# Patient Record
Sex: Female | Born: 1950
Health system: Southern US, Community
[De-identification: ages and names within clinical notes are randomized; demographics above are authoritative.]

## PROBLEM LIST (undated history)

## (undated) DIAGNOSIS — F32A Depression, unspecified: Secondary | ICD-10-CM

## (undated) DIAGNOSIS — K5900 Constipation, unspecified: Secondary | ICD-10-CM

## (undated) DIAGNOSIS — R5382 Chronic fatigue, unspecified: Secondary | ICD-10-CM

## (undated) DIAGNOSIS — K56609 Unspecified intestinal obstruction, unspecified as to partial versus complete obstruction: Secondary | ICD-10-CM

## (undated) DIAGNOSIS — F329 Major depressive disorder, single episode, unspecified: Secondary | ICD-10-CM

## (undated) DIAGNOSIS — M8580 Other specified disorders of bone density and structure, unspecified site: Secondary | ICD-10-CM

## (undated) DIAGNOSIS — M797 Fibromyalgia: Secondary | ICD-10-CM

## (undated) DIAGNOSIS — K219 Gastro-esophageal reflux disease without esophagitis: Secondary | ICD-10-CM

## (undated) DIAGNOSIS — K279 Peptic ulcer, site unspecified, unspecified as acute or chronic, without hemorrhage or perforation: Secondary | ICD-10-CM

## (undated) DIAGNOSIS — E785 Hyperlipidemia, unspecified: Secondary | ICD-10-CM

## (undated) HISTORY — DX: Gastro-esophageal reflux disease without esophagitis: K21.9

## (undated) HISTORY — PX: ABDOMINAL SURGERY: SHX537

## (undated) HISTORY — PX: EYE SURGERY: SHX253

## (undated) HISTORY — DX: Peptic ulcer, site unspecified, unspecified as acute or chronic, without hemorrhage or perforation: K27.9

## (undated) HISTORY — DX: Chronic fatigue, unspecified: R53.82

## (undated) HISTORY — DX: Major depressive disorder, single episode, unspecified: F32.9

## (undated) HISTORY — DX: Constipation, unspecified: K59.00

## (undated) HISTORY — DX: Other specified disorders of bone density and structure, unspecified site: M85.80

## (undated) HISTORY — DX: Hyperlipidemia, unspecified: E78.5

## (undated) HISTORY — DX: Depression, unspecified: F32.A

---

## 1984-03-14 HISTORY — PX: TOTAL ABDOMINAL HYSTERECTOMY W/ BILATERAL SALPINGOOPHORECTOMY: SHX83

## 1997-03-14 HISTORY — PX: BREAST BIOPSY: SHX20

## 1997-07-23 ENCOUNTER — Ambulatory Visit (HOSPITAL_COMMUNITY): Admission: RE | Admit: 1997-07-23 | Discharge: 1997-07-23 | Payer: Self-pay | Admitting: *Deleted

## 1997-07-28 ENCOUNTER — Ambulatory Visit (HOSPITAL_COMMUNITY): Admission: RE | Admit: 1997-07-28 | Discharge: 1997-07-28 | Payer: Self-pay | Admitting: *Deleted

## 1997-08-05 ENCOUNTER — Ambulatory Visit (HOSPITAL_COMMUNITY): Admission: RE | Admit: 1997-08-05 | Discharge: 1997-08-05 | Payer: Self-pay | Admitting: *Deleted

## 1997-08-18 ENCOUNTER — Ambulatory Visit (HOSPITAL_COMMUNITY): Admission: RE | Admit: 1997-08-18 | Discharge: 1997-08-18 | Payer: Self-pay | Admitting: *Deleted

## 1997-08-18 ENCOUNTER — Other Ambulatory Visit: Admission: RE | Admit: 1997-08-18 | Discharge: 1997-08-18 | Payer: Self-pay | Admitting: *Deleted

## 1998-01-05 ENCOUNTER — Ambulatory Visit (HOSPITAL_COMMUNITY): Admission: RE | Admit: 1998-01-05 | Discharge: 1998-01-05 | Payer: Self-pay | Admitting: *Deleted

## 1998-02-17 ENCOUNTER — Ambulatory Visit (HOSPITAL_COMMUNITY): Admission: RE | Admit: 1998-02-17 | Discharge: 1998-02-17 | Payer: Self-pay | Admitting: *Deleted

## 1998-03-27 ENCOUNTER — Ambulatory Visit (HOSPITAL_COMMUNITY): Admission: RE | Admit: 1998-03-27 | Discharge: 1998-03-27 | Payer: Self-pay | Admitting: *Deleted

## 1998-08-31 ENCOUNTER — Ambulatory Visit (HOSPITAL_COMMUNITY): Admission: RE | Admit: 1998-08-31 | Discharge: 1998-08-31 | Payer: Self-pay | Admitting: *Deleted

## 1999-02-02 ENCOUNTER — Ambulatory Visit (HOSPITAL_COMMUNITY): Admission: RE | Admit: 1999-02-02 | Discharge: 1999-02-02 | Payer: Self-pay | Admitting: Internal Medicine

## 1999-02-02 ENCOUNTER — Encounter: Payer: Self-pay | Admitting: Internal Medicine

## 1999-05-13 ENCOUNTER — Ambulatory Visit (HOSPITAL_COMMUNITY): Admission: RE | Admit: 1999-05-13 | Discharge: 1999-05-13 | Payer: Self-pay | Admitting: *Deleted

## 1999-09-16 ENCOUNTER — Other Ambulatory Visit: Admission: RE | Admit: 1999-09-16 | Discharge: 1999-09-16 | Payer: Self-pay | Admitting: *Deleted

## 1999-09-29 ENCOUNTER — Ambulatory Visit (HOSPITAL_COMMUNITY): Admission: RE | Admit: 1999-09-29 | Discharge: 1999-09-29 | Payer: Self-pay | Admitting: *Deleted

## 2000-02-10 ENCOUNTER — Emergency Department (HOSPITAL_COMMUNITY): Admission: EM | Admit: 2000-02-10 | Discharge: 2000-02-11 | Payer: Self-pay | Admitting: Emergency Medicine

## 2000-02-11 ENCOUNTER — Encounter: Payer: Self-pay | Admitting: Emergency Medicine

## 2000-03-27 ENCOUNTER — Encounter: Payer: Self-pay | Admitting: Internal Medicine

## 2000-03-27 ENCOUNTER — Ambulatory Visit (HOSPITAL_COMMUNITY): Admission: RE | Admit: 2000-03-27 | Discharge: 2000-03-27 | Payer: Self-pay | Admitting: Internal Medicine

## 2000-06-20 ENCOUNTER — Encounter: Payer: Self-pay | Admitting: Emergency Medicine

## 2000-06-20 ENCOUNTER — Encounter: Payer: Self-pay | Admitting: *Deleted

## 2000-06-20 ENCOUNTER — Inpatient Hospital Stay (HOSPITAL_COMMUNITY): Admission: EM | Admit: 2000-06-20 | Discharge: 2000-06-21 | Payer: Self-pay | Admitting: Emergency Medicine

## 2000-06-30 ENCOUNTER — Encounter: Payer: Self-pay | Admitting: Internal Medicine

## 2000-06-30 ENCOUNTER — Encounter: Admission: RE | Admit: 2000-06-30 | Discharge: 2000-06-30 | Payer: Self-pay | Admitting: Internal Medicine

## 2000-12-22 ENCOUNTER — Emergency Department (HOSPITAL_COMMUNITY): Admission: EM | Admit: 2000-12-22 | Discharge: 2000-12-22 | Payer: Self-pay | Admitting: Emergency Medicine

## 2001-01-22 ENCOUNTER — Encounter: Payer: Self-pay | Admitting: Internal Medicine

## 2001-01-22 ENCOUNTER — Ambulatory Visit (HOSPITAL_COMMUNITY): Admission: RE | Admit: 2001-01-22 | Discharge: 2001-01-22 | Payer: Self-pay | Admitting: Internal Medicine

## 2001-03-14 HISTORY — PX: BREAST CYST ASPIRATION: SHX578

## 2001-03-22 ENCOUNTER — Encounter: Admission: RE | Admit: 2001-03-22 | Discharge: 2001-03-22 | Payer: Self-pay | Admitting: Internal Medicine

## 2001-03-22 ENCOUNTER — Encounter: Payer: Self-pay | Admitting: Internal Medicine

## 2001-06-07 ENCOUNTER — Ambulatory Visit (HOSPITAL_BASED_OUTPATIENT_CLINIC_OR_DEPARTMENT_OTHER): Admission: RE | Admit: 2001-06-07 | Discharge: 2001-06-07 | Payer: Self-pay | Admitting: Internal Medicine

## 2001-07-16 ENCOUNTER — Encounter: Admission: RE | Admit: 2001-07-16 | Discharge: 2001-07-16 | Payer: Self-pay | Admitting: Internal Medicine

## 2001-07-16 ENCOUNTER — Other Ambulatory Visit: Admission: RE | Admit: 2001-07-16 | Discharge: 2001-07-16 | Payer: Self-pay | Admitting: Radiology

## 2001-07-16 ENCOUNTER — Encounter (INDEPENDENT_AMBULATORY_CARE_PROVIDER_SITE_OTHER): Payer: Self-pay | Admitting: *Deleted

## 2001-07-16 ENCOUNTER — Encounter: Payer: Self-pay | Admitting: Internal Medicine

## 2001-12-06 ENCOUNTER — Encounter: Payer: Self-pay | Admitting: Internal Medicine

## 2001-12-06 ENCOUNTER — Ambulatory Visit (HOSPITAL_COMMUNITY): Admission: RE | Admit: 2001-12-06 | Discharge: 2001-12-06 | Payer: Self-pay | Admitting: Internal Medicine

## 2002-03-14 HISTORY — PX: CHOLECYSTECTOMY: SHX55

## 2002-05-03 ENCOUNTER — Ambulatory Visit (HOSPITAL_COMMUNITY): Admission: RE | Admit: 2002-05-03 | Discharge: 2002-05-03 | Payer: Self-pay | Admitting: Internal Medicine

## 2002-05-03 ENCOUNTER — Encounter: Payer: Self-pay | Admitting: Internal Medicine

## 2002-05-20 ENCOUNTER — Encounter: Payer: Self-pay | Admitting: General Surgery

## 2002-05-22 ENCOUNTER — Ambulatory Visit (HOSPITAL_COMMUNITY): Admission: RE | Admit: 2002-05-22 | Discharge: 2002-05-23 | Payer: Self-pay | Admitting: General Surgery

## 2002-05-22 ENCOUNTER — Encounter: Payer: Self-pay | Admitting: General Surgery

## 2002-05-22 ENCOUNTER — Encounter (INDEPENDENT_AMBULATORY_CARE_PROVIDER_SITE_OTHER): Payer: Self-pay | Admitting: *Deleted

## 2002-06-25 ENCOUNTER — Ambulatory Visit (HOSPITAL_COMMUNITY): Admission: RE | Admit: 2002-06-25 | Discharge: 2002-06-25 | Payer: Self-pay | Admitting: Internal Medicine

## 2002-06-25 ENCOUNTER — Encounter: Payer: Self-pay | Admitting: Internal Medicine

## 2002-08-05 ENCOUNTER — Encounter: Admission: RE | Admit: 2002-08-05 | Discharge: 2002-08-05 | Payer: Self-pay | Admitting: Internal Medicine

## 2002-08-05 ENCOUNTER — Encounter: Payer: Self-pay | Admitting: Internal Medicine

## 2002-12-05 ENCOUNTER — Encounter: Admission: RE | Admit: 2002-12-05 | Discharge: 2002-12-05 | Payer: Self-pay | Admitting: *Deleted

## 2002-12-05 ENCOUNTER — Encounter: Payer: Self-pay | Admitting: *Deleted

## 2002-12-12 ENCOUNTER — Ambulatory Visit (HOSPITAL_COMMUNITY): Admission: RE | Admit: 2002-12-12 | Discharge: 2002-12-12 | Payer: Self-pay | Admitting: Internal Medicine

## 2002-12-12 ENCOUNTER — Encounter: Payer: Self-pay | Admitting: Internal Medicine

## 2003-01-27 ENCOUNTER — Encounter: Payer: Self-pay | Admitting: Internal Medicine

## 2003-01-27 ENCOUNTER — Ambulatory Visit (HOSPITAL_COMMUNITY): Admission: RE | Admit: 2003-01-27 | Discharge: 2003-01-27 | Payer: Self-pay | Admitting: Gastroenterology

## 2003-01-27 LAB — HM COLONOSCOPY: HM Colonoscopy: NORMAL

## 2003-03-15 HISTORY — PX: APPENDECTOMY: SHX54

## 2003-05-30 ENCOUNTER — Emergency Department (HOSPITAL_COMMUNITY): Admission: EM | Admit: 2003-05-30 | Discharge: 2003-05-30 | Payer: Self-pay | Admitting: Emergency Medicine

## 2003-06-17 ENCOUNTER — Encounter: Admission: RE | Admit: 2003-06-17 | Discharge: 2003-06-17 | Payer: Self-pay | Admitting: Gastroenterology

## 2003-06-19 ENCOUNTER — Ambulatory Visit (HOSPITAL_COMMUNITY): Admission: RE | Admit: 2003-06-19 | Discharge: 2003-06-19 | Payer: Self-pay | Admitting: Internal Medicine

## 2003-08-07 ENCOUNTER — Encounter: Admission: RE | Admit: 2003-08-07 | Discharge: 2003-08-07 | Payer: Self-pay | Admitting: Internal Medicine

## 2003-12-25 ENCOUNTER — Encounter: Admission: RE | Admit: 2003-12-25 | Discharge: 2003-12-25 | Payer: Self-pay | Admitting: Gastroenterology

## 2004-01-06 ENCOUNTER — Ambulatory Visit (HOSPITAL_COMMUNITY): Admission: RE | Admit: 2004-01-06 | Discharge: 2004-01-06 | Payer: Self-pay | Admitting: Gastroenterology

## 2004-01-08 ENCOUNTER — Encounter: Admission: RE | Admit: 2004-01-08 | Discharge: 2004-01-08 | Payer: Self-pay | Admitting: Gastroenterology

## 2004-02-13 ENCOUNTER — Ambulatory Visit: Admission: RE | Admit: 2004-02-13 | Discharge: 2004-02-13 | Payer: Self-pay | Admitting: General Surgery

## 2004-03-17 ENCOUNTER — Inpatient Hospital Stay (HOSPITAL_COMMUNITY): Admission: RE | Admit: 2004-03-17 | Discharge: 2004-03-20 | Payer: Self-pay | Admitting: General Surgery

## 2004-03-17 ENCOUNTER — Encounter (INDEPENDENT_AMBULATORY_CARE_PROVIDER_SITE_OTHER): Payer: Self-pay | Admitting: Specialist

## 2004-04-14 ENCOUNTER — Encounter
Admission: RE | Admit: 2004-04-14 | Discharge: 2004-07-13 | Payer: Self-pay | Admitting: Physical Medicine & Rehabilitation

## 2004-04-16 ENCOUNTER — Ambulatory Visit: Payer: Self-pay | Admitting: Physical Medicine & Rehabilitation

## 2004-05-17 ENCOUNTER — Encounter
Admission: RE | Admit: 2004-05-17 | Discharge: 2004-06-01 | Payer: Self-pay | Admitting: Physical Medicine & Rehabilitation

## 2004-07-01 ENCOUNTER — Encounter: Admission: RE | Admit: 2004-07-01 | Discharge: 2004-07-01 | Payer: Self-pay | Admitting: General Surgery

## 2004-08-06 ENCOUNTER — Encounter
Admission: RE | Admit: 2004-08-06 | Discharge: 2004-11-04 | Payer: Self-pay | Admitting: Physical Medicine & Rehabilitation

## 2004-08-10 ENCOUNTER — Ambulatory Visit: Payer: Self-pay | Admitting: Physical Medicine & Rehabilitation

## 2004-08-17 ENCOUNTER — Encounter: Admission: RE | Admit: 2004-08-17 | Discharge: 2004-08-17 | Payer: Self-pay | Admitting: Family Medicine

## 2005-06-14 ENCOUNTER — Encounter: Admission: RE | Admit: 2005-06-14 | Discharge: 2005-06-14 | Payer: Self-pay | Admitting: Gastroenterology

## 2005-06-16 ENCOUNTER — Inpatient Hospital Stay (HOSPITAL_COMMUNITY): Admission: EM | Admit: 2005-06-16 | Discharge: 2005-06-18 | Payer: Self-pay | Admitting: Emergency Medicine

## 2005-08-22 ENCOUNTER — Ambulatory Visit: Payer: Self-pay | Admitting: Family Medicine

## 2005-09-07 ENCOUNTER — Encounter: Admission: RE | Admit: 2005-09-07 | Discharge: 2005-09-07 | Payer: Self-pay | Admitting: Family Medicine

## 2005-09-19 ENCOUNTER — Ambulatory Visit: Payer: Self-pay | Admitting: Family Medicine

## 2005-09-26 ENCOUNTER — Encounter: Admission: RE | Admit: 2005-09-26 | Discharge: 2005-09-26 | Payer: Self-pay | Admitting: Family Medicine

## 2005-09-30 ENCOUNTER — Ambulatory Visit (HOSPITAL_COMMUNITY): Payer: Self-pay | Admitting: Psychiatry

## 2005-10-14 ENCOUNTER — Ambulatory Visit: Payer: Self-pay | Admitting: Family Medicine

## 2005-10-28 ENCOUNTER — Ambulatory Visit: Payer: Self-pay | Admitting: Family Medicine

## 2005-11-01 ENCOUNTER — Encounter: Payer: Self-pay | Admitting: Emergency Medicine

## 2005-11-01 ENCOUNTER — Inpatient Hospital Stay (HOSPITAL_COMMUNITY): Admission: AD | Admit: 2005-11-01 | Discharge: 2005-11-03 | Payer: Self-pay | Admitting: Family Medicine

## 2005-11-01 ENCOUNTER — Ambulatory Visit: Payer: Self-pay | Admitting: Family Medicine

## 2005-11-02 ENCOUNTER — Encounter: Payer: Self-pay | Admitting: Vascular Surgery

## 2005-11-09 ENCOUNTER — Ambulatory Visit (HOSPITAL_COMMUNITY): Payer: Self-pay | Admitting: Psychiatry

## 2005-11-17 ENCOUNTER — Ambulatory Visit: Payer: Self-pay | Admitting: Family Medicine

## 2005-11-29 ENCOUNTER — Ambulatory Visit: Payer: Self-pay | Admitting: Family Medicine

## 2005-11-30 ENCOUNTER — Ambulatory Visit: Payer: Self-pay | Admitting: Family Medicine

## 2005-12-06 ENCOUNTER — Ambulatory Visit: Payer: Self-pay | Admitting: Family Medicine

## 2005-12-07 ENCOUNTER — Ambulatory Visit (HOSPITAL_COMMUNITY): Payer: Self-pay | Admitting: Psychiatry

## 2005-12-19 ENCOUNTER — Ambulatory Visit: Payer: Self-pay | Admitting: Family Medicine

## 2005-12-20 DIAGNOSIS — E785 Hyperlipidemia, unspecified: Secondary | ICD-10-CM | POA: Insufficient documentation

## 2005-12-20 DIAGNOSIS — F410 Panic disorder [episodic paroxysmal anxiety] without agoraphobia: Secondary | ICD-10-CM | POA: Insufficient documentation

## 2005-12-20 DIAGNOSIS — J309 Allergic rhinitis, unspecified: Secondary | ICD-10-CM | POA: Insufficient documentation

## 2005-12-20 DIAGNOSIS — G43909 Migraine, unspecified, not intractable, without status migrainosus: Secondary | ICD-10-CM

## 2005-12-20 DIAGNOSIS — G43009 Migraine without aura, not intractable, without status migrainosus: Secondary | ICD-10-CM | POA: Insufficient documentation

## 2005-12-20 DIAGNOSIS — N951 Menopausal and female climacteric states: Secondary | ICD-10-CM | POA: Insufficient documentation

## 2006-01-04 ENCOUNTER — Ambulatory Visit (HOSPITAL_COMMUNITY): Payer: Self-pay | Admitting: Psychiatry

## 2006-01-13 ENCOUNTER — Ambulatory Visit: Payer: Self-pay | Admitting: Internal Medicine

## 2006-02-13 ENCOUNTER — Ambulatory Visit: Payer: Self-pay | Admitting: Internal Medicine

## 2006-04-12 ENCOUNTER — Ambulatory Visit: Payer: Self-pay | Admitting: Internal Medicine

## 2006-05-11 ENCOUNTER — Ambulatory Visit: Payer: Self-pay | Admitting: Family Medicine

## 2006-05-18 ENCOUNTER — Ambulatory Visit: Payer: Self-pay | Admitting: Internal Medicine

## 2006-05-19 LAB — CONVERTED CEMR LAB
ALT: 14 units/L (ref 0–40)
AST: 18 units/L (ref 0–37)
Albumin: 3.8 g/dL (ref 3.5–5.2)
Alkaline Phosphatase: 93 units/L (ref 39–117)
BUN: 11 mg/dL (ref 6–23)
Basophils Absolute: 0.1 10*3/uL (ref 0.0–0.1)
Basophils Relative: 1.1 % — ABNORMAL HIGH (ref 0.0–1.0)
Bilirubin, Direct: 0.1 mg/dL (ref 0.0–0.3)
CO2: 31 meq/L (ref 19–32)
Calcium: 9.9 mg/dL (ref 8.4–10.5)
Chloride: 102 meq/L (ref 96–112)
Creatinine, Ser: 0.7 mg/dL (ref 0.4–1.2)
Eosinophils Absolute: 0.2 10*3/uL (ref 0.0–0.6)
Eosinophils Relative: 2.4 % (ref 0.0–5.0)
GFR calc Af Amer: 111 mL/min
GFR calc non Af Amer: 92 mL/min
Glucose, Bld: 67 mg/dL — ABNORMAL LOW (ref 70–99)
HCT: 38.2 % (ref 36.0–46.0)
Hemoglobin: 13.5 g/dL (ref 12.0–15.0)
Lymphocytes Relative: 25.8 % (ref 12.0–46.0)
MCHC: 35.4 g/dL (ref 30.0–36.0)
MCV: 91.8 fL (ref 78.0–100.0)
Monocytes Absolute: 0.1 10*3/uL — ABNORMAL LOW (ref 0.2–0.7)
Monocytes Relative: 1.6 % — ABNORMAL LOW (ref 3.0–11.0)
Neutro Abs: 5.9 10*3/uL (ref 1.4–7.7)
Neutrophils Relative %: 69.1 % (ref 43.0–77.0)
Phosphorus: 4.3 mg/dL (ref 2.3–4.6)
Platelets: 247 10*3/uL (ref 150–400)
Potassium: 3.9 meq/L (ref 3.5–5.1)
RBC: 4.16 M/uL (ref 3.87–5.11)
RDW: 11.5 % (ref 11.5–14.6)
Sodium: 140 meq/L (ref 135–145)
TSH: 1.51 microintl units/mL (ref 0.35–5.50)
Total Bilirubin: 0.5 mg/dL (ref 0.3–1.2)
Total Protein: 7 g/dL (ref 6.0–8.3)
WBC: 8.5 10*3/uL (ref 4.5–10.5)

## 2006-05-23 ENCOUNTER — Ambulatory Visit: Payer: Self-pay | Admitting: Professional

## 2006-06-01 ENCOUNTER — Ambulatory Visit: Payer: Self-pay | Admitting: Professional

## 2006-06-01 ENCOUNTER — Ambulatory Visit: Payer: Self-pay | Admitting: Internal Medicine

## 2006-06-15 ENCOUNTER — Ambulatory Visit: Payer: Self-pay | Admitting: Professional

## 2006-06-28 ENCOUNTER — Ambulatory Visit: Payer: Self-pay | Admitting: Internal Medicine

## 2006-08-08 ENCOUNTER — Telehealth (INDEPENDENT_AMBULATORY_CARE_PROVIDER_SITE_OTHER): Payer: Self-pay | Admitting: *Deleted

## 2006-08-15 DIAGNOSIS — K219 Gastro-esophageal reflux disease without esophagitis: Secondary | ICD-10-CM | POA: Insufficient documentation

## 2006-08-15 DIAGNOSIS — R5383 Other fatigue: Secondary | ICD-10-CM

## 2006-08-15 DIAGNOSIS — G479 Sleep disorder, unspecified: Secondary | ICD-10-CM | POA: Insufficient documentation

## 2006-08-15 DIAGNOSIS — R5381 Other malaise: Secondary | ICD-10-CM | POA: Insufficient documentation

## 2006-08-15 DIAGNOSIS — M797 Fibromyalgia: Secondary | ICD-10-CM | POA: Insufficient documentation

## 2006-08-15 DIAGNOSIS — F39 Unspecified mood [affective] disorder: Secondary | ICD-10-CM | POA: Insufficient documentation

## 2006-08-16 ENCOUNTER — Ambulatory Visit: Payer: Self-pay | Admitting: Internal Medicine

## 2006-09-05 ENCOUNTER — Ambulatory Visit: Payer: Self-pay | Admitting: Internal Medicine

## 2006-09-28 ENCOUNTER — Encounter: Admission: RE | Admit: 2006-09-28 | Discharge: 2006-09-28 | Payer: Self-pay | Admitting: Internal Medicine

## 2006-10-02 ENCOUNTER — Encounter (INDEPENDENT_AMBULATORY_CARE_PROVIDER_SITE_OTHER): Payer: Self-pay | Admitting: *Deleted

## 2006-10-02 ENCOUNTER — Encounter
Admission: RE | Admit: 2006-10-02 | Discharge: 2006-10-31 | Payer: Self-pay | Admitting: Physical Medicine & Rehabilitation

## 2006-10-03 ENCOUNTER — Ambulatory Visit: Payer: Self-pay | Admitting: Physical Medicine & Rehabilitation

## 2006-10-05 ENCOUNTER — Ambulatory Visit (HOSPITAL_COMMUNITY)
Admission: RE | Admit: 2006-10-05 | Discharge: 2006-10-05 | Payer: Self-pay | Admitting: Physical Medicine & Rehabilitation

## 2006-10-05 ENCOUNTER — Encounter (INDEPENDENT_AMBULATORY_CARE_PROVIDER_SITE_OTHER): Payer: Self-pay | Admitting: *Deleted

## 2006-10-13 ENCOUNTER — Ambulatory Visit: Payer: Self-pay | Admitting: Internal Medicine

## 2006-10-26 ENCOUNTER — Encounter: Payer: Self-pay | Admitting: Internal Medicine

## 2006-11-20 ENCOUNTER — Ambulatory Visit: Payer: Self-pay | Admitting: Internal Medicine

## 2006-11-24 ENCOUNTER — Ambulatory Visit: Payer: Self-pay | Admitting: Internal Medicine

## 2006-11-24 ENCOUNTER — Telehealth: Payer: Self-pay | Admitting: Internal Medicine

## 2006-11-27 ENCOUNTER — Ambulatory Visit: Payer: Self-pay | Admitting: Internal Medicine

## 2006-12-04 ENCOUNTER — Encounter: Payer: Self-pay | Admitting: Internal Medicine

## 2006-12-04 ENCOUNTER — Ambulatory Visit: Payer: Self-pay

## 2006-12-13 ENCOUNTER — Telehealth (INDEPENDENT_AMBULATORY_CARE_PROVIDER_SITE_OTHER): Payer: Self-pay | Admitting: *Deleted

## 2006-12-15 ENCOUNTER — Ambulatory Visit: Payer: Self-pay | Admitting: Internal Medicine

## 2007-01-01 ENCOUNTER — Telehealth (INDEPENDENT_AMBULATORY_CARE_PROVIDER_SITE_OTHER): Payer: Self-pay | Admitting: *Deleted

## 2007-01-12 ENCOUNTER — Telehealth: Payer: Self-pay | Admitting: Internal Medicine

## 2007-02-12 ENCOUNTER — Telehealth (INDEPENDENT_AMBULATORY_CARE_PROVIDER_SITE_OTHER): Payer: Self-pay | Admitting: *Deleted

## 2007-02-19 ENCOUNTER — Ambulatory Visit: Payer: Self-pay | Admitting: Internal Medicine

## 2007-02-19 LAB — CONVERTED CEMR LAB
Bilirubin Urine: NEGATIVE
Blood in Urine, dipstick: NEGATIVE
Glucose, Urine, Semiquant: NEGATIVE
Ketones, urine, test strip: NEGATIVE
Nitrite: NEGATIVE
Protein, U semiquant: NEGATIVE
Specific Gravity, Urine: <1.005
Urobilinogen, UA: 0.2
WBC Urine, dipstick: NEGATIVE
Whiff Test: NEGATIVE
pH: 7

## 2007-02-22 ENCOUNTER — Telehealth: Payer: Self-pay | Admitting: Internal Medicine

## 2007-03-12 ENCOUNTER — Telehealth: Payer: Self-pay | Admitting: Family Medicine

## 2007-04-12 ENCOUNTER — Telehealth (INDEPENDENT_AMBULATORY_CARE_PROVIDER_SITE_OTHER): Payer: Self-pay | Admitting: *Deleted

## 2007-04-23 ENCOUNTER — Telehealth (INDEPENDENT_AMBULATORY_CARE_PROVIDER_SITE_OTHER): Payer: Self-pay | Admitting: *Deleted

## 2007-05-16 ENCOUNTER — Telehealth (INDEPENDENT_AMBULATORY_CARE_PROVIDER_SITE_OTHER): Payer: Self-pay | Admitting: *Deleted

## 2007-05-28 ENCOUNTER — Ambulatory Visit: Payer: Self-pay | Admitting: Internal Medicine

## 2007-06-01 ENCOUNTER — Telehealth (INDEPENDENT_AMBULATORY_CARE_PROVIDER_SITE_OTHER): Payer: Self-pay | Admitting: *Deleted

## 2007-06-15 ENCOUNTER — Telehealth (INDEPENDENT_AMBULATORY_CARE_PROVIDER_SITE_OTHER): Payer: Self-pay | Admitting: *Deleted

## 2007-07-11 ENCOUNTER — Telehealth (INDEPENDENT_AMBULATORY_CARE_PROVIDER_SITE_OTHER): Payer: Self-pay | Admitting: *Deleted

## 2007-07-13 ENCOUNTER — Telehealth (INDEPENDENT_AMBULATORY_CARE_PROVIDER_SITE_OTHER): Payer: Self-pay | Admitting: *Deleted

## 2007-07-13 ENCOUNTER — Ambulatory Visit: Payer: Self-pay | Admitting: Internal Medicine

## 2007-08-02 ENCOUNTER — Encounter: Payer: Self-pay | Admitting: Internal Medicine

## 2007-08-13 ENCOUNTER — Telehealth (INDEPENDENT_AMBULATORY_CARE_PROVIDER_SITE_OTHER): Payer: Self-pay | Admitting: *Deleted

## 2007-08-24 ENCOUNTER — Telehealth: Payer: Self-pay | Admitting: Family Medicine

## 2007-08-30 ENCOUNTER — Ambulatory Visit: Payer: Self-pay | Admitting: Internal Medicine

## 2007-09-03 ENCOUNTER — Ambulatory Visit: Payer: Self-pay | Admitting: Family Medicine

## 2007-09-03 LAB — CONVERTED CEMR LAB
Albumin: 3.7 g/dL (ref 3.5–5.2)
BUN: 7 mg/dL (ref 6–23)
Basophils Absolute: 0 10*3/uL (ref 0.0–0.1)
Basophils Relative: 0.3 % (ref 0.0–1.0)
CO2: 29 meq/L (ref 19–32)
Calcium: 9.8 mg/dL (ref 8.4–10.5)
Chloride: 105 meq/L (ref 96–112)
Cholesterol: 209 mg/dL (ref 0–200)
Creatinine, Ser: 0.7 mg/dL (ref 0.4–1.2)
Direct LDL: 138.4 mg/dL
Eosinophils Absolute: 0 10*3/uL (ref 0.0–0.7)
Eosinophils Relative: 0.1 % (ref 0.0–5.0)
GFR calc Af Amer: 110 mL/min
GFR calc non Af Amer: 91 mL/min
Glucose, Bld: 121 mg/dL — ABNORMAL HIGH (ref 70–99)
HCT: 35.6 % — ABNORMAL LOW (ref 36.0–46.0)
HDL: 53.7 mg/dL (ref >39.0)
Hemoglobin: 12.5 g/dL (ref 12.0–15.0)
Lymphocytes Relative: 4.4 % — ABNORMAL LOW (ref 12.0–46.0)
MCHC: 35 g/dL (ref 30.0–36.0)
MCV: 98.5 fL (ref 78.0–100.0)
Monocytes Absolute: 0.4 10*3/uL (ref 0.1–1.0)
Monocytes Relative: 2.9 % — ABNORMAL LOW (ref 3.0–12.0)
Neutro Abs: 12 10*3/uL — ABNORMAL HIGH (ref 1.4–7.7)
Neutrophils Relative %: 92.3 % — ABNORMAL HIGH (ref 43.0–77.0)
Phosphorus: 2.7 mg/dL (ref 2.3–4.6)
Platelets: 300 10*3/uL (ref 150–400)
Potassium: 4.5 meq/L (ref 3.5–5.1)
RBC: 3.61 M/uL — ABNORMAL LOW (ref 3.87–5.11)
RDW: 11.8 % (ref 11.5–14.6)
Sodium: 139 meq/L (ref 135–145)
TSH: 0.78 microintl units/mL (ref 0.35–5.50)
Total CHOL/HDL Ratio: 3.9
Triglycerides: 55 mg/dL (ref 0–149)
VLDL: 11 mg/dL (ref 0–40)
WBC: 13 10*3/uL — ABNORMAL HIGH (ref 4.5–10.5)

## 2007-09-07 ENCOUNTER — Telehealth: Payer: Self-pay | Admitting: Family Medicine

## 2007-09-11 ENCOUNTER — Telehealth (INDEPENDENT_AMBULATORY_CARE_PROVIDER_SITE_OTHER): Payer: Self-pay | Admitting: *Deleted

## 2007-10-01 ENCOUNTER — Encounter: Admission: RE | Admit: 2007-10-01 | Discharge: 2007-10-01 | Payer: Self-pay | Admitting: Internal Medicine

## 2007-10-02 ENCOUNTER — Encounter (INDEPENDENT_AMBULATORY_CARE_PROVIDER_SITE_OTHER): Payer: Self-pay | Admitting: *Deleted

## 2007-10-02 ENCOUNTER — Telehealth (INDEPENDENT_AMBULATORY_CARE_PROVIDER_SITE_OTHER): Payer: Self-pay | Admitting: *Deleted

## 2007-10-08 ENCOUNTER — Ambulatory Visit: Payer: Self-pay | Admitting: Internal Medicine

## 2007-10-08 LAB — CONVERTED CEMR LAB: RBC / HPF: 0

## 2007-10-09 ENCOUNTER — Telehealth (INDEPENDENT_AMBULATORY_CARE_PROVIDER_SITE_OTHER): Payer: Self-pay | Admitting: *Deleted

## 2007-10-30 ENCOUNTER — Telehealth: Payer: Self-pay | Admitting: Internal Medicine

## 2007-11-07 ENCOUNTER — Telehealth: Payer: Self-pay | Admitting: Internal Medicine

## 2007-11-26 ENCOUNTER — Telehealth: Payer: Self-pay | Admitting: Internal Medicine

## 2007-11-26 ENCOUNTER — Telehealth: Payer: Self-pay | Admitting: Family Medicine

## 2007-12-05 ENCOUNTER — Telehealth: Payer: Self-pay | Admitting: Internal Medicine

## 2007-12-27 ENCOUNTER — Telehealth: Payer: Self-pay | Admitting: Internal Medicine

## 2008-01-01 ENCOUNTER — Telehealth: Payer: Self-pay | Admitting: Internal Medicine

## 2008-01-11 ENCOUNTER — Ambulatory Visit: Payer: Self-pay | Admitting: Internal Medicine

## 2008-01-29 ENCOUNTER — Telehealth: Payer: Self-pay | Admitting: Internal Medicine

## 2008-01-31 ENCOUNTER — Telehealth: Payer: Self-pay | Admitting: Internal Medicine

## 2008-02-27 ENCOUNTER — Telehealth: Payer: Self-pay | Admitting: Internal Medicine

## 2008-02-28 ENCOUNTER — Telehealth: Payer: Self-pay | Admitting: Internal Medicine

## 2008-03-10 ENCOUNTER — Telehealth: Payer: Self-pay | Admitting: Internal Medicine

## 2008-03-25 ENCOUNTER — Telehealth: Payer: Self-pay | Admitting: Internal Medicine

## 2008-04-07 ENCOUNTER — Ambulatory Visit: Payer: Self-pay | Admitting: Internal Medicine

## 2008-04-08 LAB — CONVERTED CEMR LAB
ALT: 19 units/L (ref 0–35)
AST: 24 units/L (ref 0–37)
Albumin: 4.4 g/dL (ref 3.5–5.2)
Alkaline Phosphatase: 84 units/L (ref 39–117)
BUN: 12 mg/dL (ref 6–23)
Basophils Absolute: 0 10*3/uL (ref 0.0–0.1)
Basophils Relative: 0.4 % (ref 0.0–3.0)
Bilirubin, Direct: 0.1 mg/dL (ref 0.0–0.3)
CO2: 32 meq/L (ref 19–32)
Calcium: 10.1 mg/dL (ref 8.4–10.5)
Chloride: 107 meq/L (ref 96–112)
Creatinine, Ser: 0.8 mg/dL (ref 0.4–1.2)
Eosinophils Absolute: 0.2 10*3/uL (ref 0.0–0.7)
Eosinophils Relative: 2.7 % (ref 0.0–5.0)
GFR calc Af Amer: 94 mL/min
GFR calc non Af Amer: 78 mL/min
Glucose, Bld: 88 mg/dL (ref 70–99)
HCT: 39.2 % (ref 36.0–46.0)
Hemoglobin: 13.5 g/dL (ref 12.0–15.0)
Lymphocytes Relative: 38.6 % (ref 12.0–46.0)
MCHC: 34.4 g/dL (ref 30.0–36.0)
MCV: 96.2 fL (ref 78.0–100.0)
Monocytes Absolute: 0.4 10*3/uL (ref 0.1–1.0)
Monocytes Relative: 6 % (ref 3.0–12.0)
Neutro Abs: 3.8 10*3/uL (ref 1.4–7.7)
Neutrophils Relative %: 52.3 % (ref 43.0–77.0)
Phosphorus: 5 mg/dL — ABNORMAL HIGH (ref 2.3–4.6)
Platelets: 220 10*3/uL (ref 150–400)
Potassium: 4.2 meq/L (ref 3.5–5.1)
RBC: 4.08 M/uL (ref 3.87–5.11)
RDW: 11.6 % (ref 11.5–14.6)
Sed Rate: 15 mm/hr (ref 0–22)
Sodium: 143 meq/L (ref 135–145)
TSH: 1.74 microintl units/mL (ref 0.35–5.50)
Total Bilirubin: 0.7 mg/dL (ref 0.3–1.2)
Total Protein: 7.6 g/dL (ref 6.0–8.3)
WBC: 7.1 10*3/uL (ref 4.5–10.5)

## 2008-04-10 ENCOUNTER — Encounter: Payer: Self-pay | Admitting: Internal Medicine

## 2008-04-25 ENCOUNTER — Telehealth: Payer: Self-pay | Admitting: Internal Medicine

## 2008-04-30 ENCOUNTER — Emergency Department (HOSPITAL_COMMUNITY): Admission: EM | Admit: 2008-04-30 | Discharge: 2008-04-30 | Payer: Self-pay | Admitting: Emergency Medicine

## 2008-05-26 ENCOUNTER — Telehealth: Payer: Self-pay | Admitting: Family Medicine

## 2008-06-19 ENCOUNTER — Ambulatory Visit: Payer: Self-pay | Admitting: Internal Medicine

## 2008-06-27 ENCOUNTER — Telehealth: Payer: Self-pay | Admitting: Internal Medicine

## 2008-07-08 ENCOUNTER — Telehealth: Payer: Self-pay | Admitting: Internal Medicine

## 2008-07-10 ENCOUNTER — Ambulatory Visit: Payer: Self-pay | Admitting: Family Medicine

## 2008-07-15 ENCOUNTER — Telehealth: Payer: Self-pay | Admitting: Internal Medicine

## 2008-07-16 ENCOUNTER — Telehealth: Payer: Self-pay | Admitting: Internal Medicine

## 2008-07-17 ENCOUNTER — Ambulatory Visit: Payer: Self-pay | Admitting: Family Medicine

## 2008-07-24 ENCOUNTER — Telehealth: Payer: Self-pay | Admitting: Internal Medicine

## 2008-07-25 ENCOUNTER — Telehealth (INDEPENDENT_AMBULATORY_CARE_PROVIDER_SITE_OTHER): Payer: Self-pay | Admitting: *Deleted

## 2008-07-25 ENCOUNTER — Ambulatory Visit: Payer: Self-pay | Admitting: Family Medicine

## 2008-08-04 ENCOUNTER — Ambulatory Visit: Payer: Self-pay | Admitting: Family Medicine

## 2008-08-05 ENCOUNTER — Telehealth: Payer: Self-pay | Admitting: Family Medicine

## 2008-08-26 ENCOUNTER — Ambulatory Visit: Payer: Self-pay | Admitting: Internal Medicine

## 2008-08-27 ENCOUNTER — Telehealth: Payer: Self-pay | Admitting: Internal Medicine

## 2008-09-21 ENCOUNTER — Observation Stay (HOSPITAL_COMMUNITY): Admission: EM | Admit: 2008-09-21 | Discharge: 2008-09-22 | Payer: Self-pay | Admitting: Emergency Medicine

## 2008-09-22 ENCOUNTER — Ambulatory Visit: Payer: Self-pay | Admitting: *Deleted

## 2008-09-22 ENCOUNTER — Encounter: Payer: Self-pay | Admitting: Internal Medicine

## 2008-09-24 ENCOUNTER — Telehealth: Payer: Self-pay | Admitting: Internal Medicine

## 2008-10-02 ENCOUNTER — Encounter: Payer: Self-pay | Admitting: Internal Medicine

## 2008-10-02 ENCOUNTER — Encounter: Admission: RE | Admit: 2008-10-02 | Discharge: 2008-10-02 | Payer: Self-pay | Admitting: Family Medicine

## 2008-10-03 ENCOUNTER — Encounter (INDEPENDENT_AMBULATORY_CARE_PROVIDER_SITE_OTHER): Payer: Self-pay | Admitting: *Deleted

## 2008-10-23 ENCOUNTER — Telehealth: Payer: Self-pay | Admitting: Internal Medicine

## 2008-11-10 ENCOUNTER — Ambulatory Visit: Payer: Self-pay | Admitting: Internal Medicine

## 2008-11-11 LAB — CONVERTED CEMR LAB
ALT: 13 units/L (ref 0–35)
AST: 24 units/L (ref 0–37)
Albumin: 4.4 g/dL (ref 3.5–5.2)
Alkaline Phosphatase: 95 units/L (ref 39–117)
BUN: 10 mg/dL (ref 6–23)
Basophils Absolute: 0 10*3/uL (ref 0.0–0.1)
Basophils Relative: 0.5 % (ref 0.0–3.0)
Bilirubin, Direct: 0.1 mg/dL (ref 0.0–0.3)
CO2: 31 meq/L (ref 19–32)
Calcium: 9.7 mg/dL (ref 8.4–10.5)
Chloride: 103 meq/L (ref 96–112)
Creatinine, Ser: 0.8 mg/dL (ref 0.4–1.2)
Eosinophils Absolute: 0.2 10*3/uL (ref 0.0–0.7)
Eosinophils Relative: 3 % (ref 0.0–5.0)
Glucose, Bld: 92 mg/dL (ref 70–99)
HCT: 38.9 % (ref 36.0–46.0)
Hemoglobin: 13.7 g/dL (ref 12.0–15.0)
Lymphocytes Relative: 32.5 % (ref 12.0–46.0)
Lymphs Abs: 2.6 10*3/uL (ref 0.7–4.0)
MCHC: 35.3 g/dL (ref 30.0–36.0)
MCV: 95.8 fL (ref 78.0–100.0)
Monocytes Absolute: 0.5 10*3/uL (ref 0.1–1.0)
Monocytes Relative: 6.5 % (ref 3.0–12.0)
Neutro Abs: 4.7 10*3/uL (ref 1.4–7.7)
Neutrophils Relative %: 57.5 % (ref 43.0–77.0)
Phosphorus: 4.5 mg/dL (ref 2.3–4.6)
Platelets: 236 10*3/uL (ref 150.0–400.0)
Potassium: 4.2 meq/L (ref 3.5–5.1)
RBC: 4.07 M/uL (ref 3.87–5.11)
RDW: 11.6 % (ref 11.5–14.6)
Sed Rate: 21 mm/hr (ref 0–22)
Sodium: 140 meq/L (ref 135–145)
TSH: 1.2 microintl units/mL (ref 0.35–5.50)
Total Bilirubin: 0.9 mg/dL (ref 0.3–1.2)
Total Protein: 7.9 g/dL (ref 6.0–8.3)
WBC: 8 10*3/uL (ref 4.5–10.5)

## 2008-11-19 ENCOUNTER — Telehealth: Payer: Self-pay | Admitting: Internal Medicine

## 2008-11-27 ENCOUNTER — Telehealth: Payer: Self-pay | Admitting: Internal Medicine

## 2008-11-28 ENCOUNTER — Telehealth: Payer: Self-pay | Admitting: Internal Medicine

## 2008-12-25 ENCOUNTER — Telehealth: Payer: Self-pay | Admitting: Internal Medicine

## 2009-01-22 ENCOUNTER — Telehealth: Payer: Self-pay | Admitting: Internal Medicine

## 2009-02-09 ENCOUNTER — Telehealth: Payer: Self-pay | Admitting: Internal Medicine

## 2009-02-25 ENCOUNTER — Telehealth: Payer: Self-pay | Admitting: Internal Medicine

## 2009-03-03 ENCOUNTER — Telehealth: Payer: Self-pay | Admitting: Internal Medicine

## 2009-03-10 ENCOUNTER — Ambulatory Visit: Payer: Self-pay | Admitting: Internal Medicine

## 2009-03-10 DIAGNOSIS — M899 Disorder of bone, unspecified: Secondary | ICD-10-CM | POA: Insufficient documentation

## 2009-03-10 DIAGNOSIS — M949 Disorder of cartilage, unspecified: Secondary | ICD-10-CM

## 2009-03-26 ENCOUNTER — Telehealth: Payer: Self-pay | Admitting: Internal Medicine

## 2009-04-10 ENCOUNTER — Ambulatory Visit: Payer: Self-pay | Admitting: Family Medicine

## 2009-04-13 ENCOUNTER — Ambulatory Visit: Payer: Self-pay | Admitting: Occupational Medicine

## 2009-04-13 ENCOUNTER — Telehealth: Payer: Self-pay | Admitting: Internal Medicine

## 2009-04-20 ENCOUNTER — Ambulatory Visit: Payer: Self-pay | Admitting: Occupational Medicine

## 2009-04-24 ENCOUNTER — Telehealth: Payer: Self-pay | Admitting: Internal Medicine

## 2009-04-27 ENCOUNTER — Telehealth: Payer: Self-pay | Admitting: Internal Medicine

## 2009-04-30 ENCOUNTER — Ambulatory Visit: Payer: Self-pay | Admitting: Family Medicine

## 2009-05-06 ENCOUNTER — Encounter: Admission: RE | Admit: 2009-05-06 | Discharge: 2009-05-06 | Payer: Self-pay | Admitting: Sports Medicine

## 2009-05-11 ENCOUNTER — Encounter: Admission: RE | Admit: 2009-05-11 | Discharge: 2009-06-04 | Payer: Self-pay | Admitting: Sports Medicine

## 2009-05-25 ENCOUNTER — Telehealth: Payer: Self-pay | Admitting: Internal Medicine

## 2009-06-25 ENCOUNTER — Telehealth: Payer: Self-pay | Admitting: Internal Medicine

## 2009-06-26 ENCOUNTER — Ambulatory Visit: Payer: Self-pay | Admitting: Internal Medicine

## 2009-06-26 LAB — CONVERTED CEMR LAB
Bilirubin Urine: NEGATIVE
Glucose, Urine, Semiquant: NEGATIVE
Ketones, urine, test strip: NEGATIVE
Nitrite: NEGATIVE
Protein, U semiquant: NEGATIVE
Specific Gravity, Urine: <1.005
Urobilinogen, UA: 0.2
pH: 6

## 2009-07-24 ENCOUNTER — Telehealth: Payer: Self-pay | Admitting: Internal Medicine

## 2009-08-25 ENCOUNTER — Telehealth: Payer: Self-pay | Admitting: Internal Medicine

## 2009-09-01 ENCOUNTER — Encounter: Payer: Self-pay | Admitting: Internal Medicine

## 2009-09-24 ENCOUNTER — Telehealth: Payer: Self-pay | Admitting: Internal Medicine

## 2009-10-07 ENCOUNTER — Encounter: Admission: RE | Admit: 2009-10-07 | Discharge: 2009-10-07 | Payer: Self-pay | Admitting: Internal Medicine

## 2009-10-07 LAB — HM MAMMOGRAPHY

## 2009-10-08 ENCOUNTER — Encounter: Payer: Self-pay | Admitting: Internal Medicine

## 2009-10-08 ENCOUNTER — Encounter (INDEPENDENT_AMBULATORY_CARE_PROVIDER_SITE_OTHER): Payer: Self-pay | Admitting: *Deleted

## 2009-10-21 ENCOUNTER — Encounter: Payer: Self-pay | Admitting: Internal Medicine

## 2009-10-22 ENCOUNTER — Ambulatory Visit: Payer: Self-pay | Admitting: Internal Medicine

## 2009-11-12 ENCOUNTER — Encounter: Payer: Self-pay | Admitting: Internal Medicine

## 2009-11-24 ENCOUNTER — Telehealth: Payer: Self-pay | Admitting: Internal Medicine

## 2009-12-04 ENCOUNTER — Telehealth: Payer: Self-pay | Admitting: Internal Medicine

## 2009-12-23 ENCOUNTER — Telehealth: Payer: Self-pay | Admitting: Internal Medicine

## 2010-01-04 ENCOUNTER — Telehealth: Payer: Self-pay | Admitting: Internal Medicine

## 2010-01-26 ENCOUNTER — Telehealth: Payer: Self-pay | Admitting: Internal Medicine

## 2010-02-15 ENCOUNTER — Telehealth: Payer: Self-pay | Admitting: Internal Medicine

## 2010-02-15 ENCOUNTER — Ambulatory Visit: Payer: Self-pay | Admitting: Family Medicine

## 2010-02-17 ENCOUNTER — Telehealth (INDEPENDENT_AMBULATORY_CARE_PROVIDER_SITE_OTHER): Payer: Self-pay

## 2010-02-18 ENCOUNTER — Ambulatory Visit: Payer: Self-pay | Admitting: Internal Medicine

## 2010-02-23 ENCOUNTER — Telehealth: Payer: Self-pay | Admitting: Internal Medicine

## 2010-03-24 ENCOUNTER — Telehealth: Payer: Self-pay | Admitting: Internal Medicine

## 2010-03-25 ENCOUNTER — Ambulatory Visit
Admission: RE | Admit: 2010-03-25 | Discharge: 2010-03-25 | Payer: Self-pay | Source: Home / Self Care | Attending: Internal Medicine | Admitting: Internal Medicine

## 2010-03-25 ENCOUNTER — Other Ambulatory Visit: Payer: Self-pay | Admitting: Internal Medicine

## 2010-03-25 DIAGNOSIS — R42 Dizziness and giddiness: Secondary | ICD-10-CM | POA: Insufficient documentation

## 2010-03-26 LAB — CBC WITH DIFFERENTIAL/PLATELET
Basophils Absolute: 0 10*3/uL (ref 0.0–0.1)
Basophils Relative: 0.2 % (ref 0.0–3.0)
Eosinophils Absolute: 0.1 10*3/uL (ref 0.0–0.7)
Eosinophils Relative: 1.8 % (ref 0.0–5.0)
HCT: 37.6 % (ref 36.0–46.0)
Hemoglobin: 12.9 g/dL (ref 12.0–15.0)
Lymphocytes Relative: 33.3 % (ref 12.0–46.0)
Lymphs Abs: 2.2 10*3/uL (ref 0.7–4.0)
MCHC: 34.3 g/dL (ref 30.0–36.0)
MCV: 96.3 fl (ref 78.0–100.0)
Monocytes Absolute: 0 10*3/uL — ABNORMAL LOW (ref 0.1–1.0)
Monocytes Relative: 0.4 % — ABNORMAL LOW (ref 3.0–12.0)
Neutro Abs: 4.2 10*3/uL (ref 1.4–7.7)
Neutrophils Relative %: 64.3 % (ref 43.0–77.0)
Platelets: 254 10*3/uL (ref 150.0–400.0)
RBC: 3.91 Mil/uL (ref 3.87–5.11)
RDW: 12.6 % (ref 11.5–14.6)
WBC: 6.6 10*3/uL (ref 4.5–10.5)

## 2010-03-26 LAB — RENAL FUNCTION PANEL
Albumin: 3.9 g/dL (ref 3.5–5.2)
BUN: 11 mg/dL (ref 6–23)
CO2: 31 mEq/L (ref 19–32)
Calcium: 9.4 mg/dL (ref 8.4–10.5)
Chloride: 104 mEq/L (ref 96–112)
Creatinine, Ser: 0.7 mg/dL (ref 0.4–1.2)
GFR: 84.64 mL/min (ref >60.00)
Glucose, Bld: 76 mg/dL (ref 70–99)
Phosphorus: 3.7 mg/dL (ref 2.3–4.6)
Potassium: 4.4 mEq/L (ref 3.5–5.1)
Sodium: 142 mEq/L (ref 135–145)

## 2010-03-26 LAB — HEPATIC FUNCTION PANEL
ALT: 11 U/L (ref 0–35)
AST: 20 U/L (ref 0–37)
Albumin: 3.9 g/dL (ref 3.5–5.2)
Alkaline Phosphatase: 115 U/L (ref 39–117)
Bilirubin, Direct: 0 mg/dL (ref 0.0–0.3)
Total Bilirubin: 0.3 mg/dL (ref 0.3–1.2)
Total Protein: 6.6 g/dL (ref 6.0–8.3)

## 2010-03-26 LAB — TSH: TSH: 1.19 u[IU]/mL (ref 0.35–5.50)

## 2010-04-05 ENCOUNTER — Telehealth: Payer: Self-pay | Admitting: Internal Medicine

## 2010-04-13 NOTE — Progress Notes (Signed)
Summary: Rx Hydrocodone/APAP  Phone Note Refill Request Call back at [phone] Message from:  [facility] on April 24, 2009 9:46 AM  Refills Requested: Medication #1:  HYDROCODONE-ACETAMINOPHEN 5-500 MG TABS one pill every 12 hours as needed for pain..   Last Refilled: 03/26/2009 Received faxed refill request, please advise.  Forms in your IN box.   Method Requested: Telephone to Pharmacy Initial call taken by: Linde Gillis CMA [facility] Dull),  April 24, 2009 9:47 AM  Follow-up for Phone Call        too soon for this Got extra 4 days ago Can reconsider next week Follow-up by: Cindee Salt MD,  April 24, 2009 10:16 AM  Additional Follow-up for Phone Call Additional follow up Details #1::        left message on machine for patient to return call.  DeShannon Smith CMA [facility] Dull)  April 24, 2009 12:19 PM   patient called back and states that she did not get the rx filled from Dr. Kathrine Haddock because it was only for 15 tabs she didn't feel it was worth getting filled. Pt states that Dr. Alto Denver changed her rx to take 2  tabs every 4-6 hours. I told pt that she would have to follow-up with Dr. Alphonsus Sias because our rx has not changed. Pt wanted to know what she should do for her rx, should she call back next week for refill? DeShannon Smith CMA [facility] D[facility])  April 24, 2009 1:27 PM   She can call back next week and I will reevaluate. Let her know if she ever accepts a narcotic prescription from anyone else, i will never prescribe for her again!! Additional Follow-up by: Cindee Salt MD,  April 24, 2009 1:59 PM    Additional Follow-up for Phone Call Additional follow up Details #2::    Spoke with patient and advised results.  Follow-up by: Mervin Hack CMA [facility] Dull),  April 24, 2009 5:32 PM

## 2010-04-13 NOTE — Progress Notes (Signed)
Summary: neurontin / darvocet refill  Phone Note Refill Request Message from:  [facility] on November 26, 2007 4:00 PM  Refills Requested: Medication #1:  NEURONTIN 600 MG TABS 4 by mouth at bedtime  Medication #2:  DARVOCET A500 100-500 MG  TABS take 1 by mouth qid as needed pain   Last Ref[facility]: [id number]  ph # [phone] fax # [phone]  Initial call taken by: Cooper Render,  November 26, 2007 4:01 PM  Follow-up for Phone Call        darvocet #60 x 0  neurontin okay x 1 year (no fax here) Follow-up by: Cindee Salt MD,  November 26, 2007 5:37 PM  Additional Follow-up for Phone Call Additional follow up Details #1::        meds phoned to pharmacy. Additional Follow-up by: Cooper Render,  November 26, 2007 5:53 PM

## 2010-04-13 NOTE — Progress Notes (Signed)
Summary: Rx-Fioricet  Phone Note Refill Request Message from:  [phone] on Jul 17, 2008 9:28 AM  Refills Requested: Medication #1:  FIORICET 50-325-40 MG  TABS 2 at headache onset   Last Refilled: 05/26/2008 [facility] Request    Method Requested: Telephone to Pharmacy Initial call taken by: Mervin Hack CMA,  Jul 17, 2008 9:28 AM  Follow-up for Phone Call        okay #30 x 0 Follow-up by: Cindee Salt MD,  Jul 17, 2008 1:21 PM  Additional Follow-up for Phone Call Additional follow up Details #1::        Rx faxed to pharmacy Additional Follow-up by: Mervin Hack CMA,  Jul 17, 2008 2:03 PM      Prescriptions: FIORICET 50-325-40 MG  TABS [facility]) 2 at headache onset  #30 Tablet x 0   Entered by:   Mervin Hack CMA   Authorized by:   Cindee Salt MD   Signed by:   Mervin Hack CMA on 07/17/2008   Method used:   Electronically to        [facility] [phone]* (retail)       [address] [place], [place]  [address]       Ph: [phone] or [phone]       Fax: [phone]   RxID:   [id number]

## 2010-04-13 NOTE — Progress Notes (Signed)
SummaryEbony Ward  Phone Note Refill Request Message from:  [phone] on May 25, 2009 11:48 AM  Refills Requested: Medication #1:  XANAX 1 MG TABS Take 1 tablet by mouth four times a day   Last Refilled: 04/24/2009  Medication #2:  NORCO 5-325 MG TABS take 1 by mouth three times a day as needed   Last Refilled: 04/27/2009 Form on your desk    Method Requested: Fax to Local Pharmacy Initial call taken by: DeShannon Smith CMA Duncan Dull),  May 25, 2009 11:48 AM  Follow-up for Phone Call        okay 1 month of each with no refills Follow-up by: Cindee Salt MD,  May 25, 2009 1:24 PM  Additional Follow-up for Phone Call Additional follow up Details #1::        Rx faxed to pharmacy/ Eugenie Norrie Additional Follow-up by: Mervin Hack CMA [facility] Dull),  May 25, 2009 2:41 PM    Prescriptions: [name] 1 MG TABS (ALPRAZOLAM) Take 1 tablet by mouth four times a day  #120 x 0   Entered by:   Mervin Hack CMA (AAMA)   Authorized by:   Cindee Salt MD   Signed by:   Mervin Hack CMA (AAMA) on 05/25/2009   Method used:   Handwritten   RxID:   [id number] NORCO 5-325 MG TABS (HYDROCODONE-ACETAMINOPHEN) take 1 by mouth three times a day as needed  #90 x 0   Entered by:   Mervin Hack CMA (AAMA)   Authorized by:   Cindee Salt MD   Signed by:   Mervin Hack CMA (AAMA) on 05/25/2009   Method used:   Handwritten   RxID:   [id number]

## 2010-04-13 NOTE — Progress Notes (Signed)
Summary: refill request on Norflex  Phone Note Call from Patient   Caller: Patient Call For: Cindee Salt MD Summary of Call: Pt states she went to the [facility] in [place] last week and the doctor gave her Norflex and said if it helped to call Dr. Karle Starch office to have him call in more. Pt states Norflex is helping and would like more called in cause he only gave her 12. [facility] [address] [facility] [place] Initial call taken by: Silas Sacramento CMA,  Jul 15, 2008 10:12 AM  Follow-up for Phone Call        I am not going to just call in something that another doctor gave her. She needs to stick with me or transfer. I know she lives a long way away but I will only Rx something if we decide together at a visit that it is a good idea. SHe already takes more medicine than I would prefer. Follow-up by: Cindee Salt MD,  Jul 15, 2008 2:04 PM  Additional Follow-up for Phone Call Additional follow up Details #1::        left message on machine for patient to return call.  DeShannon Katrinka Blazing CMA  Jul 15, 2008 2:28 PM   patient called back and scheduled appt. Additional Follow-up by: Mervin Hack CMA,  Jul 15, 2008 3:29 PM

## 2010-04-13 NOTE — Progress Notes (Signed)
Summary: needs refill on norflex and needs darvocet changed  Phone Note Call from Patient Call back at Home Phone [phone]   Caller: Patient Summary of Call: Pt is asking for a refill on norflex and she needs to have her darvocet changed to something else.  She can take vicodin but she will have to take a benedryl along with it because it does make her itch some.  [facility] [address] [place]  [phone]. Initial call taken by: Lowella Petties CMA,  February 09, 2009 9:33 AM  Follow-up for Phone Call        okay norflex #60 x 1 Please correct on med list (so no asterick)  change to hydrocodone 5/325 three times a day as needed  #90 x 0 have her take with benedryl if this is necessary Follow-up by: Cindee Salt MD,  February 09, 2009 1:38 PM  Additional Follow-up for Phone Call Additional follow up Details #1::        Rx Called In, left message on machine that rx was called in Additional Follow-up by: Mervin Hack CMA (AAMA),  February 09, 2009 3:07 PM    New/Updated Medications: ORPHENADRINE CITRATE CR 100 MG XR12H-TAB (ORPHENADRINE CITRATE) take 1 by mouth two times a day NORCO 5-325 MG TABS (HYDROCODONE-ACETAMINOPHEN) take 1 by mouth three times a day as needed Prescriptions: NORCO 5-325 MG TABS (HYDROCODONE-ACETAMINOPHEN) take 1 by mouth three times a day as needed  #90 x 0   Entered by:   Mervin Hack CMA (AAMA)   Authorized by:   Cindee Salt MD   Signed by:   Mervin Hack CMA (AAMA) on 02/09/2009   Method used:   Telephoned to ...       [facility]  [place] [phone]* (retail)       [address] [place], [place]  [address]       Ph: [phone] or [phone]       Fax: [phone]   RxID:   [id number] ORPHENADRINE CITRATE CR 100 MG XR12H-TAB (ORPHENADRINE CITRATE) take 1 by mouth two times a day  #60 x 1   Entered by:   Mervin Hack CMA (AAMA)   Authorized by:   Cindee Salt MD   Signed by:   Mervin Hack CMA (AAMA) on  02/09/2009   Method used:   Telephoned to ...       [facility]  [place] [phone]* (retail)       [address] [place], [place]  [address]       Ph: [phone] or [phone]       Fax: [phone]   RxID:   [id number]

## 2010-04-13 NOTE — Letter (Signed)
Summary: [facility]  [facility] [address]   Imported By: Lanelle Bal 11/23/2009 11:14:38  _____________________________________________________________________  External Attachment:    Type:   Image     Comment:   External Document  Appended Document: [facility] left arm pain better with injection follow up as needed

## 2010-04-13 NOTE — Progress Notes (Signed)
Summary: Rx-Gen Darvocet  Phone Note Refill Request Call back at [phone] Message from:  Fax from Pharmacy on September 11, 2007 3:06 PM  Refills Requested: Medication #1:  DARVOCET A500 100-500 MG  TABS take 1 by mouth qid as needed pain   Last Refilled: 08/24/2007 #60. [facility] [id number]. [place]. [phone]  Initial call taken by: Silas Sacramento,  September 11, 2007 3:07 PM  Follow-up for Phone Call        PT JUST GOT 60 PILLS ON THE 12TH...SIGNIF JUMP IN USE ...WAIT FOR DR LETVAK'S APPROVAL.  On hold for Dr. Alphonsus Sias 09/13/07 Donavan Foil Follow-up by: Shaune Leeks MD,  September 11, 2007 3:25 PM  Additional Follow-up for Phone Call Additional follow up Details #1::        okay to approve #60 x 0 on Monday the 6th Additional Follow-up by: Cindee Salt MD,  September 13, 2007 8:51 PM    Additional Follow-up for Phone Call Additional follow up Details #2::    called in #60 with no refills Follow-up by: Silas Sacramento,  September 17, 2007 11:53 AM

## 2010-04-13 NOTE — Procedures (Signed)
Summary: Colonoscopy Report/[facility]  Colonoscopy Report/[facility]   Imported By: Maryln Gottron 11/19/2008 15:02:09  _____________________________________________________________________  External Attachment:    Type:   Image     Comment:   External Document  Appended Document: Colonoscopy Report/[facility]     Clinical Lists Changes  Observations: Added new observation of COLONOSCOPY: Results: Normal. Dr Madilyn Fireman (01/27/2003 12:47)       Colonoscopy  Procedure date:  01/27/2003  Findings:      Results: Normal. Dr Madilyn Fireman

## 2010-04-13 NOTE — Progress Notes (Signed)
  Phone Note [facility] Request [facility]  Refills Requested: Medication #1:  XANAX 1 MG TABS Take 1 tablet by mouth four times a day  Medication #2:  DARVOCET A500 100-500 MG  TABS take 1 by mouth qid as needed pain  Method Requested: Fax to Local Pharmacy Initial call taken by: Wandra Mannan,  March 12, 2007 11:34 AM      Prescriptions: DARVOCET A500 100-500 MG  TABS (PROPOXYPHENE N-APAP) take 1 by mouth qid as needed pain  #100 x 0   Entered and Authorized by:   Kerby Nora MD   Signed by:   Kerby Nora MD on 03/12/2007   Method used:   Handwritten   RxID:   [id number] XANAX 1 MG TABS (ALPRAZOLAM) Take 1 tablet by mouth four times a day  #120 x 0   Entered and Authorized by:   Kerby Nora MD   Signed by:   Kerby Nora MD on 03/12/2007   Method used:   Handwritten   RxID:   [id number]

## 2010-04-13 NOTE — Assessment & Plan Note (Signed)
Summary: 3 MONTH FOLLOW UP/[facility]H   Vital Signs:  Patient Profile:   60 Years Old Female Weight:      111.38 pounds Temp:     97.2 degrees F oral Pulse rate:   76 / minute Pulse rhythm:   regular Resp:     16 per minute BP sitting:   100 / 80  (left arm) Cuff size:   regular  Vitals Entered By: Silas Sacramento (August 30, 2007 3:12 PM)                 Chief Complaint:  3 month f/u and cold sxs and low grade fever and wants chol ck.  History of Present Illness: has been "down" since 2 days ago Fever, "got real sick" Got dizzy yesterday AM in bathroom--actually had to lay on floor to get back up to bed mild sinus drainage Achy but hard to tell if just her fibromyalgia slight chill but no measured temperature slight cough---thinks it is from PND No sig SOB but may be a little faster than usual. May be related to pain  Feels better with mood Still grieving for sister--this is better  chronic fibromyalgia pain is the same sleeps fair-gets 5 hours No sig daytime somnolence    Current Allergies: ! IBUPROFEN ! ASA ! VALIUM ! DARVON ! * CYMBALTA ! * RISPERDAL * SULFA (SULFONAMIDES) GROUP * SKELAXIN (METAXALONE) BUSPAR (BUSPIRONE HCL) CIPRO (CIPROFLOXACIN) EFFEXOR (VENLAFAXINE HCL) PERCOCET (OXYCODONE-ACETAMINOPHEN) LYRICA (PREGABALIN)  Past Medical History:    Reviewed history from 02/19/2007 and no changes required:       Chronic fatigue syndrome/fibromyalgia       Vitamin  B12 deficiency,       Obstipation-- put on high fiber diet       Depression       Hyperlipidemia       GERD/peptic ulcer       Migraine                Past Surgical History:    Reviewed history from 02/19/2007 and no changes required:       2005 Appendectomy       2004  Cholecystectomy       EGD- gastric ulcer       1986  tah- bso for endometriosis       1990's TMJ       1990's Bunion       2006 Ingrown toenails       06/2005 Hypotension, blood transfusion--bleeding ulcer  Pneumonia 3/07       Accidental overdose  2007   Social History:    Reviewed history from 02/19/2007 and no changes required:       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  Has one grown daughter in [place].         Smokes 1/2 ppd x 30 yrs.         Does not exercise.         Not in a relationship currently.         Owns a gun.       Current Smoker       Alcohol use-no       Drug use-no    Review of Systems       Bowels okay--uses miralax as needed  No abd pain in general--just upset a little with illness no heartburn problems Occ migraines--2 fioricet will generally abort    Physical Exam  General:  alert.  NAD Head:     no sinus tenderness Ears:     R ear normal and L ear normal.   Nose:     mild congestion Mouth:     no erythema and no exudates.   Neck:     supple and no cervical lymphadenopathy.   Lungs:     normal respiratory effort and normal breath sounds.   Heart:     normal rate, regular rhythm, no murmur, and no gallop.   Abdomen:     soft and non-tender.   Msk:     no joint tenderness and no joint swelling.   Pulses:     normal in feet Extremities:     no edema or calf tenderness Psych:     normally interactive, good eye contact, not anxious appearing, and not depressed appearing.      Impression & Recommendations:  Problem # 1:  URI (ICD-465.9) Assessment: Comment Only mild upper symptoms discussed viral etiology--will just use supportive care and she will call next week if worsening  Problem # 2:  GRIEF REACTION (ICD-309.0) Assessment: Improved back to baseline affective status  Problem # 3:  FIBROMYALGIA (ICD-729.1) Assessment: Unchanged chronic but stable uses darvocet with good relief in general considering water aerobics  Her updated medication list for this problem includes:    Fioricet 50-325-40 Mg Tabs (Butalbital-apap-caffeine) .Marland Kitchen... 2 at headache onset    Darvocet A500 100-500 Mg  Tabs (Propoxyphene n-apap) .Marland Kitchen... Take 1 by mouth qid as needed pain   Problem # 4:  ANXIETY (ICD-300.00) Assessment: Unchanged needs ongoing Rx  Her updated medication list for this problem includes:    Xanax 1 Mg Tabs (Alprazolam) .Marland Kitchen... Take 1 tablet by mouth four times a day   Problem # 5:  HYPERLIPIDEMIA (ICD-272.4) Assessment: Unchanged no meds but will check levels Orders: TLB-Lipid Panel (80061-LIPID) Venipuncture (16109)   Complete Medication List: 1)  Glycolax Powd (Polyethylene glycol 3350) .... As needed 2)  Neurontin 600 Mg Tabs (Gabapentin) .... 4 by mouth at bedtime 3)  Xanax 1 Mg Tabs (Alprazolam) .... Take 1 tablet by mouth four times a day 4)  Calcium 600 1500 Mg Tabs (Calcium carbonate) .Marland Kitchen.. 1 by mouth daily 5)  Omeprazole 20 Mg Tbec (Omeprazole) .... 2 daily 6)  Fioricet 50-325-40 Mg Tabs (Butalbital-apap-caffeine) .... 2 at headache onset 7)  Darvocet A500 100-500 Mg Tabs (Propoxyphene n-apap) .... Take 1 by mouth qid as needed pain 8)  Fish Oil  .... Daily 9)  Amoxicillin-pot Clavulanate 875-125 Mg Tabs (Amoxicillin-pot clavulanate) .Marland Kitchen.. 1 two times a day  Other Orders: TLB-CBC Platelet - w/Differential (85025-CBCD) TLB-TSH (Thyroid Stimulating Hormone) (84443-TSH) TLB-Renal Function Panel (80069-RENAL)   Patient Instructions: 1)  Please schedule a follow-up appointment in 3 months.   ]

## 2010-04-13 NOTE — Procedures (Signed)
Summary: [facility] / [facility] / DR. Jonny Ruiz HAYES  [facility] / [facility] / DR. Jonny Ruiz HAYES   Imported By: Carin Primrose 04/18/2008 10:52:00  _____________________________________________________________________  External Attachment:    Type:   Image     Comment:   External Document  Appended Document: [facility] / [facility] / DR. Dorena Cookey Schatzki's ring dilated no other pathology noted

## 2010-04-13 NOTE — Progress Notes (Signed)
Summary: RX PROPOXPHEN-APAP 100-650  Phone Note Refill Request Message from:  Fax from Pharmacy on October 30, 2007 2:06 PM  Refills Requested: Medication #1:  DARVOCET A500 100-500 MG  TABS take 1 by mouth qid as needed pain   Last Refilled: 10/02/2007 [facility] [place] [phone]  Initial call taken by: Providence Crosby,  October 30, 2007 2:07 PM  Follow-up for Phone Call        okay #60 x 0 Follow-up by: Cindee Salt MD,  October 30, 2007 2:22 PM  Additional Follow-up for Phone Call Additional follow up Details #1::        Rx called to pharmacy. Additional Follow-up by: Sydell Axon,  October 30, 2007 3:15 PM      Prescriptions: DARVOCET A500 100-500 MG  TABS (PROPOXYPHENE N-APAP) take 1 by mouth qid as needed pain  #60 x 0   Entered by:   Sydell Axon   Authorized by:   Cindee Salt MD   Signed by:   Sydell Axon on 10/30/2007   Method used:   Telephoned to ...         RxID:   [id number]

## 2010-04-13 NOTE — Assessment & Plan Note (Signed)
Summary: F/U ON PNEUMONIA/BIR   Vital Signs:  Patient profile:   60 year old female Weight:      113.6 pounds Temp:     97.6 degrees F oral Pulse rate:   66 / minute BP sitting:   100 / 60  (right arm) Cuff size:   regular  Vitals Entered By: Romualdo Bolk, CMA (August 26, 2008 11:27 AM) CC: Follow-up visit on pneumonia   History of Present Illness: Still has some cough No fever feels the pneumonia "is gone" Just has rattle in chest  Seen at [facility] urgent care for shoulder pain Tried norflex and it helped Has been using it as needed --about every other day seems to be helping her chronic pain  Mood has been okay chronic depression with no real worsening Chronic anxiety and panic--xanax helps  stomach is quiet occ heartburn---prilosec mostly controls    Current Medications (verified): 1)  Neurontin 600 Mg Tabs (Gabapentin) .... 4 By Mouth At Bedtime 2)  Xanax 1 Mg Tabs (Alprazolam) .... Take 1 Tablet By Mouth Four Times A Day 3)  Calcium 600 1500 Mg Tabs (Calcium Carbonate) .Marland Kitchen.. 1 By Mouth Daily 4)  Omeprazole 20 Mg  Tbec (Omeprazole) .... 2 Daily 5)  Fioricet 50-325-40 Mg  Tabs (Butalbital-Apap-Caffeine) .... 2 At Headache Onset 6)  Darvocet-N 100 100-650 Mg Tabs (Propoxyphene N-Apap) .... Take 1-2 By Mouth Once Daily As Needed 7)  Norflex 100 Mg .Marland Kitchen.. 1 Tab By Mouth Bid  Allergies (verified): 1)  ! Ibuprofen 2)  ! Asa 3)  ! Valium 4)  ! Darvon 5)  ! * Cymbalta 6)  ! * Risperdal 7)  ! Codeine 8)  * Sulfa (Sulfonamides) Group 9)  * Skelaxin (Metaxalone) 10)  Buspar (Buspirone Hcl) 11)  Cipro (Ciprofloxacin) 12)  Effexor 13)  Lyrica (Pregabalin) 14)  Tramadol Hcl (Tramadol Hcl) 15)  Percocet (Oxycodone-Acetaminophen)  Past History:  Past medical, surgical, family and social histories (including risk factors) reviewed for relevance to current acute and chronic problems.  Past Medical History: Reviewed history from 02/19/2007 and no changes  required. Chronic fatigue syndrome/fibromyalgia Vitamin  B12 deficiency, Obstipation-- put on high fiber diet Depression Hyperlipidemia GERD/peptic ulcer Migraine  Past Surgical History: Reviewed history from 02/19/2007 and no changes required. 2005 Appendectomy 2004  Cholecystectomy EGD- gastric ulcer 1986  tah- bso for endometriosis 1990's TMJ 1990's Bunion 2006 Ingrown toenails 06/2005 Hypotension, blood transfusion--bleeding ulcer Pneumonia 3/07 Accidental overdose  2007  Family History: Reviewed history from 05/28/2007 and no changes required. 5 sisters- HTN, DM, breast cancer (post-menop) 1 died of MI (had DM) father died of heart dz in 68s  mother died of AMI, stroke in 61s CAD-- Mom and Dad HBP:  1 or 2 sisters DM:  1 sister Breast CA:  1 sister Lung Cancer and Prostate CA:  in Aunts and Uncles  Social History: Reviewed history from 07/25/2008 and no changes required. 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  Has one grown daughter in [place].  Smokes 1/2 ppd x 30 yrs.  Does not exercise.   Not in a relationship currently.   Owns a gun. Current Smoker Alcohol use-no Drug use-no  Review of Systems       still doesn't sleep great---maybe 4-5 hours. Once she awakens, she can't get back to sleep Appetite is okay again did lose 4# with this illness   Physical Exam  General:  alert and normal appearance.   Neck:  supple, no masses, no thyromegaly, no carotid bruits, and no cervical lymphadenopathy.   Lungs:  normal respiratory effort, normal breath sounds, no crackles, and no wheezes.   Heart:  normal rate, regular rhythm, no murmur, and no gallop.   Abdomen:  soft and normal bowel sounds.   Mild RMQ tenderness Msk:  no joint tenderness and no joint swelling.   Extremities:  no edema Psych:  normally interactive, good eye contact, not anxious appearing, and not depressed appearing.     Impression &  Recommendations:  Problem # 1:  PNEUMONIA, RIGHT MIDDLE LOBE (ICD-486) Assessment Improved mild residual cough seems to be basically cleared though  Problem # 2:  FIBROMYALGIA (ICD-729.1) Assessment: Improved has done some better on the norflex--will continue  The following medications were removed from the medication list:    Orphenadrine Citrate Cr 100 Mg Xr12h-tab (Orphenadrine citrate) .Marland Kitchen... 1 by mouth two times a day Her updated medication list for this problem includes:    Fioricet 50-325-40 Mg Tabs (Butalbital-apap-caffeine) .Marland Kitchen... 2 at headache onset    Darvocet-n 100 100-650 Mg Tabs (Propoxyphene n-apap) .Marland Kitchen... Take 1-2 by mouth once daily as needed  Problem # 3:  GERD (ICD-530.81) Assessment: Unchanged fair control on the omeprazole  Her updated medication list for this problem includes:    Omeprazole 20 Mg Tbec (Omeprazole) .Marland Kitchen... 2 daily  Problem # 4:  ANXIETY (ICD-300.00) Assessment: Unchanged chronic with some degree of ongoing depression stable at this point  Her updated medication list for this problem includes:    Xanax 1 Mg Tabs (Alprazolam) .Marland Kitchen... Take 1 tablet by mouth four times a day  Complete Medication List: 1)  Neurontin 600 Mg Tabs (Gabapentin) .... 4 by mouth at bedtime 2)  Xanax 1 Mg Tabs (Alprazolam) .... Take 1 tablet by mouth four times a day 3)  Calcium 600 1500 Mg Tabs (Calcium carbonate) .Marland Kitchen.. 1 by mouth daily 4)  Omeprazole 20 Mg Tbec (Omeprazole) .... 2 daily 5)  Fioricet 50-325-40 Mg Tabs (Butalbital-apap-caffeine) .... 2 at headache onset 6)  Darvocet-n 100 100-650 Mg Tabs (Propoxyphene n-apap) .... Take 1-2 by mouth once daily as needed 7)  Norflex 100 Mg  .Marland Kitchen.. 1 tab by mouth two times a day  Patient Instructions: 1)  Keep August 30th appt for physical

## 2010-04-13 NOTE — Progress Notes (Signed)
Summary: wants referral for accupuncture  Phone Note Call from Patient Call back at Home Phone [phone]   Caller: Patient Call For: Cindee Salt MD Summary of Call: Pt states that you have recommeded she have accupuncture for her fibromyalgia.  She is asking to be referred for this so that maybe her insurance will pay for it.   She said her insurance is [place], [place] number [id number].  The phone number is [phone]. Initial call taken by: Lowella Petties,  July 08, 2008 2:47 PM  Follow-up for Phone Call        please see if you can find someone Dr Larna Daughters does it I think, otherwise it is chiropractors and other alternative providers Follow-up by: Cindee Salt MD,  July 08, 2008 3:11 PM  Additional Follow-up for Phone Call Additional follow up Details #1::        Gave the patient numbrs to call who do accupunture therapy. She will call to look into this. Additional Follow-up by: Carlton Adam,  July 09, 2008 4:55 PM

## 2010-04-13 NOTE — Progress Notes (Signed)
Summary: xanax refill request  Phone Note Refill Request Message from:  Fax from Pharmacy  Refills Requested: Medication #1:  XANAX 1 MG TABS Take 1 tablet by mouth four times a day   Last Refilled: 10/09/2007 faxed refill request from [facility] [address] [place]  [phone]  Initial call taken by: Lowella Petties,  November 07, 2007 2:58 PM  Follow-up for Phone Call        okay #120 x 0 Follow-up by: Cindee Salt MD,  November 08, 2007 1:13 PM  Additional Follow-up for Phone Call Additional follow up Details #1::        PRESCRIBTION FAXED TO PHARMACY Additional Follow-up by: Providence Crosby,  November 08, 2007 1:51 PM      Prescriptions: XANAX 1 MG TABS (ALPRAZOLAM) Take 1 tablet by mouth four times a day  #120 x 0   Entered by:   Providence Crosby   Authorized by:   Cindee Salt MD   Signed by:   Providence Crosby on 11/08/2007   Method used:   Print then Give to Patient   RxID:   [id number]

## 2010-04-13 NOTE — Progress Notes (Signed)
  [facility]  Refills Requested: Medication #1:  XANAX 1 MG TABS Take 1 tablet by mouth four times a day  Method Requested: Fax to Local Pharmacy Initial call taken by: Wandra Mannan,  April 12, 2007 9:28 AM  Follow-up for Phone Call        okay #120 x 0 Follow-up by: Cindee Salt MD,  April 12, 2007 1:38 PM  Additional Follow-up for Phone Call Additional follow up Details #1::        rx faxed Additional Follow-up by: Wandra Mannan,  April 12, 2007 2:02 PM      Prescriptions: [name] 1 MG TABS (ALPRAZOLAM) Take 1 tablet by mouth four times a day  #120 x 0   Entered by:   Wandra Mannan   Authorized by:   Cindee Salt MD   Signed by:   Wandra Mannan on 04/12/2007   Method used:   Handwritten   RxID:   [id number]

## 2010-04-13 NOTE — Assessment & Plan Note (Signed)
Summary: FOLLOW UP   Vital Signs:  Patient Profile:   60 Years Old Female Weight:      112.13 pounds Temp:     98.3 degrees F oral Pulse rate:   72 / minute BP sitting:   102 / 70  (left arm)  Vitals Entered By: Wandra Mannan (August 16, 2006 10:55 AM)               Chief Complaint:  fu.  History of Present Illness: Not doing well...."the same old thing" Did note recent hand swelling--seems better today. She had BBQ--I discussed the salt content.  Is on 20mg  fluoxetine--does occ feel jittery--not clearly from med Sporadically gets feeling of disorientation--may last 5 minutes. Often when 1st awakening Does use neurontin at night Usually takes xanax after fluoxetine, but her anxiety is fairly constant so hard to judge  Current Allergies: ! IBUPROFEN ! ASA ! VALIUM ! DARVON ! * CYMBALTA ! * RISPERDAL * SULFA (SULFONAMIDES) GROUP * SKELAXIN (METAXALONE) BUSPAR (BUSPIRONE HCL) CIPRO (CIPROFLOXACIN) EFFEXOR (VENLAFAXINE HCL) PERCOCET (OXYCODONE-ACETAMINOPHEN)      Physical Exam  General:     NAD Psych:     dysphoric affect and slightly anxious.      Impression & Recommendations:  Problem # 1:  FIBROMYALGIA (ICD-729.1) Assessment: Unchanged  The following medications were removed from the medication list:    Fioricet Tabs (Butalbital-apap-caffeine tabs) .Marland Kitchen... 2 at h/a onset  Her updated medication list for this problem includes:    Darvocet-n 100 100-650 Mg Tabs (Propoxyphene n-apap) ..... One-two by mouth daily as needed She has a complex mood disorder and somatic complaints. She has not been happy with a therapist and psychoactive meds have had limited efficacy P: Physiatry referral to try to work on pain issues Orders: Pain Clinic Referral (Pain)   Medications Added to Medication List This Visit: 1)  Neurontin 600 Mg Tabs (Gabapentin) .... One by mouth at bedtime   Patient Instructions: 1)  Please schedule a follow-up appointment in 2  month. 2)  Referral Appointment Information 3)  Day/Date: 4)  Time: 5)  Place/MD: 6)  Address: 7)  Phone/Fax: 8)  Patient given appointment information. Information/Orders faxed/mailed.   Prior Medications: COLACE 100 MG CAPS (DOCUSATE SODIUM) as needed GLYCOLAX  POWD (POLYETHYLENE GLYCOL 3350) as needed DARVOCET-N 100 100-650 MG TABS (PROPOXYPHENE N-APAP) one-two by mouth daily as needed NEXIUM 40 MG CPDR (ESOMEPRAZOLE MAGNESIUM) Take 1 tablet by mouth once a day ESTRADIOL 2 MG TABS (ESTRADIOL)  MAXALT 10 MG TABS (RIZATRIPTAN BENZOATE) Take 1 tablet by mouth once a day XANAX 1 MG TABS (ALPRAZOLAM) Take 1 tablet by mouth four times a day TRIAMCINOLONE ACETONIDE 0.1 % CREA (TRIAMCINOLONE ACETONIDE) Apply 1 a small amount to affected area three times a day FLUOXETINE HCL 20 MG CAPS (FLUOXETINE HCL) Take 1 capsule by mouth once a day CALCIUM 600 1500 MG TABS (CALCIUM CARBONATE) 1 by mouth daily BENADRYL 25 MG CAPS (DIPHENHYDRAMINE HCL) as needed PROMETHAZINE HCL 25 MG TABS (PROMETHAZINE HCL) as needed Current Allergies: ! IBUPROFEN ! ASA ! VALIUM ! DARVON ! * CYMBALTA ! * RISPERDAL * SULFA (SULFONAMIDES) GROUP * SKELAXIN (METAXALONE) BUSPAR (BUSPIRONE HCL) CIPRO (CIPROFLOXACIN) EFFEXOR (VENLAFAXINE HCL) PERCOCET (OXYCODONE-ACETAMINOPHEN) Current Medications (including changes made in today's visit):  COLACE 100 MG CAPS (DOCUSATE SODIUM) as needed GLYCOLAX  POWD (POLYETHYLENE GLYCOL 3350) as needed DARVOCET-N 100 100-650 MG TABS (PROPOXYPHENE N-APAP) one-two by mouth daily as needed NEURONTIN 600 MG TABS (GABAPENTIN) one by mouth at bedtime NEXIUM  40 MG CPDR (ESOMEPRAZOLE MAGNESIUM) Take 1 tablet by mouth once a day ESTRADIOL 2 MG TABS (ESTRADIOL)  MAXALT 10 MG TABS (RIZATRIPTAN BENZOATE) Take 1 tablet by mouth once a day XANAX 1 MG TABS (ALPRAZOLAM) Take 1 tablet by mouth four times a day TRIAMCINOLONE ACETONIDE 0.1 % CREA (TRIAMCINOLONE ACETONIDE) Apply 1 a small amount to  affected area three times a day FLUOXETINE HCL 20 MG CAPS (FLUOXETINE HCL) Take 1 capsule by mouth once a day CALCIUM 600 1500 MG TABS (CALCIUM CARBONATE) 1 by mouth daily BENADRYL 25 MG CAPS (DIPHENHYDRAMINE HCL) as needed PROMETHAZINE HCL 25 MG TABS (PROMETHAZINE HCL) as needed

## 2010-04-13 NOTE — Progress Notes (Signed)
SummaryPrudy Ward  Phone Note [facility]ill Request Message [phone] on April 24, 2009 9:44 AM  Refills Requested: Medication #1:  XANAX 1 MG TABS Take 1 tablet by mouth four times a day   Last Refilled: 03/26/2009 Form on your desk    Method Requested: Fax to Local Pharmacy Initial call taken by: Mervin Hack CMA Duncan Dull),  April 24, 2009 9:45 AM  Follow-up for Phone Call        okay #120 x 0 Follow-up by: Cindee Salt MD,  April 24, 2009 10:15 AM  Additional Follow-up for Phone Call Additional follow up Details #1::        Rx faxed to pharmacy Additional Follow-up by: DeShannon Smith CMA Duncan Dull),  April 24, 2009 12:17 PM    Prescriptions: Lindsey Ward 1 MG TABS (ALPRAZOLAM) Take 1 tablet by mouth four times a day  #120 x 0   Entered by:   Mervin Hack CMA (AAMA)   Authorized by:   Cindee Salt MD   Signed by:   Mervin Hack CMA (AAMA) on 04/24/2009   Method used:   Handwritten   RxID:   [id number]

## 2010-04-13 NOTE — Progress Notes (Signed)
  Phone Note From Pharmacy Call back at fax [phone]   Caller: Kirkland Hun   [id number] Call For: letvak  Summary of Call: refill alprazolam 1mg   take 0ne 4 time daily Initial call taken by: Wandra Mannan,  December 13, 2006 9:54 AM  Follow-up for Phone Call        [facility] okay Follow-up by: Cindee Salt MD,  December 13, 2006 10:35 AM  Additional Follow-up for Phone Call Additional follow up Details #1::        [facility] to [facility] Additional Follow-up by: Wandra Mannan,  December 13, 2006 10:45 AM

## 2010-04-13 NOTE — Progress Notes (Signed)
Summary: refills requests for xanax and darvocet  Phone Note Refill Request Message from:  Fax from Pharmacy  Refills Requested: Medication #1:  XANAX 1 MG TABS Take 1 tablet by mouth four times a day   Last Refilled: 11/28/2008  Medication #2:  DARVOCET-N 100 100-650 MG TABS take 1-2 by mouth once daily as needed   Last Refilled: 11/27/2008 [facility] [address] [place] [facility] on your desk.  Initial call taken by: Lowella Petties CMA,  December 25, 2008 12:26 PM  Follow-up for Phone Call        okay ! month of each x 0 refills Follow-up by: Cindee Salt MD,  December 25, 2008 12:38 PM  Additional Follow-up for Phone Call Additional follow up Details #1::        Rx faxed to pharmacy Additional Follow-up by: DeShannon Smith CMA (AAMA),  December 25, 2008 1:42 PM    Prescriptions: DARVOCET-N 100 100-650 MG TABS (PROPOXYPHENE N-APAP) take 1-2 by mouth once daily as needed  #60 x 0   Entered by:   Mervin Hack CMA (AAMA)   Authorized by:   Cindee Salt MD   Signed by:   Mervin Hack CMA (AAMA) on 12/25/2008   Method used:   Handwritten   RxID:   [id number] XANAX 1 MG TABS (ALPRAZOLAM) Take 1 tablet by mouth four times a day  #120 x 0   Entered by:   Mervin Hack CMA (AAMA)   Authorized by:   Cindee Salt MD   Signed by:   Mervin Hack CMA (AAMA) on 12/25/2008   Method used:   Handwritten   RxID:   [id number]

## 2010-04-13 NOTE — Progress Notes (Signed)
Summary: refill request for xanax/ ORPHENADRINE CITRATE CR  Phone Note Refill Request Message from:  Fax from Pharmacy  Refills Requested: Medication #1:  XANAX 1 MG TABS Take 1 tablet by mouth four times a day   Last Refilled: 06/25/2009  Medication #2:  ORPHENADRINE CITRATE CR 100 MG XR12H-TAB take 1 by mouth two times a day   Last Refilled: 02/06/2009 [facility] [address] [place] is on your desk. added Orphenadrine came as e-scribe. Lindsey Ward CMA Lindsey Ward)  Jul 24, 2009 1:26 PM   Initial call taken by: Lindsey Ward CMA,  Jul 24, 2009 12:51 PM  Follow-up for Phone Call        xanax #120 x 0  okay orphenadrine  #60 x 1 Follow-up by: Lindsey Salt MD,  Jul 24, 2009 1:40 PM  Additional Follow-up for Phone Call Additional follow up Details #1::        Rx faxed to pharmacy Additional Follow-up by: Lindsey Ward CMA Lindsey Ward),  Jul 24, 2009 2:22 PM    Prescriptions: ORPHENADRINE CITRATE CR 100 MG XR12H-TAB (ORPHENADRINE CITRATE) take 1 by mouth two times a day  #60 x 1   Entered by:   Lindsey Ward CMA (AAMA)   Authorized by:   Lindsey Salt MD   Signed by:   Lindsey Ward CMA (AAMA) on 07/24/2009   Method used:   [facility] to        [phone][facility] (retail)       [address] [place], [place]  [address]       Ph: [phone] or [phone]       Fax: [phone]   RxID:   [id number] Lindsey Ward 1 MG TABS (ALPRAZOLAM) Take 1 tablet by mouth four times a day  #120 x 0   Entered by:   Lindsey Ward CMA (AAMA)   Authorized by:   Lindsey Salt MD   Signed by:   Lindsey Ward CMA (AAMA) on 07/24/2009   Method used:   Handwritten   RxID:   [id number]

## 2010-04-13 NOTE — Progress Notes (Signed)
Summary: refill requests for xanax, norco  Phone Note Refill Request Message from:  Fax from Pharmacy  Refills Requested: Medication #1:  XANAX 1 MG TABS Take 1 tablet by mouth four times a day   Last Refilled: 07/24/2009  Medication #2:  NORCO 5-325 MG TABS take 1 by mouth three times a day as needed   Last Refilled: 05/25/2009 [facility] [place] are on your desk.  Initial call taken by: Lowella Petties CMA,  August 25, 2009 12:32 PM  Follow-up for Phone Call        okay 1 month of each Follow-up by: Cindee Salt MD,  August 25, 2009 1:41 PM  Additional Follow-up for Phone Call Additional follow up Details #1::        Rx faxed to pharmacy Additional Follow-up by: DeShannon Smith CMA Duncan Dull),  August 25, 2009 4:00 PM    Prescriptions: NORCO 5-325 MG TABS (HYDROCODONE-ACETAMINOPHEN) take 1 by mouth three times a day as needed  #90 x 0   Entered by:   Mervin Hack CMA (AAMA)   Authorized by:   Cindee Salt MD   Signed by:   Mervin Hack CMA (AAMA) on 08/25/2009   Method used:   Handwritten   RxID:   [id number] XANAX 1 MG TABS (ALPRAZOLAM) Take 1 tablet by mouth four times a day  #120 x 0   Entered by:   Mervin Hack CMA (AAMA)   Authorized by:   Cindee Salt MD   Signed by:   Mervin Hack CMA (AAMA) on 08/25/2009   Method used:   Handwritten   RxID:   [id number]

## 2010-04-13 NOTE — Progress Notes (Signed)
Summary: cough  Phone Note Call from Patient Call back at [facility] Phone [phone]   Caller: Patient Call For: dr copland Summary of Call: Pt was seen yesterday for follow up for pneumonia.  Now this morning she started coughing up green mucous and her voice is coming and going.  No fever.  [facility] [place]. Initial call taken by: Lowella Petties,  Aug 05, 2008 9:42 AM  Follow-up for Phone Call        lungs sounded good on exam, x-ray clear   not unusual to have coughing for two weeks or more after getting over pneumonia. This would be typical. Not unusual. Continue with fluids, cough medicines as needed Follow-up by: Hannah Beat MD,  Aug 05, 2008 9:58 AM  Additional Follow-up for Phone Call Additional follow up Details #1::        Advised pt. Additional Follow-up by: Lowella Petties,  Aug 05, 2008 10:06 AM

## 2010-04-13 NOTE — Progress Notes (Signed)
Summary: wants norvasc for headaches  Phone Note Call from Patient Call back at Home Phone [phone]   Caller: Patient Call For: Cindee Salt MD Summary of Call: Pt states she is getting frequent migraines and is having to take more butalbital than is prescribed- she takes 2 at onset, then one after an hour, but still has headaches.  She is asking if she can go back on norvasc, what Dr. Meryl Crutch had her on in the past. She says this controlled her headaches.   [facility] [place] Initial call taken by: Lowella Petties CMA,  December 04, 2009 11:19 AM  Follow-up for Phone Call        Okay to try amlodipine 2.5 mg daily #30 x 5 have her set up appt to review if it doesn't help or she has side effects Her BP runs low so I don't want to use any higher dose Follow-up by: Cindee Salt MD,  December 04, 2009 1:43 PM  Additional Follow-up for Phone Call Additional follow up Details #1::        Spoke with patient and advised results. Rx sent to pharmacy. Additional Follow-up by: Mervin Hack CMA Duncan Dull),  December 04, 2009 2:34 PM    New/Updated Medications: NORVASC 2.5 MG TABS (AMLODIPINE BESYLATE) take 1 by mouth once daily Prescriptions: NORVASC 2.5 MG TABS (AMLODIPINE BESYLATE) take 1 by mouth once daily  #30 x 5   Entered by:   Mervin Hack CMA (AAMA)   Authorized by:   Cindee Salt MD   Signed by:   Mervin Hack CMA (AAMA) on 12/04/2009   Method used:   [facility] to        [facility] [phone]* (retail)       [address] [place], [place]  [address]       Ph: [phone] or [phone]       Fax: [phone]   RxID:   [id number]

## 2010-04-13 NOTE — Assessment & Plan Note (Signed)
Summary: TROUBLE SWALLOWING,HIP PAIN/CLE   Vital Signs:  Patient Profile:   60 Years Old Female Weight:      108.50 pounds Temp:     97.8 degrees F oral Pulse rate:   72 / minute BP sitting:   88 / 52  (left arm)  Vitals Entered By: Wandra Mannan (September 05, 2006 12:27 PM)               Chief Complaint:  trouble swallowing, l hip pain, and discuss med.  History of Present Illness: Gets sensation after eating--early satiety today, feels full in lower chest and can't seem to get it to relax Did have stricture in past and needed a stretching Madilyn Fireman in [place]) Has been building up over the past week Still takes nexium but wonders if it safe to take every day  5-6 stools daily--normal for 1st 2 then very watery Lost about 4# since last OV  No cough or SOB  L hip pain also for about 1 week No known injury  Current Allergies: ! IBUPROFEN ! ASA ! VALIUM ! DARVON ! * CYMBALTA ! * RISPERDAL * SULFA (SULFONAMIDES) GROUP * SKELAXIN (METAXALONE) BUSPAR (BUSPIRONE HCL) CIPRO (CIPROFLOXACIN) EFFEXOR (VENLAFAXINE HCL) PERCOCET (OXYCODONE-ACETAMINOPHEN)      Physical Exam  General:     NAD Lungs:     normal respiratory effort and normal breath sounds.   Heart:     normal rate, regular rhythm, no murmur, and no gallop.   Abdomen:     soft, non-tender, no masses, no guarding, and no rigidity.   Psych:     slightly anxious.      Impression & Recommendations:  Problem # 1:  DYSPHAGIA (ICD-787.2) Assessment: Deteriorated she has a history of a stricture in the past and was dilated some years ago.  She wants to establish with [facility] and may need to have an EGD and dilatation again. I've asked her to continue on her Nexium and will set her up an appointment.  Problem # 2:  HIP PAIN, LEFT (ICD-719.45) seems arthritic will check x-rays  Her updated medication list for this problem includes:    Darvocet-n 100 100-650 Mg Tabs (Propoxyphene n-apap) ..... One-two  by mouth daily as needed  Orders: Diagnostic X-Ray/Fluoroscopy (Diagnostic X-Ray/Flu)   Other Orders: Gastroenterology Referral (GI)   Patient Instructions: 1)  Keep August 1st appointment 2)  Schedule X-rays 3)  Referral Appointment Information 4)  Day/Date: 5)  Time: 6)  Place/MD: 7)  Address: 8)  Phone/Fax: 9)  Patient given appointment information. Information/Orders faxed/mailed.      Current Allergies: ! IBUPROFEN ! ASA ! VALIUM ! DARVON ! * CYMBALTA ! * RISPERDAL * SULFA (SULFONAMIDES) GROUP * SKELAXIN (METAXALONE) BUSPAR (BUSPIRONE HCL) CIPRO (CIPROFLOXACIN) EFFEXOR (VENLAFAXINE HCL) PERCOCET (OXYCODONE-ACETAMINOPHEN) Current Medications (including changes made in today's visit):  COLACE 100 MG CAPS (DOCUSATE SODIUM) as needed GLYCOLAX  POWD (POLYETHYLENE GLYCOL 3350) as needed DARVOCET-N 100 100-650 MG TABS (PROPOXYPHENE N-APAP) one-two by mouth daily as needed NEURONTIN 600 MG TABS (GABAPENTIN) one by mouth at bedtime NEXIUM 40 MG CPDR (ESOMEPRAZOLE MAGNESIUM) Take 1 tablet by mouth once a day ESTRADIOL 2 MG TABS (ESTRADIOL)  MAXALT 10 MG TABS (RIZATRIPTAN BENZOATE) Take 1 tablet by mouth once a day XANAX 1 MG TABS (ALPRAZOLAM) Take 1 tablet by mouth four times a day TRIAMCINOLONE ACETONIDE 0.1 % CREA (TRIAMCINOLONE ACETONIDE) Apply 1 a small amount to affected area three times a day FLUOXETINE HCL 20 MG CAPS (FLUOXETINE HCL) Take  1 capsule by mouth once a day CALCIUM 600 1500 MG TABS (CALCIUM CARBONATE) 1 by mouth daily BENADRYL 25 MG CAPS (DIPHENHYDRAMINE HCL) as needed PROMETHAZINE HCL 25 MG TABS (PROMETHAZINE HCL) as needed

## 2010-04-13 NOTE — Assessment & Plan Note (Signed)
Summary: FOLLOW UP   Vital Signs:  Patient Profile:   60 Years Old Female Weight:      107.38 pounds Temp:     96.8 degrees F oral Pulse rate:   72 / minute BP sitting:   98 / 60  (left arm)  Vitals Entered By: Wandra Mannan (December 15, 2006 10:44 AM)                 Chief Complaint:  fu.  History of Present Illness: Chest pain seems to be resolved "I'm just drained"  Trying 'Womens ultra Mega vitamins"--can only tolerate 1  pain is about the same--using darvocet once and occ twice a day  wonders about retrying lyrica at a lower level She was put initally on 75mg  at a time  Insurance wouldn't cover accupuncture so she is done with Dr Larna Daughters   Current Allergies (reviewed today): ! IBUPROFEN ! ASA ! VALIUM ! DARVON ! * CYMBALTA ! * RISPERDAL * SULFA (SULFONAMIDES) GROUP * SKELAXIN (METAXALONE) BUSPAR (BUSPIRONE HCL) CIPRO (CIPROFLOXACIN) EFFEXOR (VENLAFAXINE HCL) PERCOCET (OXYCODONE-ACETAMINOPHEN) LYRICA (PREGABALIN)  Past Medical History:    Reviewed history from 10/13/2006 and no changes required:       chronic fatigue syndrome/fibromyalgia        hosp for bleeding ulcer 4-07        hx of B12 deficiency,       Obstipation-- put on high fiber diet       pneumonia 3-07       hospitalized for accidental drug overdose 2007       Depression       Hyperlipidemia       GERD/peptic ulcer       Migraine       constipation         Past Surgical History:    Reviewed history from 08/15/2006 and no changes required:       2005 Appendectomy       2004  Cholecystectomy       EGD- gastric ulcer       1986  tah- bso for endometriosis       1990's TMJ       1990's Bunion       2006 Ingrown toenails       2007 Hypotension, blood transfusion   Social History:    Reviewed history from 08/15/2006 and no changes required:       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  Has one grown daughter in [place].  Smokes 1/2 ppd x 30  yrs.  Does not exercise.  Not in a relationship currently.  Owns a gun.       Current Smoker       Alcohol use-no       Drug use-no    Review of Systems  The patient denies syncope and dyspnea on exhertion.         Sleep is variable but not bad   Physical Exam  General:     alert and normal appearance.   Psych:     normally interactive, good eye contact, and dysphoric affect.      Impression & Recommendations:  Problem # 1:  FIBROMYALGIA (ICD-729.1) Assessment: Unchanged will try lyrica again at a decreased dose  Her updated medication list for this problem includes:    Fioricet 50-325-40 Mg Tabs (Butalbital-apap-caffeine) .Marland Kitchen... 2 at headache onset    Darvocet A500 100-500 Mg Tabs (Propoxyphene n-apap) .Marland Kitchen... Prn  Complete Medication List: 1)  Glycolax Powd (Polyethylene glycol 3350) .... As needed 2)  Neurontin 600 Mg Tabs (Gabapentin) .... 3 by mouth at bedtime 3)  Estradiol 2 Mg Tabs (Estradiol) 4)  Xanax 1 Mg Tabs (Alprazolam) .... Take 1 tablet by mouth four times a day 5)  Calcium 600 1500 Mg Tabs (Calcium carbonate) .Marland Kitchen.. 1 by mouth daily 6)  Omeprazole 20 Mg Tbec (Omeprazole) .... 2 daily 7)  Fioricet 50-325-40 Mg Tabs (Butalbital-apap-caffeine) .... 2 at headache onset 8)  Darvocet A500 100-500 Mg Tabs (Propoxyphene n-apap) .... Prn 9)  Hyoscyamine Sulfate 0.125 Mg Tabs (Hyoscyamine sulfate) .Marland Kitchen.. 1 every 4 hours prn 10)  Womens [employer] Active  .... Take 1 tablet by mouth once a day 11)  Lyrica 25 Mg Caps (Pregabalin) .Marland Kitchen.. 1 daily for 1 week, then 1 two times a day for 1 week, then 1 three times a day   Patient Instructions: 1)  Please schedule a follow-up appointment in 2 months.    Prescriptions: LYRICA 25 MG  CAPS (PREGABALIN) 1 daily for 1 week, then 1 two times a day for 1 week, then 1 three times a day  #90 x 2   Entered and Authorized by:   Cindee Salt MD   Signed by:   Cindee Salt MD on 12/15/2006   Method used:   Print then Give  to Patient   RxID:   [id number]  ] Current Allergies (reviewed today): ! IBUPROFEN ! ASA ! VALIUM ! DARVON ! * CYMBALTA ! * RISPERDAL * SULFA (SULFONAMIDES) GROUP * SKELAXIN (METAXALONE) BUSPAR (BUSPIRONE HCL) CIPRO (CIPROFLOXACIN) EFFEXOR (VENLAFAXINE HCL) PERCOCET (OXYCODONE-ACETAMINOPHEN) LYRICA (PREGABALIN) Current Medications (including changes made in today's visit):  GLYCOLAX  POWD (POLYETHYLENE GLYCOL 3350) as needed NEURONTIN 600 MG TABS (GABAPENTIN) 3 by mouth at bedtime ESTRADIOL 2 MG TABS (ESTRADIOL)  XANAX 1 MG TABS (ALPRAZOLAM) Take 1 tablet by mouth four times a day CALCIUM 600 1500 MG TABS (CALCIUM CARBONATE) 1 by mouth daily OMEPRAZOLE 20 MG  TBEC (OMEPRAZOLE) 2 daily FIORICET 50-325-40 MG  TABS (BUTALBITAL-APAP-CAFFEINE) 2 at headache onset DARVOCET A500 100-500 MG  TABS (PROPOXYPHENE N-APAP) prn HYOSCYAMINE SULFATE 0.125 MG  TABS (HYOSCYAMINE SULFATE) 1 every 4 hours prn * WOMENS ULTRE MEG ACTIVE Take 1 tablet by mouth once a day LYRICA 25 MG  CAPS (PREGABALIN) 1 daily for 1 week, then 1 two times a day for 1 week, then 1 three times a day

## 2010-04-13 NOTE — Progress Notes (Signed)
Summary: refill request for norco, xanax  Phone Note Refill Request Message from:  Fax from Pharmacy  Refills Requested: Medication #1:  XANAX 1 MG TABS Take 1 tablet by mouth four times a day   Last Refilled: 11/26/2009  Medication #2:  NORCO 5-325 MG TABS take 1 by mouth three times a day as needed [facility] [address] [place] is on your desk.  Initial call taken by: Lowella Petties CMA,  December 23, 2009 2:40 PM  Follow-up for Phone Call        Okay alprazolam #120 x 0 Norco #90 x 0 Follow-up by: Cindee Salt MD,  December 24, 2009 12:49 PM  Additional Follow-up for Phone Call Additional follow up Details #1::        Rx faxed to pharmacy Additional Follow-up by: DeShannon Smith CMA Duncan Dull),  December 24, 2009 1:23 PM    Prescriptions: NORCO 5-325 MG TABS (HYDROCODONE-ACETAMINOPHEN) take 1 by mouth three times a day as needed  #90 x 0   Entered by:   Mervin Hack CMA (AAMA)   Authorized by:   Cindee Salt MD   Signed by:   Mervin Hack CMA (AAMA) on 12/24/2009   Method used:   Handwritten   RxID:   [id number] XANAX 1 MG TABS (ALPRAZOLAM) Take 1 tablet by mouth four times a day  #120 x 0   Entered by:   Mervin Hack CMA (AAMA)   Authorized by:   Cindee Salt MD   Signed by:   Mervin Hack CMA (AAMA) on 12/24/2009   Method used:   Handwritten   RxID:   [id number]

## 2010-04-13 NOTE — Assessment & Plan Note (Signed)
Summary: sob, weakness/tsc   Vital Signs:  Patient Profile:   60 Years Old Female Weight:      106.50 pounds Temp:     97.1 degrees F oral Pulse rate:   72 / minute BP sitting:   112 / 70  (left arm)  Vitals Entered By: Wandra Mannan (November 24, 2006 2:26 PM)                 Chief Complaint:  SOB  weakness.  History of Present Illness: Still feels weak and has had some shortness of breath Feels something in chest and then throat gets tight Head hurts off and on Heart flutters Gets dizzy No water brash and does take omeprazole Notes the SOB if she gets excited--she feels more relaxed now and is calm and she doesn't feel it Really describes a tightness  Current Allergies: ! IBUPROFEN ! ASA ! VALIUM ! DARVON ! * CYMBALTA ! * RISPERDAL * SULFA (SULFONAMIDES) GROUP * SKELAXIN (METAXALONE) BUSPAR (BUSPIRONE HCL) CIPRO (CIPROFLOXACIN) EFFEXOR (VENLAFAXINE HCL) PERCOCET (OXYCODONE-ACETAMINOPHEN) LYRICA (PREGABALIN)  Past Medical History:    Reviewed history from 10/13/2006 and no changes required:       chronic fatigue syndrome/fibromyalgia        hosp for bleeding ulcer 4-07        hx of B12 deficiency,       Obstipation-- put on high fiber diet       pneumonia 3-07       hospitalized for accidental drug overdose 2007       Depression       Hyperlipidemia       GERD/peptic ulcer       Migraine       constipation              CON[facility]ULTANT[facility]       Dr Larna Daughters  Past Surgical History:    Reviewed history from 08/15/2006 and no changes required:       2005 Appendectomy       2004  Cholecystectomy       EGD- gastric ulcer       1986  tah- bso for endometriosis       1990's TMJ       1990's Bunion       2006 Ingrown toenails       2007 Hypotension, blood transfusion     Review of Systems      See HPI       occ cough No fever   Physical Exam  General:     NAD, calm Neck:     supple, no masses, no thyromegaly, no carotid bruits, and no  cervical lymphadenopathy.   Lungs:     normal respiratory effort and normal breath sounds.   Heart:     normal rate, regular rhythm, no murmur, and no gallop.   Abdomen:     soft.  mild generalized tenderness Extremities:     no edema Psych:     dysphoric affect and slightly anxious.   Additional Exam:     EKG--sinus brady, benign    Impression & Recommendations:  Problem # 1:  CHEST PAIN (ICD-786.50) Assessment: New I do feel that her pain is somewhat atypical for coronary disease but certainly this is a possibility.  She does have dizziness and is concerned about her blood pressure being low.  It does seem orthostatic so she may be right.  She is not on any antihypertensives for maybe having daytime  symptoms from high doses of her gabapentin or even the tramadol. Wife and coronary disease is unlikely I think we need to do a stress test to exclude that since she does have chest tightness and shortness of breath.  Plan: I've asked her to cut her gabapentin down to 3 at night and to double her omeprazole to twice a day.  Will schedule stress test and she will keep her scheduled follow-up. Orders: Cardiology Referral (Cardiology)   Complete Medication List: 1)  Glycolax Powd (Polyethylene glycol 3350) .... As needed 2)  Neurontin 600 Mg Tabs (Gabapentin) .... 3 by mouth at bedtime 3)  Estradiol 2 Mg Tabs (Estradiol) 4)  Xanax 1 Mg Tabs (Alprazolam) .... Take 1 tablet by mouth four times a day 5)  Fluoxetine Hcl 20 Mg Caps (Fluoxetine hcl) .... Take 1 capsule by mouth once a day 6)  Calcium 600 1500 Mg Tabs (Calcium carbonate) .Marland Kitchen.. 1 by mouth daily 7)  Omeprazole 20 Mg Tbec (Omeprazole) .... 2 daily 8)  Fioricet 50-325-40 Mg Tabs (Butalbital-apap-caffeine) .... 2 at headache onset 9)  Darvocet A500 100-500 Mg Tabs (Propoxyphene n-apap) .... Prn  Other Orders: CXR- 2view (CXR)   Patient Instructions: 1)  Please schedule a follow-up appointment in 2-3 weeks. 2)  Schedule  stress test 3)  Decrease gabapentin to 3 tabs at bedtime 4)  Please take omeprazole before breakfast AND before supper    ]

## 2010-04-13 NOTE — Progress Notes (Signed)
SummaryPrudy Ward  Phone Note [facility]ill Request [phone] on June 25, 2009 9:55 AM  Refills Requested: Medication #1:  XANAX 1 MG TABS Take 1 tablet by mouth four times a day   Last Refilled: 05/25/2009 Form on your desk    Method Requested: Fax to Local Pharmacy Initial call taken by: Mervin Hack CMA Duncan Dull),  June 25, 2009 9:55 AM  Follow-up for Phone Call        okay #120 x 0 Follow-up by: Cindee Salt MD,  June 25, 2009 1:14 PM  Additional Follow-up for Phone Call Additional follow up Details #1::        Rx faxed to pharmacy Additional Follow-up by: DeShannon Smith CMA Duncan Dull),  June 25, 2009 2:12 PM    Prescriptions: Lindsey Ward 1 MG TABS (ALPRAZOLAM) Take 1 tablet by mouth four times a day  #120 x 0   Entered by:   Mervin Hack CMA (AAMA)   Authorized by:   Cindee Salt MD   Signed by:   Mervin Hack CMA (AAMA) on 06/25/2009   Method used:   Handwritten   RxID:   [id number]

## 2010-04-13 NOTE — Progress Notes (Signed)
Summary: refill request for xanax and darvocet  Phone Note Refill Request Message from:  Fax from Pharmacy  Refills Requested: Medication #1:  DARVOCET-N 100 100-650 MG TABS take 1-2 by mouth once daily as needed   Last Refilled: 08/27/2008  Medication #2:  [name] 1 MG TABS Take 1 tablet by mouth four times a day   Last Refilled: 08/27/2008 [facility]ed [facility] [address] [place] are on your desk.  Initial call taken by: Lowella Petties CMA,  September 24, 2008 11:38 AM  Follow-up for Phone Call        Refill approved-nurse to complete 1 month of each with no refills Follow-up by: Cindee Salt MD,  September 24, 2008 1:56 PM  Additional Follow-up for Phone Call Additional follow up Details #1::        Rx faxed to pharmacy Additional Follow-up by: Mervin Hack CMA,  September 24, 2008 2:11 PM    Prescriptions: DARVOCET-N 100 100-650 MG TABS (PROPOXYPHENE N-APAP) take 1-2 by mouth once daily as needed  #60 x 0   Entered by:   Mervin Hack CMA   Authorized by:   Cindee Salt MD   Signed by:   Mervin Hack CMA on 09/24/2008   Method used:   Handwritten   RxID:   [id number] XANAX 1 MG TABS (ALPRAZOLAM) Take 1 tablet by mouth four times a day  #120 x 0   Entered by:   Mervin Hack CMA   Authorized by:   Cindee Salt MD   Signed by:   Mervin Hack CMA on 09/24/2008   Method used:   Handwritten   RxID:   [id number]

## 2010-04-13 NOTE — Assessment & Plan Note (Signed)
Summary: 4 MONTH FOLLOW UP/[facility]   Vital Signs:  Patient profile:   60 year old female Weight:      116 pounds Temp:     97.4 degrees F oral Pulse rate:   72 / minute Pulse rhythm:   regular BP sitting:   98 / 60  (left arm) Cuff size:   regular  Vitals Entered By: Mervin Hack CMA Duncan Dull) (March 10, 2009 12:19 PM) CC: 4 month follow-up, Headache   History of Present Illness: Discussed bone density has osteopenia on vitanin d and calcium does some weight bearing exercise  migraines have been more freq comes on in back of left eye Fioricet relieves 2-3 times per week  Off darvocet --off the market Using the hydrocodone variably--but keeping it at three times a day or less needs benedryl with it to keep her from itching  Some hot and cold flashes now had hot flashes in past off the estrogen for 2 years Has tried black cohosh without any effect  Occ dizzy spells  has to stop and hold onto something Has true vertigo No syncope tries to be careful to avoid rapid position and head changes  Stomach okay ongoing mild symptoms takes the omeprazole occ lump in chest---relates to anxiety swallows okay  depression is variable Up and down some   Allergies: 1)  ! Ibuprofen 2)  ! Asa 3)  ! Valium 4)  ! Darvon 5)  ! * Cymbalta 6)  ! * Risperdal 7)  ! Codeine 8)  * Sulfa (Sulfonamides) Group 9)  * Skelaxin (Metaxalone) 10)  Buspar (Buspirone Hcl) 11)  Cipro (Ciprofloxacin) 12)  Effexor 13)  Lyrica (Pregabalin) 14)  Tramadol Hcl (Tramadol Hcl) 15)  Percocet (Oxycodone-Acetaminophen)  Past History:  Past medical, surgical, family and social histories (including risk factors) reviewed for relevance to current acute and chronic problems.  Past Medical History: Chronic fatigue syndrome/fibromyalgia Vitamin  B12 deficiency, Obstipation-- put on high fiber diet Depression Hyperlipidemia GERD/peptic ulcer Migraine Osteopenia  Past Surgical  History: Reviewed history from 11/10/2008 and no changes required. 2005 Appendectomy 2004  Cholecystectomy EGD- Gastric ulcer 1986  TAH-BSO for endometriosis 1990's TMJ 1990's Bunion 2006 Ingrown toenails 06/2005 Hypotension, blood transfusion--bleeding ulcer Pneumonia 3/07 Accidental overdose  2007  Family History: Reviewed history from 05/28/2007 and no changes required. 5 sisters- HTN, DM, breast cancer (post-menop) 1 died of MI (had DM) father died of heart dz in 71s  mother died of AMI, stroke in 25s CAD-- Mom and Dad HBP:  1 or 2 sisters DM:  1 sister Breast CA:  1 sister Lung Cancer and Prostate CA:  in Aunts and Uncles  Social History: Reviewed history from 07/25/2008 and no changes required. 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  Has one grown daughter in [place].  Smokes 1/2 ppd x 30 yrs.  Does not exercise.   Not in a relationship currently.   Owns a gun. Current Smoker Alcohol use-no Drug use-no  Review of Systems       appetite is okay weight up 2# chronic sleep problems--- 4.5-5 hours per night Occ daytime tired feeling but only occ has to rest occ right hip pain  Physical Exam  General:  alert and normal appearance.   Head:  normocephalic and atraumatic.   Neck:  supple, no masses, no thyromegaly, no carotid bruits, and no cervical lymphadenopathy.   Lungs:  normal respiratory effort and normal breath sounds.   Heart:  normal rate,  regular rhythm, no murmur, and no gallop.   Abdomen:  soft and non-tender.   Msk:  good ROM of hips  no synovitis Extremities:  no edema Psych:  normally interactive, good eye contact, not anxious appearing, and not depressed appearing.     Impression & Recommendations:  Problem # 1:  FIBROMYALGIA (ICD-729.1) Assessment Unchanged ongoing pain discussed regular exercise but not overdoing it  Her updated medication list for this problem includes:    Fioricet 50-325-40 Mg Tabs  (Butalbital-apap-caffeine) .Marland Kitchen... 2 at headache onset    Orphenadrine Citrate Cr 100 Mg Xr12h-tab (Orphenadrine citrate) .Marland Kitchen... Take 1 by mouth two times a day    Norco 5-325 Mg Tabs (Hydrocodone-acetaminophen) .Marland Kitchen... Take 1 by mouth three times a day as needed  Problem # 2:  ANXIETY (ICD-300.00) Assessment: Unchanged ongoing mood problems but stable no changes needed  Her updated medication list for this problem includes:    Xanax 1 Mg Tabs (Alprazolam) .Marland Kitchen... Take 1 tablet by mouth four times a day  Problem # 3:  MIGRAINE, UNSPEC., W/O INTRACTABLE MIGRAINE (ICD-346.90) Assessment: Unchanged some increased freq not clear why regimen is okay for now  Her updated medication list for this problem includes:    Fioricet 50-325-40 Mg Tabs (Butalbital-apap-caffeine) .Marland Kitchen... 2 at headache onset    Norco 5-325 Mg Tabs (Hydrocodone-acetaminophen) .Marland Kitchen... Take 1 by mouth three times a day as needed  Problem # 4:  GERD (ICD-530.81) Assessment: Unchanged okay on the med  Her updated medication list for this problem includes:    Omeprazole 20 Mg Tbec (Omeprazole) .Marland Kitchen... 2 daily  Problem # 5:  OSTEOPENIA (ICD-733.90) Assessment: New discussed Rx repeat DEXA in 3-5 years  Her updated medication list for this problem includes:    Calcium 600 1500 Mg Tabs (Calcium carbonate) .Marland Kitchen... 1 by mouth daily  Complete Medication List: 1)  Neurontin 600 Mg Tabs (Gabapentin) .... 4 by mouth at bedtime 2)  Xanax 1 Mg Tabs (Alprazolam) .... Take 1 tablet by mouth four times a day 3)  Calcium 600 1500 Mg Tabs (Calcium carbonate) .Marland Kitchen.. 1 by mouth daily 4)  Omeprazole 20 Mg Tbec (Omeprazole) .... 2 daily 5)  Fioricet 50-325-40 Mg Tabs (Butalbital-apap-caffeine) .... 2 at headache onset 6)  Orphenadrine Citrate Cr 100 Mg Xr12h-tab (Orphenadrine citrate) .... Take 1 by mouth two times a day 7)  Norco 5-325 Mg Tabs (Hydrocodone-acetaminophen) .... Take 1 by mouth three times a day as needed  Patient Instructions: 1)   Please try loratadine 10mg  1-2 daily or cetirizine 10mg  daily to prevent itching (instead of benedryl) 2)  Please schedule a follow-up appointment in 4 months .   Current Allergies (reviewed today): ! IBUPROFEN ! ASA ! VALIUM ! DARVON ! * CYMBALTA ! * RISPERDAL ! CODEINE * SULFA (SULFONAMIDES) GROUP * SKELAXIN (METAXALONE) BUSPAR (BUSPIRONE HCL) CIPRO (CIPROFLOXACIN) EFFEXOR LYRICA (PREGABALIN) TRAMADOL HCL (TRAMADOL HCL) PERCOCET (OXYCODONE-ACETAMINOPHEN)

## 2010-04-13 NOTE — Assessment & Plan Note (Signed)
Summary: FOLLOW UP   Vital Signs:  Patient Profile:   60 Years Old Female Weight:      109.25 pounds Temp:     96.6 degrees F oral Pulse rate:   76 / minute BP sitting:   88 / 56  (left arm)  Vitals Entered By: Wandra Mannan (May 28, 2007 3:17 PM)                 Chief Complaint:  follow up.  History of Present Illness: Not doing so well Something new has added on to her chronic problems Just lost a sister---supposedly a massive heart attack Still adjusting --some questions about what was going on  Some trouble with her pain Felt light headed this AM, just trying to get ready Tries to stay busy Has trouble comprehending things--not sure she is focusing on things that well (like what gets said in the office here) This is not really a new thing Loses things, like her keys  Feels depressed on an everyday basis Still has regular anxiety and panic attacks considering trying a new psychiatrist  Is Baptsit, not active in church does feel she is spiritual Discussed the possibility of exploring her faith    Current Allergies (reviewed today): ! IBUPROFEN ! ASA ! VALIUM ! DARVON ! * CYMBALTA ! * RISPERDAL * SULFA (SULFONAMIDES) GROUP * SKELAXIN (METAXALONE) BUSPAR (BUSPIRONE HCL) CIPRO (CIPROFLOXACIN) EFFEXOR (VENLAFAXINE HCL) PERCOCET (OXYCODONE-ACETAMINOPHEN) LYRICA (PREGABALIN)  Past Medical History:    Reviewed history from 02/19/2007 and no changes required:       Chronic fatigue syndrome/fibromyalgia       Vitamin  B12 deficiency,       Obstipation-- put on high fiber diet       Depression       Hyperlipidemia       GERD/peptic ulcer       Migraine                Past Surgical History:    Reviewed history from 02/19/2007 and no changes required:       2005 Appendectomy       2004  Cholecystectomy       EGD- gastric ulcer       1986  tah- bso for endometriosis       1990's TMJ       1990's Bunion       2006 Ingrown toenails       06/2005  Hypotension, blood transfusion--bleeding ulcer       Pneumonia 3/07       Accidental overdose  2007   Family History:    5 sisters- HTN, DM, breast cancer (post-menop)    1 died of MI (had DM)    father died of heart dz in 62s     mother died of AMI, stroke in 1s    CAD-- Mom and Dad    HBP:  1 or 2 sisters    DM:  1 sister    Breast CA:  1 sister    Lung Cancer and Prostate CA:  in Aunts and Uncles  Social History:    Reviewed history from 02/19/2007 and no changes required:       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  Has one grown daughter in [place].         Smokes 1/2 ppd x 30 yrs.         Does not exercise.  Not in a relationship currently.         Owns a gun.       Current Smoker       Alcohol use-no       Drug use-no    Review of Systems  The patient denies chest pain, syncope, and dyspnea on exhertion.         sleep is variable appetite not great but has gained 1#   Physical Exam  General:     alert.  NAD Neck:     supple, no masses, no thyromegaly, no carotid bruits, and no cervical lymphadenopathy.   Lungs:     normal respiratory effort and normal breath sounds.   Heart:     normal rate, regular rhythm, no murmur, and no gallop.   Msk:     no joint tenderness and no joint swelling.   Extremities:     no edema or calf tenderness Psych:     normally interactive, good eye contact, dysphoric affect, flat affect, and subdued.      Impression & Recommendations:  Problem # 1:  GRIEF REACTION (ICD-309.0) Assessment: New causing worsening of concentration issues Discussed the possibility of exploring her faith  Problem # 2:  DEPRESSION (ICD-311) Assessment: Unchanged chronic considering new psychiatirst  Her updated medication list for this problem includes:    Xanax 1 Mg Tabs (Alprazolam) .Marland Kitchen... Take 1 tablet by mouth four times a day   Problem # 3:  GERD (ICD-530.81) Assessment: Unchanged okay  with meds  Her updated medication list for this problem includes:    Omeprazole 20 Mg Tbec (Omeprazole) .Marland Kitchen... 2 daily   Problem # 4:  SYMPTOM, DISTURBANCE, SLEEP NOS (ICD-780.50) Assessment: Unchanged ongoing problems--esp with the grieving  Problem # 5:  FIBROMYALGIA (ICD-729.1) Assessment: Unchanged chronic without sig change  Her updated medication list for this problem includes:    Fioricet 50-325-40 Mg Tabs (Butalbital-apap-caffeine) .Marland Kitchen... 2 at headache onset    Darvocet A500 100-500 Mg Tabs (Propoxyphene n-apap) .Marland Kitchen... Take 1 by mouth qid as needed pain   Complete Medication List: 1)  Glycolax Powd (Polyethylene glycol 3350) .... As needed 2)  Neurontin 600 Mg Tabs (Gabapentin) .... 4 by mouth at bedtime 3)  Estradiol 2 Mg Tabs (Estradiol) .... 1/2 tab daily 4)  Xanax 1 Mg Tabs (Alprazolam) .... Take 1 tablet by mouth four times a day 5)  Calcium 600 1500 Mg Tabs (Calcium carbonate) .Marland Kitchen.. 1 by mouth daily 6)  Omeprazole 20 Mg Tbec (Omeprazole) .... 2 daily 7)  Fioricet 50-325-40 Mg Tabs (Butalbital-apap-caffeine) .... 2 at headache onset 8)  Darvocet A500 100-500 Mg Tabs (Propoxyphene n-apap) .... Take 1 by mouth qid as needed pain 9)  Fish Oil  .... Daily   Patient Instructions: 1)  Please schedule a follow-up appointment in 3 months.    ] Current Allergies (reviewed today): ! IBUPROFEN ! ASA ! VALIUM ! DARVON ! * CYMBALTA ! * RISPERDAL * SULFA (SULFONAMIDES) GROUP * SKELAXIN (METAXALONE) BUSPAR (BUSPIRONE HCL) CIPRO (CIPROFLOXACIN) EFFEXOR (VENLAFAXINE HCL) PERCOCET (OXYCODONE-ACETAMINOPHEN) LYRICA (PREGABALIN) Current Medications (including changes made in today's visit):  GLYCOLAX  POWD (POLYETHYLENE GLYCOL 3350) as needed NEURONTIN 600 MG TABS (GABAPENTIN) 4 by mouth at bedtime ESTRADIOL 2 MG TABS (ESTRADIOL) 1/2 tab daily XANAX 1 MG TABS (ALPRAZOLAM) Take 1 tablet by mouth four times a day CALCIUM 600 1500 MG TABS (CALCIUM CARBONATE) 1 by mouth  daily OMEPRAZOLE 20 MG  TBEC (OMEPRAZOLE) 2 daily FIORICET 50-325-40 MG  TABS (BUTALBITAL-APAP-CAFFEINE)  2 at headache onset DARVOCET A500 100-500 MG  TABS (PROPOXYPHENE N-APAP) take 1 by mouth qid as needed pain * FISH OIL daily

## 2010-04-13 NOTE — Progress Notes (Signed)
SummaryPrudy Feeler REFILL   Phone Note Refill Request Message from:  Fax from Pharmacy on December 05, 2007 1:22 PM  Refills Requested: Medication #1:  XANAX 1 MG TABS Take 1 tablet by mouth four times a day   Last Ref[facility]: [facility][phone]  Initial call taken by: Doristine Devoid,  December 05, 2007 1:23 PM  Follow-up for Phone Call        okay #120 x 0 Follow-up by: Cindee Salt MD,  December 05, 2007 2:10 PM  Additional Follow-up for Phone Call Additional follow up Details #1::        called to pharmacy Additional Follow-up by: Lowella Petties,  December 05, 2007 2:37 PM

## 2010-04-13 NOTE — Progress Notes (Signed)
  [facility] Note [facility]  Refills Requested: Medication #1:  XANAX 1 MG TABS Take 1 tablet by mouth four times a day  Method Requested: Fax to Local Pharmacy Initial call taken by: Wandra Mannan,  January 12, 2007 4:43 PM  Follow-up for Phone Call        rx faxed to pharmacy Follow-up by: Wandra Mannan,  January 12, 2007 4:52 PM

## 2010-04-13 NOTE — Assessment & Plan Note (Signed)
Summary: 2:15 COUGH FEVER BODY ACHES/MK   Vital Signs:  Patient Profile:   60 Years Old Female Weight:      111.25 pounds Temp:     98.4 degrees F oral Pulse rate:   72 / minute BP sitting:   106 / 60  (left arm)  Vitals Entered By: Wandra Mannan (Jul 13, 2007 2:03 PM)                 Chief Complaint:  cough, fever, and body aches.  History of Present Illness: Started yesterday Head felt foggy and then started with headache Chills came on and some fever Now achy all over, from the neck down Cough with some green mucus started yesterday some chills Some SOB over the last 2 days--like a heaviness from congestion couldn't sleep last night due to chills and aches No sore throat No otalgia  Hasn't tried anything thus far    Current Allergies (reviewed today): ! IBUPROFEN ! ASA ! VALIUM ! DARVON ! * CYMBALTA ! * RISPERDAL * SULFA (SULFONAMIDES) GROUP * SKELAXIN (METAXALONE) BUSPAR (BUSPIRONE HCL) CIPRO (CIPROFLOXACIN) EFFEXOR (VENLAFAXINE HCL) PERCOCET (OXYCODONE-ACETAMINOPHEN) LYRICA (PREGABALIN)  Past Medical History:    Reviewed history from 02/19/2007 and no changes required:       Chronic fatigue syndrome/fibromyalgia       Vitamin  B12 deficiency,       Obstipation-- put on high fiber diet       Depression       Hyperlipidemia       GERD/peptic ulcer       Migraine                Past Surgical History:    Reviewed history from 02/19/2007 and no changes required:       2005 Appendectomy       2004  Cholecystectomy       EGD- gastric ulcer       1986  [place]- [facility] for endometriosis       1990's TMJ       1990's Bunion       2006 Ingrown toenails       06/2005 Hypotension, blood transfusion--bleeding ulcer       Pneumonia 3/07       Accidental overdose  2007   Social History:    Reviewed history from 02/19/2007 and no changes required:       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  Has one grown daughter in  [place].         Smokes 1/2 ppd x 30 yrs.         Does not exercise.         Not in a relationship currently.         Owns a gun.       Current Smoker       Alcohol use-no       Drug use-no    Review of Systems       No nausea or voimting  No diarrhea Appetite is off   Physical Exam  General:     alert.  NAD Head:     no sinus tenderness Ears:     R ear normal and L ear normal.   Nose:     mild inflammation only Mouth:     no erythema and no exudates.   Neck:     supple and no cervical lymphadenopathy.   Lungs:  normal respiratory effort, normal breath sounds, no dullness, and no crackles.      Impression & Recommendations:  Problem # 1:  URI (ICD-465.9) Assessment: New discussed viral etiology Not flu  P: supportive care with tylenol     if worsening of productive cough, start augmentin  Complete Medication List: 1)  Glycolax Powd (Polyethylene glycol 3350) .... As needed 2)  Neurontin 600 Mg Tabs (Gabapentin) .... 4 by mouth at bedtime 3)  Xanax 1 Mg Tabs (Alprazolam) .... Take 1 tablet by mouth four times a day 4)  Calcium 600 1500 Mg Tabs (Calcium carbonate) .Marland Kitchen.. 1 by mouth daily 5)  Omeprazole 20 Mg Tbec (Omeprazole) .... 2 daily 6)  Fioricet 50-325-40 Mg Tabs (Butalbital-apap-caffeine) .... 2 at headache onset 7)  Darvocet A500 100-500 Mg Tabs (Propoxyphene n-apap) .... Take 1 by mouth qid as needed pain 8)  Fish Oil  .... Daily 9)  Amoxicillin-pot Clavulanate 875-125 Mg Tabs (Amoxicillin-pot clavulanate) .Marland Kitchen.. 1 two times a day   Patient Instructions: 1)  Keep regular follow up appointment 2)  Start the antibiotic only if you get much worse, like with more greeen mucus all day long    Prescriptions: AMOXICILLIN-POT CLAVULANATE 875-125 MG  TABS (AMOXICILLIN-POT CLAVULANATE) 1 two times a day  #20 x 0   Entered and Authorized by:   Cindee Salt MD   Signed by:   Cindee Salt MD on 07/13/2007   Method used:   Print then Give to  Patient   RxID:   [id number]  ] Current Allergies (reviewed today): ! IBUPROFEN ! ASA ! VALIUM ! DARVON ! * CYMBALTA ! * RISPERDAL * SULFA (SULFONAMIDES) GROUP * SKELAXIN (METAXALONE) BUSPAR (BUSPIRONE HCL) CIPRO (CIPROFLOXACIN) EFFEXOR (VENLAFAXINE HCL) PERCOCET (OXYCODONE-ACETAMINOPHEN) LYRICA (PREGABALIN) Current Medications (including changes made in today's visit):  GLYCOLAX  POWD (POLYETHYLENE GLYCOL 3350) as needed NEURONTIN 600 MG TABS (GABAPENTIN) 4 by mouth at bedtime XANAX 1 MG TABS (ALPRAZOLAM) Take 1 tablet by mouth four times a day CALCIUM 600 1500 MG TABS (CALCIUM CARBONATE) 1 by mouth daily OMEPRAZOLE 20 MG  TBEC (OMEPRAZOLE) 2 daily FIORICET 50-325-40 MG  TABS (BUTALBITAL-APAP-CAFFEINE) 2 at headache onset DARVOCET A500 100-500 MG  TABS (PROPOXYPHENE N-APAP) take 1 by mouth qid as needed pain * FISH OIL daily AMOXICILLIN-POT CLAVULANATE 875-125 MG  TABS (AMOXICILLIN-POT CLAVULANATE) 1 two times a day

## 2010-04-13 NOTE — Progress Notes (Signed)
Summary: Ebony Hail  Phone Note [facility]  on March 26, 2009 3:50 PM  Refills Requested: Medication #1:  XANAX 1 MG TABS Take 1 tablet by mouth four times a day   Last Refilled: 02/25/2009  Medication #2:  NORCO 5-325 MG TABS take 1 by mouth three times a day as needed.   Last Refilled: 02/09/2009 Form on your desk    Method Requested: Fax to Local Pharmacy Initial call taken by: DeShannon Smith CMA Duncan Dull),  March 26, 2009 3:50 PM  Follow-up for Phone Call        okay 1 month of each with no refills Follow-up by: Cindee Salt MD,  March 26, 2009 5:37 PM  Additional Follow-up for Phone Call Additional follow up Details #1::        Rx faxed to pharmacy Additional Follow-up by: DeShannon Smith CMA Duncan Dull),  March 26, 2009 5:38 PM    Prescriptions: NORCO 5-325 MG TABS (HYDROCODONE-ACETAMINOPHEN) take 1 by mouth three times a day as needed  #90 x 0   Entered by:   Mervin Hack CMA (AAMA)   Authorized by:   Cindee Salt MD   Signed by:   Mervin Hack CMA (AAMA) on 03/26/2009   Method used:   Handwritten   RxID:   [id number] XANAX 1 MG TABS (ALPRAZOLAM) Take 1 tablet by mouth four times a day  #120 x 0   Entered by:   Mervin Hack CMA (AAMA)   Authorized by:   Cindee Salt MD   Signed by:   Mervin Hack CMA (AAMA) on 03/26/2009   Method used:   Handwritten   RxID:   [id number]

## 2010-04-13 NOTE — Progress Notes (Signed)
Summary: refill request for xanax, norco  Phone Note Refill Request Message from:  Fax from Pharmacy  Refills Requested: Medication #1:  XANAX 1 MG TABS Take 1 tablet by mouth four times a day   Last Refilled: 09/24/2009  Medication #2:  NORCO 5-325 MG TABS take 1 by mouth three times a day as needed   Last Refilled: 09/24/2009 [facility] [address] [address] [place] are on your desk.  Initial call taken by: Lowella Petties CMA,  November 24, 2009 11:39 AM  Follow-up for Phone Call        each for 1 month as directed on the fax form Follow-up by: Cindee Salt MD,  November 24, 2009 1:21 PM  Additional Follow-up for Phone Call Additional follow up Details #1::        Rx faxed to pharmacy Additional Follow-up by: DeShannon Smith CMA Duncan Dull),  November 24, 2009 1:41 PM    Prescriptions: [name] 1 MG TABS (ALPRAZOLAM) Take 1 tablet by mouth four times a day  #120 x 0   Entered by:   Mervin Hack CMA (AAMA)   Authorized by:   Cindee Salt MD   Signed by:   Mervin Hack CMA (AAMA) on 11/24/2009   Method used:   Handwritten   RxID:   [id number] NORCO 5-325 MG TABS (HYDROCODONE-ACETAMINOPHEN) take 1 by mouth three times a day as needed  #90 x 0   Entered by:   Mervin Hack CMA (AAMA)   Authorized by:   Cindee Salt MD   Signed by:   Mervin Hack CMA (AAMA) on 11/24/2009   Method used:   Handwritten   RxID:   [id number]

## 2010-04-13 NOTE — Progress Notes (Signed)
  Phone Note [facility]  Refills Requested: Medication #1:  XANAX 1 MG TABS Take 1 tablet by mouth four times a day  Method Requested: Fax to Local Pharmacy Initial call taken by: Wandra Mannan,  August 13, 2007 9:35 AM  Follow-up for Phone Call        okay #120 x 0 Follow-up by: Cindee Salt MD,  August 13, 2007 1:20 PM  Additional Follow-up for Phone Call Additional follow up Details #1::        rx faxed Additional Follow-up by: Wandra Mannan,  August 13, 2007 1:31 PM      Prescriptions: [name] 1 MG TABS (ALPRAZOLAM) Take 1 tablet by mouth four times a day  #120 x 0   Entered by:   Wandra Mannan   Authorized by:   Cindee Salt MD   Signed by:   Wandra Mannan on 08/13/2007   Method used:   Handwritten   RxID:   [id number]

## 2010-04-13 NOTE — Progress Notes (Signed)
Summary: no imp since seen  Phone Note Call from Patient Call back at [phone]   Caller: Patient Call For: Gabreal Worton Summary of Call: pt was seen monday she says she is not getting better she is gettng worse, c/o weakness and sob Initial call taken by: Liane Comber,  November 24, 2006 11:30 AM  Follow-up for Phone Call        can offer visit at end of day but if very SOB she should call 911 for emergency eval Follow-up by: Cindee Salt MD,  November 24, 2006 1:41 PM  Additional Follow-up for Phone Call Additional follow up Details #1::        I tried to return call to pt but was informed she was not there she had left to come here.  I spoke with dr Alphonsus Sias and was told he could work her in at the end of the day and to have her have a cxr while waiting.  pt is now here so I will have her checked in and inform terrie of x ray. I did an order ..................................................................Marland KitchenLiane Comber  November 24, 2006 2:02 PM   New Problems: SOB (ICD-786.05)   Additional Follow-up for Phone Call Additional follow up Details #2::    noted Follow-up by: Cindee Salt MD,  November 24, 2006 2:08 PM  New Problems: SOB (ICD-786.05)

## 2010-04-13 NOTE — Assessment & Plan Note (Signed)
Summary: S.O.B.,HURTS ALL OVER/CLE   Vital Signs:  Patient Profile:   60 Years Old Female Weight:      108.13 pounds Temp:     97.6 degrees F oral Pulse rate:   76 / minute BP sitting:   86 / 50  (left arm)  Vitals Entered By: Wandra Mannan (November 20, 2006 2:50 PM)                 Chief Complaint:  SOB  hurts all over.  History of Present Illness: "The old fibro kicking in and just won't ease up" Got worse then usual over the last week and some Trying to walk but feels it made her worse  Taking darvocet--not really helping too much Feels it daytime and night Only sleeping 4-5 hours  Never tried  accupuncture--insurance will not cover Not happy with chiropractic in the past    Current Allergies (reviewed today): ! IBUPROFEN ! ASA ! VALIUM ! DARVON ! * CYMBALTA ! * RISPERDAL * SULFA (SULFONAMIDES) GROUP * SKELAXIN (METAXALONE) BUSPAR (BUSPIRONE HCL) CIPRO (CIPROFLOXACIN) EFFEXOR (VENLAFAXINE HCL) PERCOCET (OXYCODONE-ACETAMINOPHEN) LYRICA (PREGABALIN)  Past Medical History:    Reviewed history from 10/13/2006 and no changes required:       chronic fatigue syndrome/fibromyalgia        hosp for bleeding ulcer 4-07        hx of B12 deficiency,       Obstipation-- put on high fiber diet       pneumonia 3-07       hospitalized for accidental drug overdose 2007       Depression       Hyperlipidemia       GERD/peptic ulcer       Migraine       constipation              CONSULTANTS       Dr Larna Daughters     Review of Systems      See HPI       Eating about the same Weight is about the same   Physical Exam  General:     NAD, looks miserable Msk:     trigger points are mostly tender normal ROM, no joint tenderness, and no joint swelling.   Psych:     dysphoric affect and slightly anxious.      Impression & Recommendations:  Problem # 1:  FIBROMYALGIA (ICD-729.1) worse now will increase gabapentin to 2400 at bedtime continue darvocet  for as needed up to 4x/day  Her updated medication list for this problem includes:    Tramadol Hcl 50 Mg Tabs (Tramadol hcl) .Marland Kitchen... Take 1 tablet by mouth three times a day prn    Fioricet 50-325-40 Mg Tabs (Butalbital-apap-caffeine) .Marland Kitchen... 2 at headache onset    Darvocet A500 100-500 Mg Tabs (Propoxyphene n-apap) .Marland Kitchen... Prn   Complete Medication List: 1)  Glycolax Powd (Polyethylene glycol 3350) .... As needed 2)  Neurontin 600 Mg Tabs (Gabapentin) .... Four by mouth at bedtime 3)  Estradiol 2 Mg Tabs (Estradiol) 4)  Xanax 1 Mg Tabs (Alprazolam) .... Take 1 tablet by mouth four times a day 5)  Fluoxetine Hcl 20 Mg Caps (Fluoxetine hcl) .... Take 1 capsule by mouth once a day 6)  Calcium 600 1500 Mg Tabs (Calcium carbonate) .Marland Kitchen.. 1 by mouth daily 7)  Omeprazole 20 Mg Tbec (Omeprazole) .... 2 daily 8)  Tramadol Hcl 50 Mg Tabs (Tramadol hcl) .... Take 1 tablet by mouth three  times a day prn 9)  Fioricet 50-325-40 Mg Tabs (Butalbital-apap-caffeine) .... 2 at headache onset 10)  Darvocet A500 100-500 Mg Tabs (Propoxyphene n-apap) .... Prn   Patient Instructions: 1)  Keep November appointment    Prescriptions: NEURONTIN 600 MG TABS (GABAPENTIN) four by mouth at bedtime  #120 x 12   Entered and Authorized by:   Cindee Salt MD   Signed by:   Cindee Salt MD on 11/20/2006   Method used:   Print then Give to Patient   RxID:   [id number]   Current Allergies (reviewed today): ! IBUPROFEN ! ASA ! VALIUM ! DARVON ! * CYMBALTA ! * RISPERDAL * SULFA (SULFONAMIDES) GROUP * SKELAXIN (METAXALONE) BUSPAR (BUSPIRONE HCL) CIPRO (CIPROFLOXACIN) EFFEXOR (VENLAFAXINE HCL) PERCOCET (OXYCODONE-ACETAMINOPHEN) LYRICA (PREGABALIN) Current Medications (including changes made in today's visit):  GLYCOLAX  POWD (POLYETHYLENE GLYCOL 3350) as needed NEURONTIN 600 MG TABS (GABAPENTIN) four by mouth at bedtime ESTRADIOL 2 MG TABS (ESTRADIOL)  XANAX 1 MG TABS (ALPRAZOLAM) Take 1 tablet  by mouth four times a day FLUOXETINE HCL 20 MG CAPS (FLUOXETINE HCL) Take 1 capsule by mouth once a day CALCIUM 600 1500 MG TABS (CALCIUM CARBONATE) 1 by mouth daily OMEPRAZOLE 20 MG  TBEC (OMEPRAZOLE) 2 daily TRAMADOL HCL 50 MG  TABS (TRAMADOL HCL) Take 1 tablet by mouth three times a day prn FIORICET 50-325-40 MG  TABS (BUTALBITAL-APAP-CAFFEINE) 2 at headache onset DARVOCET A500 100-500 MG  TABS (PROPOXYPHENE N-APAP) prn

## 2010-04-13 NOTE — Miscellaneous (Signed)
  Clinical Lists Changes  Medications: Changed medication from NEURONTIN 600 MG TABS (GABAPENTIN) one by mouth at bedtime to NEURONTIN 600 MG TABS (GABAPENTIN) three by mouth at bedtime     Prior Medications: COLACE 100 MG CAPS (DOCUSATE SODIUM) as needed GLYCOLAX  POWD (POLYETHYLENE GLYCOL 3350) as needed DARVOCET-N 100 100-650 MG TABS (PROPOXYPHENE N-APAP) one-two by mouth daily as needed NEXIUM 40 MG CPDR (ESOMEPRAZOLE MAGNESIUM) Take 1 tablet by mouth once a day ESTRADIOL 2 MG TABS (ESTRADIOL)  MAXALT 10 MG TABS (RIZATRIPTAN BENZOATE) Take 1 tablet by mouth once a day XANAX 1 MG TABS (ALPRAZOLAM) Take 1 tablet by mouth four times a day TRIAMCINOLONE ACETONIDE 0.1 % CREA (TRIAMCINOLONE ACETONIDE) Apply 1 a small amount to affected area three times a day FLUOXETINE HCL 20 MG CAPS (FLUOXETINE HCL) Take 1 capsule by mouth once a day CALCIUM 600 1500 MG TABS (CALCIUM CARBONATE) 1 by mouth daily BENADRYL 25 MG CAPS (DIPHENHYDRAMINE HCL) as needed PROMETHAZINE HCL 25 MG TABS (PROMETHAZINE HCL) as needed Current Allergies: ! IBUPROFEN ! ASA ! VALIUM ! DARVON ! * CYMBALTA ! * RISPERDAL * SULFA (SULFONAMIDES) GROUP * SKELAXIN (METAXALONE) BUSPAR (BUSPIRONE HCL) CIPRO (CIPROFLOXACIN) EFFEXOR (VENLAFAXINE HCL) PERCOCET (OXYCODONE-ACETAMINOPHEN)

## 2010-04-13 NOTE — Assessment & Plan Note (Signed)
Summary: FU   Vital Signs:  Patient Profile:   60 Years Old Female Weight:      113 pounds Temp:     98.4 degrees F oral Pulse rate:   68 / minute Pulse rhythm:   regular BP sitting:   100 / 68  (left arm) Cuff size:   regular  Vitals Entered By: Mervin Hack CMA (April 07, 2008 11:11 AM)                 Chief Complaint:  follow-up.  History of Present Illness: "not doing too well" Having pain in right thigh--needs to use heating pad Pain in left neck also--goes down to shoulder  Occ pain in left elbow that radiates up to shoulder  No obvious precipitating factors for any of these pains uses all her meds still  Stil has stress and anxiety attacks "I could blow out the top of my head" Feels her heart will beat out her chest and has choking sensation xanax may give relief but often lasts until she takes at least 2 of them  Not much relief of her pain from darvocet Still takes it since "it is all I have"  Has tried some exercise but really hasn't been able to do much      Current Allergies (reviewed today): ! IBUPROFEN ! ASA ! VALIUM ! DARVON ! * CYMBALTA ! * RISPERDAL * SULFA (SULFONAMIDES) GROUP * SKELAXIN (METAXALONE) BUSPAR (BUSPIRONE HCL) CIPRO (CIPROFLOXACIN) EFFEXOR PERCOCET (OXYCODONE-ACETAMINOPHEN) LYRICA (PREGABALIN) TRAMADOL HCL (TRAMADOL HCL)  Past Medical History:    Reviewed history from 02/19/2007 and no changes required:       Chronic fatigue syndrome/fibromyalgia       Vitamin  B12 deficiency,       Obstipation-- put on high fiber diet       Depression       Hyperlipidemia       GERD/peptic ulcer       Migraine                Past Surgical History:    Reviewed history from 02/19/2007 and no changes required:       2005 Appendectomy       2004  Cholecystectomy       EGD- gastric ulcer       1986  [place]- bso for endometriosis       1990's TMJ       1990's Bunion       2006 Ingrown toenails       06/2005 Hypotension,  blood transfusion--bleeding ulcer       Pneumonia 3/07       Accidental overdose  2007   Social History:    Reviewed history from 02/19/2007 and no changes required:       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  Has one grown daughter in [place].         Smokes 1/2 ppd x 30 yrs.         Does not exercise.         Not in a relationship currently.         Owns a gun.       Current Smoker       Alcohol use-no       Drug use-no    Review of Systems       The patient complains of chest pain.  The patient denies syncope, dyspnea on exertion, and abdominal pain.  sleep is poor in general--often as little as 1 hour in a night appetite is fine No weight changes some dizziness when getting up but no syncope Stomach fine as long as she sticks with her miralax (uses as needed only)   Physical Exam  General:     NADalert.   Neck:     supple, full ROM, and no masses.   Lungs:     normal respiratory effort and normal breath sounds.   Heart:     normal rate, regular rhythm, no murmur, and no gallop.   Abdomen:     soft and non-tender.   Msk:     no joint tenderness and no joint swelling.   Full ROM of right knee and hip Extremities:     no edema Neurologic:     alert & oriented X3 and strength normal in all extremities.   Psych:     normally interactive, good eye contact, and slightly anxious.      Impression & Recommendations:  Problem # 1:  FIBROMYALGIA (ICD-729.1) Assessment: Unchanged fairly severe and has been intolerant to multiple meds steady state though with daily symptoms Jiust not able to do any exercise  Her updated medication list for this problem includes:    Fioricet 50-325-40 Mg Tabs (Butalbital-apap-caffeine) .Marland Kitchen... 2 at headache onset    Darvocet-n 100 100-650 Mg Tabs (Propoxyphene n-apap) .Marland Kitchen... Take 1 tablet 4 times a day as needed for severe pain   Problem # 2:  DEPRESSION (ICD-311) Assessment:  Unchanged dysphoric mostly anxiety and panic  Her updated medication list for this problem includes:    Xanax 1 Mg Tabs (Alprazolam) .Marland Kitchen... Take 1 tablet by mouth four times a day   Problem # 3:  FATIGUE, CHRONIC (ICD-780.79) Assessment: Unchanged will recheck labs fasting today and sugar up a bit 6-7 months ago Orders: Venipuncture (29562) TLB-Renal Function Panel (80069-RENAL) TLB-CBC Platelet - w/Differential (85025-CBCD) TLB-Hepatic/Liver Function Pnl (80076-HEPATIC) TLB-TSH (Thyroid Stimulating Hormone) (84443-TSH) TLB-Sedimentation Rate (ESR) (85652-ESR)   Problem # 4:  ANXIETY (ICD-300.00) Assessment: Unchanged gets reasonable control of panic with the xanax  Her updated medication list for this problem includes:    Xanax 1 Mg Tabs (Alprazolam) .Marland Kitchen... Take 1 tablet by mouth four times a day   Complete Medication List: 1)  Glycolax Powd (Polyethylene glycol 3350) .... As needed 2)  Neurontin 600 Mg Tabs (Gabapentin) .... 4 by mouth at bedtime 3)  Xanax 1 Mg Tabs (Alprazolam) .... Take 1 tablet by mouth four times a day 4)  Calcium 600 1500 Mg Tabs (Calcium carbonate) .Marland Kitchen.. 1 by mouth daily 5)  Omeprazole 20 Mg Tbec (Omeprazole) .... 2 daily 6)  Fioricet 50-325-40 Mg Tabs (Butalbital-apap-caffeine) .... 2 at headache onset 7)  Darvocet-n 100 100-650 Mg Tabs (Propoxyphene n-apap) .... Take 1 tablet 4 times a day as needed for severe pain 8)  Eq Vegetable Laxative 8.6 Mg Tabs (Sennosides) .... 2 tablets at bedtime   Patient Instructions: 1)  Please schedule a follow-up appointment in 3 months.    Current Allergies (reviewed today): ! IBUPROFEN ! ASA ! VALIUM ! DARVON ! * CYMBALTA ! * RISPERDAL * SULFA (SULFONAMIDES) GROUP * SKELAXIN (METAXALONE) BUSPAR (BUSPIRONE HCL) CIPRO (CIPROFLOXACIN) EFFEXOR PERCOCET (OXYCODONE-ACETAMINOPHEN) LYRICA (PREGABALIN) TRAMADOL HCL (TRAMADOL HCL)

## 2010-04-13 NOTE — Progress Notes (Signed)
Summary: Rx Orphenadrine and Butalb-Acetamin  Phone Note Refill Request Call back at [phone] [facility] [phone] on January 04, 2010 12:05 PM  Refills Requested: Medication #1:  BUTALBITAL-APAP-CAFFEINE 50-325-40 MG CAPS Take 2 tablets at onset of migraine headache  Medication #2:  ORPHENADRINE CITRATE CR 100 MG XR12H-TAB take 1 by mouth two times a day Received e-scribe refill request from pharmacy   Method Requested: Telephone to Pharmacy Initial call taken by: Sydell Axon LPN,  January 04, 2010 12:07 PM  Follow-up for Phone Call        okay orphenadrine #60 x 0 butalbitol #30 x 0 Follow-up by: Cindee Salt MD,  January 04, 2010 1:42 PM  Additional Follow-up for Phone Call Additional follow up Details #1::        Rx called to pharmacy Additional Follow-up by: Sydell Axon LPN,  January 04, 2010 3:13 PM    Prescriptions: ORPHENADRINE CITRATE CR 100 MG XR12H-TAB (ORPHENADRINE CITRATE) take 1 by mouth two times a day  #60 Tablet x 0   Entered by:   Sydell Axon LPN   Authorized by:   Cindee Salt MD   Signed by:   Sydell Axon LPN on 62/95/2841   Method used:   Telephoned to ...       [facility]  [place] [phone][facility] (retail[facility]       [address] [place], [place]  [address]       Ph: [phone] or [phone]       Fax: [phone]   RxID:   [id number] BUTALBITAL-APAP-CAFFEINE 50-325-40 MG CAPS [facility]) Take 2 tablets at onset of migraine headache  #30 Tablet x 0   Entered by:   Sydell Axon LPN   Authorized by:   Cindee Salt MD   Signed by:   Sydell Axon LPN on 23/55/7322   Method used:   Telephoned to ...       [facility]  [place] [phone]* (retail)       [address] [place], [place]  [address]       Ph: [phone] or [phone]       Fax: [phone]   RxID:   [id number]

## 2010-04-13 NOTE — Progress Notes (Signed)
Summary: breast pain  Phone Note Call from Patient   Caller: Patient Summary of Call: Pt complains of pain in her right breast, worse with coughing or sneezing or laughing.  Offered appt, she is going to urgent care in [place]. Initial call taken by: Lowella Petties,  Jul 25, 2008 9:03 AM

## 2010-04-13 NOTE — Progress Notes (Signed)
Summary: Rx-Alprazolam  Phone Note Refill Request Message from:  Fax from Pharmacy on October 09, 2007 12:25 PM  Refills Requested: Medication #1:  XANAX 1 MG TABS Take 1 tablet by mouth four times a day   Last Refilled: 09/10/2007 [phone]  Initial call taken by: Silas [place],  October 09, 2007 12:26 PM  Follow-up for Phone Call        okay #120 x 0 Follow-up by: Cindee Salt MD,  October 09, 2007 2:04 PM  Additional Follow-up for Phone Call Additional follow up Details #1::        faxed to pharmacy Additional Follow-up by: Silas [place],  October 09, 2007 2:09 PM      Prescriptions: [name] 1 MG TABS (ALPRAZOLAM) Take 1 tablet by mouth four times a day  #120 x 0   Entered by:   Silas Sacramento   Authorized by:   Cindee Salt MD   Signed by:   Silas [place] on 10/09/2007   Method used:   Telephoned to ...       [facility]  [address]       [address] [place], [place]  [address]       Ph: [phone] or [phone]       Fax: [phone]   RxID:   [id number]  faxed to pharmacy

## 2010-04-13 NOTE — Assessment & Plan Note (Signed)
Summary: L arm pain rm 2   Vital Signs:  Patient Profile:   60 Years Old Female CC:      L arm pain no better Height:     63 inches Weight:      122 pounds O2 Sat:      100 % O2 treatment:    Room Air Temp:     96.7 degrees F oral Pulse rate:   79 / minute Pulse rhythm:   regular Resp:     16 per minute BP sitting:   106 / 67  (right arm) Cuff size:   regular  Vitals Entered By: Areta Haber CMA (April 13, 2009 2:30 PM)                  Current Allergies: ! IBUPROFEN ! ASA ! VALIUM ! DARVON ! * CYMBALTA ! * RISPERDAL ! CODEINE * SULFA (SULFONAMIDES) GROUP * SKELAXIN (METAXALONE) BUSPAR (BUSPIRONE HCL) CIPRO (CIPROFLOXACIN) EFFEXOR LYRICA (PREGABALIN) TRAMADOL HCL (TRAMADOL HCL) PERCOCET (OXYCODONE-ACETAMINOPHEN)History of Present Illness Chief Complaint: L arm pain no better History of Present Illness: Pt was seen here three days ago with complaints of left scapular and upper arm pain.  No history of trauma at all.   She was also complaining of leg pain, but has since resolved.   Presents with complaints of continued left scapular pain, left upper arm pain and pain in left forearm.   Pain is reproducible with muscle palpation. No complaints of neck pain.  No "electrical" quality.   No worsening with overhead reaching.   Improved with heat.   Hydrocodone does not help.  Says "arm just aches like a toothache" all the time.  She does have a history of fibromyalgia.   Arm has been bothering her for abourt a month now.      Current Problems: SHOULDER PAIN, LEFT (ICD-719.41) LEG PAIN, LEFT (ICD-729.5) OSTEOPENIA (ICD-733.90) PREVENTIVE HEALTH CARE (ICD-V70.0) DEPRESSION (ICD-311) GERD (ICD-530.81) CONSTIPATION (ICD-564.00) ANXIETY (ICD-300.00) SYMPTOM, DISTURBANCE, SLEEP NOS (ICD-780.50) FATIGUE, CHRONIC (ICD-780.79) FIBROMYALGIA (ICD-729.1) RHINITIS, ALLERGIC (ICD-477.9) PANIC ATTACKS (ICD-300.01) MIGRAINE, UNSPEC., W/O INTRACTABLE MIGRAINE  (ICD-346.90) MENOPAUSAL SYNDROME (ICD-627.2) HYPERLIPIDEMIA (ICD-272.4) ANEMIA, IRON DEFICIENCY, UNSPEC. (ICD-280.9)   Current Meds NEURONTIN 600 MG TABS (GABAPENTIN) 4 by mouth at bedtime XANAX 1 MG TABS (ALPRAZOLAM) Take 1 tablet by mouth four times a day CALCIUM 600 1500 MG TABS (CALCIUM CARBONATE) 1 by mouth daily OMEPRAZOLE 20 MG  TBEC (OMEPRAZOLE) 2 daily FIORICET 50-325-40 MG  TABS (BUTALBITAL-APAP-CAFFEINE) 2 at headache onset ORPHENADRINE CITRATE CR 100 MG XR12H-TAB (ORPHENADRINE CITRATE) take 1 by mouth two times a day NORCO 5-325 MG TABS (HYDROCODONE-ACETAMINOPHEN) take 1 by mouth three times a day as needed PREDNISONE 20 MG TABS (PREDNISONE) 60 mg by mouth once daily for 3 days then 40 mg by mouth once daily for 3 days then 20 mg by mouth once daily for 3 days. CYCLOBENZAPRINE HCL 10 MG  TABS (CYCLOBENZAPRINE HCL) 1 by mouth 2 times daily as needed for pain  REVIEW OF SYSTEMS Constitutional Symptoms      Denies fever, chills, night sweats, weight loss, weight gain, and fatigue.  Eyes       Denies change in vision, eye pain, eye discharge, glasses, contact lenses, and eye surgery. Ear/Nose/Throat/Mouth       Denies hearing loss/aids, change in hearing, ear pain, ear discharge, dizziness, frequent runny nose, frequent nose bleeds, sinus problems, sore throat, hoarseness, and tooth pain or bleeding.  Respiratory       Denies dry cough, productive  cough, wheezing, shortness of breath, asthma, bronchitis, and emphysema/COPD.  Cardiovascular       Denies murmurs, chest pain, and tires easily with exhertion.    Gastrointestinal       Denies stomach pain, nausea/vomiting, diarrhea, constipation, blood in bowel movements, and indigestion. Genitourniary       Denies painful urination, kidney stones, and loss of urinary control. Neurological       Denies paralysis, seizures, and fainting/blackouts. Musculoskeletal       Complains of decreased range of motion.      Denies muscle  pain, joint pain, joint stiffness, redness, swelling, muscle weakness, and gout.      Comments: L arm pain Skin       Denies bruising, unusual mles/lumps or sores, and hair/skin or nail changes.  Psych       Denies mood changes, temper/anger issues, anxiety/stress, speech problems, depression, and sleep problems. Other Comments: Pt states that her L arm pain has not gotten any better. Pt states that her PCP advise her to got to Urgent care to be seen.   Past History:  Past Medical History: Last updated: 04/10/2009 Chronic fatigue syndrome/fibromyalgia Obstipation-- put on high fiber diet Depression Hyperlipidemia GERD/peptic ulcer Migraine Osteopenia  Past Surgical History: Last updated: 04/10/2009 2005 Appendectomy 2004  Cholecystectomy EGD- Gastric ulcer 1986  TAH-BSO for endometriosis 1990's TMJ 1990's Bunion 2006 Ingrown toenails 06/2005 Hypotension, blood transfusion--bleeding ulcer Pneumonia 3/07  Family History: Last updated: 05/28/2007 5 sisters- HTN, DM, breast cancer (post-menop) 1 died of MI (had DM) father died of heart dz in 57s  mother died of AMI, stroke in 66s CAD-- Mom and Dad HBP:  1 or 2 sisters DM:  1 sister Breast CA:  1 sister Lung Cancer and Prostate CA:  in Aunts and Uncles  Social History: Last updated: 04/10/2009 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  Has one grown daughter in [place].  Smokes 1/2 ppd x 30 yrs.  Does not exercise.   Current Smoker Alcohol use-no Drug use-no  Risk Factors: Smoking Status: current (08/15/2006) Physical Exam Oral/Pharynx: tongue normal, posterior pharynx without erythema or exudate Chest/Lungs: no rales, wheezes, or rhonchi bilateral, breath sounds equal without effort Heart: regular rate and  rhythm, no murmur Left shoulder:  Full range of motion.   Negative impingement maneuvers.  Tender in the left scapular region and left deltoid bursae region.  Mild tenderness  in lateral epicondyle with forced wrist extension.   Normal sensory and grip.   Plan New Medications/Changes: CYCLOBENZAPRINE HCL 10 MG  TABS (CYCLOBENZAPRINE HCL) 1 by mouth 2 times daily as needed for pain  #20 x 0, 04/13/2009, Kathrine Haddock MD PREDNISONE 20 MG TABS (PREDNISONE) 60 mg by mouth once daily for 3 days then 40 mg by mouth once daily for 3 days then 20 mg by mouth once daily for 3 days.  #qs x 0, 04/13/2009, Kathrine Haddock MD  New Orders: Est. Patient Level III [phone] Planning Comments:   Warm moist heat to arm Home exercises prednisone taper as directed flexeril three times a day Continue hydrocodone 5-10 mg at night as needed for pain Follow up with Fibromyalgia md next week.   The patient and/or caregiver has been counseled thoroughly with regard to medications prescribed including dosage, schedule, interactions, rationale for use, and possible side effects and they verbalize understanding.  Diagnoses and expected course of recovery discussed and will return if not improved as expected or if the  condition worsens. Patient and/or caregiver verbalized understanding.  Prescriptions: CYCLOBENZAPRINE HCL 10 MG  TABS (CYCLOBENZAPRINE HCL) 1 by mouth 2 times daily as needed for pain  #20 x 0   Entered and Authorized by:   Kathrine Haddock MD   Signed by:   Kathrine Haddock MD on 04/13/2009   Method used:   Print then Give to Patient   RxID:   [id number] PREDNISONE 20 MG TABS (PREDNISONE) 60 mg by mouth once daily for 3 days then 40 mg by mouth once daily for 3 days then 20 mg by mouth once daily for 3 days.  #qs x 0   Entered and Authorized by:   Kathrine Haddock MD   Signed by:   Kathrine Haddock MD on 04/13/2009   Method used:   Print then Mail to Patient   RxID:   [id number]

## 2010-04-13 NOTE — Assessment & Plan Note (Signed)
Summary: CHEST (rm 3)   Vital Signs:  Patient Profile:   60 Years Old Female CC:      right sided chest pain Height:     63 inches Weight:      112 pounds O2 Sat:      96 % O2 treatment:    Room Air Temp:     97.6 degrees F oral Pulse rate:   65 / minute Resp:     16 per minute BP sitting:   89 / 56  (left arm) Cuff size:   regular  Pt. in pain?   yes    Location:   right chest    Intensity:   5    Type:       sharp  Vitals Entered By: Lajean Saver RN (February 15, 2010 1:55 PM)                   Updated Prior Medication List: NEURONTIN 600 MG TABS (GABAPENTIN) 4 by mouth at bedtime XANAX 1 MG TABS (ALPRAZOLAM) Take 1 tablet by mouth four times a day NORCO 5-325 MG TABS (HYDROCODONE-ACETAMINOPHEN) take 1 by mouth three times a day as needed BUTALBITAL-APAP-CAFFEINE 50-325-40 MG CAPS (BUTALBITAL-APAP-CAFFEINE) Take 2 tablets at onset of migraine headache CALCIUM 600 1500 MG TABS (CALCIUM CARBONATE) 1 by mouth daily ORPHENADRINE CITRATE CR 100 MG XR12H-TAB (ORPHENADRINE CITRATE) take 1 by mouth two times a day  Current Allergies (reviewed today): ! IBUPROFEN ! ASA ! VALIUM ! DARVON ! * CYMBALTA ! * RISPERDAL ! CODEINE * SULFA (SULFONAMIDES) GROUP * SKELAXIN (METAXALONE) BUSPAR CIPRO (CIPROFLOXACIN) EFFEXOR LYRICA (PREGABALIN) TRAMADOL HCL (TRAMADOL HCL) PERCOCET (OXYCODONE-ACETAMINOPHEN)History of Present Illness Chief Complaint: right sided chest pain History of Present Illness:  Subjective:  Patient complains of one week history of sharp pain in her right anterior/lateral chest, worse with movement and deep inspiration.  She is concerned that she may have a recurrent pneumonia, although she does not have a cough.  She believes that she may have had some fever/chills about a week ago, now resolved.   No shortness of breath, but has difficulty taking a deep breath.  No rash.  Remainder of review of systems negative.  REVIEW OF SYSTEMS Constitutional  Symptoms      Denies fever, chills, night sweats, weight loss, weight gain, and fatigue.  Eyes       Denies change in vision, eye pain, eye discharge, glasses, contact lenses, and eye surgery. Ear/Nose/Throat/Mouth       Denies hearing loss/aids, change in hearing, ear pain, ear discharge, dizziness, frequent runny nose, frequent nose bleeds, sinus problems, sore throat, hoarseness, and tooth pain or bleeding.  Respiratory       Complains of shortness of breath.      Denies dry cough, productive cough, wheezing, asthma, bronchitis, and emphysema/COPD.  Cardiovascular       Complains of chest pain.      Denies murmurs and tires easily with exhertion.      Comments: right side   Gastrointestinal       Denies stomach pain, nausea/vomiting, diarrhea, constipation, blood in bowel movements, and indigestion. Genitourniary       Denies painful urination, kidney stones, and loss of urinary control. Neurological       Denies paralysis, seizures, and fainting/blackouts. Musculoskeletal       Denies muscle pain, joint pain, joint stiffness, decreased range of motion, redness, swelling, muscle weakness, and gout.  Skin       Denies bruising,  unusual mles/lumps or sores, and hair/skin or nail changes.  Psych       Denies mood changes, temper/anger issues, anxiety/stress, speech problems, depression, and sleep problems. Other Comments: Patient c/o right sided CP that radiates from her right breast into her upper back. It started about 1 week ago, was intermittent but is now constant. She felt like she may have had a fever last night. She denies being sick recently   Past History:  Past Medical History: Reviewed history from 04/10/2009 and no changes required. Chronic fatigue syndrome/fibromyalgia Obstipation-- put on high fiber diet Depression Hyperlipidemia GERD/peptic ulcer Migraine Osteopenia  Past Surgical History: Reviewed history from 04/10/2009 and no changes required. 2005  Appendectomy 2004  Cholecystectomy EGD- Gastric ulcer 1986  TAH-BSO for endometriosis 1990's TMJ 1990's Bunion 2006 Ingrown toenails 06/2005 Hypotension, blood transfusion--bleeding ulcer Pneumonia 3/07  Family History: Reviewed history from 05/28/2007 and no changes required. 5 sisters- HTN, DM, breast cancer (post-menop) 1 died of MI (had DM) father died of heart dz in 11s  mother died of AMI, stroke in 22s CAD-- Mom and Dad HBP:  1 or 2 sisters DM:  1 sister Breast CA:  1 sister Lung Cancer and Prostate CA:  in Aunts and Uncles  Social History: Reviewed history from 04/10/2009 and no changes required. 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  Has one grown daughter in [place].  Smokes 1/2 ppd x 30 yrs.  Does not exercise.   Current Smoker Alcohol use-no Drug use-no   Objective:  Appearance:  Patient appears healthy, stated age, and in no acute distress  Eyes:  Pupils are equal, round, and reactive to light and accomdation.  Extraocular movement is intact.  Conjunctivae are not inflamed.  Pharynx:  Normal  Neck:  Supple.  No adenopathy is present.  No thyromegaly is present  Lungs:  Clear to auscultation.  Breath sounds are equal.  Chest:  Distinct tenderness over the sternum, maximal at the sternal notch.  Palpation of the sternum recreates her discomfort.  Tenderness extends laterally to the right somewhat. Heart:  Regular rate and rhythm without murmurs, rubs, or gallops.  Abdomen:  Nontender without masses or hepatosplenomegaly.  Bowel sounds are present.  No CVA or flank tenderness.  Extremities:  No edema.  Pedal pulses are full and equal.  No lower leg tenderness  Skin:  No rash Chest X-ray with rib detail negative. Assessment New Problems: COSTOCHONDRITIS, ACUTE (ICD-733.6) CHEST PAIN, RIGHT (ICD-786.50)   Plan New Medications/Changes: VOLTAREN 1 % GEL (DICLOFENAC SODIUM) Apply two gm to affected area two times a day to  three times a day  #100gm x 0, 02/15/2010, Donna Christen MD  New Orders: T-DG Ribs Unilateral w/Chest*R* [71101] New Patient Level III [99203] Planning Comments:   Rx vor Voltaren gel.  Apply heat pad to chest several times daily.  Given a Water quality scientist patient information and instruction sheet on topic costochondritis.  Call if rash develops (?shingles) Follow-up with PCP if not improving.   The patient and/or caregiver has been counseled thoroughly with regard to medications prescribed including dosage, schedule, interactions, rationale for use, and possible side effects and they verbalize understanding.  Diagnoses and expected course of recovery discussed and will return if not improved as expected or if the condition worsens. Patient and/or caregiver verbalized understanding.  Prescriptions: VOLTAREN 1 % GEL (DICLOFENAC SODIUM) Apply two gm to affected area two times a day to three times a day  #100gm x 0  Entered and Authorized by:   Donna Christen MD   Signed by:   Donna Christen MD on 02/15/2010   Method used:   Print then Give to Patient   RxID:   [id number]   Orders Added: 1)  T-DG Ribs Unilateral w/Chest*R* [71101] 2)  New Patient Level III [40102]

## 2010-04-13 NOTE — Progress Notes (Signed)
Summary: refill request for xanax  Phone Note Refill Request Message from:  Fax from Pharmacy  Refills Requested: Medication #1:  XANAX 1 MG TABS Take 1 tablet by mouth four times a day   Last Refilled: 12/05/2007 [facility] [address] [place]  Initial call taken by: Lowella Petties,  January 01, 2008 2:49 PM  Follow-up for Phone Call        okay #120 x 0 Follow-up by: Cindee Salt MD,  January 02, 2008 7:52 AM  Additional Follow-up for Phone Call Additional follow up Details #1::        Rx faxed to pharmacy. Additional Follow-up by: Sydell Axon,  January 02, 2008 10:21 AM      Prescriptions: Lindsey Ward 1 MG TABS (ALPRAZOLAM) Take 1 tablet by mouth four times a day  #120 x 0   Entered by:   Sydell Axon   Authorized by:   Cindee Salt MD   Signed by:   Sydell Axon on 01/02/2008   Method used:   Handwritten   RxID:   [id number]

## 2010-04-13 NOTE — Progress Notes (Signed)
Summary: Rx-Darvocet  Phone Note Refill Request Message from:  Fax from Pharmacy on October 02, 2007 10:56 AM  Refills Requested: Medication #1:  DARVOCET A500 100-500 MG  TABS take 1 by mouth qid as needed pain.   Last Refilled: 09/17/2007 [facility] [address] [address] [phone]  Initial call taken by: Silas [place],  October 02, 2007 10:57 AM  Follow-up for Phone Call        okay #60 x 0 Follow-up by: Cindee Salt MD,  October 02, 2007 1:36 PM  Additional Follow-up for Phone Call Additional follow up Details #1::        called in med Additional Follow-up by: Silas [place],  October 02, 2007 1:39 PM      Prescriptions: DARVOCET A500 100-500 MG  TABS (PROPOXYPHENE N-APAP) take 1 by mouth qid as needed pain  #60 x 0   Entered by:   Silas [place]   Authorized by:   Cindee Salt MD   Signed by:   Silas [place] on 10/02/2007   Method used:   Telephoned to ...       [facility]  [address] [id number]       [address] [place], [place]         Ph: [phone] or [phone]       Fax: [phone]   RxID:   [id number]

## 2010-04-13 NOTE — Progress Notes (Signed)
Summary: Pneumonia   Phone Note Call from Patient Call back at Home Phone [phone]   Caller: Patient Call For: Cindee Salt MD Summary of Call: Patient says that she feels like she has Pneumonia. She wanted to let you know that she is going to [facility].  Initial call taken by: Melody Comas,  February 15, 2010 10:46 AM  Follow-up for Phone Call        Please check on her tomorrow Cindee Salt MD  February 15, 2010 1:40 PM   pt was seen today, see OV and x-ray. left message on machine at home for patient to return my call.  DeShannon Smith CMA Duncan Dull)  February 15, 2010 3:59 PM   Okay check on her tomorrow if she doesn't call back Cindee Salt MD  February 15, 2010 4:01 PM   patient came in the office, pt states she feels a little better and should be able to hold off until her CPX Thursday. DeShannon Smith CMA Duncan Dull)  February 16, 2010 2:21 PM    okay Follow-up by: Cindee Salt MD,  February 17, 2010 7:22 AM

## 2010-04-13 NOTE — Progress Notes (Signed)
Summary:  FIORICET   Phone Note Refill Request Message from:  [phone] on March 03, 2009 10:46 AM  Refills Requested: Medication #1:  FIORICET 50-325-40 MG  TABS 2 at headache onset E-Scribe Request    Method Requested: Telephone to Pharmacy Initial call taken by: DeShannon Katrinka Blazing CMA Duncan Dull),  March 03, 2009 10:46 AM  Follow-up for Phone Call        okay #30 x 0 Follow-up by: Cindee Salt MD,  March 03, 2009 2:12 PM  Additional Follow-up for Phone Call Additional follow up Details #1::        Rx faxed to pharmacy Additional Follow-up by: DeShannon Katrinka Blazing CMA Duncan Dull),  March 03, 2009 3:08 PM    Prescriptions: Marikay Alar 50-325-40 MG  TABS [facility]) 2 at headache onset  #30 Tablet x 0   Entered by:   Mervin Hack CMA (AAMA)   Authorized by:   Cindee Salt MD   Signed by:   Mervin Hack CMA (AAMA) on 03/03/2009   Method used:   Electronically to        [facility] [phone][facility] (retail)       [address] [place], [place]  [address]       Ph: [phone] or [phone]       Fax: [phone]   RxID:   [id number]

## 2010-04-13 NOTE — Assessment & Plan Note (Signed)
Summary: ROA 4 MTHS CYD   Vital Signs:  Patient profile:   60 year old female Weight:      114 pounds Temp:     97.6 degrees F oral Pulse rate:   76 / minute Pulse rhythm:   regular BP sitting:   110 / 80  (left arm) Cuff size:   regular  Vitals Entered By: Mervin Hack CMA Duncan Dull) (June 26, 2009 12:18 PM) CC: 4 month follow-up/ congestion/UTI?   History of Present Illness: Not doing well today started with runny nose yesterday--esp if leans forward Clear drainage some dry mouth Feels "weak" No fever no sig cough Some SOB---not really new though No itching in nose or eyes No history  still with occ dizziness--when she gets up occ when walking--has to stop briefly then it passes  Has tried to cut down on pain pills None recently  Has noted some terminal dysuria irregular stream--doesn't end properly tried increased fluids lingering over a couple of weeks no hematuria   Allergies: 1)  ! Ibuprofen 2)  ! Asa 3)  ! Valium 4)  ! Darvon 5)  ! * Cymbalta 6)  ! * Risperdal 7)  ! Codeine 8)  * Sulfa (Sulfonamides) Group 9)  * Skelaxin (Metaxalone) 10)  Buspar 11)  Cipro (Ciprofloxacin) 12)  Effexor 13)  Lyrica (Pregabalin) 14)  Tramadol Hcl (Tramadol Hcl) 15)  Percocet (Oxycodone-Acetaminophen)  Past History:  Past medical, surgical, family and social histories (including risk factors) reviewed for relevance to current acute and chronic problems.  Past Medical History: Reviewed history from 04/10/2009 and no changes required. Chronic fatigue syndrome/fibromyalgia Obstipation-- put on high fiber diet Depression Hyperlipidemia GERD/peptic ulcer Migraine Osteopenia  Past Surgical History: Reviewed history from 04/10/2009 and no changes required. 2005 Appendectomy 2004  Cholecystectomy EGD- Gastric ulcer 1986  TAH-BSO for endometriosis 1990's TMJ 1990's Bunion 2006 Ingrown toenails 06/2005 Hypotension, blood transfusion--bleeding ulcer Pneumonia  3/07  Family History: Reviewed history from 05/28/2007 and no changes required. 5 sisters- HTN, DM, breast cancer (post-menop) 1 died of MI (had DM) father died of heart dz in 3s  mother died of AMI, stroke in 47s CAD-- Mom and Dad HBP:  1 or 2 sisters DM:  1 sister Breast CA:  1 sister Lung Cancer and Prostate CA:  in Aunts and Uncles  Social History: Reviewed history from 04/10/2009 and no changes required. 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  Has one grown daughter in [place].  Smokes 1/2 ppd x 30 yrs.  Does not exercise.   Current Smoker Alcohol use-no Drug use-no  Review of Systems       weight down just a touch from last regular visit had bad sleep last night--only 2 hours. Generally sleeps fairly well, 5-6 hours. Once she awakens though, very hard to go back to sleep  Physical Exam  General:  alert and normal appearance.   Head:  ?slight maxillary tenderness no frontal tenderness Nose:  mild pale congestion Mouth:  no erythema and no exudates.   Neck:  supple, no masses, no thyromegaly, no carotid bruits, and no cervical lymphadenopathy.   Lungs:  normal respiratory effort and normal breath sounds.   Heart:  normal rate, regular rhythm, no murmur, and no gallop.   Abdomen:  soft.  Mild suprapubic tenderness--not clear cut Msk:  vague bilateral CVA tenderness Extremities:  no edema Psych:  normally interactive, good eye contact, not depressed appearing, and slightly anxious.  Impression & Recommendations:  Problem # 1:  ACUTE CYSTITIS (ICD-595.0) Assessment New  mild but persistent symptoms will treat with 3 days of antibiotic  Her updated medication list for this problem includes:    Ciprofloxacin Hcl 250 Mg Tabs (Ciprofloxacin hcl) .Marland Kitchen... 1 tab by mouth two times a day x 3 days for bladder infection  Orders: UA Dipstick W/ Micro (manual) (57846) Prescription Created Electronically [phone])  Problem # 2:   ANXIETY (ICD-300.00) Assessment: Unchanged ongoing but controlled by the xanax  Her updated medication list for this problem includes:    Xanax 1 Mg Tabs (Alprazolam) .Marland Kitchen... Take 1 tablet by mouth four times a day  Problem # 3:  FIBROMYALGIA (ICD-729.1) Assessment: Improved doing better without the regular need for narcotic  The following medications were removed from the medication list:    Fioricet 50-325-40 Mg Tabs (Butalbital-apap-caffeine) .Marland Kitchen... 2 at headache onset Her updated medication list for this problem includes:    Orphenadrine Citrate Cr 100 Mg Xr12h-tab (Orphenadrine citrate) .Marland Kitchen... Take 1 by mouth two times a day    Norco 5-325 Mg Tabs (Hydrocodone-acetaminophen) .Marland Kitchen... Take 1 by mouth three times a day as needed    Hydrocodone-acetaminophen 5-500 Mg Tabs (Hydrocodone-acetaminophen) ..... One pill every 12 hours as needed for pain.    Butalbital-apap-caffeine 50-325-40 Mg Caps (Butalbital-apap-caffeine) .Marland Kitchen... Take 2 tablets at onset of migraine headache  Problem # 4:  RHINITIS, ALLERGIC (ICD-477.9) Assessment: Deteriorated discussed med for this OTC  Complete Medication List: 1)  Neurontin 600 Mg Tabs (Gabapentin) .... 4 by mouth at bedtime 2)  Xanax 1 Mg Tabs (Alprazolam) .... Take 1 tablet by mouth four times a day 3)  Calcium 600 1500 Mg Tabs (Calcium carbonate) .Marland Kitchen.. 1 by mouth daily 4)  Orphenadrine Citrate Cr 100 Mg Xr12h-tab (Orphenadrine citrate) .... Take 1 by mouth two times a day 5)  Norco 5-325 Mg Tabs (Hydrocodone-acetaminophen) .... Take 1 by mouth three times a day as needed 6)  Hydrocodone-acetaminophen 5-500 Mg Tabs (Hydrocodone-acetaminophen) .... One pill every 12 hours as needed for pain. 7)  Ciprofloxacin Hcl 250 Mg Tabs (Ciprofloxacin hcl) .Marland Kitchen.. 1 tab by mouth two times a day x 3 days for bladder infection 8)  Butalbital-apap-caffeine 50-325-40 Mg Caps (Butalbital-apap-caffeine) .... Take 2 tablets at onset of migraine headache  Patient  Instructions: 1)  Please try over the counter loratadine 10mg  1-2 daily for the runny nose 2)  Please schedule a follow-up appointment in 4 months .  Prescriptions: BUTALBITAL-APAP-CAFFEINE 50-325-40 MG CAPS (BUTALBITAL-APAP-CAFFEINE) Take 2 tablets at onset of migraine headache  #30 x 0   Entered and Authorized by:   Cindee Salt MD   Signed by:   Cindee Salt MD on 06/26/2009   Method used:   [facility]        [facility] [phone]* (retail)       [address] [place], [place]  [address]       Ph: [phone] or [phone]       Fax: [phone]   RxID:   [id number] CIPROFLOXACIN HCL 250 MG TABS (CIPROFLOXACIN HCL) 1 tab by mouth two times a day x 3 days for bladder infection  #6 x 1   Entered and Authorized by:   Cindee Salt MD   Signed by:   Cindee Salt MD on 06/26/2009   Method used:   [facility] to        [facility]  [address] [phone]* (retail)       [address] [place], [place]  [address]       Ph: [phone] or [phone]       Fax: [phone]   RxID:   [id number]   Current Allergies (reviewed today): ! IBUPROFEN ! ASA ! VALIUM ! DARVON ! * CYMBALTA ! * RISPERDAL ! CODEINE * SULFA (SULFONAMIDES) GROUP * SKELAXIN (METAXALONE) BUSPAR CIPRO (CIPROFLOXACIN) EFFEXOR LYRICA (PREGABALIN) TRAMADOL HCL (TRAMADOL HCL) PERCOCET (OXYCODONE-ACETAMINOPHEN)  Laboratory Results   Urine Tests  Date/Time Received: June 26, 2009 12:47 PM Date/Time Reported: June 26, 2009 12:47 PM  Routine Urinalysis   Color: yellow Appearance: Clear Glucose: negative   (Normal Range: Negative) Bilirubin: negative   (Normal Range: Negative) Ketone: negative   (Normal Range: Negative) Spec. Gravity: <1.005   (Normal Range: 1.003-1.035) Blood: small   (Normal Range: Negative) pH: 6.0   (Normal Range: 5.0-8.0) Protein: negative   (Normal Range: Negative) Urobilinogen: 0.2   (Normal Range: 0-1) Nitrite: negative   (Normal Range:  Negative) Leukocyte Esterace: large   (Normal Range: Negative)  Urine Microscopic WBC/HPF: 20-30 RBC/HPF: rare Bacteria/HPF: 2+ Epithelial/HPF: occ

## 2010-04-13 NOTE — Progress Notes (Signed)
Summary: rx xanax  Phone Note [facility]ill Request Message [facility] on February 28, 2008 4:31 PM  Refills Requested: Medication #1:  XANAX 1 MG TABS Take 1 tablet by mouth four times a day   Last Refilled: 01/31/2008 form in your inbox   Method Requested: Fax to Local Pharmacy Initial call taken by: Mervin Hack CMA,  February 28, 2008 4:32 PM  Follow-up for Phone Call        okay #120 x 0 Follow-up by: Cindee Salt MD,  February 28, 2008 5:21 PM  Additional Follow-up for Phone Call Additional follow up Details #1::        rx faxed to pharmacy Additional Follow-up by: Mervin Hack CMA,  February 28, 2008 5:28 PM      Prescriptions: [name] 1 MG TABS (ALPRAZOLAM) Take 1 tablet by mouth four times a day  #120 x 1   Entered by:   Mervin Hack CMA   Authorized by:   Cindee Salt MD   Signed by:   Mervin Hack CMA on 02/28/2008   Method used:   Handwritten   RxID:   [id number]

## 2010-04-13 NOTE — Miscellaneous (Signed)
Summary: Mammogram  Clinical Lists Changes  Observations: Added new observation of MAMMOGRAM: normal (10/02/2007 13:54)       Preventive Care Screening  Mammogram:    Date:  10/02/2007    Results:  normal

## 2010-04-13 NOTE — Progress Notes (Signed)
Summary: Darvocet N-100  Phone Note Refill Request Message from:  Fax from Pharmacy on November 27, 2008 3:58 PM  Refills Requested: Medication #1:  DARVOCET-N 100 100-650 MG TABS take 1-2 by mouth once daily as needed [facility] [place]  Phone:   [phone]  Form in your in box   Method Requested: Telephone to Pharmacy Initial call taken by: Delilah Shan CMA Duncan Dull),  November 27, 2008 3:58 PM  Follow-up for Phone Call        okay #60 x 0 Follow-up by: Cindee Salt MD,  November 27, 2008 4:10 PM  Additional Follow-up for Phone Call Additional follow up Details #1::        [facility] Additional Follow-up by: Lowella Petties CMA,  November 27, 2008 4:42 PM    Prescriptions: DARVOCET-N 100 100-650 MG TABS (PROPOXYPHENE N-APAP) take 1-2 by mouth once daily as needed  #60 x 0   Entered by:   Lowella Petties CMA   Authorized by:   Cindee Salt MD   Signed by:   Lowella Petties CMA on 11/27/2008   Method used:   Telephoned to ...       [facility]  [place] [phone]* (retail)       [address] [place], [place]  [address]       Ph: [phone] or [phone]       Fax: [phone]   RxID:   [id number]

## 2010-04-13 NOTE — Progress Notes (Signed)
Summary:  Lindsey Ward  Phone Note Refill Request Message from:  [facility] [phone] on September 24, 2009 2:59 PM  Refills Requested: Medication #1:  XANAX 1 MG TABS Take 1 tablet by mouth four times a day   Last Refilled: 08/25/2009  Medication #2:  NORCO 5-325 MG TABS take 1 by mouth three times a day as needed   Last Refilled: 08/25/2009 Dr.Amish Mintzer's patient   Method Requested: Telephone to Pharmacy Initial call taken by: Mervin Hack CMA Duncan Dull),  September 24, 2009 3:00 PM  Follow-up for Phone Call        Rx called to pharmacy Follow-up by: Mervin Hack CMA Duncan Dull),  September 24, 2009 4:05 PM    Prescriptions: NORCO 5-325 MG TABS (HYDROCODONE-ACETAMINOPHEN) take 1 by mouth three times a day as needed  #90 x 0   Entered and Authorized by:   Ruthe Mannan MD   Signed by:   Ruthe Mannan MD on 09/24/2009   Method used:   Telephoned to ...       [facility]  [place] [phone]* (retail)       [address] [place], [place]  [address]       Ph: [phone] or [phone]       Fax: [phone]   RxID:   [id number] [name] 1 MG TABS (ALPRAZOLAM) Take 1 tablet by mouth four times a day  #120 x 0   Entered and Authorized by:   Ruthe Mannan MD   Signed by:   Ruthe Mannan MD on 09/24/2009   Method used:   Telephoned to ...       [facility]  [place] [phone]* (retail)       [address] [place], [place]  [address]       Ph: [phone] or [phone]       Fax: [phone]   RxID:   [id number]

## 2010-04-13 NOTE — Progress Notes (Signed)
Summary: darvocet refill request  Phone Note Refill Request Message from:  Fax from Pharmacy  Refills Requested: Medication #1:  DARVOCET A500 100-500 MG  TABS take 1 by mouth qid as needed pain   Last Refilled: 11/26/2007 faxed request is for 100/650, # 60, chart has 100/500, please advise.  Initial call taken by: Lowella Petties,  December 27, 2007 11:56 AM  Follow-up for Phone Call        okay #60 x 0 change to the 650 --this is the common dose and what I thought she was on Follow-up by: Cindee Salt MD,  December 27, 2007 1:05 PM  Additional Follow-up for Phone Call Additional follow up Details #1::        Rx faxed to pharmacy. Additional Follow-up by: Sydell Axon,  December 27, 2007 1:43 PM    New/Updated Medications: DARVOCET-N 100 100-650 MG TABS (PROPOXYPHENE N-APAP) Take 1 tablet 4 times a day as needed for severe pain   Prescriptions: DARVOCET-N 100 100-650 MG TABS (PROPOXYPHENE N-APAP) Take 1 tablet 4 times a day as needed for severe pain  #60 x 0   Entered by:   Sydell Axon   Authorized by:   Cindee Salt MD   Signed by:   Sydell Axon on 12/27/2007   Method used:   Handwritten   RxID:   [id number]

## 2010-04-13 NOTE — Assessment & Plan Note (Signed)
Summary: 3 m f/u  dlo   Vital Signs:  Patient Profile:   60 Years Old Female Weight:      113.6 pounds Temp:     97.3 degrees F oral Pulse rate:   68 / minute Pulse rhythm:   regular BP sitting:   110 / 70  (left arm) Cuff size:   regular  Vitals Entered By: Providence Crosby (January 11, 2008 3:38 PM)                 Chief Complaint:  3 month followup.  History of Present Illness: "I've been down for 2 weeks" Gets cramps in legs--needs to use heating pad on back, shoulders, etc Darvocet really not helping Gets constipated if he takes too much of this  has tried walking but "I just couldn't makie it" Can't even stand at stove to cook a meal  Hasn't tried alternative medicines did use chiropractor years ago for persistent headaches--not sure that it ever helped    Prior Medications Reviewed Using: Patient Recall  Current Allergies (reviewed today): ! IBUPROFEN ! ASA ! VALIUM ! DARVON ! * CYMBALTA ! * RISPERDAL * SULFA (SULFONAMIDES) GROUP * SKELAXIN (METAXALONE) BUSPAR (BUSPIRONE HCL) CIPRO (CIPROFLOXACIN) EFFEXOR (VENLAFAXINE HCL) PERCOCET (OXYCODONE-ACETAMINOPHEN) LYRICA (PREGABALIN) TRAMADOL HCL (TRAMADOL HCL)  Past Medical History:    Reviewed history from 02/19/2007 and no changes required:       Chronic fatigue syndrome/fibromyalgia       Vitamin  B12 deficiency,       Obstipation-- put on high fiber diet       Depression       Hyperlipidemia       GERD/peptic ulcer       Migraine                Past Surgical History:    Reviewed history from 02/19/2007 and no changes required:       2005 Appendectomy       2004  Cholecystectomy       EGD- gastric ulcer       1986  tah- bso for endometriosis       1990's TMJ       1990's Bunion       2006 Ingrown toenails       06/2005 Hypotension, blood transfusion--bleeding ulcer       Pneumonia 3/07       Accidental overdose  2007   Social History:    Reviewed history from 02/19/2007 and no changes  required:       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  Has one grown daughter in [place].         Smokes 1/2 ppd x 30 yrs.         Does not exercise.         Not in a relationship currently.         Owns a gun.       Current Smoker       Alcohol use-no       Drug use-no    Review of Systems  The patient denies chest pain, syncope, dyspnea on exertion, and abdominal pain.         sleeping "terrible"    Physical Exam  General:     alert and normal appearance.   Neck:     supple, no masses, and no thyromegaly.   Lungs:     normal respiratory effort and normal breath  sounds.   Heart:     normal rate, regular rhythm, no murmur, and no gallop.   Abdomen:     soft and non-tender.   Msk:     sporadic tender points Extremities:     no edema Psych:     normally interactive, good eye contact, and dysphoric affect.      Impression & Recommendations:  Problem # 1:  FIBROMYALGIA (ICD-729.1) Assessment: Unchanged discussed trying to increase slowly her exercise (ie-walk a little more daily)  Her updated medication list for this problem includes:    Fioricet 50-325-40 Mg Tabs (Butalbital-apap-caffeine) .Marland Kitchen... 2 at headache onset    Darvocet-n 100 100-650 Mg Tabs (Propoxyphene n-apap) .Marland Kitchen... Take 1 tablet 4 times a day as needed for severe pain   Problem # 2:  DEPRESSION (ICD-311) Assessment: Unchanged no changes Her updated medication list for this problem includes:    Xanax 1 Mg Tabs (Alprazolam) .Marland Kitchen... Take 1 tablet by mouth four times a day   Complete Medication List: 1)  Glycolax Powd (Polyethylene glycol 3350) .... As needed 2)  Neurontin 600 Mg Tabs (Gabapentin) .... 4 by mouth at bedtime 3)  Xanax 1 Mg Tabs (Alprazolam) .... Take 1 tablet by mouth four times a day 4)  Calcium 600 1500 Mg Tabs (Calcium carbonate) .Marland Kitchen.. 1 by mouth daily 5)  Omeprazole 20 Mg Tbec (Omeprazole) .... 2 daily 6)  Fioricet 50-325-40 Mg Tabs  (Butalbital-apap-caffeine) .... 2 at headache onset 7)  Darvocet-n 100 100-650 Mg Tabs (Propoxyphene n-apap) .... Take 1 tablet 4 times a day as needed for severe pain 8)  Eq Vegetable Laxative 8.6 Mg Tabs (Sennosides) .... 2 tablets at bedtime   Patient Instructions: 1)  Please schedule a follow-up appointment in 3 months.   ]   Appended Document: 3 m f/u  dlo        Current Allergies: ! IBUPROFEN ! ASA ! VALIUM ! DARVON ! * CYMBALTA ! * RISPERDAL * SULFA (SULFONAMIDES) GROUP * SKELAXIN (METAXALONE) BUSPAR (BUSPIRONE HCL) CIPRO (CIPROFLOXACIN) EFFEXOR (VENLAFAXINE HCL) PERCOCET (OXYCODONE-ACETAMINOPHEN) LYRICA (PREGABALIN) TRAMADOL HCL (TRAMADOL HCL)        Complete Medication List: 1)  Glycolax Powd (Polyethylene glycol 3350) .... As needed 2)  Neurontin 600 Mg Tabs (Gabapentin) .... 4 by mouth at bedtime 3)  Xanax 1 Mg Tabs (Alprazolam) .... Take 1 tablet by mouth four times a day 4)  Calcium 600 1500 Mg Tabs (Calcium carbonate) .Marland Kitchen.. 1 by mouth daily 5)  Omeprazole 20 Mg Tbec (Omeprazole) .... 2 daily 6)  Fioricet 50-325-40 Mg Tabs (Butalbital-apap-caffeine) .... 2 at headache onset 7)  Darvocet-n 100 100-650 Mg Tabs (Propoxyphene n-apap) .... Take 1 tablet 4 times a day as needed for severe pain 8)  Eq Vegetable Laxative 8.6 Mg Tabs (Sennosides) .... 2 tablets at bedtime    ]  Influenza Vaccine    Vaccine Type: Fluvax 3+    Site: right deltoid    Mfr: GlaxoSmithKline    Dose: 0.5 ml    Route: IM    Given by: Providence Crosby    Exp. Date: 09/10/2008    Lot #: [id number]    VIS given: 10/05/06 version given January 11, 2008.  Flu Vaccine Consent Questions    Do you have a history of severe allergic reactions to this vaccine? no    Any prior history of allergic reactions to egg and/or gelatin? no    Do you have a sensitivity to the preservative Thimersol? no    Do you  have a past history of Guillan-Barre Syndrome? no    Do you currently have an acute  febrile illness? no    Have you ever had a severe reaction to latex? no    Vaccine information given and explained to patient? yes    Are you currently pregnant? no

## 2010-04-13 NOTE — Progress Notes (Signed)
Summary: rx propoxyphen  Phone Note [facility] Request [facility] on February 27, 2008 10:46 AM  Refills Requested: Medication #1:  DARVOCET-N 100 100-650 MG TABS Take 1 tablet 4 times a day as needed for severe pain   Last Refilled: 01/29/2008 form in your inbox   Method Requested: Fax to Local Pharmacy Initial call taken by: Mervin Hack CMA,  February 27, 2008 10:46 AM  Follow-up for Phone Call        okay #60 x 0 Follow-up by: Cindee Salt MD,  February 27, 2008 1:49 PM  Additional Follow-up for Phone Call Additional follow up Details #1::        rx faxed to pharmacy Additional Follow-up by: Mervin Hack CMA,  February 27, 2008 2:16 PM      Prescriptions: DARVOCET-N 100 100-650 MG TABS (PROPOXYPHENE N-APAP) Take 1 tablet 4 times a day as needed for severe pain  #60 x 0   Entered by:   Mervin Hack CMA   Authorized by:   Cindee Salt MD   Signed by:   Mervin Hack CMA on 02/27/2008   Method used:   Handwritten   RxID:   [id number]

## 2010-04-13 NOTE — Assessment & Plan Note (Signed)
Summary: Numbness in hand   Vital Signs:  Patient profile:   60 year old female Height:      63 inches Weight:      115 pounds BMI:     20.44 Temp:     98.3 degrees F oral Pulse rate:   78 / minute Pulse rhythm:   regular BP sitting:   108 / 60  (left arm) Cuff size:   regular  Vitals Entered By: Mervin Hack CMA (June 19, 2008 4:02 PM)  History of Present Illness: CC: numbness in hand  fingers on right hand "split"--had things in her hands 2nd and 3rd fingers pushed apart and she noted a knot there (right between the fingers) Occured 1 week ago Notes numbness in a "V" along both fingers but not on the outside portions  No weakness The knot has gotten smaller  Allergies: 1)  ! Ibuprofen 2)  ! Asa 3)  ! Valium 4)  ! Darvon 5)  ! * Cymbalta 6)  ! * Risperdal 7)  * Sulfa (Sulfonamides) Group 8)  * Skelaxin (Metaxalone) 9)  Buspar (Buspirone Hcl) 10)  Cipro (Ciprofloxacin) 11)  Effexor 12)  Percocet (Oxycodone-Acetaminophen) 13)  Lyrica (Pregabalin) 14)  Tramadol Hcl (Tramadol Hcl)  Past History:  Past medical, surgical, family and social histories (including risk factors) reviewed for relevance to current acute and chronic problems.  Past Medical History:    Reviewed history from 02/19/2007 and no changes required:    Chronic fatigue syndrome/fibromyalgia    Vitamin  B12 deficiency,    Obstipation-- put on high fiber diet    Depression    Hyperlipidemia    GERD/peptic ulcer    Migraine  Past Surgical History:    Reviewed history from 02/19/2007 and no changes required:    2005 Appendectomy    2004  Cholecystectomy    EGD- gastric ulcer    1986  tah- bso for endometriosis    1990's TMJ    1990's Bunion    2006 Ingrown toenails    06/2005 Hypotension, blood transfusion--bleeding ulcer    Pneumonia 3/07    Accidental overdose  2007  Family History:    Reviewed history from 05/28/2007 and no changes required:       5 sisters- HTN, DM, breast cancer  (post-menop)       1 died of MI (had DM)       father died of heart dz in 26s        mother died of AMI, stroke in 49s       CAD-- Mom and Dad       HBP:  1 or 2 sisters       DM:  1 sister       Breast CA:  1 sister       Lung Cancer and Prostate CA:  in Aunts and Uncles  Social History:    Reviewed history from 02/19/2007 and no changes required:       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  Has one grown daughter in [place].         Smokes 1/2 ppd x 30 yrs.         Does not exercise.         Not in a relationship currently.         Owns a gun.       Current Smoker       Alcohol use-no  Drug use-no  Review of Systems       fibromyalgia "is blown to its full course" Has had to stay in bed more  Physical Exam  General:  alert.  NAD Msk:  no swelling in hands joints are not inflamed Neurologic:  alert & oriented X3.   decreased sensation along sides of right 2nd and 3rd fingers that face each other. Other sides normal No weakness   Impression & Recommendations:  Problem # 1:  NUMBNESS (ICD-782.0) Assessment New apparently did some damage to digital nerves when fingers split reassured her this is a local thing and should heal and come back to normal over time  Complete Medication List: 1)  Neurontin 600 Mg Tabs (Gabapentin) .... 4 by mouth at bedtime 2)  Xanax 1 Mg Tabs (Alprazolam) .... Take 1 tablet by mouth four times a day 3)  Calcium 600 1500 Mg Tabs (Calcium carbonate) .Marland Kitchen.. 1 by mouth daily 4)  Omeprazole 20 Mg Tbec (Omeprazole) .... 2 daily 5)  Fioricet 50-325-40 Mg Tabs (Butalbital-apap-caffeine) .... 2 at headache onset  Patient Instructions: 1)  Please schedule a follow-up appointment in 2 months.  2)  Cancel April 27th visit      Current Allergies (reviewed today): ! IBUPROFEN ! ASA ! VALIUM ! DARVON ! * CYMBALTA ! * RISPERDAL * SULFA (SULFONAMIDES) GROUP * SKELAXIN (METAXALONE) BUSPAR (BUSPIRONE  HCL) CIPRO (CIPROFLOXACIN) EFFEXOR PERCOCET (OXYCODONE-ACETAMINOPHEN) LYRICA (PREGABALIN) TRAMADOL HCL (TRAMADOL HCL)

## 2010-04-13 NOTE — Assessment & Plan Note (Signed)
Summary: L SHOULDER PAIN/KH   Vital Signs:  Patient Profile:   60 Years Old Female CC:      left shoulder pain Height:     63 inches Weight:      119 pounds O2 Sat:      100 % O2 treatment:    Room Air Temp:     96.9 degrees F oral Pulse rate:   69 / minute Resp:     22 per minute BP sitting:   98 / 58  (right arm)  Pt. in pain?   yes    Location:   left shoulder    Intensity:   10    Type:       sharp  Vitals Entered By: Lita Mains                   Updated Prior Medication List: NEURONTIN 600 MG TABS (GABAPENTIN) 4 by mouth at bedtime XANAX 1 MG TABS (ALPRAZOLAM) Take 1 tablet by mouth four times a day CALCIUM 600 1500 MG TABS (CALCIUM CARBONATE) 1 by mouth daily OMEPRAZOLE 20 MG  TBEC (OMEPRAZOLE) 2 daily FIORICET 50-325-40 MG  TABS (BUTALBITAL-APAP-CAFFEINE) 2 at headache onset ORPHENADRINE CITRATE CR 100 MG XR12H-TAB (ORPHENADRINE CITRATE) take 1 by mouth two times a day NORCO 5-325 MG TABS (HYDROCODONE-ACETAMINOPHEN) take 1 by mouth three times a day as needed  Current Allergies (reviewed today): ! IBUPROFEN ! ASA ! VALIUM ! DARVON ! * CYMBALTA ! * RISPERDAL ! CODEINE * SULFA (SULFONAMIDES) GROUP * SKELAXIN (METAXALONE) BUSPAR (BUSPIRONE HCL) CIPRO (CIPROFLOXACIN) EFFEXOR LYRICA (PREGABALIN) TRAMADOL HCL (TRAMADOL HCL) PERCOCET (OXYCODONE-ACETAMINOPHEN)History of Present Illness History from: patient Chief Complaint: left shoulder pain History of Present Illness: Patient c/o of sharp sudden left shoulder pain that travels down her arm and sometime to her left leg. It has lasted for the past 2 weeks intermitently. It happened once and December of 2010. She has taken hydrocodone for the pain.  Subjective:  Patient has two complaints: 1)  In December she reached backwards with her left hand to pick up a package and felt discomfort in her left shoulder.  The discomfort improved but has now recurred.  She complains of difficulty raising her left arm  overhead for about 2 weeks.  No chest symptoms  2)  She complains of occasional brief sharp pain in her left lower leg medially where she has noticed occasional swelling in a vein.  She has had no trauma to the area.  REVIEW OF SYSTEMS Constitutional Symptoms      Denies fever, chills, night sweats, weight loss, weight gain, and fatigue.  Eyes       Denies change in vision, eye pain, eye discharge, glasses, contact lenses, and eye surgery. Ear/Nose/Throat/Mouth       Denies hearing loss/aids, change in hearing, ear pain, ear discharge, dizziness, frequent runny nose, frequent nose bleeds, sinus problems, sore throat, hoarseness, and tooth pain or bleeding.  Respiratory       Denies dry cough, productive cough, wheezing, shortness of breath, asthma, bronchitis, and emphysema/COPD.  Cardiovascular       Denies murmurs, chest pain, and tires easily with exhertion.    Gastrointestinal       Denies stomach pain, nausea/vomiting, diarrhea, constipation, blood in bowel movements, and indigestion. Genitourniary       Denies painful urination, kidney stones, and loss of urinary control. Neurological       Denies paralysis, seizures, and fainting/blackouts. Musculoskeletal  Complains of muscle pain.      Denies joint pain, joint stiffness, decreased range of motion, redness, swelling, muscle weakness, and gout.      Comments: left shoulder/arm Skin       Denies bruising, unusual mles/lumps or sores, and hair/skin or nail changes.  Psych       Denies mood changes, temper/anger issues, anxiety/stress, speech problems, depression, and sleep problems.  Past History:  Past Medical History: Chronic fatigue syndrome/fibromyalgia Obstipation-- put on high fiber diet Depression Hyperlipidemia GERD/peptic ulcer Migraine Osteopenia  Past Surgical History: 2005 Appendectomy 2004  Cholecystectomy EGD- Gastric ulcer 1986  TAH-BSO for endometriosis 1990's TMJ 1990's Bunion 2006 Ingrown  toenails 06/2005 Hypotension, blood transfusion--bleeding ulcer Pneumonia 3/07  Social History: 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  Has one grown daughter in [place].  Smokes 1/2 ppd x 30 yrs.  Does not exercise.   Current Smoker Alcohol use-no Drug use-no   Objective:  No acute distress  Neck:  Supple.  No adenopathy is present.   Lungs:  Clear to auscultation.  Breath sounds are equal.  Heart:  Regular rate and rhythm without murmurs, rubs, or gallops.  Left shoulder:  good range of motion although she has some hesitation while abducting her arm overhead.  There is tenderness over insertion of biceps tendon.  There is tenderness along medial edge of left scapula Extremities:  No edema.  Pedal pulses are full and equal.  Left lower leg has some vague mild localized tenderness over medial distal tibia. There is also a superficial varicosity at this location.    No erythema or warmth.  Negative Homan's test.                                                X-ray left tibia:  negative                                        Assessment New Problems: SHOULDER PAIN, LEFT (ICD-719.41) LEG PAIN, LEFT (ICD-729.5)  Suspect left shoulder pain a result of biceps tendonitis and rhomboid strain Suspect left lower leg pain a result of varicose veins  Plan New Orders: T-Tibia Left [73590TC] Est. Patient Level IV [[address]] Planning Comments:   Begin shoulder exercises.  ([facility] information and instruction patient handout given)  Recommend obtaing OTC support hose.  Follow-up with PCP if not improving 2 weeks.   The patient and/or caregiver has been counseled thoroughly with regard to medications prescribed including dosage, schedule, interactions, rationale for use, and possible side effects and they verbalize understanding.  Diagnoses and expected course of recovery discussed and will return if not improved as expected or if the condition worsens. Patient  and/or caregiver verbalized understanding.

## 2010-04-13 NOTE — Progress Notes (Signed)
  Phone Note Refill Request Message from:  K[place]land Hun [id number]  Refills Requested: Medication #1:  propoxyphen/apap 100/350  Method Requested: Fax to Local Pharmacy Initial call taken by: Wandra Mannan,  January 01, 2007 4:13 PM  Follow-up for Phone Call        #100 x 0 Follow-up by: Cindee Salt MD,  January 01, 2007 5:31 PM  Additional Follow-up for Phone Call Additional follow up Details #1::        rx faxedto pharm Additional Follow-up by: Wandra Mannan,  January 02, 2007 8:11 AM      Prescriptions: DARVOCET A500 100-500 MG  TABS (PROPOXYPHENE N-APAP) prn  #100 x 0   Entered by:   Wandra Mannan   Authorized by:   Cindee Salt MD   Signed by:   Wandra Mannan on 01/02/2007   Method used:   Handwritten   RxID:   [id number]

## 2010-04-13 NOTE — Progress Notes (Signed)
  [facility] Note [facility]  Refills Requested: Medication #1:  XANAX 1 MG TABS Take 1 tablet by mouth four times a day  Method Requested: Fax to Local Pharmacy Initial call taken by: Wandra Mannan,  May 16, 2007 12:32 PM  Follow-up for Phone Call        #120 x 0 should be as needed  Follow-up by: Cindee Salt MD,  May 16, 2007 1:18 PM  Additional Follow-up for Phone Call Additional follow up Details #1::        rx faxed Additional Follow-up by: Wandra Mannan,  May 16, 2007 1:46 PM      Prescriptions: [name] 1 MG TABS (ALPRAZOLAM) Take 1 tablet by mouth four times a day  #120 x 0   Entered by:   Wandra Mannan   Authorized by:   Cindee Salt MD   Signed by:   Wandra Mannan on 05/16/2007   Method used:   Handwritten   RxID:   [id number]

## 2010-04-13 NOTE — Progress Notes (Signed)
  Phone Note Refill Request Message from:  [phone]  Refills Requested: Medication #1:  FIORICET 50-325-40 MG  TABS 2 at headache onset Initial call taken by: Wandra Mannan,  July 11, 2007 12:14 PM  Follow-up for Phone Call        okay #30 x 0 Follow-up by: Cindee Salt MD,  July 11, 2007 1:44 PM  Additional Follow-up for Phone Call Additional follow up Details #1::        rx called in Additional Follow-up by: Wandra Mannan,  July 11, 2007 2:04 PM      Prescriptions: FIORICET 50-325-40 MG  TABS [facility]) 2 at headache onset  #30 x 0   Entered by:   Wandra Mannan   Authorized by:   Cindee Salt MD   Signed by:   Wandra Mannan on 07/11/2007   Method used:   Handwritten   RxID:   [id number]

## 2010-04-13 NOTE — Progress Notes (Signed)
Summary: rx gabapentin  Phone Note Refill Request Message from:  [employer] on November 26, 2007 4:29 PM  Refills Requested: Medication #1:  NEURONTIN 600 MG TABS 4 by mouth at bedtime   Last Ref[facility]: [facility] [address] [phone]  Initial call taken by: Providence Crosby,  November 26, 2007 4:31 PM  Follow-up for Phone Call        this is the same as neurontin can't be sent electronically, I don't think  okay x 1 year  Follow-up by: Cindee Salt MD,  November 26, 2007 5:38 PM  Additional Follow-up for Phone Call Additional follow up Details #1::        med phoned to pharmacy. Additional Follow-up by: Cooper Render,  November 26, 2007 6:02 PM

## 2010-04-13 NOTE — Assessment & Plan Note (Signed)
Summary: arm pain, per Dr. Karle Starch request/nt   Vital Signs:  Patient profile:   60 year old female Height:      63 inches Weight:      122.8 pounds BMI:     21.83 Temp:     97.7 degrees F oral Pulse rate:   87 / minute Pulse rhythm:   regular BP sitting:   110 / 70  (left arm) Cuff size:   regular  Vitals Entered By: Benny Lennert CMA Duncan Dull) (April 30, 2009 8:16 AM)  History of Present Illness: Chief complaint arm pain per dr. Alphonsus Sias  60 year old female seen at the request of Dr. Alphonsus Sias for arm pain:  About a month ago, felt a pull when feeling for some Kleenex in the seat  Going down from the shoulder to arm. chief complaint is some focal pain laterally, and some anterolaterally on the left shoulder. She denies any neck pain. She denies a shoulder blade pain. She denies any tingling in her hand.  Will keep some heat on it. Will not work. If lays on the bed. None of the medications have helped at all.   she's been on a 9 day prednisone taper. She additionally tried some Flexeril and has been on Vicodin.  Is not done any formal physical therapy, and she has some degree of home exercise program that was given to her by occupational health.  History is significant for fibromyalgia.  No history of any traumatic fracture or injury or operative intervention in the affected hand or shoulder.  Allergies: 1)  ! Ibuprofen 2)  ! Asa 3)  ! Valium 4)  ! Darvon 5)  ! * Cymbalta 6)  ! * Risperdal 7)  ! Codeine 8)  * Sulfa (Sulfonamides) Group 9)  * Skelaxin (Metaxalone) 10)  Buspar (Buspirone Hcl) 11)  Cipro (Ciprofloxacin) 12)  Effexor 13)  Lyrica (Pregabalin) 14)  Tramadol Hcl (Tramadol Hcl) 15)  Percocet (Oxycodone-Acetaminophen)  Past History:  Past medical, surgical, family and social histories (including risk factors) reviewed, and no changes noted (except as noted below).  Past Medical History: Reviewed history from 04/10/2009 and no changes required. Chronic  fatigue syndrome/fibromyalgia Obstipation-- put on high fiber diet Depression Hyperlipidemia GERD/peptic ulcer Migraine Osteopenia  Past Surgical History: Reviewed history from 04/10/2009 and no changes required. 2005 Appendectomy 2004  Cholecystectomy EGD- Gastric ulcer 1986  TAH-BSO for endometriosis 1990's TMJ 1990's Bunion 2006 Ingrown toenails 06/2005 Hypotension, blood transfusion--bleeding ulcer Pneumonia 3/07  Family History: Reviewed history from 05/28/2007 and no changes required. 5 sisters- HTN, DM, breast cancer (post-menop) 1 died of MI (had DM) father died of heart dz in 58s  mother died of AMI, stroke in 48s CAD-- Mom and Dad HBP:  1 or 2 sisters DM:  1 sister Breast CA:  1 sister Lung Cancer and Prostate CA:  in Aunts and Uncles  Social History: Reviewed history from 04/10/2009 and no changes required. 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  Has one grown daughter in [place].  Smokes 1/2 ppd x 30 yrs.  Does not exercise.   Current Smoker Alcohol use-no Drug use-no  Review of Systems       REVIEW OF SYSTEMS  GEN: No systemic complaints, no fevers, chills, sweats, or other acute illnesses MSK: Detailed in the HPI GI: tolerating PO intake without difficulty Neuro: No numbness, parasthesias, or tingling associated. Otherwise the pertinent positives of the ROS are noted above.    Physical  Exam  General:  GEN: Well-developed,well-nourished,in no acute distress; alert,appropriate and cooperative throughout examination HEENT: Normocephalic and atraumatic without obvious abnormalities. No apparent alopecia or balding. Ears, externally no deformities PULM: Breathing comfortably in no respiratory distress EXT: No clubbing, cyanosis, or edema PSYCH: Normally interactive. Cooperative during the interview. Pleasant. Friendly and conversant. Not anxious or depressed appearing. Normal, full affect.  Msk:  right shoulder:  Full range of motion, nontender throughout, all special testing impingement signs are negative.  Left shoulder: Full range of motion, no painful arc of motion. Nontender along the clavicle and along and at the acromioclavicular joint.  Mildly tender the supraspinous insertion and probably tender at the bicipital groove.  Negative sulcus sign. Negative anterior and posterior translation. No crepitus.  Minimally tender near sign. Minimally tender Leanord Asal test.  Negative speed's test and negative Yergason's test.   Impression & Recommendations:  Problem # 1:  SHOULDER PAIN, LEFT (ICD-719.41) this case is complicated by the patient's baseline fibromyalgia, which alters her pain sensations.  Think this is likely a multifactorial case with some mild impingement syndrome, bursitis, and bicipital tendinitis.  We'll send for formal physical therapy for her scapular retraining and rotator cuff strengthening. I would not anticipate immediate relief of symptoms, and discussed with her management, and she will followup in 6 weeks.  I would not anticipate narcotics being of any significant benefit other than for pain control.  cc: Dr. Alphonsus Sias  The following medications were removed from the medication list:    Cyclobenzaprine Hcl 10 Mg Tabs (Cyclobenzaprine hcl) .Marland Kitchen... 1 by mouth 2 times daily as needed for pain Her updated medication list for this problem includes:    Fioricet 50-325-40 Mg Tabs (Butalbital-apap-caffeine) .Marland Kitchen... 2 at headache onset    Orphenadrine Citrate Cr 100 Mg Xr12h-tab (Orphenadrine citrate) .Marland Kitchen... Take 1 by mouth two times a day    Norco 5-325 Mg Tabs (Hydrocodone-acetaminophen) .Marland Kitchen... Take 1 by mouth three times a day as needed    Hydrocodone-acetaminophen 5-500 Mg Tabs (Hydrocodone-acetaminophen) ..... One pill every 12 hours as needed for pain.  Orders: Physical Therapy Referral (PT)  Problem # 2:  SHOULDER IMPINGEMENT SYNDROME, LEFT (ICD-726.2)  Problem # 3:   BICEPS TENDINITIS, LEFT (ICD-726.12)  Complete Medication List: 1)  Neurontin 600 Mg Tabs (Gabapentin) .... 4 by mouth at bedtime 2)  Xanax 1 Mg Tabs (Alprazolam) .... Take 1 tablet by mouth four times a day 3)  Calcium 600 1500 Mg Tabs (Calcium carbonate) .Marland Kitchen.. 1 by mouth daily 4)  Omeprazole 20 Mg Tbec (Omeprazole) .... 2 daily 5)  Fioricet 50-325-40 Mg Tabs (Butalbital-apap-caffeine) .... 2 at headache onset 6)  Orphenadrine Citrate Cr 100 Mg Xr12h-tab (Orphenadrine citrate) .... Take 1 by mouth two times a day 7)  Norco 5-325 Mg Tabs (Hydrocodone-acetaminophen) .... Take 1 by mouth three times a day as needed 8)  Hydrocodone-acetaminophen 5-500 Mg Tabs (Hydrocodone-acetaminophen) .... One pill every 12 hours as needed for pain.  Patient Instructions: 1)  f/u 6 weeks 2)  Referral Appointment Information 3)  Day/Date: 4)  Time: 5)  Place/MD: 6)  Address: 7)  Phone/Fax: 8)  Patient given appointment information. Information/Orders faxed/mailed.   Current Allergies (reviewed today): ! IBUPROFEN ! ASA ! VALIUM ! DARVON ! * CYMBALTA ! * RISPERDAL ! CODEINE * SULFA (SULFONAMIDES) GROUP * SKELAXIN (METAXALONE) BUSPAR (BUSPIRONE HCL) CIPRO (CIPROFLOXACIN) EFFEXOR LYRICA (PREGABALIN) TRAMADOL HCL (TRAMADOL HCL) PERCOCET (OXYCODONE-ACETAMINOPHEN)

## 2010-04-13 NOTE — Letter (Signed)
Summary: Results Follow up Letter  [place] [facility]  [address] [place], [place] [address]   Phone: [phone]  Fax: [phone]    10/03/2008 MRN: [id number]     Lindsey Ward [address] [place], [place]  [address]    Dear [name],  The following are the results of your recent test(s):  Test         Result    Pap Smear:        Normal _____  Not Normal _____ Comments: ______________________________________________________ Cholesterol: LDL(Bad cholesterol):         Your goal is less than:         HDL (Good cholesterol):       Your goal is more than: Comments:  ______________________________________________________ Mammogram:        Normal _x____  Not Normal _____ Comments: Please repeat in 1 year  ___________________________________________________________________ Hemoccult:        Normal _____  Not normal _______ Comments:    _____________________________________________________________________ Other Tests:    We routinely do not discuss normal results over the telephone.  If you desire a copy of the results, or you have any questions about this information we can discuss them at your next office visit.   Sincerely,   Barnetta Chapel MD

## 2010-04-13 NOTE — Assessment & Plan Note (Signed)
Summary: ACUTE/SICK FROM HEAD TO TOES/UTI??/CMT   Vital Signs:  Patient Profile:   60 Years Old Female Weight:      111.13 pounds (50.51 kg) Temp:     97.3 degrees F (36.28 degrees C) oral Pulse rate:   80 / minute Pulse rhythm:   regular BP sitting:   80 / 60  (left arm) Cuff size:   regular  Vitals Entered By: Silas Sacramento (October 08, 2007 5:03 PM)                 Chief Complaint:  Pain from head to toe and ?UTI.  History of Present Illness: Feels sick from head to toe Throat sore, chest is tight. Pain along left preauricular area started about 5-6 days ago no fever chronic bladder problems--tried some azo slight cough--slight mucus when she is like clearing her throat NO SOB No nausea or vomiting some rash along right jaw--little bumps-------------recently had skin cancer treated along left chin  Had to go to urgent care last weekend--afraid she was getting impacted again used multiple miralax and mag citrate doses This did help her clear out  Acetaminophen has helped  Has noted some urinary frequency and terminal dysuria    Current Allergies: ! IBUPROFEN ! ASA ! VALIUM ! DARVON ! * CYMBALTA ! * RISPERDAL * SULFA (SULFONAMIDES) GROUP * SKELAXIN (METAXALONE) BUSPAR (BUSPIRONE HCL) CIPRO (CIPROFLOXACIN) EFFEXOR (VENLAFAXINE HCL) PERCOCET (OXYCODONE-ACETAMINOPHEN) LYRICA (PREGABALIN)  Past Medical History:    Reviewed history from 02/19/2007 and no changes required:       Chronic fatigue syndrome/fibromyalgia       Vitamin  B12 deficiency,       Obstipation-- put on high fiber diet       Depression       Hyperlipidemia       GERD/peptic ulcer       Migraine                Past Surgical History:    Reviewed history from 02/19/2007 and no changes required:       2005 Appendectomy       2004  Cholecystectomy       EGD- gastric ulcer       1986  tah- bso for endometriosis       1990's TMJ       1990's Bunion       2006 Ingrown toenails  06/2005 Hypotension, blood transfusion--bleeding ulcer       Pneumonia 3/07       Accidental overdose  2007   Social History:    Reviewed history from 02/19/2007 and no changes required:       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  Has one grown daughter in [place].         Smokes 1/2 ppd x 30 yrs.         Does not exercise.         Not in a relationship currently.         Owns a gun.       Current Smoker       Alcohol use-no       Drug use-no    Review of Systems      See HPI   Physical Exam  General:     alert.  NAD Ears:     R ear normal and L ear normal.   Nose:     mild congestion Mouth:  very mild pharyngeal injection Neck:     supple and no cervical lymphadenopathy.   Lungs:     normal respiratory effort and normal breath sounds.   Abdomen:     soft and non-tender.      Impression & Recommendations:  Problem # 1:  ACUTE CYSTITIS (ICD-595.0) Assessment: New could be causing her general ill feeling but not clear cut  will Rx for 7 days with ceftin will recheck if doesn't feel better Her updated medication list for this problem includes:    Ceftin 250 Mg Tabs (Cefuroxime axetil) .Marland Kitchen... 1 two times a day  Orders: UA Dipstick W/ Micro (manual) ([address])   Complete Medication List: 1)  Glycolax Powd (Polyethylene glycol 3350) .... As needed 2)  Neurontin 600 Mg Tabs (Gabapentin) .... 4 by mouth at bedtime 3)  Xanax 1 Mg Tabs (Alprazolam) .... Take 1 tablet by mouth four times a day 4)  Calcium 600 1500 Mg Tabs (Calcium carbonate) .Marland Kitchen.. 1 by mouth daily 5)  Omeprazole 20 Mg Tbec (Omeprazole) .... 2 daily 6)  Fioricet 50-325-40 Mg Tabs (Butalbital-apap-caffeine) .... 2 at headache onset 7)  Darvocet A500 100-500 Mg Tabs (Propoxyphene n-apap) .... Take 1 by mouth qid as needed pain 8)  Ceftin 250 Mg Tabs (Cefuroxime axetil) .Marland Kitchen.. 1 two times a day   Patient Instructions: 1)  Please schedule a follow-up appointment in  3 months.   Prescriptions: FIORICET 50-325-40 MG  TABS (BUTALBITAL-APAP-CAFFEINE) 2 at headache onset  #30 x 0   Entered and Authorized by:   Cindee Salt MD   Signed by:   Cindee Salt MD on 10/08/2007   Method used:   Print then Give to Patient   RxID:   [id number] CEFTIN 250 MG  TABS (CEFUROXIME AXETIL) 1 two times a day  #14 x 0   Entered and Authorized by:   Cindee Salt MD   Signed by:   Cindee Salt MD on 10/08/2007   Method used:   Print then Give to Patient   RxID:   [id number]  ] Laboratory Results   Urine Tests  Date/Time Received: 10/08/07 5:05pm Date/Time Reported: 10/08/07 5:05pm  Urine Microscopic WBC/HPF: 2-4 RBC/HPF: 0 Bacteria/HPF: rare Epithelial/HPF: rare    Comments: Patient took AZO

## 2010-04-13 NOTE — Progress Notes (Signed)
Summary: Rx Fioricet  Phone Note Refill Request Call back at [phone] [facility]:  [facility] # [id number] on December 27, 2007 12:23 PM  Refills Requested: Medication #1:  FIORICET 50-325-40 MG  TABS 2 at headache onset   Last Refilled: 10/08/2007 Received e-script from pharmacy.   Method Requested: Telephone to Pharmacy Initial call taken by: Sydell Axon,  December 27, 2007 12:23 PM  Follow-up for Phone Call        okay #30 x 0 Follow-up by: Cindee Salt MD,  December 27, 2007 1:20 PM  Additional Follow-up for Phone Call Additional follow up Details #1::        called to pharmacy Additional Follow-up by: Lowella Petties,  December 27, 2007 4:49 PM

## 2010-04-13 NOTE — Progress Notes (Signed)
Summary: darvocet refill request  Phone Note From Pharmacy   [facility] [address] [place]  [phone] Call For: dr Alphonsus Sias  Summary of Call: faxed refill request for darvocet 100/650  last filled 08/30/06 for # 100 Initial call taken by: Lowella Petties,  August 24, 2007 11:02 AM  Follow-up for Phone Call        px written on EMR for call in  Follow-up by: Judith Part MD,  August 24, 2007 11:13 AM  Additional Follow-up for Phone Call Additional follow up Details #1::        called to pharmacy Additional Follow-up by: Lowella Petties,  August 24, 2007 11:43 AM      Prescriptions: DARVOCET A500 100-500 MG  TABS (PROPOXYPHENE N-APAP) take 1 by mouth qid as needed pain  #60 x 0   Entered and Authorized by:   Judith Part MD   Signed by:   Lowella Petties on 08/24/2007   Method used:   Telephoned to ...         RxID:   [id number]

## 2010-04-13 NOTE — Progress Notes (Signed)
Summary: Fioricet  Phone Note [facility]ill Request [facility] March 10[facility] 2008 12:37 PM  Refills Requested: Medication #1:  FIORICET 50-325-40 MG  TABS 2 at headache onset   Last Refilled: 12/27/2007 [phone]   Method Requested: Electronic Initial call taken by: Mervin Hack CMA,  March 10, 2008 12:38 PM  Follow-up for Phone Call        okay #30x x 0 Follow-up by: Cindee Salt MD,  March 10, 2008 2:29 PM  Additional Follow-up for Phone Call Additional follow up Details #1::        rx e-scribed to pharmacy Additional Follow-up by: Mervin Hack CMA,  March 10, 2008 3:53 PM

## 2010-04-13 NOTE — Progress Notes (Signed)
  Phone Note [facility]  Refills Requested: Medication #1:  DARVOCET A500 100-500 MG  TABS take 1 by mouth qid as needed pain  Method Requested: Fax to Local Pharmacy Initial call taken by: Wandra Mannan,  April 23, 2007 8:57 AM  Follow-up for Phone Call        okay #100 x 0 Follow-up by: Cindee Salt MD,  April 23, 2007 1:40 PM  Additional Follow-up for Phone Call Additional follow up Details #1::        rx faxed Additional Follow-up by: Wandra Mannan,  April 23, 2007 1:52 PM

## 2010-04-13 NOTE — Progress Notes (Signed)
Summary: Rx Darvocet and Xanax  Phone Note Refill Request Call back at [phone] Message [id number] on March 25, 2008 4:13 PM  Refills Requested: Medication #1:  DARVOCET-N 100 100-650 MG TABS Take 1 tablet 4 times a day as needed for severe pain   Last Refilled: 02/27/2008  Medication #2:  XANAX 1 MG TABS Take 1 tablet by mouth four times a day   Last Refilled: 02/28/2008 Faxed forms are in your in box.   Method Requested: Fax to Local Pharmacy Initial call taken by: Sydell Axon,  March 25, 2008 4:15 PM  Follow-up for Phone Call        okay 1 month of each approved Follow-up by: Cindee Salt MD,  March 26, 2008 8:38 AM  Additional Follow-up for Phone Call Additional follow up Details #1::        rx faxed to pharmacy. Additional Follow-up by: Mervin Hack CMA,  March 26, 2008 9:15 AM      Prescriptions: DARVOCET-N 100 100-650 MG TABS (PROPOXYPHENE N-APAP) Take 1 tablet 4 times a day as needed for severe pain  #60 x 1   Entered by:   Mervin Hack CMA   Authorized by:   Cindee Salt MD   Signed by:   Mervin Hack CMA on 03/26/2008   Method used:   Telephoned to ...         RxID:   [id number] [name] 1 MG TABS (ALPRAZOLAM) Take 1 tablet by mouth four times a day  #120 x 1   Entered by:   Mervin Hack CMA   Authorized by:   Cindee Salt MD   Signed by:   Mervin Hack CMA on 03/26/2008   Method used:   Telephoned to ...         RxID:   [id number]

## 2010-04-13 NOTE — Assessment & Plan Note (Signed)
Summary: FOLLOW UP   Vital Signs:  Patient Profile:   59 Years Old Female Weight:      108 pounds Temp:     96.8 degrees F oral Pulse rate:   80 / minute BP sitting:   86 / 54  (left arm)  Vitals Entered By: Wandra Mannan (February 19, 2007 3:43 PM)                 Chief Complaint:  follow up.  History of Present Illness: couldn't take the lyrica--even at very low dose Went up to 25 mg two times a day  Was very fuzzy headed and "didn't know what was going on around me"  Doing "not good" Caught a virus 3 days ago Chills and fever that have finally broken Fever seemed apparent but didn't check  Having trouble with back and back of legs Bad itching on her bottom--she means her vagina Slight discharge No recent intercourse  Urine has been very strrong--enough to gag her Tried shaving pubic hair but that didn't help Urine here was negative  Current Allergies (reviewed today): ! IBUPROFEN ! ASA ! VALIUM ! DARVON ! * CYMBALTA ! * RISPERDAL * SULFA (SULFONAMIDES) GROUP * SKELAXIN (METAXALONE) BUSPAR (BUSPIRONE HCL) CIPRO (CIPROFLOXACIN) EFFEXOR (VENLAFAXINE HCL) PERCOCET (OXYCODONE-ACETAMINOPHEN) LYRICA (PREGABALIN)  Past Medical History:    Chronic fatigue syndrome/fibromyalgia    Vitamin  B12 deficiency,    Obstipation-- put on high fiber diet    Depression    Hyperlipidemia    GERD/peptic ulcer    Migraine          Past Surgical History:    2005 Appendectomy    2004  Cholecystectomy    EGD- gastric ulcer    1986  [place]- bso for endometriosis    1990's TMJ    1990's Bunion    2006 Ingrown toenails    06/2005 Hypotension, blood transfusion--bleeding ulcer    Pneumonia 3/07    Accidental overdose  2007   Social History:    Reviewed history from 08/15/2006 and no changes required:       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  Has one grown daughter in [place].         Smokes 1/2 ppd x 30 yrs.         Does not  exercise.         Not in a relationship currently.         Owns a gun.       Current Smoker       Alcohol use-no       Drug use-no    Review of Systems       Rectum was itching --tried some cortisone cream and that gave her some help Had diarrhea a week ago and then resolved after 1/2 day Burning in stomach from diaphragm to pubus Heating pad has helped the back and leg pain to some degre   Physical Exam  General:     alert.  NAD Abdomen:     soft.  No sig tenderness but sensitive in various places Genitalia:     erythema of labia majora mainly No induration or worrisome areas mucosa pink and moist.   scant secretions Psych:     normally interactive, good eye contact, and dysphoric affect.      Impression & Recommendations:  Problem # 1:  VAGINITIS (ICD-616.10) Assessment: New mostly external discussed topical care  Problem # 2:  FIBROMYALGIA (ICD-729.1) Assessment: Unchanged no satisfactory response No changes now  Her updated medication list for this problem includes:    Fioricet 50-325-40 Mg Tabs (Butalbital-apap-caffeine) .Marland Kitchen... 2 at headache onset    Darvocet A500 100-500 Mg Tabs (Propoxyphene n-apap) .Marland Kitchen... Take 1 by mouth qid as needed pain   Problem # 3:  DEPRESSION (ICD-311) Assessment: Unchanged not making progress  Her updated medication list for this problem includes:    Xanax 1 Mg Tabs (Alprazolam) .Marland Kitchen... Take 1 tablet by mouth four times a day   Problem # 4:  MIGRAINE, UNSPEC., W/O INTRACTABLE MIGRAINE (ICD-346.90) uses fioricet as needed  Her updated medication list for this problem includes:    Fioricet 50-325-40 Mg Tabs (Butalbital-apap-caffeine) .Marland Kitchen... 2 at headache onset    Darvocet A500 100-500 Mg Tabs (Propoxyphene n-apap) .Marland Kitchen... Take 1 by mouth qid as needed pain   Complete Medication List: 1)  Glycolax Powd (Polyethylene glycol 3350) .... As needed 2)  Neurontin 600 Mg Tabs (Gabapentin) .... 4 by mouth at bedtime 3)  Estradiol 2 Mg  Tabs (Estradiol) 4)  Xanax 1 Mg Tabs (Alprazolam) .... Take 1 tablet by mouth four times a day 5)  Calcium 600 1500 Mg Tabs (Calcium carbonate) .Marland Kitchen.. 1 by mouth daily 6)  Omeprazole 20 Mg Tbec (Omeprazole) .... 2 daily 7)  Fioricet 50-325-40 Mg Tabs (Butalbital-apap-caffeine) .... 2 at headache onset 8)  Darvocet A500 100-500 Mg Tabs (Propoxyphene n-apap) .... Take 1 by mouth qid as needed pain 9)  Lyrica 25 Mg Caps (Pregabalin) .Marland Kitchen.. 1 daily for 1 week, then 1 two times a day for 1 week, then 1 three times a day 10)  Fish Oil  .... Daily   Patient Instructions: 1)  Please schedule a follow-up appointment in 3 months. 2)  Try 1% hydrocortisone cream up to three times a day for the itching    Prescriptions: FIORICET 50-325-40 MG  TABS (BUTALBITAL-APAP-CAFFEINE) 2 at headache onset  #30 x 0   Entered and Authorized by:   Cindee Salt MD   Signed by:   Cindee Salt MD on 02/19/2007   Method used:   Print then Give to Patient   RxID:   [id number]  ] Current Allergies (reviewed today): ! IBUPROFEN ! ASA ! VALIUM ! DARVON ! * CYMBALTA ! * RISPERDAL * SULFA (SULFONAMIDES) GROUP * SKELAXIN (METAXALONE) BUSPAR (BUSPIRONE HCL) CIPRO (CIPROFLOXACIN) EFFEXOR (VENLAFAXINE HCL) PERCOCET (OXYCODONE-ACETAMINOPHEN) LYRICA (PREGABALIN) Current Medications (including changes made in today's visit):  GLYCOLAX  POWD (POLYETHYLENE GLYCOL 3350) as needed NEURONTIN 600 MG TABS (GABAPENTIN) 4 by mouth at bedtime ESTRADIOL 2 MG TABS (ESTRADIOL)  XANAX 1 MG TABS (ALPRAZOLAM) Take 1 tablet by mouth four times a day CALCIUM 600 1500 MG TABS (CALCIUM CARBONATE) 1 by mouth daily OMEPRAZOLE 20 MG  TBEC (OMEPRAZOLE) 2 daily FIORICET 50-325-40 MG  TABS (BUTALBITAL-APAP-CAFFEINE) 2 at headache onset DARVOCET A500 100-500 MG  TABS (PROPOXYPHENE N-APAP) take 1 by mouth qid as needed pain LYRICA 25 MG  CAPS (PREGABALIN) 1 daily for 1 week, then 1 two times a day for 1 week, then 1 three  times a day * FISH OIL daily   Laboratory Results   Urine Tests  Date/Time Recieved: 02/19/2007  Routine Urinalysis   Color: straw Appearance: Clear Glucose: negative   (Normal Range: Negative) Bilirubin: negative   (Normal Range: Negative) Ketone: negative   (Normal Range: Negative) Spec. Gravity: <1.005   (Normal Range: 1.003-1.035) Blood: negative   (Normal Range: Negative) pH: 7.0   (  Normal Range: 5.0-8.0) Protein: negative   (Normal Range: Negative) Urobilinogen: 0.2   (Normal Range: 0-1) Nitrite: negative   (Normal Range: Negative) Leukocyte Esterace: negative   (Normal Range: Negative)      Wet [place]/KOH Source: vagina WBC/hpf rare Bacteria/hpf rare Clue cells/hpf none  Negative whiff Yeast/hpf none KOH no yeast Trichomonas/hpf none   Appended Document: Orders Update    Clinical Lists Changes  Orders: Added new Service order of UA Dipstick w/o Micro (10272) - Signed Added new Service order of Wet Prep (53664QI) - Signed

## 2010-04-13 NOTE — Progress Notes (Signed)
Summary: Rx Hydrocodone/APAP  Phone Note Refill Request Call back at [phone] [facility] on April 27, 2009 9:25 AM  Refills Requested: Medication #1:  HYDROCODONE-ACETAMINOPHEN 5-500 MG TABS one pill every 12 hours as needed for pain..   Last Refilled: 03/26/2009 Received faxed refill request, please advise.  Form in your IN box   Method Requested: Telephone to Pharmacy Initial call taken by: Linde Gillis CMA Duncan Dull),  April 27, 2009 9:26 AM  Follow-up for Phone Call        please call pharmacy and confirm she didn't fill the Rx for #15 last week  If she didn't, okay #90 x 0 Follow-up by: Cindee Salt MD,  April 27, 2009 10:58 AM  Additional Follow-up for Phone Call Additional follow up Details #1::        Rx called to pharmacy Additional Follow-up by: Mervin Hack CMA Duncan Dull),  April 27, 2009 12:11 PM    Prescriptions: NORCO 5-325 MG TABS (HYDROCODONE-ACETAMINOPHEN) take 1 by mouth three times a day as needed  #90 x 0   Entered by:   Mervin Hack CMA (AAMA)   Authorized by:   Cindee Salt MD   Signed by:   Mervin Hack CMA (AAMA) on 04/27/2009   Method used:   Telephoned to ...       [facility]  [place] [phone][facility])       [address] [place], [place]  [address]       Ph: [phone] or [phone]       Fax: [phone]   RxID:   [id number]

## 2010-04-13 NOTE — Progress Notes (Signed)
Summary: refill requests for xanax and darvocet  Phone Note Refill Request Message from:  Fax from Pharmacy  Refills Requested: Medication #1:  DARVOCET-N 100 100-650 MG TABS Take 1 tablet 4 times a day as needed for severe pain   Last Refilled: 03/26/2008  Medication #2:  [name] 1 MG TABS Take 1 tablet by mouth four times a day   Last Refilled: 03/26/2008 [facility] [address] [place] are on your desk.  Initial call taken by: Lowella Petties,  April 25, 2008 2:37 PM  Follow-up for Phone Call        okay 1 month of each with no refills Follow-up by: Cindee Salt MD,  April 25, 2008 5:18 PM  Additional Follow-up for Phone Call Additional follow up Details #1::        Rx faxed to pharmacy Additional Follow-up by: Mervin Hack CMA,  April 25, 2008 5:19 PM      Prescriptions: DARVOCET-N 100 100-650 MG TABS (PROPOXYPHENE N-APAP) Take 1 tablet 4 times a day as needed for severe pain  #60 x 0   Entered by:   Mervin Hack CMA   Authorized by:   Cindee Salt MD   Signed by:   Mervin Hack CMA on 04/25/2008   Method used:   Handwritten   RxID:   [id number] XANAX 1 MG TABS (ALPRAZOLAM) Take 1 tablet by mouth four times a day  #120 x 0   Entered by:   Mervin Hack CMA   Authorized by:   Cindee Salt MD   Signed by:   Mervin Hack CMA on 04/25/2008   Method used:   Handwritten   RxID:   [id number]

## 2010-04-13 NOTE — Progress Notes (Signed)
  Phone Note [facility]  Refills Requested: Medication #1:  DARVOCET A500 100-500 MG  TABS take 1 by mouth qid as needed pain  Method Requested: Fax to Local Pharmacy Initial call taken by: Wandra Mannan,  July 11, 2007 12:44 PM  Follow-up for Phone Call        okay #100 x 0 Follow-up by: Cindee Salt MD,  July 11, 2007 1:43 PM  Additional Follow-up for Phone Call Additional follow up Details #1::        rx called in Additional Follow-up by: Wandra Mannan,  July 11, 2007 2:03 PM      Prescriptions: DARVOCET A500 100-500 MG  TABS (PROPOXYPHENE N-APAP) take 1 by mouth qid as needed pain  #100 x 0   Entered by:   Wandra Mannan   Authorized by:   Cindee Salt MD   Signed by:   Wandra Mannan on 07/11/2007   Method used:   Handwritten   RxID:   [id number]

## 2010-04-13 NOTE — Progress Notes (Signed)
Summary: needs phone call back from nurse  Phone Note Call from Patient Call back at [phone]   Caller: Patient Call For: Cindee Salt MD/ [facility] Summary of Call: Patient is at [facility], wants phone call from nurse. [phone]. It is regarding her arm.  Initial call taken by: Melody Comas,  April 27, 2009 1:14 PM  Follow-up for Phone Call        Spoke with patient she states that her left arm is not getting any better.  She is still in pain been going on for about 4-5 weeks, she states she did not fall or having any injury to her arm.  Been to the Urgent Care in [place] several times, she has not had any x-ray of that arm and is wondering if she should.  She is taking Hydrocodone and Flexeril but says that are not helping with the pain at all.  Please advise.  Linde Gillis CMA Duncan Dull)  April 27, 2009 3:21 PM   Please have her set up appt here for further evaluation Best if she can see Dr Patsy Lager since he specializes in this type of thing Follow-up by: Cindee Salt MD,  April 27, 2009 5:14 PM  Additional Follow-up for Phone Call Additional follow up Details #1::        Scheduled patient to see Dr. Patsy Lager on 04/30/2009 at 8:30am Additional Follow-up by: Linde Gillis CMA Duncan Dull),  April 28, 2009 9:07 AM

## 2010-04-13 NOTE — Miscellaneous (Signed)
Summary: MGM results   Clinical Lists Changes  Observations: Added new observation of MAMMO DUE: 04/2011 (10/08/2009 13:44) Added new observation of MAMMOGRAM: normal (10/08/2009 13:44)      Preventive Care Screening  Mammogram:    Date:  10/08/2009    Next Due:  04/2011    Results:  normal

## 2010-04-13 NOTE — Letter (Signed)
Summary: Results Follow up Letter  [facility]  [address] [place], [place] [address]   Phone: [phone]  Fax: [phone]    10/08/2009 MRN: [id number]  Lindsey Ward [address] [place], [place]  [address]  Dear [name],  The following are the results of your recent test(s):  Test         Result    Pap Smear:        Normal _____  Not Normal _____ Comments: ______________________________________________________ Cholesterol: LDL(Bad cholesterol):         Your goal is less than:         HDL (Good cholesterol):       Your goal is more than: Comments:  ______________________________________________________ Mammogram:        Normal  x_____  Not Normal _____ Comments: Repeat in 1-2 years ___________________________________________________________________ Hemoccult:        Normal _____  Not normal _______ Comments:    _____________________________________________________________________ Other Tests:    We routinely do not discuss normal results over the telephone.  If you desire a copy of the results, or you have any questions about this information we can discuss them at your next office visit.   Sincerely,     Tillman Abide, MD

## 2010-04-13 NOTE — Progress Notes (Signed)
Summary: Algernon Huxley FOR DR.LETVAK  Phone Note Refill Request Message from:  Fax from Pharmacy on September 07, 2007 5:06 PM  Refills Requested: Medication #1:  XANAX 1 MG TABS Take 1 tablet by mouth four times a day   Last Refilled: 08/13/2007 #120  OKAYED TO BE REFILLED AFTER 09/10/2007 PER DR SCHALLER// PRESCRIBTION FAXED TO PHARMACY  Initial call taken by: Providence Crosby,  September 07, 2007 5:06 PM      Prescriptions: XANAX 1 MG TABS (ALPRAZOLAM) Take 1 tablet by mouth four times a day  #120 x 0   Entered by:   Providence Crosby   Authorized by:   Shaune Leeks MD   Signed by:   Providence Crosby on 09/07/2007   Method used:   Print then Give to Patient   RxID:   [id number]

## 2010-04-13 NOTE — Letter (Signed)
Summary: [facility]  [facility]   Imported By: Lanelle Bal 09/18/2009 10:57:53  _____________________________________________________________________  External Attachment:    Type:   Image     Comment:   External Document  Appended Document: [facility] doing a little better with cervical disc disease no injection needed now 2 month follow up

## 2010-04-13 NOTE — Assessment & Plan Note (Signed)
Summary: ROA FOR 4 MONTH FOLLOW-UP/JRR   Vital Signs:  Patient profile:   60 year old female Weight:      116 pounds Temp:     98.3 degrees F oral Pulse rate:   80 / minute Pulse rhythm:   regular BP sitting:   120 / 62  (left arm) Cuff size:   regular  Vitals Entered By: Mervin Hack CMA Duncan Dull) (October 22, 2009 2:03 PM) CC: 4 month follow-up   History of Present Illness: DOing fair Good days and bad days--no major changes  Pain continues--can be very bad when it gets "in my butt" Tolerable in general Goes to pool to do water exercises Occ walks in neighborhood  Mood has been okay Occ flares but nothing that persists  Gets some nausea and bloating at night at times Bowels have okay Not lactose sensitive  Migraines occ 1-2 times per month happy with abortive meds  Allergies: 1)  ! Ibuprofen 2)  ! Asa 3)  ! Valium 4)  ! Darvon 5)  ! * Cymbalta 6)  ! * Risperdal 7)  ! Codeine 8)  * Sulfa (Sulfonamides) Group 9)  * Skelaxin (Metaxalone) 10)  Buspar 11)  Cipro (Ciprofloxacin) 12)  Effexor 13)  Lyrica (Pregabalin) 14)  Tramadol Hcl (Tramadol Hcl) 15)  Percocet (Oxycodone-Acetaminophen)  Past History:  Past medical, surgical, family and social histories (including risk factors) reviewed for relevance to current acute and chronic problems.  Past Medical History: Reviewed history from 04/10/2009 and no changes required. Chronic fatigue syndrome/fibromyalgia Obstipation-- put on high fiber diet Depression Hyperlipidemia GERD/peptic ulcer Migraine Osteopenia  Past Surgical History: Reviewed history from 04/10/2009 and no changes required. 2005 Appendectomy 2004  Cholecystectomy EGD- Gastric ulcer 1986  TAH-BSO for endometriosis 1990's TMJ 1990's Bunion 2006 Ingrown toenails 06/2005 Hypotension, blood transfusion--bleeding ulcer Pneumonia 3/07  Family History: Reviewed history from 05/28/2007 and no changes required. 5 sisters- HTN, DM, breast  cancer (post-menop) 1 died of MI (had DM) father died of heart dz in 36s  mother died of AMI, stroke in 105s CAD-- Mom and Dad HBP:  1 or 2 sisters DM:  1 sister Breast CA:  1 sister Lung Cancer and Prostate CA:  in Aunts and Uncles  Social History: Reviewed history from 04/10/2009 and no changes required. 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  Has one grown daughter in [place].  Smokes 1/2 ppd x 30 yrs.  Does not exercise.   Current Smoker Alcohol use-no Drug use-no  Review of Systems  The patient denies chest pain, syncope, and dyspnea on exertion.         chronic sleep problems--gets 4-5 hours a night weight is stable appetite is fair  Physical Exam  General:  alert and normal appearance.   Neck:  supple, no masses, no thyromegaly, no carotid bruits, and no cervical lymphadenopathy.   Lungs:  normal respiratory effort, no intercostal retractions, no accessory muscle use, and normal breath sounds.   Heart:  normal rate, regular rhythm, no murmur, and no gallop.   Abdomen:  soft and non-tender.   Msk:  no joint tenderness and no joint swelling.   Pulses:  1+ in feet Extremities:  No edema Psych:  normally interactive, good eye contact, not anxious appearing, and not depressed appearing.     Impression & Recommendations:  Problem # 1:  FIBROMYALGIA (ICD-729.1) Assessment Unchanged Ongoing pain but stable no changes  The following medications were removed from the medication  list:    Hydrocodone-acetaminophen 5-500 Mg Tabs (Hydrocodone-acetaminophen) ..... One pill every 12 hours as needed for pain. Her updated medication list for this problem includes:    Norco 5-325 Mg Tabs (Hydrocodone-acetaminophen) .Marland Kitchen... Take 1 by mouth three times a day as needed    Butalbital-apap-caffeine 50-325-40 Mg Caps (Butalbital-apap-caffeine) .Marland Kitchen... Take 2 tablets at onset of migraine headache    Orphenadrine Citrate Cr 100 Mg Xr12h-tab (Orphenadrine  citrate) .Marland Kitchen... Take 1 by mouth two times a day  Problem # 2:  ANXIETY (ICD-300.00) Assessment: Unchanged mood is stable no new meds needed  Her updated medication list for this problem includes:    Xanax 1 Mg Tabs (Alprazolam) .Marland Kitchen... Take 1 tablet by mouth four times a day  Problem # 3:  GERD (ICD-530.81) Assessment: Unchanged occ mild stomach trouble willjust observe for now  Problem # 4:  SYMPTOM, DISTURBANCE, SLEEP NOS (ICD-780.50) Assessment: Comment Only generally does fair No serious daytime somnolence  Complete Medication List: 1)  Neurontin 600 Mg Tabs (Gabapentin) .... 4 by mouth at bedtime 2)  Xanax 1 Mg Tabs (Alprazolam) .... Take 1 tablet by mouth four times a day 3)  Norco 5-325 Mg Tabs (Hydrocodone-acetaminophen) .... Take 1 by mouth three times a day as needed 4)  Butalbital-apap-caffeine 50-325-40 Mg Caps (Butalbital-apap-caffeine) .... Take 2 tablets at onset of migraine headache 5)  Calcium 600 1500 Mg Tabs (Calcium carbonate) .Marland Kitchen.. 1 by mouth daily 6)  Orphenadrine Citrate Cr 100 Mg Xr12h-tab (Orphenadrine citrate) .... Take 1 by mouth two times a day  Patient Instructions: 1)  Please schedule a follow-up appointment in 4 months for physical Prescriptions: NORCO 5-325 MG TABS (HYDROCODONE-ACETAMINOPHEN) take 1 by mouth three times a day as needed  #90 x 0   Entered and Authorized by:   Cindee Salt MD   Signed by:   Cindee Salt MD on 10/22/2009   Method used:   Print then Give to Patient   RxID:   [id number] [name] 1 MG TABS (ALPRAZOLAM) Take 1 tablet by mouth four times a day  #120 x 0   Entered and Authorized by:   Cindee Salt MD   Signed by:   Cindee Salt MD on 10/22/2009   Method used:   Print then Give to Patient   RxID:   [id number]   Current Allergies (reviewed today): ! IBUPROFEN ! ASA ! VALIUM ! DARVON ! * CYMBALTA ! * RISPERDAL ! CODEINE * SULFA (SULFONAMIDES) GROUP * SKELAXIN  (METAXALONE) BUSPAR CIPRO (CIPROFLOXACIN) EFFEXOR LYRICA (PREGABALIN) TRAMADOL HCL (TRAMADOL HCL) PERCOCET (OXYCODONE-ACETAMINOPHEN)

## 2010-04-13 NOTE — Assessment & Plan Note (Signed)
Summary: R SHOULDER PAIN/CLE   Vital Signs:  Patient profile:   60 year old female Height:      63 inches Weight:      114.50 pounds BMI:     20.36 Temp:     98.2 degrees F oral Pulse rate:   68 / minute Pulse rhythm:   regular BP sitting:   100 / 68  (left arm) Cuff size:   regular  Vitals Entered By: Lewanda Rife (Jul 17, 2008 3:33 PM)  History of Present Illness: Chief complaint: right shoulder pain  60 year old with right shoulder pain:  Has been in the bed more or less for the last two months, has some fibromyalgia. On the 29th, did have some pain in some of the R posterior trap area.   Went to the [facility] / UC  no history of trauma, fracture, dislocation, operative intervention on the affected shoulder. She does not have any problems with overhead activities, and primarily complains of posterior muscular pain on the right side and these region. She has had some neck pain intermittently and does have chronic fibromyalgia.  Norflex has really helped with her fibromyalgia - previously has been in bed for almost 2 months  No acute shoulder pathology  Fibro  Norflex  Allergies: 1)  ! Ibuprofen 2)  ! Asa 3)  ! Valium 4)  ! Darvon 5)  ! * Cymbalta 6)  ! * Risperdal 7)  * Sulfa (Sulfonamides) Group 8)  * Skelaxin (Metaxalone) 9)  Buspar (Buspirone Hcl) 10)  Cipro (Ciprofloxacin) 11)  Effexor 12)  Percocet (Oxycodone-Acetaminophen) 13)  Lyrica (Pregabalin) 14)  Tramadol Hcl (Tramadol Hcl)  Past History:  Past medical, surgical, family and social histories (including risk factors) reviewed, and no changes noted (except as noted below).  Past Medical History:    Reviewed history from 02/19/2007 and no changes required:    Chronic fatigue syndrome/fibromyalgia    Vitamin  B12 deficiency,    Obstipation-- put on high fiber diet    Depression    Hyperlipidemia    GERD/peptic ulcer    Migraine  Past Surgical History:    Reviewed history from 02/19/2007  and no changes required:    2005 Appendectomy    2004  Cholecystectomy    EGD- gastric ulcer    1986  [place] for endometriosis    1990's TMJ    1990's Bunion    2006 Ingrown toenails    06/2005 Hypotension, blood transfusion--bleeding ulcer    Pneumonia 3/07    Accidental overdose  2007  Family History:    Reviewed history from 05/28/2007 and no changes required:       5 sisters- HTN, DM, breast cancer (post-menop)       1 died of MI (had DM)       father died of heart dz in 7s        mother died of AMI, stroke in 80s       CAD-- Mom and Dad       HBP:  1 or 2 sisters       DM:  1 sister       Breast CA:  1 sister       Lung Cancer and Prostate CA:  in Aunts and Uncles  Social History:    Reviewed history from 02/19/2007 and no changes required:       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  Has  one grown daughter in [place].         Smokes 1/2 ppd x 30 yrs.         Does not exercise.         Not in a relationship currently.         Owns a gun.       Current Smoker       Alcohol use-no       Drug use-no  Review of Systems       REVIEW OF SYSTEMS  GEN: No systemic complaints, no fevers, chills, sweats, or other acute illnesses MSK: Detailed in the HPI GI: tolerating PO intake without difficulty Neuro: No numbness, parasthesias, or tingling associated. Otherwise the pertinent positives of the ROS are noted above. Further ROS may be demarcated in the ROS section of Centricity, If none, then this plus the HPI constitutes the ROS.    Physical Exam  General:  alert.  NADwell-hydrated, appropriate dress, normal appearance, and healthy-appearing.   Head:  Normocephalic and atraumatic without obvious abnormalities. No apparent alopecia or balding. Ears:  no external deformities.   Nose:  no external deformity.   Lungs:  normal respiratory effort.   Msk:  Shoulder: Bilateral Inspection: No muscle wasting or winging MILDLY TENDER AT  POSTERIOR R TRAP Ecchymosis/edema: neg  AC joint, scapula, clavicle: NT Cervical spine: NT, full ROM Spurling's: neg Abduction: full, 5/5 Flexion: full, 5/5 IR, full, lift-off: 5/5 ER at neutral: full, 5/5 AC crossover and compression: neg Neer: neg Hawkins: neg Drop Test: neg Empty Can: neg Supraspinatus insertion: NT Bicipital groove: NT Speed's: neg Yergason's: neg Sulcus sign: neg Scapular dyskinesis: none C5-T1 intact Sensation intact Grip 5/5  Extremities:  No clubbing, cyanosis, edema, or deformity noted with normal full range of motion of all joints.   Neurologic:  alert & oriented X3 and gait normal.     Impression & Recommendations:  Problem # 1:  FIBROMYALGIA (ICD-729.1) Assessment New I do not think this patient has any occult shoulder pathology.  It is reasonable to use a muscle relaxant in fibromyalgia - she has done well on a trial, when many other things have failed.  Her updated medication list for this problem includes:    Fioricet 50-325-40 Mg Tabs (Butalbital-apap-caffeine) .Marland Kitchen... 2 at headache onset    Darvocet-n 100 100-650 Mg Tabs (Propoxyphene n-apap) .Marland Kitchen... Take 1-2 by mouth once daily as needed    Orphenadrine Citrate Cr 100 Mg Xr12h-tab (Orphenadrine citrate) .Marland Kitchen... 1 by mouth two times a day  Problem # 2:  SHOULDER PAIN, RIGHT (ICD-719.41) do not think acute OA, RTC, bicipital tendonitis, ac pathology or other typical shoulder problems.  Her updated medication list for this problem includes:    Fioricet 50-325-40 Mg Tabs (Butalbital-apap-caffeine) .Marland Kitchen... 2 at headache onset    Darvocet-n 100 100-650 Mg Tabs (Propoxyphene n-apap) .Marland Kitchen... Take 1-2 by mouth once daily as needed    Orphenadrine Citrate Cr 100 Mg Xr12h-tab (Orphenadrine citrate) .Marland Kitchen... 1 by mouth two times a day  Complete Medication List: 1)  Neurontin 600 Mg Tabs (Gabapentin) .... 4 by mouth at bedtime 2)  Xanax 1 Mg Tabs (Alprazolam) .... Take 1 tablet by mouth four times a day 3)   Calcium 600 1500 Mg Tabs (Calcium carbonate) .Marland Kitchen.. 1 by mouth daily 4)  Omeprazole 20 Mg Tbec (Omeprazole) .... 2 daily 5)  Fioricet 50-325-40 Mg Tabs (Butalbital-apap-caffeine) .... 2 at headache onset 6)  Darvocet-n 100 100-650 Mg Tabs (Propoxyphene n-apap) .... Take 1-2 by  mouth once daily as needed 7)  Norflex 100 Mg  .Marland Kitchen.. 1 tab by mouth bid 8)  Orphenadrine Citrate Cr 100 Mg Xr12h-tab (Orphenadrine citrate) .Marland Kitchen.. 1 by mouth two times a day  Patient Instructions: 1)  follow-up with Dr. Alphonsus Sias as needed Prescriptions: ORPHENADRINE CITRATE CR 100 MG XR12H-TAB (ORPHENADRINE CITRATE) 1 by mouth two times a day  #60 x 3   Entered and Authorized by:   Hannah Beat MD   Signed by:   Hannah Beat MD on 07/17/2008   Method used:   Print then Give to Patient   RxID:   [id number]     Current Allergies (reviewed today): ! IBUPROFEN ! ASA ! VALIUM ! DARVON ! * CYMBALTA ! * RISPERDAL * SULFA (SULFONAMIDES) GROUP * SKELAXIN (METAXALONE) BUSPAR (BUSPIRONE HCL) CIPRO (CIPROFLOXACIN) EFFEXOR PERCOCET (OXYCODONE-ACETAMINOPHEN) LYRICA (PREGABALIN) TRAMADOL HCL (TRAMADOL HCL)

## 2010-04-13 NOTE — Letter (Signed)
Summary: Generic Letter  [place] [facility]  [address] [place], [place] [address]   Phone: [phone]  Fax: [phone]        10/26/2006  [facility] [address] [place], [place]  [address]  Dear [name],  I have reviewed the recent jury summons that you received.  I agree that you are not an appropriate candidate at this time to serve on a jury. My concerns, as are yours, is mostly a chronic pain condition which would make sitting in a restricted spot for any period of time unreasonable.  My other concern would be the emotional problems you have had with your recent health history.  Your anxiety and depression have affected your mental status and would preclude your proper attention to the importance of being a jury member.  In short I believe that you should be deferred from jury duty at this time.  It is possible that you could have some degree of remission of your pain problems in the future, but I think your deferral should be open-ended at this time. At the very minimum, I don't believe that you should be reconsidered for jury duty for at least the next two to 3 years.    Sincerely,      Tillman Abide MD [place] [facility]

## 2010-04-13 NOTE — Progress Notes (Signed)
Summary: ? about rx       21  22  23  24  25   Phone Note Call from Patient Call back at [facility] Phone [phone]   Caller: Patient Call For: letvak Summary of Call: pt on prozax x 1 month, it hasn't helped with panic/anxiey attacks, should pt refill rx or stop it . Initial call taken by: Liane Comber,  Aug 08, 2006 3:40 PM  Follow-up for Phone Call        please continue the fluoxetine until your follow-up visit.  When taken for panic attacks it often needs a higher dose and will need to discuss that in the office before any changes. Follow-up by: Cindee Salt MD,  Aug 09, 2006 8:06 AM  Additional Follow-up for Phone Call Additional follow up Details #1::        PATIENT ADVISED  Additional Follow-up by: Liane Comber,  Aug 09, 2006 10:06 AM  New Allergies: * SULFA (SULFONAMIDES) GROUP * SKELAXIN (METAXALONE) BUSPAR (BUSPIRONE HCL) CIPRO (CIPROFLOXACIN) EFFEXOR (VENLAFAXINE HCL) PERCOCET (OXYCODONE-ACETAMINOPHEN) New Allergies: * SULFA (SULFONAMIDES) GROUP * SKELAXIN (METAXALONE) BUSPAR (BUSPIRONE HCL) CIPRO (CIPROFLOXACIN) EFFEXOR (VENLAFAXINE HCL) PERCOCET (OXYCODONE-ACETAMINOPHEN)

## 2010-04-13 NOTE — Assessment & Plan Note (Signed)
Summary: PNEUMONIA/DLO   Vital Signs:  Patient Profile:   60 Years Old Female Weight:      113 pounds Temp:     97.6 degrees F oral Pulse rate:   76 / minute Pulse rhythm:   regular BP sitting:   90 / 54  (right arm) Cuff size:   regular  Vitals Entered By: Providence Crosby (September 03, 2007 11:52 AM)                 Chief Complaint:  ? pneumonia per patient // cough chills right chest and back pain.  History of Present Illness: "I think I might have a touch of pneumonia."  Can't lay down on back because hurts in right back, running a fever since last Thursday...didn't take temp until this AM when she was 100.2. Has mild cough, with pain on right side when she coughs. She has been hospitalized for pneumonia in the past, approx 2-3 years ago before gallbladder and colon surgery. Dr Derrell Lolling did surgery.  Has had no headache, no ear pain, mild right sided neck pain, no rhinitis, mild SOB, no ST and mild cough productive green mucous all day long, no N/V. The chest pain is right-sided, as above, and radiates to the right breast. She has taken some [facility] brand pain/fever reducer.    Prior Medications Reviewed Using: Patient Recall  Current Allergies (reviewed today): ! IBUPROFEN ! ASA ! VALIUM ! DARVON ! * CYMBALTA ! * RISPERDAL * SULFA (SULFONAMIDES) GROUP * SKELAXIN (METAXALONE) BUSPAR (BUSPIRONE HCL) CIPRO (CIPROFLOXACIN) EFFEXOR (VENLAFAXINE HCL) PERCOCET (OXYCODONE-ACETAMINOPHEN) LYRICA (PREGABALIN)      Physical Exam  General:     Well-developed,well-nourished,in no acute distress; alert,appropriate and cooperative throughout examination, looks very comfortable, did not cough once while seen. Head:     Normocephalic and atraumatic without obvious abnormalities. No apparent alopecia or balding. Eyes:     Conjunctiva clear bilaterally.  Ears:     External ear exam shows no significant lesions or deformities.  Otoscopic examination reveals clear canals, tympanic  membranes are intact bilaterally without bulging, retraction, inflammation or discharge. Hearing is grossly normal bilaterally. Nose:     External nasal examination shows no deformity or inflammation. Nasal mucosa are pink and moist without lesions or exudates. Mouth:     Oral mucosa and oropharynx without lesions or exudates.  Teeth in good repair. Neck:     No deformities, masses, or tenderness noted. Chest Wall:     No deformities, masses, or tenderness noted. Lungs:     Normal respiratory effort, chest expands symmetrically. Lungs are clear to auscultation, no crackles or wheezes. Min ronchi on left side with first inspiration.    Impression & Recommendations:  Problem # 1:  BRONCHITIS-ACUTE (ICD-466.0) Assessment: New Does not look to be Pneumonia andf looks like viral bronchitis today. See instructions. I do not feel she needs CXR at this point. The following medications were removed from the medication list:    Amoxicillin-pot Clavulanate 875-125 Mg Tabs (Amoxicillin-pot clavulanate) .Marland Kitchen... 1 two times a day  Her updated medication list for this problem includes:    Doxycycline Hyclate 100 Mg Tabs (Doxycycline hyclate) ..... One tab by mouth two times a day if sxs worsen  or not improved by Thurs PM.  The following medications were removed from the medication list:    Amoxicillin-pot Clavulanate 875-125 Mg Tabs (Amoxicillin-pot clavulanate) .Marland Kitchen... 1 two times a day  Her updated medication list for this problem includes:    Doxycycline Hyclate 100  Mg Tabs (Doxycycline hyclate) ..... One tab by mouth bid   Complete Medication List: 1)  Glycolax Powd (Polyethylene glycol 3350) .... As needed 2)  Neurontin 600 Mg Tabs (Gabapentin) .... 4 by mouth at bedtime 3)  Xanax 1 Mg Tabs (Alprazolam) .... Take 1 tablet by mouth four times a day 4)  Calcium 600 1500 Mg Tabs (Calcium carbonate) .Marland Kitchen.. 1 by mouth daily 5)  Omeprazole 20 Mg Tbec (Omeprazole) .... 2 daily 6)  Fioricet 50-325-40  Mg Tabs (Butalbital-apap-caffeine) .... 2 at headache onset 7)  Darvocet A500 100-500 Mg Tabs (Propoxyphene n-apap) .... Take 1 by mouth qid as needed pain 8)  Doxycycline Hyclate 100 Mg Tabs (Doxycycline hyclate) .... One tab by mouth bid   Patient Instructions: 1)  For congestion, use GUAIFENESIN  600mg  by mouth AM and NOON  2)    [facility] MUCOUS RELIEF EXPECTORANT (400 mg) 11/2 TABS    3)  Drink Lots of Fluids.            4)  May continue Acetaminophen. 5)  If no better bt Thurs PM, staryt Doxycyline.           Prescriptions: DOXYCYCLINE HYCLATE 100 MG  TABS (DOXYCYCLINE HYCLATE) one tab by mouth bid  #20 x 0   Entered and Authorized by:   Shaune Leeks MD   Signed by:   Shaune Leeks MD on 09/03/2007   Method used:   Print then Give to Patient   RxID:   [id number]  ]

## 2010-04-13 NOTE — Progress Notes (Signed)
Summary: refill requests for darvocet and xanax  Phone Note Refill Request Message from:  Fax from Pharmacy  Refills Requested: Medication #1:  XANAX 1 MG TABS Take 1 tablet by mouth four times a day   Last Refilled: 07/24/2008  Medication #2:  DARVOCET-N 100 100-650 MG TABS take 1-2 by mouth once daily as needed   Last Refilled: 07/24/2008 [facility]ax[facility] [address] [place] are on your desk.  Initial call taken by: Lowella Petties,  August 27, 2008 11:44 AM  Follow-up for Phone Call        Refill approved-nurse to complete 1 month of each Follow-up by: Cindee Salt MD,  August 27, 2008 1:50 PM  Additional Follow-up for Phone Call Additional follow up Details #1::        Rx faxed to pharmacy Additional Follow-up by: Mervin Hack CMA,  August 27, 2008 2:59 PM      Prescriptions: [name] 1 MG TABS (ALPRAZOLAM) Take 1 tablet by mouth four times a day  #120 x 0   Entered by:   Mervin Hack CMA   Authorized by:   Cindee Salt MD   Signed by:   Mervin Hack CMA on 08/27/2008   Method used:   Handwritten   RxID:   [id number] DARVOCET-N 100 100-650 MG TABS (PROPOXYPHENE N-APAP) take 1-2 by mouth once daily as needed  #60 x 0   Entered by:   Mervin Hack CMA   Authorized by:   Cindee Salt MD   Signed by:   Mervin Hack CMA on 08/27/2008   Method used:   Handwritten   RxID:   [id number]

## 2010-04-13 NOTE — Progress Notes (Signed)
Summary: stopped taking norvasc   Phone Note Call from Patient   Summary of Call: Patient called to let you know that she stopped taking the norvasc for her headaches. She said that she was feeling very drained while taking it. She started taking the  Butalbital again. She just wanted to let you know.  Initial call taken by: Melody Comas,  January 04, 2010 10:43 AM  Follow-up for Phone Call        noted Follow-up by: Cindee Salt MD,  January 04, 2010 1:09 PM

## 2010-04-13 NOTE — Assessment & Plan Note (Signed)
Summary: 2 MONTH FOLLOW UP/[facility]   Vital Signs:  Patient Profile:   60 Years Old Female Weight:      107.13 pounds Temp:     97 degrees F oral Pulse rate:   76 / minute BP sitting:   102 / 68  (right arm)  Vitals Entered By: Wandra Mannan (October 13, 2006 9:58 AM)               Chief Complaint:  2 mo fu  med refill.  History of Present Illness: "Imm not worth a darn" Needs heat pads for "belly and back...it feels like it is in the bones" Did see Dr Nelia Shi lyrica and tramadol--these haven't helped at all and she feels the neurontin did a better job.  Tried to contact him about her problems and had trouble getting through to him Not sure that tramadol  Saw Dr Madilyn Fireman --GI Diagnosed with spasm and gave Rx for levsin and omeprazole Has only used levsin twice--"its okay"  Still with sleep problems--worse since off neurontin Appetite not too great Weight down 1# since last visit  Current Allergies (reviewed today): ! IBUPROFEN ! ASA ! VALIUM ! DARVON ! * CYMBALTA ! * RISPERDAL * SULFA (SULFONAMIDES) GROUP * SKELAXIN (METAXALONE) BUSPAR (BUSPIRONE HCL) CIPRO (CIPROFLOXACIN) EFFEXOR (VENLAFAXINE HCL) PERCOCET (OXYCODONE-ACETAMINOPHEN)  Past Medical History:    chronic fatigue syndrome/fibromyalgia     hosp for bleeding ulcer 4-07     hx of B12 deficiency,    Obstipation-- put on high fiber diet    pneumonia 3-07    hospitalized for accidental drug overdose 2007    Depression    Hyperlipidemia    GERD/peptic ulcer    Migraine    constipation        [facility]    Dr Larna Daughters   Social History:    Reviewed history from 08/15/2006 and no changes required:       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  Has one grown daughter in [place].  Smokes 1/2 ppd x 30 yrs.  Does not exercise.  Not in a relationship currently.  Owns a gun.       Current Smoker       Alcohol use-no       Drug use-no     Physical  Exam  General:     alert.  NAD Neck:     supple, no masses, and no thyromegaly.   Lungs:     normal respiratory effort and normal breath sounds.   Heart:     normal rate, regular rhythm, no murmur, and no gallop.   Abdomen:     mild diffuse tenderness Msk:     moves slowly and gingerly Psych:     dysphoric affect and slightly anxious.      Impression & Recommendations:  Problem # 1:  FIBROMYALGIA (ICD-729.1) Assessment: Unchanged chronic unremitting symptoms Did better on gabapentin  P: stop lyrica     Resume gabapentin  The following medications were removed from the medication list:    Darvocet-n 100 100-650 Mg Tabs (Propoxyphene n-apap) ..... One-two by mouth daily as needed  Her updated medication list for this problem includes:    Tramadol Hcl 50 Mg Tabs (Tramadol hcl) .Marland Kitchen... Take 1 tablet by mouth three times a day prn    Fioricet 50-325-40 Mg Tabs (Butalbital-apap-caffeine) .Marland Kitchen... 2 at headache onset   Problem # 2:  DEPRESSION (ICD-311) chronic but no sig change  Her updated  medication list for this problem includes:    Xanax 1 Mg Tabs (Alprazolam) .Marland Kitchen... Take 1 tablet by mouth four times a day    Fluoxetine Hcl 20 Mg Caps (Fluoxetine hcl) .Marland Kitchen... Take 1 capsule by mouth once a day   Complete Medication List: 1)  Glycolax Powd (Polyethylene glycol 3350) .... As needed 2)  Neurontin 600 Mg Tabs (Gabapentin) .... Three by mouth at bedtime 3)  Estradiol 2 Mg Tabs (Estradiol) 4)  Xanax 1 Mg Tabs (Alprazolam) .... Take 1 tablet by mouth four times a day 5)  Fluoxetine Hcl 20 Mg Caps (Fluoxetine hcl) .... Take 1 capsule by mouth once a day 6)  Calcium 600 1500 Mg Tabs (Calcium carbonate) .Marland Kitchen.. 1 by mouth daily 7)  Promethazine Hcl 25 Mg Tabs (Promethazine hcl) .... As needed 8)  Omeprazole 20 Mg Tbec (Omeprazole) .... 2 daily 9)  Tramadol Hcl 50 Mg Tabs (Tramadol hcl) .... Take 1 tablet by mouth three times a day prn 10)  Hyoscyamine 0.125mg   .... 1 evey 4 to 6 hr  prn 11)  Lyrica 75 Mg Caps (Pregabalin) .... Take 1 tablet by mouth two times a day 12)  Fioricet 50-325-40 Mg Tabs (Butalbital-apap-caffeine) .... 2 at headache onset   Patient Instructions: 1)  Please schedule a follow-up appointment in 3 months.    Prescriptions: FIORICET 50-325-40 MG  TABS (BUTALBITAL-APAP-CAFFEINE) 2 at headache onset  #30 x 0   Entered and Authorized by:   Cindee Salt MD   Signed by:   Cindee Salt MD on 10/13/2006   Method used:   Print then Give to Patient   RxID:   [id number] [name] 1 MG TABS (ALPRAZOLAM) Take 1 tablet by mouth four times a day  #120 x 0   Entered and Authorized by:   Cindee Salt MD   Signed by:   Cindee Salt MD on 10/13/2006   Method used:   Print then Give to Patient   RxID:   [id number]       Current Allergies (reviewed today): ! IBUPROFEN ! ASA ! VALIUM ! DARVON ! * CYMBALTA ! * RISPERDAL * SULFA (SULFONAMIDES) GROUP * SKELAXIN (METAXALONE) BUSPAR (BUSPIRONE HCL) CIPRO (CIPROFLOXACIN) EFFEXOR (VENLAFAXINE HCL) PERCOCET (OXYCODONE-ACETAMINOPHEN) Current Medications (including changes made in today's visit):  GLYCOLAX  POWD (POLYETHYLENE GLYCOL 3350) as needed NEURONTIN 600 MG TABS (GABAPENTIN) three by mouth at bedtime ESTRADIOL 2 MG TABS (ESTRADIOL)  XANAX 1 MG TABS (ALPRAZOLAM) Take 1 tablet by mouth four times a day FLUOXETINE HCL 20 MG CAPS (FLUOXETINE HCL) Take 1 capsule by mouth once a day CALCIUM 600 1500 MG TABS (CALCIUM CARBONATE) 1 by mouth daily PROMETHAZINE HCL 25 MG TABS (PROMETHAZINE HCL) as needed OMEPRAZOLE 20 MG  TBEC (OMEPRAZOLE) 2 daily TRAMADOL HCL 50 MG  TABS (TRAMADOL HCL) Take 1 tablet by mouth three times a day prn * HYOSCYAMINE 0.125MG  1 evey 4 to 6 hr prn LYRICA 75 MG  CAPS (PREGABALIN) Take 1 tablet by mouth two times a day FIORICET 50-325-40 MG  TABS (BUTALBITAL-APAP-CAFFEINE) 2 at headache onset

## 2010-04-13 NOTE — Letter (Signed)
Summary: [facility]/Dr. Madilyn Fireman   Imported By: Eleonore Chiquito 04/14/2008 15:13:00  _____________________________________________________________________  External Attachment:    Type:   Image     Comment:   External Document  Appended Document: [facility]/Dr. Madilyn Fireman EGD with dilatation planned due to worsening dysphagia

## 2010-04-13 NOTE — Progress Notes (Signed)
Summary: Pain  Phone Note Call from Patient Call back at Home Phone [phone]   Caller: Patient Call For: Cindee Salt MD Summary of Call: pt calling complaining of pain in her arm and shoulder, pt seen Dr. Cathren Harsh on friday and he gave her muscle relaxers and pt states they are not helping much unless she also takes Norco.  Pt also states Dr. Cathren Harsh didn't x-ray her arm just her legs because she was also having pain there. I advised pt to call Dr. Cathren Harsh to see what can be done and that I would also let Dr. Alphonsus Sias know to see what suggestions he would have. Please advise. Initial call taken by: Mervin Hack CMA [facility]),  April 13, 2009 9:38 AM  Follow-up for Phone Call        If she has ongoing pain, I would recommend an eval with Dr Patsy Lager to see what he recommends Follow-up by: Cindee Salt MD,  April 13, 2009 1:24 PM  Additional Follow-up for Phone Call Additional follow up Details #1::        pt is being seen now at another clinic, office note states in progress. DeShannon Smith CMA [facility])  April 13, 2009 3:12 PM

## 2010-04-13 NOTE — Assessment & Plan Note (Signed)
Summary: F/U ON PNEUMONIDA/DLO   Vital Signs:  Patient profile:   60 year old female Height:      63 inches Weight:      115.8 pounds BMI:     20.59 Temp:     97.5 degrees F oral Pulse rate:   72 / minute Pulse rhythm:   regular BP sitting:   90 / 60  (left arm) Cuff size:   regular  Vitals Entered By: Benny Lennert CMA (Aug 04, 2008 7:57 AM)  History of Present Illness: Chief complaint follow up pneumonia  60 yo here for f/u R PNA  Was seen at [facility], seen and treated with ABX. High dose amox and has completed med course at this time. Coughing still presents and has some throat irritation, but better.  some rare chills, afebrile at home.  Allergies: 1)  ! Ibuprofen 2)  ! Asa 3)  ! Valium 4)  ! Darvon 5)  ! * Cymbalta 6)  ! * Risperdal 7)  ! Codeine 8)  * Sulfa (Sulfonamides) Group 9)  * Skelaxin (Metaxalone) 10)  Buspar (Buspirone Hcl) 11)  Cipro (Ciprofloxacin) 12)  Effexor 13)  Lyrica (Pregabalin) 14)  Tramadol Hcl (Tramadol Hcl) 15)  Percocet (Oxycodone-Acetaminophen)  Review of Systems General:  Complains of chills; denies fever and sweats. Resp:  Complains of cough; denies chest discomfort, chest pain with inspiration, coughing up blood, excessive snoring, hypersomnolence, morning headaches, pleuritic, shortness of breath, sputum productive, and wheezing.  Physical Exam  General:  alert.  NADwell-hydrated, appropriate dress, normal appearance, and healthy-appearing.   Head:  Normocephalic and atraumatic without obvious abnormalities. No apparent alopecia or balding. Ears:  no external deformities.   Nose:  no external deformity.   Lungs:  Normal respiratory effort, chest expands symmetrically. Lungs are clear to auscultation, no crackles or wheezes. Heart:  Normal rate and regular rhythm. S1 and S2 normal without gallop, murmur, click, rub or other extra sounds. Abdomen:  Bowel sounds positive,abdomen soft and non-tender without masses, organomegaly or  hernias noted. Extremities:  No clubbing, cyanosis, edema, or deformity noted with normal full range of motion of all joints.   Cervical Nodes:  No lymphadenopathy noted Psych:  Cognition and judgment appear intact. Alert and cooperative with normal attention span and concentration. No apparent delusions, illusions, hallucinations   Impression & Recommendations:  Problem # 1:  PNEUMONIA, RIGHT MIDDLE LOBE (ICD-486) resolved. not surprised with occ cough now  Chest X-ray, 2 Views, PA and Lateral Indication: f/u pna Findings: There is no evidence for acute infiltrate.   f/u with Dr. Alphonsus Sias for cpx  The following medications were removed from the medication list:    Amoxicillin 875 Mg Tabs (Amoxicillin) ..... One by mouth two times a day  Orders: CXR- 2view (CXR)  Problem # 2:  SCREENING FOR OSTEOPOROSIS (ICD-V82.81)  Orders: Dexa scan (Dexa scan)  Complete Medication List: 1)  Neurontin 600 Mg Tabs (Gabapentin) .... 4 by mouth at bedtime 2)  Xanax 1 Mg Tabs (Alprazolam) .... Take 1 tablet by mouth four times a day 3)  Calcium 600 1500 Mg Tabs (Calcium carbonate) .Marland Kitchen.. 1 by mouth daily 4)  Omeprazole 20 Mg Tbec (Omeprazole) .... 2 daily 5)  Fioricet 50-325-40 Mg Tabs (Butalbital-apap-caffeine) .... 2 at headache onset 6)  Darvocet-n 100 100-650 Mg Tabs (Propoxyphene n-apap) .... Take 1-2 by mouth once daily as needed 7)  Norflex 100 Mg  .Marland Kitchen.. 1 tab by mouth bid 8)  Orphenadrine Citrate Cr 100 Mg Xr12h-tab (  Orphenadrine citrate) .Marland Kitchen.. 1 by mouth two times a day    Current Allergies (reviewed today): ! IBUPROFEN ! ASA ! VALIUM ! DARVON ! * CYMBALTA ! * RISPERDAL ! CODEINE * SULFA (SULFONAMIDES) GROUP * SKELAXIN (METAXALONE) BUSPAR (BUSPIRONE HCL) CIPRO (CIPROFLOXACIN) EFFEXOR LYRICA (PREGABALIN) TRAMADOL HCL (TRAMADOL HCL) PERCOCET (OXYCODONE-ACETAMINOPHEN)

## 2010-04-13 NOTE — Progress Notes (Signed)
Summary: refill request for xanax  Phone Note Refill Request Message from:  Fax from Pharmacy  Refills Requested: Medication #1:  XANAX 1 MG TABS Take 1 tablet by mouth four times a day   Last Refilled: 12/25/2008 [facility] [address] Kathryne Sharper is on your desk.  Initial call taken by: Lowella Petties CMA,  January 22, 2009 12:34 PM  Follow-up for Phone Call        okay #120 x 0 Follow-up by: Cindee Salt MD,  January 22, 2009 12:38 PM  Additional Follow-up for Phone Call Additional follow up Details #1::        Rx faxed to pharmacy Additional Follow-up by: DeShannon Smith CMA (AAMA),  January 22, 2009 2:30 PM    Prescriptions: XANAX 1 MG TABS (ALPRAZOLAM) Take 1 tablet by mouth four times a day  #120 x 0   Entered by:   Mervin Hack CMA (AAMA)   Authorized by:   Cindee Salt MD   Signed by:   Mervin Hack CMA (AAMA) on 01/22/2009   Method used:   Handwritten   RxID:   [id number]

## 2010-04-13 NOTE — Medication Information (Signed)
Summary: [employer] Prior Auth Approval-Propoxyphene-N w/ APA  [employer] Prior Auth Approval-Propoxyphene-N w/ APAP Tablet   Imported By: Beau Fanny 08/03/2007 14:57:29  _____________________________________________________________________  External Attachment:    Type:   Image     Comment:   External Document

## 2010-04-13 NOTE — Progress Notes (Signed)
Summary:  XANAX/DARVOCET  Phone Note Refill Request Message [facility] [phone] on June 27, 2008 10:31 AM  Refills Requested: Medication #1:  XANAX 1 MG TABS Take 1 tablet by mouth four times a day fax request- also would like refill of Propoxyphen-APAP 100-650 take 1 tablet 4 times a day as needed severe pain, not on active med list.   Method Requested: Telephone to Pharmacy Initial call taken by: Mervin Hack CMA,  June 27, 2008 10:37 AM  Follow-up for Phone Call        okay #120 x 0 of xanax Has used darvocet N-100 in past (on our lists but deleted)  Okay to put back on two times a day as needed     #60 x 0 Follow-up by: Cindee Salt MD,  June 27, 2008 1:54 PM  Additional Follow-up for Phone Call Additional follow up Details #1::        Rx called to pharmacy Additional Follow-up by: Mervin Hack CMA,  June 27, 2008 4:20 PM    New/Updated Medications: DARVOCET-N 100 100-650 MG TABS (PROPOXYPHENE N-APAP) take 1-2 by mouth once daily as needed   Prescriptions: DARVOCET-N 100 100-650 MG TABS (PROPOXYPHENE N-APAP) take 1-2 by mouth once daily as needed  #60 x 0   Entered by:   Mervin Hack CMA   Authorized by:   Cindee Salt MD   Signed by:   Mervin Hack CMA on 06/27/2008   Method used:   Telephoned to ...         RxID:   [id number] [name] 1 MG TABS (ALPRAZOLAM) Take 1 tablet by mouth four times a day  #120 x 0   Entered by:   Mervin Hack CMA   Authorized by:   Cindee Salt MD   Signed by:   Mervin Hack CMA on 06/27/2008   Method used:   Telephoned to ...         RxID:   [id number]

## 2010-04-13 NOTE — Assessment & Plan Note (Signed)
Summary: SHOULDER PAIN/KH   Vital Signs:  Patient Profile:   60 Years Old Female CC:      Rt Neck, shoulder, Rt Breast pain x 3 weeks Height:     63 inches Weight:      115 pounds O2 Sat:      96 % O2 treatment:    Room Air Temp:     97.5 degrees F oral Pulse rate:   79 / minute Pulse rhythm:   regular Resp:     16 per minute BP sitting:   100 / 60  (right arm) Cuff size:   regular  Pt. in pain?   yes    Intensity:   5    Type:       dull  Vitals Entered By: Emilio Math (July 10, 2008 3:32 PM)                   Current Allergies: ! IBUPROFEN ! ASA ! VALIUM ! DARVON ! * CYMBALTA ! * RISPERDAL * SULFA (SULFONAMIDES) GROUP * SKELAXIN (METAXALONE) BUSPAR (BUSPIRONE HCL) CIPRO (CIPROFLOXACIN) EFFEXOR PERCOCET (OXYCODONE-ACETAMINOPHEN) LYRICA (PREGABALIN) TRAMADOL HCL (TRAMADOL HCL) History of Present Illness Chief Complaint: Rt Neck, shoulder, Rt Breast pain x 3 weeks History of Present Illness: MULTIPLE COMPLAINTS OF PAIN. RIGHT SIDE OF NECK, RIGHT SHOULDERL, RIGHT UPPER CHEST, RIGHT BREAST, RIGHT SCAPULAR AREA. NO INJURY. ONSET 3 WKS AGO. PAIN IS SHARPE. NO COUGH OR COLD SYMPTOMS. HX OF FIBROMYALGIA. SAW HER PCP 2 WKS AGO BUT FORGOT TO TELL HIM. NO NEW NUMBNESS OR TINGLING,  Current Meds NEURONTIN 600 MG TABS (GABAPENTIN) 4 by mouth at bedtime XANAX 1 MG TABS (ALPRAZOLAM) Take 1 tablet by mouth four times a day CALCIUM 600 1500 MG TABS (CALCIUM CARBONATE) 1 by mouth daily OMEPRAZOLE 20 MG  TBEC (OMEPRAZOLE) 2 daily FIORICET 50-325-40 MG  TABS (BUTALBITAL-APAP-CAFFEINE) 2 at headache onset DARVOCET-N 100 100-650 MG TABS (PROPOXYPHENE N-APAP) take 1-2 by mouth once daily as needed * NORFLEX 100 MG 1 TAB by mouth BID   REVIEW OF SYSTEMS Constitutional Symptoms      Denies fever, chills, night sweats, weight loss, weight gain, and fatigue.  Eyes       Denies change in vision, eye pain, eye discharge, glasses, contact lenses, and eye  surgery. Ear/Nose/Throat/Mouth       Denies hearing loss/aids, change in hearing, ear pain, ear discharge, dizziness, frequent runny nose, frequent nose bleeds, sinus problems, sore throat, hoarseness, and tooth pain or bleeding.  Respiratory       Denies dry cough, productive cough, wheezing, shortness of breath, asthma, bronchitis, and emphysema/COPD.  Cardiovascular       Denies murmurs, chest pain, and tires easily with exhertion.    Gastrointestinal       Denies stomach pain, nausea/vomiting, diarrhea, constipation, blood in bowel movements, and indigestion. Genitourniary       Denies painful urination, kidney stones, and loss of urinary control. Neurological       Denies paralysis, seizures, and fainting/blackouts. Musculoskeletal       Complains of muscle pain, joint pain, joint stiffness, and decreased range of motion.      Denies redness, swelling, muscle weakness, and gout.  Skin       Denies bruising, unusual mles/lumps or sores, and hair/skin or nail changes.  Psych       Denies mood changes, temper/anger issues, anxiety/stress, speech problems, depression, and sleep problems.  Past History:  Past Medical History:    Reviewed history from  02/19/2007 and no changes required:    Chronic fatigue syndrome/fibromyalgia    Vitamin  B12 deficiency,    Obstipation-- put on high fiber diet    Depression    Hyperlipidemia    GERD/peptic ulcer    Migraine  Past Surgical History:    Reviewed history from 02/19/2007 and no changes required:    2005 Appendectomy    2004  Cholecystectomy    EGD- gastric ulcer    1986  [place]- bso for endometriosis    1990's TMJ    1990's Bunion    2006 Ingrown toenails    06/2005 Hypotension, blood transfusion--bleeding ulcer    Pneumonia 3/07    Accidental overdose  2007   Family History:    Reviewed history from 05/28/2007 and no changes required:       5 sisters- HTN, DM, breast cancer (post-menop)       1 died of MI (had DM)        father died of heart dz in 18s        mother died of AMI, stroke in 53s       CAD-- Mom and Dad       HBP:  1 or 2 sisters       DM:  1 sister       Breast CA:  1 sister       Lung Cancer and Prostate CA:  in Aunts and Uncles  Social History:    Reviewed history from 02/19/2007 and no changes required:       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  Has one grown daughter in [place].         Smokes 1/2 ppd x 30 yrs.         Does not exercise.         Not in a relationship currently.         Owns a gun.       Current Smoker       Alcohol use-no       Drug use-no  Physical Exam General appearance: well developed, well nourished, no acute distress Head: normocephalic, atraumatic Eyes: conjunctivae and lids normal Thyroid: no nodules, masses, tenderness, or enlargement Chest/Lungs: no rales, wheezes, or rhonchi bilateral, breath sounds equal without effort Heart: regular rate and  rhythm, no murmur Abdomen: soft, non-tender without obvious organomegaly Extremities: normal extremities Neurological: grossly intact and non-focal Skin: no obvious rashes or lesions    Assessment New Problems: MYALGIA/MYOSITIS (ICD-729.1)   Plan New Medications/Changes: NORFLEX 100 MG 1 TAB by mouth BID  ##12 x 0, 07/10/2008, Marvis Moeller DO  New Orders: New Patient Level II [99202]     Prescriptions: NORFLEX 100 MG 1 TAB by mouth BID  ##12 x 0   Entered and Authorized by:   Marvis Moeller DO   Signed by:   Marvis Moeller DO on 07/10/2008   Method used:   Print then Give to Patient   RxID:   [id number]    Patient Instructions: 1)  APPLY HEAT THREE TIMES DAILY FOR 15 MIN. FOLLOW UP WITH PCP IF NOT RESILVING. ] ]

## 2010-04-13 NOTE — Assessment & Plan Note (Signed)
Summary: ABNORMAL PAIN IN BREAST   Vital Signs:  Patient Profile:   60 Years Old Female CC:      sharp pain in right breast Height:     63 inches Weight:      112 pounds O2 Sat:      95 % O2 treatment:    Room Air Temp:     97.4 degrees F oral Pulse rate:   71 / minute Pulse rhythm:   regular Resp:     17 per minute BP sitting:   100 / 54  (right arm)  Vitals Entered By: Lannie Fields (Jul 25, 2008 9:47 AM)                  Updated Prior Medication List: NEURONTIN 600 MG TABS (GABAPENTIN) 4 by mouth at bedtime XANAX 1 MG TABS (ALPRAZOLAM) Take 1 tablet by mouth four times a day CALCIUM 600 1500 MG TABS (CALCIUM CARBONATE) 1 by mouth daily OMEPRAZOLE 20 MG  TBEC (OMEPRAZOLE) 2 daily FIORICET 50-325-40 MG  TABS (BUTALBITAL-APAP-CAFFEINE) 2 at headache onset DARVOCET-N 100 100-650 MG TABS (PROPOXYPHENE N-APAP) take 1-2 by mouth once daily as needed * NORFLEX 100 MG 1 TAB by mouth BID ORPHENADRINE CITRATE CR 100 MG XR12H-TAB (ORPHENADRINE CITRATE) 1 by mouth two times a day  Current Allergies (reviewed today): ! IBUPROFEN ! ASA ! VALIUM ! DARVON ! * CYMBALTA ! * RISPERDAL ! CODEINE * SULFA (SULFONAMIDES) GROUP * SKELAXIN (METAXALONE) BUSPAR (BUSPIRONE HCL) CIPRO (CIPROFLOXACIN) EFFEXOR LYRICA (PREGABALIN) TRAMADOL HCL (TRAMADOL HCL) PERCOCET (OXYCODONE-ACETAMINOPHEN) History of Present Illness Chief Complaint: sharp pain in right breast History of Present Illness: Subjective:  Patient complains of intermittent sharp pain in right chest beneath breast for about one month.  The pain had been somewhat pleuritic, worse when supine and initiated by movement.  She has a mild cough, although not new, productive of clear sputum.  No shortness of breath and no fevers, chills, and sweats.  Yesterday while laughing very hard she developed sharp constant pain in the right breast.  No nausea/vomiting.  She continues to smoke. Chart review reveals that she had a normal mammogram  7/09 and normal chest X-ray 9/08.   She also had a normal stress nuclear cardiac study 9/08.  She has a history of GERD.    REVIEW OF SYSTEMS Constitutional Symptoms      Denies fever, chills, night sweats, weight loss, weight gain, and fatigue.  Eyes       Denies change in vision, eye pain, eye discharge, glasses, contact lenses, and eye surgery. Ear/Nose/Throat/Mouth       Denies hearing loss/aids, change in hearing, ear pain, ear discharge, dizziness, frequent runny nose, frequent nose bleeds, sinus problems, sore throat, hoarseness, and tooth pain or bleeding.  Respiratory       Complains of dry cough and wheezing.      Denies productive cough, shortness of breath, asthma, bronchitis, and emphysema/COPD.  Cardiovascular       Complains of chest pain.      Denies murmurs and tires easily with exhertion.    Gastrointestinal       Denies stomach pain, nausea/vomiting, diarrhea, constipation, blood in bowel movements, and indigestion. Genitourniary       Denies painful urination, kidney stones, and loss of urinary control. Neurological       Denies paralysis, seizures, and fainting/blackouts. Musculoskeletal       Denies muscle pain, joint pain, joint stiffness, decreased range of motion, redness, swelling, muscle weakness, and  gout.  Skin       Denies bruising, unusual mles/lumps or sores, and hair/skin or nail changes.  Psych       Denies mood changes, temper/anger issues, anxiety/stress, speech problems, depression, and sleep problems.  Past History:  Past Medical History:    Reviewed history from 02/19/2007 and no changes required:    Chronic fatigue syndrome/fibromyalgia    Vitamin  B12 deficiency,    Obstipation-- put on high fiber diet    Depression    Hyperlipidemia    GERD/peptic ulcer    Migraine  Past Surgical History:    Reviewed history from 02/19/2007 and no changes required:    2005 Appendectomy    2004  Cholecystectomy    EGD- gastric ulcer    [place]  [place] for endometriosis    1990's TMJ    1990's Bunion    2006 Ingrown toenails    06/2005 Hypotension, blood transfusion--bleeding ulcer    Pneumonia 3/07    Accidental overdose  2007   Family History:    Reviewed history from 05/28/2007 and no changes required:       5 sisters- HTN, DM, breast cancer (post-menop)       1 died of MI (had DM)       father died of heart dz in 98s        mother died of AMI, stroke in 42s       CAD-- Mom and Dad       HBP:  1 or 2 sisters       DM:  1 sister       Breast CA:  1 sister       Lung Cancer and Prostate CA:  in Aunts and Uncles  Social History:    Reviewed history from 02/19/2007 and no changes required:       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  Has one grown daughter in [place].        Smokes 1/2 ppd x 30 yrs.        Does not exercise.         Not in a relationship currently.         Owns a gun.       Current Smoker       Alcohol use-no       Drug use-no  Objective:  No acute distress  Pharynx:  normal Neck:  no adenopathy Lungs:  Clear to auscultation.  Breath sounds are equal.  Heart:  Regular rate and rhythm without murmurs, rubs, or gallops.  Chest:  Distinct tenderness along rib directly underneath right breast. Breasts:  Symmetric without erythema, induration. No left breast tenderness.  The right breast is not tender to palpation specifically but there is tenderness over rib underneath.  Nipples are without inversion or discharge.  There is some diffuse nodularity palpated bilaterally but no dominant massess.  There is no supraclavicular or axillary adenopathy palpated.  Breasts are examined supine, sitting. Abdomen:  Nontender without masses or hepatosplenomegaly.  Bowel sounds are present.  No CVA or flank tenderness.  Chest X-ray:  right middle lobe pneumonia    Assessment New Problems: PNEUMONIA, RIGHT MIDDLE LOBE (ICD-486) RIB PAIN, RIGHT SIDED (ICD-786.50) BREAST PAIN, RIGHT  (ICD-611.71)   Plan New Medications/Changes: AMOXICILLIN 875 MG TABS (AMOXICILLIN) One by mouth two times a day  #20 x 0, 07/25/2008, Donna Christen MD  New Orders: T-Chest x-ray, 3 views [71022] New  Patient Level IV [71696] Planning Comments:   Begin amoxicillin 875mg  two times a day for 10 days.  Expecorant daytime and increased fluids. Follow-up with PCP in 10 days for repeat chest X-ray.  Also follow-up for annual mammogram. Recommend heating pad or hot water bottle on right anterior chest at bedtime for her chest discomfort. Return for worsenng symptoms.   The patient and/or caregiver has been counseled thoroughly with regard to medications prescribed including dosage, schedule, interactions, rationale for use, and possible side effects and they verbalize understanding.  Diagnoses and expected course of recovery discussed and will return if not improved as expected or if the condition worsens. Patient and/or caregiver verbalized understanding.    Prescriptions: AMOXICILLIN 875 MG TABS (AMOXICILLIN) One by mouth two times a day  #20 x 0   Entered and Authorized by:   Donna Christen MD   Signed by:   Donna Christen MD on 07/25/2008   Method used:   Print then Give to Patient   RxID:   [id number]    Patient Instructions: 1)  May use Mucinex (guaifenesin)  twice daily for congestion. 2)  Increase fluid intake.  Rest.  3)  Follow-up with family doctor in 10 days for repeat chest X-ray. 4)  Return for worsening symptoms. ] ]

## 2010-04-13 NOTE — Progress Notes (Signed)
Summary: refill requests for xanax and darvocet  Phone Note Refill Request Message from:  Fax from Pharmacy  Refills Requested: Medication #1:  XANAX 1 MG TABS Take 1 tablet by mouth four times a day   Last Refilled: 06/27/2008  Medication #2:  DARVOCET-N 100 100-650 MG TABS take 1-2 by mouth once daily as needed   Last Refilled: 06/27/2008 [facility] [address] [place], forms are on your desk.  Initial call taken by: Lowella Petties,  Jul 24, 2008 1:58 PM  Follow-up for Phone Call        okay 1 month of each with no refill Follow-up by: Cindee Salt MD,  Jul 24, 2008 5:20 PM  Additional Follow-up for Phone Call Additional follow up Details #1::        Rx faxed to pharmacy Additional Follow-up by: Mervin Hack CMA,  Jul 24, 2008 5:26 PM      Prescriptions: DARVOCET-N 100 100-650 MG TABS (PROPOXYPHENE N-APAP) take 1-2 by mouth once daily as needed  #60 x 0   Entered by:   Mervin Hack CMA   Authorized by:   Cindee Salt MD   Signed by:   Mervin Hack CMA on 07/24/2008   Method used:   Handwritten   RxID:   [id number] XANAX 1 MG TABS (ALPRAZOLAM) Take 1 tablet by mouth four times a day  #120 x 0   Entered by:   Mervin Hack CMA   Authorized by:   Cindee Salt MD   Signed by:   Mervin Hack CMA on 07/24/2008   Method used:   Handwritten   RxID:   [id number]

## 2010-04-13 NOTE — Progress Notes (Signed)
Summary: DARVOCET-N  Phone Note [facility]ill Request Message [phone] on January 22, 2009 3:41 PM  Refills Requested: Medication #1:  DARVOCET-N 100 100-650 MG TABS take 1-2 by mouth once daily as needed   Last Refilled: 12/25/2008 Form on your desk    Method Requested: Fax to Local Pharmacy Initial call taken by: DeShannon Smith CMA Duncan Dull),  January 22, 2009 3:42 PM  Follow-up for Phone Call        okay #60 x 0 Follow-up by: Cindee Salt MD,  January 23, 2009 8:05 AM  Additional Follow-up for Phone Call Additional follow up Details #1::        Rx faxed to pharmacy Additional Follow-up by: DeShannon Smith CMA (AAMA),  January 23, 2009 8:45 AM    Prescriptions: DARVOCET-N 100 100-650 MG TABS (PROPOXYPHENE N-APAP) take 1-2 by mouth once daily as needed  #60 x 0   Entered by:   Mervin Hack CMA (AAMA)   Authorized by:   Cindee Salt MD   Signed by:   Mervin Hack CMA (AAMA) on 01/23/2009   Method used:   Handwritten   RxID:   [id number]

## 2010-04-13 NOTE — Progress Notes (Signed)
Summary: Courtesy Call  Phone Note Outgoing Call   Call placed by: Areta Haber CMA,  February 17, 2010 12:33 PM Summary of Call: Courtesy call to pt  - Per pt feeling better. Initial call taken by: Areta Haber CMA,  February 17, 2010 12:34 PM

## 2010-04-13 NOTE — Progress Notes (Signed)
  [facility] Note [facility]  Refills Requested: Medication #1:  XANAX 1 MG TABS Take 1 tablet by mouth four times a day  Method Requested: Fax to Local Pharmacy Initial call taken by: Wandra Mannan,  Jul 13, 2007 9:36 AM  Follow-up for Phone Call        okay #120 x 0 Follow-up by: Cindee Salt MD,  Jul 13, 2007 1:11 PM  Additional Follow-up for Phone Call Additional follow up Details #1::        rx faxed Additional Follow-up by: Wandra Mannan,  Jul 13, 2007 1:42 PM      Prescriptions: [name] 1 MG TABS (ALPRAZOLAM) Take 1 tablet by mouth four times a day  #120 x 0   Entered by:   Wandra Mannan   Authorized by:   Cindee Salt MD   Signed by:   Wandra Mannan on 07/13/2007   Method used:   Handwritten   RxID:   [id number]

## 2010-04-13 NOTE — Miscellaneous (Signed)
Summary: [employer] CONTRACT  [employer]ED [employer] CONTRACT   Imported By: Wilder Glade 10/28/2009 12:44:18  _____________________________________________________________________  External Attachment:    Type:   Image     Comment:   External Document

## 2010-04-13 NOTE — Progress Notes (Signed)
  Phone Note [facility]  Refills Requested: Medication #1:  XANAX 1 MG TABS Take 1 tablet by mouth four times a day  Method Requested: Fax to Local Pharmacy Initial call taken by: Wandra Mannan,  June 15, 2007 1:37 PM  Follow-up for Phone Call        okay #120 x 0 Follow-up by: Cindee Salt MD,  June 15, 2007 1:52 PM  Additional Follow-up for Phone Call Additional follow up Details #1::        rx faxed Additional Follow-up by: Wandra Mannan,  June 15, 2007 2:20 PM      Prescriptions: [name] 1 MG TABS (ALPRAZOLAM) Take 1 tablet by mouth four times a day  #120 x 0   Entered by:   Wandra Mannan   Authorized by:   Cindee Salt MD   Signed by:   Wandra Mannan on 06/15/2007   Method used:   Handwritten   RxID:   [id number]

## 2010-04-13 NOTE — Progress Notes (Signed)
SummaryPrudy Ward  Phone Note Refill Request Message from:  [phone] on February 25, 2009 11:30 AM  Refills Requested: Medication #1:  XANAX 1 MG TABS Take 1 tablet by mouth four times a day   Last Refilled: 01/22/2009 Form on your desk    Method Requested: Fax to Local Pharmacy Initial call taken by: DeShannon Smith CMA Duncan Dull),  February 25, 2009 11:30 AM  Follow-up for Phone Call        okay #120 x 0 Follow-up by: Cindee Salt MD,  February 25, 2009 1:56 PM  Additional Follow-up for Phone Call Additional follow up Details #1::        Rx faxed to pharmacy Additional Follow-up by: DeShannon Smith CMA Duncan Dull),  February 25, 2009 3:03 PM    Prescriptions: XANAX 1 MG TABS (ALPRAZOLAM) Take 1 tablet by mouth four times a day  #120 x 0   Entered by:   Mervin Hack CMA (AAMA)   Authorized by:   Cindee Salt MD   Signed by:   Mervin Hack CMA (AAMA) on 02/25/2009   Method used:   Handwritten   RxID:   [id number]

## 2010-04-13 NOTE — Letter (Signed)
Summary: Results Follow up Letter  [place] [facility]  [address] [place], [place] [address]   Phone: [phone]  Fax: [phone]    10/02/2006 MRN: [id number]   BERDENE ASKARI [address] [place], [place]  [address]  Dear Ms. Nardozzi,  The following are the results of your recent test(s):  Test         Result    Pap Smear:        Normal _____  Not Normal _____ Comments: ______________________________________________________ Cholesterol: LDL(Bad cholesterol):         Your goal is less than:         HDL (Good cholesterol):       Your goal is more than: Comments:  ______________________________________________________ Mammogram:        Normal _x____  Not Normal _____ Comments:  ___________________________________________________________________ Hemoccult:        Normal _____  Not normal _______ Comments:    _____________________________________________________________________ Other Tests:    We routinely do not discuss normal results over the telephone.  If you desire a copy of the results, or you have any questions about this information we can discuss them at your next office visit.   Sincerely,  Coralee Rud.D.

## 2010-04-13 NOTE — Progress Notes (Signed)
  Phone Note Refill Request Message from:  [facility]  [phone]  Refills Requested: Medication #1:  XANAX 1 MG TABS Take 1 tablet by mouth four times a day  Method Requested: Fax to Local Pharmacy Initial call taken by: Wandra Mannan,  February 12, 2007 4:23 PM  Follow-up for Phone Call        #120 x 0 Follow-up by: Cindee Salt MD,  February 12, 2007 5:09 PM  Additional Follow-up for Phone Call Additional follow up Details #1::        rx faxed Additional Follow-up by: Wandra Mannan,  February 13, 2007 7:48 AM

## 2010-04-13 NOTE — Progress Notes (Signed)
Summary: refill requests for norco, xanax  Phone Note Refill Request Message from:  Fax from Pharmacy  Refills Requested: Medication #1:  NORCO 5-325 MG TABS take 1 by mouth three times a day as needed   Last Refilled: 12/24/2009  Medication #2:  XANAX 1 MG TABS Take 1 tablet by mouth four times a day   Last Refilled: 12/24/2009 [facility] [address] [place] are on your desk.  Initial call taken by: Lowella Petties CMA, AAMA,  January 26, 2010 9:02 AM  Follow-up for Phone Call        okay for 1 month of each with no refills Follow-up by: Cindee Salt MD,  January 26, 2010 1:41 PM  Additional Follow-up for Phone Call Additional follow up Details #1::        Rx faxed to pharmacy Additional Follow-up by: DeShannon Smith CMA Duncan Dull),  January 26, 2010 3:26 PM    Prescriptions: NORCO 5-325 MG TABS (HYDROCODONE-ACETAMINOPHEN) take 1 by mouth three times a day as needed  #90 x 0   Entered by:   Mervin Hack CMA (AAMA)   Authorized by:   Cindee Salt MD   Signed by:   Mervin Hack CMA (AAMA) on 01/26/2010   Method used:   Handwritten   RxID:   [id number] XANAX 1 MG TABS (ALPRAZOLAM) Take 1 tablet by mouth four times a day  #120 x 0   Entered by:   Mervin Hack CMA (AAMA)   Authorized by:   Cindee Salt MD   Signed by:   Mervin Hack CMA (AAMA) on 01/26/2010   Method used:   Handwritten   RxID:   [id number]

## 2010-04-13 NOTE — Progress Notes (Signed)
Summary: Rx Darvocet  Phone Note Refill Request Call back at [phone] Message [id number] on January 29, 2008 7:57 AM  Refills Requested: Medication #1:  DARVOCET-N 100 100-650 MG TABS Take 1 tablet 4 times a day as needed for severe pain Form is in your in box   Method Requested: Fax to Local Pharmacy Initial call taken by: Sydell Axon,  January 29, 2008 7:58 AM  Follow-up for Phone Call        okay #60 x 0 Follow-up by: Cindee Salt MD,  January 29, 2008 8:02 AM  Additional Follow-up for Phone Call Additional follow up Details #1::        Rx faxed to pharmacy. Additional Follow-up by: Sydell Axon,  January 29, 2008 8:54 AM      Prescriptions: DARVOCET-N 100 100-650 MG TABS (PROPOXYPHENE N-APAP) Take 1 tablet 4 times a day as needed for severe pain  #60 x 0   Entered by:   Sydell Axon   Authorized by:   Cindee Salt MD   Signed by:   Sydell Axon on 01/29/2008   Method used:   Handwritten   RxID:   [id number]

## 2010-04-13 NOTE — Progress Notes (Signed)
Summary: refill request for xanax  Phone Note Refill Request Message from:  Fax from Pharmacy  Refills Requested: Medication #1:  XANAX 1 MG TABS Take 1 tablet by mouth four times a day   Last Refilled: 01/02/2008 [phone]  Initial call taken by: Lowella Petties,  January 31, 2008 10:02 AM  Follow-up for Phone Call        okay #120 x 0 Follow-up by: Cindee Salt MD,  January 31, 2008 1:32 PM  Additional Follow-up for Phone Call Additional follow up Details #1::        Rx faxed to pharmacy. Additional Follow-up by: Sydell Axon,  January 31, 2008 3:38 PM      Prescriptions: [name] 1 MG TABS (ALPRAZOLAM) Take 1 tablet by mouth four times a day  #120 x 0   Entered by:   Sydell Axon   Authorized by:   Cindee Salt MD   Signed by:   Sydell Axon on 01/31/2008   Method used:   Handwritten   RxID:   [id number]

## 2010-04-13 NOTE — Progress Notes (Signed)
SummaryPrudy Ward  Phone Note Refill Request Message [phone] on November 28, 2008 9:55 AM  Refills Requested: Medication #1:  XANAX 1 MG TABS Take 1 tablet by mouth four times a day   Last Refilled: 10/23/2008 Form on your desk    Method Requested: Fax to Local Pharmacy Initial call taken by: DeShannon Smith CMA Duncan Dull),  November 28, 2008 9:55 AM  Follow-up for Phone Call        No fax but okay #120 x 0 Follow-up by: Cindee Salt MD,  November 28, 2008 1:52 PM    Prescriptions: Lindsey Ward 1 MG TABS (ALPRAZOLAM) Take 1 tablet by mouth four times a day  #120 x 0   Entered by:   Lowella Petties CMA   Authorized by:   Cindee Salt MD   Signed by:   Lowella Petties CMA on 11/28/2008   Method used:   Telephoned to ...       [facility]  [place] [phone][facility] (retail)       [address] [place], [place]  [address]       Ph: [phone] or [phone]       Fax: [phone]   RxID:   [id number]

## 2010-04-13 NOTE — Assessment & Plan Note (Signed)
Summary: CPX/DLO   Vital Signs:  Patient profile:   60 year old female Weight:      114 pounds Temp:     98.1 degrees F oral Pulse rate:   60 / minute Pulse rhythm:   regular BP sitting:   100 / 60  (left arm) Cuff size:   regular  Vitals Entered By: Mervin Hack CMA Duncan Dull) (November 10, 2008 11:54 AM)  History of Present Illness: Chief Complaint: adult physical  Same old issues No sig change  Did have spell while walking, she felt funny Larey Seat over and then fell again ??from the heat Didn't pass out Was at outdoor pool at the time  Did have numbness in hand and went to ER Admitted in July for that (records reviewed)    Allergies: 1)  ! Ibuprofen 2)  ! Asa 3)  ! Valium 4)  ! Darvon 5)  ! * Cymbalta 6)  ! * Risperdal 7)  ! Codeine 8)  * Sulfa (Sulfonamides) Group 9)  * Skelaxin (Metaxalone) 10)  Buspar (Buspirone Hcl) 11)  Cipro (Ciprofloxacin) 12)  Effexor 13)  Lyrica (Pregabalin) 14)  Tramadol Hcl (Tramadol Hcl) 15)  Percocet (Oxycodone-Acetaminophen)  Past History:  Past medical, surgical, family and social histories (including risk factors) reviewed for relevance to current acute and chronic problems.  Past Medical History: Reviewed history from 02/19/2007 and no changes required. Chronic fatigue syndrome/fibromyalgia Vitamin  B12 deficiency, Obstipation-- put on high fiber diet Depression Hyperlipidemia GERD/peptic ulcer Migraine  Past Surgical History: 2005 Appendectomy 2004  Cholecystectomy EGD- Gastric ulcer 1986  TAH-BSO for endometriosis 1990's TMJ 1990's Bunion 2006 Ingrown toenails 06/2005 Hypotension, blood transfusion--bleeding ulcer Pneumonia 3/07 Accidental overdose  2007  Family History: Reviewed history from 05/28/2007 and no changes required. 5 sisters- HTN, DM, breast cancer (post-menop) 1 died of MI (had DM) father died of heart dz in 32s  mother died of AMI, stroke in 52s CAD-- Mom and Dad HBP:  1 or 2 sisters DM:   1 sister Breast CA:  1 sister Lung Cancer and Prostate CA:  in Aunts and Uncles  Social History: Reviewed history from 07/25/2008 and no changes required. 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  Has one grown daughter in [place].  Smokes 1/2 ppd x 30 yrs.  Does not exercise.   Not in a relationship currently.   Owns a gun. Current Smoker Alcohol use-no Drug use-no  Review of Systems General:  weight stable wakes every 2 hours--does have some daytme tiredness does wear seat belt. Eyes:  Complains of blurring; denies double vision and vision loss-1 eye. ENT:  Denies decreased hearing and ringing in ears; teeth okay--regular with dentist. CV:  Complains of lightheadness and palpitations; denies chest pain or discomfort, difficulty breathing at night, difficulty breathing while lying down, fainting, and shortness of breath with exertion; occ palpitations Limited but stable exercise tolerance. Resp:  Denies cough and shortness of breath. GI:  Complains of constipation; denies bloody stools, change in bowel habits, dark tarry stools, indigestion, nausea, and vomiting; prilosec keeps stomach quiet uses miralax as needed for constipation. GU:  Denies dysuria, hematuria, and incontinence; no sexual problems. MS:  Denies joint pain and joint swelling; chronic muscle pain. Derm:  Denies lesion(s) and rash; just had check up at derm and everything okay. Neuro:  Complains of numbness, tingling, and weakness; denies headaches; feels her legs are weak no recent headaches. Psych:  Complains of anxiety; denies depression; regular anxiety  but really not depressed still with regular panic attacks. Endo:  Denies cold intolerance and heat intolerance; the very hot weather seemed to help her joints. Heme:  Denies abnormal bruising and enlarge lymph nodes. Allergy:  Denies seasonal allergies and sneezing.  Physical Exam  General:  alert and normal appearance.     Eyes:  pupils equal, pupils round, pupils reactive to light, and no optic disk abnormalities.   Ears:  R ear normal and L ear normal.   Mouth:  no erythema and no lesions.   Neck:  supple, no masses, no thyromegaly, no carotid bruits, and no cervical lymphadenopathy.   Breasts:  no masses, no abnormal thickening, no tenderness, and no adenopathy.   Lungs:  normal respiratory effort and normal breath sounds.   Heart:  normal rate, regular rhythm, no murmur, and no gallop.   Abdomen:  soft and non-tender.   Msk:  no joint tenderness and no joint swelling.   Pulses:  1+ in feet Extremities:  no edema Neurologic:  alert & oriented X3, strength normal in all extremities, and gait normal.   Skin:  no rashes, no suspicious lesions, and no ulcerations.   Psych:  normally interactive, good eye contact, not anxious appearing, and not depressed appearing.     Impression & Recommendations:  Problem # 1:  PREVENTIVE HEALTH CARE (ICD-V70.0) Assessment Comment Only had mammo will get records of last colon--repeat when due up to date on TD  Problem # 2:  ANXIETY (ICD-300.00) Assessment: Unchanged chronic and controlled with xanax dependent but no evidence of addiction  Her updated medication list for this problem includes:    Xanax 1 Mg Tabs (Alprazolam) .Marland Kitchen... Take 1 tablet by mouth four times a day  Problem # 3:  GERD (ICD-530.81) Assessment: Unchanged okay with meds  Her updated medication list for this problem includes:    Omeprazole 20 Mg Tbec (Omeprazole) .Marland Kitchen... 2 daily  Problem # 4:  FIBROMYALGIA (ICD-729.1) Assessment: Unchanged chronic pain and need for meds  Her updated medication list for this problem includes:    Fioricet 50-325-40 Mg Tabs (Butalbital-apap-caffeine) .Marland Kitchen... 2 at headache onset    Darvocet-n 100 100-650 Mg Tabs (Propoxyphene n-apap) .Marland Kitchen... Take 1-2 by mouth once daily as needed  Complete Medication List: 1)  Neurontin 600 Mg Tabs (Gabapentin) .... 4 by mouth  at bedtime 2)  Xanax 1 Mg Tabs (Alprazolam) .... Take 1 tablet by mouth four times a day 3)  Calcium 600 1500 Mg Tabs (Calcium carbonate) .Marland Kitchen.. 1 by mouth daily 4)  Omeprazole 20 Mg Tbec (Omeprazole) .... 2 daily 5)  Fioricet 50-325-40 Mg Tabs (Butalbital-apap-caffeine) .... 2 at headache onset 6)  Darvocet-n 100 100-650 Mg Tabs (Propoxyphene n-apap) .... Take 1-2 by mouth once daily as needed 7)  Norflex 100 Mg  .Marland Kitchen.. 1 tab by mouth two times a day  Other Orders: TLB-Renal Function Panel (80069-RENAL) TLB-CBC Platelet - w/Differential (85025-CBCD) TLB-Hepatic/Liver Function Pnl (80076-HEPATIC) TLB-TSH (Thyroid Stimulating Hormone) (84443-TSH) TLB-Sedimentation Rate (ESR) (85652-ESR) Venipuncture (60454)  Patient Instructions: 1)  Please try to get colonoscopy report from Dr Madilyn Fireman [facility]) 2)  Please schedule a follow-up appointment in 4 months .   Current Allergies (reviewed today): ! IBUPROFEN ! ASA ! VALIUM ! DARVON ! * CYMBALTA ! * RISPERDAL ! CODEINE * SULFA (SULFONAMIDES) GROUP * SKELAXIN (METAXALONE) BUSPAR (BUSPIRONE HCL) CIPRO (CIPROFLOXACIN) EFFEXOR LYRICA (PREGABALIN) TRAMADOL HCL (TRAMADOL HCL) PERCOCET (OXYCODONE-ACETAMINOPHEN)

## 2010-04-13 NOTE — Assessment & Plan Note (Signed)
Summary: SHOULDER PAIN/TJ   Vital Signs:  Patient Profile:   60 Years Old Female CC:      left arm/shoulder pain Height:     63 inches Weight:      125 pounds O2 Sat:      100 % O2 treatment:    Room Air Temp:     97.6 degrees F oral Pulse rate:   87 / minute Pulse rhythm:   regular Resp:     14 per minute BP sitting:   105 / 53  (right arm)  Pt. in pain?   yes    Location:   left arm/shoulder    Intensity:   5    Type:       sharp and dull  Vitals Entered By: Lita Mains, RN                   Updated Prior Medication List: NEURONTIN 600 MG TABS (GABAPENTIN) 4 by mouth at bedtime XANAX 1 MG TABS (ALPRAZOLAM) Take 1 tablet by mouth four times a day CALCIUM 600 1500 MG TABS (CALCIUM CARBONATE) 1 by mouth daily OMEPRAZOLE 20 MG  TBEC (OMEPRAZOLE) 2 daily FIORICET 50-325-40 MG  TABS (BUTALBITAL-APAP-CAFFEINE) 2 at headache onset ORPHENADRINE CITRATE CR 100 MG XR12H-TAB (ORPHENADRINE CITRATE) take 1 by mouth two times a day NORCO 5-325 MG TABS (HYDROCODONE-ACETAMINOPHEN) take 1 by mouth three times a day as needed PREDNISONE 20 MG TABS (PREDNISONE) 60 mg by mouth once daily for 3 days then 40 mg by mouth once daily for 3 days then 20 mg by mouth once daily for 3 days. CYCLOBENZAPRINE HCL 10 MG  TABS (CYCLOBENZAPRINE HCL) 1 by mouth 2 times daily as needed for pain  Current Allergies (reviewed today): ! IBUPROFEN ! ASA ! VALIUM ! DARVON ! * CYMBALTA ! * RISPERDAL ! CODEINE * SULFA (SULFONAMIDES) GROUP * SKELAXIN (METAXALONE) BUSPAR (BUSPIRONE HCL) CIPRO (CIPROFLOXACIN) EFFEXOR LYRICA (PREGABALIN) TRAMADOL HCL (TRAMADOL HCL) PERCOCET (OXYCODONE-ACETAMINOPHEN)    History of Present Illness History from: patient Chief Complaint: left arm/shoulder pain History of Present Illness: Complaints unchanged from last visit a week ago.  Continues to complain of left scapular andd left arm pain.  Prednisoe taper and flexeril helped some, but still complaining of much  pain.    Still taking hydrocodone  every day.   She has been having discomfort for about a month now.   She sees Dr. Orlie Dakin for her fibromyalgia.   She has an appt in March.   I told her that I really don't have anthing else to offer her in regards to this chronic complaint.  This is an urgent care setting, and we do not follow chronic pain in this setting.    I advised her to follow up with Dr. Orlie Dakin for this chronic problem.  She probably needs formal PT.    REVIEW OF SYSTEMS Constitutional Symptoms      Denies fever, chills, night sweats, weight loss, weight gain, and fatigue.  Eyes       Denies change in vision, eye pain, eye discharge, glasses, contact lenses, and eye surgery. Ear/Nose/Throat/Mouth       Denies hearing loss/aids, change in hearing, ear pain, ear discharge, dizziness, frequent runny nose, frequent nose bleeds, sinus problems, sore throat, hoarseness, and tooth pain or bleeding.  Respiratory       Denies dry cough, productive cough, wheezing, shortness of breath, asthma, bronchitis, and emphysema/COPD.  Cardiovascular       Denies murmurs, chest pain,  and tires easily with exhertion.    Gastrointestinal       Denies stomach pain, nausea/vomiting, diarrhea, constipation, blood in bowel movements, and indigestion. Genitourniary       Denies painful urination, kidney stones, and loss of urinary control. Neurological       Denies paralysis, seizures, and fainting/blackouts. Musculoskeletal       Complains of muscle pain and joint pain.      Denies joint stiffness, decreased range of motion, redness, swelling, muscle weakness, and gout.      Comments: left arm and shoulder Skin       Denies bruising, unusual mles/lumps or sores, and hair/skin or nail changes.  Psych       Denies mood changes, temper/anger issues, anxiety/stress, speech problems, depression, and sleep problems. Other Comments: pain X 3 weeks. Has seen PCP.     Past History:  Past Medical  History: Reviewed history from 04/10/2009 and no changes required. Chronic fatigue syndrome/fibromyalgia Obstipation-- put on high fiber diet Depression Hyperlipidemia GERD/peptic ulcer Migraine Osteopenia  Family History: Reviewed history from 05/28/2007 and no changes required. 5 sisters- HTN, DM, breast cancer (post-menop) 1 died of MI (had DM) father died of heart dz in 55s  mother died of AMI, stroke in 64s CAD-- Mom and Dad HBP:  1 or 2 sisters DM:  1 sister Breast CA:  1 sister Lung Cancer and Prostate CA:  in Aunts and Uncles  Social History: Reviewed history from 04/10/2009 and no changes required. 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  Has one grown daughter in [place].  Smokes 1/2 ppd x 30 yrs.  Does not exercise.   Current Smoker Alcohol use-no Drug use-no Physical Exam Chest/Lungs: no rales, wheezes, or rhonchi bilateral, breath sounds equal without effort Heart: regular rate and  rhythm, no murmur Left shoulder.  Full range of motion.  Negaive impingement.    Tender in the left scapula region.   Tender in the left upper arm.    Plan New Medications/Changes: HYDROCODONE-ACETAMINOPHEN 5-500 MG TABS (HYDROCODONE-ACETAMINOPHEN) one pill every 12 hours as needed for pain.  #15 x 0, 04/20/2009, Kathrine Haddock MD  New Orders: Est. Patient Level III [phone] Ketorolac-Toradol 15mg  [J1885] Admin of Therapeutic Inj  intramuscular or subcutaneous [36644] Planning Comments:   Advised to follow up with Dr. Alphonsus Sias regarding formal PT for left arm pain Toradol injection 60 mg IM now Refilled hydrocodone.   Discharge from [place].   The patient and/or caregiver has been counseled thoroughly with regard to medications prescribed including dosage, schedule, interactions, rationale for use, and possible side effects and they verbalize understanding.  Diagnoses and expected course of recovery discussed and will return if not improved as  expected or if the condition worsens. Patient and/or caregiver verbalized understanding.  Prescriptions: HYDROCODONE-ACETAMINOPHEN 5-500 MG TABS (HYDROCODONE-ACETAMINOPHEN) one pill every 12 hours as needed for pain.  #15 x 0   Entered and Authorized by:   Kathrine Haddock MD   Signed by:   Kathrine Haddock MD on 04/20/2009   Method used:   Print then Give to Patient   RxID:   [id number]    Medication Administration  Injection # 1:    Medication: Ketorolac-Toradol 15mg     Diagnosis: SHOULDER PAIN, LEFT (ICD-719.41)    Route: IM    Site: RUOQ gluteus    Exp Date: 10/13/2010    Lot #: [id number]    Mfr: bedford    Patient tolerated injection  without complications    Given by: Lita Mains, RN (April 20, 2009 4:01 PM)  Orders Added: 1)  Est. Patient Level III [99213] 2)  Ketorolac-Toradol 15mg  [J1885] 3)  Admin of Therapeutic Inj  intramuscular or subcutaneous [16109]

## 2010-04-13 NOTE — Progress Notes (Signed)
  [facility] Note [facility]  Refills Requested: Medication #1:  DARVOCET A500 100-500 MG  TABS take 1 by mouth qid as needed pain  Method Requested: Fax to Local Pharmacy Initial call taken by: Wandra Mannan,  June 01, 2007 12:08 PM  Follow-up for Phone Call        okay #100 x 0 Follow-up by: Cindee Salt MD,  June 01, 2007 1:25 PM  Additional Follow-up for Phone Call Additional follow up Details #1::        rx faxed Additional Follow-up by: Wandra Mannan,  June 01, 2007 1:41 PM      Prescriptions: DARVOCET A500 100-500 MG  TABS (PROPOXYPHENE N-APAP) take 1 by mouth qid as needed pain  #100 x 0   Entered by:   Wandra Mannan   Authorized by:   Cindee Salt MD   Signed by:   Wandra Mannan on 06/01/2007   Method used:   Handwritten   RxID:   [id number]

## 2010-04-14 ENCOUNTER — Telehealth: Payer: Self-pay | Admitting: Internal Medicine

## 2010-04-14 ENCOUNTER — Encounter: Payer: Self-pay | Admitting: Internal Medicine

## 2010-04-15 NOTE — Progress Notes (Signed)
Summary: alprazolam   Phone Note Refill Request Message from:  Fax from Pharmacy on March 24, 2010 5:01 PM  Refills Requested: Medication #1:  XANAX 1 MG TABS Take 1 tablet by mouth four times a day   Last Refilled: 02/23/2010  Medication #2:  NORCO 5-325 MG TABS take 1 by mouth three times a day as needed [facility] [address] [phone].Fax is on your desk.   Initial call taken by: Melody Comas,  March 24, 2010 5:02 PM  Follow-up for Phone Call        okay 1 month of each x 0 Same quantities Follow-up by: Cindee Salt MD,  March 25, 2010 12:33 PM  Additional Follow-up for Phone Call Additional follow up Details #1::        Rx called to pharmacy Additional Follow-up by: DeShannon Smith CMA Duncan Dull),  March 25, 2010 1:48 PM    Prescriptions: NORCO 5-325 MG TABS (HYDROCODONE-ACETAMINOPHEN) take 1 by mouth three times a day as needed  #90 x 0   Entered by:   Mervin Hack CMA (AAMA)   Authorized by:   Cindee Salt MD   Signed by:   Mervin Hack CMA (AAMA) on 03/25/2010   Method used:   Telephoned to ...       [facility]  [place] [phone][facility]ail[facility]       [address] [place], [place]  [address]       Ph: [phone] or [phone]       Fax: [phone]   RxID:   [id number] [name] 1 MG TABS (ALPRAZOLAM) Take 1 tablet by mouth four times a day  #120 x 0   Entered by:   Mervin Hack CMA (AAMA)   Authorized by:   Cindee Salt MD   Signed by:   Mervin Hack CMA (AAMA) on 03/25/2010   Method used:   Telephoned to ...       [facility]  [place] [phone][facility] ([facility]ail[facility]       [address] [place], [place]  [address]       Ph: [phone] or [phone]       Fax: [phone]   RxID:   [id number]

## 2010-04-15 NOTE — Progress Notes (Signed)
Summary: alprazolam   Phone Note Refill Request Message from:  Fax from Pharmacy on February 23, 2010 11:28 AM  Refills Requested: Medication #1:  XANAX 1 MG TABS Take 1 tablet by mouth four times a day   Last Refilled: 01/26/2010 [facility] [address] [place]. [phone]. Fax is on your desk.   Initial call taken by: Melody Comas,  February 23, 2010 11:29 AM  Follow-up for Phone Call        okay #120 x 0 Follow-up by: Cindee Salt MD,  February 23, 2010 1:20 PM  Additional Follow-up for Phone Call Additional follow up Details #1::        Rx faxed to pharmacy Additional Follow-up by: DeShannon Smith CMA Duncan Dull),  February 23, 2010 3:24 PM    Prescriptions: [name] 1 MG TABS (ALPRAZOLAM) Take 1 tablet by mouth four times a day  #120 x 0   Entered by:   Mervin Hack CMA (AAMA)   Authorized by:   Cindee Salt MD   Signed by:   Mervin Hack CMA (AAMA) on 02/23/2010   Method used:   Handwritten   RxID:   [id number]

## 2010-04-15 NOTE — Assessment & Plan Note (Signed)
Summary: FEELING DIZZY / LFW   Vital Signs:  Patient profile:   60 year old female Weight:      115 pounds BMI:     20.44 Temp:     98.3 degrees F oral Pulse rate:   70 / minute Pulse rhythm:   regular BP sitting:   100 / 51  (left arm) Cuff size:   regular  Vitals Entered By: Mervin Hack CMA Duncan Dull) (March 25, 2010 2:40 PM) CC: dizzy   History of Present Illness: Having dizziness Nothing clear cut---not orthostatic May feel of balance when just walkking---better if she just holds on to something No falls but has bumped into things No vertigo No syncope Has been ongoing for about 3 months  Migraines more frequent Having 2-3 per month  lately---had been unusual before lately Scalp gets tight Migraine meds work okay Seems to have gotten some night sweats again  Fibromyalgia is worse   Allergies: 1)  ! Ibuprofen 2)  ! Asa 3)  ! Valium 4)  ! Darvon 5)  ! * Cymbalta 6)  ! * Risperdal 7)  ! Codeine 8)  * Sulfa (Sulfonamides) Group 9)  * Skelaxin (Metaxalone) 10)  Buspar 11)  Cipro (Ciprofloxacin) 12)  Effexor 13)  Lyrica (Pregabalin) 14)  Tramadol Hcl (Tramadol Hcl) 15)  Percocet (Oxycodone-Acetaminophen)  Past History:  Past medical, surgical, family and social histories (including risk factors) reviewed for relevance to current acute and chronic problems.  Past Medical History: Reviewed history from 04/10/2009 and no changes required. Chronic fatigue syndrome/fibromyalgia Obstipation-- put on high fiber diet Depression Hyperlipidemia GERD/peptic ulcer Migraine Osteopenia  Past Surgical History: Reviewed history from 04/10/2009 and no changes required. 2005 Appendectomy 2004  Cholecystectomy EGD- Gastric ulcer 1986  TAH-BSO for endometriosis 1990's TMJ 1990's Bunion 2006 Ingrown toenails 06/2005 Hypotension, blood transfusion--bleeding ulcer Pneumonia 3/07  Family History: Reviewed history from 05/28/2007 and no changes required. 5  sisters- HTN, DM, breast cancer (post-menop) 1 died of MI (had DM) father died of heart dz in 69s  mother died of AMI, stroke in 76s CAD-- Mom and Dad HBP:  1 or 2 sisters DM:  1 sister Breast CA:  1 sister Lung Cancer and Prostate CA:  in Aunts and Uncles  Social History: Reviewed history from 04/10/2009 and no changes required. 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  Has one grown daughter in [place].  Smokes 1/2 ppd x 30 yrs.  Does not exercise.   Current Smoker Alcohol use-no Drug use-no  Review of Systems       Not sleeping well appetite isn't that great weight is stable  Physical Exam  General:  alert and normal appearance.   Eyes:  pupils equal, pupils round, pupils reactive to light, no optic disk abnormalities, and no nystagmus.   Mouth:  no erythema and no exudates.   Neck:  supple, no masses, no thyromegaly, and no cervical lymphadenopathy.   Neurologic:  alert & oriented X3, cranial nerves II-XII intact, strength normal in all extremities, gait normal, finger-to-nose normal, and Romberg negative.     Impression & Recommendations:  Problem # 1:  DIZZINESS (ICD-780.4) Assessment New  doesn't appear to be orthostatic No clear vertigo but seems to be mild vestibular dysfunction Neuro exam is very reassuring  discussed trying brief course of meclizine Not clear if this could be related to increased migraines will check labs  Orders: TLB-Renal Function Panel (80069-RENAL) TLB-CBC Platelet - w/Differential (85025-CBCD) TLB-Hepatic/Liver Function  Pnl (80076-HEPATIC) TLB-TSH (Thyroid Stimulating Hormone) (84443-TSH) Venipuncture (16109)  Her updated medication list for this problem includes:    Meclizine Hcl 25 Mg Tabs (Meclizine hcl) .Marland Kitchen... 1 tab by mouth two times a day for dizziness as directed  Problem # 2:  FIBROMYALGIA (ICD-729.1) Assessment: Deteriorated some increased symptoms seems related to her mood--worried  about other symptoms  Her updated medication list for this problem includes:    Norco 5-325 Mg Tabs (Hydrocodone-acetaminophen) .Marland Kitchen... Take 1 by mouth three times a day as needed    Butalbital-apap-caffeine 50-325-40 Mg Caps (Butalbital-apap-caffeine) .Marland Kitchen... Take 2 tablets at onset of migraine headache    Orphenadrine Citrate Cr 100 Mg Xr12h-tab (Orphenadrine citrate) .Marland Kitchen... Take 1 by mouth two times a day  Complete Medication List: 1)  Neurontin 600 Mg Tabs (Gabapentin) .... 4 by mouth at bedtime 2)  Xanax 1 Mg Tabs (Alprazolam) .... Take 1 tablet by mouth four times a day 3)  Norco 5-325 Mg Tabs (Hydrocodone-acetaminophen) .... Take 1 by mouth three times a day as needed 4)  Butalbital-apap-caffeine 50-325-40 Mg Caps (Butalbital-apap-caffeine) .... Take 2 tablets at onset of migraine headache 5)  Calcium 600 1500 Mg Tabs (Calcium carbonate) .Marland Kitchen.. 1 by mouth daily 6)  Orphenadrine Citrate Cr 100 Mg Xr12h-tab (Orphenadrine citrate) .... Take 1 by mouth two times a day 7)  Voltaren 1 % Gel (Diclofenac sodium) .... Apply two gm to affected area two times a day to three times a day 8)  Meclizine Hcl 25 Mg Tabs (Meclizine hcl) .Marland Kitchen.. 1 tab by mouth two times a day for dizziness as directed  Patient Instructions: 1)  Please try meclizine 25mg  two times a day for the next couple of weeks. If the dizziness is better then, you can try decreasing to once a day for a week and then stop. After that, you can use it as needed if the dizziness returns 2)  Please keep appt in May for your physical Prescriptions: MECLIZINE HCL 25 MG TABS (MECLIZINE HCL) 1 tab by mouth two times a day for dizziness as directed  #60 x 2   Entered and Authorized by:   Cindee Salt MD   Signed by:   Cindee Salt MD on 03/25/2010   Method used:   [facility]        [facility] [phone]* (retail)       [address] [place], [place]  [address]       Ph: [phone] or [phone]       Fax: [phone]    RxID:   [id number]    Orders Added: 1)  Est. Patient Level IV [03474] 2)  TLB-Renal Function Panel [80069-RENAL] 3)  TLB-CBC Platelet - w/Differential [85025-CBCD] 4)  TLB-Hepatic/Liver Function Pnl [80076-HEPATIC] 5)  TLB-TSH (Thyroid Stimulating Hormone) [84443-TSH] 6)  Venipuncture [25956]    Current Allergies (reviewed today): ! IBUPROFEN ! ASA ! VALIUM ! DARVON ! * CYMBALTA ! * RISPERDAL ! CODEINE * SULFA (SULFONAMIDES) GROUP * SKELAXIN (METAXALONE) BUSPAR CIPRO (CIPROFLOXACIN) EFFEXOR LYRICA (PREGABALIN) TRAMADOL HCL (TRAMADOL HCL) PERCOCET (OXYCODONE-ACETAMINOPHEN)

## 2010-04-15 NOTE — Progress Notes (Signed)
Summary: prior Berkley Harvey is needed for orphenadrine  Phone Note From Pharmacy   Caller: [facility] Summary of Call: Prior Berkley Harvey is needed for orphenadrine, form is on your desk. Initial call taken by: Lowella Petties CMA, AAMA,  April 05, 2010 8:55 AM  Follow-up for Phone Call        form done Follow-up by: Cindee Salt MD,  April 05, 2010 1:53 PM  Additional Follow-up for Phone Call Additional follow up Details #1::        form faxed back and scanned Additional Follow-up by: DeShannon Smith CMA Duncan Dull),  April 05, 2010 2:11 PM    New/Updated Medications: ORPHENADRINE CITRATE CR 100 MG XR12H-TAB (ORPHENADRINE CITRATE) take 1 by mouth two times a day

## 2010-04-21 NOTE — Progress Notes (Signed)
Summary: prior auth denied for orphenadrine  Phone Note From Pharmacy   Caller: [facility] Summary of Call: Prior auth denied for orphenadrine, letter is on your desk. Initial call taken by: Lowella Petties CMA, AAMA,  April 14, 2010 5:11 PM  Follow-up for Phone Call        Please check with her to see if she has ever tried chlorzaxazone (robaxin) or tizandine. Her insurance will cover these Cindee Salt MD  April 15, 2010 12:58 PM   spoke with patient and she's never tried with one of those, she said which ever one Dr.Uriyah Raska thinks is ok, she would try. DeShannon Smith CMA Duncan Dull)  April 15, 2010 2:34 PM   Additional Follow-up for Phone Call Additional follow up Details #1::        Okay to send Rx for tizanidine 2mg  three times a day as needed   #90 x 1 HAve her call if not effective Cindee Salt MD  April 15, 2010 2:46 PM   Spoke with patient and advised rx was called in. Additional Follow-up by: Mervin Hack CMA Duncan Dull),  April 15, 2010 3:23 PM    New/Updated Medications: TIZANIDINE HCL 2 MG TABS (TIZANIDINE HCL) 1 tab by mouth three times a day as needed for muscle spasm Prescriptions: TIZANIDINE HCL 2 MG TABS (TIZANIDINE HCL) 1 tab by mouth three times a day as needed for muscle spasm  #90 x 1   Entered by:   Mervin Hack CMA (AAMA)   Authorized by:   Cindee Salt MD   Signed by:   Mervin Hack CMA (AAMA) on 04/15/2010   Method used:   [facility] to        [facility] [phone][facility] (retail)       [address] [place], [place]  [address]       Ph: [phone] or [phone]       Fax: [phone]   RxID:   [id number]

## 2010-04-22 ENCOUNTER — Telehealth: Payer: Self-pay | Admitting: Internal Medicine

## 2010-04-29 NOTE — Progress Notes (Signed)
Summary: hydrocodone  Phone Note Refill Request Message from:  Fax from Pharmacy on April 22, 2010 11:56 AM  Refills Requested: Medication #1:  NORCO 5-325 MG TABS take 1 by mouth three times a day as needed   Last Refilled: 03/25/2010  Medication #2:  XANAX 1 MG TABS Take 1 tablet by mouth four times a day   Last Refilled: 03/25/2010 [facility] [address] [phone]. Fax is on your desk.   Initial call taken by: Melody Comas,  April 22, 2010 11:56 AM  Follow-up for Phone Call        okay for 1 month of each in usual quantities Cindee Salt MD  April 22, 2010 1:05 PM   rx faxed to pharmacy Follow-up by: Mervin Hack CMA Duncan Dull),  April 22, 2010 2:02 PM    Prescriptions: NORCO 5-325 MG TABS (HYDROCODONE-ACETAMINOPHEN) take 1 by mouth three times a day as needed  #90 x 0   Entered by:   Mervin Hack CMA (AAMA)   Authorized by:   Cindee Salt MD   Signed by:   Mervin Hack CMA (AAMA) on 04/22/2010   Method used:   Handwritten   RxID:   [id number] XANAX 1 MG TABS (ALPRAZOLAM) Take 1 tablet by mouth four times a day  #120 x 0   Entered by:   Mervin Hack CMA (AAMA)   Authorized by:   Cindee Salt MD   Signed by:   Mervin Hack CMA (AAMA) on 04/22/2010   Method used:   Handwritten   RxID:   [id number]

## 2010-05-06 ENCOUNTER — Ambulatory Visit (INDEPENDENT_AMBULATORY_CARE_PROVIDER_SITE_OTHER): Payer: Self-pay | Admitting: Emergency Medicine

## 2010-05-06 ENCOUNTER — Encounter: Payer: Self-pay | Admitting: Emergency Medicine

## 2010-05-06 DIAGNOSIS — M549 Dorsalgia, unspecified: Secondary | ICD-10-CM | POA: Insufficient documentation

## 2010-05-06 LAB — CONVERTED CEMR LAB
Bilirubin Urine: NEGATIVE
Blood in Urine, dipstick: NEGATIVE
Glucose, Urine, Semiquant: NEGATIVE
Ketones, urine, test strip: NEGATIVE
Nitrite: NEGATIVE
Protein, U semiquant: NEGATIVE
Specific Gravity, Urine: <1.005
Urobilinogen, UA: 0.2
WBC Urine, dipstick: NEGATIVE
pH: 6

## 2010-05-11 NOTE — Assessment & Plan Note (Signed)
Summary: POSS UTI/TJ rm 4   Vital Signs:  Patient Profile:   60 Years Old Female CC:      LBP, possible UTI, vommiting Height:     63 inches Weight:      115.5 pounds O2 Sat:      98 % O2 treatment:    Room Air Temp:     98.2 degrees F oral Pulse rate:   76 / minute Resp:     16 per minute BP sitting:   105 / 69  (left arm) Cuff size:   regular  Vitals Entered By: Clemens Catholic LPN (May 06, 2010 1:17 PM)                  Updated Prior Medication List: NEURONTIN 600 MG TABS (GABAPENTIN) 4 by mouth at bedtime XANAX 1 MG TABS (ALPRAZOLAM) Take 1 tablet by mouth four times a day NORCO 5-325 MG TABS (HYDROCODONE-ACETAMINOPHEN) take 1 by mouth three times a day as needed BUTALBITAL-APAP-CAFFEINE 50-325-40 MG CAPS (BUTALBITAL-APAP-CAFFEINE) Take 2 tablets at onset of migraine headache CALCIUM 600 1500 MG TABS (CALCIUM CARBONATE) 1 by mouth daily VOLTAREN 1 % GEL (DICLOFENAC SODIUM) Apply two gm to affected area two times a day to three times a day MECLIZINE HCL 25 MG TABS (MECLIZINE HCL) 1 tab by mouth two times a day for dizziness as directed TIZANIDINE HCL 2 MG TABS (TIZANIDINE HCL) 1 tab by mouth three times a day as needed for muscle spasm  Current Allergies (reviewed today): ! IBUPROFEN ! ASA ! VALIUM ! DARVON ! * CYMBALTA ! * RISPERDAL ! CODEINE * SULFA (SULFONAMIDES) GROUP * SKELAXIN (METAXALONE) BUSPAR CIPRO (CIPROFLOXACIN) EFFEXOR LYRICA (PREGABALIN) TRAMADOL HCL (TRAMADOL HCL) PERCOCET (OXYCODONE-ACETAMINOPHEN)History of Present Illness Chief Complaint: LBP, possible UTI, vommiting History of Present Illness: 5 days ago, vomited x 3, but that resolved.She feels she strained L low back, and soreness there persists.  Location: L flank and lumbar Onset: 5 days Description/Quality of Pain: soreness Intensity of pain: 5/10 Modifying Factors: worse to move. No radiation or paresthesias. No diarrhea or blood in stool. Appetite and BM's normal. Mimimal  nausea. --Complex PMH, fibromyalgia. On chronic neurontin (and as needed hydrocodone) by PCP  REVIEW OF SYSTEMS Constitutional Symptoms       Complains of chills.     Denies fever, night sweats, weight loss, weight gain, and fatigue.  Eyes       Denies change in vision, eye pain, eye discharge, glasses, contact lenses, and eye surgery. Ear/Nose/Throat/Mouth       Denies hearing loss/aids, change in hearing, ear pain, ear discharge, dizziness, frequent runny nose, frequent nose bleeds, sinus problems, sore throat, hoarseness, and tooth pain or bleeding.  Respiratory       Complains of shortness of breath.      Denies dry cough, productive cough, wheezing, asthma, bronchitis, and emphysema/COPD.  Cardiovascular       Denies murmurs, chest pain, and tires easily with exhertion.    Gastrointestinal       Complains of stomach pain and nausea/vomiting.      Denies diarrhea, constipation, blood in bowel movements, and indigestion. Genitourniary       Denies painful urination, kidney stones, and loss of urinary control. Neurological       Complains of headaches.      Denies paralysis, seizures, and fainting/blackouts. Musculoskeletal       Denies muscle pain, joint pain, joint stiffness, decreased range of motion, redness, swelling, muscle weakness, and gout.  Skin  Denies bruising, unusual mles/lumps or sores, and hair/skin or nail changes.  Psych       Denies mood changes, temper/anger issues, anxiety/stress, speech problems, depression, and sleep problems. Other Comments: pt states that Saturday she had vommiting all day. since yesterday she has had LBP and she is achy. she states she had the same s/s when she was a child and had a UTI.   Past History:  Past Medical History: Reviewed history from 04/10/2009 and no changes required. Chronic fatigue syndrome/fibromyalgia Obstipation-- put on high fiber diet Depression Hyperlipidemia GERD/peptic ulcer Migraine Osteopenia  Past  Surgical History: Reviewed history from 04/10/2009 and no changes required. 2005 Appendectomy 2004  Cholecystectomy EGD- Gastric ulcer 1986  TAH-BSO for endometriosis 1990's TMJ 1990's Bunion 2006 Ingrown toenails 06/2005 Hypotension, blood transfusion--bleeding ulcer Pneumonia 3/07  Family History: Reviewed history from 05/28/2007 and no changes required. 5 sisters- HTN, DM, breast cancer (post-menop) 1 died of MI (had DM) father died of heart dz in 57s  mother died of AMI, stroke in 37s CAD-- Mom and Dad HBP:  1 or 2 sisters DM:  1 sister Breast CA:  1 sister Lung Cancer and Prostate CA:  in Aunts and Uncles  Social History: Reviewed history from 04/10/2009 and no changes required. 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  Has one grown daughter in [place].  Smokes 1/2 ppd x 30 yrs.  Does not exercise.   Current Smoker Alcohol use-no Drug use-no Physical Exam General appearance: well developed, well nourished, no acute distress Eyes: conjunctivae and lids normal Oral/Pharynx: tongue normal, posterior pharynx without erythema or exudate Chest/Lungs: no rales, wheezes, or rhonchi bilateral, breath sounds equal without effort Heart: regular rate and  rhythm, no murmur Abdomen: mild diffuse tenderness, abdomen soft without obvious organomegaly. No masses, guarding, or rebound. +normoactive bs's x 4. Extremities: normal extremities Skin: no obvious rashes or lesions Assessment New Problems: BACK PAIN (ICD-724.5)  Likely acute L flank and L lumbar strain, superimposed on chronic fibromyalgia. UA wnl.--Vague abdom. pain, no evidence of acute intra-abd process (she is s/p appendectomy and cholestectomy in the past)  Patient Education: Patient and/or caregiver instructed in the following: rest, fluids.  Plan New Medications/Changes: PROMETHAZINE HCL 25 MG TABS (PROMETHAZINE HCL) 1 by mouth q 6 hrs as needed nausea  #12 x 0, 05/06/2010,  Lajean Manes MD ULTRACET 37.5-325 MG TABS (TRAMADOL-ACETAMINOPHEN) 1 q 6 hrs as needed mod-severe pain  #20 x 0, 05/06/2010, Lajean Manes MD  New Orders: UA Dipstick w/o Micro (automated)  [81003] Est. Patient Level IV [16109] Planning Comments:   I advised ordering a cbc, but she declined.--Advised pt that we cannot refill the ultracet.  Follow Up: PCP within 3 days, sooner if worse or new sxs.  The patient and/or caregiver has been counseled thoroughly with regard to medications prescribed including dosage, schedule, interactions, rationale for use, and possible side effects and they verbalize understanding.  Diagnoses and expected course of recovery discussed and will return if not improved as expected or if the condition worsens. Patient and/or caregiver verbalized understanding.  Prescriptions: PROMETHAZINE HCL 25 MG TABS (PROMETHAZINE HCL) 1 by mouth q 6 hrs as needed nausea  #12 x 0   Entered and Authorized by:   Lajean Manes MD   Signed by:   Lajean Manes MD on 05/06/2010   Method used:   Handwritten   RxID:   [id number] ULTRACET 37.5-325 MG TABS (TRAMADOL-ACETAMINOPHEN) 1 q 6 hrs as needed mod-severe pain  #  20 x 0   Entered and Authorized by:   Lajean Manes MD   Signed by:   Lajean Manes MD on 05/06/2010   Method used:   Handwritten   RxID:   [id number]   Orders Added: 1)  UA Dipstick w/o Micro (automated)  [81003] 2)  Est. Patient Level IV [57846]    Laboratory Results   Urine Tests  Date/Time Received: May 06, 2010 2:18 PM  Date/Time Reported: May 06, 2010 2:18 PM   Routine Urinalysis   Color: lt. yellow Appearance: Clear Glucose: negative   (Normal Range: Negative) Bilirubin: negative   (Normal Range: Negative) Ketone: negative   (Normal Range: Negative) Spec. Gravity: <1.005   (Normal Range: 1.003-1.035) Blood: negative   (Normal Range: Negative) pH: 6.0   (Normal Range: 5.0-8.0) Protein: negative   (Normal Range:  Negative) Urobilinogen: 0.2   (Normal Range: 0-1) Nitrite: negative   (Normal Range: Negative) Leukocyte Esterace: negative   (Normal Range: Negative)

## 2010-05-11 NOTE — Medication Information (Signed)
Summary: Denial for Orphenadrine Citrate/[facility]  Denial for Orphenadrine Citrate/[facility]   Imported By: Lanelle Bal 05/06/2010 14:16:11  _____________________________________________________________________  External Attachment:    Type:   Image     Comment:   External Document

## 2010-05-14 ENCOUNTER — Telehealth: Payer: Self-pay | Admitting: Family Medicine

## 2010-05-20 NOTE — Progress Notes (Signed)
Summary: needs muscle relaxer  Phone Note Call from Patient Call back at Home Phone [phone]   Caller: Patient Summary of Call: Pt had been taking tizanidine for muscle spasms.  She had to stop taking these because it was causing blurred vision and made her gums swell.  It didnt help with the muscle spasms.  She is asking if there is something else she can take for the muscle spasms- she feels like every muscle in her body is drawn up.  [facility] [facility]  She has tried norflex, but insurance wont pay for that and she didnt notice any improvement with that either. Initial call taken by: Lowella Petties CMA, AAMA,  May 14, 2010 4:15 PM  Follow-up for Phone Call        can try flexeril 5mg  1-2 two times a day #30.  sedation precautions.  plz call pt. Follow-up by: Eustaquio Boyden  MD,  May 14, 2010 4:19 PM  Additional Follow-up for Phone Call Additional follow up Details #1::        Patient notified.  Additional Follow-up by: Janee Morn CMA Duncan Dull),  May 14, 2010 4:55 PM    New/Updated Medications: FLEXERIL 5 MG TABS (CYCLOBENZAPRINE HCL) take one-two two times a day as needed muscle spasm Prescriptions: FLEXERIL 5 MG TABS (CYCLOBENZAPRINE HCL) take one-two two times a day as needed muscle spasm  #30 x 0   Entered and Authorized by:   Eustaquio Boyden  MD   Signed by:   Eustaquio Boyden  MD on 05/14/2010   Method used:   [facility]        [facility] [phone]* (retail)       [address] [place], [place]  [address]       Ph: [phone] or [phone]       Fax: [phone]   RxID:   [id number]

## 2010-05-24 ENCOUNTER — Telehealth: Payer: Self-pay | Admitting: Internal Medicine

## 2010-05-27 ENCOUNTER — Encounter: Payer: Self-pay | Admitting: Internal Medicine

## 2010-05-27 ENCOUNTER — Telehealth: Payer: Self-pay | Admitting: Internal Medicine

## 2010-06-01 NOTE — Progress Notes (Signed)
Summary: wants to increase flexeril dose  Phone Note Call from Patient Call back at Home Phone [phone]   Caller: Patient Summary of Call: Pt was given flexeril 5 mg's.  She is asking if ok to take one and one half of these to see if that will do better to help relax her muscles.     Lowella Petties CMA, AAMA  May 25, 2010 8:06 AM    Follow-up for Phone Call        with all the medications she takes I would not recommend this  We will need to discuss this at her next visit Follow-up by: Cindee Salt MD,  May 25, 2010 9:03 AM  Additional Follow-up for Phone Call Additional follow up Details #1::        Advised pt, she will call back to schedule appt. Additional Follow-up by: Lowella Petties CMA, AAMA,  May 25, 2010 9:59 AM

## 2010-06-01 NOTE — Progress Notes (Signed)
Summary: xanax   Phone Note Refill Request Message from:  Fax from Pharmacy on May 24, 2010 3:19 PM  Refills Requested: Medication #1:  XANAX 1 MG TABS Take 1 tablet by mouth four times a day   Last Refilled: 04/22/2010 Refill request from [facility] [address] [phone]  Initial call taken by: Melody Comas,  May 24, 2010 3:19 PM  Follow-up for Phone Call        okay #120 x 0 Follow-up by: Cindee Salt MD,  May 24, 2010 5:42 PM  Additional Follow-up for Phone Call Additional follow up Details #1::        Rx faxed to pharmacy Additional Follow-up by: DeShannon Katrinka Blazing CMA Duncan Dull),  May 24, 2010 6:14 PM    Prescriptions: [name] 1 MG TABS (ALPRAZOLAM) Take 1 tablet by mouth four times a day  #120 x 0   Entered by:   Mervin Hack CMA (AAMA)   Authorized by:   Cindee Salt MD   Signed by:   Mervin Hack CMA (AAMA) on 05/24/2010   Method used:   Handwritten   RxID:   [id number]

## 2010-06-01 NOTE — Progress Notes (Signed)
SummarySarita Ward  Phone Note Refill Request Message [phone] on May 27, 2010 4:27 PM  Refills Requested: Medication #1:  BUTALBITAL-APAP-CAFFEINE 50-325-40 MG CAPS Take 2 tablets at onset of migraine headache   Last Refilled: 05/03/2010 E-Scribe Request, ok to fill?   Method Requested: Electronic Initial call taken by: Mervin Hack CMA Duncan Dull),  May 27, 2010 4:28 PM  Follow-up for Phone Call        okay #30 x 0 Follow-up by: Cindee Salt MD,  May 28, 2010 7:44 AM  Additional Follow-up for Phone Call Additional follow up Details #1::        Rx faxed to pharmacy Additional Follow-up by: DeShannon Smith CMA Duncan Dull),  May 28, 2010 8:50 AM    Prescriptions: BUTALBITAL-APAP-CAFFEINE 50-325-40 MG CAPS [facility]) Take 2 tablets at onset of migraine headache  #30 Tablet x 0   Entered by:   Mervin Hack CMA (AAMA)   Authorized by:   Cindee Salt MD   Signed by:   Mervin Hack CMA (AAMA) on 05/28/2010   Method used:   Electronically to        [facility] [phone]* (retail)       [address] [place], [place]  [address]       Ph: [phone] or [phone]       Fax: [phone]   RxID:   [id number]

## 2010-06-09 ENCOUNTER — Other Ambulatory Visit: Payer: Self-pay | Admitting: Gastroenterology

## 2010-06-09 ENCOUNTER — Ambulatory Visit
Admission: RE | Admit: 2010-06-09 | Discharge: 2010-06-09 | Disposition: A | Discharge status: Home / Self Care | Payer: Medicare Other | Source: Ambulatory Visit | Attending: Gastroenterology | Admitting: Gastroenterology

## 2010-06-11 ENCOUNTER — Encounter: Payer: Self-pay | Admitting: Internal Medicine

## 2010-06-11 ENCOUNTER — Ambulatory Visit (INDEPENDENT_AMBULATORY_CARE_PROVIDER_SITE_OTHER): Payer: Medicare Other | Admitting: Internal Medicine

## 2010-06-11 VITALS — BP 112/48 | HR 72 | Temp 98.1°F | Ht 63.0 in | Wt 116.0 lb

## 2010-06-11 DIAGNOSIS — R109 Unspecified abdominal pain: Secondary | ICD-10-CM

## 2010-06-11 DIAGNOSIS — K5909 Other constipation: Secondary | ICD-10-CM

## 2010-06-11 DIAGNOSIS — K5900 Constipation, unspecified: Secondary | ICD-10-CM

## 2010-06-11 NOTE — Patient Instructions (Signed)
Please try a dulcolax suppository and then a fleet's enema if needed If unsuccessful, may have to try 1/2 bottle of magnesium citrate Please add the senekot twice a day to the miralax Keep your regular appointment

## 2010-06-11 NOTE — Progress Notes (Signed)
  Subjective:    Patient ID: Lindsey Ward, female    DOB: [birth year], 60 y.o.   MRN: [id number]  HPI "I have been very sick all this month" Went to [facility] urgent care in [place] with vomiting almost a month ago Vomiting had mostly resolved with persistent nausea Noted strained back from the vomiting Got Rx for flexeril and also ultracet (which she can't take)  "It feels like all my nerves are balled up" 1 week ago, got sick again with vomiting. Noted swelling in stomach Called her surgeons due to past intestinal problems Went to imaging yesterday--this showed no obstruction  Has had trouble moving bowels Told to take miralax 4 times --she took it 2-3 times per day  Past Medical History  Diagnosis Date  . Chronic fatigue   . Obstipation   . Depression   . Hyperlipemia   . GERD (gastroesophageal reflux disease)   . PUD (peptic ulcer disease)   . Migraine   . Osteopenia     Past Surgical History  Procedure Date  . Appendectomy 2005  . Cholecystectomy 2004  . Total abdominal hysterectomy w/ bilateral salpingoophorectomy 1986    Family History  Problem Relation Age of Onset  . Heart disease Mother     AMI  . Stroke Mother   . Heart disease Father   . Hypertension Sister   . Hypertension Sister   . Heart disease Sister     MI  . Diabetes Sister     History   Social History  . Marital Status: Divorced    Spouse Name: N/A    Number of Children: 1  . Years of Education: N/A   Occupational History  . DISABLED    Social History Main Topics  . Smoking status: Current Everyday Smoker -- 1.0 packs/day for 40 years    Types: Cigarettes  . Smokeless tobacco: Never Used  . Alcohol Use: No  . Drug Use: No  . Sexually Active: Not on file   Other Topics Concern  . Not on file   Social History Narrative  . No narrative on file    Review of Systems No urinary symptoms Does have some lower back pain    Objective:   Physical Exam  Constitutional: She  appears well-developed and well-nourished. No distress.  Pulmonary/Chest: Effort normal and breath sounds normal. No respiratory distress. She has no wheezes. She has no rales.  Abdominal: Soft. Bowel sounds are normal. She exhibits no mass. There is tenderness. There is no rebound and no guarding.       Sensitive in LLQ>RLQ  Psychiatric: Her behavior is normal. Thought content normal.          Assessment & Plan:

## 2010-06-20 LAB — BASIC METABOLIC PANEL
BUN: 14 mg/dL (ref 6–23)
CO2: 30 mEq/L (ref 19–32)
Calcium: 9.9 mg/dL (ref 8.4–10.5)
Chloride: 105 mEq/L (ref 96–112)
Creatinine, Ser: 0.75 mg/dL (ref 0.4–1.2)
GFR calc Af Amer: >60 mL/min (ref >60)
GFR calc non Af Amer: >60 mL/min (ref >60)
Glucose, Bld: 84 mg/dL (ref 70–99)
Potassium: 3.8 mEq/L (ref 3.5–5.1)
Sodium: 142 mEq/L (ref 135–145)

## 2010-06-20 LAB — DIFFERENTIAL
Basophils Absolute: 0 10*3/uL (ref 0.0–0.1)
Basophils Relative: 1 % (ref 0–1)
Eosinophils Absolute: 0.2 10*3/uL (ref 0.0–0.7)
Eosinophils Relative: 3 % (ref 0–5)
Lymphocytes Relative: 41 % (ref 12–46)
Lymphs Abs: 2.6 10*3/uL (ref 0.7–4.0)
Monocytes Absolute: 0.4 10*3/uL (ref 0.1–1.0)
Monocytes Relative: 6 % (ref 3–12)
Neutro Abs: 3.2 10*3/uL (ref 1.7–7.7)
Neutrophils Relative %: 50 % (ref 43–77)

## 2010-06-20 LAB — POCT CARDIAC MARKERS
CKMB, poc: <1 ng/mL — ABNORMAL LOW (ref 1.0–8.0)
Myoglobin, poc: 62.3 ng/mL (ref 12–200)
Troponin i, poc: <0.05 ng/mL (ref 0.00–0.09)

## 2010-06-20 LAB — CBC
HCT: 36.5 % (ref 36.0–46.0)
Hemoglobin: 12.9 g/dL (ref 12.0–15.0)
MCHC: 35.3 g/dL (ref 30.0–36.0)
MCV: 95.5 fL (ref 78.0–100.0)
Platelets: 207 10*3/uL (ref 150–400)
RBC: 3.82 MIL/uL — ABNORMAL LOW (ref 3.87–5.11)
RDW: 12.1 % (ref 11.5–15.5)
WBC: 6.4 10*3/uL (ref 4.0–10.5)

## 2010-06-22 ENCOUNTER — Other Ambulatory Visit: Payer: Self-pay | Admitting: *Deleted

## 2010-06-22 NOTE — Telephone Encounter (Signed)
Form on your desk, last refill 03.12.2012

## 2010-06-23 MED ORDER — ALPRAZOLAM 1 MG PO TABS: 1.0000 mg | ORAL_TABLET | Freq: Four times a day (QID) | ORAL | Status: DC

## 2010-06-23 NOTE — Telephone Encounter (Signed)
.  rx faxed to pharmacy, manually.  

## 2010-06-23 NOTE — Telephone Encounter (Signed)
Okay #120 x 0 

## 2010-06-26 ENCOUNTER — Other Ambulatory Visit: Payer: Self-pay | Admitting: Internal Medicine

## 2010-06-29 LAB — BASIC METABOLIC PANEL
BUN: 13 mg/dL (ref 6–23)
CO2: 30 mEq/L (ref 19–32)
Calcium: 9.8 mg/dL (ref 8.4–10.5)
Chloride: 104 mEq/L (ref 96–112)
Creatinine, Ser: 0.81 mg/dL (ref 0.4–1.2)
GFR calc Af Amer: >60 mL/min (ref >60)
GFR calc non Af Amer: >60 mL/min (ref >60)
Glucose, Bld: 94 mg/dL (ref 70–99)
Potassium: 3.5 mEq/L (ref 3.5–5.1)
Sodium: 141 mEq/L (ref 135–145)

## 2010-06-29 LAB — DIFFERENTIAL
Basophils Absolute: 0.1 10*3/uL (ref 0.0–0.1)
Basophils Relative: 1 % (ref 0–1)
Eosinophils Absolute: 0.3 10*3/uL (ref 0.0–0.7)
Eosinophils Relative: 4 % (ref 0–5)
Lymphocytes Relative: 46 % (ref 12–46)
Lymphs Abs: 3.6 10*3/uL (ref 0.7–4.0)
Monocytes Absolute: 0.4 10*3/uL (ref 0.1–1.0)
Monocytes Relative: 5 % (ref 3–12)
Neutro Abs: 3.6 10*3/uL (ref 1.7–7.7)
Neutrophils Relative %: 45 % (ref 43–77)

## 2010-06-29 LAB — CBC
HCT: 35.9 % — ABNORMAL LOW (ref 36.0–46.0)
Hemoglobin: 12.6 g/dL (ref 12.0–15.0)
MCHC: 35.1 g/dL (ref 30.0–36.0)
MCV: 96.4 fL (ref 78.0–100.0)
Platelets: 236 10*3/uL (ref 150–400)
RBC: 3.73 MIL/uL — ABNORMAL LOW (ref 3.87–5.11)
RDW: 12.3 % (ref 11.5–15.5)
WBC: 7.9 10*3/uL (ref 4.0–10.5)

## 2010-06-29 LAB — POCT CARDIAC MARKERS
CKMB, poc: <1 ng/mL — ABNORMAL LOW (ref 1.0–8.0)
CKMB, poc: <1 ng/mL — ABNORMAL LOW (ref 1.0–8.0)
Myoglobin, poc: 47 ng/mL (ref 12–200)
Myoglobin, poc: 48.2 ng/mL (ref 12–200)
Troponin i, poc: <0.05 ng/mL (ref 0.00–0.09)
Troponin i, poc: <0.05 ng/mL (ref 0.00–0.09)

## 2010-06-29 LAB — CK TOTAL AND CKMB (NOT AT ARMC)
CK, MB: 1.6 ng/mL (ref 0.3–4.0)
Relative Index: INVALID (ref 0.0–2.5)
Total CK: 70 U/L (ref 7–177)

## 2010-06-29 LAB — TROPONIN I: Troponin I: <0.01 ng/mL (ref 0.00–0.06)

## 2010-07-05 ENCOUNTER — Other Ambulatory Visit: Payer: Self-pay | Admitting: Internal Medicine

## 2010-07-14 ENCOUNTER — Encounter: Payer: Self-pay | Admitting: Internal Medicine

## 2010-07-14 ENCOUNTER — Ambulatory Visit (INDEPENDENT_AMBULATORY_CARE_PROVIDER_SITE_OTHER): Payer: Medicare Other | Admitting: Internal Medicine

## 2010-07-14 VITALS — BP 100/60 | HR 64 | Temp 98.1°F | Ht 62.5 in | Wt 114.0 lb

## 2010-07-14 DIAGNOSIS — IMO0001 Reserved for inherently not codable concepts without codable children: Secondary | ICD-10-CM

## 2010-07-14 DIAGNOSIS — K219 Gastro-esophageal reflux disease without esophagitis: Secondary | ICD-10-CM

## 2010-07-14 DIAGNOSIS — G43909 Migraine, unspecified, not intractable, without status migrainosus: Secondary | ICD-10-CM

## 2010-07-14 DIAGNOSIS — Z0000 Encounter for general adult medical examination without abnormal findings: Secondary | ICD-10-CM | POA: Insufficient documentation

## 2010-07-14 DIAGNOSIS — F411 Generalized anxiety disorder: Secondary | ICD-10-CM

## 2010-07-14 DIAGNOSIS — F329 Major depressive disorder, single episode, unspecified: Secondary | ICD-10-CM

## 2010-07-14 LAB — CBC WITH DIFFERENTIAL/PLATELET
Basophils Absolute: 0 10*3/uL (ref 0.0–0.1)
Basophils Relative: 0.6 % (ref 0.0–3.0)
Eosinophils Absolute: 0.1 10*3/uL (ref 0.0–0.7)
Eosinophils Relative: 1.8 % (ref 0.0–5.0)
HCT: 39.1 % (ref 36.0–46.0)
Hemoglobin: 13.5 g/dL (ref 12.0–15.0)
Lymphocytes Relative: 30.2 % (ref 12.0–46.0)
Lymphs Abs: 2.3 10*3/uL (ref 0.7–4.0)
MCHC: 34.5 g/dL (ref 30.0–36.0)
MCV: 96 fl (ref 78.0–100.0)
Monocytes Absolute: 0.5 10*3/uL (ref 0.1–1.0)
Monocytes Relative: 6.7 % (ref 3.0–12.0)
Neutro Abs: 4.6 10*3/uL (ref 1.4–7.7)
Neutrophils Relative %: 60.7 % (ref 43.0–77.0)
Platelets: 245 10*3/uL (ref 150.0–400.0)
RBC: 4.07 Mil/uL (ref 3.87–5.11)
RDW: 12.1 % (ref 11.5–14.6)
WBC: 7.5 10*3/uL (ref 4.5–10.5)

## 2010-07-14 LAB — BASIC METABOLIC PANEL
BUN: 10 mg/dL (ref 6–23)
CO2: 30 mEq/L (ref 19–32)
Calcium: 9.6 mg/dL (ref 8.4–10.5)
Chloride: 102 mEq/L (ref 96–112)
Creatinine, Ser: 0.7 mg/dL (ref 0.4–1.2)
GFR: 90.15 mL/min (ref >60.00)
Glucose, Bld: 83 mg/dL (ref 70–99)
Potassium: 4.1 mEq/L (ref 3.5–5.1)
Sodium: 142 mEq/L (ref 135–145)

## 2010-07-14 LAB — HEPATIC FUNCTION PANEL
ALT: 14 U/L (ref 0–35)
AST: 20 U/L (ref 0–37)
Albumin: 4.1 g/dL (ref 3.5–5.2)
Alkaline Phosphatase: 123 U/L — ABNORMAL HIGH (ref 39–117)
Bilirubin, Direct: 0.1 mg/dL (ref 0.0–0.3)
Total Bilirubin: 0.6 mg/dL (ref 0.3–1.2)
Total Protein: 6.9 g/dL (ref 6.0–8.3)

## 2010-07-14 LAB — TSH: TSH: 1.85 u[IU]/mL (ref 0.35–5.50)

## 2010-07-14 NOTE — Patient Instructions (Signed)
Please continue the senna and miralax every day. It would be okay to uses milk of magnesia occasionally---like once a week--if your bowels don't move

## 2010-07-14 NOTE — Progress Notes (Signed)
Subjective:    Patient ID: Lindsey Ward, female    DOB: 05-Dec-1950, 60 y.o.   MRN: [id number]  HPI Here for physical  Ongoing constipation problems Relates to past colon surgery Miralax, senna and even magnesium citrate not enough  Fibromyalgia and headaches are fairly stable Perhaps slightly increased freq of headaches  Still smoking Doesn't feel she can quit due to her nerves Tries to limit them (now ~1/2PPD)  Current outpatient prescriptions:ALPRAZolam (XANAX) 1 MG tablet, Take 1 tablet (1 mg total) by mouth 4 (four) times daily., Disp: 120 tablet, Rfl: 0;  butalbital-acetaminophen-caffeine (FIORICET, ESGIC) 50-325-40 MG per tablet, TAKE 2 TABLETS ONSET OF MIGRAINE HEADACHE, Disp: 30 tablet, Rfl: 0;  cyclobenzaprine (FLEXERIL) 5 MG tablet, Take 5 mg by mouth 2 (two) times daily as needed.  , Disp: , Rfl:  gabapentin (NEURONTIN) 600 MG tablet, TAKE 4 TABLETS AT BEDTIME, Disp: 120 tablet, Rfl: 4;  HYDROcodone-acetaminophen (NORCO) 5-325 MG per tablet, Take 1 tablet by mouth every 6 (six) hours as needed.  , Disp: , Rfl: ;  meclizine (ANTIVERT) 25 MG tablet, Take 25 mg by mouth 2 (two) times daily as needed. As needed for Dizziness , Disp: , Rfl: ;  polyethylene glycol (MIRALAX / GLYCOLAX) packet, Take 17 g by mouth 2 (two) times daily.  , Disp: , Rfl:  promethazine (PHENERGAN) 25 MG tablet, Take 25 mg by mouth every 6 (six) hours as needed.  , Disp: , Rfl: ;  sennosides-docusate sodium (SENOKOT-S) 8.6-50 MG tablet, Take 2 tablets by mouth 2 (two) times daily.  , Disp: , Rfl:   Past Medical History  Diagnosis Date  . Chronic fatigue   . Obstipation   . Depression   . Hyperlipemia   . GERD (gastroesophageal reflux disease)   . PUD (peptic ulcer disease)   . Migraine   . Osteopenia     Past Surgical History  Procedure Date  . Appendectomy 2005  . Cholecystectomy 2004  . Total abdominal hysterectomy w/ bilateral salpingoophorectomy 1986    Family History  Problem Relation Age  of Onset  . Heart disease Mother     AMI  . Stroke Mother   . Heart disease Father   . Hypertension Sister   . Hypertension Sister   . Heart disease Sister     MI  . Diabetes Sister     History   Social History  . Marital Status: Divorced    Spouse Name: N/A    Number of Children: 1  . Years of Education: N/A   Occupational History  . DISABLED    Social History Main Topics  . Smoking status: Current Everyday Smoker -- 1.0 packs/day for 40 years    Types: Cigarettes  . Smokeless tobacco: Never Used  . Alcohol Use: No  . Drug Use: No  . Sexually Active: Not on file   Other Topics Concern  . Not on file   Social History Narrative  . No narrative on file   Review of Systems  Constitutional: Positive for fatigue. Negative for unexpected weight change.  HENT: Positive for dental problem. Negative for hearing loss and tinnitus.        Intermittent problems with TMJ  Eyes: Negative for visual disturbance.       No sig vision loss but has changes in right eye--has eye exam coming up No diplopia  Respiratory: Positive for cough. Negative for chest tightness and shortness of breath.        Occ cough when  brushing teeth  Cardiovascular: Positive for palpitations.       Rare palpitations  Gastrointestinal: Positive for vomiting, abdominal pain and constipation. Negative for nausea and blood in stool.       Occ vomiting when stomach "acts up" Heartburn controlled on omeprazole  Genitourinary: Negative for dysuria, urgency, vaginal bleeding, difficulty urinating and dyspareunia.  Musculoskeletal: Positive for myalgias, back pain and arthralgias. Negative for joint swelling.       Some low back pain   Skin: Negative for rash.       No suspicious lesions---goes to derm regularly  Neurological: Positive for weakness and headaches. Negative for dizziness, syncope and numbness.  Hematological: Negative for adenopathy. Does not bruise/bleed easily.  Psychiatric/Behavioral:  Positive for sleep disturbance and dysphoric mood. The patient is nervous/anxious.        Mild intermittent mood problems       Objective:   Physical Exam  Constitutional: She is oriented to person, place, and time. She appears well-developed and well-nourished. No distress.  HENT:  Head: Normocephalic and atraumatic.  Right Ear: External ear normal.  Left Ear: External ear normal.  Mouth/Throat: Oropharynx is clear and moist. No oropharyngeal exudate.       TMs normal  Eyes: Conjunctivae and EOM are normal. Pupils are equal, round, and reactive to light.       Fundi benign  Neck: Normal range of motion. Neck supple. No thyromegaly present.  Cardiovascular: Normal rate, regular rhythm, normal heart sounds and intact distal pulses.  Exam reveals no gallop.   No murmur heard. Pulmonary/Chest: Effort normal and breath sounds normal. No respiratory distress. She has no wheezes. She has no rales.  Abdominal: Soft. She exhibits no mass. There is no tenderness.  Genitourinary:       Breasts with mild cystic changes--no worrisome lesions  Musculoskeletal: Normal range of motion. She exhibits no edema.  Lymphadenopathy:    She has no cervical adenopathy.    She has no axillary adenopathy.  Neurological: She is alert and oriented to person, place, and time. She exhibits normal muscle tone.       No weakness Normal gait  Skin: Skin is warm. No rash noted.       No suspicious lesions  Psychiatric: She has a normal mood and affect. Her behavior is normal. Judgment and thought content normal.          Assessment & Plan:

## 2010-07-22 ENCOUNTER — Other Ambulatory Visit: Payer: Self-pay | Admitting: *Deleted

## 2010-07-22 ENCOUNTER — Telehealth: Payer: Self-pay

## 2010-07-22 DIAGNOSIS — R109 Unspecified abdominal pain: Secondary | ICD-10-CM

## 2010-07-22 NOTE — Telephone Encounter (Signed)
GI Appt made with Dr Dorena Cookey on 07/28/2010 at 11:15am, patient notified. MK

## 2010-07-22 NOTE — Telephone Encounter (Signed)
Pt having stomach issues again and only wants to speak with [facility]. [phone].

## 2010-07-22 NOTE — Telephone Encounter (Signed)
Spoke with patient and she states this is the 3rd day that her stomach's been "acting" up and she would like to see a specialist if possible, she been having bowel movements and they've been like water. Please advise.

## 2010-07-22 NOTE — Telephone Encounter (Signed)
We can refer to GI in [place] I will send consult order

## 2010-07-22 NOTE — Telephone Encounter (Signed)
Okay 1 month of each in the usual amounts

## 2010-07-23 MED ORDER — ALPRAZOLAM 1 MG PO TABS: 1.0000 mg | ORAL_TABLET | Freq: Four times a day (QID) | ORAL | Status: DC

## 2010-07-23 MED ORDER — HYDROCODONE-ACETAMINOPHEN 5-325 MG PO TABS: 1.0000 | ORAL_TABLET | Freq: Three times a day (TID) | ORAL | Status: DC | PRN

## 2010-07-23 NOTE — Telephone Encounter (Signed)
rx called into pharmacy

## 2010-07-26 ENCOUNTER — Telehealth: Payer: Self-pay | Admitting: *Deleted

## 2010-07-26 NOTE — Telephone Encounter (Signed)
meds already refilled

## 2010-07-27 NOTE — Discharge Summary (Signed)
NAMEMAPLE, ODANIEL                ACCOUNT NO.:  [id number]   MEDICAL RECORD NO.:  [id number]          PATIENT TYPE:  OBV   LOCATION:  3004                         FACILITY:  [facility]   PHYSICIAN:  Lonia Blood, M.D.       DATE OF BIRTH:  [birth year]   DATE OF ADMISSION:  09/21/2008  DATE OF DISCHARGE:  09/22/2008                               DISCHARGE SUMMARY   PRIMARY CARE PHYSICIAN:  Karie Schwalbe, MD   DISCHARGE DIAGNOSES:  1. Transient left hand numbness, probable transient median nerve      entrapment syndrome - resolved.  2. Fibromyalgia.  3. Gastroesophageal reflux disease.  4. Depression.  5. History of peptic ulcer disease.  6. Migraine headaches.  7. Tobacco abuse.   DISCHARGE MEDICATIONS:  1. Prilosec 40 mg daily.  2. Norflex 100 mg twice a day.  3. Calcium and vitamin D daily.  4. Fioricet as needed for severe headaches.  5. Neurontin 2400 mg at bedtime.  6. Xanax 1 mg 4 times a day and 2 mg at bedtime.   CONDITION AT DISCHARGE:  Ms. Gibbins is discharged with stable vital signs,  afebrile and neurologically intact.  She will follow up with her primary  care physician within a week.   CONSULTATION:  No consultation obtained.   HISTORY AND PHYSICAL:  Refer to dictated H and P done by Dr.  Amador Cunas.   HOSPITAL COURSE:  Ms. Belli is a 60 year old woman with tobacco abuse,  presented to the emergency room with complaints of left hand numbness.  Her deficit was not in any obvious neurological distribution to suggest  a intracranial event, but it was more inconsistent with nerve entrapment  syndrome.  Nevertheless, as she has tobacco abuse, she was placed on  observation on telemetry and she had a MRI and MRA of her brain, which  were essentially  normal.  She also underwent carotid ultrasound, which was normal.  The  patient's numbness resolved by her hospital day #2 and she had no other  neurological symptoms.  She is discharged home, to continue her  medications and to follow up with her primary care physician.      Lonia Blood, M.D.  Electronically Signed     SL/MEDQ  D:  09/22/2008  T:  09/23/2008  Job:  [id number]   cc:   Karie Schwalbe, MD

## 2010-07-27 NOTE — H&P (Signed)
NAMEGLENETTA, KIGER                ACCOUNT NO.:  [id number]   MEDICAL RECORD NO.:  [id number]          PATIENT TYPE:  INP   LOCATION:  3004                         FACILITY:  [facility]   PHYSICIAN:  Gordy Savers, MDDATE OF BIRTH:  1951-01-18   DATE OF ADMISSION:  09/21/2008  DATE OF DISCHARGE:                              HISTORY & PHYSICAL   CHIEF COMPLAINT:  Numbness and pain, left arm and hand.   HISTORY OF PRESENT ILLNESS:  The patient is a 60 year old female who has  a prior history of dyslipidemia and ongoing tobacco use.  The patient  was stable when she awoke at 7:00 a.m. this morning and at that time,  noticed a pins and needles sensation involving her left hand.  Throughout the day, this sensation has persisted.  She has had  intermittently an achy type of discomfort involving the proximal forearm  region.  She denies any precipitating or aggravating factors.  Pain and  paresthesias are not influenced by movement of the hand, shoulder, or  neck region.  She denies any associated headache.  She denies any other  focal neurological symptoms.  Due to the persistent numbness involving  her left hand, she presented to the ED where head CT was normal.  Throughout the period of observation, her symptoms have persisted and  had been constant without much nor a variation.  She denies any motor  weakness.  The patient is now admitted for further evaluation and  treatment of her left hand paresthesias.   PAST MEDICAL HISTORY:  The patient has a history of chronic anxiety and  history of panic disorder.  She also has a history of major depression  and fibromyalgia.  There is remote history of anemia.  She has  gastroesophageal reflux disease and a prior history of documented  gastric ulcer.  She has dyslipidemia.  She has allergic rhinitis and a  history of migraine headaches.  Review of the records also reveals a  history of a B12 deficiency.   ALLERGIES:  She has multiple drug  allergies, these include the  following:  SULFA, BUSPAR, CODEINE, CELEXA, MORPHINE, CIPRO, SKELAXIN,  VERAPAMIL, PERCOCET, PROPULSID, CALAN, and LYRICA.   MEDICATIONS:  The present medical regimen includes the following:  1. Aspirin 81 mg daily.  2. Darvocet-N 100 one every 6 hours as needed for pain.  3. Fioricet 2 tablets upon the onset of headaches.  4. Neurontin 600 mg 4 capsules at bedtime.  5. Norflex 100 mg b.i.d.  6. Prilosec 40 mg daily.  7. Xanax 1 mg q.i.d.   SURGICAL PROCEDURES:  Prior appendectomy in 2005, cholecystectomy in  2004.  In 1986, she underwent TAH with BSO for endometriosis.  She has  had bunionectomy in the 90s.   FAMILY HISTORY:  Fairly noncontributory.  Father died in his 38s of an  MI.  Mother died in her 32s of an MI and also had cerebrovascular  disease.  She has 5 sisters, one deceased from an MI.  Positive for  hypertension, diabetes, and breast cancer.   SOCIAL HISTORY:  She is disabled, former  Psychiatric nurse.  One-half pack  per day smoker.  One daughter lives in [place], [place].   REVIEW OF SYSTEMS:  Exam is otherwise noncontributory.  She denies any  headache, visual disturbances, double vision, blurred vision.  ENT:  Negative for ear pain, hoarseness, or sinus pain.  CARDIOVASCULAR:  No  chest pain, shortness of breath, or history of cardiac disease.  RESPIRATORY:  Denies any chest pain, shortness of breath, or pleurisy.  GI:  No change in her bowel habits, history of melanoma, or abdominal  pain.  GU:  Negative for dysuria or hematuria.  MUSCULOSKELETAL:  History of fibromyalgia.  No recent clinical change.  SKIN:  No rash.  NEUROLOGIC:  Unremarkable except as mentioned in the history of present  illness.  She denies any associated confusion or any change in level of  her consciousness.  Denies any speech difficulty, gait instability,  dizziness, or near syncope.   PHYSICAL EXAMINATION:  VITAL SIGNS:  Blood pressure 110/70, pulse  rate  62, respiratory 16, and O2 saturation 99%.  GENERAL:  A well-developed, healthy-appearing female in no distress.  SKIN: Well tanned.  No rash.  HEENT:  Normal pupil responses.  Conjunctiva clear.  ENT normal.  Oropharynx benign.  NECK:  No bruits or adenopathy.  CHEST:  Clear.  CARDIOVASCULAR:  Normal S1 and S2.  Rate was slow and regular.  ABDOMEN:  Soft and nontender.  No bruits.  EXTREMITIES:  Full pedal pulses.  No edema.  NEUROLOGIC:  The patient alert and oriented with normal speech.  Motor  exam revealed no drift of the outstretched arm.  Grip strength appeared  normal.  Cranial nerves examination revealed normal pupil responses.  Extraocular muscles were full.  There is no facial asymmetry or facial  sensory loss.  Tongue and uvula were midline.  Shoulder shrug was  normal.  Finger-to-nose testing was intact.  Heel-to-shin testing was  also intact.  Sensory exam revealed some subjective decrease in  sensation involving the palm of the left hand, the dorsal aspect of the  left hand, as well as the fingers.  This did not extend proximal to the  wrist.  Cerebellar exam; normal finger-to-nose and heel-to-shin testing.  Reflexes were unremarkable.  Plantar reflexes were flexor.   IMPRESSION:  Left arm paresthesias.   ADDITIONAL DIAGNOSES:  1. History of fibromyalgia.  2. History of anxiety and depression.  3. Dyslipidemia.  4. Gastroesophageal reflux disease.   DISPOSITION:  The patient will be admitted for 24-hour observation.  The  patient will have a brain MRI and MRA.  Carotid artery Doppler studies  will also be performed.      Gordy Savers, MD  Electronically Signed     PFK/MEDQ  D:  09/21/2008  T:  09/22/2008  Job:  [id number]

## 2010-07-27 NOTE — Assessment & Plan Note (Signed)
The patient returns today after she last saw on October 03, 2006. We  trialed some Lyrica. She feels it made her confused and made it  difficult for her to sleep. Her main today is in the low back rather  than the neck area.   Also tried some Ultram to help her, and she decided she wanted to go  back to her Darvocet.   Activity wise, she is trying to walk. Does not do this always on a  regular basis.   REVIEW OF SYSTEMS:  Positive for dizziness, spasms, tingling, confusion,  depression, anxiety and poor appetite. Pain level is about 9/10 and  today more in the back and thighs.   EXAMINATION:  Blood pressure 108/69, pulse 79, respirations 18, O2  saturation 97% on room air.  GENERAL:  No acute distress. Her neck had mild tenderness to palpation.  She has good strength bilateral upper and lower extremities. Back has  tenderness at the lumbosacral junction along the thoracic, paraspinals  and parascapular muscular as well as around the trochanteric bursae  bilaterally.   IMPRESSION:  Fibromyalgia syndrome. I did review her cervical MRI which  was a major complaint from her last time, but at this visit really does  not feel like this is a big issue. Cervical MRI was really unremarkable,  some uncinate spurring but no other significant degenerative disk or  compressive lesions.   PLAN:  The patient overall feels most comfortable with her previous  treatment regimen which included Darvocet taking 1 to 2 per day. She  also takes Neurontin 600 mg 3 tablets at night - does not take any  during the day because of sedation. This does help her sleep at night  and is happy with this dosage.   I have written her for 45 Darvocet. I will see her back in about three  months. We will discuss other treatment options such as acupuncture, and  given that she does not have insurance benefits for this procedure, she  does not wish to pursue this at this time. We talked about physical  therapy in a more  formal sense. She has not really wanted to pursue this  either, but she stated she would try to do some regular walking every  day, 15 to 30 minutes.      Erick Colace, M.D.  Electronically Signed     AEK/MedQ  D:  10/31/2006 15:24:13  T:  11/01/2006 11:45:18  Job #:  [id number]   cc:   Karie Schwalbe, MD  [address] [place], [place] [address]

## 2010-07-27 NOTE — Group Therapy Note (Signed)
SUBJECTIVE:  Mrs. Verne scheduled as a new consultation; however, looking  back at her chart, she has been seen by me last on Aug 10, 2004. She is  a 60 year old female with a diagnosis of fibromyalgia syndrome. She was  initially referred to me when she was seeing Dr. Marisue Brooklyn as well  as Dr. Stacey Drain. She has had wide spread body pain since 1994,  diagnosis of fibromyalgia confirmed in 2005. She has been tried on  OxyContin, but weaned off of this. She used to be seen by psychiatry for  depression and anxiety. She has been weaned off of Seroquel and her  Xanax dose was reduced.   I recommended starting in physical therapy twice a week x3 weeks. She  made a followup appointment in March. Followup urine drug screen showed  no abnormalities and was consistent with her Hydrocodone dosage. We did  give her some samples of Lyrica on Aug 10, 2004, but then she did not  keep any other followup appointments. She does not recall how she did on  the Lyrica. She has been taking Darvocet, but she complains that this  makes her smoke more.   MEDICATIONS:  Include:  1. Alprazolam 1 mg q.i.d.  2. Gabapentin 600 mg; 2 to 3 tablets at night for approximately 3      tablets per day.  In addition she takes,  1. Hyoscyamine.  2. Prilosec.  3. Caltrate.  4. MiraLax.  5. Estradiol.   PAST SURGICAL HISTORY:  Abdominal surgery that she does not recall the  nature of back in 2004 at [facility].   Looking through e-chart, had a hospitalization for hypotension with a  normal CT head. Xanax was reduced from 2 mg b.i.d. to 0.5 b.i.d., had a  hospitalization for gastric ulcers in April 2007. She had appendectomy  thecal tip resected March 17, 2004.   PHYSICAL EXAMINATION:  VITAL SIGNS:  Blood pressure:  114/59. Pulse:  74. Respiration:  17. O2 sat 95% on room air.  GENERAL:  No acute distress.  MOOD AND AFFECT:  Mildly anxious, but otherwise appropriate.  GAIT:  Normal.  MUSCULOSKELETAL:   She has tenderness to bilateral upper traps, bilateral  external costal, bilateral elbow, left knee, bilateral hip, and  bilateral low back.   Her range of motion of upper and lower extremities is normal. Her lumbar  spine range of motion is normal. Her neck range of motion is 75% forward  flexion, extension, rotation, and bending.   IMPRESSION:  1. Fibromyalgia syndrome. Given her complaints of Darvocet making her      smoke more and her half pack per day smoking habit, would feel that      changing to Ultram 50 t.i.d. may benefit her in more than 1 way.  2. We will also reinstitute trial of Lyrica 75 b.i.d. given that she      never followed through on the trial before.  3. I will see her back in one month. We will repeat urine drug screen.      We will eventually need to go up to 450 mg as a proper trial for      Lyrica depending on side-effects.      Erick Colace, M.D.  Electronically Signed     AEK/MedQ  D:  10/03/2006 16:59:26  T:  10/04/2006 10:28:10  Job #:  [id number]

## 2010-07-28 ENCOUNTER — Other Ambulatory Visit: Payer: Self-pay | Admitting: *Deleted

## 2010-07-28 MED ORDER — BUTALBITAL-APAP-CAFF-COD 50-325-40-30 MG PO CAPS: 2.0000 | ORAL_CAPSULE | Freq: Four times a day (QID) | ORAL | Status: DC | PRN

## 2010-07-28 MED ORDER — BUTALBITAL-APAP-CAFFEINE 50-325-40 MG PO TABS: ORAL_TABLET | ORAL | Status: DC

## 2010-07-28 NOTE — Telephone Encounter (Signed)
Okay #30 x 0 

## 2010-07-28 NOTE — Telephone Encounter (Signed)
rx called into pharmacy

## 2010-07-30 NOTE — Discharge Summary (Signed)
NAMEELLINOR, TEST                ACCOUNT NO.:  [id number]   MEDICAL RECORD NO.:  [id number]          PATIENT TYPE:  INP   LOCATION:  1411                         FACILITY:  [facility]   PHYSICIAN:  Lonia Blood, M.D.       DATE OF BIRTH:  [birth year]   DATE OF ADMISSION:  06/16/2005  DATE OF DISCHARGE:  06/18/2005                                 DISCHARGE SUMMARY   DISCHARGE DIAGNOSES:  1.  Gastric ulcers.  2.  Anemia of iron deficiency.  3.  Fibromyalgia.  4.  Status post resection of the cecal tip and appendix in January 2006.  5.  Hyperlipidemia.  6.  Chronic fatigue syndrome.  7.  Chronic weakness.  8.  Chronic anxiety.  9.  Osteoarthritis.  10. Status post cholecystectomy in March 2004.  11. Status post hysterectomy.  12. Status post temporomandibular joint surgery.   DISCHARGE MEDICATIONS:  1.  Nexium 40 mg by mouth twice a day.  2.  Estradiol 2 mg daily.  3.  Alprazolam 2 mg 1 tablet at bedtime and 1 tablet daily as needed.  4.  Phenergan 25 mg 1 tablet every 4 to 6 hours as needed for nausea.  5.  TriCor 145 mg at bedtime.  6.  Neurontin 600 mg twice a day.  7.  Zocor 40 mg at bedtime.  8.  Vicodin 5/500 one every 4 hours as needed for pain.  9.  Iron sulfate 1 tablet by mouth daily.   CONDITION ON DISCHARGE:  Mrs. Naval was discharged in good condition.  At the  time of discharge, she was tolerating a regular diet. She was able to  ambulate without any difficulties. She was instructed to follow up with Dr.  Madilyn Fireman from gastroenterology and with primary care physician [place],  [place], Dr. Quillian Quince.   HISTORY AND PHYSICAL:  For complete History and Physical, please see H&P  done by Dr. Sherrie Mustache on June 16, 2005.   PROCEDURES:  Mrs. Crumbley underwent transfusion of 2 units of packed red blood  cells.   CONSULTATIONS:  The patient was seen in consultation by Dr. Ewing Schlein from  gastroenterology.   HOSPITAL COURSE:  #1.  GENERALIZED WEAKNESS AND ANEMIA:  Mrs. Reder was  admitted with diagnosis  of weakness most likely secondary to chronic iron deficiency anemia. The  patient's hemoglobin on admission was 8.7, and she was transfused 2 units of  packed red blood cells, and her hemoglobin level became 10.3.  Her weakness  improved significantly.  In retrospect, it was felt that Mrs. Donahey's  weakness was secondary to iron-deficiency anemia or chronic GI loss.  She  recently just had an endoscopy done by Dr. Madilyn Fireman.  This revealed ulcers  secondary to nonsteroidal anti-inflammatory agents. Because of that, Mrs.  Leeth was instructed not to take any nonsteroidals, aspirin or Mobic, and to  follow up with her primary care physician.  Of note, Mrs. Gieselman did not have  any acute gastrointestinal blood loss while in the hospital.  Her fecal  occult blood test came back negative, and she never vomited blood.  Lonia Blood, M.D.  Electronically Signed     SL/MEDQ  D:  06/18/2005  T:  06/19/2005  Job:  [id number]   cc:   Everardo All. Madilyn Fireman, M.D.  Fax: [phone]   Dr. Quillian Quince

## 2010-07-30 NOTE — Assessment & Plan Note (Signed)
DATE OF BIRTH:  Jun 11, 1950   MEDICAL RECORD NUMBER:  [id number]   Lindsey Ward is a 60 year old lady with fibromyalgia syndrome. She returns today  after initial consultation approximately one month ago. Since that time, she  has been taking hydrocodone 5/500, 1 p.o. q.d. in addition to aspirin 81 mg  p.o. q.d., Xanax 1 mg p.o. q.12 which is prescribed by her primary doctor,  Neurontin 600 p.o. t.i.d.   She has had no new problems. She has started physical therapy, just one  session thus far. Her functional status has remained the same. She is able  to do all her activities of daily living and all her yard work.   She is divorced, lives with her friend, smokes, retired since 2003.   REVIEW OF SYMPTOMS:  Positive for anxiety, depression and poor sleep. States  that the Xanax helps her sleep. She has constipation, no urinary symptoms.   PHYSICAL EXAMINATION:  GENERAL:  No acute distress. Mood and affect  appropriate.  BACK:  Has tenderness which is mild along the paraspinal's throughout the  thoracic spine. She has good spine range of motion. She has normal gait, she  has normal mood and affect.   IMPRESSION:  Fibromyalgia syndrome functioning well on minimal narcotic  analgesics. Her UDS showed no evidence of illicit drug use or nonreported  narcotic use. I will see her back in one month to monitor her therapy  progress. We may consider increasing her hydrocodone to twice a day but  overall keeping her on smaller dosages in general. Consider switch from  Neurontin to Lyrica, this would be more on an empiric basis. She really does  not have any side effects from the Neurontin.  Overall is a bit cloudy  headed, takes a long time to fill out her forms. Could be related to her  Xanax plus Neurontin together. As I noted to the patient, it would be good  for her to get off the Xanax. She can try something else for sleep but she  is reluctant to do this.  One sleep medication may be Rozerem.   Lyrica can  be helpful for anxiety.      AEK/MedQ  D:  05/18/2004 11:52:00  T:  05/18/2004 13:18:21  Job #:  [id number]   cc:   Lovenia Kim, D.O.  [address], [address]  [place]  [place] [address]  Fax: [phone]   Aundra Dubin, M.D.

## 2010-07-30 NOTE — Op Note (Signed)
[name], Lindsey Ward                ACCOUNT NO.:  [id number]   MEDICAL RECORD NO.:  [id number]          PATIENT TYPE:  AMB   LOCATION:  ENDO                         FACILITY:  [facility]   PHYSICIAN:  John C. Madilyn Fireman, M.D.    DATE OF BIRTH:  1950-05-27   DATE OF PROCEDURE:  DATE OF DISCHARGE:                                 OPERATIVE REPORT   PROCEDURE:  Colonoscopy.   INDICATIONS FOR PROCEDURE:  Right lower quadrant abdominal pain with  suggestion of calcified 2.9 cm cecal mass on recent small bowel series and  KUB.   PROCEDURE:  The patient was placed in the left lateral decubitus position  and placed on the pulse monitor with continuous low flow oxygen delivered by  nasal cannula.  She was sedated with 200 mcg IV fentanyl and 16 mg IV  Versed.  The Olympus video colonoscope was inserted into the rectum and  advanced toward the cecum, confirmed by transillumination at McBurney's  point and intubation of the terminal ileum.  However, the base of the cecum  was somewhat distorted, and there appeared to be a small compartment of the  base of the cecum that I could not access with the scope.  It is possible  that this area could have been retaining stool or contained a small mass,  but I could not be certain and could not directly visualize this area due to  somewhat distorted cecal anatomy.  Otherwise, the mucosa of the terminal  ileum and cecum appeared normal, as did the ascending, transverse,  descending, sigmoid, and rectum, with no masses, polyps, diverticula, or  other mucosal abnormalities.  The scope was then withdrawn and the patient  returned to the recovery room in stable condition.  She tolerated the  procedure well, and there were no immediate complications.   IMPRESSION:  Normal colonoscopy, including the terminal ileum, with some  possible limitation of view of the cecum.   PLAN:  Will follow up study with an abdominal CT scan to see if the mass  seen on KUB may possibly  reside outside the colon.      JCH/MEDQ  D:  01/06/2004  T:  01/06/2004  Job:  [id number]   cc:   Lovenia Kim, D.O.  [name], [address]  [place]  [place] [address]  Fax: [phone]

## 2010-07-30 NOTE — Discharge Summary (Signed)
NAMENIQUITA, DIGIOIA                ACCOUNT NO.:  [id number]   MEDICAL RECORD NO.:  [id number]          PATIENT TYPE:  INP   LOCATION:  0381                         FACILITY:  [facility]   PHYSICIAN:  Angelia Mould. Derrell Lolling, M.D.DATE OF BIRTH:  [birth year]   DATE OF ADMISSION:  03/17/2004  DATE OF DISCHARGE:  03/20/2004                                 DISCHARGE SUMMARY   OPERATION PERFORMED:  Diagnostic laparoscopy, laparoscopic resection of the  cecal tip and appendix.   FINAL DIAGNOSIS:  Fecalith of the cecal tip.   HISTORY:  This is a 60 year old white female, who had some complaints of  abdominal pain, back pain, abdominal swelling, and constipation back in  April or May 2005.  She was treated for pneumonia and fecal impaction.  She  got better.  She came to see Dr. Dorena Cookey in October 2005, with new  complaints of right lower quadrant pain and tenderness spreading around to  the left lower quadrant.  No nausea or vomiting.  The pain came and went.  She had an extensive work-up including a small-bowel follow-through which  showed a normal small bowel but a 2.9 cm rim enhanced calcified mass in the  cecum.  Radiologist stated that there were rare, unusual lesions that can  present as calcified masses.  Colonoscopy was performed by Dr. Dorena Cookey on  January 06, 2004.  He got all the way to the ileocecal valve and was able to  get into the terminal ileum, but the base of the cecum was distorted, and he  stated that there appeared to be a small compartment at the base of the  cecum that he could not access with the scope, and he did not see the  appendiceal orifice nor did he see a tumor or ulcer.  Subsequent CT scan  performed on January 08, 2004, showed a soft tissue mass in the medial  cecum, but this was nonspecific.  Cecal lesion could not be excluded.  The  patient was seen in my office on January 30, 2004.  There were  abnormalities on exam except for some subjective tenderness in  the lower  abdomen but basically unremarkable.  She was advised to undergo diagnostic  laparoscopy and possible laparoscopic right colectomy for mass in the cecum  of uncertain etiology.   HOSPITAL COURSE:  On the day of admission, the patient was taken to the  operating room.  We did a diagnostic laparoscopy, saw no abnormalities.  We  mobilized the right colon and brought it up through a small extraction port  site, and we could palpate a rock-hard mass in the tip of the cecum.  At  this point, we felt that this might be something such as a benign fecalith,  and we simply used a stapling device to resect the tip of the cecum, which  included the appendix, but preserved the ileocecal valve.  Specimen was  opened and, in fact, there was a large, multifaceted fecalith within a  trapped compartment in the cecal tip.  Pathologically there was nothing  really wrong with the cecum, and  the pathologist felt that this was also  fecalith.  We felt that nothing further needed to be done, as this was a  benign process.  We did not perform a right colectomy.  The wounds were  closed.   Postoperatively, the patient did reasonably well.  One postop day one, she  seemed a little bit overly sedated, and we cut back on her narcotic and IV  fluids, and she did fairly well.  We advanced her diet and activities.  Her  potassium was checked at 3.5.  She was discharged on March 20, 2004,  tolerating a diet and having bowel movements.  She is informed as to her  benign diagnosis.  She was asked to follow up with me in the office in 2-3  weeks.    HMI/MEDQ  D:  04/08/2004  T:  04/08/2004  Job:  [id number]

## 2010-07-30 NOTE — Discharge Summary (Signed)
[place]  Patient:    Lindsey Ward, Lindsey Ward                       MRN: [id number] Adm. Date:  [id number] Disc. Date: 06/21/00 Attending:  Daisey Must Dictator:   Joellyn Rued, P.A.C. CC:         Daisey Must, M.D. [facility]   Referring Physician Discharge [facility]  DATE OF BIRTH:  1950-12-02  SUMMARY OF HISTORY:  Lindsey Ward is a 60 year old white female who presented with sharp back pain across T11/T12 associated with diaphoresis and shortness of breath.  She is unable to describe her symptoms other than that.  She stated this has been going on for months.  Patient was awakened evidently on the morning of admission with the above symptoms.  It went away in less than a minute.  She took a baby aspirin and her physician told her to go to the emergency room.  She does have a history of hyperlipidemia, fibromyalgia, anemia, migraines, and tobacco use.  LABORATORIES:  Admission H&H 13.4 and 37.8, normal indices, platelets 376,000, WBC 8.3.  Sodium 138, potassium 4.1, BUN 21, creatinine 1.0.  CKs and troponins were negative for myocardial infarction.  Chest x-ray did not show any active disease.  CT lower extremities did not show any DVT.  There is no evidence of pulmonary embolism.  There was a right breast mass probably representative of a complicated cyst suggested correlation with mammography and right breast ultrasound.  EKG showed normal sinus rhythm, nonspecific ST-T wave changes.  HOSPITAL COURSE:  Lindsey Ward was admitted to 5100.  Overnight she complained of cough and sore throat.  Enzymes and EKGs were negative for myocardial infarction.  Lipids were performed on the morning of the 10th, however, pending at the time of this dictation.  On April 10 she underwent cardiac catheterization by Dr. Chales Abrahams.  According to the progress notes her ejection fraction was greater than 55%.  She had mildly irregular ______ in the LAD, the circumflex, and the 20%  proximal RCA lesion.  It was felt that her chest discomfort was not cardiac, to consider esophageal spasm and a possible trial of nitrates as an outpatient.  Initially he felt that she should be placed on H2 blockers for six weeks.  ______ bed rest.  She was doing well, ambulating without difficulty.  Catheterization site was intact.  She was discharged home on the evening of April 10 with the diagnosis of chest discomfort of undetermined etiology.  DISPOSITION:  She is discharged home and asked to continue her home medications.  She is instructed to decrease her Norvasc to 5 mg q.d. for one week and then discontinue.  She was given a new prescription for Prilosec 20 mg b.i.d. and asked to continue her other medications as previously.  She was advised no lifting, driving, sex, activity, or heavy exertion x 2 days.  Given permission to return to work on Monday.  Maintain low salt, fat, cholesterol diet.  If she has any problems with her catheterization site she was asked to call us immediately.  She is advised no smoking or tobacco products.  She will see the PA in our office on Thursday, April 25 at 10 a.m. and asked to arrange followup with Dr. ______ in four to six weeks.  She should also have a mammogram to follow-up on the right breast mass. DD:  06/21/00 TD:  06/21/00 Job: [id number]

## 2010-07-30 NOTE — Op Note (Signed)
NAME:  Lindsey Ward, Lindsey Ward                          ACCOUNT NO.:  [id number]   MEDICAL RECORD NO.:  [id number]                   PATIENT TYPE:  OIB   LOCATION:  2899                                 FACILITY:  [facility]   PHYSICIAN:  Angelia Mould. Derrell Lolling, M.D.             DATE OF BIRTH:  [birth year]   DATE OF PROCEDURE:  05/22/2002  DATE OF DISCHARGE:                                 OPERATIVE REPORT   PREOPERATIVE DIAGNOSIS:  Chronic cholecystitis with cholelithiasis.   POSTOPERATIVE DIAGNOSIS:  Chronic cholecystitis with cholelithiasis.   OPERATION PERFORMED:  Laparoscopic cholecystectomy with intraoperative  cholangiogram.   SURGEON:  Angelia Mould. Derrell Lolling, M.D.   FIRST ASSISTANT:  Currie Paris, M.D.   OPERATIVE INDICATIONS:  This is a 60 year old white female who has a history  of fibromyalgia and chronic fatigue syndrome, anxiety and depression, and  gastroesophageal reflux disease.  She presented recently with a two to three-  week history of intermittent episodes of upper abdominal pain and right  upper quadrant pain radiating to the back.  She reports fatty food  intolerance.  This was occurring daily.  She had a gallbladder ultrasound  which showed gallstones but otherwise was unremarkable.  She is brought to  the operating room electively for cholecystectomy.   OPERATIVE FINDINGS:  The gallbladder was somewhat chronically inflamed,  discolored, slightly thick-walled, there were some adhesions to the body and  infundibulum, and tethering of the duodenum to the gallbladder.  There were  no other abnormalities noted.  The cholangiogram showed no filling defects,  no obstruction, and normal biliary anatomy.  The liver looked healthy.  The  stomach, duodenum, small intestines, and large intestines were grossly  normal to inspection.   OPERATIVE TECHNIQUE:  Following the induction of general endotracheal  anesthesia, the patient's abdomen was prepped and draped in a sterile  fashion.  Marcaine 0.5% with epinephrine was used as a local infiltration  anesthetic.   A vertically oriented incision was made inside the lower rim of the  umbilicus through an apparent previous scar.  The fascia was incised in the  midline and the abdominal cavity entered under direct vision.  A 10-mm  Hasson trocar was inserted and secured with a pursestring suture of 0  Vicryl.  Pneumoperitoneum was created.  The video camera was inserted, with  visualization and findings as described above.  A 10-mm trocar was placed in  the subxiphoid region, and two 5-mm trocars were placed in the right mid-  abdomen.  The gallbladder was placed under traction.  Adhesions were taken  down very carefully, but those adhesions that were close to the duodenum  were simply taken down with cold scissors dissection.  We dissected out the  cystic duct and the cystic artery.  The cystic artery was isolated as it  went onto the gallbladder wall.  It was secured with metal clips and  divided.  The cystic  duct was isolated.  A large window was created behind  the cystic duct.  A metal clip was placed on the cystic duct close to the  gallbladder.  The cystic duct was opened, and a cholangiogram catheter was  inserted.  A cholangiogram was obtained using the C-arm.  The cholangiogram  showed normal intrahepatic and extrahepatic biliary anatomy, no filling  defects, and prompt flow of contrast into the duodenum.  The cholangiogram  catheter was then removed, and the cystic duct was secured with more metal  clips and divided.  The gallbladder was dissected from its bed with  electrocautery and removed through the umbilical port.  There was no  spillage of bile.  The subfatty space and subphrenic spaces were irrigated  copiously with saline.  Hemostasis was excellent, being achieved with  electrocautery.  The irrigation fluid was completely clear at the completion  of the case.  There was no bleeding and no bile  leak whatsoever.  The  trocars were removed under direct vision, and there was no bleeding from the  trocar sites.  The pneumoperitoneum was released.  The fascia at the  umbilicus was closed with 0 Vicryl sutures.  The skin incisions were  irrigated with saline and the skin closed with subcuticular sutures of 4-0  Vicryl and Steri-Strips.  Clean bandages were placed.   The patient was taken to the recovery room in stable condition.  Estimated  blood loss was about 25 cc.  There were no complications.  Sponge, needle,  and instrument counts were correct.                                               Angelia Mould. Derrell Lolling, M.D.    HMI/MEDQ  D:  05/22/2002  T:  05/22/2002  Job:  [id number]   cc:   Lovenia Kim, D.O.  [address]  [place]  [place] [address]  Fax: [phone]

## 2010-07-30 NOTE — H&P (Signed)
Lindsey Ward, Lindsey Ward                ACCOUNT NO.:  [id number]   MEDICAL RECORD NO.:  [id number]          PATIENT TYPE:  INP   LOCATION:  4729                         FACILITY:  [facility]   PHYSICIAN:  Broadus John T. Pickard II, MDDATE OF BIRTH:  [birth year]   DATE OF ADMISSION:  11/01/2005  DATE OF DISCHARGE:                                HISTORY & PHYSICAL   CHIEF COMPLAINT:  Shock, dizziness, confusion, slurred speech.   HISTORY OF PRESENT ILLNESS:  The patient is a 60 year old white female who  beginning yesterday became nauseated and vomited x1.  Afterwards she felt  extremely dizzy and confused.  She was stumbling into walls.  She said she  felt like she was going to pass out.  She never did loose consciousness.  She was having slurred speech and numb upper lip.  This lasted greater than  24 hours.  It was resolving in the [facility] emergency room.  There, she was found to be hypotensive with 81 systolic blood pressure.  With IV fluids, blood pressure increased to 96 systolic and the patient's  symptoms improved.  Of note with the nausea yesterday, the patient was  taking several Phenergan in addition to her Xanax, Neurontin, and Darvocet.   REVIEW OF SYSTEMS:  Negative for any fevers, chills, cough, ear pain, sore  throat.  Positive for nausea.  Positive for vomiting.  Negative for  diarrhea.  Negative for dysuria.  Negative for chest pain.   PAST MEDICAL HISTORY:  1. Gastric ulcer, status post EGD in April 2007.  2. Anemia, iron deficiency.  3. Fibromyalgia.  4. Chronic fatigue syndrome.  5. Chronic weakness.  6. Status post resection of the cecal tip and appendix in January 2006.  7. Hyperlipidemia.  8. Chronic anxiety.  9. Osteoarthritis.  10.On admission in April 2007, her blood pressure was 89 to 96 systolic      over 35 to 62 diastolic.   PRIMARY CARE PHYSICIAN:  Dr. Trinna Post who she has seen for the last few  months.   PAST SURGICAL HISTORY:  1. Resection  of the cecal tip and appendix in January 2006.  2. Cholecystectomy in March 2004.  3. Hysterectomy.  4. TMJ surgery.   MEDICATIONS:  1. Nexium 40 mg p.o. b.i.d.  2. Estradiol 2 mg p.o. every day.  3. Alprazolam 2 mg, one p.o. q.h.s. one p.o. every day p.r.n.  4. Phenergan p.r.n.  5. TriCor 145 mg p.o. q.h.s.  6. Neurontin 600 mg p.o. b.i.d.  7. Zocor 40 mg p.o. q.h.s.  8. Iron sulfate 325 mg p.o. every day.  9. Darvocet p.r.n.   ALLERGIES:  1. SULFA.  2. CODEINE.  3. BUSPAR.  4. CELEXA.  5. RESTORIL.   SOCIAL HISTORY:  The patient is divorced.  She lives in [place] with  her boyfriend.  She has 1 daughter.  On disability for her fibromyalgia and  chronic fatigue syndrome.  She drinks occasional alcohol.  No illicit drugs.  Smokes 1/2 pack per day for 40+ years.   PHYSICAL EXAMINATION:  VITAL SIGNS:  Noted.  GENERAL:  No apparent distress.  Alert and oriented x3.  HEENT:  Atraumatic, normocephalic.  Pupils are equal, round, and reactive to  light.  Extraocular movements are intact.  TMs and canals are clear.  There  is no erythema, no exudate in the posterior oropharynx.  No lymphadenopathy  of the neck.  No thyromegaly.  No JVD.  No bruit.  CARDIOVASCULAR:  Bradycardic with no murmurs, rubs, or gallops.  PULMONARY:  Clear to auscultation bilaterally.  No wheezes, crackles, or  rales.  ABDOMEN:  Soft, nontender, nondistended.  Positive bowel sounds.  EXTREMITIES:  No cyanosis, clubbing, or edema.  NEUROLOGIC:  Cranial nerves II-XII grossly intact.  Muscle strength 5/5  equal, symmetric in bilateral upper and lower extremities.  She has normal  reflexes bilaterally at the biceps as well as patella.  She has normal  cerebellar exam with normal point-to-point, nose-to-finger, alternating hand  movements, and heel-to-shin test.  She has a normal Romberg.   LABORATORY:  White count is 9, hemoglobin is 12.2, hematocrit 36.0, platelet  count 309.  PT is 15.2, INR is 1.2,  PTT is 25.  Sodium is 141, potassium is  4.1, chloride is 106, bicarb is 29, BUN is 13, creatinine is 1.2, glucose is  114.  T-bili is 0.7, alk phos is 45, AST 30, ALT 19, lipase 26.  CK-MB is  1.1, troponin 0.05, myoglobin is 75.6.  Urinalysis:  Specific gravity of  1.018, otherwise within normal limits.   RADIOGRAPHIC STUDIES:  Head CT was normal.   ASSESSMENT/PLAN:  A 60 year old white female with greater than 24 hours of  slurred speech, dizziness, and presyncopal symptoms.   PROBLEMS:  1. Neurologic deficits.  Differential diagnoses include medication      overdose and side effects, versus transient ischemic attack, versus      decreased cerebral perfusion with transient neurologic impairment      secondary to low blood pressure.  It is resolved now.  As far as workup      for transient ischemic attack, her 2-day stroke risk score is 2 which      means she has a less than 1% chance of stroke.  One her age is less      than 60.  She had no hypertension with the event.  She had an isolated      speech disturbance, but she did get 2 points for duration of symptoms.      We will get carotid Dopplers, check an EKG.  I feel that this is      sufficient at this point.  We will hold on aspirin or antiplatelet      agents as she has a history of ulcers, status post EGD in April, and is      a contra indication for that.  2. As far as medication interactions.  We will hold her Darvocet, decrease      her Neurontin to 300 b.i.d.  We will hold her Phenergan and decrease      her Xanax and monitor her symptoms.  I doubt a basilar transient      ischemic attack given the fact there was a speech disturbance which      would indicate left temporal cerebral perfusion deficits, even with her      ataxia and stumbling.  Given the multiple locations of neurologic      deficit, this indicates gross cerebral hypoperfusion rather than a     basilar transient ischemic attack and therefore, I  do not think  an MRI      would add much at this time.  Plus her cerebellar exam is completely      normal.  3. As far as the syncope being a possible cause.  This is not true      syncope.  If anything it is presyncope.  We will watch the patient on      telemetry overnight.  There is no indication for an echocardiogram at      present as she does not have a murmur consistent with aortic stenosis.      We will check an EKG.  4. Low blood pressure.  The patient's baseline blood pressure is 89 to 96      systolic.  She is close to her baseline.  We will check orthostatic      blood pressures.  The urinalysis is negative for any signs of      infection.  There is no white count.  Her exam is normal and there is      no review of systems significant with any evidence of an infection.      Her creatinine of 1.2 in a small, thin, frail elderly female could be      partially elevated.  We will bolus her 1 liter of normal saline and      then begin maintenance intravenous fluids vigorously throughout the      night and monitor her blood pressure to see if there is symptomatic      improvement.  5. Fluids, electrolytes, nutrition.  We will continue intravenous fluids      and a regular diet.  6. Chronic pain, chronic fatigue syndrome.  As stated above, we will limit      some of her centrally acting medications to see if this helps her      symptoms.  This will be a 23-hour observation admission.      Broadus John T. Pamalee Leyden, MD     WTP/MEDQ  D:  11/01/2005  T:  11/01/2005  Job:  [id number]

## 2010-07-30 NOTE — Cardiovascular Report (Signed)
[facility]  Patient:    Lindsey Ward, Lindsey Ward                       MRN: [id number] Proc. Date: 06/21/00 Adm. Date:  [id number] Attending:  Daisey Must CC:         Marinus Maw, M.D.  Daisey Must, M.D. [facility]   Cardiac Catheterization  PROCEDURES PERFORMED: 1. Left heart catheterization. 2. Left ventriculogram. 3. Selective coronary angiography.  DIAGNOSES: 1. Trivial coronary artery disease by angiogram. 2. Normal left ventricular systolic function.  INDICATIONS:  [name] is a 60 year old, white female, with multiple cardiac risk factors who presents with severe substernal chest discomfort.  The patient has previously undergone stress test which has showed no significant ischemia.  However, the patient presents now with increasing pain and on presentation had severe 10/10 substernal chest discomfort with mild shortness of breath.  The patient was admitted to the hospital and subsequently ruled out for acute myocardial infarction.  Spiral CT of the chest was negative and after discussing further options with the patient and her husband, we elected to proceed with cardiac catheterization to definitively rule out coronary artery disease.  TECHNIQUE:  After informed consent was obtained, the patient was brought to the cardiac catheterization lab, a 6 French sheath was placed in the right femoral artery.  Selective angiography and left heart catheterization were then performed in the usual fashion.  At the termination of the case, the catheters and sheath were removed, manual pressure applied until adequate hemostasis was achieved.  The patient tolerated the procedure well and was transferred to the ward in stable condition.  FINDINGS:  Findings are as follows: 1. Left main trunk:  The left main trunk is angiographically normal. 2. LAD:  This is a medium caliber vessel that provides a large first    diagonal branch in the proximal  segment.  The LAD system has mild    irregularities. 3. Left circumflex artery:  Medium caliber vessel that provides a large    first marginal branch in the mid section and two trivial marginal branches    distally.  This vessel has mild irregularities. 4. Right coronary artery:  The right coronary artery is dominant.  This is a    medium caliber vessel that provides a small posterior descending artery in    its terminal segment and a trivial posterior ventricular branch.  The    right coronary artery has mild irregularities of 20% in the proximal and    mid section.  LEFT VENTRICULOGRAM:  Normal end-systolic and end-diastolic dimensions. Overall left ventricular function is well preserved, ejection fraction of greater than 55%.  LV pressure is 110/0, aortic is 110/60, LVEDP equals 12.  ASSESSMENT AND PLAN:  [name] is a 60 year old female with trivial coronary artery disease and well preserved left ventricular function by cardiac catheterization.  At this point, continued risk factor modification will be pursued.  The patient has a history of esophageal reflux disease and possible esophageal spasm and further attention to this may be warranted with a trial of acid suppression and perhaps nitrates in lieu of the calcium channel blockers which do not appear to be controlling her symptoms of esophageal spasm. DD:  06/21/00 TD:  06/21/00 Job: [id number]

## 2010-07-30 NOTE — Op Note (Signed)
Lindsey Ward, Lindsey Ward                ACCOUNT NO.:  [id number]   MEDICAL RECORD NO.:  [id number]          PATIENT TYPE:  INP   LOCATION:  X004                         [facility]ACILITY:  [facility]   PHYSICIAN:  Angelia Mould. Derrell Lolling, M.D.DATE OF BIRTH:  [birth year]   DATE OF PROCEDURE:  03/17/2004  DATE OF DISCHARGE:                                 OPERATIVE REPORT   PREOPERATIVE DIAGNOSIS:  Calcified mass of the cecum.   POSTOPERATIVE DIAGNOSIS:  Suspect fecalith of the cecum.   OPERATION PERFORMED:  Laparoscopic resection of cecal tip and appendix.   SURGEON:  Angelia Mould. Derrell Lolling, M.D.   ASSISTANT:  Vikki Ports, MD   ANESTHESIA:  General.   INDICATIONS FOR PROCEDURE:  This is a 60 year old white female who underwent  a cholecystectomy in 2004 with an uneventful recovery.  She has had some  complaints of lower abdominal pain, abdominal swelling and constipation.  She has had a fairly extensive work-up.  Small bowel series was unremarkable  except there was a ring enhancing lesion in the right lower quadrant.  This  was actually present in April of 2005 on review of prior films.  This was  discussed with radiology and they felt that there were some very rare,  unusual lesions that presented as calcified masses in the cecum.  Colonoscopy by Dr. Dorena Cookey was performed and he did get all the way  through the ileocecal valve into the terminal ileum.  He stated that the  base of the cecum was distorted and that there appeared to be a small  compartment at the base of the cecum that he could not access but he did not  see any mucosal lesion.  A CT scan was performed and the appendix looked  normal, but there was apparently a soft tissue mass of the medial cecum  which could not be excluded.  The patient was sent to me for evaluation.  I  was uncertain of the diagnosis but felt that since there was an abnormality  on plain films, CT and colonoscopy that the submucosal mass such as lipoma  or  leiomyosarcoma or a lesion of the base of the appendix needed to be  excluded and that surgical resection was the only way to do this. She was  counseled regarding laparoscopic right colectomy and consented to that.  She  has undergone a bowel prep and was brought to the hospital electively.   OPERATIVE FINDINGS:  General laparoscopy was unremarkable.  The gallbladder  was surgically absent.  The stomach and duodenum looked normal.  There were  a few adhesions in the left lower quadrant to the sigmoid colon of uncertain  significance.  The terminal ileum, appendix and cecum were visually normal.  After I had mobilized the entire right colon and brought it up through the  extraction site, I could palpate what appeared to be about a 2.5 cm  multifaceted rock hard mass in the tip of the cecum.  I tried to completely  mobilize this and I could not move it out of the cecum.  Dr. Luan Pulling and  I  discussed management strategies and felt that this felt most likely to be  a benign process such as a gallstone or a fecalith.  We then elected to  simply transect the cecal tip which included the base of the appendix and  the appendix. he ileocecal valve was not compromised. We opened the specimen  on the side table and found that there was a dark black-green multifaceted  mass consistent with either a gallstone or a fecalith.  There was no mucosal  abnormality of the cecal tip or the appendix.   DESCRIPTION OF PROCEDURE:  Following the induction of general endotracheal  anesthesia, Foley catheter and orogastric tube were placed.  The abdomen was  prepped and draped in sterile fashion.  0.5% Marcaine with epinephrine was  used as a local infiltration anesthetic.  A short transverse incision was  made at the superior rim of the umbilicus.  The fascia was incised  transversely.  The abdominal cavity was entered  under direct vision. A 10  mm Hasson trocar was inserted and secured with a pursestring  suture of 0  Vicryl.  Pneumoperitoneum was created.  A 10 mm trocar was placed in the  suprapubic area, a 10 mm trocar placed in the left midabdomen just at the  lateral edge of the rectus sheath and a 5 mm trocar placed in the right  lower quadrant.   We examined the abdomen and pelvis with the findings as described above.  With traction and counter traction and use of the Harmonic scalpel, we  mobilized the terminal ileum, the right colon and the hepatic flexure.  We  divided the lateral peritoneal attachments with the Harmonic scalpel and  gently mobilized the right colon to the midline.  We took down the hepatic  flexure also using the Harmonic scalpel.  We identified the first and second  and beginning of the third portion of the duodenum and separated this very  gently from the hepatic flexure mesentery.  We divided the mesoappendix  using the Harmonic scalpel.  This was completely  hemostatic.  At this point  we had excellent mobilization of the right colon and terminal ileum.  The  pneumoperitoneum was released.  A 6 to 7 cm transverse incision was made  just above the umbilicus and extending to the right, incorporating the  trocar site.  The fascia and muscle layers were divided with electrocautery.  Hemostasis was excellent.  A protractor was placed to protect the wound.  The right colon was delivered through this wound and I had excellent  mobilization.  At this point I could palpate a 2.5 cm multifaceted rock hard  mass in the cecum which I could not ballot or push out of the cecum.  I felt  no other abnormality of the terminal ileum, appendix or right colon.  Vikki Ports, MD and I discussed operative strategies and elected  to transect the tip of the cecum which would include the palpable mass and  the appendix.  We did this with a GIA stapling device.  We found that we  could place the GIA stapler and get a nice wide resection of this mass and did not compromise  the ileocecal valve at all.  Specimen was sent to the lab  after examination in the operating room.  There was no sign of any tumor and  pathology said this looked like a calcified fecalith.  We did not feel like  any further resection was warranted.  We changed our  instruments and gloves.  We oversewed the staple line of the cecum with 3-0 silk sutures, imbricating  some of the fatty tissues over the top of it. We irrigated the tissues and  returned them to the abdominal cavity.  The posterior rectus sheath was  closed with a running suture of #1 PDS.  The midline fascia was closed with  interrupted sutures of #1 Novofil.  The anterior rectus sheath was closed  with a running suture of #1 PDS.  All the skin incisions were closed with  skin staples.   Prior to closing the skin incisions, we did reinsufflate the abdomen and we  did visualize the operative field and felt that the bowel looked healthy.  There was no bleeding or residual blood anywhere.  After completing  desufflation of  the abdomen and closure of all the wounds, clean bandages were placed.  The  patient was then transferred to the recovery room in stable condition.  The  estimated blood loss was about 40 to 50 mL.  Complications were none.  Sponge, needle and instrument counts were correct.     Hayw   HMI/MEDQ  D:  03/17/2004  T:  03/17/2004  Job:  [id number]   cc:   Everardo All. Madilyn Fireman, M.D.  [address]  [place]  [place] [address]  Fax: [phone]   Lovenia Kim, D.O.  [address]  [place]  [place] [address]  Fax: [phone]

## 2010-07-30 NOTE — H&P (Signed)
NAMERENNE, CORNICK NO.:  [id number]   MEDICAL RECORD NO.:  [id number]          PATIENT TYPE:  EMS   LOCATION:  ED                           FACILITY:  [facility]   PHYSICIAN:  Elliot Cousin, M.D.    DATE OF BIRTH:  October 23, 1950   DATE OF ADMISSION:  06/16/2005  DATE OF DISCHARGE:                                HISTORY & PHYSICAL   PRIMARY CARE PHYSICIAN:  Dannielle Karvonen, M.D. [place], [place]   PRIMARY GASTROENTEROLOGIST:  Everardo All. Madilyn Fireman, M.D.   CHIEF COMPLAINT:  Generalized weakness, shortness of breath, dizziness.   HISTORY OF PRESENT ILLNESS:  The patient is a 60 year old lady with a past  medical history significant for hyperlipidemia, fibromyalgia, and chronic  fatigue syndrome.  The patient underwent an EGD by Dr. Madilyn Fireman yesterday,  June 15, 2005, for evaluation of crampy abdominal pain.  When the patient  was initially evaluated by Dr. Madilyn Fireman, per her history, blood work was  performed.  The patient was subsequently told that her hemoglobin was low.  The patient reports that the EGD revealed a non-bleeding ulcer.  The  official report is not available at this time.  Per the patient, Dr. Madilyn Fireman  discontinued the Prilosec and started her on treatment with Nexium 40 mg  b.i.d.  The patient says that her abdominal pain is intermittent.  It is  primarily located in the epigastrium and radiates to the right lower  quadrant.  She denies any black, tarry stools, or bright red blood per  rectum.  She has had some nausea, but no vomiting.  No difficulty urinating  and no pain with urination.  She takes Mobic twice daily and one 81 mg  aspirin daily.  She drinks alcohol only occasionally.  The patient says that  over the past few weeks she has been generally weak.  She does have a  history of chronic fatigue syndrome, however, she has been a bit more short  of breath lately.  No cough, no pleurisy, and no fevers or chills.  She has  also been lightheaded.  She has also had  some substernal pain without  associated diaphoresis or radiation.  No swelling of her legs.  Her appetite  has been fair to poor, although, she says that she been drinking enough  fluids.   During the evaluation in the emergency department, the patient is noted to  have an initial blood pressure of 96/62, but it was repeated at 88/37.  Her  heart rate is currently 66 and her respiratory rate is 18.  She is  oxygenating 99% on room air.   LABORATORY DATA:  Significant for a hemoglobin of 8.7, hematocrit of 26.1,  and MCV of 89.7.  Her EKG reveals normal sinus rhythm with a heart rate of  68 beats per minute.  Her PT and PTT are within normal limits.  The patient  will be admitted for further evaluation and management.   PAST MEDICAL HISTORY:  1.  Peptic ulcer disease.  Per patient history, an EGD was performed by Jonny Ruiz      C. Madilyn Fireman,  M.D. on June 15, 2005.  Results are unavailable at this time.  2.  Colonoscopy in October of 2005.  The results were normal per Dr. Madilyn Fireman.  3.  Status post diagnostic laparoscopy and resection of cecal tip and      appendix secondary to fecalith, January of 2006, per [facility]. Derrell Lolling,      M.D.  4.  Hyperlipidemia.  5.  Fibromyalgia.  6.  Chronic fatigue syndrome.  7.  Chronic anxiety.  8.  Degenerative joint disease.  9.  Status post cholecystectomy in March of 2004.  10. Status post total abdominal hysterectomy and bilateral salpingo-      oophorectomy secondary to endometriosis in the past.  11. Status post bilateral TMJ surgery in the past.  12. Status post bilateral bunion removal in the past.   MEDICATIONS:  1.  Nexium 40 mg b.i.d., prescribed June 15, 2005.  (The patient had been      previously taking Prilosec 20 mg daily).  2.  Estradiol 2 mg daily.  3.  Alprazolam 2 mg one tablet q.h.s. and one tablet daily p.r.n.  4.  Aspirin 81 mg daily.  5.  Phenergan 25 mg one tablet every 4-6 hours as needed for nausea.  6.  TriCor 145 mg q.h.s.   7.  Gabapentin 600 mg b.i.d. (the patient is prescribed t.i.d. dosing,      however, she only takes the gabapentin twice daily).  8.  Mobic 15 mg b.i.d.  9.  Zocor 40 mg q.h.s.  10. Vicodin 5/500 mg q.4 hours p.r.n. pain.   ALLERGIES:  SULFA, CODEINE, BUSPAR, AND CELEXA.   SOCIAL HISTORY:  The patient is divorced.  [place]  She has one daughter.  She is disabled from fibromyalgia and  chronic fatigue syndrome.  She drinks alcohol occasionally.  She denies  illicit drug use.  She smokes 1/2 pack of cigarettes per day and has been  doing so for approximately 40 years.   FAMILY HISTORY:  Her mother died of a stroke at 32 years of age.  Her father  died of heart failure at 41 years of age.  She has one sister who has breast  cancer.  Another sister has leukemia.  Another sister has heart trouble.  Another sister has COPD.  Another sister has rheumatoid arthritis.   REVIEW OF SYSTEMS:  Positive for chronic generalized muscle aches, chronic  fatigue, nausea, occasional right chest wall pain, occasional arthritis pain  in her legs.  Otherwise review of systems is negative.   PHYSICAL EXAMINATION:  VITAL SIGNS:  Temperature 97.3, blood pressure 96/62  repeated at 89/35, pulse 66, respiratory rate 18, oxygen saturation 99% on  room air.  GENERAL:  The patient is a pleasant 60 year old Caucasian lady who is  currently sitting up in bed in no acute distress.  HEENT:  Head is normocephalic and atraumatic.  Pupils equal, round, and  reactive to light.  Extraocular movements are intact.  Conjunctivae are  clear.  Sclerae are white.  Tympanic membranes are clear bilaterally.  Mucosa is mildly dry.  No sinus tenderness.  Oropharynx reveals fair  dentition.  Mucous membranes are dry.  No posterior exudate or erythema.  She does have mild pallor of her mucous membranes.  NECK:  Supple.  No adenopathy, no thyromegaly, no bruit, no JVD. LUNGS:  Decreased breath  sounds in the bases slightly, otherwise clear to  auscultation.  Breathing is unlabored.  HEART:  S1  and S2 with no murmurs, rubs, or gallops.  EXTREMITIES:  MUSCULOSKELETAL:  No tenderness to palpation of trigger  points.  No chest wall pain.  Pedal pulses barely palpable bilaterally.  No  pretibial edema.  No pedal edema.  The patient has only mild arthritic  changes in her knees and her hands bilaterally.  The range of motion of her  extremities is within normal limits.  NEUROLOGY:  The patient is alert and oriented x3.  Cranial nerves II-XII  grossly intact.  Strength is 5/5 throughout.  Sensation is intact.  SKIN:  General pallor.   LABORATORY DATA:  Chest x-ray pending.  EKG reveals normal sinus rhythm with  a heart rate of 68 beats per minute; no acute abnormalities seen.   WBC 8.7, hemoglobin 8.7, hematocrit 26.1, MCV 89.7, platelets 524.  Sodium  138, potassium 4.4, chloride 106, CO2 24, glucose 111, BUN 21, creatinine  1.1, calcium 9.4, total protein 6.3, albumin 3.1, AST 30, ALT 13, alkaline  phosphatase 51, total bilirubin 0.4.  PT 14.1, INR 1.1, PTT 32.   ASSESSMENT:  1.  Generalized weakness, shortness of breath, and lightheadedness.  The      most likely etiology is anemia.  The patient's other lab data are within      normal limits.  Her EKG is nonacute.  2.  Normocytic anemia.  The patient's hemoglobin today is 8.7; her baseline      is unknown at this time.  The patient's anemia is probably from chronic      versus subacute gastrointestinal blood loss.  Given the recent diagnosis      of peptic ulcer disease, this is likely the source of the patient's      chronic GI blood loss.  A rectal was not performed as the patient just      underwent an EGD.  Her colonoscopy in 2005 was essentially negative.  3.  Hypotension.  The patient's systolic blood pressures are ranging from 88      to 96  in the emergency department.  The hypotension is probably      secondary to  volume depletion from chronic GI blood loss.  4.  Peptic ulcer disease.  The official results of the EGD are unavailable      at this time. We will try to obtain the results from Dr. Madilyn Fireman.  The      most likely etiology of the patient's peptic ulcer disease is NSAID use      as the patient chronically takes Mobic and aspirin.  The patient says      that during the EGD, a biopsy was taken.  We will need to obtain the      results of the biopsy to rule out H.pylori-induced peptic ulcer disease.   PLAN:  The patient will be admitted for further evaluation and management.  She has received a bolus of IV fluids in the emergency department by the  emergency department physician.  She has been typed and crossed for two  units of packed red blood cells and is currently receiving 1 unit now.   We will transfuse a total of 2 units of packed red blood cells.  The  patient's hemoglobin and hematocrit will be monitored on a daily basis.  Discontinue Mobic and aspirin for now.  We will hold TriCor and Zocor given  the patient's mild abdominal discomfort.  Continue volume repletion with  normal saline.  Continue proton pump inhibitor therapy with Protonix  40 mg  IV q.12 hours.   We will assess the patient's TSH and cardiac markers.  We will also further  assess the patient's  ferritin, vitamin B12, and RBC folate.   We will check a chest x-ray to rule out any pulmonary process.   Continue chronic pain medications.   Consult Dr. Dorena Cookey, gastroenterologist, if needed.  The official results  of the EGD should be reviewed when available.      Elliot Cousin, M.D.  Electronically Signed     DF/MEDQ  D:  06/16/2005  T:  06/17/2005  Job:  [id number]   cc:   Everardo All. Madilyn Fireman, M.D.  Fax: [phone]

## 2010-07-30 NOTE — Op Note (Signed)
   NAME:  Lindsey Ward, Lindsey Ward                          ACCOUNT NO.:  [id number]   MEDICAL RECORD NO.:  [id number]                   PATIENT TYPE:  AMB   LOCATION:  ENDO                                 FACILITY:  [facility]   PHYSICIAN:  John C. Madilyn Fireman, M.D.                 DATE OF BIRTH:  1951-01-18   DATE OF PROCEDURE:  01/27/2003  DATE OF DISCHARGE:                                 OPERATIVE REPORT   PROCEDURE:  Colonoscopy.   INDICATION FOR PROCEDURE:  Colon cancer screening in a 60 year old patient  with no recent screening.   DESCRIPTION OF PROCEDURE:  The patient was placed in the left lateral  decubitus position and placed on the pulse monitor with continuous low-flow  oxygen delivered by nasal cannula.  She was sedated with 125 mcg IV fentanyl  and 12 mg IV Versed.  The Olympus video colonoscope was inserted into the  rectum and advanced to the cecum, confirmed by transillumination at  McBurney's point and visualization of the ileocecal valve and appendiceal  orifice.  The prep was good.  The terminal ileum was intubated and explored  for several centimeters and appeared to be within normal limits.  The cecum,  ascending, transverse, descending, sigmoid, and rectum all appeared normal  with no masses, polyps, diverticula, or other mucosal abnormalities.  There  was very lax sphincter tone and apparent disruption of the anal sphincter  muscle, the patient states was from remote childbirth trauma.  The scope was  withdrawn, and the patient returned to the recovery room in stable  condition.  She tolerated the procedure well, and there were no immediate  complications.   IMPRESSION:  Normal colonoscopy including terminal ileum with lax sphincter  tone due to remote anal sphincter trauma from childbirth.   PLAN:  Next screening by sigmoidoscopy in five years.                                               John C. Madilyn Fireman, M.D.    JCH/MEDQ  D:  01/27/2003  T:  01/27/2003  Job:   [id number]   cc:   Lovenia Kim, D.O.  [address]  [place]  [place] [address]  Fax: [phone]

## 2010-07-30 NOTE — Consult Note (Signed)
Lindsey Ward, Lindsey Ward                ACCOUNT NO.:  [id number]   MEDICAL RECORD NO.:  [id number]          PATIENT TYPE:  INP   LOCATION:  1411                         FACILITY:  [facility]   PHYSICIAN:  Petra Kuba, M.D.    DATE OF BIRTH:  May 18, 1950   DATE OF CONSULTATION:  06/18/2005  DATE OF DISCHARGE:  06/18/2005                                   CONSULTATION   HISTORY:  The patient was admitted with multiple complaints, weakness,  shortness of breath, dizziness.  Her hemoglobin was actually unchanged from  the office.  Dr. Madilyn Fireman on Wednesday did an EGD with biopsy.  I have talked  to him who filled me on her story.  He told me about the slightly increased  platelet count.  The hemoglobin essentially 8.6 and a prepyloric ulcer with  no stigmata of recent bleeding that he biopsied.  Biopsies are pending at  the time of dictation.  She has had some vague abdominal pains, mostly right  upper quadrant and midepigastric, worse when she eats.  It has been going on  about three months and has been on aspirin and Mobic which probably caused  the ulcer.  She has a long complicated history.  She has been followed by  Dr. Madilyn Fireman and Dr. Derrell Lolling.  Wound up having a right cecal surgery after  significant workup.   PAST MEDICAL HISTORY:  Pertinent for the ulcers mentioned above as well as  the colon surgery.  She also has increased cholesterol, fibromyalgias,  chronic fatigue syndrome, chronic anxiety, DJD and a history of a  cholecystectomy, as well as a hysterectomy, endometriosis, TMJ and bunion  removals.   MEDICATIONS:  At home include Nexium b.i.d., Estradiol, Xanax, aspirin,  Phenergan, Tricor, Neurontin, Mobic, Zocor, and Vicodin.   ALLERGIES:  SULFA, CODEINE, BUSPAR, and CELEXA.   SOCIAL HISTORY:  Drinks occasionally.  Smokes a half a pack per day.   FAMILY HISTORY:  Negative for any obvious GI problems.   REVIEW OF SYSTEMS:  Pertinent for multiple complaints.  Some constipation.  Has  been to [facility] for constipation.  Does take GlycoLax p.r.n. She,  herself, has not seen any blood.  No black, tarry stools or bright red  blood.   PHYSICAL EXAMINATION:  VITAL SIGNS:  See chart.  No acute distress.  ABDOMEN:  Soft and nontender.  She has not had any belly pain.   LABORATORY DATA:  Pertinent for a BUN of 18, creatinine of 1.  Hemoglobin  was 8.6 with a low normal MCV.  After transfusion increased to 10.3.  Ferritin of 14.  TSH normal.  B12 okay.   ASSESSMENT:  1.  Multiple medical problems.  2.  Prepyloric gastric ulcer, status post biopsy.  Pathology pending.      Probably aspirin and Mobic induced.  3.  Chronic anemia, unchanged from in the office.  Probably due to the ulcer      periodically oozing.   PLAN:  Since she has no signs of bleeding, can slowly advance diet and go  home soon to follow up with Dr. Madilyn Fireman  on the phone regarding the biopsies.  I would not put her on any aspirin, nonsteroidals or Mobic.  Treat the  patient with pain medicines.  Can slowly start iron.  I briefly discussed  changing her stools black which might scare her and  also some constipation which might need to increase the GlycoLax.  Dr. Madilyn Fireman  is planning a repeat endoscopy in two months which I think is appropriate  unless other symptoms or signs of repeat bleeding continue.  Let me know if  I can help.  In the meantime continue b.i.d. pump inhibitors.           ______________________________  Petra Kuba, M.D.     MEM/MEDQ  D:  06/17/2005  T:  06/18/2005  Job:  [id number]   cc:   [facility]   John C. Madilyn Fireman, M.D.  Fax: [phone]   Clayborn Bigness, M.D.  [place], [place]

## 2010-07-30 NOTE — Discharge Summary (Signed)
Lindsey Ward, SCHNECK                ACCOUNT NO.:  [id number]   MEDICAL RECORD NO.:  [id number]          PATIENT TYPE:  INP   LOCATION:  4729                         FACILITY:  [facility]   PHYSICIAN:  Broadus John T. Pickard II, MDDATE OF BIRTH:  1950/09/06   DATE OF ADMISSION:  11/01/2005  DATE OF DISCHARGE:  11/03/2005                                 DISCHARGE SUMMARY   ATTENDING AT DISCHARGE:  Camille L. Mardelle Matte, M.D.   ACTING INTERN AT DISCHARGE:  Corbin Ade, Intern, MS-4   PRIMARY CARE PHYSICIAN:  Seymour Bars. D.O.   REASON FOR ADMISSION:  1. Dizziness.  2. Presyncope.  3. Nausea.  4. Hypotension.   DISCHARGE DIAGNOSES:  1. Chronic hypotension.  2. Confusion, dizziness, slurred speech, most likely secondary to      dehydration and polypharmacy.  3. Gastric ulcer status post EGD April of 2007.  4. Iron deficiency anemia.  5. Fibromyalgia.  6. Chronic fatigue syndrome.  7. Chronic weakness.  8. Status post resection of cecal tip and appendix in January of 2006.  9. Hyperlipidemia.  10.Chronic anxiety.  11.Osteoarthritis.   PROCEDURES:  1. A CT of the head without contrast was done on August 21st and showed a      normal study with no evidence of recent stroke or hemorrhage.  2. Portable chest x-ray on August 21st showed no active cardiopulmonary      disease.  3. Carotid Doppler studies performed on August 22nd showed vertebral      artery flow antegrade bilaterally.  It also showed no significant ICA      stenosis on the right or left side.   LABORATORY DATA ON ADMISSION:  CBC showed a white count of 9, hemoglobin  12.2, hematocrit 36, platelets 309, PT was 15.2, INR 1.2, PTT was 25.  BMP  showed a sodium of 141, potassium 4.1, chloride 106, bicarb 29, BUN 13,  creatinine 1.2, glucose 114.  T bili was 0.7, alk phos 45, AST 30, ALT 19,  lipase 26.  CK-MB was 1.1, troponin was 0.05, myoglobin was 75.6 and a  urinalysis showed specific gravity of 1.018 although otherwise within  normal  limits, lipase was 26 and urine drug screen was positive for benzodiazepines  although the patient did have a known use of Xanax.   LABORATORY DATA AT DISCHARGE:  CBC showed a white count of 4.9, hemoglobin  9.7, hematocrit 28.2 with an MCV of 96, platelet count was 234.  Basic  metabolic panel showed a sodium of 143, potassium 3.6, chloride 115, bicarb  24, BUN 8, creatinine 0.9, glucose 133, calcium 8.7.  TSH was normal at  1.755.  Patient was fecal occult blood-negative.  Homocystine level was  normal at 12.8.  Blood cultures x2 were negative at two days' growth.  Random cortisol level was 7.8.  Cosyntropin test was normal, as cortisol  measured at 30 minutes after the cosyntropin injection was 22.8 and at one  hour after injection was 35.8.   HOSPITAL COURSE:  1. The patient is a 60 year old white female who has a long history of  chronic fatigue and pain syndromes, is on multiple medications for      these that presented with a one-to-two-day history of nausea,      confusion, slurred speech and dizziness as well as presyncope and some      stumbling into the walls.  She was, thus, admitted because she was      found to be hypotensive with a systolic blood pressure of 81 over at      [facility].  She was given lots of IV fluids while in-house      at [facility], she was given a telemetry bed and was found to be in      normal sinus rhythm throughout her stay except for being bradycardic      which is a chronic problem for her.  On admission, patient was found to      be mildly dehydrated and we feel like this combined with many of her      mood-altering medications most likely contributed to her confusion and      dizziness at presentation.  We, thus, reduced her Xanax on admission      from 2 to .5 b.i.d.  We also held her Phenergan, we reduced her      Neurontin from 600 to 300 b.i.d. and we also held her Darvocet.  With      reduction in this medication as  well as off of IV fluids, patient began      to feel much better.  At time of discharge, she was not experiencing      any nausea, was tolerating a regular diet, she was able to ambulate      without feeling syncopal or having any blurred vision.  To rule out any      organic cause of her presentation, our team decided to get carotid      Doppler studies to look for any type of carotid stenosis, however these      results proved to be normal.  Because she also has a chronic history of      hypotension and also, again, had this presentation of dizziness and      confusion, we decided to perform a cosyntropin test to see if her      adrenal glands and catecholamines were functioning properly, however      this test also proved to be normal.  Again, at the time of discharge,      patient was no longer complaining of weakness, dizziness or inability      to walk, she had no more nausea and was able to tolerate a regular      diet.  We, thus, felt she was stable for discharge home to follow up      with her primary care physician in two weeks to discuss some of her      medication choices and to see if she can modify this regimen in any      way.  2. Chronic hypotension:  Again, we got the cosyntropin test to test for      any type of adrenal insufficiency problem, however the results  were      normal with this.  We feel like this could possibly be a normal      physiological variant for the patient.  She will follow up with her      primary care physician about this and can consider being started on  something such as midodrine to help with her bradycardia and      hypotension.  3. History of gastric ulcer bleeding:  Patient was kept off any type of      NSAID medication while in the hospital and she was kept on her Nexium      dose during her hospital stay.  4. Hyperlipidemia:  Patient was actually switched from what she came in     on, Zocor 40 mg a day, however she was also taking  Tricor 145 mg q.h.s.      Pharmacy did not feel like this was a good combination and felt that      this could possibly lead to some liver problems as well as      rhabdomyolysis.  Thus, pharmacy felt it was necessary to switch her to      Lipitor 20 q.d. which is what she was discharged on.  5. History of chronic fatigue syndrome, chronic weakness and fibromyalgia:      Again, patient was kept on half her home dose of Neurontin, was also      given Tylenol p.r.n. and Ultram p.r.n. to help combat her pain and      fatigue while also avoiding making her too sedated if this prove to be      effective pain control during her hospital stay.  6. Iron deficiency anemia:  Patient will be discharged on ferrous sulfate      325 a day to help with this problem as well.  There was concern, as on      admission she had a hemoglobin of 12.2 which on day two dropped down to      9.6 to 9.7, however due to the rest of her lab values also dropping, we      felt this was most likely due to hemodilution.  Fecal occult blood test      was performed on her stool which showed to be negative.  We, thus, felt      that she had no active bleeding at this time and felt she was safe for      discharge.   DISCHARGE MEDICATIONS:  1. Tricor 145 mg one p.o. q.h.s.  2. Lipitor 20 mg one p.o. q.d.  3. Estradiol 2 mg one p.o. q.d.  4. Xanax 2 mg one q.h.s. and one q.d. as needed for anxiety.  5. Neurontin 600 mg one p.o. b.i.d.  6. Iron sulfate 325 mg one p.o. q.d.  7. Phenergan as needed for nausea.  8. Darvocet as needed for pain.   DISPOSITION:  The patient will follow up with her primary care physician,  Dr. Cathey Endow, over in [place] in approximately two weeks, patient was  instructed to call and make a follow-up appointment for this.  At this  follow-up visit, it is recommended strongly that Dr. Cathey Endow discuss with the  patient her medication regimen in trying to control her pain involved with  the chronic  fatigue syndrome, weakness and fibromyalgia.  Time certainly  need to be taken to discuss which medications are necessary and which are  not to help prevent another admission such as this one and to keep the  patient from being too sedated, nauseous and confused from the medication.  Also given the patient's history of iron deficiency anemia, Dr. Cathey Endow can  also consider getting a CBC to check her hemoglobin, hematocrit and MCV.  Dr. Cathey Endow can also discuss with the patient the switch from  Zocor to Lipitor  for her hyperlipidemia.   DISCHARGE INSTRUCTIONS:  Patient has no restrictions on diet or activity.  The patient is strongly encouraged to drink plenty of fluids to prevent  dehydration.  She is also strongly encouraged by the family practice team to  decrease the use of Darvocet and Phenergan as much as possible until she has  visited with Dr. Cathey Endow and discussed her medication regimen.  She was also told that Xanax was not particularly a great medicine for people over the  age of 54 and should also discuss finding a different benzodiazepine for her  anxiety if she does, in fact, require one.     ______________________________  Corbin Ade, Intern MS-4    ______________________________  Priscille Heidelberg. Pamalee Leyden, MD    JL/MEDQ  D:  11/03/2005  T:  11/03/2005  Job:  [id number]   cc:   Seymour Bars, D.O.

## 2010-07-30 NOTE — Assessment & Plan Note (Signed)
MEDICAL RECORD NUMBER:  [id number].   Ms. Enriques is a 60 year old lady who was last seen by me May 18, 2004 and has  fibromyalgia syndrome. She has already had rheumatologic workup. She in the  interval time has come off of oxycodone. She continues to take Xanax 2 mg at  night.   She also takes Neurontin 600 p.o. b.i.d., prescribed t.i.d., but she thinks  it is a bit too sedating for that. She is independant with self-care as well  as doing her yard work. She really does not do any type of formal exercise  program.   SOCIAL HISTORY:  Divorced. Lives with her friend. Smokes. Retired since  2002.   Current pain level is 9/10. Pain interference score is general activity 9,  relationship with other people 8, enjoyment of life 8.   PHYSICAL EXAMINATION:  VITAL SIGNS:  Blood pressure 119/52, pulse 61,  respirations 18. O2 saturation is 97% on room air.  GENERAL:  No acute distress. Mood and affect appropriate.   Her back has no tenderness to palpation. She has some tenderness to  palpation bilateral upper trapezius. She has good range of motion at the  hips but some pain at end-range internal rotation bilateral hips. She has no  evidence of swelling. No sensory loss in the lower extremities. She has full  range of motion at knees and ankles.   Her motor strength is 5-/5 in hip flexion, knee extension, ankle dorsi  flexion.   IMPRESSION:  1.  Fibromyalgia syndrome.  2.  Possible osteoarthritis of the hips causing some pain with range of      motion. On the other hand, this could be possibly related to her      fibromyalgia as well.   PLAN:  1.  Discussed her medications overall stating that the Xanax is not a good      long-term sleep medication to alter her sleep cycles. For that reason,      we will give her a trial of Rozerem and wean her Xanax to 1 mg for one      week at night and then discontinued.  2.  Given some sedation she has had Neurontin, she does not take the  frequent dosing. We will give her a trial of Lyrica, start at 75 b.i.d.,      and then increase to 150 b.i.d. after one week.  3.  Will reduce the Neurontin to 300 twice a day for one week and then stop.  4.  I will see her back in one month to see how she is doing with the      medication changes.      AEK/MedQ  D:  08/10/2004 13:25:44  T:  08/10/2004 14:25:54  Job #:  [id number]

## 2010-07-30 NOTE — Group Therapy Note (Signed)
MEDICAL RECORD NUMBER:  [id number].   REQUESTING PHYSICIAN:  Lovenia Kim, D.O.   Dear Dr. Elisabeth Ward,   Thank you for the consult request regarding Lindsey Ward. As you know, she  is a 60 year old female who has had widespread body pain since 1994. She has  not had any traumatic injuries, no work-related injuries. She states that  her pain has gradually gotten worse with time. She has been evaluated by  rheumatology, and no other rheumatologic disease is present, and in fact,  she has been confirmed in the diagnosis of fibromyalgia by Dr. Kellie Simmering July 02, 2003 and apparently had seen her earlier February 20, 2001. She was on  OxyContin at one time and has been weaned off of this. She was seen by  psychiatry for a number of years, and she no longer sees psychiatry. The  patient has been taken off of Seroquel, and her Xanax dosage has been  reduced. The patient has had neurologic workup per Dr. Orlin Hilding, and there  was no evidence of peripheral neuropathy on EMG testing. She had had MRI of  the brain that was reportedly normal in 2003. She is now off of nonsteroidal  anti-inflammatories. In terms of medications that she has tried, she is  currently on Darvocet which does not help much with her pain. She states  that taking one hydrocodone a day in the past has helped her get up and get  moving out of bed.   The patient states she has not had any physical therapy for her  fibromyalgia. Her exercise is rather limited. She states that she does walk  around her backyard which she estimates at around an acre, but she does not  do this consistently.   Pain is rated at a 9/10. The pain diagram shows pain both shoulders, neck,  bilateral hands, bilateral buttocks, bilateral hips and knees, and calves.  Her pain reportedly improves with rest and heat. Made worse with walking,  bending, sitting, and working.   PAST MEDICAL HISTORY:  As noted above. She had what she terms as a seizure  with  Cymbalta, but it sounds like she had more or less orthostatic  hypotension when she stood up from a prolonged sitting position.   She smokes less than a half a pack a day but has been smoking for about 30  years. She lives with a friend and is divorced. She was reportedly disabled  four years ago.   REVIEW OF SYSTEMS:  Positive in GI, neuropsych, and other, please refer to  sheet. She has poor sleep. She specifically denies suicidal thoughts. She  has fatigue.   PHYSICAL EXAMINATION:  Blood pressure 102/42, pulse 68, respirations 16, O2  saturation 94% on room air. Gait is normal. Affect is bright and alert.  Appearance is normal otherwise.   Back:  She has full range of motion of her lumbar spine with the exception  of extension causes some pain with about 50% of normal range. She has normal  strength bilateral upper and lower extremities, normal range of motion  bilateral upper and lower extremities. She has no instability in her joints.  She has positive fibromyalgia tender points bilateral upper trapezius,  bilateral sternocostal, bilateral hips, and bilateral knees for a total of  8.   Gait is without evidence of toe drag or knee instability.   IMPRESSION:  1.  Fibromyalgia syndrome. She may have some concomitant facet arthropathy      causing some stiffness in the  morning and with prolonged sitting.  2.  Depression.  3.  History of gastroesophageal reflux disease.  4.  History of anxiety.   PLAN:  1.  I have reviewed her medication intolerances. These include morphine,      BuSpar, sulfa, Cipro, Calan, Propulsid, and Cymbalta, and therefore, we      will avoid any related medications.  2.  We will get urine drug screen today to assess for illicit drug usage      versus nonreported opiate or other controlled substance use.  3.  I think it is reasonable that hydrocodone 5/500 be given once every      morning.  4.  The patient has agreed to attend physical therapy two times  per week for      three weeks.  5.  I will see her back in four weeks.  6.  Consider other medications including Lyrica in which case we would wean      her off the Neurontin and perhaps off of Xanax as well since this does      have an anxiolytic effect. Will monitor for dosage escalation of      hydrocodone, and she will need to stay strictly within prescription      guidelines and not have any other physicians prescribe this medication      for her.      AEK/MedQ  D:  04/16/2004 14:09:31  T:  04/16/2004 14:43:07  Job #:  [id number]   cc:   Lovenia Kim, D.O.  [address], [address]  [place]  [place] [address]  Fax: [phone]

## 2010-08-05 ENCOUNTER — Other Ambulatory Visit: Payer: Self-pay | Admitting: Gastroenterology

## 2010-08-18 ENCOUNTER — Other Ambulatory Visit: Payer: Self-pay | Admitting: *Deleted

## 2010-08-18 NOTE — Telephone Encounter (Signed)
Faxed requests from [facility] are on your desk.  Please check quantities and refills.

## 2010-08-19 MED ORDER — HYDROCODONE-ACETAMINOPHEN 5-325 MG PO TABS: 1.0000 | ORAL_TABLET | Freq: Three times a day (TID) | ORAL | Status: DC | PRN

## 2010-08-19 MED ORDER — ALPRAZOLAM 1 MG PO TABS: 1.0000 mg | ORAL_TABLET | Freq: Four times a day (QID) | ORAL | Status: DC

## 2010-08-19 NOTE — Telephone Encounter (Signed)
rx faxed to pharmacy manually  

## 2010-08-19 NOTE — Telephone Encounter (Signed)
Okay #90 x 0 of hydrocodone and #120 x 0 of alprazolam

## 2010-08-20 ENCOUNTER — Encounter: Payer: Self-pay | Admitting: Internal Medicine

## 2010-08-31 ENCOUNTER — Ambulatory Visit: Payer: Medicare Other | Admitting: Gastroenterology

## 2010-09-13 ENCOUNTER — Encounter: Payer: Self-pay | Admitting: Internal Medicine

## 2010-09-13 ENCOUNTER — Ambulatory Visit (INDEPENDENT_AMBULATORY_CARE_PROVIDER_SITE_OTHER): Payer: Medicare Other | Admitting: Internal Medicine

## 2010-09-13 DIAGNOSIS — IMO0001 Reserved for inherently not codable concepts without codable children: Secondary | ICD-10-CM

## 2010-09-13 DIAGNOSIS — J189 Pneumonia, unspecified organism: Secondary | ICD-10-CM

## 2010-09-13 DIAGNOSIS — Z23 Encounter for immunization: Secondary | ICD-10-CM

## 2010-09-13 DIAGNOSIS — F17210 Nicotine dependence, cigarettes, uncomplicated: Secondary | ICD-10-CM

## 2010-09-13 DIAGNOSIS — Z2911 Encounter for prophylactic immunotherapy for respiratory syncytial virus (RSV): Secondary | ICD-10-CM

## 2010-09-13 DIAGNOSIS — F411 Generalized anxiety disorder: Secondary | ICD-10-CM

## 2010-09-13 DIAGNOSIS — F17200 Nicotine dependence, unspecified, uncomplicated: Secondary | ICD-10-CM

## 2010-09-13 MED ORDER — BENZONATATE 100 MG PO CAPS: 100.0000 mg | ORAL_CAPSULE | Freq: Three times a day (TID) | ORAL | Status: AC | PRN

## 2010-09-13 NOTE — Assessment & Plan Note (Signed)
Is still smoking We agreed chantix isn't an option  She failed with gum (made her sick) and electronic cigarette Discussed mental outlook Will have her try nicotine patch

## 2010-09-13 NOTE — Assessment & Plan Note (Signed)
Chronic but no major change Uses the xanax regularly

## 2010-09-13 NOTE — Assessment & Plan Note (Signed)
Ongoing pain issues On the gabapentin and prn hydrodocodone

## 2010-09-13 NOTE — Progress Notes (Signed)
Subjective:    Patient ID: Lindsey Ward, female    DOB: 1951/01/01, 60 y.o.   MRN: [id number]  [place] Got sick Diagnosed at urgent care with RML pneumonia Given shot of rocephin which really seemed to help. Put on biaxin, benzonatate and proair Didn't use inhaler Still has some cough--intermittent. Benzonatate really helped No fever Cough is productive of clear phlegm No SOB  No longer taking any of the muscle relaxers Still on gabapentin for the fibromyalgia---then hydrocodone only if really bad Gets stress and spasm in neck--"I need something to relax" Xanax hasn't been working that much  Only able to sleep 4 hours a night--despite gabapentin and 2 xanax Sleeps for a few hours, then wakes up again Not depressed though  Current Outpatient Prescriptions on File Prior to Visit  Medication Sig Dispense Refill  . ALPRAZolam (XANAX) 1 MG tablet Take 1 tablet (1 mg total) by mouth 4 (four) times daily.  120 tablet  0  . butalbital-acetaminophen-caffeine (FIORICET, ESGIC) 50-325-40 MG per tablet Take 2 tablets at onset of migraine headache  30 tablet  0  . gabapentin (NEURONTIN) 600 MG tablet TAKE 4 TABLETS AT BEDTIME  120 tablet  4  . HYDROcodone-acetaminophen (NORCO) 5-325 MG per tablet Take 1 tablet by mouth 3 (three) times daily as needed.  90 tablet  0  . sennosides-docusate sodium (SENOKOT-S) 8.6-50 MG tablet Take 2 tablets by mouth 2 (two) times daily.        Marland Kitchen DISCONTD: cyclobenzaprine (FLEXERIL) 5 MG tablet Take 5 mg by mouth 2 (two) times daily as needed.        Marland Kitchen DISCONTD: meclizine (ANTIVERT) 25 MG tablet Take 25 mg by mouth 2 (two) times daily as needed. As needed for Dizziness       . DISCONTD: polyethylene glycol (MIRALAX / GLYCOLAX) packet Take 17 g by mouth 2 (two) times daily.        Marland Kitchen DISCONTD: promethazine (PHENERGAN) 25 MG tablet Take 25 mg by mouth every 6 (six) hours as needed.          Allergies  Allergen Reactions  . Aspirin    REACTION: GI bleed  . Buspirone Hcl     REACTION: unspecified  . Ciprofloxacin     REACTION: unspecified  . Codeine   . Diazepam   . Duloxetine     REACTION: sz like activity  . Ibuprofen     REACTION: GI bleed  . Oxycodone-Acetaminophen     REACTION: unspecified  . Pregabalin     REACTION: kept her up and confusion  . Propoxyphene Hcl   . Risperidone     REACTION: confusion  . Sulfonamide Derivatives     REACTION: unspecified  . Tramadol Hcl     REACTION: doesn't remember what happened but couldn't tolerate  . Venlafaxine     REACTION: unspecified    Past Medical History  Diagnosis Date  . Chronic fatigue   . Obstipation   . Depression   . Hyperlipemia   . GERD (gastroesophageal reflux disease)   . PUD (peptic ulcer disease)   . Migraine   . Osteopenia     Past Surgical History  Procedure Date  . Appendectomy 2005  . Cholecystectomy 2004  . Total abdominal hysterectomy w/ bilateral salpingoophorectomy 1986    Family History  Problem Relation Age of Onset  . Heart disease Mother     AMI  . Stroke Mother   . Heart disease Father   .  Hypertension Sister   . Hypertension Sister   . Heart disease Sister     MI  . Diabetes Sister     History   Social History  . Marital Status: Divorced    Spouse Name: N/A    Number of Children: 1  . Years of Education: N/A   Occupational History  . DISABLED    Social History Main Topics  . Smoking status: Current Everyday Smoker -- 1.0 packs/day for 40 years    Types: Cigarettes  . Smokeless tobacco: Never Used  . Alcohol Use: No  . Drug Use: No  . Sexually Active: Not on file   Other Topics Concern  . Not on file   Social History Narrative  . No narrative on file   Review of Systems Appetite is not that big--but no change Weight is stable     Objective:   Physical Exam  Constitutional: She appears well-developed and well-nourished. No distress.  Neck: Normal range of motion. Neck supple. No  thyromegaly present.  Cardiovascular: Normal rate, regular rhythm, normal heart sounds and intact distal pulses.  Exam reveals no gallop.   No murmur heard. Pulmonary/Chest: Effort normal and breath sounds normal. No respiratory distress. She has no wheezes. She has no rales.  Abdominal: Soft. There is no tenderness.  Musculoskeletal: Normal range of motion. She exhibits no edema and no tenderness.       No findings in left shoulder or arm  Lymphadenopathy:    She has no cervical adenopathy.  Psychiatric: Her behavior is normal. Judgment and thought content normal.       Mild anxiety          Assessment & Plan:

## 2010-09-13 NOTE — Patient Instructions (Signed)
Please try a 14mg  nicotine patch daily for at least 2-3 months. If you have vivid dreams, take the patch off at night and put new one on immediately upon awakening in the morning

## 2010-09-13 NOTE — Assessment & Plan Note (Signed)
[place] Intermittent cough now but PE normal Will use benzonatate prn  Check CXR in about 2 weeks---she will go to [facility] in [place]

## 2010-09-21 ENCOUNTER — Other Ambulatory Visit: Payer: Self-pay | Admitting: *Deleted

## 2010-09-21 NOTE — Telephone Encounter (Signed)
Faxed request from [facility] is on your desk.

## 2010-09-22 ENCOUNTER — Other Ambulatory Visit: Payer: Self-pay | Admitting: Internal Medicine

## 2010-09-22 MED ORDER — ALPRAZOLAM 1 MG PO TABS: 1.0000 mg | ORAL_TABLET | Freq: Four times a day (QID) | ORAL | Status: DC

## 2010-09-22 NOTE — Telephone Encounter (Signed)
rx faxed to pharmacy manually  

## 2010-09-22 NOTE — Telephone Encounter (Signed)
Okay #120 x 0 

## 2010-09-22 NOTE — Telephone Encounter (Signed)
Okay #30 x 0 

## 2010-09-22 NOTE — Telephone Encounter (Signed)
rx sent to pharmacy by e-script  

## 2010-10-14 ENCOUNTER — Other Ambulatory Visit: Payer: Self-pay | Admitting: Internal Medicine

## 2010-10-15 ENCOUNTER — Other Ambulatory Visit: Payer: Self-pay | Admitting: Internal Medicine

## 2010-10-15 DIAGNOSIS — Z1231 Encounter for screening mammogram for malignant neoplasm of breast: Secondary | ICD-10-CM

## 2010-10-18 ENCOUNTER — Telehealth: Payer: Self-pay | Admitting: *Deleted

## 2010-10-18 ENCOUNTER — Ambulatory Visit
Admission: RE | Admit: 2010-10-18 | Discharge: 2010-10-18 | Disposition: A | Discharge status: Home / Self Care | Payer: Medicare Other | Source: Ambulatory Visit | Attending: Internal Medicine | Admitting: Internal Medicine

## 2010-10-18 ENCOUNTER — Other Ambulatory Visit: Payer: Self-pay | Admitting: Internal Medicine

## 2010-10-18 DIAGNOSIS — J189 Pneumonia, unspecified organism: Secondary | ICD-10-CM

## 2010-10-18 NOTE — Telephone Encounter (Signed)
Patient says she was given the shingles vaccine from here at a visit with Dr. Alphonsus Sias and nothing was mentioned to her about the fact that some insurances did not cover it.  She has now received a bill for in excess of $234 and was told by her insurance that if she had received it at the pharmacy, it would only have costed $20.  She also has a charge on her statement for $21.20 for an injection for smoking that she says she never received.  I suggested she phone the billing number but she said she had done that but they couldn't help with the charges.

## 2010-10-20 ENCOUNTER — Other Ambulatory Visit: Payer: Self-pay | Admitting: *Deleted

## 2010-10-20 NOTE — Telephone Encounter (Signed)
Form on your desk  

## 2010-10-21 MED ORDER — ALPRAZOLAM 1 MG PO TABS: 1.0000 mg | ORAL_TABLET | Freq: Four times a day (QID) | ORAL | Status: DC

## 2010-10-21 NOTE — Telephone Encounter (Signed)
Okay #120 x 0 

## 2010-10-21 NOTE — Telephone Encounter (Signed)
rx faxed to pharmacy manually  

## 2010-11-02 ENCOUNTER — Ambulatory Visit: Payer: Medicare Other

## 2010-11-02 ENCOUNTER — Encounter: Payer: Self-pay | Admitting: Internal Medicine

## 2010-11-02 ENCOUNTER — Ambulatory Visit (INDEPENDENT_AMBULATORY_CARE_PROVIDER_SITE_OTHER): Payer: Medicare Other | Admitting: Internal Medicine

## 2010-11-02 DIAGNOSIS — G43909 Migraine, unspecified, not intractable, without status migrainosus: Secondary | ICD-10-CM

## 2010-11-02 DIAGNOSIS — R1031 Right lower quadrant pain: Secondary | ICD-10-CM

## 2010-11-02 NOTE — Progress Notes (Signed)
Subjective:    Patient ID: Lindsey Ward, female    DOB: May 09, 1950, 60 y.o.   MRN: [id number]  HPI Started with pain in RLQ 3 days ago Then radiated around to back Some better yesterday, but worse today  Constant pain. WOrse when moving about---like standing up or sitting Sharp and radiates--straight through to the back Not burning Not related to meals  No BM since pain started---not unusual for her Got sick taking miralax last time so has avoided--does use stool softener  No urinary symptoms No dysuria or hematuria  Current Outpatient Prescriptions on File Prior to Visit  Medication Sig Dispense Refill  . ALPRAZolam (XANAX) 1 MG tablet Take 1 tablet (1 mg total) by mouth 4 (four) times daily.  120 tablet  0  . butalbital-acetaminophen-caffeine (FIORICET, ESGIC) 50-325-40 MG per tablet TAKE 2 TABLETS ONSET OF MIGRAINE HEADACHE  30 tablet  0  . gabapentin (NEURONTIN) 600 MG tablet TAKE 4 TABLETS AT BEDTIME  120 tablet  4  . HYDROcodone-acetaminophen (NORCO) 5-325 MG per tablet Take 1 tablet by mouth 3 (three) times daily as needed.  90 tablet  0  . sennosides-docusate sodium (SENOKOT-S) 8.6-50 MG tablet Take 2 tablets by mouth 2 (two) times daily.          Allergies  Allergen Reactions  . Aspirin     REACTION: GI bleed  . Buspirone Hcl     REACTION: unspecified  . Ciprofloxacin     REACTION: unspecified  . Codeine   . Diazepam   . Duloxetine     REACTION: sz like activity  . Ibuprofen     REACTION: GI bleed  . Oxycodone-Acetaminophen     REACTION: unspecified  . Pregabalin     REACTION: kept her up and confusion  . Propoxyphene Hcl   . Risperidone     REACTION: confusion  . Sulfonamide Derivatives     REACTION: unspecified  . Tramadol Hcl     REACTION: doesn't remember what happened but couldn't tolerate  . Venlafaxine     REACTION: unspecified    Past Medical History  Diagnosis Date  . Chronic fatigue   . Obstipation   . Depression   . Hyperlipemia   .  GERD (gastroesophageal reflux disease)   . PUD (peptic ulcer disease)   . Migraine   . Osteopenia     Past Surgical History  Procedure Date  . Appendectomy 2005  . Cholecystectomy 2004  . Total abdominal hysterectomy w/ bilateral salpingoophorectomy 1986    Family History  Problem Relation Age of Onset  . Heart disease Mother     AMI  . Stroke Mother   . Heart disease Father   . Hypertension Sister   . Hypertension Sister   . Heart disease Sister     MI  . Diabetes Sister     History   Social History  . Marital Status: Divorced    Spouse Name: N/A    Number of Children: 1  . Years of Education: N/A   Occupational History  . DISABLED    Social History Main Topics  . Smoking status: Current Everyday Smoker -- 1.0 packs/day for 40 years    Types: Cigarettes  . Smokeless tobacco: Never Used  . Alcohol Use: No  . Drug Use: No  . Sexually Active: Not on file   Other Topics Concern  . Not on file   Social History Narrative  . No narrative on file   Review of Systems Legs  feel tingly Appetite is not great but not really changed No cough or SOB Migraines are more common    Objective:   Physical Exam  Constitutional: She appears well-developed and well-nourished.       Seems to be in great pain with movement--she is hard to judge though  Neck: Normal range of motion.  Cardiovascular: Normal rate, regular rhythm and normal heart sounds.  Exam reveals no gallop.   No murmur heard. Pulmonary/Chest: Effort normal and breath sounds normal. No respiratory distress. She has no wheezes. She has no rales.  Abdominal: Soft. Bowel sounds are normal. She exhibits distension. She exhibits no mass. There is no rebound and no guarding.       No real sig tenderness noted  Musculoskeletal: She exhibits no edema and no tenderness.       ROM of right hip is normal but she reports pain with full external rotation  Lymphadenopathy:    She has no cervical adenopathy.  Skin: No  rash noted.       No shingles          Assessment & Plan:

## 2010-11-02 NOTE — Assessment & Plan Note (Signed)
New pain Could be related to obstipation but not clear cut No sig GI or urinary symptoms per se No rash of shingles Gall bladder, appendix and uterus all surgically absent May be abdominal wall related but no known injury (she may have lifted a somewhat heavy suitcase the day before the pain started)  P: rest for now     Try heat     Recheck later this week if not resolved     Try senna s to keep bowels open

## 2010-11-02 NOTE — Assessment & Plan Note (Signed)
Had been on amlodipine in the past from Dr Meryl Crutch She asks for this again i asked her to keep a log and we will review this at next visit

## 2010-11-03 ENCOUNTER — Telehealth: Payer: Self-pay | Admitting: Internal Medicine

## 2010-11-03 NOTE — Telephone Encounter (Signed)
Patient's concerns sent to coding/billing to consider adjustment.

## 2010-11-08 ENCOUNTER — Other Ambulatory Visit: Payer: Self-pay | Admitting: *Deleted

## 2010-11-08 MED ORDER — BUTALBITAL-APAP-CAFFEINE 50-325-40 MG PO TABS: 2.0000 | ORAL_TABLET | ORAL | Status: DC | PRN

## 2010-11-08 NOTE — Telephone Encounter (Signed)
Form on your desk  

## 2010-11-08 NOTE — Telephone Encounter (Signed)
Okay #30 x 0 

## 2010-11-08 NOTE — Telephone Encounter (Signed)
rx sent to pharmacy by e-script  

## 2010-11-10 ENCOUNTER — Ambulatory Visit
Admission: RE | Admit: 2010-11-10 | Discharge: 2010-11-10 | Disposition: A | Discharge status: Home / Self Care | Payer: Medicare Other | Source: Ambulatory Visit | Attending: Internal Medicine | Admitting: Internal Medicine

## 2010-11-10 DIAGNOSIS — Z1231 Encounter for screening mammogram for malignant neoplasm of breast: Secondary | ICD-10-CM

## 2010-11-17 ENCOUNTER — Encounter: Payer: Self-pay | Admitting: *Deleted

## 2010-11-22 ENCOUNTER — Other Ambulatory Visit: Payer: Self-pay | Admitting: *Deleted

## 2010-11-22 MED ORDER — ALPRAZOLAM 1 MG PO TABS: 1.0000 mg | ORAL_TABLET | Freq: Four times a day (QID) | ORAL | Status: DC

## 2010-11-22 MED ORDER — HYDROCODONE-ACETAMINOPHEN 5-325 MG PO TABS: 1.0000 | ORAL_TABLET | Freq: Three times a day (TID) | ORAL | Status: DC | PRN

## 2010-11-22 NOTE — Telephone Encounter (Signed)
Okay #90 x 0 hydrocodone  #120 x 0 alprazolam

## 2010-11-22 NOTE — Telephone Encounter (Signed)
rx faxed to pharmacy manually  

## 2010-11-22 NOTE — Telephone Encounter (Signed)
Form on your desk  

## 2010-11-25 ENCOUNTER — Telehealth: Payer: Self-pay | Admitting: *Deleted

## 2010-11-25 NOTE — Telephone Encounter (Signed)
She will have to try a different laxative Dulcolax pill or suppository, magnesium citrate (1/2-1 bottle), MOM --esp with cascara  ---are all alternatives. Overall, I think the miralax is the gentlest though

## 2010-11-25 NOTE — Telephone Encounter (Signed)
Spoke with patient and advised results, she has an appointment with her GI physician tomorrow.

## 2010-11-25 NOTE — Telephone Encounter (Signed)
Pt called to report that she still has mid stomach and back pain.  She is still constipated but doesn't want to continue to take miralax.  She says she took so much that it stopped wanting to go down.  Please advise on what to do next.

## 2010-11-26 ENCOUNTER — Ambulatory Visit
Admission: RE | Admit: 2010-11-26 | Discharge: 2010-11-26 | Disposition: A | Discharge status: Home / Self Care | Payer: Medicare Other | Source: Ambulatory Visit | Attending: Gastroenterology | Admitting: Gastroenterology

## 2010-11-26 ENCOUNTER — Other Ambulatory Visit: Payer: Self-pay | Admitting: Gastroenterology

## 2010-11-26 DIAGNOSIS — R109 Unspecified abdominal pain: Secondary | ICD-10-CM

## 2010-11-29 ENCOUNTER — Other Ambulatory Visit: Payer: Self-pay | Admitting: Internal Medicine

## 2010-11-30 ENCOUNTER — Ambulatory Visit (INDEPENDENT_AMBULATORY_CARE_PROVIDER_SITE_OTHER): Payer: Medicare Other | Admitting: Internal Medicine

## 2010-11-30 DIAGNOSIS — G43909 Migraine, unspecified, not intractable, without status migrainosus: Secondary | ICD-10-CM

## 2010-11-30 MED ORDER — AMLODIPINE BESYLATE 2.5 MG PO TABS: 2.5000 mg | ORAL_TABLET | Freq: Every day | ORAL | Status: DC

## 2010-12-01 NOTE — Progress Notes (Signed)
  Subjective:    Patient ID: Lindsey Ward, female    DOB: 05/09/1950, 60 y.o.   MRN: [id number]  HPI See scanned note   Review of Systems     Objective:   Physical Exam        Assessment & Plan:

## 2010-12-21 ENCOUNTER — Other Ambulatory Visit: Payer: Self-pay | Admitting: Internal Medicine

## 2010-12-22 NOTE — Telephone Encounter (Signed)
Okay #30 x 0 

## 2010-12-22 NOTE — Telephone Encounter (Signed)
rx sent to pharmacy by e-script  

## 2010-12-28 ENCOUNTER — Other Ambulatory Visit: Payer: Self-pay | Admitting: *Deleted

## 2010-12-29 MED ORDER — ALPRAZOLAM 1 MG PO TABS: 1.0000 mg | ORAL_TABLET | Freq: Four times a day (QID) | ORAL | Status: DC

## 2010-12-29 MED ORDER — HYDROCODONE-ACETAMINOPHEN 5-325 MG PO TABS: 1.0000 | ORAL_TABLET | Freq: Three times a day (TID) | ORAL | Status: DC | PRN

## 2010-12-29 NOTE — Telephone Encounter (Signed)
rx called into pharmacy

## 2010-12-29 NOTE — Telephone Encounter (Signed)
Okay #120 x 0 of xanax  #90 x 0 of the hydrocodone

## 2011-01-10 ENCOUNTER — Encounter: Payer: Self-pay | Admitting: Internal Medicine

## 2011-01-10 ENCOUNTER — Ambulatory Visit (INDEPENDENT_AMBULATORY_CARE_PROVIDER_SITE_OTHER): Payer: Medicare Other | Admitting: Internal Medicine

## 2011-01-10 VITALS — BP 111/51 | HR 62 | Temp 98.5°F | Ht 62.0 in | Wt 117.0 lb

## 2011-01-10 DIAGNOSIS — G43909 Migraine, unspecified, not intractable, without status migrainosus: Secondary | ICD-10-CM

## 2011-01-10 MED ORDER — AMLODIPINE BESYLATE 5 MG PO TABS: 5.0000 mg | ORAL_TABLET | Freq: Every day | ORAL | Status: DC

## 2011-01-10 NOTE — Patient Instructions (Signed)
Please cancel your November visit

## 2011-01-10 NOTE — Assessment & Plan Note (Signed)
counseled all of 15 minute visit Wants to go up on the amlodipine---this seems to have helped some  BP Readings from Last 3 Encounters:  01/10/11 111/51  11/02/10 118/68  09/13/10 110/60   Will increase to 5mg  Consider nortriptylline at bedtime

## 2011-01-10 NOTE — Progress Notes (Signed)
Subjective:    Patient ID: Lindsey Ward, female    DOB: 06/04/1950, 60 y.o.   MRN: [id number]  HPI Headaches have been a big problem Has tried the amlodipine and may have had some improvement but still having headaches 3-4 days per week fioricet still helps most of the time  No dizziness or syncope (always has a little dizziness if she jumps up too quick) No swelling in ankles  Current Outpatient Prescriptions on File Prior to Visit  Medication Sig Dispense Refill  . ALPRAZolam (XANAX) 1 MG tablet Take 1 tablet (1 mg total) by mouth 4 (four) times daily.  120 tablet  0  . amLODipine (NORVASC) 2.5 MG tablet Take 1 tablet (2.5 mg total) by mouth daily.  30 tablet  11  . butalbital-acetaminophen-caffeine (FIORICET, ESGIC) 50-325-40 MG per tablet TAKE 2 TABLETS BY MOUTH AS NEEDED FOR HEADACHE.  30 tablet  0  . gabapentin (NEURONTIN) 600 MG tablet TAKE 4 TABLETS AT BEDTIME  120 tablet  4  . HYDROcodone-acetaminophen (NORCO) 5-325 MG per tablet Take 1 tablet by mouth 3 (three) times daily as needed.  90 tablet  0  . sennosides-docusate sodium (SENOKOT-S) 8.6-50 MG tablet Take 2 tablets by mouth 2 (two) times daily.          Allergies  Allergen Reactions  . Aspirin     REACTION: GI bleed  . Buspirone Hcl     REACTION: unspecified  . Ciprofloxacin     REACTION: unspecified  . Codeine   . Diazepam   . Duloxetine     REACTION: sz like activity  . Ibuprofen     REACTION: GI bleed  . Oxycodone-Acetaminophen     REACTION: unspecified  . Pregabalin     REACTION: kept her up and confusion  . Propoxyphene Hcl   . Risperidone     REACTION: confusion  . Sulfonamide Derivatives     REACTION: unspecified  . Tramadol Hcl     REACTION: doesn't remember what happened but couldn't tolerate  . Venlafaxine     REACTION: unspecified    Past Medical History  Diagnosis Date  . Chronic fatigue   . Obstipation   . Depression   . Hyperlipemia   . GERD (gastroesophageal reflux disease)   .  PUD (peptic ulcer disease)   . Migraine   . Osteopenia     Past Surgical History  Procedure Date  . Appendectomy 2005  . Cholecystectomy 2004  . Total abdominal hysterectomy w/ bilateral salpingoophorectomy 1986    Family History  Problem Relation Age of Onset  . Heart disease Mother     AMI  . Stroke Mother   . Heart disease Father   . Hypertension Sister   . Hypertension Sister   . Heart disease Sister     MI  . Diabetes Sister     History   Social History  . Marital Status: Divorced    Spouse Name: N/A    Number of Children: 1  . Years of Education: N/A   Occupational History  . DISABLED    Social History Main Topics  . Smoking status: Current Everyday Smoker -- 1.0 packs/day for 40 years    Types: Cigarettes  . Smokeless tobacco: Never Used  . Alcohol Use: No  . Drug Use: No  . Sexually Active: Not on file   Other Topics Concern  . Not on file   Social History Narrative  . No narrative on file   Review of  Systems Still sleeps poorly Appetite is unchanged     Objective:   Physical Exam        Assessment & Plan:

## 2011-01-13 ENCOUNTER — Other Ambulatory Visit: Payer: Self-pay | Admitting: Internal Medicine

## 2011-01-13 NOTE — Telephone Encounter (Signed)
Okay #30 x 0 

## 2011-01-13 NOTE — Telephone Encounter (Signed)
rx sent to pharmacy by e-script  

## 2011-01-17 ENCOUNTER — Ambulatory Visit: Payer: Medicare Other | Admitting: Internal Medicine

## 2011-01-18 ENCOUNTER — Telehealth: Payer: Self-pay | Admitting: *Deleted

## 2011-01-18 MED ORDER — NORTRIPTYLINE HCL 25 MG PO CAPS: 25.0000 mg | ORAL_CAPSULE | Freq: Every day | ORAL | Status: DC

## 2011-01-18 NOTE — Telephone Encounter (Signed)
Spoke with patient and advised results   

## 2011-01-18 NOTE — Telephone Encounter (Signed)
Please have her start nortriptylline 25mg  at bedtime #30 x 3  If still no help after 2 weeks, would consider increasing the nortriptylline---she should call

## 2011-01-18 NOTE — Telephone Encounter (Signed)
Pt was told to increase her norvasc dose to 5 mg's once a day to help with her migraines.  This isn't helping and she says she needs to try something else.  Please advise.

## 2011-01-21 ENCOUNTER — Telehealth: Payer: Self-pay | Admitting: *Deleted

## 2011-01-21 NOTE — Telephone Encounter (Signed)
Spoke with patient and advised results Med list updated 

## 2011-01-21 NOTE — Telephone Encounter (Signed)
Have her stop the amlodipine altogether Please continue the nortriptylline  Update med list please

## 2011-01-21 NOTE — Telephone Encounter (Signed)
Pt states she was taking 2.5 mg's of norvasc for her headaches and was told to increase this to 5 mg's.  When she did this her ankles started swelling and she felt light headed.  She went back to taking 2.5 mg's but her ankles have continued to swell.  She is asking if she should stop taking it altogether.  Also asks if she is to continue nortriptyline.

## 2011-02-01 ENCOUNTER — Other Ambulatory Visit: Payer: Self-pay | Admitting: *Deleted

## 2011-02-01 MED ORDER — ALPRAZOLAM 1 MG PO TABS: 1.0000 mg | ORAL_TABLET | Freq: Four times a day (QID) | ORAL | Status: DC

## 2011-02-01 MED ORDER — HYDROCODONE-ACETAMINOPHEN 5-325 MG PO TABS: 1.0000 | ORAL_TABLET | Freq: Three times a day (TID) | ORAL | Status: DC | PRN

## 2011-02-01 NOTE — Telephone Encounter (Signed)
Okay #120 x 0 of alprazolam  #90 x 0 of hydrocodone

## 2011-02-01 NOTE — Telephone Encounter (Signed)
Rx called to pharmacy

## 2011-02-22 ENCOUNTER — Ambulatory Visit: Payer: Medicare Other

## 2011-02-28 ENCOUNTER — Other Ambulatory Visit: Payer: Self-pay | Admitting: Internal Medicine

## 2011-03-02 ENCOUNTER — Other Ambulatory Visit: Payer: Self-pay | Admitting: *Deleted

## 2011-03-02 MED ORDER — HYDROCODONE-ACETAMINOPHEN 5-325 MG PO TABS: 1.0000 | ORAL_TABLET | Freq: Three times a day (TID) | ORAL | Status: DC | PRN

## 2011-03-02 MED ORDER — ALPRAZOLAM 1 MG PO TABS: 1.0000 mg | ORAL_TABLET | Freq: Four times a day (QID) | ORAL | Status: DC

## 2011-03-02 NOTE — Telephone Encounter (Signed)
Okay #120 x 0 of alprazolam and #90 x 0 of the hydrocodone Please let her know that I am concerned about her consistent use and would recommend that she try to decrease a little bit (skipping some doses occasionally)

## 2011-03-02 NOTE — Telephone Encounter (Signed)
rx called into pharmacy Spoke with patient and advised results   

## 2011-03-02 NOTE — Telephone Encounter (Signed)
Last filled 02/01/11

## 2011-04-01 ENCOUNTER — Other Ambulatory Visit: Payer: Self-pay | Admitting: *Deleted

## 2011-04-01 MED ORDER — ALPRAZOLAM 1 MG PO TABS: 1.0000 mg | ORAL_TABLET | Freq: Four times a day (QID) | ORAL | Status: DC

## 2011-04-01 NOTE — Telephone Encounter (Signed)
rx called into pharmacy

## 2011-04-01 NOTE — Telephone Encounter (Signed)
Okay #120 x 0 

## 2011-04-11 ENCOUNTER — Other Ambulatory Visit: Payer: Self-pay | Admitting: Internal Medicine

## 2011-04-25 ENCOUNTER — Other Ambulatory Visit: Payer: Self-pay | Admitting: Internal Medicine

## 2011-04-25 NOTE — Telephone Encounter (Signed)
Refill denied, too soon, sent back to pharmacy

## 2011-04-25 NOTE — Telephone Encounter (Signed)
Let her know that it is too soon for a refill---not due till the end of the month

## 2011-04-27 ENCOUNTER — Other Ambulatory Visit: Payer: Self-pay | Admitting: Internal Medicine

## 2011-04-28 ENCOUNTER — Other Ambulatory Visit: Payer: Self-pay | Admitting: *Deleted

## 2011-04-28 NOTE — Telephone Encounter (Signed)
I can send her to a headache specialist but I can also see her first and discuss daily meds that can help for prevention safely.  If she was only taking the meds occ, I didn't have a problem with it----only when she is overusing it

## 2011-04-28 NOTE — Telephone Encounter (Signed)
Spoke with patient and she was not happy, stated that you knew how severe her headaches are. Pt would like to know who she can see about these headaches?

## 2011-04-28 NOTE — Telephone Encounter (Signed)
This is a highly dependence provoking medication and the more she takes the more she will want.  Her overuse now just points out that she should probably be off the med. We had a deal about not overusing her prescription meds and I will not refill this now (and likely will not anymore) Her migraines will convert to chronic headaches if she keeps up with the medicine like this

## 2011-04-28 NOTE — Telephone Encounter (Signed)
.  left message to have patient return my call.  

## 2011-04-28 NOTE — Telephone Encounter (Signed)
Patient calling because her refill for Fioricet was denied, I advised that she just received a refill on 04/11/11 and it was too soon, patient stated she's having 2 migraines a day and she's taking sometimes 4 or more tabs a day. I advised that was too much and I would have to ask Dr.Letvak, pt states she only have 2 tablets left and "it's going to be a long weekend" please advise.

## 2011-05-02 ENCOUNTER — Other Ambulatory Visit: Payer: Self-pay | Admitting: *Deleted

## 2011-05-02 MED ORDER — ALPRAZOLAM 1 MG PO TABS: 1.0000 mg | ORAL_TABLET | Freq: Four times a day (QID) | ORAL | Status: DC

## 2011-05-02 NOTE — Telephone Encounter (Signed)
rx called into pharmacy

## 2011-05-02 NOTE — Telephone Encounter (Signed)
Okay #120 x 0 

## 2011-05-09 ENCOUNTER — Other Ambulatory Visit: Payer: Self-pay | Admitting: Internal Medicine

## 2011-05-11 ENCOUNTER — Encounter: Payer: Self-pay | Admitting: Internal Medicine

## 2011-05-11 ENCOUNTER — Ambulatory Visit (INDEPENDENT_AMBULATORY_CARE_PROVIDER_SITE_OTHER): Payer: Medicare Other | Admitting: Internal Medicine

## 2011-05-11 DIAGNOSIS — F411 Generalized anxiety disorder: Secondary | ICD-10-CM

## 2011-05-11 DIAGNOSIS — IMO0001 Reserved for inherently not codable concepts without codable children: Secondary | ICD-10-CM

## 2011-05-11 DIAGNOSIS — G478 Other sleep disorders: Secondary | ICD-10-CM

## 2011-05-11 DIAGNOSIS — G479 Sleep disorder, unspecified: Secondary | ICD-10-CM

## 2011-05-11 DIAGNOSIS — G43909 Migraine, unspecified, not intractable, without status migrainosus: Secondary | ICD-10-CM

## 2011-05-11 MED ORDER — BUTALBITAL-APAP-CAFFEINE 50-325-40 MG PO TABS: 1.0000 | ORAL_TABLET | Freq: Every day | ORAL | Status: DC | PRN

## 2011-05-11 MED ORDER — NORTRIPTYLINE HCL 25 MG PO CAPS: 50.0000 mg | ORAL_CAPSULE | Freq: Every day | ORAL | Status: DC

## 2011-05-11 NOTE — Assessment & Plan Note (Signed)
Has noted the pain more as she has cut back on hydrocodone Hopefully the nortrip will help this also

## 2011-05-11 NOTE — Assessment & Plan Note (Signed)
Doing better now Will increase the nortrip

## 2011-05-11 NOTE — Assessment & Plan Note (Signed)
Still needs to take the xanax fairly regularly

## 2011-05-11 NOTE — Progress Notes (Signed)
Subjective:    Patient ID: Lindsey Ward, female    DOB: June 03, 1950, 61 y.o.   MRN: [id number]  HPI "Under the circumstances, I am doing okay" Friend diagnosed with brain cancer Daughter needed hysterectomy  Had bad headaches for a while Was overusing the fioricet Has tender area on left parietal scalp They have backed off some Taking 1 nortriptylline at night. No apparent problems Sleeps about 6 hours---- refreshed sometimes, other times she will go back to sleep for an hour after breakfast sometimes  Having some "bone issues" Left knee and elbow, along left tibia Hasn't been taking the hydrocodone Will use acetaminophen---but holding off on that also  Anxiety flares at times Uses xanax tid at times---other times saves for bedtime  Current Outpatient Prescriptions on File Prior to Visit  Medication Sig Dispense Refill  . ALPRAZolam (XANAX) 1 MG tablet Take 1 tablet (1 mg total) by mouth 4 (four) times daily.  120 tablet  0  . butalbital-acetaminophen-caffeine (FIORICET, ESGIC) 50-325-40 MG per tablet TAKE 2 TABLETS BY MOUTH AS NEEDED FOR HEADACHE.  30 tablet  0  . gabapentin (NEURONTIN) 600 MG tablet TAKE 4 TABLETS AT BEDTIME  120 tablet  4  . HYDROcodone-acetaminophen (NORCO) 5-325 MG per tablet Take 1 tablet by mouth 3 (three) times daily as needed.  90 tablet  0  . nortriptyline (PAMELOR) 25 MG capsule TAKE 1 CAPSULE (25 MG TOTAL) BY MOUTH AT BEDTIME.  30 capsule  3  . sennosides-docusate sodium (SENOKOT-S) 8.6-50 MG tablet Take 2 tablets by mouth 2 (two) times daily.          Allergies  Allergen Reactions  . Aspirin     REACTION: GI bleed  . Buspirone Hcl     REACTION: unspecified  . Ciprofloxacin     REACTION: unspecified  . Codeine   . Diazepam   . Duloxetine     REACTION: sz like activity  . Ibuprofen     REACTION: GI bleed  . Oxycodone-Acetaminophen     REACTION: unspecified  . Pregabalin     REACTION: kept her up and confusion  . Propoxyphene Hcl   .  Risperidone     REACTION: confusion  . Sulfonamide Derivatives     REACTION: unspecified  . Tramadol Hcl     REACTION: doesn't remember what happened but couldn't tolerate  . Venlafaxine     REACTION: unspecified    Past Medical History  Diagnosis Date  . Chronic fatigue   . Obstipation   . Depression   . Hyperlipemia   . GERD (gastroesophageal reflux disease)   . PUD (peptic ulcer disease)   . Migraine   . Osteopenia     Past Surgical History  Procedure Date  . Appendectomy 2005  . Cholecystectomy 2004  . Total abdominal hysterectomy w/ bilateral salpingoophorectomy 1986    Family History  Problem Relation Age of Onset  . Heart disease Mother     AMI  . Stroke Mother   . Heart disease Father   . Hypertension Sister   . Hypertension Sister   . Heart disease Sister     MI  . Diabetes Sister     History   Social History  . Marital Status: Divorced    Spouse Name: N/A    Number of Children: 1  . Years of Education: N/A   Occupational History  . DISABLED    Social History Main Topics  . Smoking status: Current Everyday Smoker -- 1.0 packs/day for  40 years    Types: Cigarettes  . Smokeless tobacco: Never Used  . Alcohol Use: No  . Drug Use: No  . Sexually Active: Not on file   Other Topics Concern  . Not on file   Social History Narrative  . No narrative on file   Review of Systems Appetite is okay Weight is stable     Objective:   Physical Exam  Constitutional: She appears well-developed and well-nourished. No distress.  Neck: Normal range of motion. Neck supple.  Cardiovascular: Normal rate, regular rhythm and normal heart sounds.  Exam reveals no gallop.   No murmur heard. Pulmonary/Chest: Effort normal and breath sounds normal. No respiratory distress. She has no wheezes. She has no rales.  Abdominal: Soft. There is no tenderness.  Musculoskeletal: She exhibits no edema and no tenderness.       No active synovitis  Lymphadenopathy:     She has no cervical adenopathy.  Psychiatric: She has a normal mood and affect. Her behavior is normal.          Assessment & Plan:

## 2011-05-11 NOTE — Assessment & Plan Note (Signed)
Discussed the chance of converting to chronic headache with too much analgesic use Will increase the amitriptylline

## 2011-05-31 ENCOUNTER — Other Ambulatory Visit: Payer: Self-pay | Admitting: *Deleted

## 2011-05-31 MED ORDER — ALPRAZOLAM 1 MG PO TABS: 1.0000 mg | ORAL_TABLET | Freq: Four times a day (QID) | ORAL | Status: DC

## 2011-05-31 NOTE — Telephone Encounter (Signed)
Okay #120 x 0 

## 2011-05-31 NOTE — Telephone Encounter (Signed)
rx called into pharmacy

## 2011-06-17 ENCOUNTER — Other Ambulatory Visit: Payer: Self-pay | Admitting: *Deleted

## 2011-06-17 MED ORDER — GABAPENTIN 600 MG PO TABS: ORAL_TABLET | ORAL | Status: DC

## 2011-06-17 MED ORDER — NORTRIPTYLINE HCL 25 MG PO CAPS: 50.0000 mg | ORAL_CAPSULE | Freq: Every day | ORAL | Status: DC

## 2011-06-17 NOTE — Telephone Encounter (Signed)
Both of these meds can be filled for a year

## 2011-06-17 NOTE — Telephone Encounter (Signed)
rx sent to pharmacy by e-script  

## 2011-06-17 NOTE — Telephone Encounter (Signed)
Patient request 90 day supply

## 2011-06-23 ENCOUNTER — Encounter: Payer: Self-pay | Admitting: *Deleted

## 2011-06-23 ENCOUNTER — Emergency Department
Admission: EM | Admit: 2011-06-23 | Discharge: 2011-06-23 | Disposition: A | Discharge status: Home / Self Care | Payer: Medicare Other | Source: Home / Self Care | Attending: Emergency Medicine | Admitting: Emergency Medicine

## 2011-06-23 DIAGNOSIS — J069 Acute upper respiratory infection, unspecified: Secondary | ICD-10-CM

## 2011-06-23 DIAGNOSIS — R82998 Other abnormal findings in urine: Secondary | ICD-10-CM

## 2011-06-23 DIAGNOSIS — N390 Urinary tract infection, site not specified: Secondary | ICD-10-CM

## 2011-06-23 DIAGNOSIS — R8290 Unspecified abnormal findings in urine: Secondary | ICD-10-CM

## 2011-06-23 LAB — POCT URINALYSIS DIP (MANUAL ENTRY)
Bilirubin, UA: NEGATIVE
Blood, UA: NEGATIVE
Glucose, UA: NEGATIVE
Ketones, POC UA: NEGATIVE
Nitrite, UA: NEGATIVE
Protein Ur, POC: NEGATIVE
Spec Grav, UA: <=1.005 (ref 1.005–1.03)
Urobilinogen, UA: 0.2 (ref 0–1)
pH, UA: 6 (ref 5–8)

## 2011-06-23 MED ORDER — SULFAMETHOXAZOLE-TRIMETHOPRIM 800-160 MG PO TABS: 1.0000 | ORAL_TABLET | Freq: Two times a day (BID) | ORAL | Status: AC

## 2011-06-23 NOTE — ED Notes (Signed)
Patient c/o malodorous urine x 2 weeks and productive cough and congestion x 3 weeks. Denies fever.

## 2011-06-23 NOTE — ED Provider Notes (Signed)
History     CSN: [id number]  Arrival date & time 06/23/11  1605   First MD Initiated Contact with Patient 06/23/11 1619      Chief Complaint  Patient presents with  . Urinary Tract Infection    malodorus  . Cough    (Consider location/radiation/quality/duration/timing/severity/associated sxs/prior treatment) HPI Lindsey Ward is a 61 y.o. female who complains of onset of cold symptoms for 3 days.  The symptoms are constant and mild-moderate in severity.  She is using Mucinex which is helping.  She's also been on 5 mg of prednisone for a couple days which seems to also be helping. No sore throat + cough No pleuritic pain No wheezing + nasal congestion + post-nasal drainage No sinus pain/pressure + chest congestion No itchy/red eyes No earache No hemoptysis No SOB No chills/sweats No fever No nausea No vomiting No abdominal pain No diarrhea No skin rashes No fatigue No myalgias No headache    Lindsey Ward is a 61 y.o. female who also presents today with UTI symptoms for 3 days.  + malodorous urine No dysuria No frequency No urgency No hematuria No vaginal discharge No fever/chills No lower abdominal pain ? L back pain No fatigue    Past Medical History  Diagnosis Date  . Chronic fatigue   . Obstipation   . Depression   . Hyperlipemia   . GERD (gastroesophageal reflux disease)   . PUD (peptic ulcer disease)   . Migraine   . Osteopenia     Past Surgical History  Procedure Date  . Appendectomy 2005  . Cholecystectomy 2004  . Total abdominal hysterectomy w/ bilateral salpingoophorectomy 1986    Family History  Problem Relation Age of Onset  . Heart disease Mother     AMI  . Stroke Mother   . Heart disease Father   . Hypertension Sister   . Hypertension Sister   . Heart disease Sister     MI  . Diabetes Sister     History  Substance Use Topics  . Smoking status: Current Everyday Smoker -- 1.0 packs/day for 40 years    Types: Cigarettes  .  Smokeless tobacco: Never Used   Comment: has cut down to ~6 cigarettes per day  . Alcohol Use: No    OB History    Grav Para Term Preterm Abortions TAB SAB Ect Mult Living                  Review of Systems  All other systems reviewed and are negative.    Allergies  Aspirin; Buspirone hcl; Ciprofloxacin; Codeine; Diazepam; Duloxetine; Ibuprofen; Oxycodone-acetaminophen; Pregabalin; Propoxyphene hcl; Risperidone; Sulfonamide derivatives; Tramadol hcl; and Venlafaxine  Home Medications   Current Outpatient Rx  Name Route Sig Dispense Refill  . BUTALBITAL-APAP-CAFF-COD 50-325-40-30 MG PO CAPS Oral Take 1 capsule by mouth every 4 (four) hours as needed.    . ALPRAZOLAM 1 MG PO TABS Oral Take 1 tablet (1 mg total) by mouth 4 (four) times daily. 120 tablet 0  . BUTALBITAL-APAP-CAFFEINE 50-325-40 MG PO TABS Oral Take 1-2 tablets by mouth daily as needed for headache. 30 tablet 0  . GABAPENTIN 600 MG PO TABS  TAKE 4 TABLETS AT BEDTIME 360 tablet 4  . HYDROCODONE-ACETAMINOPHEN 5-325 MG PO TABS Oral Take 1 tablet by mouth 3 (three) times daily as needed. 90 tablet 0  . NORTRIPTYLINE HCL 25 MG PO CAPS Oral Take 2 capsules (50 mg total) by mouth at bedtime. 180 capsule 5  . SENNA-DOCUSATE SODIUM  8.6-50 MG PO TABS Oral Take 2 tablets by mouth 2 (two) times daily.      . SULFAMETHOXAZOLE-TRIMETHOPRIM 800-160 MG PO TABS Oral Take 1 tablet by mouth 2 (two) times daily. 14 tablet 0    BP 90/51  Pulse 76  Temp(Src) 98.8 F (37.1 C) (Oral)  Resp 14  Ht 5\' 3"  (1.6 m)  Wt 115 lb (52.164 kg)  BMI 20.37 kg/m2  SpO2 96%  Physical Exam  Nursing note and vitals reviewed. Constitutional: She is oriented to person, place, and time. She appears well-developed and well-nourished.  HENT:  Head: Normocephalic and atraumatic.  Right Ear: Tympanic membrane, external ear and ear canal normal.  Left Ear: Tympanic membrane, external ear and ear canal normal.  Nose: Nose normal.  Mouth/Throat: No  oropharyngeal exudate or posterior oropharyngeal edema.  Eyes: No scleral icterus.  Neck: Neck supple.  Cardiovascular: Regular rhythm and normal heart sounds.   Pulmonary/Chest: Effort normal and breath sounds normal. No respiratory distress. She has no decreased breath sounds. She has no wheezes. She has no rhonchi.  Abdominal: Soft. Normal appearance and bowel sounds are normal. She exhibits no mass. There is no tenderness. There is no rebound, no guarding and no CVA tenderness.  Neurological: She is alert and oriented to person, place, and time.  Skin: Skin is warm and dry. No rash noted.  Psychiatric: She has a normal mood and affect. Her speech is normal.    ED Course  Procedures (including critical care time)   Labs Reviewed  POCT URINALYSIS DIP (MANUAL ENTRY)  URINE CULTURE   No results found.   1. Urine malodor   2. Acute upper respiratory infections of unspecified site   3. Urinary tract infection, site not specified       MDM  1)  Take the prescribed antibiotic as instructed. 2)  Use nasal saline solution (over the counter) at least 3 times a day. 3)  Use over the counter decongestants like Zyrtec-D every 12 hours as needed to help with congestion.  If you have hypertension, do not take medicines with sudafed.  4)  Can take tylenol every 6 hours or motrin every 8 hours for pain or fever. 5)  Follow up with your primary doctor if no improvement in 5-7 days, sooner if increasing pain, fever, or new symptoms.     1) Take the prescribed antibiotic as directed.  Bactrim to cover both a urinary infection and a possible mild upper respiratory infection as well.  She states that she is allergic to Bactrim and Cipro however the allergy is a bloating and was all in the remote past she states that she has no serious reaction with it so we have decided to try it.  If she has any problems and she is to call us back. 2) A urinalysis was done in clinic.  A urine culture is  pending. 3) Follow up with your PCP or urologist if not improving or if worsening symptoms.    Marlaine Hind, MD 06/23/11 1640

## 2011-06-27 ENCOUNTER — Telehealth: Payer: Self-pay | Admitting: Emergency Medicine

## 2011-06-27 LAB — URINE CULTURE: Colony Count: >=100000

## 2011-07-12 ENCOUNTER — Other Ambulatory Visit: Payer: Self-pay | Admitting: *Deleted

## 2011-07-12 MED ORDER — ALPRAZOLAM 1 MG PO TABS: 1.0000 mg | ORAL_TABLET | Freq: Four times a day (QID) | ORAL | Status: DC

## 2011-07-12 NOTE — Telephone Encounter (Signed)
rx called into pharmacy

## 2011-07-12 NOTE — Telephone Encounter (Signed)
Okay #120 x 0 

## 2011-07-12 NOTE — Telephone Encounter (Signed)
Last filled 05-31-2011

## 2011-08-10 ENCOUNTER — Other Ambulatory Visit: Payer: Self-pay | Admitting: *Deleted

## 2011-08-10 MED ORDER — ALPRAZOLAM 1 MG PO TABS: 1.0000 mg | ORAL_TABLET | Freq: Four times a day (QID) | ORAL | Status: DC

## 2011-08-10 NOTE — Telephone Encounter (Signed)
rx called into pharmacy

## 2011-08-10 NOTE — Telephone Encounter (Signed)
Okay #120 x 0 

## 2011-09-08 ENCOUNTER — Ambulatory Visit (INDEPENDENT_AMBULATORY_CARE_PROVIDER_SITE_OTHER): Payer: Medicare Other | Admitting: Internal Medicine

## 2011-09-08 ENCOUNTER — Encounter: Payer: Self-pay | Admitting: Internal Medicine

## 2011-09-08 VITALS — BP 120/80 | HR 65 | Temp 97.6°F | Ht 63.0 in | Wt 117.0 lb

## 2011-09-08 DIAGNOSIS — G479 Sleep disorder, unspecified: Secondary | ICD-10-CM

## 2011-09-08 DIAGNOSIS — F411 Generalized anxiety disorder: Secondary | ICD-10-CM

## 2011-09-08 DIAGNOSIS — Z0000 Encounter for general adult medical examination without abnormal findings: Secondary | ICD-10-CM

## 2011-09-08 DIAGNOSIS — IMO0001 Reserved for inherently not codable concepts without codable children: Secondary | ICD-10-CM

## 2011-09-08 MED ORDER — HYDROCODONE-ACETAMINOPHEN 5-325 MG PO TABS: 1.0000 | ORAL_TABLET | Freq: Three times a day (TID) | ORAL | Status: DC | PRN

## 2011-09-08 MED ORDER — ALPRAZOLAM 1 MG PO TABS: 1.0000 mg | ORAL_TABLET | Freq: Four times a day (QID) | ORAL | Status: DC

## 2011-09-08 NOTE — Assessment & Plan Note (Signed)
Sounds like she slept better with higher dose nortriptylline but felt it affected her at 5PM No changes for now

## 2011-09-08 NOTE — Assessment & Plan Note (Signed)
Ongoing and fairly severe Uses hydrocodone as needed Gabapentin and nortriptylline

## 2011-09-08 NOTE — Progress Notes (Signed)
Subjective:    Patient ID: Lindsey Ward, female    DOB: Mar 27, 1950, 61 y.o.   MRN: [id number]  HPI Here for physical Has had 2 bad days with fibromyalgia---mostly around hips and buttocks Increased the nortriptylline to 50mg  nightly Noted a "crashing point" around 4-5PM so went back to 25mg  nightly  Headaches are better now Gets some early symptoms but hasn't been getting the severe headaches much  Episode about a month ago where neck was stuck to left in AM Needed to lift it with her hands This is better now  Anxiety persists Has panic attacks at times Regular with the alprazolam  Current Outpatient Prescriptions on File Prior to Visit  Medication Sig Dispense Refill  . ALPRAZolam (XANAX) 1 MG tablet Take 1 tablet (1 mg total) by mouth 4 (four) times daily.  120 tablet  0  . butalbital-acetaminophen-caffeine (FIORICET, ESGIC) 50-325-40 MG per tablet Take 1-2 tablets by mouth daily as needed for headache.  30 tablet  0  . gabapentin (NEURONTIN) 600 MG tablet TAKE 4 TABLETS AT BEDTIME  360 tablet  4  . HYDROcodone-acetaminophen (NORCO) 5-325 MG per tablet Take 1 tablet by mouth 3 (three) times daily as needed.  90 tablet  0  . sennosides-docusate sodium (SENOKOT-S) 8.6-50 MG tablet Take 2 tablets by mouth 2 (two) times daily.        Marland Kitchen DISCONTD: nortriptyline (PAMELOR) 25 MG capsule Take 2 capsules (50 mg total) by mouth at bedtime.  180 capsule  5    Allergies  Allergen Reactions  . Aspirin     REACTION: GI bleed  . Buspirone Hcl     REACTION: unspecified  . Ciprofloxacin     REACTION: unspecified  . Codeine   . Diazepam   . Duloxetine     REACTION: sz like activity  . Ibuprofen     REACTION: GI bleed  . Oxycodone-Acetaminophen     REACTION: unspecified  . Pregabalin     REACTION: kept her up and confusion  . Propoxyphene Hcl   . Risperidone     REACTION: confusion  . Sulfonamide Derivatives     REACTION: unspecified  . Tramadol Hcl     REACTION: doesn't  remember what happened but couldn't tolerate  . Venlafaxine     REACTION: unspecified    Past Medical History  Diagnosis Date  . Chronic fatigue   . Obstipation   . Depression   . Hyperlipemia   . GERD (gastroesophageal reflux disease)   . PUD (peptic ulcer disease)   . Migraine   . Osteopenia     Past Surgical History  Procedure Date  . Appendectomy 2005  . Cholecystectomy 2004  . Total abdominal hysterectomy w/ bilateral salpingoophorectomy 1986    Family History  Problem Relation Age of Onset  . Heart disease Mother     AMI  . Stroke Mother   . Heart disease Father   . Hypertension Sister   . Hypertension Sister   . Heart disease Sister     MI  . Diabetes Sister     History   Social History  . Marital Status: Divorced    Spouse Name: N/A    Number of Children: 1  . Years of Education: N/A   Occupational History  . DISABLED    Social History Main Topics  . Smoking status: Current Everyday Smoker -- 1.0 packs/day for 40 years    Types: Cigarettes  . Smokeless tobacco: Never Used   Comment: has  cut down to ~6 cigarettes per day  . Alcohol Use: No  . Drug Use: No  . Sexually Active: Not on file   Other Topics Concern  . Not on file   Social History Narrative  . No narrative on file   Review of Systems  Constitutional: Negative for fatigue and unexpected weight change.       Wears seat belt  HENT: Negative for hearing loss, congestion, rhinorrhea, dental problem and tinnitus.        Regular with dentist  Eyes: Negative for visual disturbance.       No diplopia or unilateral vision loss Has vision check upcoming---does note "like a film over them"  Respiratory: Negative for cough, chest tightness and shortness of breath.   Cardiovascular: Positive for chest pain.       Slight chest discomfort--relates to fibromyalgia  Gastrointestinal: Positive for constipation. Negative for nausea, abdominal pain and blood in stool.       Occ heartburn    Genitourinary: Negative for dysuria and difficulty urinating.       No sex--no problem  Musculoskeletal: Positive for back pain and arthralgias. Negative for joint swelling.  Skin: Negative for pallor and rash.       No suspicious lesions  Neurological: Positive for dizziness, weakness and headaches. Negative for syncope and light-headedness.       Occ mild dizziness if gets up quickly (or turns too fast)  Hematological: Negative for adenopathy. Does not bruise/bleed easily.  Psychiatric/Behavioral: Positive for disturbed wake/sleep cycle. Negative for dysphoric mood. The patient is nervous/anxious.        Objective:   Physical Exam  Constitutional: She is oriented to person, place, and time. She appears well-developed and well-nourished. No distress.  HENT:  Head: Normocephalic and atraumatic.  Right Ear: External ear normal.  Left Ear: External ear normal.  Mouth/Throat: Oropharynx is clear and moist. No oropharyngeal exudate.  Eyes: Conjunctivae and EOM are normal. Pupils are equal, round, and reactive to light.  Neck: Normal range of motion. Neck supple. No thyromegaly present.  Cardiovascular: Normal rate, regular rhythm, normal heart sounds and intact distal pulses.  Exam reveals no gallop.   No murmur heard. Pulmonary/Chest: Effort normal and breath sounds normal. No respiratory distress. She has no wheezes. She has no rales.  Abdominal: Soft. There is no tenderness.  Genitourinary:       Cystic changes in right breast Left fairly normal  Musculoskeletal: Normal range of motion. She exhibits no edema.  Lymphadenopathy:    She has no cervical adenopathy.    She has no axillary adenopathy.  Neurological: She is alert and oriented to person, place, and time.  Skin: No rash noted. No erythema.  Psychiatric: Her behavior is normal.       Usual mild anxiety          Assessment & Plan:

## 2011-09-08 NOTE — Assessment & Plan Note (Signed)
Does okay with regular xanax

## 2011-09-08 NOTE — Assessment & Plan Note (Signed)
UTD on imms and colonoscopy Wants to continue yearly mammos due to sister's history---due end of August

## 2011-09-09 LAB — CBC WITH DIFFERENTIAL/PLATELET
Basophils Absolute: 0 10*3/uL (ref 0.0–0.1)
Basophils Relative: 0.1 % (ref 0.0–3.0)
Eosinophils Absolute: 0.1 10*3/uL (ref 0.0–0.7)
Eosinophils Relative: 1.5 % (ref 0.0–5.0)
HCT: 37.5 % (ref 36.0–46.0)
Hemoglobin: 12.7 g/dL (ref 12.0–15.0)
Lymphocytes Relative: 25.3 % (ref 12.0–46.0)
Lymphs Abs: 2 10*3/uL (ref 0.7–4.0)
MCHC: 33.7 g/dL (ref 30.0–36.0)
MCV: 96.6 fl (ref 78.0–100.0)
Monocytes Absolute: 0.3 10*3/uL (ref 0.1–1.0)
Monocytes Relative: 3.6 % (ref 3.0–12.0)
Neutro Abs: 5.6 10*3/uL (ref 1.4–7.7)
Neutrophils Relative %: 69.5 % (ref 43.0–77.0)
Platelets: 257 10*3/uL (ref 150.0–400.0)
RBC: 3.89 Mil/uL (ref 3.87–5.11)
RDW: 12.4 % (ref 11.5–14.6)
WBC: 8 10*3/uL (ref 4.5–10.5)

## 2011-09-09 LAB — BASIC METABOLIC PANEL
BUN: 11 mg/dL (ref 6–23)
CO2: 30 mEq/L (ref 19–32)
Calcium: 9.7 mg/dL (ref 8.4–10.5)
Chloride: 102 mEq/L (ref 96–112)
Creatinine, Ser: 0.9 mg/dL (ref 0.4–1.2)
GFR: 68.97 mL/min (ref >60.00)
Glucose, Bld: 92 mg/dL (ref 70–99)
Potassium: 4.5 mEq/L (ref 3.5–5.1)
Sodium: 139 mEq/L (ref 135–145)

## 2011-09-09 LAB — HEPATIC FUNCTION PANEL
ALT: 15 U/L (ref 0–35)
AST: 24 U/L (ref 0–37)
Albumin: 4.4 g/dL (ref 3.5–5.2)
Alkaline Phosphatase: 98 U/L (ref 39–117)
Bilirubin, Direct: 0 mg/dL (ref 0.0–0.3)
Total Bilirubin: 0.3 mg/dL (ref 0.3–1.2)
Total Protein: 7.2 g/dL (ref 6.0–8.3)

## 2011-09-09 LAB — TSH: TSH: 1.13 u[IU]/mL (ref 0.35–5.50)

## 2011-09-12 ENCOUNTER — Encounter: Payer: Self-pay | Admitting: *Deleted

## 2011-10-07 ENCOUNTER — Other Ambulatory Visit: Payer: Self-pay | Admitting: *Deleted

## 2011-10-07 NOTE — Telephone Encounter (Signed)
Lindsey Ward 

## 2011-10-08 MED ORDER — ALPRAZOLAM 1 MG PO TABS: 1.0000 mg | ORAL_TABLET | Freq: Four times a day (QID) | ORAL | Status: DC

## 2011-10-08 NOTE — Telephone Encounter (Signed)
plz phone in. 

## 2011-10-10 NOTE — Telephone Encounter (Signed)
rx called into pharmacy

## 2011-11-10 ENCOUNTER — Other Ambulatory Visit: Payer: Self-pay | Admitting: *Deleted

## 2011-11-10 MED ORDER — ALPRAZOLAM 1 MG PO TABS: 1.0000 mg | ORAL_TABLET | Freq: Four times a day (QID) | ORAL | Status: DC

## 2011-11-10 NOTE — Telephone Encounter (Signed)
OK to refill? Last filled 10/07/11.

## 2011-11-10 NOTE — Telephone Encounter (Signed)
rx called into pharmacy

## 2011-11-10 NOTE — Telephone Encounter (Signed)
Okay #120 x 0 

## 2011-12-08 ENCOUNTER — Other Ambulatory Visit: Payer: Self-pay | Admitting: *Deleted

## 2011-12-08 NOTE — Telephone Encounter (Signed)
Last filled 11/10/2011

## 2011-12-09 MED ORDER — ALPRAZOLAM 1 MG PO TABS: 1.0000 mg | ORAL_TABLET | Freq: Four times a day (QID) | ORAL | Status: DC

## 2011-12-09 NOTE — Telephone Encounter (Signed)
rx called into pharmacy

## 2011-12-09 NOTE — Telephone Encounter (Signed)
Okay #120 x 0 

## 2011-12-20 ENCOUNTER — Other Ambulatory Visit: Payer: Self-pay | Admitting: Internal Medicine

## 2011-12-20 DIAGNOSIS — Z1231 Encounter for screening mammogram for malignant neoplasm of breast: Secondary | ICD-10-CM

## 2012-01-08 ENCOUNTER — Other Ambulatory Visit: Payer: Self-pay | Admitting: Internal Medicine

## 2012-01-09 NOTE — Telephone Encounter (Signed)
rx called into pharmacy

## 2012-01-09 NOTE — Telephone Encounter (Signed)
Okay #120 x 0 

## 2012-01-15 ENCOUNTER — Other Ambulatory Visit: Payer: Self-pay | Admitting: Internal Medicine

## 2012-01-19 ENCOUNTER — Ambulatory Visit
Admission: RE | Admit: 2012-01-19 | Discharge: 2012-01-19 | Disposition: A | Discharge status: Home / Self Care | Payer: Medicare Other | Source: Ambulatory Visit | Attending: Internal Medicine | Admitting: Internal Medicine

## 2012-01-19 DIAGNOSIS — Z1231 Encounter for screening mammogram for malignant neoplasm of breast: Secondary | ICD-10-CM

## 2012-01-20 ENCOUNTER — Encounter: Payer: Self-pay | Admitting: *Deleted

## 2012-02-09 ENCOUNTER — Other Ambulatory Visit: Payer: Self-pay | Admitting: Internal Medicine

## 2012-02-10 NOTE — Telephone Encounter (Signed)
LETVAK PATIENT 

## 2012-02-10 NOTE — Telephone Encounter (Signed)
rx called into pharmacy

## 2012-03-08 ENCOUNTER — Other Ambulatory Visit: Payer: Self-pay | Admitting: Internal Medicine

## 2012-03-08 NOTE — Telephone Encounter (Signed)
Received faxed refill request

## 2012-03-09 ENCOUNTER — Ambulatory Visit (INDEPENDENT_AMBULATORY_CARE_PROVIDER_SITE_OTHER): Payer: Medicare Other | Admitting: Internal Medicine

## 2012-03-09 ENCOUNTER — Encounter: Payer: Self-pay | Admitting: Internal Medicine

## 2012-03-09 ENCOUNTER — Other Ambulatory Visit: Payer: Self-pay | Admitting: *Deleted

## 2012-03-09 VITALS — BP 110/70 | HR 75 | Temp 97.5°F | Wt 115.4 lb

## 2012-03-09 DIAGNOSIS — R300 Dysuria: Secondary | ICD-10-CM

## 2012-03-09 DIAGNOSIS — F411 Generalized anxiety disorder: Secondary | ICD-10-CM

## 2012-03-09 DIAGNOSIS — IMO0001 Reserved for inherently not codable concepts without codable children: Secondary | ICD-10-CM

## 2012-03-09 DIAGNOSIS — G479 Sleep disorder, unspecified: Secondary | ICD-10-CM

## 2012-03-09 DIAGNOSIS — R8290 Unspecified abnormal findings in urine: Secondary | ICD-10-CM

## 2012-03-09 DIAGNOSIS — R82998 Other abnormal findings in urine: Secondary | ICD-10-CM

## 2012-03-09 LAB — POCT URINALYSIS DIPSTICK
Bilirubin, UA: NEGATIVE
Blood, UA: NEGATIVE
Glucose, UA: NEGATIVE
Ketones, UA: NEGATIVE
Leukocytes, UA: NEGATIVE
Nitrite, UA: POSITIVE
Spec Grav, UA: 1.015
Urobilinogen, UA: NEGATIVE
pH, UA: 6

## 2012-03-09 MED ORDER — HYDROCODONE-ACETAMINOPHEN 5-325 MG PO TABS: 1.0000 | ORAL_TABLET | Freq: Three times a day (TID) | ORAL | Status: DC | PRN

## 2012-03-09 MED ORDER — BUTALBITAL-APAP-CAFFEINE 50-325-40 MG PO TABS: 1.0000 | ORAL_TABLET | Freq: Every day | ORAL | Status: DC | PRN

## 2012-03-09 MED ORDER — CEFUROXIME AXETIL 250 MG PO TABS: 250.0000 mg | ORAL_TABLET | Freq: Two times a day (BID) | ORAL | Status: DC

## 2012-03-09 MED ORDER — ALPRAZOLAM 1 MG PO TABS: 1.0000 mg | ORAL_TABLET | Freq: Four times a day (QID) | ORAL | Status: DC | PRN

## 2012-03-09 NOTE — Assessment & Plan Note (Signed)
Some ongoing issues but better, it seems, with the nortriptyline

## 2012-03-09 NOTE — Assessment & Plan Note (Signed)
No clear cystitis symptoms Advised her to increase fluids Will treat with 3 days of ceftin

## 2012-03-09 NOTE — Progress Notes (Signed)
Subjective:    Patient ID: Lindsey Ward, female    DOB: [birth year], 61 y.o.   MRN: [id number]  HPI Has had bad urine smell for a while Occ soreness along right back No frequency or urgency No dysuria or hematuria  Steffanie Rainwater recently passed away She was giving him care Now living in [place] Has been grieving --"I am just going around in circles" Hard to concentrate, etc  Fibromyalgia has been active Using the hydrocodone regularly Using heating pads also--this has helped Still using the nortriptylline at bedtime Wonders if it is causing her hands to be stiff and cramped in AM---takes a while to extend fingers  Gets occ migraine Uses the fioricet rarely  Current Outpatient Prescriptions on File Prior to Visit  Medication Sig Dispense Refill  . ALPRAZolam (XANAX) 1 MG tablet Take 1 tablet (1 mg total) by mouth 4 (four) times daily as needed.  120 tablet  0  . gabapentin (NEURONTIN) 600 MG tablet TAKE 4 TABLETS AT BEDTIME  360 tablet  4  . HYDROcodone-acetaminophen (NORCO/VICODIN) 5-325 MG per tablet Take 1 tablet by mouth 3 (three) times daily as needed.  90 tablet  0  . nortriptyline (PAMELOR) 25 MG capsule TAKE 2 CAPSULES (50 MG TOTAL) BY MOUTH AT BEDTIME.  60 capsule  5  . sennosides-docusate sodium (SENOKOT-S) 8.6-50 MG tablet Take 2 tablets by mouth 2 (two) times daily.          Allergies  Allergen Reactions  . Aspirin     REACTION: GI bleed  . Buspirone Hcl     REACTION: unspecified  . Ciprofloxacin     REACTION: unspecified  . Codeine   . Diazepam   . Duloxetine     REACTION: sz like activity  . Ibuprofen     REACTION: GI bleed  . Oxycodone-Acetaminophen     REACTION: unspecified  . Pregabalin     REACTION: kept her up and confusion  . Propoxyphene Hcl   . Risperidone     REACTION: confusion  . Sulfonamide Derivatives     REACTION: unspecified  . Tramadol Hcl     REACTION: doesn't remember what happened but couldn't tolerate  . Venlafaxine     REACTION:  unspecified    Past Medical History  Diagnosis Date  . Chronic fatigue   . Obstipation   . Depression   . Hyperlipemia   . GERD (gastroesophageal reflux disease)   . PUD (peptic ulcer disease)   . Migraine   . Osteopenia     Past Surgical History  Procedure Date  . Appendectomy 2005  . Cholecystectomy 2004  . Total abdominal hysterectomy w/ bilateral salpingoophorectomy 1986    Family History  Problem Relation Age of Onset  . Heart disease Mother     AMI  . Stroke Mother   . Heart disease Father   . Hypertension Sister   . Hypertension Sister   . Heart disease Sister     MI  . Diabetes Sister     History   Social History  . Marital Status: Divorced    Spouse Name: N/A    Number of Children: 1  . Years of Education: N/A   Occupational History  . DISABLED    Social History Main Topics  . Smoking status: Current Every Day Smoker -- 1.0 packs/day for 40 years    Types: Cigarettes  . Smokeless tobacco: Never Used     Comment: has cut down to ~6 cigarettes per day  .  Alcohol Use: No  . Drug Use: No  . Sexually Active: Not on file   Other Topics Concern  . Not on file   Social History Narrative  . No narrative on file   Review of Systems Appetite is poor Weight stable though Bowels have been okay     Objective:   Physical Exam  Constitutional: She appears well-developed and well-nourished. No distress.  Neck: Normal range of motion. Neck supple. No thyromegaly present.  Cardiovascular: Normal rate, regular rhythm, normal heart sounds and intact distal pulses.  Exam reveals no gallop.   No murmur heard. Pulmonary/Chest: Effort normal and breath sounds normal. No respiratory distress. She has no wheezes. She has no rales.  Abdominal: Soft. There is no tenderness.  Musculoskeletal: She exhibits no edema and no tenderness.  Lymphadenopathy:    She has no cervical adenopathy.  Psychiatric: She has a normal mood and affect. Her behavior is normal.           Assessment & Plan:

## 2012-03-09 NOTE — Telephone Encounter (Signed)
Okay #120 x 0 of alprazolam and #90 x 0 of hydrocodone

## 2012-03-09 NOTE — Assessment & Plan Note (Signed)
Some worse with recent deaths (boyfriend and sister's husband) Continues on the alprazolam

## 2012-03-09 NOTE — Telephone Encounter (Signed)
rx called into pharmacy

## 2012-03-09 NOTE — Assessment & Plan Note (Signed)
Ongoing pain issues Uses the nortriptyline at night Regular hydrocodone

## 2012-04-10 ENCOUNTER — Other Ambulatory Visit: Payer: Self-pay | Admitting: Internal Medicine

## 2012-04-10 NOTE — Telephone Encounter (Signed)
Okay #120 x 0 

## 2012-04-10 NOTE — Telephone Encounter (Signed)
rx called into pharmacy

## 2012-04-19 ENCOUNTER — Other Ambulatory Visit: Payer: Self-pay | Admitting: Internal Medicine

## 2012-04-19 NOTE — Telephone Encounter (Signed)
Okay #30 x 0 

## 2012-04-20 NOTE — Telephone Encounter (Signed)
rx called into pharmacy

## 2012-05-10 ENCOUNTER — Other Ambulatory Visit: Payer: Self-pay | Admitting: Internal Medicine

## 2012-05-10 NOTE — Telephone Encounter (Signed)
Okay #120 x 0 

## 2012-05-11 NOTE — Telephone Encounter (Signed)
rx called into pharmacy

## 2012-06-07 ENCOUNTER — Other Ambulatory Visit: Payer: Self-pay | Admitting: Internal Medicine

## 2012-06-07 NOTE — Telephone Encounter (Signed)
Okay alprazolam #120 x 0 Hydrocodone #90 x 0

## 2012-06-07 NOTE — Telephone Encounter (Signed)
rx called into pharmacy

## 2012-07-05 ENCOUNTER — Other Ambulatory Visit: Payer: Self-pay | Admitting: Internal Medicine

## 2012-07-06 ENCOUNTER — Telehealth: Payer: Self-pay | Admitting: Internal Medicine

## 2012-07-06 ENCOUNTER — Telehealth: Payer: Self-pay

## 2012-07-06 DIAGNOSIS — IMO0001 Reserved for inherently not codable concepts without codable children: Secondary | ICD-10-CM

## 2012-07-06 NOTE — Telephone Encounter (Signed)
Referral made 

## 2012-07-06 NOTE — Telephone Encounter (Signed)
Pt wants a referral to a RA dr due to her fibromyalgia. Someone recommended [facility] in [place] to her or she will take your recommendation if you know someone better. Can you refer her or does she need to make an apptmt w/you first? Thank you.

## 2012-07-06 NOTE — Telephone Encounter (Signed)
rx called into pharmacy

## 2012-07-06 NOTE — Telephone Encounter (Signed)
Okay #120 x 0 for alprazolam #90 x 0 for hydrocodone

## 2012-07-06 NOTE — Telephone Encounter (Signed)
Noted. Thanks for the follow-up.

## 2012-07-06 NOTE — Telephone Encounter (Signed)
Pt left v/m; pt has been taking hydrocodone-apap for body pain due to fibromyalgia; Now pt is getting h/a and wants to know if ok to take Fiorcet for h/a. Called pt back and pt said spoke with pharmacist and was told OK to take Fioricet also. Pt took Hydrocodone apap 3-325 mg at 11 AM today and pt took Fioricet around 12:45 pm. H/a is better now. Just wanted Dr Alphonsus Sias to know.

## 2012-07-24 ENCOUNTER — Other Ambulatory Visit: Payer: Self-pay | Admitting: Internal Medicine

## 2012-07-24 NOTE — Telephone Encounter (Signed)
Medication phoned to pharmacy.  

## 2012-07-24 NOTE — Telephone Encounter (Signed)
Okay #30 x 0 

## 2012-08-07 ENCOUNTER — Other Ambulatory Visit: Payer: Self-pay | Admitting: Internal Medicine

## 2012-08-07 NOTE — Telephone Encounter (Signed)
rx called into pharmacy

## 2012-08-07 NOTE — Telephone Encounter (Signed)
Okay #90 x 0 for hydrocodone and #120 x 0 for alprazolam

## 2012-08-10 ENCOUNTER — Telehealth: Payer: Self-pay | Admitting: Internal Medicine

## 2012-08-10 NOTE — Telephone Encounter (Signed)
Pt seen recently at Rheumatologist and prescribed Mobic for joint inflammation. Asking it is safe to use Biofreeze in tandem for sx relief. Advised no drug interaction per [facility] but advised that she should call Rheumatologist that  prescribed the Mobic to make sure they were ok with pt using this product. Pt would also like Dr. Karle Starch opinion.

## 2012-08-10 NOTE — Telephone Encounter (Signed)
I don't have any problem with her using the biofreeze

## 2012-08-10 NOTE — Telephone Encounter (Signed)
Spoke with patient and advised results   

## 2012-08-15 ENCOUNTER — Other Ambulatory Visit: Payer: Self-pay | Admitting: Internal Medicine

## 2012-08-15 MED ORDER — NORTRIPTYLINE HCL 25 MG PO CAPS: 50.0000 mg | ORAL_CAPSULE | Freq: Every day | ORAL | Status: DC

## 2012-08-15 NOTE — Telephone Encounter (Signed)
Okay gabapentin for a year  Let her know that the butalbitol is a little early  Okay #30 x 0 but make sure she makes this last Used regularly it can really cause dependence and withdrawal headaches

## 2012-08-15 NOTE — Telephone Encounter (Signed)
Spoke with patient and advised results Pt states she asked for a refill early because she's going out of town,

## 2012-09-06 ENCOUNTER — Other Ambulatory Visit: Payer: Self-pay | Admitting: Internal Medicine

## 2012-09-06 NOTE — Telephone Encounter (Signed)
Okay alprazolam #120 x 0 And hydrocodone #90 x 0

## 2012-09-06 NOTE — Telephone Encounter (Signed)
Pt left v/m pt said [facility] said for pt to call Dr Karle Starch office to request refill on alprazolam and hydrocodone apap a day early.Please advise.

## 2012-09-07 NOTE — Telephone Encounter (Signed)
rx called into pharmacy

## 2012-09-11 ENCOUNTER — Encounter: Payer: Self-pay | Admitting: Radiology

## 2012-09-12 ENCOUNTER — Encounter: Payer: Medicare Other | Admitting: Internal Medicine

## 2012-10-04 ENCOUNTER — Other Ambulatory Visit: Payer: Self-pay | Admitting: Internal Medicine

## 2012-10-05 NOTE — Telephone Encounter (Signed)
rx called into pharmacy Med list updated .left message to have patient return my call.

## 2012-10-05 NOTE — Telephone Encounter (Signed)
Okay alprazolam #120 x 0 Hydrocodone #90 x 0  Each should be PRN  Please remind her that I would like her to try to cut back on these and not take the full allowed amount daily as she is on a lot of controlled meds

## 2012-10-09 ENCOUNTER — Encounter: Payer: Self-pay | Admitting: Radiology

## 2012-10-10 ENCOUNTER — Encounter: Payer: Self-pay | Admitting: Internal Medicine

## 2012-10-10 ENCOUNTER — Ambulatory Visit (INDEPENDENT_AMBULATORY_CARE_PROVIDER_SITE_OTHER): Payer: Medicare Other | Admitting: Internal Medicine

## 2012-10-10 VITALS — BP 110/70 | HR 60 | Temp 97.8°F | Ht 63.0 in | Wt 115.0 lb

## 2012-10-10 DIAGNOSIS — E785 Hyperlipidemia, unspecified: Secondary | ICD-10-CM

## 2012-10-10 DIAGNOSIS — F411 Generalized anxiety disorder: Secondary | ICD-10-CM

## 2012-10-10 DIAGNOSIS — IMO0001 Reserved for inherently not codable concepts without codable children: Secondary | ICD-10-CM

## 2012-10-10 DIAGNOSIS — Z0000 Encounter for general adult medical examination without abnormal findings: Secondary | ICD-10-CM

## 2012-10-10 DIAGNOSIS — F329 Major depressive disorder, single episode, unspecified: Secondary | ICD-10-CM

## 2012-10-10 LAB — BASIC METABOLIC PANEL
BUN: 12 mg/dL (ref 6–23)
CO2: 30 mEq/L (ref 19–32)
Calcium: 9.9 mg/dL (ref 8.4–10.5)
Chloride: 101 mEq/L (ref 96–112)
Creatinine, Ser: 0.7 mg/dL (ref 0.4–1.2)
GFR: 88.05 mL/min (ref >60.00)
Glucose, Bld: 89 mg/dL (ref 70–99)
Potassium: 4.7 mEq/L (ref 3.5–5.1)
Sodium: 138 mEq/L (ref 135–145)

## 2012-10-10 LAB — CBC WITH DIFFERENTIAL/PLATELET
Basophils Absolute: 0 10*3/uL (ref 0.0–0.1)
Basophils Relative: 0.4 % (ref 0.0–3.0)
Eosinophils Absolute: 0.2 10*3/uL (ref 0.0–0.7)
Eosinophils Relative: 2 % (ref 0.0–5.0)
HCT: 36 % (ref 36.0–46.0)
Hemoglobin: 12.3 g/dL (ref 12.0–15.0)
Lymphocytes Relative: 25.8 % (ref 12.0–46.0)
Lymphs Abs: 2 10*3/uL (ref 0.7–4.0)
MCHC: 34 g/dL (ref 30.0–36.0)
MCV: 95.9 fl (ref 78.0–100.0)
Monocytes Absolute: 0.6 10*3/uL (ref 0.1–1.0)
Monocytes Relative: 7.3 % (ref 3.0–12.0)
Neutro Abs: 4.9 10*3/uL (ref 1.4–7.7)
Neutrophils Relative %: 64.5 % (ref 43.0–77.0)
Platelets: 249 10*3/uL (ref 150.0–400.0)
RBC: 3.76 Mil/uL — ABNORMAL LOW (ref 3.87–5.11)
RDW: 12.8 % (ref 11.5–14.6)
WBC: 7.6 10*3/uL (ref 4.5–10.5)

## 2012-10-10 LAB — LIPID PANEL
Cholesterol: 225 mg/dL — ABNORMAL HIGH (ref 0–200)
HDL: 46.3 mg/dL (ref >39.00)
Total CHOL/HDL Ratio: 5
Triglycerides: 159 mg/dL — ABNORMAL HIGH (ref 0.0–149.0)
VLDL: 31.8 mg/dL (ref 0.0–40.0)

## 2012-10-10 LAB — HEPATIC FUNCTION PANEL
ALT: 13 U/L (ref 0–35)
AST: 21 U/L (ref 0–37)
Albumin: 4 g/dL (ref 3.5–5.2)
Alkaline Phosphatase: 106 U/L (ref 39–117)
Bilirubin, Direct: 0 mg/dL (ref 0.0–0.3)
Total Bilirubin: 0.2 mg/dL — ABNORMAL LOW (ref 0.3–1.2)
Total Protein: 6.9 g/dL (ref 6.0–8.3)

## 2012-10-10 LAB — T4, FREE: Free T4: 0.93 ng/dL (ref 0.60–1.60)

## 2012-10-10 LAB — LDL CHOLESTEROL, DIRECT: Direct LDL: 153.7 mg/dL

## 2012-10-10 LAB — TSH: TSH: 2.26 u[IU]/mL (ref 0.35–5.50)

## 2012-10-10 NOTE — Assessment & Plan Note (Signed)
Needs the meds Chronic, life long Panic still

## 2012-10-10 NOTE — Assessment & Plan Note (Signed)
And anxiety Lots of life stress this year

## 2012-10-10 NOTE — Assessment & Plan Note (Signed)
UTD with cancer screening Pneumovax due next year Discussed cigarettes

## 2012-10-10 NOTE — Progress Notes (Signed)
Subjective:    Patient ID: Lindsey Ward, female    DOB: 1950/12/05, 62 y.o.   MRN: [id number]  HPI Here for physical  Has had a rough time in past few months Fiancee died from his brain cancer (turned out to be lung cancer)--died 02-20-23 Had to move because mobile home was on his family's property Still has some days she just wants to stay in bed-not hopeless or anhedonic though  Got mobic from Dr Gavin Potters Not much help from low dose---- discussed that she can increase the dose  Current Outpatient Prescriptions on File Prior to Visit  Medication Sig Dispense Refill  . ALPRAZolam (XANAX) 1 MG tablet Take 1 tablet (1 mg total) by mouth 4 (four) times daily as needed.  120 tablet  0  . butalbital-acetaminophen-caffeine (FIORICET, ESGIC) 50-325-40 MG per tablet TAKE 1 TO 2 TABLETS BY MOUTH EVERY DAY AS NEEDED FOR HEADACHE  30 tablet  0  . gabapentin (NEURONTIN) 600 MG tablet TAKE 4 TABLETS AT BEDTIME  360 tablet  1  . HYDROcodone-acetaminophen (NORCO/VICODIN) 5-325 MG per tablet Take 1 tablet by mouth 3 (three) times daily as needed.  90 tablet  0   No current facility-administered medications on file prior to visit.    Allergies  Allergen Reactions  . Aspirin     REACTION: GI bleed  . Buspirone Hcl     REACTION: unspecified  . Ciprofloxacin     REACTION: unspecified  . Codeine   . Diazepam   . Duloxetine     REACTION: sz like activity  . Ibuprofen     REACTION: GI bleed  . Oxycodone-Acetaminophen     REACTION: unspecified  . Pregabalin     REACTION: kept her up and confusion  . Propoxyphene Hcl   . Risperidone     REACTION: confusion  . Sulfonamide Derivatives     REACTION: unspecified  . Tramadol Hcl     REACTION: doesn't remember what happened but couldn't tolerate  . Venlafaxine     REACTION: unspecified    Past Medical History  Diagnosis Date  . Chronic fatigue   . Obstipation   . Depression   . Hyperlipemia   . GERD (gastroesophageal reflux disease)    . PUD (peptic ulcer disease)   . Migraine   . Osteopenia     Past Surgical History  Procedure Laterality Date  . Appendectomy  2005  . Cholecystectomy  2004  . Total abdominal hysterectomy w/ bilateral salpingoophorectomy  1986    Family History  Problem Relation Age of Onset  . Heart disease Mother     AMI  . Stroke Mother   . Heart disease Father   . Hypertension Sister   . Hypertension Sister   . Heart disease Sister     MI  . Diabetes Sister     History   Social History  . Marital Status: Divorced    Spouse Name: N/A    Number of Children: 1  . Years of Education: N/A   Occupational History  . DISABLED    Social History Main Topics  . Smoking status: Current Every Day Smoker -- 1.00 packs/day for 40 years    Types: Cigarettes  . Smokeless tobacco: Never Used     Comment: has cut down to ~6 cigarettes per day  . Alcohol Use: No  . Drug Use: No  . Sexually Active: Not on file   Other Topics Concern  . Not on file   Social History  Narrative   Living alone again      No living will   Daughter should make health care decisions   Would accept resuscitation but no prolonged artificial ventilation   No tube feeds if cognitively unaware   Review of Systems  Constitutional: Negative for unexpected weight change.       Wears seat belt  HENT: Negative for hearing loss, congestion, rhinorrhea, dental problem and tinnitus.        Mouth stays dry Regular with dentist  Eyes: Negative for visual disturbance.       Due for eye exam  Respiratory: Negative for cough, chest tightness and shortness of breath.   Cardiovascular: Positive for palpitations. Negative for chest pain and leg swelling.       Heart will pound with panic  Gastrointestinal: Positive for abdominal pain. Negative for nausea, vomiting and blood in stool.       Chronic intermittent RLQ pain--if constipated No heartburn on the prilosec  Endocrine: Positive for cold intolerance. Negative for heat  intolerance.       Always cold intolerant  Genitourinary: Positive for dyspareunia. Negative for hematuria and difficulty urinating.       No sex now-- discussed safe sex Has vaginal atrophy--discussed estrogen cream if she wants to start relationship  Musculoskeletal: Positive for back pain and arthralgias. Negative for joint swelling.  Skin: Positive for rash.       Area on chest ---- just some dry skin  Allergic/Immunologic: Negative for environmental allergies and immunocompromised state.  Neurological: Positive for dizziness and headaches. Negative for syncope, weakness and light-headedness.       Mild orthostatic dizziness Chronic headaches  Hematological: Positive for adenopathy. Does not bruise/bleed easily.       ?transient neck puffiness  Psychiatric/Behavioral: Positive for sleep disturbance and dysphoric mood. The patient is nervous/anxious.        Objective:   Physical Exam  Constitutional: She is oriented to person, place, and time. She appears well-developed and well-nourished. No distress.  HENT:  Head: Normocephalic and atraumatic.  Right Ear: External ear normal.  Left Ear: External ear normal.  Mouth/Throat: Oropharynx is clear and moist. No oropharyngeal exudate.  Eyes: Conjunctivae and EOM are normal. Pupils are equal, round, and reactive to light.  Neck: Normal range of motion. Neck supple. No thyromegaly present.  Cardiovascular: Normal rate, regular rhythm, normal heart sounds and intact distal pulses.  Exam reveals no gallop.   No murmur heard. Pulmonary/Chest: Effort normal and breath sounds normal. No respiratory distress. She has no wheezes. She has no rales.  Abdominal: Soft. There is no tenderness.  Genitourinary:  Bilateral cystic changes without worrisome mass  Musculoskeletal: She exhibits no edema and no tenderness.  Lymphadenopathy:    She has no cervical adenopathy.    She has no axillary adenopathy.  Neurological: She is alert and oriented to  person, place, and time.  Skin: No rash noted. No erythema.  Psychiatric: Her behavior is normal.  Usual anxiety---not clearly depressed          Assessment & Plan:

## 2012-10-10 NOTE — Assessment & Plan Note (Signed)
meloxicam hydrocodone

## 2012-10-10 NOTE — Patient Instructions (Addendum)
You can take the meloxicam 7.5mg  up to twice a day. If you need the 15mg  regularly, I can send a prescription for the higher dose.  Your next screening mammogram is due in November 2015---they only need to be done every 2 years.

## 2012-10-12 ENCOUNTER — Other Ambulatory Visit: Payer: Self-pay | Admitting: Internal Medicine

## 2012-10-12 NOTE — Telephone Encounter (Signed)
rx called into pharmacy

## 2012-10-12 NOTE — Telephone Encounter (Signed)
Okay #30 x 0 

## 2012-10-13 ENCOUNTER — Other Ambulatory Visit: Payer: Self-pay | Admitting: Internal Medicine

## 2012-10-15 ENCOUNTER — Encounter: Payer: Self-pay | Admitting: *Deleted

## 2012-10-25 ENCOUNTER — Telehealth: Payer: Self-pay | Admitting: Internal Medicine

## 2012-10-25 NOTE — Telephone Encounter (Signed)
Called regarding swelling to extremities; itching of face, tenderness of face. Unable to reach patient at number provided. Left message to call office.

## 2012-10-26 ENCOUNTER — Encounter: Payer: Self-pay | Admitting: Internal Medicine

## 2012-10-26 ENCOUNTER — Telehealth: Payer: Self-pay

## 2012-10-26 NOTE — Telephone Encounter (Signed)
Spoke with patient and advised results   

## 2012-10-26 NOTE — Telephone Encounter (Signed)
It sounds like she should stop the mobic  She can try OTC aleve-- 2 tabs up to twice a day--- to see if that helps She might need to call Dr Gavin Potters for further instructions if that doesn't help the pain

## 2012-10-26 NOTE — Telephone Encounter (Signed)
Record Num: [id number] Operator: Malachi Paradise Patient Name: Lindsey Ward Call Date & Time: 10/25/2012 5:27:39PM Patient Phone: [phone] PCP: Tillman Abide Patient Gender: Female PCP Fax : [phone] Patient DOB: 10-01-1950 Practice Name[facility] Reason for Call: Caller: [name]/Patient; PCP: Tillman Abide (Family Practice); CB#: [phone]; Call regarding Medication question; Onset 10/24/12 Pt states she is on Mobic (Meloxicam) 1 tab BID and has side effects face, fingers, and toes with numbness, itching, edema of hands and skin tender to the touch. Pt reports she did not take medication this morning and symptoms are much improved. Pt states she would like to stop medication. Contacted MD on call Dr Debby Bud and he advised to hold medication and f/u with Dr Alphonsus Sias in the morning when office is opn. Orders received to take 1000mg  Tylenol q 8 hrs. Pt verbalized understanding. Protocol(s) Used: Office Note Recommended Outcome per Protocol: Information Noted and Sent to Office Reason for Outcome: Caller information to office Care Advice: ~

## 2012-10-26 NOTE — Telephone Encounter (Signed)
Pt said mouth is sore on inside, both eyelids feel like bumps on inside of the eyelids, blurred vision, and bumps on face are gone. No SOB, no problem swallowing and no swelling of throat.  Last time took Meloxicam was 10/24/12.  Pt wants to know what to do about med for pain in lt arm from shoulder to elbow. Please advise. [facility] [place].

## 2012-10-26 NOTE — Telephone Encounter (Signed)
See 10/26/2012 call-a-nurse note

## 2012-11-07 ENCOUNTER — Other Ambulatory Visit: Payer: Self-pay | Admitting: Internal Medicine

## 2012-11-07 NOTE — Telephone Encounter (Signed)
Okay to refill alprazolam #120 x 0 Hydrocodone #90 x 0

## 2012-11-07 NOTE — Telephone Encounter (Signed)
Ok to fill?  Both last filled 10/05/12.

## 2012-11-07 NOTE — Telephone Encounter (Signed)
rx called into pharmacy

## 2012-12-07 ENCOUNTER — Other Ambulatory Visit: Payer: Self-pay | Admitting: Internal Medicine

## 2012-12-07 NOTE — Telephone Encounter (Signed)
Okay alprazolam #120 x 0 Hydrocodone #90 x 0

## 2012-12-07 NOTE — Telephone Encounter (Signed)
rx called into pharmacy

## 2012-12-07 NOTE — Telephone Encounter (Signed)
Last filled 11/07/12 

## 2012-12-12 ENCOUNTER — Telehealth: Payer: Self-pay

## 2012-12-12 NOTE — Telephone Encounter (Signed)
Pt has burning and watery eyes; runny nose; pt said OTC Zyrtec helps but pt request Dr Alphonsus Sias to send in prescription for generic Zyrtec to [facility] due to cost of med.Please advise.

## 2012-12-13 NOTE — Telephone Encounter (Signed)
.  left message to have patient return my call.  

## 2012-12-13 NOTE — Telephone Encounter (Signed)
I don't think it is covered unless she has medicaid  You can try though Cetirizine 10mg  #30 x 11 1 daily prn for allergies  Let her know that a bottle of 300 is $12 or so at [facility] and [facility] That is only about 3-4 cents per pill

## 2012-12-14 NOTE — Telephone Encounter (Signed)
Spoke with patient and advised results   

## 2012-12-20 ENCOUNTER — Other Ambulatory Visit: Payer: Self-pay

## 2012-12-20 DIAGNOSIS — Z1231 Encounter for screening mammogram for malignant neoplasm of breast: Secondary | ICD-10-CM

## 2012-12-25 ENCOUNTER — Other Ambulatory Visit: Payer: Self-pay | Admitting: Internal Medicine

## 2012-12-25 NOTE — Telephone Encounter (Signed)
LETVAK PATIENT, Please send back to me for call in  

## 2012-12-25 NOTE — Telephone Encounter (Signed)
plz phone in. 

## 2012-12-26 NOTE — Telephone Encounter (Signed)
rx called into pharmacy

## 2013-01-07 ENCOUNTER — Other Ambulatory Visit: Payer: Self-pay | Admitting: Internal Medicine

## 2013-01-08 NOTE — Telephone Encounter (Signed)
Last filled 12/07/12

## 2013-01-08 NOTE — Telephone Encounter (Signed)
Okay #120 x 0 

## 2013-01-08 NOTE — Telephone Encounter (Signed)
rx called into pharmacy

## 2013-01-09 ENCOUNTER — Ambulatory Visit (INDEPENDENT_AMBULATORY_CARE_PROVIDER_SITE_OTHER): Payer: Medicare Other | Admitting: Internal Medicine

## 2013-01-09 ENCOUNTER — Encounter: Payer: Self-pay | Admitting: Internal Medicine

## 2013-01-09 VITALS — BP 110/60 | HR 65 | Temp 98.1°F | Wt 117.0 lb

## 2013-01-09 DIAGNOSIS — IMO0001 Reserved for inherently not codable concepts without codable children: Secondary | ICD-10-CM

## 2013-01-09 MED ORDER — HYDROCODONE-ACETAMINOPHEN 5-325 MG PO TABS: 1.0000 | ORAL_TABLET | Freq: Three times a day (TID) | ORAL | Status: DC | PRN

## 2013-01-09 NOTE — Patient Instructions (Signed)
Please spread out your gabapentin--take 1 in morning, 1 in afternoon and 2 at bedtime. Try to do some physical activity every day. Call if you are not better in the next 2-3 weeks---we will try cyclobenzaprine again.

## 2013-01-09 NOTE — Progress Notes (Signed)
Subjective:    Patient ID: Lindsey Ward, female    DOB: 12-Dec-1950, 62 y.o.   MRN: [id number]  HPI Feels her fibromyalgia is worse "it knocks me down" May just stay in bed for a couple of days  Living in [place] in apartment by herself Spends time with daughter and sisters in area Doesn't really cook No exercise--- "it is stirred up too bad" Will go out and be active on a good day (like to the mall) No social activities-- does have some friends at the apartment building (8 units)  Uses the gabapentin 1AM, 3 at bedtime Has taken elavil in past---doesn't seem to have helped Cyclobenzaprine didn't help Thinks diazepam did help Takes the xanax 4 daily consistently Uses heating pad and it helps  Current Outpatient Prescriptions on File Prior to Visit  Medication Sig Dispense Refill  . ALPRAZolam (XANAX) 1 MG tablet TAKE 1 TABLET BY MOUTH 4 TIMES A DAY  120 tablet  0  . butalbital-acetaminophen-caffeine (FIORICET, ESGIC) 50-325-40 MG per tablet TAKE 1 TO 2 TABLETS EVERY DAY AS NEEDED FOR HEADACHE  30 tablet  0  . gabapentin (NEURONTIN) 600 MG tablet TAKE 4 TABLETS AT BEDTIME  360 tablet  1  . HYDROcodone-acetaminophen (NORCO/VICODIN) 5-325 MG per tablet TAKE 1 TABLET BY MOUTH 3 TIMES A DAY AS NEEDED  90 tablet  0  . omeprazole (PRILOSEC) 20 MG capsule Take 20 mg by mouth daily as needed.       No current facility-administered medications on file prior to visit.    Allergies  Allergen Reactions  . Aspirin     REACTION: GI bleed  . Buspirone Hcl     REACTION: unspecified  . Ciprofloxacin     REACTION: unspecified  . Codeine   . Diazepam   . Duloxetine     REACTION: sz like activity  . Ibuprofen     REACTION: GI bleed  . Oxycodone-Acetaminophen     REACTION: unspecified  . Pregabalin     REACTION: kept her up and confusion  . Propoxyphene Hcl   . Risperidone     REACTION: confusion  . Sulfonamide Derivatives     REACTION: unspecified  . Tramadol Hcl     REACTION:  doesn't remember what happened but couldn't tolerate  . Venlafaxine     REACTION: unspecified    Past Medical History  Diagnosis Date  . Chronic fatigue   . Obstipation   . Depression   . Hyperlipemia   . GERD (gastroesophageal reflux disease)   . PUD (peptic ulcer disease)   . Migraine   . Osteopenia     Past Surgical History  Procedure Laterality Date  . Appendectomy  2005  . Cholecystectomy  2004  . Total abdominal hysterectomy w/ bilateral salpingoophorectomy  1986    Family History  Problem Relation Age of Onset  . Heart disease Mother     AMI  . Stroke Mother   . Heart disease Father   . Hypertension Sister   . Hypertension Sister   . Heart disease Sister     MI  . Diabetes Sister     History   Social History  . Marital Status: Divorced    Spouse Name: N/A    Number of Children: 1  . Years of Education: N/A   Occupational History  . DISABLED    Social History Main Topics  . Smoking status: Current Every Day Smoker -- 1.00 packs/day for 40 years    Types: Cigarettes  .  Smokeless tobacco: Never Used     Comment: has cut down to ~6 cigarettes per day  . Alcohol Use: No  . Drug Use: No  . Sexual Activity: Not on file   Other Topics Concern  . Not on file   Social History Narrative   Living alone again      No living will   Daughter should make health care decisions   Would accept resuscitation but no prolonged artificial ventilation   No tube feeds if cognitively unaware   Review of Systems Sleeping okay Tries to stay out in the sun as much as possible--but not clearly helpful    Objective:   Physical Exam  Constitutional: She appears well-developed and well-nourished. No distress.  Neck: Normal range of motion. Neck supple. No thyromegaly present.  Cardiovascular: Normal rate, regular rhythm and normal heart sounds.  Exam reveals no gallop.   No murmur heard. Pulmonary/Chest: Effort normal and breath sounds normal. No respiratory  distress. She has no wheezes. She has no rales.  Musculoskeletal: She exhibits no edema and no tenderness.  Trigger points are mostly all tender  Lymphadenopathy:    She has no cervical adenopathy.          Assessment & Plan:

## 2013-01-09 NOTE — Assessment & Plan Note (Signed)
Really seems to be worse ?cooler and darker days  Discussed trying to get out regularly Will spread out the gabapentin Discussed trying to limit the hydrocodone  Consider retrying flexeril if not improving

## 2013-01-21 ENCOUNTER — Ambulatory Visit
Admission: RE | Admit: 2013-01-21 | Discharge: 2013-01-21 | Disposition: A | Discharge status: Home / Self Care | Payer: Medicare Other | Source: Ambulatory Visit

## 2013-01-21 DIAGNOSIS — Z1231 Encounter for screening mammogram for malignant neoplasm of breast: Secondary | ICD-10-CM

## 2013-01-22 ENCOUNTER — Telehealth: Payer: Self-pay

## 2013-01-22 NOTE — Telephone Encounter (Signed)
Pt saw Dr Neale Burly and pt needs instructions on how to stop hydrocodone apap; pt is to stop the generic fioricet and Dr Neale Burly is starting pt on Tenormin; pt is not sure of mg. Dr Neale Burly wants to do nerve conduction study and pt wants to know Dr Vassie Moselle opinion if pt needs nerve conduction study. Pt request cb with instructions on how should gradually stop hydrocodone apap also.

## 2013-01-22 NOTE — Telephone Encounter (Signed)
Have her cut down to two a day for a week, then one a day for a week. If no problems, she can then stop and only use it if severe pain (rarely)

## 2013-01-23 ENCOUNTER — Encounter: Payer: Self-pay | Admitting: *Deleted

## 2013-01-23 NOTE — Telephone Encounter (Signed)
Spoke with patient and advised results   

## 2013-01-23 NOTE — Telephone Encounter (Signed)
Pt left v/m requesting cb. 

## 2013-02-04 ENCOUNTER — Other Ambulatory Visit: Payer: Self-pay | Admitting: Internal Medicine

## 2013-02-08 ENCOUNTER — Telehealth: Payer: Self-pay | Admitting: *Deleted

## 2013-02-08 ENCOUNTER — Other Ambulatory Visit: Payer: Self-pay | Admitting: Internal Medicine

## 2013-02-08 MED ORDER — CYCLOBENZAPRINE HCL 10 MG PO TABS: 10.0000 mg | ORAL_TABLET | Freq: Three times a day (TID) | ORAL | Status: DC | PRN

## 2013-02-08 MED ORDER — ALPRAZOLAM 1 MG PO TABS: 1.0000 mg | ORAL_TABLET | Freq: Four times a day (QID) | ORAL | Status: DC

## 2013-02-08 NOTE — Telephone Encounter (Signed)
Rx sent to pharmacy, I advised pt of Dr. Royden Purl comment and pt verbalized undstanding  Pt also wanted to see if you could refill her xanax, she is due and Dr. Alphonsus Sias out of office, please advise

## 2013-02-08 NOTE — Telephone Encounter (Signed)
Rx called in as prescribed and pt advised

## 2013-02-08 NOTE — Telephone Encounter (Signed)
Since she cannot come in - if pain worsens -definitely go to ER (by EMS if needed)  Try flexeril (Px written for call in  ) instead of baclofen to see if it is more helpful  Watch out for sedation

## 2013-02-08 NOTE — Telephone Encounter (Signed)
Pt called back stating pharmacy did not have the xanax rx. I called rx in to pharmacy.

## 2013-02-08 NOTE — Telephone Encounter (Signed)
C/o severe back pain and some tingling in upper legs since yesterday, can hardly move. She has taken 1 baclofen 10mg  (perscribed by [facility] Dr for migraines), 1 gabapentin, 1 hydrocodone,1 xanax for pain, and is using a heating pad. Nothing has helped. She has says she has fibromyalgia and hx of back pain. Denies trauma. I offered her an appointment today, but she doesn't think she can make it in. Please advise?

## 2013-02-08 NOTE — Telephone Encounter (Signed)
except  Will refill for 1 mo in absence of PCP

## 2013-02-11 ENCOUNTER — Ambulatory Visit (INDEPENDENT_AMBULATORY_CARE_PROVIDER_SITE_OTHER): Payer: Medicare Other | Admitting: Family Medicine

## 2013-02-11 ENCOUNTER — Encounter: Payer: Self-pay | Admitting: Family Medicine

## 2013-02-11 ENCOUNTER — Other Ambulatory Visit: Payer: Self-pay | Admitting: *Deleted

## 2013-02-11 VITALS — BP 112/72 | HR 62 | Temp 98.1°F | Wt 119.5 lb

## 2013-02-11 DIAGNOSIS — M545 Low back pain, unspecified: Secondary | ICD-10-CM | POA: Insufficient documentation

## 2013-02-11 MED ORDER — CYCLOBENZAPRINE HCL 10 MG PO TABS: 10.0000 mg | ORAL_TABLET | Freq: Three times a day (TID) | ORAL | Status: DC | PRN

## 2013-02-11 MED ORDER — HYDROCODONE-ACETAMINOPHEN 5-325 MG PO TABS: 1.0000 | ORAL_TABLET | Freq: Three times a day (TID) | ORAL | Status: DC | PRN

## 2013-02-11 NOTE — Progress Notes (Signed)
Pre-visit discussion using our clinic review tool. No additional management support is needed unless otherwise documented below in the visit note.  Migraines improved with BB per [facility].  She is off baclofen in the meantime.    Lower back pain.  No radiation down into the legs B.  She has B quad aching and tingling but it isn't clearly radicular.  No weakness in legs. No B/B incontinence.  No FCNAVD.  No rash.  Started 4 days ago.  No clear trigger.  She was on her feet a lot for Thanksgiving.  No trauma.  Pain is just to side of midline, above the belt, radiating to the side bilaterally. No midline pain.    She took some flexeril this AM.  Today is better than the last few days.    Meds, vitals, and allergies reviewed.   ROS: See HPI.  Otherwise, noncontributory.  nad ncat Mmm rrr ctab abd soft, not ttp Back w/o midline pain.  Is ttp in the B lower paraspinal muscles w/o rash Distally NV intact grossly

## 2013-02-11 NOTE — Patient Instructions (Signed)
Use flexeril for the back pain and gently stretch you back.  Use the heating pad but don't fall asleep on it.   Take care.

## 2013-02-11 NOTE — Assessment & Plan Note (Signed)
Benign exam, would stretch, continue baseline pain meds and use flexeril for likely muscle spasm.  F/u prn.

## 2013-03-08 ENCOUNTER — Other Ambulatory Visit: Payer: Self-pay | Admitting: Family Medicine

## 2013-03-08 NOTE — Telephone Encounter (Signed)
Okay #30 x 0 

## 2013-03-08 NOTE — Telephone Encounter (Signed)
rx sent to pharmacy by e-script  

## 2013-03-08 NOTE — Telephone Encounter (Signed)
Okay #120 x 0 

## 2013-03-08 NOTE — Telephone Encounter (Signed)
rx called into pharmacy

## 2013-03-22 ENCOUNTER — Encounter: Payer: Self-pay | Admitting: Internal Medicine

## 2013-03-22 ENCOUNTER — Ambulatory Visit (INDEPENDENT_AMBULATORY_CARE_PROVIDER_SITE_OTHER): Payer: Medicare Other | Admitting: Internal Medicine

## 2013-03-22 VITALS — BP 100/68 | HR 53 | Temp 98.1°F | Wt 122.0 lb

## 2013-03-22 DIAGNOSIS — R8290 Unspecified abnormal findings in urine: Secondary | ICD-10-CM

## 2013-03-22 DIAGNOSIS — R82998 Other abnormal findings in urine: Secondary | ICD-10-CM

## 2013-03-22 DIAGNOSIS — G43909 Migraine, unspecified, not intractable, without status migrainosus: Secondary | ICD-10-CM

## 2013-03-22 LAB — POCT URINALYSIS DIPSTICK
Bilirubin, UA: NEGATIVE
Blood, UA: NEGATIVE
Glucose, UA: NEGATIVE
Ketones, UA: NEGATIVE
Leukocytes, UA: NEGATIVE
Nitrite, UA: NEGATIVE
Protein, UA: NEGATIVE
Spec Grav, UA: <=1.005
Urobilinogen, UA: NEGATIVE
pH, UA: 6

## 2013-03-22 MED ORDER — HYDROCODONE-ACETAMINOPHEN 5-325 MG PO TABS: 1.0000 | ORAL_TABLET | Freq: Three times a day (TID) | ORAL | Status: DC | PRN

## 2013-03-22 NOTE — Progress Notes (Signed)
Subjective:    Patient ID: Lindsey Ward, female    DOB: [birth year], 63 y.o.   MRN: [id number]  HPI Has noted some pain across lower back at times Also notes terrible smell at times Noticed mostly over the past month  Drinks a fair amount of fluids---water, tea, soda Goes 5-6 times per day Not usually at night No dysuria No hematuria No fever  Current Outpatient Prescriptions on File Prior to Visit  Medication Sig Dispense Refill  . ALPRAZolam (XANAX) 1 MG tablet TAKE 1 TABLET BY MOUTH 3 TIMES A DAY  120 tablet  0  . atenolol (TENORMIN) 25 MG tablet Take 25 mg by mouth daily.      . cyclobenzaprine (FLEXERIL) 10 MG tablet TAKE 1 TABLET (10 MG TOTAL) BY MOUTH 3 (THREE) TIMES DAILY AS NEEDED FOR MUSCLE SPASMS.  30 tablet  0  . gabapentin (NEURONTIN) 600 MG tablet TAKE 4 TABLETS BY MOUTH AT BEDTIME  360 tablet  1  . HYDROcodone-acetaminophen (NORCO/VICODIN) 5-325 MG per tablet Take 1 tablet by mouth 3 (three) times daily as needed.  90 tablet  0  . omeprazole (PRILOSEC) 20 MG capsule Take 20 mg by mouth daily as needed.       No current facility-administered medications on file prior to visit.    Allergies  Allergen Reactions  . Aspirin     REACTION: GI bleed  . Buspirone Hcl     REACTION: unspecified  . Ciprofloxacin     REACTION: unspecified  . Codeine   . Diazepam   . Duloxetine     REACTION: sz like activity  . Ibuprofen     REACTION: GI bleed  . Oxycodone-Acetaminophen     REACTION: unspecified  . Pregabalin     REACTION: kept her up and confusion  . Propoxyphene Hcl   . Risperidone     REACTION: confusion  . Sulfonamide Derivatives     REACTION: unspecified  . Tramadol Hcl     REACTION: doesn't remember what happened but couldn't tolerate  . Venlafaxine     REACTION: unspecified    Past Medical History  Diagnosis Date  . Chronic fatigue   . Obstipation   . Depression   . Hyperlipemia   . GERD (gastroesophageal reflux disease)   . PUD (peptic ulcer  disease)   . Migraine   . Osteopenia     Past Surgical History  Procedure Laterality Date  . Appendectomy  2005  . Cholecystectomy  2004  . Total abdominal hysterectomy w/ bilateral salpingoophorectomy  1986    Family History  Problem Relation Age of Onset  . Heart disease Mother     AMI  . Stroke Mother   . Heart disease Father   . Hypertension Sister   . Hypertension Sister   . Heart disease Sister     MI  . Diabetes Sister     History   Social History  . Marital Status: Divorced    Spouse Name: N/A    Number of Children: 1  . Years of Education: N/A   Occupational History  . DISABLED    Social History Main Topics  . Smoking status: Current Every Day Smoker -- 1.00 packs/day for 40 years    Types: Cigarettes  . Smokeless tobacco: Never Used     Comment: has cut down to ~6 cigarettes per day  . Alcohol Use: No  . Drug Use: No  . Sexual Activity: Not on file   Other Topics  Concern  . Not on file   Social History Narrative   Living alone again      No living will   Daughter should make health care decisions   Would accept resuscitation but no prolonged artificial ventilation   No tube feeds if cognitively unaware   Review of Systems Back pain is about the same Ongoing neck pain also---feels "knotty"    Objective:   Physical Exam  Constitutional: She appears well-developed and well-nourished. No distress.  Abdominal: Soft. She exhibits no distension. There is no tenderness. There is no rebound and no guarding.  Musculoskeletal: She exhibits no edema and no tenderness.  No CVA tenderness  Psychiatric: She has a normal mood and affect. Her behavior is normal.          Assessment & Plan:

## 2013-03-22 NOTE — Progress Notes (Signed)
Pre-visit discussion using our clinic review tool. No additional management support is needed unless otherwise documented below in the visit note.  

## 2013-03-22 NOTE — Assessment & Plan Note (Signed)
No evidence of infection Discussed increasing fluids if needed

## 2013-03-22 NOTE — Assessment & Plan Note (Signed)
Headache now better on atenolol  Using only 1/2 tab Prefers not to return to Dr Domingo Cocking

## 2013-04-05 ENCOUNTER — Encounter: Payer: Self-pay | Admitting: Family Medicine

## 2013-04-05 ENCOUNTER — Telehealth: Payer: Self-pay | Admitting: Internal Medicine

## 2013-04-05 ENCOUNTER — Ambulatory Visit (INDEPENDENT_AMBULATORY_CARE_PROVIDER_SITE_OTHER): Payer: Medicare Other | Admitting: Family Medicine

## 2013-04-05 VITALS — BP 100/60 | HR 74 | Temp 98.0°F | Ht 60.0 in | Wt 120.0 lb

## 2013-04-05 DIAGNOSIS — R1031 Right lower quadrant pain: Secondary | ICD-10-CM

## 2013-04-05 DIAGNOSIS — R1032 Left lower quadrant pain: Secondary | ICD-10-CM

## 2013-04-05 LAB — CBC WITH DIFFERENTIAL/PLATELET
Basophils Absolute: 0 10*3/uL (ref 0.0–0.1)
Basophils Relative: 0.1 % (ref 0.0–3.0)
Eosinophils Absolute: 0.1 10*3/uL (ref 0.0–0.7)
Eosinophils Relative: 1.1 % (ref 0.0–5.0)
HCT: 37.8 % (ref 36.0–46.0)
Hemoglobin: 12.7 g/dL (ref 12.0–15.0)
Lymphocytes Relative: 26.5 % (ref 12.0–46.0)
Lymphs Abs: 2.3 10*3/uL (ref 0.7–4.0)
MCHC: 33.6 g/dL (ref 30.0–36.0)
MCV: 90.9 fl (ref 78.0–100.0)
Monocytes Absolute: 0.5 10*3/uL (ref 0.1–1.0)
Monocytes Relative: 5.7 % (ref 3.0–12.0)
Neutro Abs: 5.7 10*3/uL (ref 1.4–7.7)
Neutrophils Relative %: 66.6 % (ref 43.0–77.0)
Platelets: 293 10*3/uL (ref 150.0–400.0)
RBC: 4.17 Mil/uL (ref 3.87–5.11)
RDW: 13.4 % (ref 11.5–14.6)
WBC: 8.5 10*3/uL (ref 4.5–10.5)

## 2013-04-05 NOTE — Assessment & Plan Note (Addendum)
PAin not severe 3-4 on pain scale.  Likely due to recent diarrheal illnes ? Due to virus.  Check cbc to make sure no increase in wbcs.  If nml, symptomatic care and time, if increased or pain not improving consider ct abd pelvis to eval for diverticulitis.

## 2013-04-05 NOTE — Progress Notes (Signed)
Pre-visit discussion using our clinic review tool. No additional management support is needed unless otherwise documented below in the visit note.  

## 2013-04-05 NOTE — Telephone Encounter (Signed)
Patient Information:  Caller Name: Justa  Phone: [phone]  Patient: Lindsey Ward, Lindsey Ward  Gender: Female  DOB: February 18, 1951  Age: 63 Years  PCP: Viviana Simpler [facility])  Office Follow Up:  Does the office need to follow up with this patient?: No  Instructions For The Office: N/A   Symptoms  Reason For Call & Symptoms: Onset 1/21 of diarrhea and abdominal pain, was every 5-10 minutes at first, none today but stomach is sore;  Pain across the lower abdomen and goes a little around into the right side of back;  As long as she is sitting down it is "ok", worse with standing and walking;  Non tender;  Pain is constant, about a 4-5/10, heat helps some; No painful urination or blood in urine;  Afebrile;  Drinking but appetite is decreased.  Reviewed Health History In EMR: Yes  Reviewed Medications In EMR: Yes  Reviewed Allergies In EMR: Yes  Reviewed Surgeries / Procedures: Yes  Date of Onset of Symptoms: 04/03/2013  Treatments Tried: heat pad  Treatments Tried Worked: No  Guideline(s) Used:  Abdominal Pain - Female  Disposition Per Guideline:   See Today in Office  Reason For Disposition Reached:   Age > 60 years  Advice Given:  Rest:  Lie down and rest until you feel better.  Fluids:  Sip clear fluids only (e.g., water, flat soft drinks or 1/2 strength fruit juice) until the pain has been gone for over 2 hours. Then slowly return to a regular diet.  Diet:  Slowly advance diet from clear liquids to a bland diet  Avoid greasy or fatty foods.  Call Back If:  Abdominal pain is constant and present for more than 2 hours  Abdominal pains come and go and are present for more than 24 hours  You become worse.  Patient Will Follow Care Advice:  YES  Appointment Scheduled:  04/05/2013 14:45:00 Appointment Scheduled Provider:  Eliezer Lofts [facility])

## 2013-04-05 NOTE — Addendum Note (Signed)
Addended by: Ellamae Sia on: 04/05/2013 03:28 PM   Modules accepted: Orders

## 2013-04-05 NOTE — Telephone Encounter (Signed)
Noted  

## 2013-04-05 NOTE — Progress Notes (Signed)
   Subjective:    Patient ID: Lindsey Ward, female    DOB: [birth year], 63 y.o.   MRN: [id number]  HPI 63 year old  female with history of depression, anxiety, with new onset diarrhea 2 days ago. Watery and more frequent.  Occuring every few minutes. She has not had any diarrhea since yesterday.  No emesis, some intermittant nausea.  No blood in stool. Bloating. No dysuria, no fever.  Pain not worse, but not better.    Diffuse abdominal pain, sharp pain worse with with movement.  No recent antibiotics, she did eat out the night she started diarrhea.  No sick contacts.  Has tried gas X, minimal relief.  Tried hydrocodone for pain. Has for fibromyalgia.  S/p appendectomy and gallbladder surgery. Has history of partial colectomy for     Review of Systems  Constitutional: Negative for fever and fatigue.  HENT: Negative for ear pain.   Eyes: Negative for pain.  Respiratory: Negative for chest tightness and shortness of breath.   Cardiovascular: Negative for chest pain, palpitations and leg swelling.  Gastrointestinal: Negative for abdominal pain.  Genitourinary: Negative for dysuria.       Objective:   Physical Exam  Constitutional: Vital signs are normal. She appears well-developed and well-nourished. She is cooperative.  Non-toxic appearance. She does not appear ill. No distress.  HENT:  Head: Normocephalic.  Right Ear: Hearing, tympanic membrane, external ear and ear canal normal. Tympanic membrane is not erythematous, not retracted and not bulging.  Left Ear: Hearing, tympanic membrane, external ear and ear canal normal. Tympanic membrane is not erythematous, not retracted and not bulging.  Nose: No mucosal edema or rhinorrhea. Right sinus exhibits no maxillary sinus tenderness and no frontal sinus tenderness. Left sinus exhibits no maxillary sinus tenderness and no frontal sinus tenderness.  Mouth/Throat: Uvula is midline, oropharynx is clear and moist and mucous membranes  are normal.  Eyes: Conjunctivae, EOM and lids are normal. Pupils are equal, round, and reactive to light. Lids are everted and swept, no foreign bodies found.  Neck: Trachea normal and normal range of motion. Neck supple. Carotid bruit is not present. No mass and no thyromegaly present.  Cardiovascular: Normal rate, regular rhythm, S1 normal, S2 normal, normal heart sounds, intact distal pulses and normal pulses.  Exam reveals no gallop and no friction rub.   No murmur heard. Pulmonary/Chest: Effort normal and breath sounds normal. Not tachypneic. No respiratory distress. She has no decreased breath sounds. She has no wheezes. She has no rhonchi. She has no rales.  Abdominal: Soft. Normal appearance. She exhibits distension. She exhibits no fluid wave. Bowel sounds are increased. There is no hepatosplenomegaly. There is tenderness in the right lower quadrant and left lower quadrant. There is guarding. There is no rigidity, no rebound, no CVA tenderness, no tenderness at McBurney's point and negative Murphy's sign.  Neurological: She is alert.  Skin: Skin is warm, dry and intact. No rash noted.  Psychiatric: Her speech is normal and behavior is normal. Judgment and thought content normal. Her mood appears not anxious. Cognition and memory are normal. She does not exhibit a depressed mood.          Assessment & Plan:

## 2013-04-05 NOTE — Patient Instructions (Addendum)
Rest fluids. Stop at lab on way out. If pain severe go to ER, other wise call to let us know how you are feeling on Monday, if not better we can consider further evaluation.

## 2013-04-08 ENCOUNTER — Telehealth: Payer: Self-pay | Admitting: Internal Medicine

## 2013-04-08 NOTE — Telephone Encounter (Signed)
Relevant patient education mailed to patient.  

## 2013-04-10 ENCOUNTER — Other Ambulatory Visit: Payer: Self-pay | Admitting: Internal Medicine

## 2013-04-10 NOTE — Telephone Encounter (Signed)
Okay alprazolam #120 x 0 Cyclobenzaprine #30 x 0  Baclofen is from headache doctor. If she is not seeing him/her anymore, okay #10 x 0

## 2013-04-10 NOTE — Telephone Encounter (Signed)
rx sent to pharmacy by e-script rx called into pharmacy  

## 2013-05-08 ENCOUNTER — Other Ambulatory Visit: Payer: Self-pay

## 2013-05-08 NOTE — Telephone Encounter (Signed)
Pt left note requesting rx hydrocodone apap. Call when ready for pick up.

## 2013-05-10 MED ORDER — HYDROCODONE-ACETAMINOPHEN 5-325 MG PO TABS: 1.0000 | ORAL_TABLET | Freq: Three times a day (TID) | ORAL | Status: DC | PRN

## 2013-05-10 NOTE — Telephone Encounter (Signed)
Called home number no answer and no answering machine, will leave rx up front for pt pick-up

## 2013-05-13 ENCOUNTER — Other Ambulatory Visit: Payer: Self-pay | Admitting: Internal Medicine

## 2013-05-13 ENCOUNTER — Ambulatory Visit: Payer: Medicare Other | Admitting: Internal Medicine

## 2013-05-13 NOTE — Telephone Encounter (Signed)
Last filled 04/10/13 °

## 2013-05-13 NOTE — Telephone Encounter (Signed)
Okay #120 x 0

## 2013-05-14 NOTE — Telephone Encounter (Signed)
rx called into pharmacy

## 2013-05-18 ENCOUNTER — Other Ambulatory Visit: Payer: Self-pay | Admitting: Internal Medicine

## 2013-06-12 ENCOUNTER — Other Ambulatory Visit: Payer: Self-pay | Admitting: Internal Medicine

## 2013-06-13 NOTE — Telephone Encounter (Signed)
rx called into pharmacy

## 2013-06-13 NOTE — Telephone Encounter (Signed)
Okay #120 x 0

## 2013-06-19 ENCOUNTER — Ambulatory Visit (INDEPENDENT_AMBULATORY_CARE_PROVIDER_SITE_OTHER): Payer: Medicare Other | Admitting: Internal Medicine

## 2013-06-19 ENCOUNTER — Encounter: Payer: Self-pay | Admitting: Internal Medicine

## 2013-06-19 ENCOUNTER — Telehealth: Payer: Self-pay

## 2013-06-19 VITALS — BP 100/60 | HR 65 | Temp 98.6°F | Wt 124.0 lb

## 2013-06-19 DIAGNOSIS — M542 Cervicalgia: Secondary | ICD-10-CM

## 2013-06-19 NOTE — Telephone Encounter (Signed)
Will review at her visit

## 2013-06-19 NOTE — Progress Notes (Signed)
Subjective:    Patient ID: Lindsey Ward, female    DOB: 06-28-1950, 63 y.o.   MRN: [id number]  HPI Having problems with her neck and shoulders Having pain--hard to turn or bend Has been seeing Dr Birdie Sons-- did x-rays and did some Rx (?TENS) Didn't do adjustment  Goes back 3 days and now worse Flexeril not helping Has feeling "like needles" running down left arm No weakness in arms though  Using heating pad---some help  No known injury No new tasks that she remembers  Current Outpatient Prescriptions on File Prior to Visit  Medication Sig Dispense Refill  . ALPRAZolam (XANAX) 1 MG tablet TAKE 1 TABLET BY MOUTH 3 TIMES A DAY  120 tablet  0  . atenolol (TENORMIN) 25 MG tablet TAKE 1 TABLET BY MOUTH EVERY DAY  30 tablet  11  . baclofen (LIORESAL) 10 MG tablet TAKE 1 TO 2 DAYS PER WEEK AS NEEDED FOR HEADACHE, AVOID DAILY USE  10 tablet  0  . cyclobenzaprine (FLEXERIL) 10 MG tablet TAKE 1 TABLET BY MOUTH 3 TIMES A DAY AS NEEDED FOR MUSCLE SPASMS  30 tablet  0  . gabapentin (NEURONTIN) 600 MG tablet TAKE 4 TABLETS BY MOUTH AT BEDTIME  360 tablet  1  . HYDROcodone-acetaminophen (NORCO/VICODIN) 5-325 MG per tablet Take 1 tablet by mouth 3 (three) times daily as needed.  90 tablet  0  . omeprazole (PRILOSEC) 20 MG capsule Take 20 mg by mouth daily as needed.       No current facility-administered medications on file prior to visit.    Allergies  Allergen Reactions  . Aspirin     REACTION: GI bleed  . Buspirone Hcl     REACTION: unspecified  . Ciprofloxacin     REACTION: unspecified  . Codeine   . Diazepam   . Duloxetine     REACTION: sz like activity  . Ibuprofen     REACTION: GI bleed  . Oxycodone-Acetaminophen     REACTION: unspecified  . Pregabalin     REACTION: kept her up and confusion  . Propoxyphene Hcl   . Risperidone     REACTION: confusion  . Sulfonamide Derivatives     REACTION: unspecified  . Tramadol Hcl     REACTION: doesn't remember what happened but  couldn't tolerate  . Venlafaxine     REACTION: unspecified    Past Medical History  Diagnosis Date  . Chronic fatigue   . Obstipation   . Depression   . Hyperlipemia   . GERD (gastroesophageal reflux disease)   . PUD (peptic ulcer disease)   . Migraine   . Osteopenia     Past Surgical History  Procedure Laterality Date  . Appendectomy  2005  . Cholecystectomy  2004  . Total abdominal hysterectomy w/ bilateral salpingoophorectomy  1986    Family History  Problem Relation Age of Onset  . Heart disease Mother     AMI  . Stroke Mother   . Heart disease Father   . Hypertension Sister   . Hypertension Sister   . Heart disease Sister     MI  . Diabetes Sister     History   Social History  . Marital Status: Divorced    Spouse Name: N/A    Number of Children: 1  . Years of Education: N/A   Occupational History  . DISABLED    Social History Main Topics  . Smoking status: Current Every Day Smoker -- 1.00 packs/day for  40 years    Types: Cigarettes  . Smokeless tobacco: Never Used     Comment: has cut down to ~6 cigarettes per day  . Alcohol Use: No  . Drug Use: No  . Sexual Activity: Not on file   Other Topics Concern  . Not on file   Social History Narrative   Living alone again      No living will   Daughter should make health care decisions   Would accept resuscitation but no prolonged artificial ventilation   No tube feeds if cognitively unaware   Review of Systems Had some abdominal bloating still Eating okay    Objective:   Physical Exam  Constitutional: She appears well-developed and well-nourished. No distress.  Neck:  Tenderness along both trapezius muscles Flexion/extension pretty good  Limited tilt and rotation both ways  Musculoskeletal:  Normal ROM of shoulders  Neurological:  No focal arm weakness          Assessment & Plan:

## 2013-06-19 NOTE — Progress Notes (Signed)
Pre visit review using our clinic review tool, if applicable. No additional management support is needed unless otherwise documented below in the visit note. 

## 2013-06-19 NOTE — Telephone Encounter (Signed)
Pt having pain in neck and both shoulders for 3 weeks. Upper and lower abd feels swollen; started last night. Pt having problem with constipation.No CP,SOB, H/A or dizziness. Pain in neck and shoulders worsened 2 days ago; pain level now is 8 upon movement. Pt scheduled appt today with Dr Silvio Pate at 12:15. If pt condition changes or worsens pt will cb to office.

## 2013-06-19 NOTE — Patient Instructions (Signed)
Please start vitamin D 1000 international units daily. Check for therapeutic massage practitioner in your area for your neck

## 2013-06-19 NOTE — Assessment & Plan Note (Signed)
Clearly seems to be muscular Discussed continuing heat Try massage---got [employer] listing for her

## 2013-07-07 ENCOUNTER — Other Ambulatory Visit: Payer: Self-pay | Admitting: Internal Medicine

## 2013-07-08 NOTE — Telephone Encounter (Signed)
Last filled 06/13/13, too soon?

## 2013-07-08 NOTE — Telephone Encounter (Signed)
Spoke with patient and advised results   

## 2013-07-08 NOTE — Telephone Encounter (Signed)
Yes, let her know she is not due till the end of the week

## 2013-07-16 ENCOUNTER — Other Ambulatory Visit: Payer: Self-pay | Admitting: Internal Medicine

## 2013-07-16 NOTE — Telephone Encounter (Signed)
06/13/13 

## 2013-07-17 NOTE — Telephone Encounter (Signed)
Okay #120 x 0

## 2013-07-17 NOTE — Telephone Encounter (Signed)
rx called into pharmacy

## 2013-08-12 ENCOUNTER — Other Ambulatory Visit: Payer: Self-pay

## 2013-08-12 MED ORDER — OMEPRAZOLE 20 MG PO CPDR: 20.0000 mg | DELAYED_RELEASE_CAPSULE | Freq: Every day | ORAL | Status: DC | PRN

## 2013-08-12 NOTE — Telephone Encounter (Signed)
Pt left v/m requesting refill on omeprazole; Dr Amedeo Plenty started pt on omeprazole but pt wants to know if Dr Silvio Pate will do refills.(Dr Silvio Pate has not prescribed before) pt has one pill left. Pt request cb.[facility].

## 2013-08-13 ENCOUNTER — Other Ambulatory Visit: Payer: Self-pay | Admitting: Internal Medicine

## 2013-08-14 NOTE — Telephone Encounter (Signed)
Okay #120 x 0

## 2013-08-14 NOTE — Telephone Encounter (Signed)
rx called into pharmacy

## 2013-08-14 NOTE — Telephone Encounter (Signed)
07/17/13 

## 2013-09-04 ENCOUNTER — Other Ambulatory Visit: Payer: Self-pay

## 2013-09-04 NOTE — Telephone Encounter (Signed)
Pt left v/m requesting rx hydrocodone apap. Call when ready for pick up.  

## 2013-09-05 MED ORDER — HYDROCODONE-ACETAMINOPHEN 5-325 MG PO TABS: 1.0000 | ORAL_TABLET | Freq: Three times a day (TID) | ORAL | Status: DC | PRN

## 2013-09-05 NOTE — Telephone Encounter (Signed)
Spoke with patient and advised rx ready for pick-up and it will be at the front desk.  

## 2013-09-22 ENCOUNTER — Other Ambulatory Visit: Payer: Self-pay | Admitting: Internal Medicine

## 2013-10-14 ENCOUNTER — Other Ambulatory Visit: Payer: Self-pay | Admitting: Internal Medicine

## 2013-10-21 ENCOUNTER — Ambulatory Visit (INDEPENDENT_AMBULATORY_CARE_PROVIDER_SITE_OTHER): Payer: Medicare Other | Admitting: Internal Medicine

## 2013-10-21 ENCOUNTER — Encounter: Payer: Self-pay | Admitting: Internal Medicine

## 2013-10-21 VITALS — BP 110/60 | HR 56 | Temp 97.7°F | Ht 60.0 in | Wt 123.0 lb

## 2013-10-21 DIAGNOSIS — F329 Major depressive disorder, single episode, unspecified: Secondary | ICD-10-CM

## 2013-10-21 DIAGNOSIS — F3289 Other specified depressive episodes: Secondary | ICD-10-CM

## 2013-10-21 DIAGNOSIS — Z0000 Encounter for general adult medical examination without abnormal findings: Secondary | ICD-10-CM

## 2013-10-21 DIAGNOSIS — Z780 Asymptomatic menopausal state: Secondary | ICD-10-CM

## 2013-10-21 DIAGNOSIS — Z23 Encounter for immunization: Secondary | ICD-10-CM

## 2013-10-21 DIAGNOSIS — Z7189 Other specified counseling: Secondary | ICD-10-CM

## 2013-10-21 DIAGNOSIS — E785 Hyperlipidemia, unspecified: Secondary | ICD-10-CM

## 2013-10-21 DIAGNOSIS — F411 Generalized anxiety disorder: Secondary | ICD-10-CM

## 2013-10-21 DIAGNOSIS — G43909 Migraine, unspecified, not intractable, without status migrainosus: Secondary | ICD-10-CM

## 2013-10-21 LAB — CBC WITH DIFFERENTIAL/PLATELET
Basophils Absolute: 0 10*3/uL (ref 0.0–0.1)
Basophils Relative: 0.5 % (ref 0.0–3.0)
Eosinophils Absolute: 0.1 10*3/uL (ref 0.0–0.7)
Eosinophils Relative: 1 % (ref 0.0–5.0)
HCT: 37.5 % (ref 36.0–46.0)
Hemoglobin: 12.7 g/dL (ref 12.0–15.0)
Lymphocytes Relative: 26.3 % (ref 12.0–46.0)
Lymphs Abs: 2.1 10*3/uL (ref 0.7–4.0)
MCHC: 33.9 g/dL (ref 30.0–36.0)
MCV: 92.7 fl (ref 78.0–100.0)
Monocytes Absolute: 0.6 10*3/uL (ref 0.1–1.0)
Monocytes Relative: 6.8 % (ref 3.0–12.0)
Neutro Abs: 5.3 10*3/uL (ref 1.4–7.7)
Neutrophils Relative %: 65.4 % (ref 43.0–77.0)
Platelets: 289 10*3/uL (ref 150.0–400.0)
RBC: 4.05 Mil/uL (ref 3.87–5.11)
RDW: 12.5 % (ref 11.5–15.5)
WBC: 8.1 10*3/uL (ref 4.0–10.5)

## 2013-10-21 LAB — T4, FREE: Free T4: 0.87 ng/dL (ref 0.60–1.60)

## 2013-10-21 LAB — COMPREHENSIVE METABOLIC PANEL
ALT: 17 U/L (ref 0–35)
AST: 26 U/L (ref 0–37)
Albumin: 4.2 g/dL (ref 3.5–5.2)
Alkaline Phosphatase: 112 U/L (ref 39–117)
BUN: 11 mg/dL (ref 6–23)
CO2: 29 mEq/L (ref 19–32)
Calcium: 10.1 mg/dL (ref 8.4–10.5)
Chloride: 103 mEq/L (ref 96–112)
Creatinine, Ser: 0.8 mg/dL (ref 0.4–1.2)
GFR: 76.48 mL/min (ref >60.00)
Glucose, Bld: 91 mg/dL (ref 70–99)
Potassium: 4 mEq/L (ref 3.5–5.1)
Sodium: 139 mEq/L (ref 135–145)
Total Bilirubin: 0.5 mg/dL (ref 0.2–1.2)
Total Protein: 7.8 g/dL (ref 6.0–8.3)

## 2013-10-21 LAB — LIPID PANEL
Cholesterol: 291 mg/dL — ABNORMAL HIGH (ref 0–200)
HDL: 46.9 mg/dL (ref >39.00)
LDL Cholesterol: 212 mg/dL — ABNORMAL HIGH (ref 0–99)
NonHDL: 244.1
Total CHOL/HDL Ratio: 6
Triglycerides: 163 mg/dL — ABNORMAL HIGH (ref 0.0–149.0)
VLDL: 32.6 mg/dL (ref 0.0–40.0)

## 2013-10-21 NOTE — Assessment & Plan Note (Signed)
Better since on beta blocker

## 2013-10-21 NOTE — Assessment & Plan Note (Signed)
Needs pneumovax today then prevnar in a year Wait 1 month for flu shot UTD on cancer screening--mammo and colon No Paps due to age

## 2013-10-21 NOTE — Assessment & Plan Note (Signed)
Discussed primary prevention Given her chronic pain, will hold off (and no vascular disease)

## 2013-10-21 NOTE — Assessment & Plan Note (Signed)
See social history 

## 2013-10-21 NOTE — Progress Notes (Signed)
Pre visit review using our clinic review tool, if applicable. No additional management support is needed unless otherwise documented below in the visit note. 

## 2013-10-21 NOTE — Progress Notes (Signed)
Subjective:    Patient ID: Lindsey Ward, female    DOB: [birth year], 63 y.o.   MRN: [id number]  HPI Here for physical [place] [facility] to chiropractor hopefully for acupuncture Did lots of x-rays and told she needs Rx for $4200 Does have appt with massage therapist--I told her this is probably the best idea Also some relief from OTC gel  Quit smoking!! Off since July 15th On nicotine patches Tough time to quit---sister is dying now (home with hospice)  Mood is variable Had been okay other than her stress Sleeps okay with gabapentin and alprazolam  Has pain in forearms and calves--thinks it is her bones May just be the fibromyalgia  Current Outpatient Prescriptions on File Prior to Visit  Medication Sig Dispense Refill  . ALPRAZolam (XANAX) 1 MG tablet 1 TABLET BY MOUTH 3 TIMES A DAY  120 tablet  0  . atenolol (TENORMIN) 25 MG tablet TAKE 1 TABLET BY MOUTH EVERY DAY  30 tablet  11  . baclofen (LIORESAL) 10 MG tablet TAKE 1 TO 2 DAYS PER WEEK AS NEEDED FOR HEADACHE, AVOID DAILY USE  10 tablet  0  . cholecalciferol (VITAMIN D) 1000 UNITS tablet Take 1,000 Units by mouth daily.      . cyclobenzaprine (FLEXERIL) 10 MG tablet TAKE 1 TABLET BY MOUTH 3 TIMES A DAY AS NEEDED FOR MUSCLE SPASMS  30 tablet  0  . gabapentin (NEURONTIN) 600 MG tablet TAKE 4 TABLETS BY MOUTH AT BEDTIME  360 tablet  1  . HYDROcodone-acetaminophen (NORCO/VICODIN) 5-325 MG per tablet Take 1 tablet by mouth 3 (three) times daily as needed.  90 tablet  0  . omeprazole (PRILOSEC) 20 MG capsule TAKE 1 CAPSULE BY MOUTH TWICE A DAY  180 capsule  0   No current facility-administered medications on file prior to visit.    Allergies  Allergen Reactions  . Aspirin     REACTION: GI bleed  . Buspirone Hcl     REACTION: unspecified  . Ciprofloxacin     REACTION: unspecified  . Codeine   . Diazepam   . Duloxetine     REACTION: sz like activity  . Ibuprofen     REACTION: GI bleed  . Oxycodone-Acetaminophen     REACTION:  unspecified  . Pregabalin     REACTION: kept her up and confusion  . Propoxyphene Hcl   . Risperidone     REACTION: confusion  . Sulfonamide Derivatives     REACTION: unspecified  . Tramadol Hcl     REACTION: doesn't remember what happened but couldn't tolerate  . Venlafaxine     REACTION: unspecified    Past Medical History  Diagnosis Date  . Chronic fatigue   . Obstipation   . Depression   . Hyperlipemia   . GERD (gastroesophageal reflux disease)   . PUD (peptic ulcer disease)   . Migraine   . Osteopenia     Past Surgical History  Procedure Laterality Date  . Appendectomy  2005  . Cholecystectomy  2004  . Total abdominal hysterectomy w/ bilateral salpingoophorectomy  1986    Family History  Problem Relation Age of Onset  . Heart disease Mother     AMI  . Stroke Mother   . Heart disease Father   . Hypertension Sister   . Hypertension Sister   . Heart disease Sister     MI  . Diabetes Sister     History   Social History  . Marital Status: Divorced  Spouse Name: N/A    Number of Children: 1  . Years of Education: N/A   Occupational History  . DISABLED    Social History Main Topics  . Smoking status: Former Smoker -- 1.00 packs/day for 40 years    Types: Cigarettes    Quit date: 09/25/2013  . Smokeless tobacco: Never Used     Comment:    . Alcohol Use: No  . Drug Use: No  . Sexual Activity: Not on file   Other Topics Concern  . Not on file   Social History Narrative   Living alone again      No living will   Daughter should make health care decisions   Would accept resuscitation but no prolonged artificial ventilation.   No tube feeds if cognitively unaware   Review of Systems  Constitutional: Negative for fatigue and unexpected weight change.       Wears seat belt  HENT: Negative for dental problem, hearing loss and tinnitus.        Regular with dentist Will occasional awaken with feeling that throat is closing up and affecting  breathing  Eyes: Negative for visual disturbance.       No diplopia or unilateral vision loss  Respiratory: Negative for cough, chest tightness and shortness of breath.   Cardiovascular: Positive for palpitations. Negative for chest pain and leg swelling.  Gastrointestinal: Positive for constipation. Negative for nausea, vomiting, abdominal pain and blood in stool.       Trying probiotic-- seems to be helping bowels Rare heartburn  Endocrine: Positive for cold intolerance. Negative for heat intolerance.  Genitourinary: Negative for dysuria, hematuria and dyspareunia.  Musculoskeletal: Positive for arthralgias and back pain. Negative for joint swelling.  Skin: Negative for rash.       No suspicious lesions---sees derm regularly (rough spots on breasts though)  Allergic/Immunologic: Negative for environmental allergies and immunocompromised state.  Neurological: Negative for dizziness, syncope, weakness, light-headedness, numbness and headaches.       No headaches since on atenolol  Hematological: Negative for adenopathy. Does not bruise/bleed easily.  Psychiatric/Behavioral: Positive for dysphoric mood. Negative for sleep disturbance. The patient is nervous/anxious.        Objective:   Physical Exam  Constitutional: She is oriented to person, place, and time. She appears well-developed and well-nourished. No distress.  HENT:  Head: Normocephalic and atraumatic.  Right Ear: External ear normal.  Left Ear: External ear normal.  Mouth/Throat: Oropharynx is clear and moist. No oropharyngeal exudate.  Eyes: Conjunctivae and EOM are normal. Pupils are equal, round, and reactive to light.  Neck: Normal range of motion. Neck supple. No thyromegaly present.  Cardiovascular: Normal rate, regular rhythm, normal heart sounds and intact distal pulses.  Exam reveals no gallop.   No murmur heard. Pulmonary/Chest: Effort normal and breath sounds normal. No respiratory distress. She has no wheezes.  She has no rales.  Abdominal: Soft. There is no tenderness.  Genitourinary:  Mild cystic changes in bilateral breasts  Musculoskeletal: She exhibits no edema and no tenderness.  Lymphadenopathy:    She has no cervical adenopathy.  Neurological: She is alert and oriented to person, place, and time.  Skin: No rash noted. No erythema.  2 scaly areas--- on left breast and inbetween. Clearly not neoplastic but ?actinics (going to derm soon)  Psychiatric: She has a normal mood and affect. Her behavior is normal.          Assessment & Plan:

## 2013-10-21 NOTE — Assessment & Plan Note (Signed)
Fair control on her current regimen--mostly has been secondary to anxiety

## 2013-10-21 NOTE — Addendum Note (Signed)
Addended by: Despina Hidden on: 10/21/2013 05:34 PM   Modules accepted: Orders

## 2013-10-21 NOTE — Assessment & Plan Note (Signed)
Okay with the alprazolam

## 2013-10-23 ENCOUNTER — Telehealth: Payer: Self-pay

## 2013-10-23 ENCOUNTER — Encounter: Payer: Self-pay | Admitting: *Deleted

## 2013-10-23 NOTE — Telephone Encounter (Signed)
Spoke with patient and she states she feels much better today, but wanted to know if the pneumonia shot could cause her to be tired and not feel well?

## 2013-10-23 NOTE — Telephone Encounter (Signed)
It is not common but certainly possible--especially if she is already feeling better. Have her let us know if she starts feeling worse again

## 2013-10-23 NOTE — Telephone Encounter (Signed)
Pt left v/m; pt received pneumonia vaccine on 10/21/13; on 10/22/13 pt ran fever and pt did not feel like doing anything. Pt request cb.

## 2013-10-25 NOTE — Telephone Encounter (Signed)
.  left message to have patient return my call.  

## 2013-11-01 ENCOUNTER — Telehealth: Payer: Self-pay | Admitting: Internal Medicine

## 2013-11-01 NOTE — Telephone Encounter (Signed)
Pt would like to get a referral to dr cabell  in [place] for her neck pain

## 2013-11-02 NOTE — Telephone Encounter (Signed)
Please let her know that I dont' think a neurosurgeon is the best place to start with her neck---- perhaps a physiatrist or rheumatologist (I don't think she has surgical disease there---at least based on our recent visits)

## 2013-11-05 NOTE — Telephone Encounter (Signed)
Patient returned your call. Please call her back on her cell phone [phone].

## 2013-11-05 NOTE — Telephone Encounter (Signed)
Spoke with patient and advised results, pt will call Dr. Jefm Bryant to schedule and appt

## 2013-11-05 NOTE — Telephone Encounter (Signed)
.  left message to have patient return my call.  

## 2013-11-11 ENCOUNTER — Other Ambulatory Visit: Payer: Self-pay | Admitting: *Deleted

## 2013-11-11 MED ORDER — ATENOLOL 25 MG PO TABS: ORAL_TABLET | ORAL | Status: DC

## 2013-12-05 ENCOUNTER — Other Ambulatory Visit: Payer: Self-pay | Admitting: Internal Medicine

## 2013-12-05 NOTE — Telephone Encounter (Signed)
08/14/13 

## 2013-12-06 NOTE — Telephone Encounter (Signed)
rx called into pharmacy

## 2013-12-06 NOTE — Telephone Encounter (Signed)
Okay #120 x 0

## 2013-12-16 ENCOUNTER — Encounter: Payer: Self-pay | Admitting: Internal Medicine

## 2013-12-16 ENCOUNTER — Ambulatory Visit (INDEPENDENT_AMBULATORY_CARE_PROVIDER_SITE_OTHER): Payer: Medicare Other | Admitting: Internal Medicine

## 2013-12-16 VITALS — BP 110/64 | HR 61 | Temp 97.6°F | Resp 14 | Wt 127.2 lb

## 2013-12-16 DIAGNOSIS — Z23 Encounter for immunization: Secondary | ICD-10-CM

## 2013-12-16 DIAGNOSIS — R0781 Pleurodynia: Secondary | ICD-10-CM | POA: Insufficient documentation

## 2013-12-16 MED ORDER — HYDROCODONE-ACETAMINOPHEN 5-325 MG PO TABS: 1.0000 | ORAL_TABLET | Freq: Three times a day (TID) | ORAL | Status: DC | PRN

## 2013-12-16 NOTE — Assessment & Plan Note (Signed)
Lasted only about 5 hours Had a chill at the beginning but no clear fever No other respiratory symptoms Now feels fine Just worried since staying with terminally ill sister---but reassured that there is no evidence for an infection RR 12 by me also  Observation only

## 2013-12-16 NOTE — Progress Notes (Signed)
Pre visit review using our clinic review tool, if applicable. No additional management support is needed unless otherwise documented below in the visit note. 

## 2013-12-16 NOTE — Progress Notes (Signed)
Subjective:    Patient ID: Lindsey Ward, female    DOB: 21-Nov-1950, 63 y.o.   MRN: [id number]  HPI Noted fever---chills and shaking--- 4AM this morning Didn't take temperature Pain in right upper back--- may have been pleuritic No SOB No cough  No sore throat  No nasal congestion Slight left ear pain  Feels better now Worried about pneumonia--since this has happened before  Took 2 ibuprofen around 11AM--- the pain had been gone for a while by then though  Current Outpatient Prescriptions on File Prior to Visit  Medication Sig Dispense Refill  . ALPRAZolam (XANAX) 1 MG tablet TAKE 1 TABLET BY MOUTH 3 TIMES A DAY  120 tablet  0  . atenolol (TENORMIN) 25 MG tablet TAKE 1 TABLET BY MOUTH EVERY DAY  90 tablet  1  . baclofen (LIORESAL) 10 MG tablet TAKE 1 TO 2 DAYS PER WEEK AS NEEDED FOR HEADACHE, AVOID DAILY USE  10 tablet  0  . cholecalciferol (VITAMIN D) 1000 UNITS tablet Take 1,000 Units by mouth daily.      . cyclobenzaprine (FLEXERIL) 10 MG tablet TAKE 1 TABLET BY MOUTH 3 TIMES A DAY AS NEEDED FOR MUSCLE SPASMS  30 tablet  0  . gabapentin (NEURONTIN) 600 MG tablet TAKE 4 TABLETS BY MOUTH AT BEDTIME  360 tablet  1  . HYDROcodone-acetaminophen (NORCO/VICODIN) 5-325 MG per tablet Take 1 tablet by mouth 3 (three) times daily as needed.  90 tablet  0  . omeprazole (PRILOSEC) 20 MG capsule TAKE 1 CAPSULE BY MOUTH TWICE A DAY  180 capsule  0   No current facility-administered medications on file prior to visit.    Allergies  Allergen Reactions  . Aspirin     REACTION: GI bleed  . Buspirone Hcl     REACTION: unspecified  . Ciprofloxacin     REACTION: unspecified  . Codeine   . Diazepam   . Duloxetine     REACTION: sz like activity  . Ibuprofen     REACTION: GI bleed  . Oxycodone-Acetaminophen     REACTION: unspecified  . Pregabalin     REACTION: kept her up and confusion  . Propoxyphene Hcl   . Risperidone     REACTION: confusion  . Sulfonamide Derivatives    REACTION: unspecified  . Tramadol Hcl     REACTION: doesn't remember what happened but couldn't tolerate  . Venlafaxine     REACTION: unspecified    Past Medical History  Diagnosis Date  . Chronic fatigue   . Obstipation   . Depression   . Hyperlipemia   . GERD (gastroesophageal reflux disease)   . PUD (peptic ulcer disease)   . Migraine   . Osteopenia     Past Surgical History  Procedure Laterality Date  . Appendectomy  2005  . Cholecystectomy  2004  . Total abdominal hysterectomy w/ bilateral salpingoophorectomy  1986    Family History  Problem Relation Age of Onset  . Heart disease Mother     AMI  . Stroke Mother   . Heart disease Father   . Hypertension Sister   . Hypertension Sister   . Heart disease Sister     MI  . Diabetes Sister     History   Social History  . Marital Status: Divorced    Spouse Name: N/A    Number of Children: 1  . Years of Education: N/A   Occupational History  . DISABLED    Social History Main  Topics  . Smoking status: Former Smoker -- 1.00 packs/day for 40 years    Types: Cigarettes    Quit date: 09/25/2013  . Smokeless tobacco: Never Used     Comment:    . Alcohol Use: No  . Drug Use: No  . Sexual Activity: Not on file   Other Topics Concern  . Not on file   Social History Narrative   Living alone again      No living will   Daughter should make health care decisions   Would accept resuscitation but no prolonged artificial ventilation.   No tube feeds if cognitively unaware   Review of Systems Staying with dying sister--has lung cancer Eating okay Did stop smoking  July 15th     Objective:   Physical Exam  Constitutional: She appears well-developed and well-nourished. No distress.  HENT:  Nose: Nose normal.  Mouth/Throat: Oropharynx is clear and moist. No oropharyngeal exudate.  TMs normal   Neck: Normal range of motion. Neck supple. No thyromegaly present.  Pulmonary/Chest: Effort normal and breath  sounds normal. No respiratory distress. She has no wheezes. She has no rales. She exhibits no tenderness.  No dullness to percussion  Lymphadenopathy:    She has no cervical adenopathy.          Assessment & Plan:

## 2013-12-16 NOTE — Addendum Note (Signed)
Addended by: Emelia Salisbury C on: 12/16/2013 05:27 PM   Modules accepted: Orders

## 2014-01-14 ENCOUNTER — Other Ambulatory Visit: Payer: Self-pay | Admitting: Internal Medicine

## 2014-02-14 ENCOUNTER — Ambulatory Visit: Payer: Self-pay | Admitting: Internal Medicine

## 2014-02-19 ENCOUNTER — Ambulatory Visit (INDEPENDENT_AMBULATORY_CARE_PROVIDER_SITE_OTHER): Payer: Medicare Other | Admitting: Family Medicine

## 2014-02-19 ENCOUNTER — Encounter: Payer: Self-pay | Admitting: Family Medicine

## 2014-02-19 VITALS — BP 108/76 | HR 59 | Temp 97.8°F | Ht 60.0 in | Wt 127.0 lb

## 2014-02-19 DIAGNOSIS — G8929 Other chronic pain: Secondary | ICD-10-CM

## 2014-02-19 DIAGNOSIS — M542 Cervicalgia: Secondary | ICD-10-CM

## 2014-02-19 MED ORDER — PREDNISONE 20 MG PO TABS: ORAL_TABLET | ORAL | Status: DC

## 2014-02-19 NOTE — Progress Notes (Signed)
Dr. Frederico Hamman T. Britian Jentz, MD, [facility] [place], [address] Phone: [phone] Fax: [phone]  02/19/2014  Patient: Lindsey Ward, MRN: [id number], DOB: 04-19-1950, 63 y.o.  Primary Physician:  Viviana Simpler, MD  Chief Complaint: Neck Pain   Subjective:   Lindsey Ward is a 63 y.o. very pleasant female patient who presents with the following:  The patient is here for evaluation and discussion about pain in her neck. She is not the best historian, and initially she tells me she has some relatively acute back pain, but after we have been talking for 15 or 20 minutes, she has been having problems with her neck and back for at least 3-5 years.  Her past medical history contains references to both fibromyalgia as well as chronic fatigue.  She is currently taking some ibuprofen.  She also is taking gabapentin 1200 mg by mouth twice a day.  On her chart it is written as if she takes is 2400 mg daily at bedtime.  She also takes some Xanax 1 mg 3 times a day, and she also takes Norco.  She has been having pain mostly on the nape of her neck, and she recently saw Dr. Birdie Sons the chiropractor.  Reportedly she had some degenerative disc disease and some spondyloarthropathy in her neck, but I do not have these films to review.  After working with him some, and she did not really seem to get much benefit.  She thinks that she has an upcoming appointment with her rheumatologist, Dr. Virgilio Belling, but she is not entirely sure.  She also thinks that she may be having a upcoming appointment with a different physician, but she is not clear if this is an orthopedic surgeon, neurosurgeon, or a physiatrist.  Neck, mostly in the posterior of her neck.   Saw Dr. Birdie Sons at some point.  Upcoming appointment with Precious Reel.   Has been having some problems with her neck for one year.  PT - a long time ago, 3 year ago Chiropractor, Dr. Birdie Sons.     No procedures.    Past Medical History, Surgical History, Social History, Family History, Problem List, Medications, and Allergies have been reviewed and updated if relevant.  GEN: no acute illness or fever CV: No chest pain or shortness of breath MSK: detailed above Neuro: neurological signs are described above ROS O/w per HPI  Objective:   BP 108/76 mmHg  Pulse 59  Temp(Src) 97.8 F (36.6 C) (Oral)  Ht 5' (1.524 m)  Wt 127 lb (57.607 kg)  BMI 24.80 kg/m2  SpO2 98%   GEN: Well-developed,well-nourished,in no acute distress; alert,appropriate and cooperative throughout examination HEENT: Normocephalic and atraumatic without obvious abnormalities. Ears, externally no deformities PULM: Breathing comfortably in no respiratory distress EXT: No clubbing, cyanosis, or edema PSYCH: Normally interactive. Cooperative during the interview. Pleasant. Friendly and conversant. Not anxious or depressed appearing. Normal, full affect.  CERVICAL SPINE EXAM Range of motion: Flexion, extension, lateral bending, and rotation: 40-50 percent loss of motion Pain with terminal motion: yes Spinous Processes: NT SCM: NT Upper paracervical muscles: diffusely tender Upper traps:  Tender, but less C5-T1 intact, sensation and motor   Radiology:   Assessment and Plan:   Chronic neck pain   This is a challenging case.  The patient likely has significant degenerative disc disease and spondyloarthropathy, but I do not have the patient's films to review myself.  I think it is worthwhile trying  to do a pulse doses of steroids to see if this helps.  We will also increase her Flexeril dosing to 3 times a day dosing at a lower dose.  Recommended that the patient take her Neurontin at a lower dose at 1200 mg but space it out to twice a day dosing.  Recommended formal physical therapy given her significant loss of motion, but she declined  She may be following up with Dr. Sharlet Salina, but she is not  fully clear.  I recommended that if he is the gentleman that she has been set up to see that he could offer her potentially some other options or advice.  New Prescriptions   PREDNISONE (DELTASONE) 20 MG TABLET    2 tabs po for 5 days, then 1 tab po for 5 days   Patient Instructions  Increase Flexeril three times a day. 1/2 tablet or 1 tablet three times a day     Signed,  Maylea Soria T. Bellah Alia, MD   Patient's Medications  New Prescriptions   PREDNISONE (DELTASONE) 20 MG TABLET    2 tabs po for 5 days, then 1 tab po for 5 days  Previous Medications   ALPRAZOLAM (XANAX) 1 MG TABLET    TAKE 1 TABLET BY MOUTH 3 TIMES A DAY   ASPIRIN EC 81 MG TABLET    Take by mouth.   ATENOLOL (TENORMIN) 25 MG TABLET    TAKE 1 TABLET BY MOUTH EVERY DAY   CHOLECALCIFEROL (VITAMIN D) 1000 UNITS TABLET    Take 1,000 Units by mouth daily.   CYCLOBENZAPRINE (FLEXERIL) 10 MG TABLET    TAKE 1 TABLET BY MOUTH 3 TIMES A DAY AS NEEDED FOR MUSCLE SPASMS   GABAPENTIN (NEURONTIN) 600 MG TABLET    TAKE 4 TABLETS BY MOUTH AT BEDTIME   HYDROCODONE-ACETAMINOPHEN (NORCO/VICODIN) 5-325 MG PER TABLET    Take 1 tablet by mouth 3 (three) times daily as needed.   IBUPROFEN (ADVIL,MOTRIN) 200 MG TABLET    Take 400 mg by mouth every 6 (six) hours as needed.  Modified Medications   Modified Medication Previous Medication   OMEPRAZOLE (PRILOSEC) 20 MG CAPSULE omeprazole (PRILOSEC) 20 MG capsule      TAKE 1 CAPSULE BY MOUTH TWICE A DAY    TAKE 1 CAPSULE BY MOUTH TWICE A DAY  Discontinued Medications   BACLOFEN (LIORESAL) 10 MG TABLET    TAKE 1 TO 2 DAYS PER WEEK AS NEEDED FOR HEADACHE, AVOID DAILY USE

## 2014-02-19 NOTE — Progress Notes (Signed)
Pre visit review using our clinic review tool, if applicable. No additional management support is needed unless otherwise documented below in the visit note. 

## 2014-02-19 NOTE — Patient Instructions (Signed)
Increase Flexeril three times a day. 1/2 tablet or 1 tablet three times a day

## 2014-02-21 ENCOUNTER — Other Ambulatory Visit: Payer: Self-pay | Admitting: Internal Medicine

## 2014-02-21 ENCOUNTER — Encounter: Payer: Self-pay | Admitting: Internal Medicine

## 2014-02-24 ENCOUNTER — Encounter: Payer: Self-pay | Admitting: *Deleted

## 2014-03-03 ENCOUNTER — Emergency Department: Payer: Self-pay | Admitting: Emergency Medicine

## 2014-03-03 LAB — BASIC METABOLIC PANEL
Anion Gap: 5 — ABNORMAL LOW (ref 7–16)
BUN: 9 mg/dL (ref 7–18)
Calcium, Total: 8.3 mg/dL — ABNORMAL LOW (ref 8.5–10.1)
Chloride: 105 mmol/L (ref 98–107)
Co2: 29 mmol/L (ref 21–32)
Creatinine: 0.99 mg/dL (ref 0.60–1.30)
EGFR (African American): >60
EGFR (Non-African Amer.): 60 — ABNORMAL LOW
Glucose: 107 mg/dL — ABNORMAL HIGH (ref 65–99)
Osmolality: 277 (ref 275–301)
Potassium: 3.8 mmol/L (ref 3.5–5.1)
Sodium: 139 mmol/L (ref 136–145)

## 2014-03-03 LAB — HEPATIC FUNCTION PANEL A (ARMC)
Albumin: 3.4 g/dL (ref 3.4–5.0)
Alkaline Phosphatase: 108 U/L
Bilirubin, Direct: <0.1 mg/dL (ref 0.0–0.2)
Bilirubin,Total: 0.2 mg/dL (ref 0.2–1.0)
SGOT(AST): 27 U/L (ref 15–37)
SGPT (ALT): 25 U/L
Total Protein: 7 g/dL (ref 6.4–8.2)

## 2014-03-03 LAB — TROPONIN I
Troponin-I: <0.02 ng/mL
Troponin-I: <0.02 ng/mL

## 2014-03-03 LAB — CBC
HCT: 36.5 % (ref 35.0–47.0)
HGB: 12.2 g/dL (ref 12.0–16.0)
MCH: 31.1 pg (ref 26.0–34.0)
MCHC: 33.5 g/dL (ref 32.0–36.0)
MCV: 93 fL (ref 80–100)
Platelet: 256 10*3/uL (ref 150–440)
RBC: 3.94 10*6/uL (ref 3.80–5.20)
RDW: 12.4 % (ref 11.5–14.5)
WBC: 9.7 10*3/uL (ref 3.6–11.0)

## 2014-03-03 LAB — LIPASE, BLOOD: Lipase: 151 U/L (ref 73–393)

## 2014-03-04 ENCOUNTER — Encounter: Payer: Self-pay | Admitting: Internal Medicine

## 2014-03-04 ENCOUNTER — Ambulatory Visit (INDEPENDENT_AMBULATORY_CARE_PROVIDER_SITE_OTHER): Payer: Medicare Other | Admitting: Internal Medicine

## 2014-03-04 VITALS — BP 120/60 | HR 64 | Temp 98.3°F | Wt 133.0 lb

## 2014-03-04 DIAGNOSIS — M542 Cervicalgia: Secondary | ICD-10-CM

## 2014-03-04 DIAGNOSIS — G8929 Other chronic pain: Secondary | ICD-10-CM | POA: Insufficient documentation

## 2014-03-04 DIAGNOSIS — G479 Sleep disorder, unspecified: Secondary | ICD-10-CM

## 2014-03-04 NOTE — Progress Notes (Signed)
Subjective:    Patient ID: Lindsey Ward, female    DOB: 20-Feb-1951, 63 y.o.   MRN: [id number]  HPI Here for follow up of neck problems Not getting any better Makes her face numb--in her cheeks Pain goes all the way around neck to face  Does have appt with Dr Sharlet Salina next week  Still on the gabapentin 600 bid Not sure this helps any  Pain down both arms and into jaw Limits her activity due to this Keeps using heat on it--- ??help Hydrocodone not even helping now  Current Outpatient Prescriptions on File Prior to Visit  Medication Sig Dispense Refill  . ALPRAZolam (XANAX) 1 MG tablet TAKE 1 TABLET BY MOUTH 3 TIMES A DAY 120 tablet 0  . aspirin EC 81 MG tablet Take by mouth.    . cholecalciferol (VITAMIN D) 1000 UNITS tablet Take 1,000 Units by mouth daily.    . cyclobenzaprine (FLEXERIL) 10 MG tablet TAKE 1 TABLET BY MOUTH 3 TIMES A DAY AS NEEDED FOR MUSCLE SPASMS 30 tablet 0  . gabapentin (NEURONTIN) 600 MG tablet TAKE 4 TABLETS BY MOUTH AT BEDTIME 360 tablet 1  . HYDROcodone-acetaminophen (NORCO/VICODIN) 5-325 MG per tablet Take 1 tablet by mouth 3 (three) times daily as needed. 90 tablet 0  . ibuprofen (ADVIL,MOTRIN) 200 MG tablet Take 400 mg by mouth every 6 (six) hours as needed.    Marland Kitchen omeprazole (PRILOSEC) 20 MG capsule TAKE 1 CAPSULE BY MOUTH TWICE A DAY 180 capsule 0   No current facility-administered medications on file prior to visit.    Allergies  Allergen Reactions  . Aspirin     REACTION: GI bleed  . Buspirone Hcl     REACTION: unspecified  . Ciprofloxacin     REACTION: unspecified  . Codeine   . Diazepam   . Duloxetine     REACTION: sz like activity  . Ibuprofen     REACTION: GI bleed  . Oxycodone-Acetaminophen     REACTION: unspecified  . Pregabalin     REACTION: kept her up and confusion  . Propoxyphene Hcl   . Risperidone     REACTION: confusion  . Sulfonamide Derivatives     REACTION: unspecified  . Tramadol Hcl     REACTION: doesn't  remember what happened but couldn't tolerate  . Venlafaxine     REACTION: unspecified    Past Medical History  Diagnosis Date  . Chronic fatigue   . Obstipation   . Depression   . Hyperlipemia   . GERD (gastroesophageal reflux disease)   . PUD (peptic ulcer disease)   . Migraine   . Osteopenia     Past Surgical History  Procedure Laterality Date  . Appendectomy  2005  . Cholecystectomy  2004  . Total abdominal hysterectomy w/ bilateral salpingoophorectomy  1986    Family History  Problem Relation Age of Onset  . Heart disease Mother     AMI  . Stroke Mother   . Heart disease Father   . Hypertension Sister   . Hypertension Sister   . Heart disease Sister     MI  . Diabetes Sister     History   Social History  . Marital Status: Married    Spouse Name: N/A    Number of Children: 1  . Years of Education: N/A   Occupational History  . DISABLED    Social History Main Topics  . Smoking status: Former Smoker -- 1.00 packs/day for 40 years  Types: Cigarettes    Quit date: 09/25/2013  . Smokeless tobacco: Never Used     Comment:    . Alcohol Use: No  . Drug Use: No  . Sexual Activity: Not on file   Other Topics Concern  . Not on file   Social History Narrative   Living alone again      No living will   Daughter should make health care decisions   Would accept resuscitation but no prolonged artificial ventilation.   No tube feeds if cognitively unaware   Review of Systems  Appetite is fine Weight is up some No jaw pain while eating (only radiation of neck pain)     Objective:   Physical Exam  Constitutional: She appears well-developed and well-nourished. No distress.  Neck:  Stiffness and spasm in trapezius muscle Mild decreased flexion but otherwise fairly good ROM No bony tenderness          Assessment & Plan:

## 2014-03-04 NOTE — Assessment & Plan Note (Signed)
Ongoing problems that go back for some time No clear cut weakness but still may have some radiculopathy Will await Dr Sharlet Salina' evaluation May do well with physical therapy, or more gabapentin Doubt this is surgical so reassured her (as her anxiety is acting up now)

## 2014-03-04 NOTE — Assessment & Plan Note (Signed)
At end mentioned waking up feeling like her breathing had stopped No daytime somnolence Discussed positioning for now

## 2014-03-04 NOTE — Progress Notes (Signed)
Pre visit review using our clinic review tool, if applicable. No additional management support is needed unless otherwise documented below in the visit note. 

## 2014-03-10 ENCOUNTER — Other Ambulatory Visit: Payer: Self-pay | Admitting: Internal Medicine

## 2014-03-10 NOTE — Telephone Encounter (Signed)
12/06/13 xanax

## 2014-03-11 ENCOUNTER — Encounter: Payer: Self-pay | Admitting: Internal Medicine

## 2014-03-11 NOTE — Telephone Encounter (Signed)
Approved: #[id number] x 0 

## 2014-03-11 NOTE — Telephone Encounter (Signed)
rx called into pharmacy

## 2014-03-28 ENCOUNTER — Other Ambulatory Visit: Payer: Self-pay

## 2014-03-28 NOTE — Telephone Encounter (Signed)
Pt left v/m requesting rx hydrocodone apap. Call when ready for pick up. Pt last seen 03/04/14.

## 2014-03-29 NOTE — Telephone Encounter (Signed)
Approved: okay #[id number] x 0 Please prepare for me

## 2014-03-31 ENCOUNTER — Other Ambulatory Visit: Payer: Self-pay | Admitting: *Deleted

## 2014-03-31 MED ORDER — HYDROCODONE-ACETAMINOPHEN 5-325 MG PO TABS: 1.0000 | ORAL_TABLET | Freq: Three times a day (TID) | ORAL | Status: DC | PRN

## 2014-03-31 NOTE — Telephone Encounter (Signed)
Spoke with patient and advised rx ready for pick-up and it will be at the front desk.  

## 2014-03-31 NOTE — Telephone Encounter (Signed)
12/16/13 

## 2014-03-31 NOTE — Telephone Encounter (Signed)
Duplicate request

## 2014-04-01 ENCOUNTER — Ambulatory Visit: Payer: Self-pay | Admitting: Physical Medicine and Rehabilitation

## 2014-04-01 DIAGNOSIS — M5031 Other cervical disc degeneration,  high cervical region: Secondary | ICD-10-CM | POA: Diagnosis not present

## 2014-04-01 DIAGNOSIS — M5032 Other cervical disc degeneration, mid-cervical region: Secondary | ICD-10-CM | POA: Diagnosis not present

## 2014-04-01 DIAGNOSIS — Z79899 Other long term (current) drug therapy: Secondary | ICD-10-CM | POA: Diagnosis not present

## 2014-04-01 DIAGNOSIS — M5382 Other specified dorsopathies, cervical region: Secondary | ICD-10-CM | POA: Diagnosis not present

## 2014-04-10 DIAGNOSIS — M5030 Other cervical disc degeneration, unspecified cervical region: Secondary | ICD-10-CM | POA: Diagnosis not present

## 2014-04-10 DIAGNOSIS — M5412 Radiculopathy, cervical region: Secondary | ICD-10-CM | POA: Diagnosis not present

## 2014-04-17 DIAGNOSIS — M9905 Segmental and somatic dysfunction of pelvic region: Secondary | ICD-10-CM | POA: Diagnosis not present

## 2014-04-17 DIAGNOSIS — M9901 Segmental and somatic dysfunction of cervical region: Secondary | ICD-10-CM | POA: Diagnosis not present

## 2014-04-30 DIAGNOSIS — M5012 Cervical disc disorder with radiculopathy, mid-cervical region: Secondary | ICD-10-CM | POA: Diagnosis not present

## 2014-04-30 DIAGNOSIS — M25551 Pain in right hip: Secondary | ICD-10-CM | POA: Diagnosis not present

## 2014-04-30 DIAGNOSIS — M9905 Segmental and somatic dysfunction of pelvic region: Secondary | ICD-10-CM | POA: Diagnosis not present

## 2014-04-30 DIAGNOSIS — M9901 Segmental and somatic dysfunction of cervical region: Secondary | ICD-10-CM | POA: Diagnosis not present

## 2014-04-30 DIAGNOSIS — M9902 Segmental and somatic dysfunction of thoracic region: Secondary | ICD-10-CM | POA: Diagnosis not present

## 2014-05-05 DIAGNOSIS — M9901 Segmental and somatic dysfunction of cervical region: Secondary | ICD-10-CM | POA: Diagnosis not present

## 2014-05-05 DIAGNOSIS — M9902 Segmental and somatic dysfunction of thoracic region: Secondary | ICD-10-CM | POA: Diagnosis not present

## 2014-05-05 DIAGNOSIS — M5012 Cervical disc disorder with radiculopathy, mid-cervical region: Secondary | ICD-10-CM | POA: Diagnosis not present

## 2014-05-05 DIAGNOSIS — M25551 Pain in right hip: Secondary | ICD-10-CM | POA: Diagnosis not present

## 2014-05-05 DIAGNOSIS — M9905 Segmental and somatic dysfunction of pelvic region: Secondary | ICD-10-CM | POA: Diagnosis not present

## 2014-05-12 ENCOUNTER — Telehealth: Payer: Self-pay | Admitting: *Deleted

## 2014-05-12 DIAGNOSIS — M9902 Segmental and somatic dysfunction of thoracic region: Secondary | ICD-10-CM | POA: Diagnosis not present

## 2014-05-12 DIAGNOSIS — M9905 Segmental and somatic dysfunction of pelvic region: Secondary | ICD-10-CM | POA: Diagnosis not present

## 2014-05-12 DIAGNOSIS — M9901 Segmental and somatic dysfunction of cervical region: Secondary | ICD-10-CM | POA: Diagnosis not present

## 2014-05-12 DIAGNOSIS — M25551 Pain in right hip: Secondary | ICD-10-CM | POA: Diagnosis not present

## 2014-05-12 DIAGNOSIS — M5012 Cervical disc disorder with radiculopathy, mid-cervical region: Secondary | ICD-10-CM | POA: Diagnosis not present

## 2014-05-12 NOTE — Telephone Encounter (Signed)
Advised patient to try cutting medication in half an see if that helps.  Advised her to also make a follow up appointment with Dr Sharlet Salina.  Patient will do this.

## 2014-05-12 NOTE — Telephone Encounter (Signed)
Patient called stating that she is taking Atenolol for her headaches and is having side effects. Patient stated that read the brochure that came with it and she complains of being dizzy at times, mood swings, depression, swelling,retaining fluid and weight gain. Patient wants to know should she come off of this and switched to something else?  Pharmacy[facility]

## 2014-05-12 NOTE — Telephone Encounter (Signed)
She probably needs to discuss this with Dr Sharlet Salina but it may be reasonable for her to try cutting them in half first---and see if that helps without the side effects. If that doesn't make things better, she should stop the med (I would need to see her to discuss alternatives or she can talk to Dr Sharlet Salina)

## 2014-05-14 DIAGNOSIS — M9905 Segmental and somatic dysfunction of pelvic region: Secondary | ICD-10-CM | POA: Diagnosis not present

## 2014-05-14 DIAGNOSIS — M5012 Cervical disc disorder with radiculopathy, mid-cervical region: Secondary | ICD-10-CM | POA: Diagnosis not present

## 2014-05-14 DIAGNOSIS — M9901 Segmental and somatic dysfunction of cervical region: Secondary | ICD-10-CM | POA: Diagnosis not present

## 2014-05-14 DIAGNOSIS — M9902 Segmental and somatic dysfunction of thoracic region: Secondary | ICD-10-CM | POA: Diagnosis not present

## 2014-05-14 DIAGNOSIS — M25551 Pain in right hip: Secondary | ICD-10-CM | POA: Diagnosis not present

## 2014-05-18 ENCOUNTER — Other Ambulatory Visit: Payer: Self-pay | Admitting: Internal Medicine

## 2014-05-23 ENCOUNTER — Other Ambulatory Visit: Payer: Self-pay | Admitting: Internal Medicine

## 2014-05-29 ENCOUNTER — Encounter: Payer: Self-pay | Admitting: Internal Medicine

## 2014-05-29 ENCOUNTER — Ambulatory Visit (INDEPENDENT_AMBULATORY_CARE_PROVIDER_SITE_OTHER): Payer: Medicare Other | Admitting: Internal Medicine

## 2014-05-29 VITALS — BP 118/60 | HR 81 | Temp 97.8°F | Wt 133.0 lb

## 2014-05-29 DIAGNOSIS — F4321 Adjustment disorder with depressed mood: Secondary | ICD-10-CM

## 2014-05-29 DIAGNOSIS — M542 Cervicalgia: Secondary | ICD-10-CM

## 2014-05-29 DIAGNOSIS — R609 Edema, unspecified: Secondary | ICD-10-CM | POA: Diagnosis not present

## 2014-05-29 DIAGNOSIS — F39 Unspecified mood [affective] disorder: Secondary | ICD-10-CM | POA: Diagnosis not present

## 2014-05-29 DIAGNOSIS — F4320 Adjustment disorder, unspecified: Secondary | ICD-10-CM

## 2014-05-29 MED ORDER — HYDROCODONE-ACETAMINOPHEN 5-325 MG PO TABS: 1.0000 | ORAL_TABLET | Freq: Three times a day (TID) | ORAL | Status: DC | PRN

## 2014-05-29 NOTE — Progress Notes (Signed)
Pre visit review using our clinic review tool, if applicable. No additional management support is needed unless otherwise documented below in the visit note. 

## 2014-05-29 NOTE — Patient Instructions (Signed)
Please call [facility] to speak to a bereavement counselor--- [phone].

## 2014-05-29 NOTE — Progress Notes (Signed)
Subjective:    Patient ID: Lindsey Ward, female    DOB: 03-28-1950, 64 y.o.   MRN: [id number]  HPI Not doing so well  Having swelling now Gets swelling in hands and feet Trouble getting rings on and off Toes seem swollen and ankles No breathing problems  Had to stop the BP med--atenolol Thought she had many of the side effects Her "Fuzziness" is gone since stopping it Only had 1 migraine since then--baclofen helped that  Feels she has ADHD Will start something but not finish Memory is poor--but doesn't seem like anything new Ongoing anxiety--not clearly any worse Her fiancee died in 2023-02-20 and then her sister No thoughts of dying or suicide  Ongoing pain in neck Feels her new generic hydrocodone is not working as well Saw Dr Sharlet Salina-- didn't do intervention Saw chiropractor-- not really helpful Does have some neck and arm symptoms ongoing  Current Outpatient Prescriptions on File Prior to Visit  Medication Sig Dispense Refill  . ALPRAZolam (XANAX) 1 MG tablet TAKE 1 TABLET BY MOUTH 3 TIMES A DAY 120 tablet 0  . aspirin EC 81 MG tablet Take by mouth.    . cholecalciferol (VITAMIN D) 1000 UNITS tablet Take 1,000 Units by mouth daily.    Marland Kitchen gabapentin (NEURONTIN) 600 MG tablet TAKE 4 TABLETS BY MOUTH AT BEDTIME 360 tablet 3  . HYDROcodone-acetaminophen (NORCO/VICODIN) 5-325 MG per tablet Take 1 tablet by mouth 3 (three) times daily as needed. 90 tablet 0  . ibuprofen (ADVIL,MOTRIN) 200 MG tablet Take 400 mg by mouth every 6 (six) hours as needed.    Marland Kitchen omeprazole (PRILOSEC) 20 MG capsule TAKE 1 CAPSULE BY MOUTH TWICE A DAY 180 capsule 3   No current facility-administered medications on file prior to visit.    Allergies  Allergen Reactions  . Aspirin     REACTION: GI bleed  . Buspirone Hcl     REACTION: unspecified  . Ciprofloxacin     REACTION: unspecified  . Codeine   . Diazepam   . Duloxetine     REACTION: sz like activity  . Ibuprofen     REACTION: GI  bleed  . Oxycodone-Acetaminophen     REACTION: unspecified  . Pregabalin     REACTION: kept her up and confusion  . Propoxyphene Hcl   . Risperidone     REACTION: confusion  . Sulfonamide Derivatives     REACTION: unspecified  . Tramadol Hcl     REACTION: doesn't remember what happened but couldn't tolerate  . Venlafaxine     REACTION: unspecified    Past Medical History  Diagnosis Date  . Chronic fatigue   . Obstipation   . Depression   . Hyperlipemia   . GERD (gastroesophageal reflux disease)   . PUD (peptic ulcer disease)   . Migraine   . Osteopenia     Past Surgical History  Procedure Laterality Date  . Appendectomy  2005  . Cholecystectomy  2004  . Total abdominal hysterectomy w/ bilateral salpingoophorectomy  1986    Family History  Problem Relation Age of Onset  . Heart disease Mother     AMI  . Stroke Mother   . Heart disease Father   . Hypertension Sister   . Hypertension Sister   . Heart disease Sister     MI  . Diabetes Sister     History   Social History  . Marital Status: Married    Spouse Name: N/A  . Number of Children:  1  . Years of Education: N/A   Occupational History  . DISABLED    Social History Main Topics  . Smoking status: Former Smoker -- 1.00 packs/day for 40 years    Types: Cigarettes    Quit date: 09/25/2013  . Smokeless tobacco: Never Used     Comment:    . Alcohol Use: No  . Drug Use: No  . Sexual Activity: Not on file   Other Topics Concern  . Not on file   Social History Narrative   Living alone again      No living will   Daughter should make health care decisions   Would accept resuscitation but no prolonged artificial ventilation.   No tube feeds if cognitively unaware   Review of Systems Had chest pain and went to ER last month--- no findings and sent home Eating is not great Weight is stable    Objective:   Physical Exam  Constitutional: She appears well-developed and well-nourished. No  distress.  Neck: Normal range of motion. Neck supple. No thyromegaly present.  Cardiovascular: Normal rate, regular rhythm, normal heart sounds and intact distal pulses.  Exam reveals no gallop.   No murmur heard. Pulmonary/Chest: Effort normal and breath sounds normal. No respiratory distress. She has no wheezes. She has no rales.  Abdominal: Soft.  Very mild generalized anxiety  Musculoskeletal:  No sig edema seen  Lymphadenopathy:    She has no cervical adenopathy.  Psychiatric:  Anxious but not depressed Normal speech and appearance          Assessment & Plan:

## 2014-05-29 NOTE — Assessment & Plan Note (Signed)
Minimal at most Discussed that meds are not a good idea Urged her to monitor her salt intake

## 2014-05-29 NOTE — Assessment & Plan Note (Signed)
Reviewed Dr Saralyn Pilar note Will continue the hydrocodone

## 2014-05-29 NOTE — Assessment & Plan Note (Signed)
Chronic dysthymia and anxiety Had been compensated till the grieving No med changes for now

## 2014-05-29 NOTE — Assessment & Plan Note (Signed)
Has had 2 big losses recently I think she is having somatization from this, and the distractability, etc I urged her to call for counseling from bereavement counselor

## 2014-05-31 ENCOUNTER — Other Ambulatory Visit: Payer: Self-pay | Admitting: Internal Medicine

## 2014-06-04 ENCOUNTER — Other Ambulatory Visit: Payer: Self-pay | Admitting: Internal Medicine

## 2014-06-10 ENCOUNTER — Other Ambulatory Visit: Payer: Self-pay | Admitting: Internal Medicine

## 2014-06-10 NOTE — Telephone Encounter (Signed)
rx called into pharmacy

## 2014-06-10 NOTE — Telephone Encounter (Signed)
03/11/2014 

## 2014-06-10 NOTE — Telephone Encounter (Signed)
Approved: #[id number] x 0 

## 2014-06-12 ENCOUNTER — Emergency Department
Admit: 2014-06-12 | Disposition: A | Discharge status: Home / Self Care | Payer: Self-pay | Admitting: Emergency Medicine

## 2014-06-12 ENCOUNTER — Telehealth: Payer: Self-pay | Admitting: Internal Medicine

## 2014-06-12 DIAGNOSIS — M4306 Spondylolysis, lumbar region: Secondary | ICD-10-CM | POA: Diagnosis not present

## 2014-06-12 DIAGNOSIS — Z720 Tobacco use: Secondary | ICD-10-CM | POA: Diagnosis not present

## 2014-06-12 DIAGNOSIS — K5900 Constipation, unspecified: Secondary | ICD-10-CM | POA: Diagnosis not present

## 2014-06-12 DIAGNOSIS — M47816 Spondylosis without myelopathy or radiculopathy, lumbar region: Secondary | ICD-10-CM | POA: Diagnosis not present

## 2014-06-12 DIAGNOSIS — Z79891 Long term (current) use of opiate analgesic: Secondary | ICD-10-CM | POA: Diagnosis not present

## 2014-06-12 DIAGNOSIS — M5136 Other intervertebral disc degeneration, lumbar region: Secondary | ICD-10-CM | POA: Diagnosis not present

## 2014-06-12 DIAGNOSIS — R1310 Dysphagia, unspecified: Secondary | ICD-10-CM | POA: Diagnosis not present

## 2014-06-12 LAB — COMPREHENSIVE METABOLIC PANEL
Albumin: 4.3 g/dL
Alkaline Phosphatase: 87 U/L
Anion Gap: 8 (ref 7–16)
BUN: 20 mg/dL
Bilirubin,Total: 0.3 mg/dL
Calcium, Total: 9 mg/dL
Chloride: 96 mmol/L — ABNORMAL LOW
Co2: 28 mmol/L
Creatinine: 0.97 mg/dL
EGFR (African American): >60
EGFR (Non-African Amer.): >60
Glucose: 88 mg/dL
Potassium: 3.8 mmol/L
SGOT(AST): 29 U/L
SGPT (ALT): 18 U/L
Sodium: 132 mmol/L — ABNORMAL LOW
Total Protein: 7.3 g/dL

## 2014-06-12 LAB — URINALYSIS, COMPLETE
Bilirubin,UR: NEGATIVE
Blood: NEGATIVE
Glucose,UR: NEGATIVE mg/dL (ref 0–75)
Ketone: NEGATIVE
Nitrite: NEGATIVE
Ph: 6 (ref 4.5–8.0)
Protein: NEGATIVE
RBC,UR: NONE SEEN /HPF (ref 0–5)
Specific Gravity: 1.005 (ref 1.003–1.030)
Squamous Epithelial: <1
WBC UR: 3 /HPF (ref 0–5)

## 2014-06-12 LAB — CBC
HCT: 38.3 % (ref 35.0–47.0)
HGB: 12.9 g/dL (ref 12.0–16.0)
MCH: 31.4 pg (ref 26.0–34.0)
MCHC: 33.7 g/dL (ref 32.0–36.0)
MCV: 93 fL (ref 80–100)
Platelet: 300 10*3/uL (ref 150–440)
RBC: 4.11 10*6/uL (ref 3.80–5.20)
RDW: 12.4 % (ref 11.5–14.5)
WBC: 8.5 10*3/uL (ref 3.6–11.0)

## 2014-06-12 LAB — CK: CK, Total: 59 U/L

## 2014-06-12 NOTE — Telephone Encounter (Signed)
Check on her tomorrow please

## 2014-06-12 NOTE — Telephone Encounter (Signed)
[facility]  Patient Name: Lindsey Ward  DOB: [birth year]    Initial Comment Caller states has back and hip and pain;    Nurse Assessment  Nurse: Justine Null, RN, Rodena Piety Date/Time (Eastern Time): 06/12/2014 3:47:05 PM  Confirm and document reason for call. If symptomatic, describe symptoms. ---Caller states has back and hip pain that started on yesterday and ahs been having to stay in the bed and has the pain running down her legs and has not injured her back  Has the patient traveled out of the country within the last 30 days? ---No  Does the patient require triage? ---Yes  Related visit to physician within the last 2 weeks? ---Yes  Does the PT have any chronic conditions? (i.e. diabetes, asthma, etc.) ---No     Guidelines    Guideline Title Affirmed Question Affirmed Notes  Back Pain [1] SEVERE back pain (e.g., excruciating) AND [2] sudden onset AND [3] age > 60 pain rated at 10/10   Final Disposition User   Go to ED Now Justine Null, RN, Rodena Piety

## 2014-06-12 NOTE — Telephone Encounter (Signed)
PLEASE NOTE: All timestamps contained within this report are represented as [place] Standard Time. CONFIDENTIALTY NOTICE: This fax transmission is intended only for the addressee. It contains information that is legally privileged, confidential or otherwise protected from use or disclosure. If you are not the intended recipient, you are strictly prohibited from reviewing, disclosing, copying using or disseminating any of this information or taking any action in reliance on or regarding this information. If you have received this fax in error, please notify us immediately by telephone so that we can arrange for its return to [place]. Phone: [phone], Toll-Free: [phone], Fax: [phone] Page: 1 of 2 Call Id: [id number] [place] Patient Name: Lindsey Ward Gender: Female DOB: 1950-09-06 Age: 63 Y 9 M 16 D Return Phone Number: [phone] (Primary), [phone] (Alternate) [place] [address] Client [facility] - Client Client Site [facility] - Day Physician Viviana Simpler Contact Type Call Call Type Triage / Clinical Relationship To Patient Self Appointment Disposition EMR Appointment Not Necessary Info pasted into Epic Yes Return Phone Number [phone] (Primary) Chief Complaint Back Pain - General Initial Comment Caller states has back and hip and pain; PreDisposition Home Care Nurse Assessment Nurse: Justine Null, RN, Rodena Piety Date/Time [name] Time): 06/12/2014 3:47:05 PM Confirm and document reason for call. If symptomatic, describe symptoms. ---Caller states has back and hip pain that started on yesterday and ahs been having to stay in the bed and has the pain running down her legs and has not injured her back Has the patient traveled out of the country within the last 30 days? ---No Does the patient require triage?  ---Yes Related visit to physician within the last 2 weeks? ---Yes Does the PT have any chronic conditions? (i.e. diabetes, asthma, etc.) ---No Guidelines Guideline Title Affirmed Question Affirmed Notes Nurse Date/Time (Eastern Time) Back Pain [1] SEVERE back pain (e.g., excruciating) AND [2] sudden onset AND [3] age > 60 pain rated at 10/10 Justine Null, [place], Rodena Piety 06/12/2014 3:48:55 PM Disp. Time [name] Time) Disposition Final User 06/12/2014 3:50:34 PM Go to ED Now Yes Justine Null, RN, Inis Sizer Understands: Yes Disagree/Comply: Comply PLEASE NOTE: All timestamps contained within this report are represented as [place] Standard Time. CONFIDENTIALTY NOTICE: This fax transmission is intended only for the addressee. It contains information that is legally privileged, confidential or otherwise protected from use or disclosure. If you are not the intended recipient, you are strictly prohibited from reviewing, disclosing, copying using or disseminating any of this information or taking any action in reliance on or regarding this information. If you have received this fax in error, please notify us immediately by telephone so that we can arrange for its return to [place]. Phone: [phone], Toll-Free: [phone], Fax: [phone] Page: 2 of 2 Call Id: [id number] Care Advice Given Per Guideline GO TO ED NOW: You need to be seen in the Emergency Department. Go to the ER at ___________ [place] now. Drive carefully. DRIVING: Another adult should drive. BRING MEDICINES: * Please bring a list of your current medicines when you go to the Emergency Department (ER). * It is also a good idea to bring the pill bottles too. This will help the doctor to make certain you are taking the right medicines and the right dose. CARE ADVICE given per Back Pain (Adult) guideline. After Care Instructions Given Call Event Type User Date / Time Description Referrals [facility] -  ED

## 2014-06-13 NOTE — Telephone Encounter (Signed)
.  left message to have patient return my call.  

## 2014-06-16 NOTE — Telephone Encounter (Signed)
Spoke to patient, she did got ER. They informed her that she has 3 bulging disc in her lower back. She was put on muscle relaxer, pain medication. She states that she is not hurting and does not feel she needs to be evaluated at Ortho. I told her to call Ortho if the pain comes back. She may also need to see PCP if she needs a referral per insurance.

## 2014-06-16 NOTE — Telephone Encounter (Signed)
noted 

## 2014-06-20 ENCOUNTER — Telehealth: Payer: Self-pay

## 2014-06-20 ENCOUNTER — Telehealth: Payer: Self-pay | Admitting: Family Medicine

## 2014-06-20 DIAGNOSIS — M5440 Lumbago with sciatica, unspecified side: Secondary | ICD-10-CM

## 2014-06-20 NOTE — Telephone Encounter (Signed)
Please check to see if Dr Arnoldo Morale' office has a referral ---and how soon she can get in (even if I need to make the referral)

## 2014-06-20 NOTE — Telephone Encounter (Addendum)
Taegen called back and in so much pain gave phone to [facility], pts sister and wants to know if can take Hydrocodone that pt already has instead of Ketorolac because Ketorolac is not at all effective with pts pain. Pt last took Ketorolac 10 mg at 2 pm today. Pt request cb today. Dr Silvio Pate is out of office but will send note to Dr Silvio Pate and Dr Glori Bickers. Advised pts sister if pt's pain worsens prior to cb to go to ED for further eval if needed.

## 2014-06-20 NOTE — Telephone Encounter (Signed)
Pt left v/m requesting cb to see if appt with Dr Arnoldo Morale has been made. Pt request cb ASAP due to pt being in pain.

## 2014-06-20 NOTE — Telephone Encounter (Signed)
Spoke to pt and advised per Dr Diona Browner. Pt verbally expressed understanding. I advised pt that she is not due for an additional norco Rx until 4/17, so to take it when she is in extreme pain and toradol otherwise.

## 2014-06-20 NOTE — Telephone Encounter (Signed)
Seen in ER . Needs referral to neurosurg, Dr Arnoldo Morale.

## 2014-06-20 NOTE — Telephone Encounter (Signed)
Pt left v/m requesting cb;entire back hurts and pain runs into both legs; pt is no better and pt wants to know if can get different meds. Pt was given ketorolac 10 mg to take one tab qid and tizanidine 4 mg taking 2 tabs po q8h prn (tizanidine (one tab)causes pt to feel very dizzy and in "[place]". Flexeril does not help pain either. Pt was seen [facility] on 06/12/2014 and was advised to f/u with Dr Newman Pies in [facility] but pt has not scheduled appt yet and wants to know if Dr Silvio Pate can schedule appt with Dr Arnoldo Morale. Pt said pain is so severe from bulging disc that pt wants to be seen by Dr Arnoldo Morale today. [facility]. Pt request cb. And [facility] ED records requested by fax.

## 2014-06-20 NOTE — Telephone Encounter (Signed)
Yes can take hydrocodone that she already has. Can take now as it is different than toradol.

## 2014-06-21 NOTE — Telephone Encounter (Signed)
Please check on her on Monday to see how she is doing

## 2014-06-23 NOTE — Telephone Encounter (Signed)
She will probably need appt here to discuss Rx since we didn't talk about her back at her last visit--and the CT scan is not that bad (so the neurosurgeon may not be excited about seeing her)

## 2014-06-23 NOTE — Telephone Encounter (Signed)
Called [facility] to inquire if they were asked to see this patient from [facility]. They were not contacted. Found medical records that were requested but no MRI or CT attached. [facility] radiology and they faxed over the patients CT Lumbar report.Faxed referral request to [facility] to see Dr Arnoldo Morale. Called the patient to let her know that the referral was faxed and gave the office's information. Patient asked about the pain medicine she was given and I deferred this question to Rena to help the patient.

## 2014-06-23 NOTE — Telephone Encounter (Signed)
Pt has appt tomorrow, she will bring all medications from the hospital

## 2014-06-24 ENCOUNTER — Encounter: Payer: Self-pay | Admitting: Internal Medicine

## 2014-06-24 ENCOUNTER — Ambulatory Visit (INDEPENDENT_AMBULATORY_CARE_PROVIDER_SITE_OTHER): Payer: Medicare Other | Admitting: Internal Medicine

## 2014-06-24 VITALS — BP 140/80 | HR 60 | Temp 97.6°F | Wt 136.0 lb

## 2014-06-24 DIAGNOSIS — M545 Low back pain, unspecified: Secondary | ICD-10-CM

## 2014-06-24 NOTE — Patient Instructions (Signed)
Please try the ibuprofen 200mg  --- 2 tabs up to three times a day. Stay off the riding mower. Try to walk every day and do core strengthening (like planks)

## 2014-06-24 NOTE — Assessment & Plan Note (Signed)
Reassured her Nothing worrisome on exam or CT scan Discussed that she should stop using the riding mower Same regimen---try the ibuprofen (since she hadn't been taking it) Work on Scientist, research (physical sciences)

## 2014-06-24 NOTE — Progress Notes (Signed)
Subjective:    Patient ID: Lindsey Ward, female    DOB: [birth year], 64 y.o.   MRN: [id number]  HPI Having ongoing back pain so went to ER Also pain into both legs Has trouble walking --staggers Neck pain Numbness on left face also  Was told to see neurosurgeon due to bulging discs Reviewed CT scan--but no major findings  Has bilateral back pain Was in bed for 2 days Then to ER Pain in both buttocks and then upper anterior thighs Some numbness and tingling also down anterior thighs Neck and facial symptoms only since ER (???nerves)  Cut the atenolol in half due to low blood pressures Does notice some slight return of her headaches Some dizziness still but ?some better on lower dose No syncope  Takes 4 of the 600mg  gabapentin at bedtime. Tried 1 in AM and 3 at night--- didn't think this was any better Uses the hydrocodone 2-3 times per day Hasn't been taking the ibuprofen  Didn't tolerate tizanidine or ketoroldac  Current Outpatient Prescriptions on File Prior to Visit  Medication Sig Dispense Refill  . ALPRAZolam (XANAX) 1 MG tablet TAKE 1 TABLET BY MOUTH 3 TIMES A DAY 120 tablet 0  . aspirin EC 81 MG tablet Take by mouth.    Marland Kitchen atenolol (TENORMIN) 25 MG tablet TAKE 1 TABLET BY MOUTH EVERY DAY 30 tablet 2  . cholecalciferol (VITAMIN D) 1000 UNITS tablet Take 1,000 Units by mouth daily.    Marland Kitchen gabapentin (NEURONTIN) 600 MG tablet TAKE 4 TABLETS BY MOUTH AT BEDTIME 360 tablet 3  . HYDROcodone-acetaminophen (NORCO/VICODIN) 5-325 MG per tablet Take 1 tablet by mouth 3 (three) times daily as needed. 90 tablet 0  . ibuprofen (ADVIL,MOTRIN) 200 MG tablet Take 400 mg by mouth every 6 (six) hours as needed.    Marland Kitchen omeprazole (PRILOSEC) 20 MG capsule TAKE 1 CAPSULE BY MOUTH TWICE A DAY 180 capsule 2   No current facility-administered medications on file prior to visit.    Allergies  Allergen Reactions  . Aspirin     REACTION: GI bleed  . Buspirone Hcl     REACTION: unspecified   . Ciprofloxacin     REACTION: unspecified  . Codeine   . Diazepam   . Duloxetine     REACTION: sz like activity  . Ibuprofen     REACTION: GI bleed  . Oxycodone-Acetaminophen     REACTION: unspecified  . Pregabalin     REACTION: kept her up and confusion  . Propoxyphene Hcl   . Risperidone     REACTION: confusion  . Sulfonamide Derivatives     REACTION: unspecified  . Tramadol Hcl     REACTION: doesn't remember what happened but couldn't tolerate  . Venlafaxine     REACTION: unspecified    Past Medical History  Diagnosis Date  . Chronic fatigue   . Obstipation   . Depression   . Hyperlipemia   . GERD (gastroesophageal reflux disease)   . PUD (peptic ulcer disease)   . Migraine   . Osteopenia     Past Surgical History  Procedure Laterality Date  . Appendectomy  2005  . Cholecystectomy  2004  . Total abdominal hysterectomy w/ bilateral salpingoophorectomy  1986    Family History  Problem Relation Age of Onset  . Heart disease Mother     AMI  . Stroke Mother   . Heart disease Father   . Hypertension Sister   . Hypertension Sister   . Heart  disease Sister     MI  . Diabetes Sister     History   Social History  . Marital Status: Married    Spouse Name: N/A  . Number of Children: 1  . Years of Education: N/A   Occupational History  . DISABLED    Social History Main Topics  . Smoking status: Former Smoker -- 1.00 packs/day for 40 years    Types: Cigarettes    Quit date: 09/25/2013  . Smokeless tobacco: Never Used     Comment:    . Alcohol Use: No  . Drug Use: No  . Sexual Activity: Not on file   Other Topics Concern  . Not on file   Social History Narrative   Living alone again      No living will   Daughter should make health care decisions   Would accept resuscitation but no prolonged artificial ventilation.   No tube feeds if cognitively unaware   Review of Systems Some urinary and bowel incontinence--- nothing new Not sleeping  well--chronic. Back does bother her in bed    Objective:   Physical Exam  Constitutional: She appears well-developed and well-nourished. No distress.  Musculoskeletal:  Pain area is lateral in both lumbar areas No spine tenderness Normal back flexion SLR negative bilaterally Normal ROM of hips  Neurological:  Normal gait No leg weakness          Assessment & Plan:

## 2014-06-25 ENCOUNTER — Telehealth: Payer: Self-pay | Admitting: Internal Medicine

## 2014-06-25 NOTE — Telephone Encounter (Signed)
Please cancel the referral to neurosurgeon--she doesn' t need this Confirm this with her as well thanks

## 2014-06-25 NOTE — Telephone Encounter (Signed)
Left message on home number to tell pt that she does not need referral.

## 2014-06-25 NOTE — Telephone Encounter (Signed)
Pt is confused about whether or not she needs to go to a back doctor.  She says she was told by Dr Silvio Pate that she did not need to see one, but then got a call about a referral to one.  Please call pt back at home number to clarify. Thanks.

## 2014-06-26 DIAGNOSIS — M545 Low back pain: Secondary | ICD-10-CM | POA: Diagnosis not present

## 2014-06-26 DIAGNOSIS — M4802 Spinal stenosis, cervical region: Secondary | ICD-10-CM | POA: Diagnosis not present

## 2014-06-26 DIAGNOSIS — M5412 Radiculopathy, cervical region: Secondary | ICD-10-CM | POA: Diagnosis not present

## 2014-06-26 DIAGNOSIS — M542 Cervicalgia: Secondary | ICD-10-CM | POA: Diagnosis not present

## 2014-06-26 NOTE — Telephone Encounter (Signed)
Received confirmation of the patients appt with Dr Arnoldo Morale Neurosurgeon for this am at 8:30am. Did not see this message until this am to cancel the referral to Neurosurgery by Dr Silvio Pate.

## 2014-06-26 NOTE — Telephone Encounter (Signed)
Oh well We can see what he says

## 2014-07-01 ENCOUNTER — Ambulatory Visit (INDEPENDENT_AMBULATORY_CARE_PROVIDER_SITE_OTHER): Payer: Medicare Other | Admitting: Family Medicine

## 2014-07-01 ENCOUNTER — Encounter: Payer: Self-pay | Admitting: Family Medicine

## 2014-07-01 VITALS — BP 114/62 | HR 43 | Temp 97.4°F | Wt 130.5 lb

## 2014-07-01 DIAGNOSIS — R42 Dizziness and giddiness: Secondary | ICD-10-CM

## 2014-07-01 NOTE — Progress Notes (Signed)
Pre visit review using our clinic review tool, if applicable. No additional management support is needed unless otherwise documented below in the visit note. 

## 2014-07-01 NOTE — Assessment & Plan Note (Addendum)
New- intermittent.  Bradycardic today which seems to be a new finding.  Normotensive and in range with what her readings have been- chart reviewed.  Exam otherwise reassuring as well.  ?if perhaps she took full atenolol tablet rather than half but she does not think she did that.  Advised to hold her atenolol tomorrow and follow up with her PCP on Thursday.  If symptoms worsen or change before that, go to ER (if after hours) or call us here. Perhaps check labs (?TSH) if symptoms persist. The patient indicates understanding of these issues and agrees with the plan.

## 2014-07-01 NOTE — Progress Notes (Signed)
Subjective:   Patient ID: Lindsey Ward, female    DOB: [birth year], 64 y.o.   MRN: [id number]  Lindsey Ward is a pleasant 64 y.o. year old female pt of Dr. Silvio Pate, new to me, who presents to clinic today with Dizziness  on 07/01/2014  HPI:  Says she has been feeling "off" for a while.  Just saw her PCP last week.  Does not remember feeling bad when she saw him. Multiple intermittent symptoms- tingling in her feet (bilateral), fatigue.  This morning she was dizzy- [facility] and BP was "100 over something" so she came straight here for a walk in appointment.  Not currently dizzy.  No CP or SOB.  She is taking a steroid for her back.  Takes atenolol for migraine prophylaxis.  She usually takes a half tablet although full tablet listed on med list.  Does not thinks she took a full tablet today.  Lab Results  Component Value Date   TSH 2.26 10/10/2012   Lab Results  Component Value Date   WBC 8.1 10/21/2013   HGB 12.7 10/21/2013   HCT 37.5 10/21/2013   MCV 92.7 10/21/2013   PLT 289.0 10/21/2013   Lab Results  Component Value Date   CREATININE 0.8 10/21/2013    Current Outpatient Prescriptions on File Prior to Visit  Medication Sig Dispense Refill  . ALPRAZolam (XANAX) 1 MG tablet TAKE 1 TABLET BY MOUTH 3 TIMES A DAY 120 tablet 0  . aspirin EC 81 MG tablet Take by mouth.    Marland Kitchen atenolol (TENORMIN) 25 MG tablet TAKE 1 TABLET BY MOUTH EVERY DAY 30 tablet 2  . cholecalciferol (VITAMIN D) 1000 UNITS tablet Take 1,000 Units by mouth daily.    Marland Kitchen gabapentin (NEURONTIN) 600 MG tablet TAKE 4 TABLETS BY MOUTH AT BEDTIME 360 tablet 3  . HYDROcodone-acetaminophen (NORCO/VICODIN) 5-325 MG per tablet Take 1 tablet by mouth 3 (three) times daily as needed. 90 tablet 0  . ibuprofen (ADVIL,MOTRIN) 200 MG tablet Take 400 mg by mouth every 6 (six) hours as needed.    Marland Kitchen omeprazole (PRILOSEC) 20 MG capsule TAKE 1 CAPSULE BY MOUTH TWICE A DAY 180 capsule 2   No current facility-administered  medications on file prior to visit.    Allergies  Allergen Reactions  . Aspirin     REACTION: GI bleed  . Buspirone Hcl     REACTION: unspecified  . Ciprofloxacin     REACTION: unspecified  . Codeine   . Diazepam   . Duloxetine     REACTION: sz like activity  . Ibuprofen     REACTION: GI bleed  . Oxycodone-Acetaminophen     REACTION: unspecified  . Pregabalin     REACTION: kept her up and confusion  . Propoxyphene Hcl   . Risperidone     REACTION: confusion  . Sulfonamide Derivatives     REACTION: unspecified  . Tramadol Hcl     REACTION: doesn't remember what happened but couldn't tolerate  . Venlafaxine     REACTION: unspecified    Past Medical History  Diagnosis Date  . Chronic fatigue   . Obstipation   . Depression   . Hyperlipemia   . GERD (gastroesophageal reflux disease)   . PUD (peptic ulcer disease)   . Migraine   . Osteopenia     Past Surgical History  Procedure Laterality Date  . Appendectomy  2005  . Cholecystectomy  2004  . Total abdominal hysterectomy w/ bilateral salpingoophorectomy  1986    Family History  Problem Relation Age of Onset  . Heart disease Mother     AMI  . Stroke Mother   . Heart disease Father   . Hypertension Sister   . Hypertension Sister   . Heart disease Sister     MI  . Diabetes Sister     History   Social History  . Marital Status: Married    Spouse Name: N/A  . Number of Children: 1  . Years of Education: N/A   Occupational History  . DISABLED    Social History Main Topics  . Smoking status: Former Smoker -- 1.00 packs/day for 40 years    Types: Cigarettes    Quit date: 09/25/2013  . Smokeless tobacco: Never Used     Comment:    . Alcohol Use: No  . Drug Use: No  . Sexual Activity: Not on file   Other Topics Concern  . Not on file   Social History Narrative   Living alone again      No living will   Daughter should make health care decisions   Would accept resuscitation but no prolonged  artificial ventilation.   No tube feeds if cognitively unaware   The PMH, PSH, Social History, Family History, Medications, and allergies have been reviewed in [facility], and have been updated if relevant.   Review of Systems  Constitutional: Positive for fatigue.  HENT: Negative.   Respiratory: Negative.   Cardiovascular: Negative.   Gastrointestinal: Negative.   Endocrine: Negative.   Genitourinary: Negative.   Musculoskeletal: Positive for myalgias, back pain and arthralgias.  Skin: Negative.   Neurological: Positive for dizziness. Negative for tremors, seizures, syncope, facial asymmetry, speech difficulty, weakness, light-headedness, numbness and headaches.  Hematological: Negative.   All other systems reviewed and are negative.      Objective:    BP 114/62 mmHg  Pulse 43  Temp(Src) 97.4 F (36.3 C) (Oral)  Wt 130 lb 8 oz (59.194 kg)  SpO2 98%  BP Readings from Last 3 Encounters:  07/01/14 114/62  06/24/14 140/80  05/29/14 118/60    Physical Exam  Constitutional: She is oriented to person, place, and time. She appears well-developed and well-nourished. No distress.  HENT:  Head: Normocephalic.  No nystagmus with changes in head position  Eyes: Conjunctivae are normal.  Neck: Normal range of motion.  Cardiovascular: Regular rhythm and normal heart sounds.  Bradycardia present.   Pulmonary/Chest: Effort normal and breath sounds normal.  Musculoskeletal: She exhibits no edema.  Neurological: She is alert and oriented to person, place, and time. No cranial nerve deficit.  Skin: Skin is warm and dry.  Psychiatric: She has a normal mood and affect. Her behavior is normal. Judgment and thought content normal.          Assessment & Plan:   Dizziness and giddiness No Follow-up on file.

## 2014-07-01 NOTE — Patient Instructions (Signed)
Nice to meet you. Please do not take your atenolol tomorrow.  Come see Dr. Silvio Pate on Thursday so he can recheck your blood pressure and pulse.

## 2014-07-03 ENCOUNTER — Encounter: Payer: Self-pay | Admitting: Internal Medicine

## 2014-07-03 ENCOUNTER — Ambulatory Visit (INDEPENDENT_AMBULATORY_CARE_PROVIDER_SITE_OTHER): Payer: Medicare Other | Admitting: Internal Medicine

## 2014-07-03 VITALS — BP 110/60 | HR 64 | Temp 97.9°F | Wt 132.0 lb

## 2014-07-03 DIAGNOSIS — M545 Low back pain, unspecified: Secondary | ICD-10-CM

## 2014-07-03 DIAGNOSIS — R001 Bradycardia, unspecified: Secondary | ICD-10-CM | POA: Diagnosis not present

## 2014-07-03 NOTE — Progress Notes (Signed)
Pre visit review using our clinic review tool, if applicable. No additional management support is needed unless otherwise documented below in the visit note. 

## 2014-07-03 NOTE — Assessment & Plan Note (Signed)
Probable reason for her dizziness Still low normal  Will stay off the atenolol

## 2014-07-03 NOTE — Progress Notes (Signed)
Subjective:    Patient ID: EWA HIPP, female    DOB: [birth year], 64 y.o.   MRN: [id number]  HPI Here for follow up of the dizziness Hasn't been taking the atenolol since her last visit Still has some balance problems--thinks she can't walk a straight line  Still with some headache 3 days in a row now Old fioricet not helpful  Back is some better Only intermittent pain Has given up mowing  Current Outpatient Prescriptions on File Prior to Visit  Medication Sig Dispense Refill  . ALPRAZolam (XANAX) 1 MG tablet TAKE 1 TABLET BY MOUTH 3 TIMES A DAY 120 tablet 0  . atenolol (TENORMIN) 25 MG tablet TAKE 1 TABLET BY MOUTH EVERY DAY 30 tablet 2  . cholecalciferol (VITAMIN D) 1000 UNITS tablet Take 1,000 Units by mouth daily.    Marland Kitchen gabapentin (NEURONTIN) 600 MG tablet TAKE 4 TABLETS BY MOUTH AT BEDTIME 360 tablet 3  . HYDROcodone-acetaminophen (NORCO/VICODIN) 5-325 MG per tablet Take 1 tablet by mouth 3 (three) times daily as needed. 90 tablet 0  . ibuprofen (ADVIL,MOTRIN) 200 MG tablet Take 400 mg by mouth every 6 (six) hours as needed.    Marland Kitchen omeprazole (PRILOSEC) 20 MG capsule TAKE 1 CAPSULE BY MOUTH TWICE A DAY 180 capsule 2   No current facility-administered medications on file prior to visit.    Allergies  Allergen Reactions  . Aspirin     REACTION: GI bleed  . Buspirone Hcl     REACTION: unspecified  . Ciprofloxacin     REACTION: unspecified  . Codeine   . Diazepam   . Duloxetine     REACTION: sz like activity  . Ibuprofen     REACTION: GI bleed  . Oxycodone-Acetaminophen     REACTION: unspecified  . Pregabalin     REACTION: kept her up and confusion  . Propoxyphene Hcl   . Risperidone     REACTION: confusion  . Sulfonamide Derivatives     REACTION: unspecified  . Tramadol Hcl     REACTION: doesn't remember what happened but couldn't tolerate  . Venlafaxine     REACTION: unspecified    Past Medical History  Diagnosis Date  . Chronic fatigue   .  Obstipation   . Depression   . Hyperlipemia   . GERD (gastroesophageal reflux disease)   . PUD (peptic ulcer disease)   . Migraine   . Osteopenia     Past Surgical History  Procedure Laterality Date  . Appendectomy  2005  . Cholecystectomy  2004  . Total abdominal hysterectomy w/ bilateral salpingoophorectomy  1986    Family History  Problem Relation Age of Onset  . Heart disease Mother     AMI  . Stroke Mother   . Heart disease Father   . Hypertension Sister   . Hypertension Sister   . Heart disease Sister     MI  . Diabetes Sister     History   Social History  . Marital Status: Married    Spouse Name: N/A  . Number of Children: 1  . Years of Education: N/A   Occupational History  . DISABLED    Social History Main Topics  . Smoking status: Former Smoker -- 1.00 packs/day for 40 years    Types: Cigarettes    Quit date: 09/25/2013  . Smokeless tobacco: Never Used     Comment:    . Alcohol Use: No  . Drug Use: No  . Sexual Activity: Not  on file   Other Topics Concern  . Not on file   Social History Narrative   Living alone again      No living will   Daughter should make health care decisions   Would accept resuscitation but no prolonged artificial ventilation.   No tube feeds if cognitively unaware   Review of Systems Still with bowel incontinence--leaks at times. Takes magnesium for constipation and it helps but then she still leaks on other days. Wears protection. Goes 4 times per day. Discussed stopping magnesium Had some numbness on left scalp briefly 2 days ago    Objective:   Physical Exam  Neck: Normal range of motion. Neck supple.  Cardiovascular: Normal rate and regular rhythm.   HR 60  Musculoskeletal: She exhibits no edema or tenderness.  Neurological:  Normal gait          Assessment & Plan:

## 2014-07-03 NOTE — Patient Instructions (Addendum)
Please stop the magnesium. Try a fiber supplement instead like citrucel, metamucil or fibercon. Please stop the atenolol for good.

## 2014-07-03 NOTE — Assessment & Plan Note (Signed)
Better Doesn't need more predpak I don't recommend baclofen now--- may give her the "drunk" feeling

## 2014-07-09 ENCOUNTER — Encounter: Payer: Self-pay | Admitting: Internal Medicine

## 2014-07-16 ENCOUNTER — Telehealth: Payer: Self-pay | Admitting: Internal Medicine

## 2014-07-16 NOTE — Telephone Encounter (Signed)
[facility] Patient Name: Lindsey Ward DOB: 08-16-1950 Initial Comment Caller states having bad pain in neck left arm and legs Nurse Assessment Nurse: Martyn Ehrich, RN, Felicia Date/Time (Eastern Time): 07/16/2014 4:38:36 PM Confirm and document reason for call. If symptomatic, describe symptoms. ---PT has pain in neck and L arm and L leg without injury onset today (neck to knee cap). All hurt about the same (level 10). She can walk slower than usual. Slower only bc of pain - no weakness on one side of body Has the patient traveled out of the country within the last 30 days? ---No Does the patient require triage? ---YesRelated visit to physician within the last 2 weeks? ---No Does the PT have any chronic conditions? (i.e. diabetes, asthma, etc.) ---Yes List chronic conditions. ---fibromyalgia Guidelines Guideline Title Affirmed Question Affirmed Notes Neck Pain or Stiffness Patient sounds very sick or weak to the triager Final Disposition User Go to ED Now (or PCP triage) Martyn Ehrich, RN, Felicia Comments She Disagrees with ER disposition - says she has had pain like this before in the past and ER did not help her - no fever. Pt denies one sided numbness or weakness. She can stand. hydrocodone and motrin was just now taken for 1st time today - swelling in neck has been going on for 1 month ([place] has not seen her neck) - it is moderately swollen and she can eat though. So normally she takes her pain medication earlier in the day and she wonders if it would have been a normal day pain wise if she had tried to reach back line 2-3 times and was on hold - then the office was closed.Call Id: [id number]

## 2014-07-17 NOTE — Telephone Encounter (Signed)
Spoke with patient and she feels better today, pt denied appt.

## 2014-07-17 NOTE — Telephone Encounter (Signed)
Please check on her This sounds like what she had been complaining about last month also. Set up appt if she needs it

## 2014-07-17 NOTE — Telephone Encounter (Signed)
PLEASE NOTE: All timestamps contained within this report are represented as [place] Standard Time. CONFIDENTIALTY NOTICE: This fax transmission is intended only for the addressee. It contains information that is legally privileged, confidential or otherwise protected from use or disclosure. If you are not the intended recipient, you are strictly prohibited from reviewing, disclosing, copying using or disseminating any of this information or taking any action in reliance on or regarding this information. If you have received this fax in error, please notify us immediately by telephone so that we can arrange for its return to [place]. Phone: [phone], Toll-Free: [phone], Fax: [phone] Page: 1 of 2 Call Id: [id number] [place] Patient Name: Lindsey Ward Gender: Female DOB: [birth year] Age: 64 Y 10 M 19 D Return Phone Number: [phone] (Primary), [phone] (Secondary) Address[place] [address] Client [facility] - Client Client Site [place] - Day Physician Viviana Simpler Contact Type Call Call Type Triage / Clinical Relationship To Patient Self Appointment Disposition EMR Appointment Not Necessary Info pasted into Epic Yes Return Phone Number [phone] (Primary) Chief Complaint Pain - Generalized Initial Comment Caller states having bad pain in neck left arm and legs PreDisposition Call Doctor Nurse Assessment Nurse: Martyn Ehrich, RN, Solmon Ice Date/Time (Eastern Time): 07/16/2014 4:38:36 PM Confirm and document reason for call. If symptomatic, describe symptoms. ---PT has pain in neck and L arm and L leg without injury onset today (neck to knee cap). All hurt about the same (level 10). She can walk slower than usual. Slower only bc of pain - no weakness on one side of body Has the patient traveled out of the country within  the last 30 days? ---No Does the patient require triage? ---Yes Related visit to physician within the last 2 weeks? ---No Does the PT have any chronic conditions? (i.e. diabetes, asthma, etc.) ---Yes List chronic conditions. ---fibromyalgia Guidelines Guideline Title Affirmed Question Affirmed Notes Nurse Date/Time [name] Time) Neck Pain or Stiffness Patient sounds very sick or weak to the triager Martyn Ehrich, RN, [facility] 3/0/1601 0:93:23 PM Disp. Time [name] Time) Disposition Final User 07/16/2014 5:01:37 PM Call Completed Martyn Ehrich RN, Solmon Ice 07/16/7320 0:25:42 PM Send To RN Personal Martyn Ehrich, RN, Felicia 7/0/6237 6:28:31 PM Call Completed Martyn Ehrich, RN, Felicia 07/13/7614 0:73:71 PM Go to ED Now (or PCP triage) Yes Martyn Ehrich, RN, Felicia PLEASE NOTE: All timestamps contained within this report are represented as [place] Standard Time. CONFIDENTIALTY NOTICE: This fax transmission is intended only for the addressee. It contains information that is legally privileged, confidential or otherwise protected from use or disclosure. If you are not the intended recipient, you are strictly prohibited from reviewing, disclosing, copying using or disseminating any of this information or taking any action in reliance on or regarding this information. If you have received this fax in error, please notify us immediately by telephone so that we can arrange for its return to [place]. Phone: [phone], Toll-Free: [phone], Fax: [phone] Page: 2 of 2 Call Id: [id number] Caller Understands: Yes Disagree/Comply: Disagree Disagree/Comply Reason: Disagree with instructions Care Advice Given Per Guideline GO TO ED NOW (OR PCP TRIAGE): DRIVING: Another adult should drive. After Care Instructions Given Call Event Type User Date / Time Description Comments User: Daphene Calamity, RN Date/Time [name] Time): 07/16/2014 4:47:05 PM She Disagrees with ER disposition - says she has had pain like this before in the past and ER did  not help her - no  fever. User: Daphene Calamity, RN Date/Time [name] Time): 07/16/2014 4:48:29 PM Pt denies one sided numbness or weakness. She can stand. User: Daphene Calamity, RN Date/Time [name] Time): 07/16/2014 4:58:55 PM hydrocodone and motrin was just now taken for 1st time today - swelling in neck has been going on for 1 month ([place] has not seen her neck) - it is moderately swollen and she can eat though. So normally she takes her pain medication earlier in the day and she wonders if it would have been a normal day pain wise if she had User: Daphene Calamity, RN Date/Time (Eastern Time): 07/16/2014 5:01:19 PM tried to reach back line 2-3 times and was on hold - then the office was closed.

## 2014-07-28 ENCOUNTER — Other Ambulatory Visit: Payer: Self-pay | Admitting: Internal Medicine

## 2014-07-28 DIAGNOSIS — J387 Other diseases of larynx: Secondary | ICD-10-CM | POA: Diagnosis not present

## 2014-07-28 DIAGNOSIS — R1310 Dysphagia, unspecified: Secondary | ICD-10-CM | POA: Diagnosis not present

## 2014-07-28 DIAGNOSIS — Z87891 Personal history of nicotine dependence: Secondary | ICD-10-CM | POA: Diagnosis not present

## 2014-07-28 MED ORDER — HYDROCODONE-ACETAMINOPHEN 5-325 MG PO TABS: 1.0000 | ORAL_TABLET | Freq: Three times a day (TID) | ORAL | Status: DC | PRN

## 2014-07-28 NOTE — Telephone Encounter (Signed)
Patient left a voicemail requesting a refill on Hydrocodone. Last refill 05/29/14 #90. Call when ready for pickup.

## 2014-07-28 NOTE — Telephone Encounter (Signed)
Spoke with patient and advised rx ready for pick-up and it will be at the front desk.  

## 2014-07-28 NOTE — Telephone Encounter (Signed)
06/10/14 

## 2014-07-28 NOTE — Telephone Encounter (Signed)
rx called into pharmacy

## 2014-07-28 NOTE — Telephone Encounter (Signed)
Approved: #[id number] x 0 

## 2014-07-31 ENCOUNTER — Telehealth: Payer: Self-pay

## 2014-07-31 NOTE — Telephone Encounter (Signed)
Pt left v/m; pts migraines have come back since stopping BP med in 06/2014; pt wants to know if should restart atenolol or what to take for the migraine h/a; [facility]. Pt last seen 07/03/14.Please advise.

## 2014-07-31 NOTE — Telephone Encounter (Signed)
I don't think she should take the atenolol again since it slowed her heart too much. There are some other options but I am concerned with all her trouble with different medications (and one choice is similar to gabapentin and not sure she should be on both) It is probably safest to take some aleve when her headaches come on but we probably would need a visit to discuss alternative choices for daily preventative

## 2014-07-31 NOTE — Telephone Encounter (Signed)
Spoke with patient and advised results, she will call for an appointment

## 2014-08-05 ENCOUNTER — Encounter: Payer: Self-pay | Admitting: Internal Medicine

## 2014-08-05 ENCOUNTER — Ambulatory Visit (INDEPENDENT_AMBULATORY_CARE_PROVIDER_SITE_OTHER): Payer: Medicare Other | Admitting: Internal Medicine

## 2014-08-05 VITALS — BP 120/70 | HR 66 | Temp 98.1°F | Wt 138.0 lb

## 2014-08-05 DIAGNOSIS — M797 Fibromyalgia: Secondary | ICD-10-CM

## 2014-08-05 DIAGNOSIS — G43019 Migraine without aura, intractable, without status migrainosus: Secondary | ICD-10-CM

## 2014-08-05 MED ORDER — IMIPRAMINE HCL 10 MG PO TABS: 10.0000 mg | ORAL_TABLET | Freq: Every day | ORAL | Status: DC

## 2014-08-05 NOTE — Progress Notes (Signed)
Pre visit review using our clinic review tool, if applicable. No additional management support is needed unless otherwise documented below in the visit note. 

## 2014-08-05 NOTE — Patient Instructions (Signed)
Please try the imipramine 1 at bedtime for 4-5 days and then increase to 2 at bedtime. Ask the pharmacist about the cash price if the insurance doesn't cover

## 2014-08-05 NOTE — Progress Notes (Signed)
Subjective:    Patient ID: Lindsey Ward, female    DOB: [birth year], 64 y.o.   MRN: [id number]  HPI Here to discuss migraine prevention options  Had to go off the atenolol due to bradycardia Occasionally gets a palpitation Very tired due to the headaches--affecting her sleep Not much dizziness now  Has tried aleve --not helpful (and she was worried about her history of ulcer)  Back a while ago--went to dentist Sent to Dr Tami Ribas due to throat symptoms Found nasal congestion but throat okay--told to use nasal spray Gets swelling around neck (in large circle)  Ongoing fibromyalgia pain Body pain-- arms, legs, etc  Current Outpatient Prescriptions on File Prior to Visit  Medication Sig Dispense Refill  . ALPRAZolam (XANAX) 1 MG tablet TAKE 1 TABLET BY MOUTH 3 TIMES A DAY 120 tablet 0  . cholecalciferol (VITAMIN D) 1000 UNITS tablet Take 1,000 Units by mouth daily.    Marland Kitchen gabapentin (NEURONTIN) 600 MG tablet TAKE 4 TABLETS BY MOUTH AT BEDTIME 360 tablet 3  . HYDROcodone-acetaminophen (NORCO/VICODIN) 5-325 MG per tablet Take 1 tablet by mouth 3 (three) times daily as needed. 90 tablet 0  . ibuprofen (ADVIL,MOTRIN) 200 MG tablet Take 400 mg by mouth every 6 (six) hours as needed.    Marland Kitchen omeprazole (PRILOSEC) 20 MG capsule TAKE 1 CAPSULE BY MOUTH TWICE A DAY 180 capsule 2   No current facility-administered medications on file prior to visit.    Allergies  Allergen Reactions  . Aspirin     REACTION: GI bleed  . Buspirone Hcl     REACTION: unspecified  . Ciprofloxacin     REACTION: unspecified  . Codeine   . Diazepam   . Duloxetine     REACTION: sz like activity  . Ibuprofen     REACTION: GI bleed  . Oxycodone-Acetaminophen     REACTION: unspecified  . Pregabalin     REACTION: kept her up and confusion  . Propoxyphene Hcl   . Risperidone     REACTION: confusion  . Sulfonamide Derivatives     REACTION: unspecified  . Tramadol Hcl     REACTION: doesn't remember what  happened but couldn't tolerate  . Venlafaxine     REACTION: unspecified    Past Medical History  Diagnosis Date  . Chronic fatigue   . Obstipation   . Depression   . Hyperlipemia   . GERD (gastroesophageal reflux disease)   . PUD (peptic ulcer disease)   . Migraine   . Osteopenia     Past Surgical History  Procedure Laterality Date  . Appendectomy  2005  . Cholecystectomy  2004  . Total abdominal hysterectomy w/ bilateral salpingoophorectomy  1986    Family History  Problem Relation Age of Onset  . Heart disease Mother     AMI  . Stroke Mother   . Heart disease Father   . Hypertension Sister   . Hypertension Sister   . Heart disease Sister     MI  . Diabetes Sister     History   Social History  . Marital Status: Married    Spouse Name: N/A  . Number of Children: 1  . Years of Education: N/A   Occupational History  . DISABLED    Social History Main Topics  . Smoking status: Former Smoker -- 1.00 packs/day for 40 years    Types: Cigarettes    Quit date: 09/25/2013  . Smokeless tobacco: Never Used     Comment:    .  Alcohol Use: No  . Drug Use: No  . Sexual Activity: Not on file   Other Topics Concern  . Not on file   Social History Narrative   Living alone again      No living will   Daughter should make health care decisions   Would accept resuscitation but no prolonged artificial ventilation.   No tube feeds if cognitively unaware   Review of Systems Had fecal incontinence regularly--just came on Appetite is okay Weight going up    Objective:   Physical Exam  Constitutional: She appears well-developed. No distress.  Neck: Normal range of motion. No thyromegaly present.  Cardiovascular: Normal rate, regular rhythm and normal heart sounds.   No murmur heard. Pulmonary/Chest: Effort normal and breath sounds normal. No respiratory distress. She has no wheezes. She has no rales.  Lymphadenopathy:    She has no cervical adenopathy.    Psychiatric:  Anxious as usual          Assessment & Plan:

## 2014-08-05 NOTE — Assessment & Plan Note (Signed)
Failed multiple meds Discussed regular walking ?imipramine might help

## 2014-08-05 NOTE — Assessment & Plan Note (Signed)
More frequent off the atenolol--but had bradycardia and dizziness Will try imipramine in low dose

## 2014-08-14 ENCOUNTER — Ambulatory Visit: Payer: Medicare Other | Admitting: Internal Medicine

## 2014-08-15 ENCOUNTER — Ambulatory Visit (INDEPENDENT_AMBULATORY_CARE_PROVIDER_SITE_OTHER): Payer: Medicare Other | Admitting: Internal Medicine

## 2014-08-15 ENCOUNTER — Encounter: Payer: Self-pay | Admitting: Internal Medicine

## 2014-08-15 VITALS — BP 110/70 | HR 57 | Temp 98.1°F | Wt 136.0 lb

## 2014-08-15 DIAGNOSIS — G43009 Migraine without aura, not intractable, without status migrainosus: Secondary | ICD-10-CM | POA: Diagnosis not present

## 2014-08-15 MED ORDER — IMIPRAMINE HCL 10 MG PO TABS: 30.0000 mg | ORAL_TABLET | Freq: Every day | ORAL | Status: DC

## 2014-08-15 NOTE — Patient Instructions (Signed)
Please restart the imipramine at 3 tabs at bedtime (30mg ). Take this for a week. If you are still getting headaches but no problems with the medicine, increase it to 40mg  at bedtime (4 tabs). If your symptoms continue (mostly the headaches but it may also help your aching and sleep problems) after 2 more weeks (so a month from now), you can even try 5 tabs at bedtime.

## 2014-08-15 NOTE — Assessment & Plan Note (Signed)
Tolerated imipramine but not much help at 20mg  Will titrate up May help other symptoms as well Will recheck at her August visit

## 2014-08-15 NOTE — Progress Notes (Signed)
Pre visit review using our clinic review tool, if applicable. No additional management support is needed unless otherwise documented below in the visit note. 

## 2014-08-15 NOTE — Progress Notes (Signed)
Subjective:    Patient ID: Lindsey Ward, female    DOB: 1950/11/20, 64 y.o.   MRN: [id number]  HPI Here for follow up of migraines, fibromyalgia and sleep problems  Went up to 2 of the imipramine Helped at first, then stopped So then she stopped it  Notes swelling and bloating Neck still hurting  Still with migraines Has had 3 weekly she thinks  Current Outpatient Prescriptions on File Prior to Visit  Medication Sig Dispense Refill  . ALPRAZolam (XANAX) 1 MG tablet TAKE 1 TABLET BY MOUTH 3 TIMES A DAY 120 tablet 0  . cholecalciferol (VITAMIN D) 1000 UNITS tablet Take 1,000 Units by mouth daily.    Marland Kitchen gabapentin (NEURONTIN) 600 MG tablet TAKE 4 TABLETS BY MOUTH AT BEDTIME 360 tablet 3  . HYDROcodone-acetaminophen (NORCO/VICODIN) 5-325 MG per tablet Take 1 tablet by mouth 3 (three) times daily as needed. 90 tablet 0  . ibuprofen (ADVIL,MOTRIN) 200 MG tablet Take 400 mg by mouth every 6 (six) hours as needed.    Marland Kitchen omeprazole (PRILOSEC) 20 MG capsule TAKE 1 CAPSULE BY MOUTH TWICE A DAY 180 capsule 2   No current facility-administered medications on file prior to visit.    Allergies  Allergen Reactions  . Aspirin     REACTION: GI bleed  . Buspirone Hcl     REACTION: unspecified  . Ciprofloxacin     REACTION: unspecified  . Codeine   . Diazepam   . Duloxetine     REACTION: sz like activity  . Ibuprofen     REACTION: GI bleed  . Oxycodone-Acetaminophen     REACTION: unspecified  . Pregabalin     REACTION: kept her up and confusion  . Propoxyphene Hcl   . Risperidone     REACTION: confusion  . Sulfonamide Derivatives     REACTION: unspecified  . Tramadol Hcl     REACTION: doesn't remember what happened but couldn't tolerate  . Venlafaxine     REACTION: unspecified    Past Medical History  Diagnosis Date  . Chronic fatigue   . Obstipation   . Depression   . Hyperlipemia   . GERD (gastroesophageal reflux disease)   . PUD (peptic ulcer disease)   . Migraine     . Osteopenia     Past Surgical History  Procedure Laterality Date  . Appendectomy  2005  . Cholecystectomy  2004  . Total abdominal hysterectomy w/ bilateral salpingoophorectomy  1986    Family History  Problem Relation Age of Onset  . Heart disease Mother     AMI  . Stroke Mother   . Heart disease Father   . Hypertension Sister   . Hypertension Sister   . Heart disease Sister     MI  . Diabetes Sister     History   Social History  . Marital Status: Married    Spouse Name: N/A  . Number of Children: 1  . Years of Education: N/A   Occupational History  . DISABLED    Social History Main Topics  . Smoking status: Former Smoker -- 1.00 packs/day for 40 years    Types: Cigarettes    Quit date: 09/25/2013  . Smokeless tobacco: Never Used     Comment:    . Alcohol Use: No  . Drug Use: No  . Sexual Activity: Not on file   Other Topics Concern  . Not on file   Social History Narrative   Living alone again  No living will   Daughter should make health care decisions   Would accept resuscitation but no prolonged artificial ventilation.   No tube feeds if cognitively unaware   Review of Systems Appetite isn't great Weight back down 2# though Feels she is retaining fluid No constipation on the imipramine--still will leak so needs pad Using probiotic now to see if it helps her stomach "something going on with left shoulder"    Objective:   Physical Exam  Constitutional: She appears well-developed and well-nourished. No distress.  Neck:  Tight along traps and cervical paraspinals Pain with passive ROM but fairly normal          Assessment & Plan:

## 2014-08-26 ENCOUNTER — Telehealth: Payer: Self-pay | Admitting: Internal Medicine

## 2014-08-26 NOTE — Telephone Encounter (Signed)
Pt has appt on 08/27/14 at 12:30 pm with Dr Silvio Pate.

## 2014-08-26 NOTE — Telephone Encounter (Signed)
[facility] Patient Name: MAURICA OMURA DOB: [birth year] Initial Comment Caller states she is breaking out from [employer] Nurse Assessment Nurse: Erlene Quan, RN, Manuela Schwartz Date/Time [name] Time): 08/26/2014 11:35:23 AM Confirm and document reason for call. If symptomatic, describe symptoms. ---Caller states she is breaking out from medication - states she has flat red spots about the size of a misquote bites - one of each hip and 3 on her breast - she is on the medication the help relax her neck she has possible bulging disk in neck so its so sore and stiff she can not move it much - states the medication is not helping and this is her 4th week - she was supposed to call and let Dr Silvio Pate know if it didn't help so they can discuss what is next for her , so she needs that follow up appt Has the patient traveled out of the country within the last 30 days? ---Not Applicable Does the patient require triage? ---Yes Related visit to physician within the last 2 weeks? ---Yes Does the PT have any chronic conditions? (i.e. diabetes, asthma, etc.) ---Yes List chronic conditions. ---see EMR Guidelines Guideline Title Affirmed Question Affirmed Notes Insect Bite All other insect bites (all triage questions negative) Final Disposition User [place], RN, Manuela Schwartz Comments appointment tomorrow August 27, 2014 Dr Silvio Pate 1230PM

## 2014-08-26 NOTE — Telephone Encounter (Signed)
Will evaluate her at [facility] tomorrow

## 2014-08-26 NOTE — Telephone Encounter (Signed)
PLEASE NOTE: All timestamps contained within this report are represented as [place] Standard Time. CONFIDENTIALTY NOTICE: This fax transmission is intended only for the addressee. It contains information that is legally privileged, confidential or otherwise protected from use or disclosure. If you are not the intended recipient, you are strictly prohibited from reviewing, disclosing, copying using or disseminating any of this information or taking any action in reliance on or regarding this information. If you have received this fax in error, please notify us immediately by telephone so that we can arrange for its return to [place]. Phone: [phone], Toll-Free: [phone], Fax: [phone] Page: 1 of 2 Call Id: [id number] [place] Patient Name: Lindsey Ward Gender: Female DOB: Dec 09, 1950 Age: 64 Y 11 M 30 D Return Phone Number: [phone] (Primary) Address[place] [address] Client [facility] Client Client Site [facility] Day Physician Viviana Simpler Contact Type Call Call Type Triage / Clinical Relationship To Patient Self Appointment Disposition EMR Appointment Scheduled Info pasted into Epic Yes Return Phone Number [phone] (Primary) Chief Complaint Rash - Widespread Initial Comment Caller states she is breaking out from medicaiton PreDisposition Call Doctor Nurse Assessment Nurse: Erlene Quan, RN, Manuela Schwartz Date/Time [name] Time): 08/26/2014 11:35:23 AM Confirm and document reason for call. If symptomatic, describe symptoms. ---Caller states she is breaking out from medication - states she has flat red spots about the size of a misquote bites - one of each hip and 3 on her breast - she is on the medication the help relax her neck she has possible bulging disk in neck so its so sore and stiff she can not move it much -  states the medication is not helping and this is her 4th week - she was supposed to call and let Dr Silvio Pate know if it didn't help so they can discuss what is next for her , so she needs that follow up appt Has the patient traveled out of the country within the last 30 days? ---Not Applicable Does the patient require triage? ---Yes Related visit to physician within the last 2 weeks? ---Yes Does the PT have any chronic conditions? (i.e. diabetes, asthma, etc.) ---Yes List chronic conditions. ---see EMR Guidelines Guideline Title Affirmed Question Affirmed Notes Nurse Date/Time [name] Time) Insect Bite All other insect bites (all triage questions negative) Erlene Quan, RN, Manuela Schwartz 08/26/2014 11:42:41 AM Disp. Time [name] Time) Disposition Final User 08/26/2014 11:43:52 AM [place], RN, Edwena Bunde Understands: Yes PLEASE NOTE: All timestamps contained within this report are represented as [place] Standard Time. CONFIDENTIALTY NOTICE: This fax transmission is intended only for the addressee. It contains information that is legally privileged, confidential or otherwise protected from use or disclosure. If you are not the intended recipient, you are strictly prohibited from reviewing, disclosing, copying using or disseminating any of this information or taking any action in reliance on or regarding this information. If you have received this fax in error, please notify us immediately by telephone so that we can arrange for its return to [place]. Phone: [phone], Toll-Free: [phone], Fax: [phone] Page: 2 of 2 Call Id: [id number] Disagree/Comply: Comply Care Advice Given Per Guideline HOME CARE: You should be able to treat this at home. REASSURANCE: * It sounds like a normal reaction to an insect bite. * While most insect bites are a small red bump, some are larger (like a hive) and some have  a small water blister in the center. * This does not mean you have an allergy or  that the bite is infected. EXPECTED COURSE: * Most insect bites are itchy and puffy for several days. * Insect bites of the upper face can cause marked swelling around the eye, but this is harmless. * Any pinkness or redness usually lasts 3 days. CALL BACK IF: * Severe pain persists over 2 hours after pain medicine * Bite looks infected (pus, red streaks, increased tenderness) * Bite has not healed after 14 days * Redness getting larger and more than 48 hours after the bite * You become worse. CARE ADVICE given per Insect Bite (Adult) guideline. After Care Instructions Given Call Event Type User Date / Time Description Comments User: Clint Guy, RN Date/Time [name] Time): 08/26/2014 11:51:03 AM appointment tomorrow August 27, 2014 Dr Silvio Pate 1230PM

## 2014-08-27 ENCOUNTER — Ambulatory Visit (INDEPENDENT_AMBULATORY_CARE_PROVIDER_SITE_OTHER): Payer: Medicare Other | Admitting: Internal Medicine

## 2014-08-27 ENCOUNTER — Encounter: Payer: Self-pay | Admitting: Internal Medicine

## 2014-08-27 VITALS — BP 110/70 | HR 67 | Temp 98.6°F | Wt 134.0 lb

## 2014-08-27 DIAGNOSIS — M797 Fibromyalgia: Secondary | ICD-10-CM | POA: Diagnosis not present

## 2014-08-27 DIAGNOSIS — R21 Rash and other nonspecific skin eruption: Secondary | ICD-10-CM | POA: Diagnosis not present

## 2014-08-27 MED ORDER — TRIAMCINOLONE ACETONIDE 0.1 % EX CREA: 1.0000 "application " | TOPICAL_CREAM | Freq: Two times a day (BID) | CUTANEOUS | Status: DC | PRN

## 2014-08-27 MED ORDER — IMIPRAMINE HCL 25 MG PO TABS: 50.0000 mg | ORAL_TABLET | Freq: Every day | ORAL | Status: DC

## 2014-08-27 NOTE — Assessment & Plan Note (Signed)
Looks like bites Will give cortisone cream infection

## 2014-08-27 NOTE — Patient Instructions (Signed)
Please increase the imipramine to 50mg  at bedtime. Take 2 of the 25 mg tablets once the 10's run out

## 2014-08-27 NOTE — Assessment & Plan Note (Signed)
Ongoing problems Will try increasing the imipramine to 50mg  nightly Will consider going to 75 Hydrocodone no help She is going to go back to the chiropractor

## 2014-08-27 NOTE — Progress Notes (Signed)
Subjective:    Patient ID: ESMEE FALLAW, female    DOB: March 30, 1950, 64 y.o.   MRN: [id number]  HPI Came back early due to a rash  Noticed a "smooth" rash on left breast and buttocks Last week she noticed these--1st one on butt Itching only when they first appear  Still having the neck and back pain Has increased the imipramine to 40mg   Frequent nocturia persists Has to tip toe at first when getting out of bed--- or feet really hurt (then fell due to the way she was walking)  Current Outpatient Prescriptions on File Prior to Visit  Medication Sig Dispense Refill  . ALPRAZolam (XANAX) 1 MG tablet TAKE 1 TABLET BY MOUTH 3 TIMES A DAY 120 tablet 0  . cholecalciferol (VITAMIN D) 1000 UNITS tablet Take 1,000 Units by mouth daily.    Marland Kitchen gabapentin (NEURONTIN) 600 MG tablet TAKE 4 TABLETS BY MOUTH AT BEDTIME 360 tablet 3  . HYDROcodone-acetaminophen (NORCO/VICODIN) 5-325 MG per tablet Take 1 tablet by mouth 3 (three) times daily as needed. 90 tablet 0  . ibuprofen (ADVIL,MOTRIN) 200 MG tablet Take 400 mg by mouth every 6 (six) hours as needed.    Marland Kitchen imipramine (TOFRANIL) 10 MG tablet Take 3-4 tablets (30-40 mg total) by mouth at bedtime. 120 tablet 3  . omeprazole (PRILOSEC) 20 MG capsule TAKE 1 CAPSULE BY MOUTH TWICE A DAY 180 capsule 2   No current facility-administered medications on file prior to visit.    Allergies  Allergen Reactions  . Aspirin     REACTION: GI bleed  . Buspirone Hcl     REACTION: unspecified  . Ciprofloxacin     REACTION: unspecified  . Codeine   . Diazepam   . Duloxetine     REACTION: sz like activity  . Ibuprofen     REACTION: GI bleed  . Oxycodone-Acetaminophen     REACTION: unspecified  . Pregabalin     REACTION: kept her up and confusion  . Propoxyphene Hcl   . Risperidone     REACTION: confusion  . Sulfonamide Derivatives     REACTION: unspecified  . Tramadol Hcl     REACTION: doesn't remember what happened but couldn't tolerate  .  Venlafaxine     REACTION: unspecified    Past Medical History  Diagnosis Date  . Chronic fatigue   . Obstipation   . Depression   . Hyperlipemia   . GERD (gastroesophageal reflux disease)   . PUD (peptic ulcer disease)   . Migraine   . Osteopenia     Past Surgical History  Procedure Laterality Date  . Appendectomy  2005  . Cholecystectomy  2004  . Total abdominal hysterectomy w/ bilateral salpingoophorectomy  1986    Family History  Problem Relation Age of Onset  . Heart disease Mother     AMI  . Stroke Mother   . Heart disease Father   . Hypertension Sister   . Hypertension Sister   . Heart disease Sister     MI  . Diabetes Sister     History   Social History  . Marital Status: Married    Spouse Name: N/A  . Number of Children: 1  . Years of Education: N/A   Occupational History  . DISABLED    Social History Main Topics  . Smoking status: Former Smoker -- 1.00 packs/day for 40 years    Types: Cigarettes    Quit date: 09/25/2013  . Smokeless tobacco: Never Used  Comment:    . Alcohol Use: No  . Drug Use: No  . Sexual Activity: Not on file   Other Topics Concern  . Not on file   Social History Narrative   Living alone again      No living will   Daughter should make health care decisions   Would accept resuscitation but no prolonged artificial ventilation.   No tube feeds if cognitively unaware   Review of Systems  No fever No N/V     Objective:   Physical Exam  Skin:  3 small red papules on outer left breast and one on upper outer right buttock Not really infected          Assessment & Plan:

## 2014-08-27 NOTE — Progress Notes (Signed)
Pre visit review using our clinic review tool, if applicable. No additional management support is needed unless otherwise documented below in the visit note. 

## 2014-09-07 ENCOUNTER — Other Ambulatory Visit: Payer: Self-pay | Admitting: Internal Medicine

## 2014-09-08 NOTE — Telephone Encounter (Signed)
rx called into pharmacy

## 2014-09-08 NOTE — Telephone Encounter (Signed)
Approved: okay #[id number] x 0 

## 2014-09-08 NOTE — Telephone Encounter (Signed)
07/28/14 

## 2014-09-11 ENCOUNTER — Emergency Department
Admission: EM | Admit: 2014-09-11 | Discharge: 2014-09-11 | Disposition: A | Discharge status: Home / Self Care | Payer: Medicare Other | Attending: Emergency Medicine | Admitting: Emergency Medicine

## 2014-09-11 ENCOUNTER — Telehealth: Payer: Self-pay

## 2014-09-11 DIAGNOSIS — N390 Urinary tract infection, site not specified: Secondary | ICD-10-CM | POA: Diagnosis not present

## 2014-09-11 DIAGNOSIS — Z87891 Personal history of nicotine dependence: Secondary | ICD-10-CM | POA: Insufficient documentation

## 2014-09-11 DIAGNOSIS — Z79899 Other long term (current) drug therapy: Secondary | ICD-10-CM | POA: Diagnosis not present

## 2014-09-11 DIAGNOSIS — R197 Diarrhea, unspecified: Secondary | ICD-10-CM | POA: Diagnosis present

## 2014-09-11 HISTORY — DX: Unspecified intestinal obstruction, unspecified as to partial versus complete obstruction: K56.609

## 2014-09-11 LAB — LIPASE, BLOOD: Lipase: 52 U/L — ABNORMAL HIGH (ref 22–51)

## 2014-09-11 LAB — URINALYSIS COMPLETE WITH MICROSCOPIC (ARMC ONLY)
Bilirubin Urine: NEGATIVE
Glucose, UA: NEGATIVE mg/dL
Ketones, ur: NEGATIVE mg/dL
Nitrite: POSITIVE — AB
Protein, ur: NEGATIVE mg/dL
Specific Gravity, Urine: 1.019 (ref 1.005–1.030)
pH: 5 (ref 5.0–8.0)

## 2014-09-11 LAB — COMPREHENSIVE METABOLIC PANEL
ALT: 21 U/L (ref 14–54)
AST: 37 U/L (ref 15–41)
Albumin: 4.6 g/dL (ref 3.5–5.0)
Alkaline Phosphatase: 97 U/L (ref 38–126)
Anion gap: 6 (ref 5–15)
BUN: 21 mg/dL — ABNORMAL HIGH (ref 6–20)
CO2: 28 mmol/L (ref 22–32)
Calcium: 9.7 mg/dL (ref 8.9–10.3)
Chloride: 106 mmol/L (ref 101–111)
Creatinine, Ser: 0.71 mg/dL (ref 0.44–1.00)
GFR calc Af Amer: >60 mL/min (ref >60)
GFR calc non Af Amer: >60 mL/min (ref >60)
Glucose, Bld: 94 mg/dL (ref 65–99)
Potassium: 3.9 mmol/L (ref 3.5–5.1)
Sodium: 140 mmol/L (ref 135–145)
Total Bilirubin: 0.4 mg/dL (ref 0.3–1.2)
Total Protein: 7.7 g/dL (ref 6.5–8.1)

## 2014-09-11 LAB — CBC WITH DIFFERENTIAL/PLATELET
Basophils Absolute: 0 10*3/uL (ref 0–0.1)
Basophils Relative: 1 %
Eosinophils Absolute: 0.1 10*3/uL (ref 0–0.7)
Eosinophils Relative: 2 %
HCT: 37.3 % (ref 35.0–47.0)
Hemoglobin: 12.6 g/dL (ref 12.0–16.0)
Lymphocytes Relative: 24 %
Lymphs Abs: 1.6 10*3/uL (ref 1.0–3.6)
MCH: 31.1 pg (ref 26.0–34.0)
MCHC: 33.7 g/dL (ref 32.0–36.0)
MCV: 92.3 fL (ref 80.0–100.0)
Monocytes Absolute: 0.5 10*3/uL (ref 0.2–0.9)
Monocytes Relative: 7 %
Neutro Abs: 4.6 10*3/uL (ref 1.4–6.5)
Neutrophils Relative %: 66 %
Platelets: 276 10*3/uL (ref 150–440)
RBC: 4.05 MIL/uL (ref 3.80–5.20)
RDW: 12.6 % (ref 11.5–14.5)
WBC: 6.9 10*3/uL (ref 3.6–11.0)

## 2014-09-11 MED ORDER — CEPHALEXIN 500 MG PO CAPS: 500.0000 mg | ORAL_CAPSULE | Freq: Three times a day (TID) | ORAL | Status: AC

## 2014-09-11 NOTE — ED Notes (Signed)
Pt sent from [facility] with c/o increased lower abd pain for the past week with orange/red jelly loose stools with a hx of abd obstruction, states this feels the same.Marland Kitchen

## 2014-09-11 NOTE — Telephone Encounter (Signed)
Patient stated that she spoke to the triage person at the office and they told her there were no appointments available today.  I have sent her to the [facility] clinic urgent care as she wants to be seen now.  Please follow up with her later today.  Notified Barnett Applebaum, Manufacturing engineer Lead at [facility].

## 2014-09-11 NOTE — Telephone Encounter (Signed)
I don't see record of a call here but it may have happened. This is her style Please check on her on Tuesday

## 2014-09-11 NOTE — Discharge Instructions (Signed)
Please seek medical attention for any high fevers, chest pain, shortness of breath, change in behavior, persistent vomiting, bloody stool or any other new or concerning symptoms. ° °Urinary Tract Infection °Urinary tract infections (UTIs) can develop anywhere along your urinary tract. Your urinary tract is your body's drainage system for removing wastes and extra water. Your urinary tract includes two kidneys, two ureters, a bladder, and a urethra. Your kidneys are a pair of bean-shaped organs. Each kidney is about the size of your fist. They are located below your ribs, one on each side of your spine. °CAUSES °Infections are caused by microbes, which are microscopic organisms, including fungi, viruses, and bacteria. These organisms are so small that they can only be seen through a microscope. Bacteria are the microbes that most commonly cause UTIs. °SYMPTOMS  °Symptoms of UTIs may vary by age and gender of the patient and by the location of the infection. Symptoms in young women typically include a frequent and intense urge to urinate and a painful, burning feeling in the bladder or urethra during urination. Older women and men are more likely to be tired, shaky, and weak and have muscle aches and abdominal pain. A fever may mean the infection is in your kidneys. Other symptoms of a kidney infection include pain in your back or sides below the ribs, nausea, and vomiting. °DIAGNOSIS °To diagnose a UTI, your caregiver will ask you about your symptoms. Your caregiver also will ask to provide a urine sample. The urine sample will be tested for bacteria and white blood cells. White blood cells are made by your body to help fight infection. °TREATMENT  °Typically, UTIs can be treated with medication. Because most UTIs are caused by a bacterial infection, they usually can be treated with the use of antibiotics. The choice of antibiotic and length of treatment depend on your symptoms and the type of bacteria causing your  infection. °HOME CARE INSTRUCTIONS °· If you were prescribed antibiotics, take them exactly as your caregiver instructs you. Finish the medication even if you feel better after you have only taken some of the medication. °· Drink enough water and fluids to keep your urine clear or pale yellow. °· Avoid caffeine, tea, and carbonated beverages. They tend to irritate your bladder. °· Empty your bladder often. Avoid holding urine for long periods of time. °· Empty your bladder before and after sexual intercourse. °· After a bowel movement, women should cleanse from front to back. Use each tissue only once. °SEEK MEDICAL CARE IF:  °· You have back pain. °· You develop a fever. °· Your symptoms do not begin to resolve within 3 days. °SEEK IMMEDIATE MEDICAL CARE IF:  °· You have severe back pain or lower abdominal pain. °· You develop chills. °· You have nausea or vomiting. °· You have continued burning or discomfort with urination. °MAKE SURE YOU:  °· Understand these instructions. °· Will watch your condition. °· Will get help right away if you are not doing well or get worse. °Document Released: 12/08/2004 Document Revised: 08/30/2011 Document Reviewed: 04/08/2011 °Exit[employer]® Patient Information ©2015 [employer]. This information is not intended to replace advice given to you by your health care provider. Make sure you discuss any questions you have with your health care provider. ° °

## 2014-09-11 NOTE — ED Provider Notes (Signed)
[facility] Emergency Department Provider Note   ____________________________________________  Time seen: 1510  I have reviewed the triage vital signs and the nursing notes.   HISTORY  Chief Complaint Abdominal Pain; Nausea; and Diarrhea   History limited by: Not Limited   HPI Lindsey Ward is a 64 y.o. female who presents to the emergency department today from internal clinic because of concerns for diarrhea. Patient states she's been having diarrhea for roughly 6 days. However she has not had any diarrhea today. She stated yesterday when she was wiping she noticed orange mucous-like stool. She has had some mild lower abdominal discomfort with this. She denies any vomiting. She had some some nausea. She denies any fevers although thinks she has had some chills. Denies similar symptoms in the past. States that she does have a history of an obstruction but that presented with constipation. Patient denies any dysuria.   Past Medical History  Diagnosis Date  . Chronic fatigue   . Obstipation   . Depression   . Hyperlipemia   . GERD (gastroesophageal reflux disease)   . PUD (peptic ulcer disease)   . Migraine   . Osteopenia   . Bowel obstruction     Patient Active Problem List   Diagnosis Date Noted  . Rash and nonspecific skin eruption 08/27/2014  . Bradycardia 07/03/2014  . Grief reaction 05/29/2014  . Neck pain 03/04/2014  . Pleuritic chest pain 12/16/2013  . Counseling regarding advanced directives 10/21/2013  . Low back pain 02/11/2013  . Cigarette smoker 09/13/2010  . Routine general medical examination at a health care facility 07/14/2010  . BACK PAIN 05/06/2010  . OSTEOPENIA 03/10/2009  . Episodic mood disorder 08/15/2006  . DEPRESSION 08/15/2006  . GERD 08/15/2006  . Fibromyalgia 08/15/2006  . Sleep disturbance 08/15/2006  . HYPERLIPIDEMIA 12/20/2005  . PANIC ATTACKS 12/20/2005  . Migraine without aura 12/20/2005    Past Surgical  History  Procedure Laterality Date  . Appendectomy  2005  . Cholecystectomy  2004  . Total abdominal hysterectomy w/ bilateral salpingoophorectomy  1986  . Abdominal surgery      Current Outpatient Rx  Name  Route  Sig  Dispense  Refill  . ALPRAZolam (XANAX) 1 MG tablet      TAKE 1 TABLET BY MOUTH 3 TIMES A DAY   120 tablet   0     Not to exceed 5 additional fills before 01/24/2015 ...   . cholecalciferol (VITAMIN D) 1000 UNITS tablet   Oral   Take 1,000 Units by mouth daily.         Marland Kitchen gabapentin (NEURONTIN) 600 MG tablet      TAKE 4 TABLETS BY MOUTH AT BEDTIME   360 tablet   3   . HYDROcodone-acetaminophen (NORCO/VICODIN) 5-325 MG per tablet   Oral   Take 1 tablet by mouth 3 (three) times daily as needed.   90 tablet   0   . ibuprofen (ADVIL,MOTRIN) 200 MG tablet   Oral   Take 400 mg by mouth every 6 (six) hours as needed.         Marland Kitchen omeprazole (PRILOSEC) 20 MG capsule      TAKE 1 CAPSULE BY MOUTH TWICE A DAY   180 capsule   2   . triamcinolone cream (KENALOG) 0.1 %   Topical   Apply 1 application topically 2 (two) times daily as needed.   30 g   0   . imipramine (TOFRANIL) 25 MG tablet  Oral   Take 2-3 tablets (50-75 mg total) by mouth at bedtime.   90 tablet   3     Allergies Aspirin; Buspirone hcl; Ciprofloxacin; Codeine; Diazepam; Duloxetine; Ibuprofen; Oxycodone-acetaminophen; Pregabalin; Propoxyphene hcl; Risperidone; Sulfonamide derivatives; Tramadol hcl; and Venlafaxine  Family History  Problem Relation Age of Onset  . Heart disease Mother     AMI  . Stroke Mother   . Heart disease Father   . Hypertension Sister   . Hypertension Sister   . Heart disease Sister     MI  . Diabetes Sister     Social History History  Substance Use Topics  . Smoking status: Former Smoker -- 1.00 packs/day for 40 years    Types: Cigarettes    Quit date: 09/25/2013  . Smokeless tobacco: Never Used     Comment:    . Alcohol Use: No    Review of  Systems  Constitutional: Negative for fever. Cardiovascular: Negative for chest pain. Respiratory: Negative for shortness of breath. Gastrointestinal: Positive for diarrhea, lower abdominal pain. Negative for vomiting Genitourinary: Negative for dysuria. Musculoskeletal: Negative for back pain. Skin: Negative for rash. Neurological: Negative for headaches, focal weakness or numbness.   10-point ROS otherwise negative.  ____________________________________________   PHYSICAL EXAM:  VITAL SIGNS: ED Triage Vitals  Enc Vitals Group     BP 09/11/14 1417 127/49 mmHg     Pulse Rate 09/11/14 1417 65     Resp --      Temp 09/11/14 1417 97.7 F (36.5 C)     Temp Source 09/11/14 1417 Oral     SpO2 09/11/14 1417 96 %     Weight 09/11/14 1417 126 lb (57.153 kg)     Height 09/11/14 1417 5\' 1"  (1.549 m)     Head Cir --      Peak Flow --      Pain Score 09/11/14 1418 8   Constitutional: Alert and oriented. Well appearing and in no distress. Eyes: Conjunctivae are normal. PERRL. Normal extraocular movements. ENT   Head: Normocephalic and atraumatic.   Nose: No congestion/rhinnorhea.   Mouth/Throat: Mucous membranes are moist.   Neck: No stridor. Hematological/Lymphatic/Immunilogical: No cervical lymphadenopathy. Cardiovascular: Normal rate, regular rhythm.  No murmurs, rubs, or gallops. Respiratory: Normal respiratory effort without tachypnea nor retractions. Breath sounds are clear and equal bilaterally. No wheezes/rales/rhonchi. Gastrointestinal: Soft, no distention. Mild tenderness in the suprapubic region. No rebound. No guarding. No CVA tenderness. Genitourinary: Deferred Musculoskeletal: Normal range of motion in all extremities. No joint effusions.  No lower extremity tenderness nor edema. Neurologic:  Normal speech and language. No gross focal neurologic deficits are appreciated. Speech is normal.  Skin:  Skin is warm, dry and intact. No rash noted. Psychiatric:  Mood and affect are normal. Speech and behavior are normal. Patient exhibits appropriate insight and judgment.  ____________________________________________    LABS (pertinent positives/negatives)  Labs Reviewed  COMPREHENSIVE METABOLIC PANEL - Abnormal; Notable for the following:    BUN 21 (*)    All other components within normal limits  LIPASE, BLOOD - Abnormal; Notable for the following:    Lipase 52 (*)    All other components within normal limits  URINALYSIS COMPLETEWITH MICROSCOPIC ([facility] ONLY) - Abnormal; Notable for the following:    Color, Urine YELLOW (*)    APPearance CLEAR (*)    Hgb urine dipstick 1+ (*)    Nitrite POSITIVE (*)    Leukocytes, UA 2+ (*)    Bacteria, UA MANY (*)  Squamous Epithelial / LPF 0-5 (*)    All other components within normal limits  CBC WITH DIFFERENTIAL/PLATELET     ____________________________________________   EKG  None  ____________________________________________    RADIOLOGY  None  ____________________________________________   PROCEDURES  Procedure(s) performed: None  Critical Care performed: No  ____________________________________________   INITIAL IMPRESSION / ASSESSMENT AND PLAN / ED COURSE  Pertinent labs & imaging results that were available during my care of the patient were reviewed by me and considered in my medical decision making (see chart for details).  Patient presents to the emergency department today with concerns for diarrhea. Patient has not had any diarrhea since yesterday. Blood work is unremarkable. Urine is consistent with a urinary tract infection. Think urinary tract infection could explain patient's symptoms of lower abdominal pain. At this point doubt obstruction given lack of constipation, vomiting or abdominal distention or tympany. Discussed return precautions.  ____________________________________________   FINAL CLINICAL IMPRESSION(S) / ED DIAGNOSES  Final diagnoses:  UTI  (lower urinary tract infection)     Nance Pear, MD 09/11/14 1544

## 2014-09-11 NOTE — Telephone Encounter (Signed)
Patient walked in to [place] office, in hopes of getting an appointment regarding stomach pains and diarrhea.  Our schedule is full today and I explained that to her.  She stated that she has been having diarrhea since the 25th of June and is not sure what to do.  I asked if she is taking anything?  Which is replied no.  After looking at her medication list, no apparent medication changes noted to be causing symptoms.  Was unclear if she calle dyour office prior to coming or not.  Patient has been told that I would send a message to you to advise? Thanks,  Lavella Lemons, [place]

## 2014-09-12 NOTE — Telephone Encounter (Signed)
Spoke with patient and she's doing better, has a UTI and the medication is working per pt.

## 2014-09-19 DIAGNOSIS — D649 Anemia, unspecified: Secondary | ICD-10-CM | POA: Diagnosis not present

## 2014-09-19 DIAGNOSIS — M25473 Effusion, unspecified ankle: Secondary | ICD-10-CM | POA: Diagnosis not present

## 2014-09-21 ENCOUNTER — Ambulatory Visit
Admission: EM | Admit: 2014-09-21 | Discharge: 2014-09-21 | Disposition: A | Discharge status: Home / Self Care | Payer: Medicare Other | Attending: Family Medicine | Admitting: Family Medicine

## 2014-09-21 DIAGNOSIS — M25561 Pain in right knee: Secondary | ICD-10-CM | POA: Diagnosis not present

## 2014-09-21 DIAGNOSIS — M8580 Other specified disorders of bone density and structure, unspecified site: Secondary | ICD-10-CM | POA: Insufficient documentation

## 2014-09-21 DIAGNOSIS — F329 Major depressive disorder, single episode, unspecified: Secondary | ICD-10-CM | POA: Diagnosis not present

## 2014-09-21 DIAGNOSIS — G43909 Migraine, unspecified, not intractable, without status migrainosus: Secondary | ICD-10-CM | POA: Insufficient documentation

## 2014-09-21 DIAGNOSIS — K219 Gastro-esophageal reflux disease without esophagitis: Secondary | ICD-10-CM | POA: Diagnosis not present

## 2014-09-21 DIAGNOSIS — K279 Peptic ulcer, site unspecified, unspecified as acute or chronic, without hemorrhage or perforation: Secondary | ICD-10-CM | POA: Insufficient documentation

## 2014-09-21 DIAGNOSIS — M25562 Pain in left knee: Secondary | ICD-10-CM | POA: Diagnosis not present

## 2014-09-21 DIAGNOSIS — Z87891 Personal history of nicotine dependence: Secondary | ICD-10-CM | POA: Diagnosis not present

## 2014-09-21 DIAGNOSIS — E785 Hyperlipidemia, unspecified: Secondary | ICD-10-CM | POA: Insufficient documentation

## 2014-09-21 DIAGNOSIS — R252 Cramp and spasm: Secondary | ICD-10-CM | POA: Insufficient documentation

## 2014-09-21 DIAGNOSIS — M542 Cervicalgia: Secondary | ICD-10-CM | POA: Insufficient documentation

## 2014-09-21 DIAGNOSIS — R52 Pain, unspecified: Secondary | ICD-10-CM | POA: Diagnosis present

## 2014-09-21 LAB — BASIC METABOLIC PANEL
Anion gap: 9 (ref 5–15)
BUN: 10 mg/dL (ref 6–20)
CO2: 31 mmol/L (ref 22–32)
Calcium: 10.4 mg/dL — ABNORMAL HIGH (ref 8.9–10.3)
Chloride: 98 mmol/L — ABNORMAL LOW (ref 101–111)
Creatinine, Ser: 0.84 mg/dL (ref 0.44–1.00)
GFR calc Af Amer: >60 mL/min (ref >60)
GFR calc non Af Amer: >60 mL/min (ref >60)
Glucose, Bld: 103 mg/dL — ABNORMAL HIGH (ref 65–99)
Potassium: 4.3 mmol/L (ref 3.5–5.1)
Sodium: 138 mmol/L (ref 135–145)

## 2014-09-21 MED ORDER — KETOROLAC TROMETHAMINE 60 MG/2ML IM SOLN
60.0000 mg | Freq: Once | INTRAMUSCULAR | Status: AC
  Administered 2014-09-21: 60 mg via INTRAMUSCULAR

## 2014-09-21 NOTE — ED Notes (Signed)
Patient complains of knee pain bilateral, leg pain and neck pain. Patient states that she saw the walk in clinic at [facility] on Friday for bilateral leg swelling and they placed her on lasix. She states that improved. She states that she is seeing the Dr's at [facility] on Tuesday but she states that the pain is too bad she couldn't wait any longer. She states that the pain is worse with movement.

## 2014-09-21 NOTE — ED Provider Notes (Signed)
CSN: [id number]     Arrival date & time 09/21/14  1416 History   First MD Initiated Contact with Patient 09/21/14 1447     Chief Complaint  Patient presents with  . Pain    Multiple sites   (Consider location/radiation/quality/duration/timing/severity/associated sxs/prior Treatment) HPI Comments: 64 yo female with a 3-4 months h/o chronic knee pain and h/o chronic neck pain (secondary to "arthritis" per patient) presents with complaint of pain to both. Denies any recent injuries, falls, redness, fevers, chills. States her lower legs and feet were swollen last week and was given Lasix on Friday for her lower extremity edema. States swelling has resolved but has been having cramps to her lower legs.   The history is provided by the patient.    Past Medical History  Diagnosis Date  . Chronic fatigue   . Obstipation   . Depression   . Hyperlipemia   . GERD (gastroesophageal reflux disease)   . PUD (peptic ulcer disease)   . Migraine   . Osteopenia   . Bowel obstruction    Past Surgical History  Procedure Laterality Date  . Appendectomy  2005  . Cholecystectomy  2004  . Total abdominal hysterectomy w/ bilateral salpingoophorectomy  1986  . Abdominal surgery     Family History  Problem Relation Age of Onset  . Heart disease Mother     AMI  . Stroke Mother   . Heart disease Father   . Hypertension Sister   . Hypertension Sister   . Heart disease Sister     MI  . Diabetes Sister    History  Substance Use Topics  . Smoking status: Former Smoker -- 1.00 packs/day for 40 years    Types: Cigarettes    Quit date: 09/25/2013  . Smokeless tobacco: Never Used     Comment:    . Alcohol Use: No   OB History    No data available     Review of Systems  Allergies  Aspirin; Buspirone hcl; Ciprofloxacin; Codeine; Diazepam; Duloxetine; Ibuprofen; Oxycodone-acetaminophen; Pregabalin; Propoxyphene hcl; Risperidone; Sulfonamide derivatives; Tramadol hcl; and Venlafaxine  Home  Medications   Prior to Admission medications   Medication Sig Start Date End Date Taking? Authorizing Provider  ALPRAZolam Duanne Moron) 1 MG tablet TAKE 1 TABLET BY MOUTH 3 TIMES A DAY 09/08/14  Yes Venia Carbon, MD  cholecalciferol (VITAMIN D) 1000 UNITS tablet Take 1,000 Units by mouth daily.   Yes Historical Provider, MD  gabapentin (NEURONTIN) 600 MG tablet TAKE 4 TABLETS BY MOUTH AT BEDTIME 05/23/14  Yes Venia Carbon, MD  HYDROcodone-acetaminophen (NORCO/VICODIN) 5-325 MG per tablet Take 1 tablet by mouth 3 (three) times daily as needed. 07/28/14  Yes Venia Carbon, MD  ibuprofen (ADVIL,MOTRIN) 200 MG tablet Take 400 mg by mouth every 6 (six) hours as needed.   Yes Historical Provider, MD  omeprazole (PRILOSEC) 20 MG capsule TAKE 1 CAPSULE BY MOUTH TWICE A DAY 06/02/14  Yes Venia Carbon, MD  triamcinolone cream (KENALOG) 0.1 % Apply 1 application topically 2 (two) times daily as needed. 08/27/14  Yes Venia Carbon, MD  cephALEXin (KEFLEX) 500 MG capsule Take 1 capsule (500 mg total) by mouth 3 (three) times daily. 09/11/14 09/21/14  Nance Pear, MD  imipramine (TOFRANIL) 25 MG tablet Take 2-3 tablets (50-75 mg total) by mouth at bedtime. 08/27/14   Venia Carbon, MD   BP 107/65 mmHg  Pulse 77  Temp(Src) 97.7 F (36.5 C) (Tympanic)  Resp 16  Ht 5\' 2"  (1.575 m)  Wt 135 lb (61.236 kg)  BMI 24.69 kg/m2  SpO2 99% Physical Exam  Constitutional: She appears well-developed and well-nourished. No distress.  Musculoskeletal: She exhibits no edema.       Cervical back: She exhibits normal range of motion, no tenderness, no bony tenderness, no swelling, no edema, no deformity, no laceration, no spasm and normal pulse.  Mild articular crepitus with flexion/extension of both knees; otherwise normal exam  Skin: She is not diaphoretic.  Vitals reviewed.   ED Course  Procedures (including critical care time) Labs Review Labs Reviewed  BASIC METABOLIC PANEL - Abnormal; Notable for  the following:    Chloride 98 (*)    Glucose, Bld 103 (*)    Calcium 10.4 (*)    All other components within normal limits    Imaging Review No results found.   MDM   1. Arthralgia of both knees   2. Neck pain   3. Muscle cramps   (likely secondary to osteoarthritis)  Plan: 1. diagnosis reviewed with patient 2. Patient given toradol 60mg  IM x 1 in clinic; tolerated well 3. Recommend supportive treatment with rest, heat, range of motion exercises 4. F/u with orthopedist and PCP as scheduled next week or prn if symptoms worsen or don't improve    Norval Gable, MD 09/21/14 1620

## 2014-09-22 ENCOUNTER — Other Ambulatory Visit: Payer: Self-pay

## 2014-09-22 MED ORDER — HYDROCODONE-ACETAMINOPHEN 5-325 MG PO TABS: 1.0000 | ORAL_TABLET | Freq: Three times a day (TID) | ORAL | Status: DC | PRN

## 2014-09-22 NOTE — Telephone Encounter (Signed)
Spoke with patient and she's doing fine, she was confused because she went to [facility] Sunday for arthritis and they told her (she thought) her Hgb was low at 10.4, but when I look at the labs it was her calcium was high at 10.4. Pt is asking what does she need to do? Please advise  Spoke with patient and advised rx ready for pick-up and it will be at the front desk.

## 2014-09-22 NOTE — Telephone Encounter (Signed)
See if she is better from her ER visit last week. Schedule F/U if not

## 2014-09-22 NOTE — Telephone Encounter (Signed)
Spoke with patient and advised results   

## 2014-09-22 NOTE — Telephone Encounter (Signed)
Pt left v/m requesting rx hydrocodone apap. Call when ready for pick up. rx last printed # 90 on 07/28/14. Last seen 08/15/2014.

## 2014-09-22 NOTE — Telephone Encounter (Signed)
That calcium is borderline and not really a concern

## 2014-09-23 DIAGNOSIS — M25561 Pain in right knee: Secondary | ICD-10-CM | POA: Diagnosis not present

## 2014-09-23 DIAGNOSIS — M2391 Unspecified internal derangement of right knee: Secondary | ICD-10-CM | POA: Diagnosis not present

## 2014-10-12 ENCOUNTER — Other Ambulatory Visit: Payer: Self-pay | Admitting: Internal Medicine

## 2014-10-13 NOTE — Telephone Encounter (Signed)
Approved: okay #[id number] x 0 

## 2014-10-13 NOTE — Telephone Encounter (Signed)
09/08/2014 

## 2014-10-13 NOTE — Telephone Encounter (Signed)
rx called into pharmacy

## 2014-10-14 DIAGNOSIS — N390 Urinary tract infection, site not specified: Secondary | ICD-10-CM | POA: Diagnosis not present

## 2014-10-14 DIAGNOSIS — R399 Unspecified symptoms and signs involving the genitourinary system: Secondary | ICD-10-CM | POA: Diagnosis not present

## 2014-10-24 ENCOUNTER — Encounter: Payer: Self-pay | Admitting: Internal Medicine

## 2014-10-24 ENCOUNTER — Ambulatory Visit (INDEPENDENT_AMBULATORY_CARE_PROVIDER_SITE_OTHER): Payer: Medicare Other | Admitting: Internal Medicine

## 2014-10-24 VITALS — BP 120/70 | HR 62 | Temp 97.4°F | Ht 62.0 in | Wt 124.0 lb

## 2014-10-24 DIAGNOSIS — G43009 Migraine without aura, not intractable, without status migrainosus: Secondary | ICD-10-CM

## 2014-10-24 DIAGNOSIS — Z0000 Encounter for general adult medical examination without abnormal findings: Secondary | ICD-10-CM | POA: Diagnosis not present

## 2014-10-24 DIAGNOSIS — F39 Unspecified mood [affective] disorder: Secondary | ICD-10-CM

## 2014-10-24 DIAGNOSIS — Z23 Encounter for immunization: Secondary | ICD-10-CM | POA: Diagnosis not present

## 2014-10-24 DIAGNOSIS — Z7189 Other specified counseling: Secondary | ICD-10-CM | POA: Diagnosis not present

## 2014-10-24 DIAGNOSIS — R1030 Lower abdominal pain, unspecified: Secondary | ICD-10-CM | POA: Insufficient documentation

## 2014-10-24 DIAGNOSIS — M797 Fibromyalgia: Secondary | ICD-10-CM

## 2014-10-24 LAB — POCT URINALYSIS DIPSTICK
Bilirubin, UA: NEGATIVE
Blood, UA: NEGATIVE
Glucose, UA: NEGATIVE
Ketones, UA: NEGATIVE
Leukocytes, UA: NEGATIVE
Nitrite, UA: NEGATIVE
Spec Grav, UA: 1.01
Urobilinogen, UA: NEGATIVE
pH, UA: 7

## 2014-10-24 NOTE — Assessment & Plan Note (Addendum)
Has urinary symptoms as well Seems more to be chronic cystitis Urinalysis benign--- discussed that she doesn't have infection now

## 2014-10-24 NOTE — Patient Instructions (Signed)
Please restart the imipramine.

## 2014-10-24 NOTE — Progress Notes (Signed)
Pre visit review using our clinic review tool, if applicable. No additional management support is needed unless otherwise documented below in the visit note. 

## 2014-10-24 NOTE — Progress Notes (Signed)
Subjective:    Patient ID: Lindsey Ward, female    DOB: Apr 25, 1950, 64 y.o.   MRN: [id number]  HPI Here for initial Medicare wellness and follow up of chronic medical conditions Reviewed form and advanced directives Reviewed other doctors No tobacco now--quit last year No alcohol Vision is okay Mild hearing issues--nothing major (occ tinnitus) No falls Ongoing mood issues Independent with instrumental ADLs She feels her memory is "terrible". Does bills, etc. No clear apraxia  Recent ER visits for knee pain This is better  Also had abdominal pain and diagnosed with UTI. Had to go back to urgent care for recurrent symptoms. Rx again which just stopped 3-4 days ago. Now with some back pain again and increased frequency  Headaches coming back Off atenolol due to bradycardia Feels imipramine didn't help  Has generalized pain still Uses the hydrocodone for this Gabapentin at bedtime--taking all at night since it helps sleep  Was having more depressed feelings Spent a lot of time in bed due to pain of fibromyalgia  Had bad chest pain the other night Didn't go to ER Went away on its own No SOB  Current Outpatient Prescriptions on File Prior to Visit  Medication Sig Dispense Refill  . ALPRAZolam (XANAX) 1 MG tablet TAKE 1 TABLET BY MOUTH 3 TIMES A DAY 120 tablet 0  . cholecalciferol (VITAMIN D) 1000 UNITS tablet Take 1,000 Units by mouth daily.    Marland Kitchen gabapentin (NEURONTIN) 600 MG tablet TAKE 4 TABLETS BY MOUTH AT BEDTIME 360 tablet 3  . HYDROcodone-acetaminophen (NORCO/VICODIN) 5-325 MG per tablet Take 1 tablet by mouth 3 (three) times daily as needed. 90 tablet 0  . ibuprofen (ADVIL,MOTRIN) 200 MG tablet Take 400 mg by mouth every 6 (six) hours as needed.    Marland Kitchen omeprazole (PRILOSEC) 20 MG capsule TAKE 1 CAPSULE BY MOUTH TWICE A DAY 180 capsule 2  . triamcinolone cream (KENALOG) 0.1 % Apply 1 application topically 2 (two) times daily as needed. 30 g 0   No current  facility-administered medications on file prior to visit.    Allergies  Allergen Reactions  . Aspirin     REACTION: GI bleed  . Buspirone Hcl     REACTION: unspecified  . Ciprofloxacin     REACTION: unspecified  . Codeine   . Diazepam   . Duloxetine     REACTION: sz like activity  . Ibuprofen     REACTION: GI bleed  . Oxycodone-Acetaminophen     REACTION: unspecified  . Pregabalin     REACTION: kept her up and confusion  . Propoxyphene Hcl   . Risperidone     REACTION: confusion  . Sulfonamide Derivatives     REACTION: unspecified  . Tramadol Hcl     REACTION: doesn't remember what happened but couldn't tolerate  . Venlafaxine     REACTION: unspecified    Past Medical History  Diagnosis Date  . Chronic fatigue   . Obstipation   . Depression   . Hyperlipemia   . GERD (gastroesophageal reflux disease)   . PUD (peptic ulcer disease)   . Migraine   . Osteopenia   . Bowel obstruction     Past Surgical History  Procedure Laterality Date  . Appendectomy  2005  . Cholecystectomy  2004  . Total abdominal hysterectomy w/ bilateral salpingoophorectomy  1986  . Abdominal surgery      Family History  Problem Relation Age of Onset  . Heart disease Mother  AMI  . Stroke Mother   . Heart disease Father   . Hypertension Sister   . Hypertension Sister   . Heart disease Sister     MI  . Diabetes Sister     Social History   Social History  . Marital Status: Married    Spouse Name: N/A  . Number of Children: 1  . Years of Education: N/A   Occupational History  . DISABLED    Social History Main Topics  . Smoking status: Former Smoker -- 1.00 packs/day for 40 years    Types: Cigarettes    Quit date: 09/25/2013  . Smokeless tobacco: Never Used     Comment:    . Alcohol Use: No  . Drug Use: No  . Sexual Activity: Not on file   Other Topics Concern  . Not on file   Social History Narrative   Living alone again      No living will   Daughter  should make health care decisions   Requests DNR---done 10/24/14   No tube feeds if cognitively unaware   Review of Systems Wears seat belt Teeth okay--regular with dentist No syncope. Chronic vague dizziness No rash or suspicious lesions on skin Sleeps okay with gabapentin and 2 xanax Bowels are okay No vomiting-- recurrent nausea, especially in past 4 weeks    Objective:   Physical Exam  Constitutional: She is oriented to person, place, and time. She appears well-developed and well-nourished. No distress.  HENT:  Mouth/Throat: Oropharynx is clear and moist. No oropharyngeal exudate.  Neck: Normal range of motion. Neck supple. No thyromegaly present.  Cardiovascular: Normal rate, regular rhythm, normal heart sounds and intact distal pulses.  Exam reveals no gallop.   No murmur heard. Pulmonary/Chest: Effort normal and breath sounds normal. No respiratory distress. She has no wheezes. She has no rales.  Abdominal: Soft. She exhibits no distension. There is no rebound and no guarding.  Mild generalized abdominal pain  Musculoskeletal: She exhibits no edema or tenderness.  Lymphadenopathy:    She has no cervical adenopathy.  Neurological: She is alert and oriented to person, place, and time.  President-- "Obama, ?" 100-93-?? [id number] Recall 2/3   Skin: No rash noted. No erythema.  Psychiatric:  Usual anxiety           Assessment & Plan:

## 2014-10-24 NOTE — Assessment & Plan Note (Signed)
Dysthymia and anxiety/panic are chronic Mostly only relief with the alprazolam Certainly a major component of somatization to her chronic symptoms

## 2014-10-24 NOTE — Addendum Note (Signed)
Addended by: Despina Hidden on: 10/24/2014 03:25 PM   Modules accepted: Orders

## 2014-10-24 NOTE — Assessment & Plan Note (Signed)
Ongoing chronic pain On gabapentin and sleeping better Discussed regular walking, etc

## 2014-10-24 NOTE — Assessment & Plan Note (Signed)
I have personally reviewed the Medicare Annual Wellness questionnaire and have noted 1. The patient's medical and social history 2. Their use of alcohol, tobacco or illicit drugs 3. Their current medications and supplements 4. The patient's functional ability including ADL's, fall risks, home safety risks and hearing or visual             impairment. 5. Diet and physical activities 6. Evidence for depression or mood disorders  The patients weight, height, BMI and visual acuity have been recorded in the chart I have made referrals, counseling and provided education to the patient based review of the above and I have provided the pt with a written personalized care plan for preventive services.  I have provided you with a copy of your personalized plan for preventive services. Please take the time to review along with your updated medication list.  Due for mammogram 12/17 Colon due 2022 if still doing prevnar today

## 2014-10-24 NOTE — Assessment & Plan Note (Signed)
Will restart the imipramine in hopes of prophylaxis of this and helping bladder symptoms

## 2014-10-24 NOTE — Assessment & Plan Note (Signed)
DNR order done

## 2014-11-11 ENCOUNTER — Other Ambulatory Visit: Payer: Self-pay

## 2014-11-11 MED ORDER — HYDROCODONE-ACETAMINOPHEN 5-325 MG PO TABS: 1.0000 | ORAL_TABLET | Freq: Three times a day (TID) | ORAL | Status: DC | PRN

## 2014-11-11 NOTE — Telephone Encounter (Signed)
Pt left v/m requesting rx hydrocodone apap. Call when ready for pick up. rx last printed # 90 on 09/22/14. Last annual exam on 10/24/14.

## 2014-11-11 NOTE — Telephone Encounter (Signed)
Left message on machine that rx is ready for pick-up, and it will be at our front desk.  

## 2014-11-24 ENCOUNTER — Ambulatory Visit: Payer: Medicare Other | Admitting: Internal Medicine

## 2014-11-26 ENCOUNTER — Other Ambulatory Visit: Payer: Self-pay | Admitting: Internal Medicine

## 2014-11-26 NOTE — Telephone Encounter (Signed)
Approved: okay #[id number] x 0 

## 2014-11-26 NOTE — Telephone Encounter (Signed)
10/13/2014 

## 2014-11-26 NOTE — Telephone Encounter (Signed)
rx called into pharmacy

## 2014-11-28 DIAGNOSIS — M542 Cervicalgia: Secondary | ICD-10-CM | POA: Diagnosis not present

## 2014-11-28 DIAGNOSIS — M797 Fibromyalgia: Secondary | ICD-10-CM | POA: Diagnosis not present

## 2014-11-28 DIAGNOSIS — M4722 Other spondylosis with radiculopathy, cervical region: Secondary | ICD-10-CM | POA: Diagnosis not present

## 2014-12-09 DIAGNOSIS — S91312A Laceration without foreign body, left foot, initial encounter: Secondary | ICD-10-CM | POA: Diagnosis not present

## 2014-12-10 DIAGNOSIS — D485 Neoplasm of uncertain behavior of skin: Secondary | ICD-10-CM | POA: Diagnosis not present

## 2014-12-10 DIAGNOSIS — L570 Actinic keratosis: Secondary | ICD-10-CM | POA: Diagnosis not present

## 2014-12-10 DIAGNOSIS — Z85828 Personal history of other malignant neoplasm of skin: Secondary | ICD-10-CM | POA: Diagnosis not present

## 2014-12-10 DIAGNOSIS — L821 Other seborrheic keratosis: Secondary | ICD-10-CM | POA: Diagnosis not present

## 2014-12-24 DIAGNOSIS — H02053 Trichiasis without entropian right eye, unspecified eyelid: Secondary | ICD-10-CM | POA: Diagnosis not present

## 2014-12-28 ENCOUNTER — Other Ambulatory Visit: Payer: Self-pay | Admitting: Internal Medicine

## 2014-12-29 NOTE — Telephone Encounter (Signed)
11/26/2014 last filled  [place], Please send back to me for call in

## 2014-12-30 NOTE — Telephone Encounter (Signed)
Please call in.  Thanks.   

## 2014-12-30 NOTE — Telephone Encounter (Signed)
rx called into pharmacy

## 2015-01-14 ENCOUNTER — Encounter: Payer: Self-pay | Admitting: Family Medicine

## 2015-01-14 ENCOUNTER — Ambulatory Visit (INDEPENDENT_AMBULATORY_CARE_PROVIDER_SITE_OTHER): Payer: Medicare Other | Admitting: Family Medicine

## 2015-01-14 VITALS — BP 100/62 | HR 72 | Temp 97.8°F | Wt 126.2 lb

## 2015-01-14 DIAGNOSIS — M545 Low back pain: Secondary | ICD-10-CM | POA: Diagnosis not present

## 2015-01-14 DIAGNOSIS — N3090 Cystitis, unspecified without hematuria: Secondary | ICD-10-CM | POA: Diagnosis not present

## 2015-01-14 DIAGNOSIS — R300 Dysuria: Secondary | ICD-10-CM | POA: Diagnosis not present

## 2015-01-14 LAB — POCT URINALYSIS DIPSTICK
Bilirubin, UA: NEGATIVE
Blood, UA: NEGATIVE
Glucose, UA: NEGATIVE
Ketones, UA: NEGATIVE
Leukocytes, UA: NEGATIVE
Nitrite, UA: POSITIVE
Protein, UA: NEGATIVE
Spec Grav, UA: 1.015
Urobilinogen, UA: 0.2
pH, UA: 6

## 2015-01-14 MED ORDER — CEPHALEXIN 500 MG PO CAPS: 500.0000 mg | ORAL_CAPSULE | Freq: Two times a day (BID) | ORAL | Status: DC

## 2015-01-14 NOTE — Assessment & Plan Note (Signed)
Nontoxic.  Start keflex and drink plenty of fluids.  D/w pt.  ucx pending.  She agrees. See AVS.

## 2015-01-14 NOTE — Progress Notes (Signed)
Pre visit review using our clinic review tool, if applicable. No additional management support is needed unless otherwise documented below in the visit note.  Dysuria: change in urine odor.  No burning with urination.  She has some frequency and doesn't completely empty.   duration of symptoms: Started about 2 weeks ago, has been going on intermittently.   abdominal pain: she has R lower abd pain at baseline - longstanding and at baseline.  Fevers: no back pain: some lower back pain.  Vomiting: no U/a d/w pt.  ucx pending.   Allergies d/w pt.    Meds, vitals, and allergies reviewed.   ROS: See HPI.  Otherwise negative.    GEN: nad, alert and oriented HEENT: mucous membranes moist NECK: supple CV: rrr.  PULM: ctab, no inc wob ABD: soft, +bs, suprapubic area not tender EXT: no edema BACK: no CVA pain

## 2015-01-14 NOTE — Patient Instructions (Signed)
Drink plenty of water and start the antibiotics today.  We'll contact you with your lab report.  Take care.  Glad to see you.  

## 2015-01-15 ENCOUNTER — Telehealth: Payer: Self-pay | Admitting: Internal Medicine

## 2015-01-15 NOTE — Telephone Encounter (Signed)
Please review previous msg and advise

## 2015-01-15 NOTE — Telephone Encounter (Signed)
Pt is aware as instructed 

## 2015-01-15 NOTE — Telephone Encounter (Signed)
Please let her know that I don't think this is particularly important. We can discuss this at her next visit--there is no rush even if we want to check it

## 2015-01-15 NOTE — Telephone Encounter (Signed)
Pt called asking if she needed to be tested or vaccinated for Hep C. She saw a commercial on tv about checking with the primary care provider if this needs to be done for them. The best number to contact her back at is [phone].

## 2015-01-16 LAB — URINE CULTURE: Colony Count: >=100000

## 2015-01-21 ENCOUNTER — Other Ambulatory Visit: Payer: Self-pay | Admitting: *Deleted

## 2015-01-21 NOTE — Telephone Encounter (Signed)
Last filled #90 11/11/14

## 2015-01-22 ENCOUNTER — Encounter: Payer: Self-pay | Admitting: Internal Medicine

## 2015-01-22 DIAGNOSIS — Z79891 Long term (current) use of opiate analgesic: Secondary | ICD-10-CM | POA: Diagnosis not present

## 2015-01-22 DIAGNOSIS — Z79899 Other long term (current) drug therapy: Secondary | ICD-10-CM | POA: Diagnosis not present

## 2015-01-22 MED ORDER — HYDROCODONE-ACETAMINOPHEN 5-325 MG PO TABS: 1.0000 | ORAL_TABLET | Freq: Three times a day (TID) | ORAL | Status: DC | PRN

## 2015-01-22 NOTE — Telephone Encounter (Signed)
Spoke with patient and advised rx ready for pick-up and it will be at the front desk.  

## 2015-01-26 ENCOUNTER — Other Ambulatory Visit: Payer: Self-pay | Admitting: Internal Medicine

## 2015-01-26 DIAGNOSIS — Z1231 Encounter for screening mammogram for malignant neoplasm of breast: Secondary | ICD-10-CM

## 2015-01-27 DIAGNOSIS — Z01419 Encounter for gynecological examination (general) (routine) without abnormal findings: Secondary | ICD-10-CM | POA: Diagnosis not present

## 2015-01-27 DIAGNOSIS — Z1211 Encounter for screening for malignant neoplasm of colon: Secondary | ICD-10-CM | POA: Diagnosis not present

## 2015-01-27 DIAGNOSIS — Z124 Encounter for screening for malignant neoplasm of cervix: Secondary | ICD-10-CM | POA: Diagnosis not present

## 2015-02-02 ENCOUNTER — Encounter: Payer: Self-pay | Admitting: Emergency Medicine

## 2015-02-02 ENCOUNTER — Ambulatory Visit: Payer: Medicare Other | Admitting: Internal Medicine

## 2015-02-02 ENCOUNTER — Ambulatory Visit: Payer: Medicare Other | Admitting: Family Medicine

## 2015-02-02 ENCOUNTER — Telehealth: Payer: Self-pay | Admitting: Family Medicine

## 2015-02-02 ENCOUNTER — Emergency Department
Admission: EM | Admit: 2015-02-02 | Discharge: 2015-02-02 | Disposition: A | Discharge status: Home / Self Care | Payer: Medicare Other | Attending: Emergency Medicine | Admitting: Emergency Medicine

## 2015-02-02 DIAGNOSIS — Z792 Long term (current) use of antibiotics: Secondary | ICD-10-CM | POA: Insufficient documentation

## 2015-02-02 DIAGNOSIS — M6281 Muscle weakness (generalized): Secondary | ICD-10-CM | POA: Diagnosis not present

## 2015-02-02 DIAGNOSIS — Z79899 Other long term (current) drug therapy: Secondary | ICD-10-CM | POA: Diagnosis not present

## 2015-02-02 DIAGNOSIS — R42 Dizziness and giddiness: Secondary | ICD-10-CM | POA: Diagnosis not present

## 2015-02-02 DIAGNOSIS — R531 Weakness: Secondary | ICD-10-CM | POA: Diagnosis present

## 2015-02-02 DIAGNOSIS — Z87891 Personal history of nicotine dependence: Secondary | ICD-10-CM | POA: Insufficient documentation

## 2015-02-02 DIAGNOSIS — F419 Anxiety disorder, unspecified: Secondary | ICD-10-CM | POA: Diagnosis not present

## 2015-02-02 HISTORY — DX: Fibromyalgia: M79.7

## 2015-02-02 LAB — COMPREHENSIVE METABOLIC PANEL
ALT: 19 U/L (ref 14–54)
AST: 28 U/L (ref 15–41)
Albumin: 4.1 g/dL (ref 3.5–5.0)
Alkaline Phosphatase: 90 U/L (ref 38–126)
Anion gap: 9 (ref 5–15)
BUN: 12 mg/dL (ref 6–20)
CO2: 24 mmol/L (ref 22–32)
Calcium: 9.4 mg/dL (ref 8.9–10.3)
Chloride: 106 mmol/L (ref 101–111)
Creatinine, Ser: 0.69 mg/dL (ref 0.44–1.00)
GFR calc Af Amer: >60 mL/min (ref >60)
GFR calc non Af Amer: >60 mL/min (ref >60)
Glucose, Bld: 124 mg/dL — ABNORMAL HIGH (ref 65–99)
Potassium: 3.5 mmol/L (ref 3.5–5.1)
Sodium: 139 mmol/L (ref 135–145)
Total Bilirubin: 0.8 mg/dL (ref 0.3–1.2)
Total Protein: 7 g/dL (ref 6.5–8.1)

## 2015-02-02 LAB — CBC
HCT: 38.2 % (ref 35.0–47.0)
Hemoglobin: 12.9 g/dL (ref 12.0–16.0)
MCH: 31.6 pg (ref 26.0–34.0)
MCHC: 33.9 g/dL (ref 32.0–36.0)
MCV: 93.3 fL (ref 80.0–100.0)
Platelets: 236 10*3/uL (ref 150–440)
RBC: 4.1 MIL/uL (ref 3.80–5.20)
RDW: 12.5 % (ref 11.5–14.5)
WBC: 9.2 10*3/uL (ref 3.6–11.0)

## 2015-02-02 LAB — TROPONIN I: Troponin I: <0.03 ng/mL (ref <0.031)

## 2015-02-02 MED ORDER — SODIUM CHLORIDE 0.9 % IV BOLUS (SEPSIS)
1000.0000 mL | Freq: Once | INTRAVENOUS | Status: AC
  Administered 2015-02-02: 1000 mL via INTRAVENOUS

## 2015-02-02 MED ORDER — ACETAMINOPHEN 325 MG PO TABS
650.0000 mg | ORAL_TABLET | Freq: Once | ORAL | Status: AC
  Administered 2015-02-02: 650 mg via ORAL
  Filled 2015-02-02: qty 2

## 2015-02-02 NOTE — Discharge Instructions (Signed)

## 2015-02-02 NOTE — ED Notes (Signed)
Up to [facility], unable to void, does not feel urge

## 2015-02-02 NOTE — ED Notes (Signed)
Pt came via EMS. Pt sts she felt sick yesterday and reports her whole body feeling achy. Reports having the chills and felt nauseated. Denies diarrhea. She made a doctors appointment for today but felt too weak to go so she called EMS.

## 2015-02-02 NOTE — Telephone Encounter (Signed)
FYI,Pt cannot make appt today due to not being able to walk. Pt is on her way to the ED. Appt is still on the schedule. Thank yOu!

## 2015-02-02 NOTE — ED Provider Notes (Signed)
[facility] Emergency Department Provider Note  ____________________________________________  Time seen: On arrival  I have reviewed the triage vital signs and the nursing notes.   HISTORY  Chief Complaint Weakness    HPI Lindsey Ward is a 64 y.o. female who presents with dizziness today. She reports yesterday she felt somewhat ill and achy all over with some chills and mild nausea. She denies chest pain. She made a doctor's appointment for today but felt lightheaded this morning so called EMS instead. She denies shortness of breath, no recent travel, no calf pain. She denies headache, fevers or cough.     Past Medical History  Diagnosis Date  . Chronic fatigue   . Obstipation   . Depression   . Hyperlipemia   . GERD (gastroesophageal reflux disease)   . PUD (peptic ulcer disease)   . Migraine   . Osteopenia   . Bowel obstruction ([place])   . Fibromyalgia     Patient Active Problem List   Diagnosis Date Noted  . Cystitis 01/14/2015  . Lower abdominal pain 10/24/2014  . Advance directive discussed with patient 10/24/2014  . Counseling regarding advanced directives 10/21/2013  . Low back pain 02/11/2013  . Routine general medical examination at a health care facility 07/14/2010  . OSTEOPENIA 03/10/2009  . Episodic mood disorder ([place]) 08/15/2006  . DEPRESSION 08/15/2006  . GERD 08/15/2006  . Fibromyalgia 08/15/2006  . Sleep disturbance 08/15/2006  . HYPERLIPIDEMIA 12/20/2005  . PANIC ATTACKS 12/20/2005  . Migraine without aura 12/20/2005    Past Surgical History  Procedure Laterality Date  . Appendectomy  2005  . Cholecystectomy  2004  . Total abdominal hysterectomy w/ bilateral salpingoophorectomy  1986  . Abdominal surgery      Current Outpatient Rx  Name  Route  Sig  Dispense  Refill  . ALPRAZolam (XANAX) 1 MG tablet      TAKE 1 TABLET BY MOUTH 3 TIMES A DAY   120 tablet   0     Not to exceed 5 additional fills before  05/25/2015 ...   . cephALEXin (KEFLEX) 500 MG capsule   Oral   Take 1 capsule (500 mg total) by mouth 2 (two) times daily.   14 capsule   0   . cholecalciferol (VITAMIN D) 1000 UNITS tablet   Oral   Take 1,000 Units by mouth daily.         Marland Kitchen gabapentin (NEURONTIN) 600 MG tablet      TAKE 4 TABLETS BY MOUTH AT BEDTIME   360 tablet   3   . HYDROcodone-acetaminophen (NORCO/VICODIN) 5-325 MG tablet   Oral   Take 1 tablet by mouth 3 (three) times daily as needed.   90 tablet   0   . omeprazole (PRILOSEC) 20 MG capsule      TAKE 1 CAPSULE BY MOUTH TWICE A DAY   180 capsule   2   . triamcinolone cream (KENALOG) 0.1 %   Topical   Apply 1 application topically 2 (two) times daily as needed. Patient not taking: Reported on 01/14/2015   30 g   0     Allergies Tizanidine; Aspirin; Buspirone hcl; Ciprofloxacin; Codeine; Diazepam; Duloxetine; Ibuprofen; Oxycodone-acetaminophen; Pregabalin; Propoxyphene hcl; Risperidone; Sulfonamide derivatives; Tramadol hcl; and Venlafaxine  Family History  Problem Relation Age of Onset  . Heart disease Mother     AMI  . Stroke Mother   . Heart disease Father   . Hypertension Sister   . Hypertension Sister   .  Heart disease Sister     MI  . Diabetes Sister     Social History Social History  Substance Use Topics  . Smoking status: Former Smoker -- 1.00 packs/day for 40 years    Types: Cigarettes    Quit date: 09/25/2013  . Smokeless tobacco: Never Used     Comment:    . Alcohol Use: 0.0 oz/week    0 Standard drinks or equivalent per week     Comment: rarely    Review of Systems  Constitutional: Negative for fever. Eyes: Negative for visual changes. ENT: Negative for sore throat Cardiovascular: Negative for chest pain. Respiratory: Negative for shortness of breath. Gastrointestinal: Negative for abdominal pain, vomiting and diarrhea. Genitourinary: Negative for dysuria. Musculoskeletal: Negative for back pain. Skin:  Negative for rash. Neurological: Negative for headaches or focal weakness positive for dizziness Psychiatric: Mild anxiety    ____________________________________________   PHYSICAL EXAM:  VITAL SIGNS: ED Triage Vitals  Enc Vitals Group     BP 02/02/15 1158 102/55 mmHg     Pulse Rate 02/02/15 1158 62     Resp 02/02/15 1158 16     Temp 02/02/15 1158 97.3 F (36.3 C)     Temp Source 02/02/15 1158 Oral     SpO2 02/02/15 1158 98 %     Weight 02/02/15 1158 126 lb (57.153 kg)     Height 02/02/15 1158 5\' 1"  (1.549 m)     Head Cir --      Peak Flow --      Pain Score 02/02/15 1305 7     Pain Loc --      Pain Edu? --      Excl. in [place]? --     Constitutional: Alert and oriented. Well appearing and in no distress. Eyes: Conjunctivae are normal.  ENT   Head: Normocephalic and atraumatic.   Mouth/Throat: Mucous membranes are moist. Cardiovascular: Normal rate, regular rhythm. Normal and symmetric distal pulses are present in all extremities. No murmurs, rubs, or gallops. Respiratory: Normal respiratory effort without tachypnea nor retractions. Breath sounds are clear and equal bilaterally.  Gastrointestinal: Soft and non-tender in all quadrants. No distention. There is no CVA tenderness. Genitourinary: deferred Musculoskeletal: Nontender with normal range of motion in all extremities. No lower extremity tenderness nor edema. Neurologic:  Normal speech and language. No gross focal neurologic deficits are appreciated. Skin:  Skin is warm, dry and intact. No rash noted. Psychiatric: Mood and affect are normal. Patient exhibits appropriate insight and judgment.  ____________________________________________    LABS (pertinent positives/negatives)  Labs Reviewed  COMPREHENSIVE METABOLIC PANEL - Abnormal; Notable for the following:    Glucose, Bld 124 (*)    All other components within normal limits  CBC  TROPONIN I  URINALYSIS COMPLETEWITH MICROSCOPIC ([facility] ONLY)     ____________________________________________   EKG  ED ECG REPORT I, Lavonia Drafts, the attending physician, personally viewed and interpreted this ECG.   Date: 02/02/2015  EKG Time: 11:56 AM  Rate: 65  Rhythm: normal EKG, normal sinus rhythm  Axis: Normal axis  Intervals:none  ST&T Change: Nonspecific   ____________________________________________    RADIOLOGY I have personally reviewed any xrays that were ordered on this patient: None  ____________________________________________   PROCEDURES  Procedure(s) performed: none  Critical Care performed: none  ____________________________________________   INITIAL IMPRESSION / ASSESSMENT AND PLAN / ED COURSE  Pertinent labs & imaging results that were available during my care of the patient were reviewed by me and considered in  my medical decision making (see chart for details).  Patient with episode of lightheadedness this morning. We will check labs, EKG, give fluids and reevaluate. She does have positive orthostatics  After fluids the patient is no long orthostatic and reports feeling significantly better., Labs are unremarkable  ____________________________________________   FINAL CLINICAL IMPRESSION(S) / ED DIAGNOSES  Final diagnoses:  Dizziness     Lavonia Drafts, MD 02/02/15 1528

## 2015-02-09 ENCOUNTER — Encounter: Payer: Self-pay | Admitting: Internal Medicine

## 2015-02-09 ENCOUNTER — Ambulatory Visit (INDEPENDENT_AMBULATORY_CARE_PROVIDER_SITE_OTHER): Payer: Medicare Other | Admitting: Internal Medicine

## 2015-02-09 VITALS — BP 108/60 | HR 69 | Temp 97.9°F | Wt 127.0 lb

## 2015-02-09 DIAGNOSIS — N390 Urinary tract infection, site not specified: Secondary | ICD-10-CM

## 2015-02-09 DIAGNOSIS — R3915 Urgency of urination: Secondary | ICD-10-CM | POA: Diagnosis not present

## 2015-02-09 DIAGNOSIS — Z23 Encounter for immunization: Secondary | ICD-10-CM

## 2015-02-09 LAB — POCT URINALYSIS DIPSTICK
Bilirubin, UA: NEGATIVE
Blood, UA: NEGATIVE
Glucose, UA: NEGATIVE
Ketones, UA: NEGATIVE
Leukocytes, UA: NEGATIVE
Nitrite, UA: NEGATIVE
Protein, UA: NEGATIVE
Spec Grav, UA: 1.01
Urobilinogen, UA: 4
pH, UA: 6

## 2015-02-09 NOTE — Addendum Note (Signed)
Addended by: Cloyd Stagers B on: 02/09/2015 04:18 PM   Modules accepted: Orders, SmartSet

## 2015-02-09 NOTE — Progress Notes (Signed)
Subjective:    Patient ID: Lindsey Ward, female    DOB: 1951/01/29, 64 y.o.   MRN: [id number]  HPI Here for ER follow up and urinary symptoms Got dizzy--- BP dropped she thinks. Just fell into chair Seen in ER--no specific diagnosis Felt better after fluids and went home No recurrence  Not eating well---appetite is not great Doesn't drink that much Discussed trying milk with carnation instant breakfast  Having urinary symptoms again Bad odor again No dysuria or hematuria Some trouble emptying bladder at night-- goes frequently but small quantity  Current Outpatient Prescriptions on File Prior to Visit  Medication Sig Dispense Refill  . ALPRAZolam (XANAX) 1 MG tablet TAKE 1 TABLET BY MOUTH 3 TIMES A DAY 120 tablet 0  . gabapentin (NEURONTIN) 600 MG tablet TAKE 4 TABLETS BY MOUTH AT BEDTIME 360 tablet 3  . HYDROcodone-acetaminophen (NORCO/VICODIN) 5-325 MG tablet Take 1 tablet by mouth 3 (three) times daily as needed. 90 tablet 0  . omeprazole (PRILOSEC) 20 MG capsule TAKE 1 CAPSULE BY MOUTH TWICE A DAY 180 capsule 2  . triamcinolone cream (KENALOG) 0.1 % Apply 1 application topically 2 (two) times daily as needed. 30 g 0  . cephALEXin (KEFLEX) 500 MG capsule Take 1 capsule (500 mg total) by mouth 2 (two) times daily. (Patient not taking: Reported on 02/09/2015) 14 capsule 0  . cholecalciferol (VITAMIN D) 1000 UNITS tablet Take 1,000 Units by mouth daily.     No current facility-administered medications on file prior to visit.    Allergies  Allergen Reactions  . Tizanidine Other (See Comments)    "throws me in a different world"  . Aspirin     REACTION: GI bleed  . Buspirone Hcl     REACTION: unspecified  . Ciprofloxacin     REACTION: unspecified  . Codeine   . Diazepam   . Duloxetine     REACTION: sz like activity  . Ibuprofen     REACTION: GI bleed  . Oxycodone-Acetaminophen     REACTION: unspecified  . Pregabalin     REACTION: kept her up and confusion  .  Propoxyphene Hcl   . Risperidone     REACTION: confusion  . Sulfonamide Derivatives     REACTION: unspecified  . Tramadol Hcl     REACTION: doesn't remember what happened but couldn't tolerate  . Venlafaxine     REACTION: unspecified    Past Medical History  Diagnosis Date  . Chronic fatigue   . Obstipation   . Depression   . Hyperlipemia   . GERD (gastroesophageal reflux disease)   . PUD (peptic ulcer disease)   . Migraine   . Osteopenia   . Bowel obstruction ([place])   . Fibromyalgia     Past Surgical History  Procedure Laterality Date  . Appendectomy  2005  . Cholecystectomy  2004  . Total abdominal hysterectomy w/ bilateral salpingoophorectomy  1986  . Abdominal surgery      Family History  Problem Relation Age of Onset  . Heart disease Mother     AMI  . Stroke Mother   . Heart disease Father   . Hypertension Sister   . Hypertension Sister   . Heart disease Sister     MI  . Diabetes Sister     Social History   Social History  . Marital Status: Married    Spouse Name: N/A  . Number of Children: 1  . Years of Education: N/A   Occupational History  .  DISABLED    Social History Main Topics  . Smoking status: Former Smoker -- 1.00 packs/day for 40 years    Types: Cigarettes    Quit date: 09/25/2013  . Smokeless tobacco: Never Used     Comment:    . Alcohol Use: 0.0 oz/week    0 Standard drinks or equivalent per week     Comment: rarely  . Drug Use: No  . Sexual Activity: Not on file   Other Topics Concern  . Not on file   Social History Narrative   Living alone again      No living will   Daughter should make health care decisions   Requests DNR---done 10/24/14   No tube feeds if cognitively unaware   Review of Systems Weight down some from the summer Bowels are very slow---goes days at a time. Using probiotic in past--it helped but she got tired of taking it    Objective:   Physical Exam  Constitutional: She appears well-developed and  well-nourished. No distress.  Abdominal: Soft. She exhibits no distension. There is no tenderness. There is no rebound and no guarding.          Assessment & Plan:

## 2015-02-09 NOTE — Patient Instructions (Signed)
Please restart miralax 1 capful every day. If your bowels are not better after 1 week, restart the probiotic also. Increase your fluids--especially in the afternoon to keep your bladder full.

## 2015-02-09 NOTE — Assessment & Plan Note (Signed)
Mostly at night Discussed drinking enough fluids No signs of infection on urinalysis (and symptoms don't support this diagnosis) Reassured--no antibiotic needed Needs to restart miralax for bowels--then probiotic if needed Increase fluids

## 2015-02-11 ENCOUNTER — Other Ambulatory Visit: Payer: Self-pay | Admitting: Internal Medicine

## 2015-02-12 NOTE — Telephone Encounter (Signed)
rx called into pharmacy

## 2015-02-12 NOTE — Telephone Encounter (Signed)
Approved: #[id number] x 0 

## 2015-02-12 NOTE — Telephone Encounter (Signed)
12/30/2014 

## 2015-02-13 ENCOUNTER — Encounter: Payer: Self-pay | Admitting: Internal Medicine

## 2015-02-18 ENCOUNTER — Ambulatory Visit
Admission: RE | Admit: 2015-02-18 | Discharge: 2015-02-18 | Disposition: A | Discharge status: Home / Self Care | Payer: Medicare Other | Source: Ambulatory Visit | Attending: Internal Medicine | Admitting: Internal Medicine

## 2015-02-18 ENCOUNTER — Other Ambulatory Visit: Payer: Self-pay | Admitting: Internal Medicine

## 2015-02-18 DIAGNOSIS — Z1231 Encounter for screening mammogram for malignant neoplasm of breast: Secondary | ICD-10-CM | POA: Diagnosis not present

## 2015-02-19 ENCOUNTER — Encounter: Payer: Self-pay | Admitting: *Deleted

## 2015-02-24 DIAGNOSIS — H02053 Trichiasis without entropian right eye, unspecified eyelid: Secondary | ICD-10-CM | POA: Diagnosis not present

## 2015-02-27 ENCOUNTER — Telehealth: Payer: Self-pay

## 2015-02-27 NOTE — Telephone Encounter (Signed)
Pt received mammogram report and pt wants to know what heterogeneously dense means; advised pt that heterogeneously means not uniform throughout and dense is the dense tissue in the breast is made of milk glands and other supportive tissues. Advised pt Dr Silvio Pate said Mammogram was OK. Pt voiced understanding and does not need cb. FYI to Dr Silvio Pate.

## 2015-02-27 NOTE — Telephone Encounter (Signed)
okay

## 2015-03-05 DIAGNOSIS — M5030 Other cervical disc degeneration, unspecified cervical region: Secondary | ICD-10-CM | POA: Diagnosis not present

## 2015-03-05 DIAGNOSIS — M5412 Radiculopathy, cervical region: Secondary | ICD-10-CM | POA: Diagnosis not present

## 2015-03-25 ENCOUNTER — Other Ambulatory Visit: Payer: Self-pay | Admitting: Internal Medicine

## 2015-03-26 NOTE — Telephone Encounter (Signed)
02/12/2015 

## 2015-03-26 NOTE — Telephone Encounter (Signed)
rx called into pharmacy

## 2015-03-26 NOTE — Telephone Encounter (Signed)
Approved: #[id number] x 0 

## 2015-04-10 ENCOUNTER — Telehealth: Payer: Self-pay

## 2015-04-10 MED ORDER — LIDOCAINE 5 % EX OINT: 1.0000 "application " | TOPICAL_OINTMENT | CUTANEOUS | Status: DC | PRN

## 2015-04-10 NOTE — Telephone Encounter (Signed)
Pt left v/m; a health care mgt co suggest pt to try lidocaine ointment for neck and back pain; the health care mgt will be contacting PCP for order for lidocaine ointment. This is FYI from the pt that request is coming for ointment.

## 2015-04-10 NOTE — Telephone Encounter (Signed)
Let her know that I sent a prescription for her to try. Not sure if insurance will cover it though

## 2015-04-17 ENCOUNTER — Other Ambulatory Visit: Payer: Self-pay | Admitting: *Deleted

## 2015-04-17 MED ORDER — HYDROCODONE-ACETAMINOPHEN 5-325 MG PO TABS: 1.0000 | ORAL_TABLET | Freq: Three times a day (TID) | ORAL | Status: DC | PRN

## 2015-04-17 NOTE — Telephone Encounter (Signed)
Spoke with patient and advised rx ready for pick-up and it will be at the front desk.  

## 2015-04-17 NOTE — Telephone Encounter (Signed)
Pt left voicemail at Triage requesting refill of med, last refilled on 01/22/15 #90 with 0 refills. Pt has CPE scheduled on 10/26/15

## 2015-04-20 MED ORDER — LIDOCAINE 5 % EX OINT: 1.0000 "application " | TOPICAL_OINTMENT | CUTANEOUS | Status: DC | PRN

## 2015-04-20 NOTE — Telephone Encounter (Signed)
Please send the Rx to her mail away

## 2015-04-20 NOTE — Telephone Encounter (Signed)
rx sent to pharmacy by e-script, [facility]

## 2015-04-20 NOTE — Addendum Note (Signed)
Addended by: Despina Hidden on: 04/20/2015 03:59 PM   Modules accepted: Orders

## 2015-04-20 NOTE — Telephone Encounter (Signed)
Pt said the [facility] cost to pt was $100.00 and pt did not pick up med. pt said [facility] will fax request for lidocaine ointment; cost to pt will be $10.00. FYI to [place].

## 2015-05-05 DIAGNOSIS — M5030 Other cervical disc degeneration, unspecified cervical region: Secondary | ICD-10-CM | POA: Diagnosis not present

## 2015-05-05 DIAGNOSIS — M5412 Radiculopathy, cervical region: Secondary | ICD-10-CM | POA: Diagnosis not present

## 2015-05-14 ENCOUNTER — Other Ambulatory Visit: Payer: Self-pay | Admitting: Internal Medicine

## 2015-05-14 NOTE — Telephone Encounter (Signed)
03/26/15 

## 2015-05-14 NOTE — Telephone Encounter (Signed)
Approved: #[id number] x 0 

## 2015-05-15 NOTE — Telephone Encounter (Signed)
Left refill on voice mail at the pharmacy

## 2015-05-16 ENCOUNTER — Other Ambulatory Visit: Payer: Self-pay | Admitting: Internal Medicine

## 2015-05-18 NOTE — Telephone Encounter (Signed)
Last RX 05-23-14 #360/3 Last OV 02-09-15 Next OV  10-26-15

## 2015-05-18 NOTE — Telephone Encounter (Signed)
Approved: refill for a year please

## 2015-05-24 ENCOUNTER — Emergency Department
Admission: EM | Admit: 2015-05-24 | Discharge: 2015-05-25 | Disposition: A | Discharge status: Home / Self Care | Payer: Medicare Other | Attending: Emergency Medicine | Admitting: Emergency Medicine

## 2015-05-24 ENCOUNTER — Emergency Department: Payer: Medicare Other

## 2015-05-24 DIAGNOSIS — Z888 Allergy status to other drugs, medicaments and biological substances status: Secondary | ICD-10-CM | POA: Diagnosis not present

## 2015-05-24 DIAGNOSIS — E785 Hyperlipidemia, unspecified: Secondary | ICD-10-CM | POA: Insufficient documentation

## 2015-05-24 DIAGNOSIS — F329 Major depressive disorder, single episode, unspecified: Secondary | ICD-10-CM | POA: Insufficient documentation

## 2015-05-24 DIAGNOSIS — R42 Dizziness and giddiness: Secondary | ICD-10-CM | POA: Diagnosis not present

## 2015-05-24 DIAGNOSIS — Z87891 Personal history of nicotine dependence: Secondary | ICD-10-CM | POA: Insufficient documentation

## 2015-05-24 DIAGNOSIS — Z882 Allergy status to sulfonamides status: Secondary | ICD-10-CM | POA: Diagnosis not present

## 2015-05-24 DIAGNOSIS — Z886 Allergy status to analgesic agent status: Secondary | ICD-10-CM | POA: Diagnosis not present

## 2015-05-24 DIAGNOSIS — Z90710 Acquired absence of both cervix and uterus: Secondary | ICD-10-CM | POA: Diagnosis not present

## 2015-05-24 DIAGNOSIS — N3000 Acute cystitis without hematuria: Secondary | ICD-10-CM | POA: Diagnosis not present

## 2015-05-24 DIAGNOSIS — Z79899 Other long term (current) drug therapy: Secondary | ICD-10-CM | POA: Insufficient documentation

## 2015-05-24 DIAGNOSIS — Z881 Allergy status to other antibiotic agents status: Secondary | ICD-10-CM | POA: Insufficient documentation

## 2015-05-24 LAB — URINALYSIS COMPLETE WITH MICROSCOPIC (ARMC ONLY)
Bilirubin Urine: NEGATIVE
Glucose, UA: NEGATIVE mg/dL
Ketones, ur: NEGATIVE mg/dL
Nitrite: POSITIVE — AB
Protein, ur: NEGATIVE mg/dL
Specific Gravity, Urine: 1.012 (ref 1.005–1.030)
pH: 6 (ref 5.0–8.0)

## 2015-05-24 LAB — CBC
HCT: 38.4 % (ref 35.0–47.0)
Hemoglobin: 13.3 g/dL (ref 12.0–16.0)
MCH: 32.6 pg (ref 26.0–34.0)
MCHC: 34.7 g/dL (ref 32.0–36.0)
MCV: 94 fL (ref 80.0–100.0)
Platelets: 254 10*3/uL (ref 150–440)
RBC: 4.08 MIL/uL (ref 3.80–5.20)
RDW: 11.9 % (ref 11.5–14.5)
WBC: 11.5 10*3/uL — ABNORMAL HIGH (ref 3.6–11.0)

## 2015-05-24 LAB — BASIC METABOLIC PANEL
Anion gap: 9 (ref 5–15)
BUN: 18 mg/dL (ref 6–20)
CO2: 27 mmol/L (ref 22–32)
Calcium: 9.4 mg/dL (ref 8.9–10.3)
Chloride: 102 mmol/L (ref 101–111)
Creatinine, Ser: 0.88 mg/dL (ref 0.44–1.00)
GFR calc Af Amer: >60 mL/min (ref >60)
GFR calc non Af Amer: >60 mL/min (ref >60)
Glucose, Bld: 111 mg/dL — ABNORMAL HIGH (ref 65–99)
Potassium: 3.9 mmol/L (ref 3.5–5.1)
Sodium: 138 mmol/L (ref 135–145)

## 2015-05-24 NOTE — ED Notes (Signed)
Pt reports getting up from bed last night to go to bathroom when pt became dizzy and fell to floor, pt crawled to bathroom, walked back down hallway, fell a second time, fell several times afterwards and reports hitting her head. Pt denies LOC, or loss of vision at time of falls or post. Pt has hx of fiber myalgia, takes hydrocodone and gabapentin. Pt reports she feels she has a bladder infection, bc she had same symptoms before with infection. Pt states her urine has had a bad odor and looked "bad" for over a month.

## 2015-05-24 NOTE — ED Notes (Addendum)
Pt states that she started with back pain on Wednesday, then states that she started havng dizziness with falling yesterday, pt also having bilat lower abd pain. Pt also states that her "head started hurting yesterday when she started to fall around"

## 2015-05-25 MED ORDER — DEXTROSE 5 % IV SOLN
1.0000 g | Freq: Once | INTRAVENOUS | Status: AC
  Administered 2015-05-25: 1 g via INTRAVENOUS
  Filled 2015-05-25: qty 10

## 2015-05-25 MED ORDER — CEFIXIME 400 MG PO TABS: 400.0000 mg | ORAL_TABLET | Freq: Every day | ORAL | Status: DC

## 2015-05-25 MED ORDER — SODIUM CHLORIDE 0.9 % IV BOLUS (SEPSIS)
1000.0000 mL | Freq: Once | INTRAVENOUS | Status: AC
  Administered 2015-05-25: 1000 mL via INTRAVENOUS

## 2015-05-25 NOTE — ED Provider Notes (Signed)
[facility] Emergency Department Provider Note  ____________________________________________  Time seen: Approximately 2350 AM  I have reviewed the triage vital signs and the nursing notes.   HISTORY  Chief Complaint Dizziness    HPI Lindsey Ward is a 65 y.o. female comes into the hospital today with dizziness. The patient's daughter reports that once they started noticing some lower back pain. She reports that it continued but it was mild. They assumed it was due to the patient's fibromyalgia. The patient's daughter reports that she felt dizzy on Saturday and fell getting up to go to the bathroom. She reports that she has been having the dizziness ever since. The patient reports that she's having lightheadedness not room spinning. After the fall she was hurting in her back and her neck as well as her head but she denies hitting her head. She reports that she fell onto her back. The patient is also been having some pain in the area that her daughter reports her ovaries would be although she's had a hysterectomy. The patient reports that currently her pain is a 2 out of 10 in intensity but her urine has been very foul-smelling. The patient's daughter reports that this is been going on for the past month but they had seen her physician who told her that her urine was clean. The patient had some nausea with no chest pain and denies any pain with urination. The patient's daughter brought her in to have her evaluated for these symptoms.   Past Medical History  Diagnosis Date  . Chronic fatigue   . Obstipation   . Depression   . Hyperlipemia   . GERD (gastroesophageal reflux disease)   . PUD (peptic ulcer disease)   . Migraine   . Osteopenia   . Bowel obstruction ([place])   . Fibromyalgia     Patient Active Problem List   Diagnosis Date Noted  . Urinary urgency 02/09/2015  . Cystitis 01/14/2015  . Lower abdominal pain 10/24/2014  . Advance directive discussed  with patient 10/24/2014  . Counseling regarding advanced directives 10/21/2013  . Low back pain 02/11/2013  . Routine general medical examination at a health care facility 07/14/2010  . OSTEOPENIA 03/10/2009  . Episodic mood disorder ([place]) 08/15/2006  . DEPRESSION 08/15/2006  . GERD 08/15/2006  . Fibromyalgia 08/15/2006  . Sleep disturbance 08/15/2006  . HYPERLIPIDEMIA 12/20/2005  . PANIC ATTACKS 12/20/2005  . Migraine without aura 12/20/2005    Past Surgical History  Procedure Laterality Date  . Appendectomy  2005  . Cholecystectomy  2004  . Total abdominal hysterectomy w/ bilateral salpingoophorectomy  1986  . Abdominal surgery    . Breast biopsy Bilateral 1999    rt w/out clip - neg, lt w/clip - neg  . Breast cyst aspiration Right 2003    neg    Current Outpatient Rx  Name  Route  Sig  Dispense  Refill  . ALPRAZolam (XANAX) 1 MG tablet      TAKE 1 TABLET BY MOUTH 3 TIMES A DAY   120 tablet   0     Not to exceed 5 additional fills before 09/22/2015 ...   . cefixime (SUPRAX) 400 MG tablet   Oral   Take 1 tablet (400 mg total) by mouth daily.   10 tablet   0   . cholecalciferol (VITAMIN D) 1000 UNITS tablet   Oral   Take 1,000 Units by mouth daily.         Marland Kitchen gabapentin (  NEURONTIN) 600 MG tablet      TAKE 4 TABLETS BY MOUTH AT BEDTIME   360 tablet   3   . HYDROcodone-acetaminophen (NORCO/VICODIN) 5-325 MG tablet   Oral   Take 1 tablet by mouth 3 (three) times daily as needed.   90 tablet   0   . lidocaine (XYLOCAINE) 5 % ointment   Topical   Apply 1 application topically as needed.   50 g   1   . omeprazole (PRILOSEC) 20 MG capsule      TAKE 1 CAPSULE BY MOUTH TWICE A DAY   180 capsule   2     Allergies Tizanidine; Aspirin; Buspirone hcl; Ciprofloxacin; Codeine; Diazepam; Duloxetine; Ibuprofen; Oxycodone-acetaminophen; Pregabalin; Propoxyphene hcl; Risperidone; Sulfonamide derivatives; Tramadol hcl; and Venlafaxine  Family History  Problem  Relation Age of Onset  . Heart disease Mother     AMI  . Stroke Mother   . Heart disease Father   . Hypertension Sister   . Breast cancer Sister   . Hypertension Sister   . Heart disease Sister     MI  . Diabetes Sister     Social History Social History  Substance Use Topics  . Smoking status: Former Smoker -- 1.00 packs/day for 40 years    Types: Cigarettes    Quit date: 09/25/2013  . Smokeless tobacco: Never Used     Comment:    . Alcohol Use: 0.0 oz/week    0 Standard drinks or equivalent per week     Comment: rarely    Review of Systems Constitutional: No fever/chills Eyes: No visual changes. ENT: No sore throat. Cardiovascular: Denies chest pain. Respiratory: Denies shortness of breath. Gastrointestinal:  abdominal pain.  No nausea, no vomiting.  No diarrhea.  No constipation. Genitourinary: Foul-smelling urine Musculoskeletal:  back pain. Skin: Negative for rash. Neurological: Dizziness  10-point ROS otherwise negative.  ____________________________________________   PHYSICAL EXAM:  VITAL SIGNS: ED Triage Vitals  Enc Vitals Group     BP 05/24/15 1949 112/46 mmHg     Pulse Rate 05/24/15 1949 79     Resp 05/24/15 1949 18     Temp 05/24/15 1949 97.8 F (36.6 C)     Temp Source 05/24/15 1949 Oral     SpO2 05/24/15 1949 97 %     Weight 05/24/15 1949 127 lb (57.607 kg)     Height 05/24/15 1949 5\' 3"  (1.6 m)     Head Cir --      Peak Flow --      Pain Score 05/24/15 2003 7     Pain Loc --      Pain Edu? --      Excl. in [place]? --     Constitutional: Alert and oriented. Well appearing and in mild distress. Eyes: Conjunctivae are normal. PERRL. EOMI. Head: Atraumatic. Nose: No congestion/rhinnorhea. Mouth/Throat: Mucous membranes are moist.  Oropharynx non-erythematous. }Cardiovascular: Normal rate, regular rhythm. Grossly normal heart sounds.  Good peripheral circulation. Respiratory: Normal respiratory effort.  No retractions. Lungs  CTAB. Gastrointestinal: Soft and nontender. No distention. Mild left CVA tenderness to palpation Musculoskeletal: No lower extremity tenderness nor edema.  Neurologic:  Normal speech and language. Skin:  Skin is warm, dry and intact. Marland Kitchen Psychiatric: Mood and affect are normal. Speech and behavior are normal.  ____________________________________________   LABS (all labs ordered are listed, but only abnormal results are displayed)  Labs Reviewed  BASIC METABOLIC PANEL - Abnormal; Notable for the following:    Glucose,  Bld 111 (*)    All other components within normal limits  CBC - Abnormal; Notable for the following:    WBC 11.5 (*)    All other components within normal limits  URINALYSIS COMPLETEWITH MICROSCOPIC ([facility] ONLY) - Abnormal; Notable for the following:    Color, Urine YELLOW (*)    APPearance CLOUDY (*)    Hgb urine dipstick 2+ (*)    Nitrite POSITIVE (*)    Leukocytes, UA 2+ (*)    Bacteria, UA MANY (*)    Squamous Epithelial / LPF 6-30 (*)    All other components within normal limits  URINE CULTURE  CULTURE, BLOOD (ROUTINE X 2)  CULTURE, BLOOD (ROUTINE X 2)  CBG MONITORING, ED   ____________________________________________  EKG  ED ECG REPORT I, Loney Hering, the attending physician, personally viewed and interpreted this ECG.   Date: 05/24/2015  EKG Time: 2015  Rate: 82  Rhythm: normal sinus rhythm  Axis: Normal  Intervals:none  ST&T Change: None  ____________________________________________  RADIOLOGY  CT head: No evidence of traumatic intracranial injury or fracture, mild cortical volume loss and scattered small vessel ischemic microangiopathy ____________________________________________   PROCEDURES  Procedure(s) performed: None  Critical Care performed: No  ____________________________________________   INITIAL IMPRESSION / ASSESSMENT AND PLAN / ED COURSE  Pertinent labs & imaging results that were available during my care of  the patient were reviewed by me and considered in my medical decision making (see chart for details).  This is a 65 year old female who comes into the hospital today with some dizziness as well as some foul-smelling urine. The patient's blood work looks unremarkable but she does have too numerous to count white blood cells as well as many bacteria in her urine. I feel that the symptoms are due to her urinary tract infection. I did give the patient dose of ceftriaxone as well as liter of normal saline. She does feel improved and she reports that she is ready to be discharged home. I will discharge the patient home with some Suprax for her UTI and have her follow-up with her primary care physician. ____________________________________________   FINAL CLINICAL IMPRESSION(S) / ED DIAGNOSES  Final diagnoses:  Dizziness  Acute cystitis without hematuria      Loney Hering, MD 05/25/15 [id number]

## 2015-05-25 NOTE — Discharge Instructions (Signed)
Dizziness °Dizziness is a common problem. It is a feeling of unsteadiness or light-headedness. You may feel like you are about to faint. Dizziness can lead to injury if you stumble or fall. Anyone can become dizzy, but dizziness is more common in older adults. This condition can be caused by a number of things, including medicines, dehydration, or illness. °HOME CARE INSTRUCTIONS °Taking these steps may help with your condition: °Eating and Drinking °· Drink enough fluid to keep your urine clear or pale yellow. This helps to keep you from becoming dehydrated. Try to drink more clear fluids, such as water. °· Do not drink alcohol. °· Limit your caffeine intake if directed by your health care provider. °· Limit your salt intake if directed by your health care provider. °Activity °· Avoid making quick movements. °· Rise slowly from chairs and steady yourself until you feel okay. °· In the morning, first sit up on the side of the bed. When you feel okay, stand slowly while you hold onto something until you know that your balance is fine. °· Move your legs often if you need to stand in one place for a long time. Tighten and relax your muscles in your legs while you are standing. °· Do not drive or operate heavy machinery if you feel dizzy. °· Avoid bending down if you feel dizzy. Place items in your home so that they are easy for you to reach without leaning over. °Lifestyle °· Do not use any tobacco products, including cigarettes, chewing tobacco, or electronic cigarettes. If you need help quitting, ask your health care provider. °· Try to reduce your stress level, such as with yoga or meditation. Talk with your health care provider if you need help. °General Instructions °· Watch your dizziness for any changes. °· Take medicines only as directed by your health care provider. Talk with your health care provider if you think that your dizziness is caused by a medicine that you are taking. °· Tell a friend or a family  member that you are feeling dizzy. If he or she notices any changes in your behavior, have this person call your health care provider. °· Keep all follow-up visits as directed by your health care provider. This is important. °SEEK MEDICAL CARE IF: °· Your dizziness does not go away. °· Your dizziness or light-headedness gets worse. °· You feel nauseous. °· You have reduced hearing. °· You have new symptoms. °· You are unsteady on your feet or you feel like the room is spinning. °SEEK IMMEDIATE MEDICAL CARE IF: °· You vomit or have diarrhea and are unable to eat or drink anything. °· You have problems talking, walking, swallowing, or using your arms, hands, or legs. °· You feel generally weak. °· You are not thinking clearly or you have trouble forming sentences. It may take a friend or family member to notice this. °· You have chest pain, abdominal pain, shortness of breath, or sweating. °· Your vision changes. °· You notice any bleeding. °· You have a headache. °· You have neck pain or a stiff neck. °· You have a fever. °  °This information is not intended to replace advice given to you by your health care provider. Make sure you discuss any questions you have with your health care provider. °  °Document Released: 08/24/2000 Document Revised: 07/15/2014 Document Reviewed: 02/24/2014 °Elsevier Interactive Patient Education ©2016 [employer] ° °Urinary Tract Infection °Urinary tract infections (UTIs) can develop anywhere along your urinary tract. Your urinary tract is   your body's drainage system for removing wastes and extra water. Your urinary tract includes two kidneys, two ureters, a bladder, and a urethra. Your kidneys are a pair of bean-shaped organs. Each kidney is about the size of your fist. They are located below your ribs, one on each side of your spine. °CAUSES °Infections are caused by microbes, which are microscopic organisms, including fungi, viruses, and bacteria. These organisms are so small that  they can only be seen through a microscope. Bacteria are the microbes that most commonly cause UTIs. °SYMPTOMS  °Symptoms of UTIs may vary by age and gender of the patient and by the location of the infection. Symptoms in young women typically include a frequent and intense urge to urinate and a painful, burning feeling in the bladder or urethra during urination. Older women and men are more likely to be tired, shaky, and weak and have muscle aches and abdominal pain. A fever may mean the infection is in your kidneys. Other symptoms of a kidney infection include pain in your back or sides below the ribs, nausea, and vomiting. °DIAGNOSIS °To diagnose a UTI, your caregiver will ask you about your symptoms. Your caregiver will also ask you to provide a urine sample. The urine sample will be tested for bacteria and white blood cells. White blood cells are made by your body to help fight infection. °TREATMENT  °Typically, UTIs can be treated with medication. Because most UTIs are caused by a bacterial infection, they usually can be treated with the use of antibiotics. The choice of antibiotic and length of treatment depend on your symptoms and the type of bacteria causing your infection. °HOME CARE INSTRUCTIONS °· If you were prescribed antibiotics, take them exactly as your caregiver instructs you. Finish the medication even if you feel better after you have only taken some of the medication. °· Drink enough water and fluids to keep your urine clear or pale yellow. °· Avoid caffeine, tea, and carbonated beverages. They tend to irritate your bladder. °· Empty your bladder often. Avoid holding urine for long periods of time. °· Empty your bladder before and after sexual intercourse. °· After a bowel movement, women should cleanse from front to back. Use each tissue only once. °SEEK MEDICAL CARE IF:  °· You have back pain. °· You develop a fever. °· Your symptoms do not begin to resolve within 3 days. °SEEK IMMEDIATE  MEDICAL CARE IF:  °· You have severe back pain or lower abdominal pain. °· You develop chills. °· You have nausea or vomiting. °· You have continued burning or discomfort with urination. °MAKE SURE YOU:  °· Understand these instructions. °· Will watch your condition. °· Will get help right away if you are not doing well or get worse. °  °This information is not intended to replace advice given to you by your health care provider. Make sure you discuss any questions you have with your health care provider. °  °Document Released: 12/08/2004 Document Revised: 11/19/2014 Document Reviewed: 04/08/2011 °Elsevier Interactive Patient Education ©2016 [employer] Inc[employer] ° °

## 2015-05-26 ENCOUNTER — Other Ambulatory Visit: Payer: Self-pay

## 2015-05-26 ENCOUNTER — Telehealth: Payer: Self-pay

## 2015-05-26 NOTE — Telephone Encounter (Signed)
Called and notified patient of Kate's comments. Patient verbalized understanding. Patient will wait for Dr Silvio Pate and finish the antibiotics.

## 2015-05-26 NOTE — Telephone Encounter (Signed)
Pt left v/m; pt was seen [facility] ED on 05/24/15 due to UTI; pt wants to know if should finish meds and then schedule OV, or wait until Dr Silvio Pate returns to office. Pt request cb.

## 2015-05-26 NOTE — Telephone Encounter (Signed)
Fax form placed in [place]. Please give back to me when done. Thanks

## 2015-05-26 NOTE — Telephone Encounter (Signed)
Either is fine, I am happy to see her for follow up, but she may wait until Dr. Silvio Pate returns if she chooses. Regardless, she needs to continue her antibiotics as prescribed.

## 2015-05-26 NOTE — Telephone Encounter (Signed)
Spoke to patient to clarify. She actually initiated the call to [facility] because she saw it on TV. She wants to try a pain cream instead of having to take pain meds all the time. Last month she had asked them to contact us about the Lidocaine. She said it was not approved through insurance. I suggested she speak to a pharmacist because she can get lidocaine 4% OTC. She will talk to a pharmacist.

## 2015-05-27 LAB — URINE CULTURE: Culture: >=100000

## 2015-05-28 DIAGNOSIS — M542 Cervicalgia: Secondary | ICD-10-CM | POA: Diagnosis not present

## 2015-05-30 LAB — CULTURE, BLOOD (ROUTINE X 2)
Culture: NO GROWTH
Culture: NO GROWTH

## 2015-06-03 ENCOUNTER — Encounter: Payer: Self-pay | Admitting: Internal Medicine

## 2015-06-03 ENCOUNTER — Ambulatory Visit (INDEPENDENT_AMBULATORY_CARE_PROVIDER_SITE_OTHER): Payer: Medicare Other | Admitting: Internal Medicine

## 2015-06-03 VITALS — BP 116/64 | HR 82 | Temp 97.2°F | Wt 132.5 lb

## 2015-06-03 DIAGNOSIS — R3915 Urgency of urination: Secondary | ICD-10-CM | POA: Diagnosis not present

## 2015-06-03 DIAGNOSIS — R42 Dizziness and giddiness: Secondary | ICD-10-CM | POA: Diagnosis not present

## 2015-06-03 DIAGNOSIS — G43009 Migraine without aura, not intractable, without status migrainosus: Secondary | ICD-10-CM

## 2015-06-03 LAB — T4, FREE: Free T4: 1.01 ng/dL (ref 0.60–1.60)

## 2015-06-03 MED ORDER — SUMATRIPTAN SUCCINATE 50 MG PO TABS: 25.0000 mg | ORAL_TABLET | Freq: Every day | ORAL | Status: DC | PRN

## 2015-06-03 NOTE — Progress Notes (Signed)
Subjective:    Patient ID: Lindsey Ward, female    DOB: 1950-10-31, 65 y.o.   MRN: [id number]    HPI Here for follow up of ER visit  Still having bladder problems--feels it won't empty No dysuria No hematuria No fever but had sweat last night  Still has trouble with balance-- has to shuffle feet in AM till she feels more stable Often has to lie back down for 1 hour and then feels better No weakness No vertigo Has some headaches-- on top of head (feels swollen). Vague and intermittent No tremor or shaking Handwriting is messy--but not smaller  Current Outpatient Prescriptions on File Prior to Visit  Medication Sig Dispense Refill  . ALPRAZolam (XANAX) 1 MG tablet TAKE 1 TABLET BY MOUTH 3 TIMES A DAY 120 tablet 0  . cholecalciferol (VITAMIN D) 1000 UNITS tablet Take 1,000 Units by mouth daily.    Marland Kitchen gabapentin (NEURONTIN) 600 MG tablet TAKE 4 TABLETS BY MOUTH AT BEDTIME 360 tablet 3  . HYDROcodone-acetaminophen (NORCO/VICODIN) 5-325 MG tablet Take 1 tablet by mouth 3 (three) times daily as needed. 90 tablet 0  . omeprazole (PRILOSEC) 20 MG capsule TAKE 1 CAPSULE BY MOUTH TWICE A DAY 180 capsule 2   No current facility-administered medications on file prior to visit.    Allergies  Allergen Reactions  . Tizanidine Other (See Comments)    "throws me in a different world"  . Aspirin     REACTION: GI bleed  . Buspirone Hcl     REACTION: unspecified  . Ciprofloxacin     REACTION: unspecified  . Codeine   . Diazepam   . Duloxetine     REACTION: sz like activity  . Ibuprofen     REACTION: GI bleed  . Oxycodone-Acetaminophen     REACTION: unspecified  . Pregabalin     REACTION: kept her up and confusion  . Propoxyphene Hcl   . Risperidone     REACTION: confusion  . Sulfonamide Derivatives     REACTION: unspecified  . Tramadol Hcl     REACTION: doesn't remember what happened but couldn't tolerate  . Venlafaxine     REACTION: unspecified    Past Medical History    Diagnosis Date  . Chronic fatigue   . Obstipation   . Depression   . Hyperlipemia   . GERD (gastroesophageal reflux disease)   . PUD (peptic ulcer disease)   . Migraine   . Osteopenia   . Bowel obstruction ([place])   . Fibromyalgia     Past Surgical History  Procedure Laterality Date  . Appendectomy  2005  . Cholecystectomy  2004  . Total abdominal hysterectomy w/ bilateral salpingoophorectomy  1986  . Abdominal surgery    . Breast biopsy Bilateral 1999    rt w/out clip - neg, lt w/clip - neg  . Breast cyst aspiration Right 2003    neg    Family History  Problem Relation Age of Onset  . Heart disease Mother     AMI  . Stroke Mother   . Heart disease Father   . Hypertension Sister   . Breast cancer Sister   . Hypertension Sister   . Heart disease Sister     MI  . Diabetes Sister     Social History   Social History  . Marital Status: Married    Spouse Name: N/A  . Number of Children: 1  . Years of Education: N/A   Occupational History  .  DISABLED    Social History Main Topics  . Smoking status: Former Smoker -- 1.00 packs/day for 40 years    Types: Cigarettes    Quit date: 09/25/2013  . Smokeless tobacco: Never Used     Comment:    . Alcohol Use: 0.0 oz/week    0 Standard drinks or equivalent per week     Comment: rarely  . Drug Use: No  . Sexual Activity: Not on file   Other Topics Concern  . Not on file   Social History Narrative   Living alone again      No living will   Daughter should make health care decisions   Requests DNR---done 10/24/14   No tube feeds if cognitively unaware   Review of Systems Nocturia x 3 No sig abdominal pain Appetite is "so-so" Reviewed note from Dr Cherrie Distance action and not much help from epidural Feels her voice is raspy    Objective:   Physical Exam  Constitutional: She is oriented to person, place, and time. She appears well-developed and well-nourished. No distress.  Neck: Normal range of motion. Neck  supple.  Abdominal: Soft. She exhibits no distension. There is no rebound and no guarding.  Mild suprapubic tenderness  Lymphadenopathy:    She has no cervical adenopathy.  Neurological: She is alert and oriented to person, place, and time. She has normal strength. She displays no tremor. No cranial nerve deficit. She exhibits normal muscle tone. She displays a negative Romberg sign. Coordination and gait normal.          Assessment & Plan:

## 2015-06-03 NOTE — Progress Notes (Signed)
Pre visit review using our clinic review tool, if applicable. No additional management support is needed unless otherwise documented below in the visit note. 

## 2015-06-03 NOTE — Assessment & Plan Note (Signed)
Current headache not clearly migraine but will give imitrex for prn use

## 2015-06-03 NOTE — Patient Instructions (Signed)
If your voice doesn't improve, set up an appointment with [facility] for them to check it.

## 2015-06-03 NOTE — Assessment & Plan Note (Signed)
Recurrent pain Feels she doesn't empty Will set up urology evaluation

## 2015-06-03 NOTE — Assessment & Plan Note (Signed)
Vague symptoms Notes foot shuffling but normal gait here and nothing else that goes along with Parkinson's CT of head was okay

## 2015-06-04 LAB — HEPATITIS C ANTIBODY: HCV Ab: NEGATIVE

## 2015-06-17 ENCOUNTER — Encounter: Payer: Self-pay | Admitting: Internal Medicine

## 2015-06-17 ENCOUNTER — Ambulatory Visit (INDEPENDENT_AMBULATORY_CARE_PROVIDER_SITE_OTHER): Payer: Medicare Other | Admitting: Internal Medicine

## 2015-06-17 VITALS — BP 110/80 | HR 55 | Temp 97.2°F | Wt 131.0 lb

## 2015-06-17 DIAGNOSIS — M5441 Lumbago with sciatica, right side: Secondary | ICD-10-CM

## 2015-06-17 DIAGNOSIS — M5442 Lumbago with sciatica, left side: Secondary | ICD-10-CM | POA: Diagnosis not present

## 2015-06-17 DIAGNOSIS — G8929 Other chronic pain: Secondary | ICD-10-CM | POA: Insufficient documentation

## 2015-06-17 DIAGNOSIS — M545 Low back pain, unspecified: Secondary | ICD-10-CM | POA: Insufficient documentation

## 2015-06-17 NOTE — Patient Instructions (Signed)
Please consider trying an inversion table and working on core strengthening.

## 2015-06-17 NOTE — Assessment & Plan Note (Signed)
Exam reassuring about discs and recent CT Has seen Dr Sharlet Salina before for neck issues I have recommended inversion table and core work. Regular walking Recommended PT but she is not excited about it

## 2015-06-17 NOTE — Progress Notes (Signed)
Pre visit review using our clinic review tool, if applicable. No additional management support is needed unless otherwise documented below in the visit note. 

## 2015-06-17 NOTE — Progress Notes (Signed)
Subjective:    Patient ID: Lindsey Ward, female    DOB: [birth year], 65 y.o.   MRN: [id number]  HPI "there is something wrong with my back"  Ongoing back pain that she feels is worse Pain starts around lumbar spine and radiates to left side and left leg Pain stops at left knee Hard sleeping some nights due to the pain---uses the hydrocodone prn (but fairly rarely) Taking gabapentin 2400 at bedtime--usually helps her sleep but some bad nights (and may take hours to "kick in")  Some leg weakness  Has tried chiropractor--- didn't help over 2 months No recent PT--but "it has never helped me"  Current Outpatient Prescriptions on File Prior to Visit  Medication Sig Dispense Refill  . ALPRAZolam (XANAX) 1 MG tablet TAKE 1 TABLET BY MOUTH 3 TIMES A DAY 120 tablet 0  . cholecalciferol (VITAMIN D) 1000 UNITS tablet Take 1,000 Units by mouth daily.    Marland Kitchen gabapentin (NEURONTIN) 600 MG tablet TAKE 4 TABLETS BY MOUTH AT BEDTIME 360 tablet 3  . HYDROcodone-acetaminophen (NORCO/VICODIN) 5-325 MG tablet Take 1 tablet by mouth 3 (three) times daily as needed. 90 tablet 0  . omeprazole (PRILOSEC) 20 MG capsule TAKE 1 CAPSULE BY MOUTH TWICE A DAY 180 capsule 2  . SUMAtriptan (IMITREX) 50 MG tablet Take 0.5-1 tablets (25-50 mg total) by mouth daily as needed for migraine. 10 tablet 0   No current facility-administered medications on file prior to visit.    Allergies  Allergen Reactions  . Tizanidine Other (See Comments)    "throws me in a different world"  . Aspirin     REACTION: GI bleed  . Buspirone Hcl     REACTION: unspecified  . Ciprofloxacin     REACTION: unspecified  . Codeine   . Diazepam   . Duloxetine     REACTION: sz like activity  . Ibuprofen     REACTION: GI bleed  . Oxycodone-Acetaminophen     REACTION: unspecified  . Pregabalin     REACTION: kept her up and confusion  . Propoxyphene Hcl   . Risperidone     REACTION: confusion  . Sulfonamide Derivatives     REACTION:  unspecified  . Tramadol Hcl     REACTION: doesn't remember what happened but couldn't tolerate  . Venlafaxine     REACTION: unspecified    Past Medical History  Diagnosis Date  . Chronic fatigue   . Obstipation   . Depression   . Hyperlipemia   . GERD (gastroesophageal reflux disease)   . PUD (peptic ulcer disease)   . Migraine   . Osteopenia   . Bowel obstruction ([place])   . Fibromyalgia     Past Surgical History  Procedure Laterality Date  . Appendectomy  2005  . Cholecystectomy  2004  . Total abdominal hysterectomy w/ bilateral salpingoophorectomy  1986  . Abdominal surgery    . Breast biopsy Bilateral 1999    rt w/out clip - neg, lt w/clip - neg  . Breast cyst aspiration Right 2003    neg    Family History  Problem Relation Age of Onset  . Heart disease Mother     AMI  . Stroke Mother   . Heart disease Father   . Hypertension Sister   . Breast cancer Sister   . Hypertension Sister   . Heart disease Sister     MI  . Diabetes Sister     Social History   Social History  .  Marital Status: Married    Spouse Name: N/A  . Number of Children: 1  . Years of Education: N/A   Occupational History  . DISABLED    Social History Main Topics  . Smoking status: Former Smoker -- 1.00 packs/day for 40 years    Types: Cigarettes    Quit date: 09/25/2013  . Smokeless tobacco: Never Used     Comment:    . Alcohol Use: 0.0 oz/week    0 Standard drinks or equivalent per week     Comment: rarely  . Drug Use: No  . Sexual Activity: Not on file   Other Topics Concern  . Not on file   Social History Narrative   Living alone again      No living will   Daughter should make health care decisions   Requests DNR---done 10/24/14   No tube feeds if cognitively unaware   Review of Systems Has not lost bladder function but does have some urgency--thinks she isn't emptying well. Going to urologist (Dr Erlene Quan) this week Appetite isn't great--no change Bowels are  slow-- tries metamucil.     Objective:   Physical Exam  Constitutional: She appears well-developed and well-nourished. No distress.  Musculoskeletal:  No spine tenderness Normal ROM of both hips SLR negative bilaterally Back flexion to almost 90 degrees  Neurological:  Gait normal Leg strength normal and symmetric          Assessment & Plan:

## 2015-06-19 ENCOUNTER — Ambulatory Visit (INDEPENDENT_AMBULATORY_CARE_PROVIDER_SITE_OTHER): Payer: Medicare Other | Admitting: Urology

## 2015-06-19 ENCOUNTER — Encounter: Payer: Self-pay | Admitting: Urology

## 2015-06-19 VITALS — BP 122/72 | HR 71 | Ht 62.0 in | Wt 133.6 lb

## 2015-06-19 DIAGNOSIS — N390 Urinary tract infection, site not specified: Secondary | ICD-10-CM | POA: Diagnosis not present

## 2015-06-19 DIAGNOSIS — K5909 Other constipation: Secondary | ICD-10-CM

## 2015-06-19 DIAGNOSIS — R351 Nocturia: Secondary | ICD-10-CM | POA: Diagnosis not present

## 2015-06-19 LAB — URINALYSIS, COMPLETE
Bilirubin, UA: NEGATIVE
Glucose, UA: NEGATIVE
Ketones, UA: NEGATIVE
Leukocytes, UA: NEGATIVE
Nitrite, UA: NEGATIVE
Protein, UA: NEGATIVE
Specific Gravity, UA: <1.005 — ABNORMAL LOW (ref 1.005–1.030)
Urobilinogen, Ur: 0.2 mg/dL (ref 0.2–1.0)
pH, UA: 6 (ref 5.0–7.5)

## 2015-06-19 LAB — MICROSCOPIC EXAMINATION: Bacteria, UA: NONE SEEN

## 2015-06-19 NOTE — Progress Notes (Signed)
06/19/2015 6:11 PM   Lindsey Ward 05-25-1950 [id number]  Referring provider: Venia Carbon, MD [place], [place] [address]  Chief Complaint  Patient presents with  . Urinary Tract Infection    HPI: 65 yo F recently seen the the ED with dizziness, falling and taken to the ED on 05/25/15 ultimately dx with a UTI.  She was treated and her symptoms resolved.  She did not have any associated urinary symptoms including no dysuria, hematuria, frequency or urgency.    She has had 2 UTIs since 01/2015 (E. Coli).  Prior to that, she has a remote history of UTIs just after she was married.    She complains of nocturia x3-4 for the past year.  Prior to 1 year ago, she did not have any nocturia.  No daytime symptoms.  She has tried cutting back on beverages which as not helped.  She does snore and she had a sleep study 6-7 years ago and did not show sleep apnea but was not snoring then.  No LE edema.    She is not sexually active.  She is s/p hysterectomy/ BSO at age 18 for endometriosis.     She has severe issues with constipation.  She reports that when sits down to urinate, she will have little "rabbit balls" fall out.  She also had some RLQ pain which she feels may be related to constipation.  She does not drink enough water.    She does have a history of atrophic vaginitis and was prescribed estrogen cream (Dr. Romelle Starcher) but has stopped taking it because she was not sure why she was using it.    No SUI.    PMH: Past Medical History  Diagnosis Date  . Chronic fatigue   . Obstipation   . Depression   . Hyperlipemia   . GERD (gastroesophageal reflux disease)   . PUD (peptic ulcer disease)   . Migraine   . Osteopenia   . Bowel obstruction ([place])   . Fibromyalgia     Surgical History: Past Surgical History  Procedure Laterality Date  . Appendectomy  2005  . Cholecystectomy  2004  . Total abdominal hysterectomy w/ bilateral salpingoophorectomy  1986  .  Abdominal surgery    . Breast biopsy Bilateral 1999    rt w/out clip - neg, lt w/clip - neg  . Breast cyst aspiration Right 2003    neg    Home Medications:    Medication List       This list is accurate as of: 06/19/15  6:11 PM.  Always use your most recent med list.               ALPRAZolam 1 MG tablet  Commonly known as:  XANAX  TAKE 1 TABLET BY MOUTH 3 TIMES A DAY     cholecalciferol 1000 units tablet  Commonly known as:  VITAMIN D  Take 1,000 Units by mouth daily.     gabapentin 600 MG tablet  Commonly known as:  NEURONTIN  TAKE 4 TABLETS BY MOUTH AT BEDTIME     HYDROcodone-acetaminophen 5-325 MG tablet  Commonly known as:  NORCO/VICODIN  Take 1 tablet by mouth 3 (three) times daily as needed.     omeprazole 20 MG capsule  Commonly known as:  PRILOSEC  TAKE 1 CAPSULE BY MOUTH TWICE A DAY     SUMAtriptan 50 MG tablet  Commonly known as:  IMITREX  Take 0.5-1 tablets (25-50 mg total)  by mouth daily as needed for migraine.        Allergies:  Allergies  Allergen Reactions  . Tizanidine Other (See Comments)    "throws me in a different world"  . Aspirin     REACTION: GI bleed  . Buspirone Hcl     REACTION: unspecified  . Ciprofloxacin     REACTION: unspecified  . Codeine   . Diazepam   . Duloxetine     REACTION: sz like activity  . Ibuprofen     REACTION: GI bleed  . Oxycodone-Acetaminophen     REACTION: unspecified  . Pregabalin     REACTION: kept her up and confusion  . Propoxyphene Hcl   . Risperidone     REACTION: confusion  . Sulfonamide Derivatives     REACTION: unspecified  . Tramadol Hcl     REACTION: doesn't remember what happened but couldn't tolerate  . Venlafaxine     REACTION: unspecified    Family History: Family History  Problem Relation Age of Onset  . Heart disease Mother     AMI  . Stroke Mother   . Heart disease Father   . Hypertension Sister   . Breast cancer Sister   . Hypertension Sister   . Heart disease Sister      MI  . Diabetes Sister   . Kidney cancer Neg Hx   . Kidney disease Neg Hx   . Prostate cancer Neg Hx     Social History:  reports that she quit smoking about 20 months ago. Her smoking use included Cigarettes. She has a 40 pack-year smoking history. She has never used smokeless tobacco. She reports that she drinks alcohol. She reports that she does not use illicit drugs.  ROS: UROLOGY Frequent Urination?: Yes Hard to postpone urination?: No Burning/pain with urination?: No Get up at night to urinate?: Yes Leakage of urine?: No Urine stream starts and stops?: No Trouble starting stream?: No Do you have to strain to urinate?: No Blood in urine?: No Urinary tract infection?: Yes Sexually transmitted disease?: No Injury to kidneys or bladder?: No Painful intercourse?: No Weak stream?: No Currently pregnant?: No Vaginal bleeding?: No Last menstrual period?: n  Gastrointestinal Nausea?: Yes Vomiting?: No Indigestion/heartburn?: Yes Diarrhea?: No Constipation?: Yes  Constitutional Fever: No Night sweats?: No Weight loss?: No Fatigue?: No  Skin Skin rash/lesions?: No Itching?: No  Eyes Blurred vision?: Yes Double vision?: No  Ears/Nose/Throat Sore throat?: No Sinus problems?: No  Hematologic/Lymphatic Swollen glands?: No Easy bruising?: No  Cardiovascular Leg swelling?: No Chest pain?: No  Respiratory Cough?: No Shortness of breath?: No  Endocrine Excessive thirst?: No  Musculoskeletal Back pain?: Yes Joint pain?: Yes  Neurological Headaches?: Yes Dizziness?: Yes  Psychologic Depression?: Yes Anxiety?: Yes  Physical Exam: BP 122/72 mmHg  Pulse 71  Ht 5\' 2"  (1.575 m)  Wt 133 lb 9.6 oz (60.601 kg)  BMI 24.43 kg/m2  Constitutional:  Alert and oriented, No acute distress. HEENT: [place] AT, moist mucus membranes.  Trachea midline, no masses. Cardiovascular: No clubbing, cyanosis, or edema. Respiratory: Normal respiratory effort, no  increased work of breathing. GI: Abdomen is soft, nontender, nondistended, no abdominal masses GU: No CVA tenderness.  Skin: No rashes, bruises or suspicious lesions. Lymph: No cervical  adenopathy. Neurologic: Grossly intact, no focal deficits, moving all 4 extremities. Psychiatric: Normal mood and affect.  Laboratory Data: Lab Results  Component Value Date   WBC 11.5* 05/24/2015   HGB 13.3 05/24/2015   HCT 38.4  05/24/2015   MCV 94.0 05/24/2015   PLT 254 05/24/2015    Lab Results  Component Value Date   CREATININE 0.88 05/24/2015     Urinalysis UA today negative   Pertinent Imaging: n/a   Assessment & Plan:    1. Recurrent UTI Recommend probiotic, increasing water intake, resuming vaginal estrogen cream, and discussed hygiene issues No need for further work up at this time given only 2 infections Will return if continues to have recurrent infections for further work up possibly including RUS/ cysto - Urinalysis, Complete  2. Nocturia Discussed possibility of underlying OSA especially in the setting of snoring and absence of daytime voiding issues Will discuss further with PCP for consideration of sleep study  3. Other constipation Discussed bowel clean out with mag citrate followed by maintenance colace bid/ metamucil/ probiotic Encouraged adequate hydration   Return if symptoms worsen or fail to improve.  Hollice Espy, MD  [facility] [address] [place], [place] [address] [phone]

## 2015-06-23 ENCOUNTER — Other Ambulatory Visit: Payer: Self-pay | Admitting: Internal Medicine

## 2015-06-23 NOTE — Telephone Encounter (Signed)
Last refill 05-15-15 #120 Last OV 06-17-15 Next OV 10-26-15

## 2015-06-24 NOTE — Telephone Encounter (Signed)
Approved: #[id number] x 0 

## 2015-06-24 NOTE — Telephone Encounter (Signed)
Left refill on voice mail at pharmacy  

## 2015-07-05 ENCOUNTER — Other Ambulatory Visit: Payer: Self-pay | Admitting: Internal Medicine

## 2015-07-15 DIAGNOSIS — L821 Other seborrheic keratosis: Secondary | ICD-10-CM | POA: Diagnosis not present

## 2015-07-15 DIAGNOSIS — L908 Other atrophic disorders of skin: Secondary | ICD-10-CM | POA: Diagnosis not present

## 2015-07-15 DIAGNOSIS — L814 Other melanin hyperpigmentation: Secondary | ICD-10-CM | POA: Diagnosis not present

## 2015-07-16 ENCOUNTER — Other Ambulatory Visit: Payer: Self-pay

## 2015-07-16 MED ORDER — HYDROCODONE-ACETAMINOPHEN 5-325 MG PO TABS: 1.0000 | ORAL_TABLET | Freq: Three times a day (TID) | ORAL | Status: DC | PRN

## 2015-07-16 NOTE — Telephone Encounter (Signed)
Spoke to pt. RX up front for pickup 

## 2015-07-16 NOTE — Telephone Encounter (Signed)
Pt left v/m requesting rx hydrocodone apap. Call when ready for pick up.rx last printed # 90 on 04/17/15; last seen on 06/17/15.

## 2015-07-27 DIAGNOSIS — H10213 Acute toxic conjunctivitis, bilateral: Secondary | ICD-10-CM | POA: Diagnosis not present

## 2015-07-27 DIAGNOSIS — H02053 Trichiasis without entropian right eye, unspecified eyelid: Secondary | ICD-10-CM | POA: Diagnosis not present

## 2015-08-05 ENCOUNTER — Other Ambulatory Visit: Payer: Self-pay | Admitting: Internal Medicine

## 2015-08-06 NOTE — Telephone Encounter (Signed)
Last filled 06-24-15 #120 Last OV Acute 06-17-15 Next OV 10-26-15

## 2015-08-06 NOTE — Telephone Encounter (Signed)
Left refill on voice mail at pharmacy  

## 2015-08-06 NOTE — Telephone Encounter (Signed)
Approved: #[id number] x 0 

## 2015-08-30 DIAGNOSIS — M5136 Other intervertebral disc degeneration, lumbar region: Secondary | ICD-10-CM | POA: Diagnosis not present

## 2015-08-30 DIAGNOSIS — M545 Low back pain: Secondary | ICD-10-CM | POA: Diagnosis not present

## 2015-09-08 ENCOUNTER — Encounter: Payer: Self-pay | Admitting: Internal Medicine

## 2015-09-08 ENCOUNTER — Ambulatory Visit (INDEPENDENT_AMBULATORY_CARE_PROVIDER_SITE_OTHER): Payer: Medicare Other | Admitting: Internal Medicine

## 2015-09-08 VITALS — BP 108/70 | HR 55 | Temp 97.4°F | Wt 130.0 lb

## 2015-09-08 DIAGNOSIS — R413 Other amnesia: Secondary | ICD-10-CM | POA: Insufficient documentation

## 2015-09-08 LAB — CBC WITH DIFFERENTIAL/PLATELET
Basophils Absolute: 0 10*3/uL (ref 0.0–0.1)
Basophils Relative: 0.6 % (ref 0.0–3.0)
Eosinophils Absolute: 0.1 10*3/uL (ref 0.0–0.7)
Eosinophils Relative: 1.8 % (ref 0.0–5.0)
HCT: 35.5 % — ABNORMAL LOW (ref 36.0–46.0)
Hemoglobin: 12 g/dL (ref 12.0–15.0)
Lymphocytes Relative: 28.7 % (ref 12.0–46.0)
Lymphs Abs: 2 10*3/uL (ref 0.7–4.0)
MCHC: 33.7 g/dL (ref 30.0–36.0)
MCV: 93.6 fl (ref 78.0–100.0)
Monocytes Absolute: 0.5 10*3/uL (ref 0.1–1.0)
Monocytes Relative: 6.8 % (ref 3.0–12.0)
Neutro Abs: 4.3 10*3/uL (ref 1.4–7.7)
Neutrophils Relative %: 62.1 % (ref 43.0–77.0)
Platelets: 269 10*3/uL (ref 150.0–400.0)
RBC: 3.79 Mil/uL — ABNORMAL LOW (ref 3.87–5.11)
RDW: 12.8 % (ref 11.5–15.5)
WBC: 7 10*3/uL (ref 4.0–10.5)

## 2015-09-08 LAB — COMPREHENSIVE METABOLIC PANEL
ALT: 15 U/L (ref 0–35)
AST: 22 U/L (ref 0–37)
Albumin: 4 g/dL (ref 3.5–5.2)
Alkaline Phosphatase: 94 U/L (ref 39–117)
BUN: 7 mg/dL (ref 6–23)
CO2: 31 mEq/L (ref 19–32)
Calcium: 9.5 mg/dL (ref 8.4–10.5)
Chloride: 104 mEq/L (ref 96–112)
Creatinine, Ser: 0.77 mg/dL (ref 0.40–1.20)
GFR: 79.46 mL/min (ref >60.00)
Glucose, Bld: 98 mg/dL (ref 70–99)
Potassium: 4.5 mEq/L (ref 3.5–5.1)
Sodium: 137 mEq/L (ref 135–145)
Total Bilirubin: 0.3 mg/dL (ref 0.2–1.2)
Total Protein: 6.8 g/dL (ref 6.0–8.3)

## 2015-09-08 LAB — T4, FREE: Free T4: 0.87 ng/dL (ref 0.60–1.60)

## 2015-09-08 LAB — VITAMIN B12: Vitamin B-12: 225 pg/mL (ref 211–911)

## 2015-09-08 MED ORDER — HYDROCODONE-ACETAMINOPHEN 5-325 MG PO TABS: 1.0000 | ORAL_TABLET | Freq: Three times a day (TID) | ORAL | Status: DC | PRN

## 2015-09-08 NOTE — Progress Notes (Signed)
Subjective:    Patient ID: JAIDON WOOLCOTT, female    DOB: 1950-03-31, 65 y.o.   MRN: [id number]  HPI Here due to memory problems Has trouble holding onto any new information Forgets things right away--even within a conversation Going on for a long time  Ongoing anxiety--may be somewhat worse 2 sisters ill now Some depression ---even some every day Ongoing since "I lost my love"--and then multiple family losses  No facial droop No aphasia No focal weakness No other focal neurologic signs  Had pain in tops of legs one morning Hard to get out of bed This is better--but still has trouble first thing in AM Some hip pain also --but not shoulders Seems to improve once she gets going   Current Outpatient Prescriptions on File Prior to Visit  Medication Sig Dispense Refill  . ALPRAZolam (XANAX) 1 MG tablet TAKE ONE TABLET BY MOUTH 3 TIMES A DAY 120 tablet 0  . cholecalciferol (VITAMIN D) 1000 UNITS tablet Take 1,000 Units by mouth daily.    Marland Kitchen gabapentin (NEURONTIN) 600 MG tablet TAKE 4 TABLETS BY MOUTH AT BEDTIME 360 tablet 3  . HYDROcodone-acetaminophen (NORCO/VICODIN) 5-325 MG tablet Take 1 tablet by mouth 3 (three) times daily as needed. 90 tablet 0  . omeprazole (PRILOSEC) 20 MG capsule TAKE 1 CAPSULE BY MOUTH TWICE A DAY 180 capsule 1  . SUMAtriptan (IMITREX) 50 MG tablet Take 0.5-1 tablets (25-50 mg total) by mouth daily as needed for migraine. 10 tablet 0   No current facility-administered medications on file prior to visit.    Allergies  Allergen Reactions  . Tizanidine Other (See Comments)    "throws me in a different world"  . Aspirin     REACTION: GI bleed  . Buspirone Hcl     REACTION: unspecified  . Ciprofloxacin     REACTION: unspecified  . Codeine   . Diazepam   . Duloxetine     REACTION: sz like activity  . Ibuprofen     REACTION: GI bleed  . Oxycodone-Acetaminophen     REACTION: unspecified  . Pregabalin     REACTION: kept her up and confusion  .  Propoxyphene Hcl   . Risperidone     REACTION: confusion  . Sulfonamide Derivatives     REACTION: unspecified  . Tramadol Hcl     REACTION: doesn't remember what happened but couldn't tolerate  . Venlafaxine     REACTION: unspecified    Past Medical History  Diagnosis Date  . Chronic fatigue   . Obstipation   . Depression   . Hyperlipemia   . GERD (gastroesophageal reflux disease)   . PUD (peptic ulcer disease)   . Migraine   . Osteopenia   . Bowel obstruction ([place])   . Fibromyalgia     Past Surgical History  Procedure Laterality Date  . Appendectomy  2005  . Cholecystectomy  2004  . Total abdominal hysterectomy w/ bilateral salpingoophorectomy  1986  . Abdominal surgery    . Breast biopsy Bilateral 1999    rt w/out clip - neg, lt w/clip - neg  . Breast cyst aspiration Right 2003    neg    Family History  Problem Relation Age of Onset  . Heart disease Mother     AMI  . Stroke Mother   . Heart disease Father   . Hypertension Sister   . Breast cancer Sister   . Hypertension Sister   . Heart disease Sister  MI  . Diabetes Sister   . Kidney cancer Neg Hx   . Kidney disease Neg Hx   . Prostate cancer Neg Hx     Social History   Social History  . Marital Status: Married    Spouse Name: N/A  . Number of Children: 1  . Years of Education: N/A   Occupational History  . DISABLED    Social History Main Topics  . Smoking status: Former Smoker -- 1.00 packs/day for 40 years    Types: Cigarettes    Quit date: 09/25/2013  . Smokeless tobacco: Never Used     Comment:    . Alcohol Use: 0.0 oz/week    0 Standard drinks or equivalent per week     Comment: rarely  . Drug Use: No  . Sexual Activity: Not on file   Other Topics Concern  . Not on file   Social History Narrative   Living alone again      No living will   Daughter should make health care decisions   Requests DNR---done 10/24/14   No tube feeds if cognitively unaware   Review of  Systems Has noticed some voice changes-- is going for evaluation at ENT. Feels like something is stuck in there at times Does have some heartburn---taking the omeprazole again (had been off it for a while)    Objective:   Physical Exam  Constitutional: She is oriented to person, place, and time. She appears well-developed and well-nourished. No distress.  HENT:  Mouth/Throat: Oropharynx is clear and moist. No oropharyngeal exudate.  Eyes: Conjunctivae and EOM are normal. Pupils are equal, round, and reactive to light.  Neck: Normal range of motion. Neck supple.  Lymphadenopathy:    She has no cervical adenopathy.  Neurological: She is alert and oriented to person, place, and time. She has normal strength. She displays no tremor. No cranial nerve deficit. She exhibits normal muscle tone. She displays a negative Romberg sign. Coordination and gait normal.  President-- "[employer]" [phone] "I can't do that math" D-l-r-o-w Recall 3/3          Assessment & Plan:

## 2015-09-08 NOTE — Assessment & Plan Note (Signed)
I believe she has pseudodementia Constant stress and anxiety Did okay on cognitive testing--but I will still check labs Reassured--probably not pathologic Also discussed that she couldn't have ADD just based on her current symptoms Discussed vacation, taking care of her own health and mind (to allow her to better handle stress)

## 2015-09-08 NOTE — Progress Notes (Signed)
Pre visit review using our clinic review tool, if applicable. No additional management support is needed unless otherwise documented below in the visit note. 

## 2015-09-10 DIAGNOSIS — K219 Gastro-esophageal reflux disease without esophagitis: Secondary | ICD-10-CM | POA: Diagnosis not present

## 2015-09-10 DIAGNOSIS — R490 Dysphonia: Secondary | ICD-10-CM | POA: Diagnosis not present

## 2015-09-21 ENCOUNTER — Encounter: Payer: Self-pay | Admitting: Internal Medicine

## 2015-09-21 ENCOUNTER — Telehealth: Payer: Self-pay | Admitting: Internal Medicine

## 2015-09-21 ENCOUNTER — Ambulatory Visit (INDEPENDENT_AMBULATORY_CARE_PROVIDER_SITE_OTHER): Payer: Medicare Other | Admitting: Internal Medicine

## 2015-09-21 VITALS — BP 130/82 | HR 67 | Temp 97.7°F | Wt 130.0 lb

## 2015-09-21 DIAGNOSIS — K219 Gastro-esophageal reflux disease without esophagitis: Secondary | ICD-10-CM | POA: Insufficient documentation

## 2015-09-21 DIAGNOSIS — J387 Other diseases of larynx: Secondary | ICD-10-CM | POA: Diagnosis not present

## 2015-09-21 NOTE — Telephone Encounter (Signed)
[facility]  Patient Name: Lindsey Ward  DOB: [birth year]    Initial Comment Caller States she has swelling in her throat, hard to swallow, nasal congestion   Nurse Assessment  Nurse: Wynetta Emery, RN, Baker Janus Date/Time (Eastern Time): 09/21/2015 7:20:14 AM  Confirm and document reason for call. If symptomatic, describe symptoms. You must click the next button to save text entered. ---[name] has a sore throat hard to swallow and nasal congestion onset 3 weeks  Has the patient traveled out of the country within the last 30 days? ---No  Does the patient have any new or worsening symptoms? ---Yes  Will a triage be completed? ---Yes  Related visit to physician within the last 2 weeks? ---No  Does the PT have any chronic conditions? (i.e. diabetes, asthma, etc.) ---Unknown  Is this a behavioral health or substance abuse call? ---No     Guidelines    Guideline Title Affirmed Question Affirmed Notes  Sore Throat SEVERE (e.g., excruciating) throat pain    Final Disposition User   See Physician within 24 Hours Wynetta Emery, RN, Baker Janus    Comments  NOTE Made an appointment with Dr. Viviana Simpler on 09-21-2015 Monday today 1115am for c/o sore throat hard to swallow and nasal congestion for [name]   Referrals  REFERRED TO PCP OFFICE   Disagree/Comply: Leta Baptist

## 2015-09-21 NOTE — Patient Instructions (Signed)
Please keep your head of the bed elevated. Don't eat within 2 hours of lying down in bed. Take the omeprazole twice a day for now. If you are better in the next couple of weeks, you can decrease to once a day after 6 weeks. You should continue daily for at least a few months--then you can try going down to every other day (but you shouldn't stop).

## 2015-09-21 NOTE — Assessment & Plan Note (Signed)
No indication of respiratory infection Had gone off PPI---now has restarted Counseled on avoidance measures Stay on PPI for now--see instructions

## 2015-09-21 NOTE — Progress Notes (Signed)
Pre visit review using our clinic review tool, if applicable. No additional management support is needed unless otherwise documented below in the visit note. 

## 2015-09-21 NOTE — Telephone Encounter (Signed)
Will see then. 

## 2015-09-21 NOTE — Progress Notes (Signed)
Subjective:    Patient ID: Lindsey Ward, female    DOB: Jun 18, 1950, 65 y.o.   MRN: [id number]  HPI Here due to respiratory symptoms Having throat symptoms--- feels it closing during swallowing Hard to blow nose--feels it "stopped up" Going on for a long time--weeks. Did see ENT and didn't make diagnosis Went to walk in clinic yesterday--because she couldn't talk or swallow No testing or treatment done  No cough No SOB No fever No apparent post nasal drip No ear pain  Takes fexofenadine daily Not sure if this helps No other meds for this---no gargles, etc either  Current Outpatient Prescriptions on File Prior to Visit  Medication Sig Dispense Refill  . ALPRAZolam (XANAX) 1 MG tablet TAKE ONE TABLET BY MOUTH 3 TIMES A DAY 120 tablet 0  . gabapentin (NEURONTIN) 600 MG tablet TAKE 4 TABLETS BY MOUTH AT BEDTIME 360 tablet 3  . HYDROcodone-acetaminophen (NORCO/VICODIN) 5-325 MG tablet Take 1 tablet by mouth 3 (three) times daily as needed. 90 tablet 0  . omeprazole (PRILOSEC) 20 MG capsule TAKE 1 CAPSULE BY MOUTH TWICE A DAY 180 capsule 1  . SUMAtriptan (IMITREX) 50 MG tablet Take 0.5-1 tablets (25-50 mg total) by mouth daily as needed for migraine. 10 tablet 0  . cholecalciferol (VITAMIN D) 1000 UNITS tablet Take 1,000 Units by mouth daily. Reported on 09/21/2015     No current facility-administered medications on file prior to visit.    Allergies  Allergen Reactions  . Tizanidine Other (See Comments)    "throws me in a different world"  . Aspirin     REACTION: GI bleed  . Buspirone Hcl     REACTION: unspecified  . Ciprofloxacin     REACTION: unspecified  . Codeine   . Diazepam   . Duloxetine     REACTION: sz like activity  . Ibuprofen     REACTION: GI bleed  . Oxycodone-Acetaminophen     REACTION: unspecified  . Pregabalin     REACTION: kept her up and confusion  . Propoxyphene Hcl   . Risperidone     REACTION: confusion  . Sulfonamide Derivatives    REACTION: unspecified  . Tramadol Hcl     REACTION: doesn't remember what happened but couldn't tolerate  . Venlafaxine     REACTION: unspecified    Past Medical History  Diagnosis Date  . Chronic fatigue   . Obstipation   . Depression   . Hyperlipemia   . GERD (gastroesophageal reflux disease)   . PUD (peptic ulcer disease)   . Migraine   . Osteopenia   . Bowel obstruction ([place])   . Fibromyalgia     Past Surgical History  Procedure Laterality Date  . Appendectomy  2005  . Cholecystectomy  2004  . Total abdominal hysterectomy w/ bilateral salpingoophorectomy  1986  . Abdominal surgery    . Breast biopsy Bilateral 1999    rt w/out clip - neg, lt w/clip - neg  . Breast cyst aspiration Right 2003    neg    Family History  Problem Relation Age of Onset  . Heart disease Mother     AMI  . Stroke Mother   . Heart disease Father   . Hypertension Sister   . Breast cancer Sister   . Hypertension Sister   . Heart disease Sister     MI  . Diabetes Sister   . Kidney cancer Neg Hx   . Kidney disease Neg Hx   . Prostate  cancer Neg Hx     Social History   Social History  . Marital Status: Married    Spouse Name: N/A  . Number of Children: 1  . Years of Education: N/A   Occupational History  . DISABLED    Social History Main Topics  . Smoking status: Former Smoker -- 1.00 packs/day for 40 years    Types: Cigarettes    Quit date: 09/25/2013  . Smokeless tobacco: Never Used     Comment:    . Alcohol Use: 0.0 oz/week    0 Standard drinks or equivalent per week     Comment: rarely  . Drug Use: No  . Sexual Activity: Not on file   Other Topics Concern  . Not on file   Social History Narrative   Living alone again      No living will   Daughter should make health care decisions   Requests DNR---done 10/24/14   No tube feeds if cognitively unaware   Review of Systems Heartburn is not there Restarted omeprazole 2-3 days ago    Objective:   Physical Exam    Constitutional: She appears well-developed and well-nourished. No distress.  HENT:  Mouth/Throat: Oropharynx is clear and moist. No oropharyngeal exudate.  Slight nasal congestion  Neck: Normal range of motion. Neck supple.  Pulmonary/Chest: Effort normal and breath sounds normal. No respiratory distress. She has no wheezes. She has no rales.  Abdominal: Soft. There is no tenderness.  Musculoskeletal: She exhibits no edema.  Lymphadenopathy:    She has no cervical adenopathy.          Assessment & Plan:

## 2015-09-21 NOTE — Telephone Encounter (Signed)
Pt has appt 09/21/15 at 11:15 with Dr Silvio Pate.

## 2015-09-26 ENCOUNTER — Emergency Department
Admission: EM | Admit: 2015-09-26 | Discharge: 2015-09-26 | Disposition: A | Discharge status: Home / Self Care | Payer: Medicare Other | Attending: Emergency Medicine | Admitting: Emergency Medicine

## 2015-09-26 ENCOUNTER — Encounter: Payer: Self-pay | Admitting: Emergency Medicine

## 2015-09-26 DIAGNOSIS — Z5321 Procedure and treatment not carried out due to patient leaving prior to being seen by health care provider: Secondary | ICD-10-CM | POA: Diagnosis not present

## 2015-09-26 DIAGNOSIS — H5713 Ocular pain, bilateral: Secondary | ICD-10-CM | POA: Diagnosis not present

## 2015-09-26 DIAGNOSIS — Z87891 Personal history of nicotine dependence: Secondary | ICD-10-CM | POA: Diagnosis not present

## 2015-09-26 DIAGNOSIS — H02059 Trichiasis without entropian unspecified eye, unspecified eyelid: Secondary | ICD-10-CM | POA: Diagnosis not present

## 2015-09-26 DIAGNOSIS — F329 Major depressive disorder, single episode, unspecified: Secondary | ICD-10-CM | POA: Diagnosis not present

## 2015-09-26 DIAGNOSIS — E785 Hyperlipidemia, unspecified: Secondary | ICD-10-CM | POA: Diagnosis not present

## 2015-09-26 NOTE — ED Notes (Signed)
Developed bilateral eye pain yesterday denies any injury

## 2015-10-01 ENCOUNTER — Ambulatory Visit (INDEPENDENT_AMBULATORY_CARE_PROVIDER_SITE_OTHER): Payer: Medicare Other

## 2015-10-01 ENCOUNTER — Telehealth: Payer: Self-pay

## 2015-10-01 VITALS — BP 120/73 | HR 71 | Temp 97.2°F | Wt 132.4 lb

## 2015-10-01 DIAGNOSIS — N390 Urinary tract infection, site not specified: Secondary | ICD-10-CM

## 2015-10-01 LAB — MICROSCOPIC EXAMINATION: WBC, UA: >30 /hpf — AB (ref 0–?)

## 2015-10-01 LAB — URINALYSIS, COMPLETE
Bilirubin, UA: NEGATIVE
Glucose, UA: NEGATIVE
Ketones, UA: NEGATIVE
Nitrite, UA: NEGATIVE
Protein, UA: NEGATIVE
Specific Gravity, UA: 1.01 (ref 1.005–1.030)
Urobilinogen, Ur: 0.2 mg/dL (ref 0.2–1.0)
pH, UA: 6.5 (ref 5.0–7.5)

## 2015-10-01 NOTE — Progress Notes (Signed)
Pt came in today with c/o foul smelling urine and a slight twinge upon urination. Pt denied n/v, f/c, lower abd pain. Pt VS- 132.4lbs, 120/73, 71, 97.2. Pt gave a clean catch specimen to be sent for u/a and cx. Reinforced with pt should fever 102+ go straight to the ER. Pt voiced understanding.

## 2015-10-01 NOTE — Telephone Encounter (Signed)
Pt called stating she has foul smelling urine and thinks she has a UTI. Pt has been added to nurse schedule.

## 2015-10-02 ENCOUNTER — Other Ambulatory Visit: Payer: Self-pay | Admitting: Internal Medicine

## 2015-10-02 NOTE — Telephone Encounter (Signed)
Ok to phone in Xanax 

## 2015-10-02 NOTE — Telephone Encounter (Signed)
Last filled 08-06-15 #120 Last OV Acute 09-21-15 Next OV 10-26-15. Forwarding to [facility] in Dr Alla German absence

## 2015-10-02 NOTE — Telephone Encounter (Signed)
Left refill on voice mail at pharmacy  

## 2015-10-04 ENCOUNTER — Telehealth: Payer: Self-pay | Admitting: Urology

## 2015-10-04 LAB — CULTURE, URINE COMPREHENSIVE

## 2015-10-04 MED ORDER — AMOXICILLIN-POT CLAVULANATE 875-125 MG PO TABS
1.0000 | ORAL_TABLET | Freq: Two times a day (BID) | ORAL | 0 refills | Status: DC
Start: 2015-10-04 — End: 2015-11-13

## 2015-10-04 NOTE — Telephone Encounter (Signed)
UTI, treat with Augmentin x 3 days.  Hollice Espy, MD

## 2015-10-05 ENCOUNTER — Other Ambulatory Visit: Payer: Self-pay | Admitting: Internal Medicine

## 2015-10-05 ENCOUNTER — Telehealth: Payer: Self-pay

## 2015-10-05 NOTE — Telephone Encounter (Signed)
-----   Message from Hollice Espy, MD sent at 10/05/2015  7:37 AM EDT ----- Called patient over the weekend, unable to reach, [facility].  Please contact her and ensure she picked up her abx.    Hollice Espy, MD

## 2015-10-05 NOTE — Telephone Encounter (Signed)
LMOM

## 2015-10-06 NOTE — Telephone Encounter (Signed)
Spoke with pt in reference to abx. Pt stated she did get the medication and has started it.

## 2015-10-22 ENCOUNTER — Encounter: Payer: Medicare Other | Admitting: Internal Medicine

## 2015-10-26 ENCOUNTER — Encounter: Payer: Medicare Other | Admitting: Internal Medicine

## 2015-10-29 ENCOUNTER — Telehealth: Payer: Self-pay | Admitting: Internal Medicine

## 2015-10-29 NOTE — Telephone Encounter (Signed)
Pt called to request a refill of her Hydroconde.  She uses [facility] on [address].  Thanks!

## 2015-10-30 MED ORDER — HYDROCODONE-ACETAMINOPHEN 5-325 MG PO TABS: 1.0000 | ORAL_TABLET | Freq: Three times a day (TID) | ORAL | 0 refills | Status: DC | PRN

## 2015-10-30 NOTE — Telephone Encounter (Signed)
rx placed at front desk ready for pick up left message for pt to return my call.

## 2015-11-03 ENCOUNTER — Other Ambulatory Visit: Payer: Self-pay | Admitting: Internal Medicine

## 2015-11-03 NOTE — Telephone Encounter (Signed)
Last filled 10-05-15 #120 Last OV Acute 09-21-15 Next OV 03-08-16

## 2015-11-04 NOTE — Telephone Encounter (Signed)
Approved: #[id number] x 0 

## 2015-11-04 NOTE — Telephone Encounter (Signed)
Left refill on voice mail at pharmacy  

## 2015-11-05 DIAGNOSIS — H02059 Trichiasis without entropian unspecified eye, unspecified eyelid: Secondary | ICD-10-CM | POA: Diagnosis not present

## 2015-11-13 ENCOUNTER — Ambulatory Visit (INDEPENDENT_AMBULATORY_CARE_PROVIDER_SITE_OTHER)
Admission: RE | Admit: 2015-11-13 | Discharge: 2015-11-13 | Disposition: A | Discharge status: Home / Self Care | Payer: Medicare Other | Source: Ambulatory Visit | Attending: Family Medicine | Admitting: Family Medicine

## 2015-11-13 ENCOUNTER — Encounter: Payer: Self-pay | Admitting: Family Medicine

## 2015-11-13 ENCOUNTER — Ambulatory Visit (INDEPENDENT_AMBULATORY_CARE_PROVIDER_SITE_OTHER): Payer: Medicare Other | Admitting: Family Medicine

## 2015-11-13 VITALS — BP 122/64 | HR 67 | Temp 97.3°F | Wt 131.0 lb

## 2015-11-13 DIAGNOSIS — S8991XA Unspecified injury of right lower leg, initial encounter: Secondary | ICD-10-CM

## 2015-11-13 DIAGNOSIS — M25461 Effusion, right knee: Secondary | ICD-10-CM

## 2015-11-13 DIAGNOSIS — M25561 Pain in right knee: Secondary | ICD-10-CM

## 2015-11-13 NOTE — Progress Notes (Signed)
Subjective:    Patient ID: Lindsey Ward, female    DOB: 1950/10/03, 65 y.o.   MRN: [id number]  HPI This is a 65 yo female who presents today with right knee pain. She fell backwards from a ladder about 2 weeks ago. Not sure how she landed, but has had swelling of knee. She hears intermittent cracking and has daily pain. Not interfering with sleep. Doesn't feel weak, no giving way. Takes oxycontin about every other day for fibromyalgia pain. Is unable to take NSAIDs due to prior GI bleed.  Recently moved and has not been resting knee. Has worn a knee immobilizer with some relief.   Past Medical History:  Diagnosis Date  . Bowel obstruction ([place])   . Chronic fatigue   . Depression   . Fibromyalgia   . GERD (gastroesophageal reflux disease)   . Hyperlipemia   . Migraine   . Obstipation   . Osteopenia   . PUD (peptic ulcer disease)    Past Surgical History:  Procedure Laterality Date  . ABDOMINAL SURGERY    . APPENDECTOMY  2005  . BREAST BIOPSY Bilateral 1999   rt w/out clip - neg, lt w/clip - neg  . BREAST CYST ASPIRATION Right 2003   neg  . CHOLECYSTECTOMY  2004  . TOTAL ABDOMINAL HYSTERECTOMY W/ BILATERAL SALPINGOOPHORECTOMY  1986   Family History  Problem Relation Age of Onset  . Heart disease Mother     AMI  . Stroke Mother   . Heart disease Father   . Hypertension Sister   . Breast cancer Sister   . Hypertension Sister   . Heart disease Sister     MI  . Diabetes Sister   . Kidney cancer Neg Hx   . Kidney disease Neg Hx   . Prostate cancer Neg Hx    Social History  Substance Use Topics  . Smoking status: Former Smoker    Packs/day: 1.00    Years: 40.00    Types: Cigarettes    Quit date: 09/25/2013  . Smokeless tobacco: Never Used     Comment:    . Alcohol use 0.0 oz/week     Comment: rarely      Review of Systems Per HPI    Objective:   Physical Exam  Constitutional: She is oriented to person, place, and time. She appears well-developed and  well-nourished. No distress.  HENT:  Head: Normocephalic and atraumatic.  Cardiovascular: Normal rate.   Pulmonary/Chest: Effort normal.  Musculoskeletal:       Right knee: She exhibits swelling (lateral patella.). She exhibits normal range of motion, no erythema, normal alignment, no LCL laxity and normal patellar mobility. Tenderness found. Lateral joint line tenderness noted.  Neurological: She is alert and oriented to person, place, and time.  Skin: Skin is warm and dry. She is not diaphoretic.  Psychiatric: She has a normal mood and affect. Her behavior is normal. Judgment and thought content normal.  Vitals reviewed.     BP 122/64   Pulse 67   Temp 97.3 F (36.3 C) (Oral)   Wt 131 lb (59.4 kg)   SpO2 97%   BMI 23.21 kg/m  Wt Readings from Last 3 Encounters:  11/13/15 131 lb (59.4 kg)  10/01/15 132 lb 6.4 oz (60.1 kg)  09/26/15 130 lb (59 kg)   Dg Knee Complete 4 Views Right  Result Date: 11/13/2015 CLINICAL DATA:  RIGHT knee injury, pain and swelling. EXAM: RIGHT KNEE - COMPLETE 4+ VIEW COMPARISON:  None. FINDINGS: No evidence of fracture, dislocation, or joint effusion. No evidence of advanced arthropathy or other focal bone abnormality. Small suprapatellar joint effusion. IMPRESSION: Small suprapatellar joint effusion. No acute fracture deformity or dislocation. Electronically Signed   By: Elon Alas M.D.   On: 11/13/2015 15:52       Assessment & Plan:  1. Right knee pain - DG Knee Complete 4 Views Right; Future - Xray shows small suprapatellar effusion,no acute fracture, dislocation or deformity- discussed results with patient - she can not take NSAIDs, can take acetaminophen, she has a knee immobilizer she can wear for comfort - if no improvement in 2 weeks, suggested she see Dr. Lorelei Pont.  2. Right knee injury, initial encounter - DG Knee Complete 4 Views Right; Future  3. Knee swelling, right - DG Knee Complete 4 Views Right; Future   Clarene Reamer,  FNP-BC  [facility]  11/13/2015 4:12 PM

## 2015-11-13 NOTE — Progress Notes (Signed)
Pre visit review using our clinic review tool, if applicable. No additional management support is needed unless otherwise documented below in the visit note. 

## 2015-12-02 DIAGNOSIS — M3501 Sicca syndrome with keratoconjunctivitis: Secondary | ICD-10-CM | POA: Diagnosis not present

## 2015-12-15 ENCOUNTER — Ambulatory Visit (INDEPENDENT_AMBULATORY_CARE_PROVIDER_SITE_OTHER): Payer: Medicare Other

## 2015-12-15 DIAGNOSIS — Z23 Encounter for immunization: Secondary | ICD-10-CM | POA: Diagnosis not present

## 2015-12-24 ENCOUNTER — Encounter: Payer: Self-pay | Admitting: Internal Medicine

## 2015-12-24 ENCOUNTER — Ambulatory Visit (INDEPENDENT_AMBULATORY_CARE_PROVIDER_SITE_OTHER): Payer: Medicare Other | Admitting: Internal Medicine

## 2015-12-24 ENCOUNTER — Other Ambulatory Visit: Payer: Self-pay | Admitting: Internal Medicine

## 2015-12-24 VITALS — BP 118/82 | HR 48 | Temp 97.4°F | Wt 129.0 lb

## 2015-12-24 DIAGNOSIS — M791 Myalgia, unspecified site: Secondary | ICD-10-CM

## 2015-12-24 MED ORDER — SUMATRIPTAN SUCCINATE 50 MG PO TABS: 25.0000 mg | ORAL_TABLET | Freq: Every day | ORAL | 5 refills | Status: DC | PRN

## 2015-12-24 MED ORDER — ALPRAZOLAM 1 MG PO TABS: ORAL_TABLET | ORAL | 0 refills | Status: DC

## 2015-12-24 NOTE — Patient Instructions (Signed)
You can take tylenol --- 2 extra strength tabs --- up to three times a day, till the pain eases up.

## 2015-12-24 NOTE — Assessment & Plan Note (Signed)
Doesn't seem to be joint related Has vague headache that seems more migraine like--some temporal sensitivity (so will check sed rate--though doubt arteritis). If very high though, will start prednisone and get biopsy. Muscle pain could be PMR--but I suspect more of an acute reaction to the flu vaccine Discussed regular tylenol Check labs

## 2015-12-24 NOTE — Progress Notes (Signed)
Pre visit review using our clinic review tool, if applicable. No additional management support is needed unless otherwise documented below in the visit note. 

## 2015-12-24 NOTE — Progress Notes (Signed)
Subjective:    Patient ID: Lindsey Ward, female    DOB: 01/30/1951, 65 y.o.   MRN: [id number]  HPI Here due to having generalized pain Has been in bed for 4 days Pain from her neck all the way down back to buttocks Feels it in thighs Arms also--especially forearms She feels these pains have been there--just worse now  Doesn't seem to have joint pain or swelling No cough or SOB  Has tried the hydrocodone--this isn't helping much  Current Outpatient Prescriptions on File Prior to Visit  Medication Sig Dispense Refill  . ALPRAZolam (XANAX) 1 MG tablet TAKE ONE TABLET BY MOUTH 3 TIMES A DAY AS NEEDED 120 tablet 0  . gabapentin (NEURONTIN) 600 MG tablet TAKE 4 TABLETS BY MOUTH AT BEDTIME 360 tablet 3  . HYDROcodone-acetaminophen (NORCO/VICODIN) 5-325 MG tablet Take 1 tablet by mouth 3 (three) times daily as needed. 90 tablet 0  . omeprazole (PRILOSEC) 20 MG capsule TAKE 1 CAPSULE BY MOUTH TWICE A DAY 180 capsule 1  . SUMAtriptan (IMITREX) 50 MG tablet Take 0.5-1 tablets (25-50 mg total) by mouth daily as needed for migraine. 10 tablet 0  . cholecalciferol (VITAMIN D) 1000 UNITS tablet Take 1,000 Units by mouth daily. Reported on 09/21/2015     No current facility-administered medications on file prior to visit.     Allergies  Allergen Reactions  . Tizanidine Other (See Comments)    "throws me in a different world"  . Aspirin     REACTION: GI bleed  . Buspirone Hcl     REACTION: unspecified  . Ciprofloxacin     REACTION: unspecified  . Codeine   . Diazepam   . Duloxetine     REACTION: sz like activity  . Ibuprofen     REACTION: GI bleed  . Oxycodone-Acetaminophen     REACTION: unspecified  . Pregabalin     REACTION: kept her up and confusion  . Propoxyphene Hcl   . Risperidone     REACTION: confusion  . Sulfonamide Derivatives     REACTION: unspecified  . Tramadol Hcl     REACTION: doesn't remember what happened but couldn't tolerate  . Venlafaxine     REACTION:  unspecified    Past Medical History:  Diagnosis Date  . Bowel obstruction   . Chronic fatigue   . Depression   . Fibromyalgia   . GERD (gastroesophageal reflux disease)   . Hyperlipemia   . Migraine   . Obstipation   . Osteopenia   . PUD (peptic ulcer disease)     Past Surgical History:  Procedure Laterality Date  . ABDOMINAL SURGERY    . APPENDECTOMY  2005  . BREAST BIOPSY Bilateral 1999   rt w/out clip - neg, lt w/clip - neg  . BREAST CYST ASPIRATION Right 2003   neg  . CHOLECYSTECTOMY  2004  . TOTAL ABDOMINAL HYSTERECTOMY W/ BILATERAL SALPINGOOPHORECTOMY  1986    Family History  Problem Relation Age of Onset  . Heart disease Mother     AMI  . Stroke Mother   . Heart disease Father   . Hypertension Sister   . Breast cancer Sister   . Hypertension Sister   . Heart disease Sister     MI  . Diabetes Sister   . Kidney cancer Neg Hx   . Kidney disease Neg Hx   . Prostate cancer Neg Hx     Social History   Social History  . Marital status: Married  Spouse name: N/A  . Number of children: 1  . Years of education: N/A   Occupational History  . DISABLED    Social History Main Topics  . Smoking status: Former Smoker    Packs/day: 1.00    Years: 40.00    Types: Cigarettes    Quit date: 09/25/2013  . Smokeless tobacco: Never Used     Comment:    . Alcohol use 0.0 oz/week     Comment: rarely  . Drug use: No  . Sexual activity: Not on file   Other Topics Concern  . Not on file   Social History Narrative   Living alone again      No living will   Daughter should make health care decisions   Requests DNR---done 10/24/14   No tube feeds if cognitively unaware    Review of Systems No fever Got flu shot last week-- 10/5 No overt muscle weakness Having some migraines---also very sensitive in scalp in let temple. The headache is on top though No pain with chewing--eating okay No loss of vision    Objective:   Physical Exam  HENT:    Mouth/Throat: Oropharynx is clear and moist. No oropharyngeal exudate.  No temporal sensitivity or bruits No TMJ findings   Neck: Normal range of motion. Neck supple. No thyromegaly present.  Cardiovascular: Normal rate, regular rhythm and normal heart sounds.  Exam reveals no gallop.   No murmur heard. Pulmonary/Chest: Effort normal and breath sounds normal. No respiratory distress. She has no wheezes. She has no rales.  Musculoskeletal:  No joint swelling or tenderness No clear muscle tenderness  Lymphadenopathy:    She has no cervical adenopathy.  Psychiatric: She has a normal mood and affect. Her behavior is normal.          Assessment & Plan:

## 2015-12-25 LAB — COMPREHENSIVE METABOLIC PANEL
ALT: 12 U/L (ref 0–35)
AST: 24 U/L (ref 0–37)
Albumin: 4.7 g/dL (ref 3.5–5.2)
Alkaline Phosphatase: 97 U/L (ref 39–117)
BUN: 11 mg/dL (ref 6–23)
CO2: 30 mEq/L (ref 19–32)
Calcium: 10.2 mg/dL (ref 8.4–10.5)
Chloride: 104 mEq/L (ref 96–112)
Creatinine, Ser: 0.74 mg/dL (ref 0.40–1.20)
GFR: 83.12 mL/min (ref >60.00)
Glucose, Bld: 75 mg/dL (ref 70–99)
Potassium: 4.5 mEq/L (ref 3.5–5.1)
Sodium: 142 mEq/L (ref 135–145)
Total Bilirubin: 0.3 mg/dL (ref 0.2–1.2)
Total Protein: 7.8 g/dL (ref 6.0–8.3)

## 2015-12-25 LAB — CBC WITH DIFFERENTIAL/PLATELET
Basophils Absolute: 0 10*3/uL (ref 0.0–0.1)
Basophils Relative: 0.6 % (ref 0.0–3.0)
Eosinophils Absolute: 0.1 10*3/uL (ref 0.0–0.7)
Eosinophils Relative: 1.5 % (ref 0.0–5.0)
HCT: 36.8 % (ref 36.0–46.0)
Hemoglobin: 12.4 g/dL (ref 12.0–15.0)
Lymphocytes Relative: 26.1 % (ref 12.0–46.0)
Lymphs Abs: 2.1 10*3/uL (ref 0.7–4.0)
MCHC: 33.7 g/dL (ref 30.0–36.0)
MCV: 88.5 fl (ref 78.0–100.0)
Monocytes Absolute: 0.1 10*3/uL (ref 0.1–1.0)
Monocytes Relative: 1.7 % — ABNORMAL LOW (ref 3.0–12.0)
Neutro Abs: 5.5 10*3/uL (ref 1.4–7.7)
Neutrophils Relative %: 70.1 % (ref 43.0–77.0)
Platelets: 292 10*3/uL (ref 150.0–400.0)
RBC: 4.16 Mil/uL (ref 3.87–5.11)
RDW: 13.1 % (ref 11.5–15.5)
WBC: 7.9 10*3/uL (ref 4.0–10.5)

## 2015-12-25 LAB — CK: Total CK: 88 U/L (ref 7–177)

## 2015-12-25 LAB — SEDIMENTATION RATE: Sed Rate: 16 mm/hr (ref 0–30)

## 2015-12-30 DIAGNOSIS — H35342 Macular cyst, hole, or pseudohole, left eye: Secondary | ICD-10-CM | POA: Diagnosis not present

## 2015-12-30 DIAGNOSIS — H02059 Trichiasis without entropian unspecified eye, unspecified eyelid: Secondary | ICD-10-CM | POA: Diagnosis not present

## 2016-01-04 ENCOUNTER — Other Ambulatory Visit: Payer: Self-pay

## 2016-01-04 MED ORDER — HYDROCODONE-ACETAMINOPHEN 5-325 MG PO TABS: 1.0000 | ORAL_TABLET | Freq: Three times a day (TID) | ORAL | 0 refills | Status: DC | PRN

## 2016-01-04 NOTE — Telephone Encounter (Signed)
Spoke to pt. Rx up front to pickup

## 2016-01-04 NOTE — Telephone Encounter (Signed)
Pt left v/m requesting rx hydrocodone apap. Call when ready for pick up. rx last printed # 90 on 10/30/15. Last seen 12/24/15

## 2016-01-12 ENCOUNTER — Other Ambulatory Visit: Payer: Self-pay | Admitting: Internal Medicine

## 2016-01-12 DIAGNOSIS — Z1231 Encounter for screening mammogram for malignant neoplasm of breast: Secondary | ICD-10-CM

## 2016-02-14 ENCOUNTER — Other Ambulatory Visit: Payer: Self-pay | Admitting: Internal Medicine

## 2016-02-15 NOTE — Telephone Encounter (Signed)
Last office visit 12/24/2015.  Last refilled 12/24/2015 for #120 with no refills.

## 2016-02-15 NOTE — Telephone Encounter (Signed)
Alprazolam called into [facility] [address] 

## 2016-02-15 NOTE — Telephone Encounter (Signed)
Approved: #[id number] x 0 

## 2016-02-19 ENCOUNTER — Ambulatory Visit: Payer: Medicare Other | Admitting: Urology

## 2016-02-19 ENCOUNTER — Ambulatory Visit: Payer: Medicare Other

## 2016-02-24 ENCOUNTER — Ambulatory Visit: Payer: Medicare Other | Admitting: Urology

## 2016-02-24 DIAGNOSIS — R110 Nausea: Secondary | ICD-10-CM | POA: Diagnosis not present

## 2016-02-24 DIAGNOSIS — R0789 Other chest pain: Secondary | ICD-10-CM | POA: Diagnosis not present

## 2016-02-24 DIAGNOSIS — R05 Cough: Secondary | ICD-10-CM | POA: Diagnosis not present

## 2016-02-24 DIAGNOSIS — J309 Allergic rhinitis, unspecified: Secondary | ICD-10-CM | POA: Diagnosis not present

## 2016-02-26 DIAGNOSIS — L821 Other seborrheic keratosis: Secondary | ICD-10-CM | POA: Diagnosis not present

## 2016-03-02 ENCOUNTER — Ambulatory Visit: Payer: Medicare Other | Admitting: Urology

## 2016-03-02 ENCOUNTER — Encounter: Payer: Self-pay | Admitting: Urology

## 2016-03-02 NOTE — Progress Notes (Deleted)
03/02/2016 1:46 PM   Lindsey Ward 03/16/1950 [id number]  Referring provider: Venia Carbon, MD [place], [place] [address]  No chief complaint on file.   HPI: Patient is a 65 year old Caucasian female who presents today for an urgent appointment due to UTI symptoms and the feeling of incomplete bladder emptying.    Background history 65 yo F recently seen the the ED with dizziness, falling and taken to the ED on 05/25/15 ultimately dx with a UTI.  She was treated and her symptoms resolved.  She did not have any associated urinary symptoms including no dysuria, hematuria, frequency or urgency.   She has had 2 UTIs since 01/2015 (E. Coli).  Prior to that, she has a remote history of UTIs just after she was married.   She complains of nocturia x3-4 for the past year.  Prior to 1 year ago, she did not have any nocturia.  No daytime symptoms.  She has tried cutting back on beverages which as not helped.  She does snore and she had a sleep study 6-7 years ago and did not show sleep apnea but was not snoring then.  No LE edema.  She is not sexually active.  She is s/p hysterectomy/ BSO at age 51 for endometriosis.  She has severe issues with constipation.  She reports that when sits down to urinate, she will have little "rabbit balls" fall out.  She also had some RLQ pain which she feels may be related to constipation.  She does not drink enough water.   She does have a history of atrophic vaginitis and was prescribed estrogen cream (Dr. Romelle Starcher) but has stopped taking it because she was not sure why she was using it.  No SUI.    At her initial visit, recurrent UTI prevention stragities were discussed, constipation management was discussed and patient was encouraged to speak with her PCP about possible sleep apnea.      PMH: Past Medical History:  Diagnosis Date  . Bowel obstruction   . Chronic fatigue   . Depression   . Fibromyalgia   . GERD (gastroesophageal  reflux disease)   . Hyperlipemia   . Migraine   . Obstipation   . Osteopenia   . PUD (peptic ulcer disease)     Surgical History: Past Surgical History:  Procedure Laterality Date  . ABDOMINAL SURGERY    . APPENDECTOMY  2005  . BREAST BIOPSY Bilateral 1999   rt w/out clip - neg, lt w/clip - neg  . BREAST CYST ASPIRATION Right 2003   neg  . CHOLECYSTECTOMY  2004  . TOTAL ABDOMINAL HYSTERECTOMY W/ BILATERAL SALPINGOOPHORECTOMY  1986    Home Medications:  Allergies as of 03/02/2016      Reactions   Tizanidine Other (See Comments)   "throws me in a different world"   Aspirin    REACTION: GI bleed   Buspirone Hcl    REACTION: unspecified   Ciprofloxacin    REACTION: unspecified   Codeine    Diazepam    Duloxetine    REACTION: sz like activity   Ibuprofen    REACTION: GI bleed   Oxycodone-acetaminophen    REACTION: unspecified   Pregabalin    REACTION: kept her up and confusion   Propoxyphene Hcl    Risperidone    REACTION: confusion   Sulfonamide Derivatives    REACTION: unspecified   Tramadol Hcl    REACTION: doesn't remember what happened but couldn't  tolerate   Venlafaxine    REACTION: unspecified      Medication List       Accurate as of 03/02/16  1:46 PM. Always use your most recent med list.          ALPRAZolam 1 MG tablet Commonly known as:  XANAX TAKE 1 TABLET THREE TIMES A DAY AS NEEDED   cholecalciferol 1000 units tablet Commonly known as:  VITAMIN D Take 1,000 Units by mouth daily. Reported on 09/21/2015   gabapentin 600 MG tablet Commonly known as:  NEURONTIN TAKE 4 TABLETS BY MOUTH AT BEDTIME   HYDROcodone-acetaminophen 5-325 MG tablet Commonly known as:  NORCO/VICODIN Take 1 tablet by mouth 3 (three) times daily as needed.   omeprazole 20 MG capsule Commonly known as:  PRILOSEC TAKE 1 CAPSULE BY MOUTH TWICE A DAY   SUMAtriptan 50 MG tablet Commonly known as:  IMITREX Take 0.5-1 tablets (25-50 mg total) by mouth daily as needed  for migraine.       Allergies:  Allergies  Allergen Reactions  . Tizanidine Other (See Comments)    "throws me in a different world"  . Aspirin     REACTION: GI bleed  . Buspirone Hcl     REACTION: unspecified  . Ciprofloxacin     REACTION: unspecified  . Codeine   . Diazepam   . Duloxetine     REACTION: sz like activity  . Ibuprofen     REACTION: GI bleed  . Oxycodone-Acetaminophen     REACTION: unspecified  . Pregabalin     REACTION: kept her up and confusion  . Propoxyphene Hcl   . Risperidone     REACTION: confusion  . Sulfonamide Derivatives     REACTION: unspecified  . Tramadol Hcl     REACTION: doesn't remember what happened but couldn't tolerate  . Venlafaxine     REACTION: unspecified    Family History: Family History  Problem Relation Age of Onset  . Heart disease Mother     AMI  . Stroke Mother   . Heart disease Father   . Hypertension Sister   . Breast cancer Sister   . Hypertension Sister   . Heart disease Sister     MI  . Diabetes Sister   . Kidney cancer Neg Hx   . Kidney disease Neg Hx   . Prostate cancer Neg Hx     Social History:  reports that she quit smoking about 2 years ago. Her smoking use included Cigarettes. She has a 40.00 pack-year smoking history. She has never used smokeless tobacco. She reports that she drinks alcohol. She reports that she does not use drugs.  ROS:                                        Physical Exam: There were no vitals taken for this visit.  Constitutional:  Alert and oriented, No acute distress. HEENT: [place] AT, moist mucus membranes.  Trachea midline, no masses. Cardiovascular: No clubbing, cyanosis, or edema. Respiratory: Normal respiratory effort, no increased work of breathing. GI: Abdomen is soft, nontender, nondistended, no abdominal masses GU: No CVA tenderness.  Skin: No rashes, bruises or suspicious lesions. Lymph: No cervical  adenopathy. Neurologic: Grossly intact,  no focal deficits, moving all 4 extremities. Psychiatric: Normal mood and affect.  Laboratory Data: Lab Results  Component Value Date   WBC 7.9 12/24/2015  HGB 12.4 12/24/2015   HCT 36.8 12/24/2015   MCV 88.5 12/24/2015   PLT 292.0 12/24/2015    Lab Results  Component Value Date   CREATININE 0.74 12/24/2015     Urinalysis UA today negative   Pertinent Imaging: n/a   Assessment & Plan:    1. Recurrent UTI Recommend probiotic, increasing water intake, resuming vaginal estrogen cream, and discussed hygiene issues No need for further work up at this time given only 2 infections Will return if continues to have recurrent infections for further work up possibly including RUS/ cysto - Urinalysis, Complete  2. Nocturia Discussed possibility of underlying OSA especially in the setting of snoring and absence of daytime voiding issues Will discuss further with PCP for consideration of sleep study  3. Other constipation Discussed bowel clean out with mag citrate followed by maintenance colace bid/ metamucil/ probiotic Encouraged adequate hydration   No Follow-up on file.  Lindsey Ward, [facility] [address] [place], [place] [address] [phone]

## 2016-03-08 ENCOUNTER — Encounter: Payer: Self-pay | Admitting: Internal Medicine

## 2016-03-08 ENCOUNTER — Ambulatory Visit (INDEPENDENT_AMBULATORY_CARE_PROVIDER_SITE_OTHER): Payer: Medicare Other | Admitting: Internal Medicine

## 2016-03-08 VITALS — BP 118/68 | HR 60 | Temp 97.7°F | Ht 62.5 in | Wt 132.0 lb

## 2016-03-08 DIAGNOSIS — M5441 Lumbago with sciatica, right side: Secondary | ICD-10-CM

## 2016-03-08 DIAGNOSIS — Z7189 Other specified counseling: Secondary | ICD-10-CM

## 2016-03-08 DIAGNOSIS — F39 Unspecified mood [affective] disorder: Secondary | ICD-10-CM

## 2016-03-08 DIAGNOSIS — E785 Hyperlipidemia, unspecified: Secondary | ICD-10-CM | POA: Diagnosis not present

## 2016-03-08 DIAGNOSIS — K219 Gastro-esophageal reflux disease without esophagitis: Secondary | ICD-10-CM

## 2016-03-08 DIAGNOSIS — G8929 Other chronic pain: Secondary | ICD-10-CM

## 2016-03-08 DIAGNOSIS — Z0000 Encounter for general adult medical examination without abnormal findings: Secondary | ICD-10-CM

## 2016-03-08 LAB — LIPID PANEL
Cholesterol: 231 mg/dL — ABNORMAL HIGH (ref 0–200)
HDL: 42.4 mg/dL (ref >39.00)
LDL Cholesterol: 162 mg/dL — ABNORMAL HIGH (ref 0–99)
NonHDL: 188.29
Total CHOL/HDL Ratio: 5
Triglycerides: 129 mg/dL (ref 0.0–149.0)
VLDL: 25.8 mg/dL (ref 0.0–40.0)

## 2016-03-08 MED ORDER — TETANUS-DIPHTHERIA TOXOIDS TD 5-2 LFU IM INJ: 0.5000 mL | INJECTION | Freq: Once | INTRAMUSCULAR | 0 refills | Status: AC

## 2016-03-08 MED ORDER — HYDROCODONE-ACETAMINOPHEN 5-325 MG PO TABS: 1.0000 | ORAL_TABLET | Freq: Three times a day (TID) | ORAL | 0 refills | Status: DC | PRN

## 2016-03-08 NOTE — Assessment & Plan Note (Signed)
Ongoing anxiety and dysthymia No change needed

## 2016-03-08 NOTE — Assessment & Plan Note (Signed)
Fairly high but discussed meds---and we decided not to start statin Will recheck though

## 2016-03-08 NOTE — Progress Notes (Signed)
Subjective:    Patient ID: Lindsey Ward, female    DOB: [birth year], 65 y.o.   MRN: [id number]  HPI Here for Medicare wellness and follow up of chronic medical conditions Reviewed form and advanced directives Reviewed other doctors No tobacco or alcohol Tries to walk occasionally--discussed Vision is okay but eye doctor found something in left eye (not cataract)--may need surgery at some point Hearing is okay No falls Chronic mood issues--mostly anxiety but also intermittent depression Independent with instrumental ADLs Still has cognitive issues--mostly inattention (trouble finishing tasks)  Ongoing back pain Has to use the norco most days---- at most 2-3 on her bad days  Recent visit to urgent care Throat and chest tightening up EKG, etc were fine Got 3 prescriptions--had to stop the antibiotic (amoxil 875) but changed to ceftin Overall feeling better--did get another spell of chest pain (better when she held her chest briefly)  Stays constipated Doesn't take any meds now--- brief help from stool softener Used miralax in past--every day. Discussed restarting No blood or pain  Still gets hand and foot cramping Various times Only on the right side Will have to jump out of bed--eventually goes away once she stretches it out  Only takes the omeprazole as needed No regular heartburn or swallowing problems  Still having anxiety issues Stable and chronic Still able to go out and do her normal activities Mostly a problem if she is alone  Current Outpatient Prescriptions on File Prior to Visit  Medication Sig Dispense Refill  . ALPRAZolam (XANAX) 1 MG tablet TAKE 1 TABLET THREE TIMES A DAY AS NEEDED 120 tablet 0  . cholecalciferol (VITAMIN D) 1000 UNITS tablet Take 1,000 Units by mouth daily. Reported on 09/21/2015    . gabapentin (NEURONTIN) 600 MG tablet TAKE 4 TABLETS BY MOUTH AT BEDTIME 360 tablet 3  . HYDROcodone-acetaminophen (NORCO/VICODIN) 5-325 MG tablet Take 1  tablet by mouth 3 (three) times daily as needed. 90 tablet 0  . omeprazole (PRILOSEC) 20 MG capsule TAKE 1 CAPSULE BY MOUTH TWICE A DAY 180 capsule 1  . SUMAtriptan (IMITREX) 50 MG tablet Take 0.5-1 tablets (25-50 mg total) by mouth daily as needed for migraine. 10 tablet 5   No current facility-administered medications on file prior to visit.     Allergies  Allergen Reactions  . Tizanidine Other (See Comments)    "throws me in a different world"  . Aspirin     REACTION: GI bleed  . Buspirone Hcl     REACTION: unspecified  . Ciprofloxacin     REACTION: unspecified  . Codeine   . Diazepam   . Duloxetine     REACTION: sz like activity  . Ibuprofen     REACTION: GI bleed  . Oxycodone-Acetaminophen     REACTION: unspecified  . Pregabalin     REACTION: kept her up and confusion  . Propoxyphene Hcl   . Risperidone     REACTION: confusion  . Sulfonamide Derivatives     REACTION: unspecified  . Tramadol Hcl     REACTION: doesn't remember what happened but couldn't tolerate  . Venlafaxine     REACTION: unspecified    Past Medical History:  Diagnosis Date  . Bowel obstruction   . Chronic fatigue   . Depression   . Fibromyalgia   . GERD (gastroesophageal reflux disease)   . Hyperlipemia   . Migraine   . Obstipation   . Osteopenia   . PUD (peptic ulcer disease)  Past Surgical History:  Procedure Laterality Date  . ABDOMINAL SURGERY    . APPENDECTOMY  2005  . BREAST BIOPSY Bilateral 1999   rt w/out clip - neg, lt w/clip - neg  . BREAST CYST ASPIRATION Right 2003   neg  . CHOLECYSTECTOMY  2004  . TOTAL ABDOMINAL HYSTERECTOMY W/ BILATERAL SALPINGOOPHORECTOMY  1986    Family History  Problem Relation Age of Onset  . Heart disease Mother     AMI  . Stroke Mother   . Heart disease Father   . Hypertension Sister   . Breast cancer Sister   . Hypertension Sister   . Heart disease Sister     MI  . Diabetes Sister   . Kidney cancer Neg Hx   . Kidney disease  Neg Hx   . Prostate cancer Neg Hx     Social History   Social History  . Marital status: Married    Spouse name: N/A  . Number of children: 1  . Years of education: N/A   Occupational History  . DISABLED    Social History Main Topics  . Smoking status: Former Smoker    Packs/day: 1.00    Years: 40.00    Types: Cigarettes    Quit date: 09/25/2013  . Smokeless tobacco: Never Used     Comment:    . Alcohol use 0.0 oz/week     Comment: rarely  . Drug use: No  . Sexual activity: Not on file   Other Topics Concern  . Not on file   Social History Narrative   Living alone again      No living will   Daughter should make health care decisions   Requests DNR---done 10/24/14   No tube feeds if cognitively unaware   Review of Systems Appetite is okay Weight is stable Sleep is still not great--- gets 2-3 hours at a time Wears seat belt Teeth okay--- keeps up with dentist (in [place]) Going back to the urologist--recurrent symptoms No sig joint pains. Low back and neck are still a problem Some mild rash--- does see dermatologist ([facility] on [address])    Objective:   Physical Exam  Constitutional: She is oriented to person, place, and time. She appears well-developed and well-nourished. No distress.  HENT:  Mouth/Throat: Oropharynx is clear and moist. No oropharyngeal exudate.  Neck: Normal range of motion. Neck supple. No thyromegaly present.  Cardiovascular: Normal rate, normal heart sounds and intact distal pulses.  Exam reveals no gallop.   No murmur heard. Pulmonary/Chest: Effort normal and breath sounds normal. No respiratory distress. She has no wheezes. She has no rales.  Abdominal: Soft. There is no tenderness.  Musculoskeletal: She exhibits no edema or tenderness.  Lymphadenopathy:    She has no cervical adenopathy.  Neurological: She is alert and oriented to person, place, and time.  President-- "Dwaine Deter,  Bush? [id number] Recall 3/3    Skin: No rash noted. No erythema.  Psychiatric: She has a normal mood and affect.          Assessment & Plan:

## 2016-03-08 NOTE — Progress Notes (Signed)
Pre visit review using our clinic review tool, if applicable. No additional management support is needed unless otherwise documented below in the visit note. 

## 2016-03-08 NOTE — Assessment & Plan Note (Signed)
Doing okay with the PPI only as needed now

## 2016-03-08 NOTE — Assessment & Plan Note (Addendum)
Continues on analgesics Tries to walk Checked CSRS--didn't show up (something wrong with the system?)  Checked site again --had first and last name switched No Rx other than from me

## 2016-03-08 NOTE — Patient Instructions (Signed)
Please restart the miralax -- 1 capful with a full glass of water-- every day.

## 2016-03-08 NOTE — Assessment & Plan Note (Signed)
DNR reviewed

## 2016-03-08 NOTE — Assessment & Plan Note (Signed)
I have personally reviewed the Medicare Annual Wellness questionnaire and have noted 1. The patient's medical and social history 2. Their use of alcohol, tobacco or illicit drugs 3. Their current medications and supplements 4. The patient's functional ability including ADL's, fall risks, home safety risks and hearing or visual             impairment. 5. Diet and physical activities 6. Evidence for depression or mood disorders  The patients weight, height, BMI and visual acuity have been recorded in the chart I have made referrals, counseling and provided education to the patient based review of the above and I have provided the pt with a written personalized care plan for preventive services.  I have provided you with a copy of your personalized plan for preventive services. Please take the time to review along with your updated medication list.  Due to Td --Rx sent to pharmacy Mammogram is scheduled Colon due 2022

## 2016-03-22 ENCOUNTER — Ambulatory Visit: Payer: Medicare Other | Admitting: Urology

## 2016-03-24 ENCOUNTER — Ambulatory Visit (INDEPENDENT_AMBULATORY_CARE_PROVIDER_SITE_OTHER): Payer: Medicare Other | Admitting: Urology

## 2016-03-24 ENCOUNTER — Encounter: Payer: Self-pay | Admitting: Urology

## 2016-03-24 ENCOUNTER — Ambulatory Visit
Admission: RE | Admit: 2016-03-24 | Discharge: 2016-03-24 | Disposition: A | Discharge status: Home / Self Care | Payer: Medicare Other | Source: Ambulatory Visit | Attending: Internal Medicine | Admitting: Internal Medicine

## 2016-03-24 VITALS — BP 111/67 | HR 70 | Ht 62.5 in | Wt 135.9 lb

## 2016-03-24 DIAGNOSIS — Z1231 Encounter for screening mammogram for malignant neoplasm of breast: Secondary | ICD-10-CM

## 2016-03-24 DIAGNOSIS — R351 Nocturia: Secondary | ICD-10-CM

## 2016-03-24 DIAGNOSIS — N390 Urinary tract infection, site not specified: Secondary | ICD-10-CM

## 2016-03-24 DIAGNOSIS — K5909 Other constipation: Secondary | ICD-10-CM | POA: Diagnosis not present

## 2016-03-24 LAB — URINALYSIS, COMPLETE
Bilirubin, UA: NEGATIVE
Glucose, UA: NEGATIVE
Ketones, UA: NEGATIVE
Leukocytes, UA: NEGATIVE
Nitrite, UA: NEGATIVE
Protein, UA: NEGATIVE
Specific Gravity, UA: <1.005 — ABNORMAL LOW (ref 1.005–1.030)
Urobilinogen, Ur: 0.2 mg/dL (ref 0.2–1.0)
pH, UA: 6 (ref 5.0–7.5)

## 2016-03-24 LAB — MICROSCOPIC EXAMINATION
Bacteria, UA: NONE SEEN
WBC, UA: NONE SEEN /hpf (ref 0–?)

## 2016-03-24 NOTE — Progress Notes (Signed)
03/24/2016 2:49 PM   Lindsey Ward April 25, 1950 [id number]  Referring provider: Venia Carbon, MD [place], [place] [address]  Chief Complaint  Patient presents with  . Recurrent UTI    HPI: Patient is a 66 year old Caucasian female who presents today for an urgent appointment due to UTI symptoms and the feeling of incomplete bladder emptying.    Background history 66 yo F recently seen the ED with dizziness, falling and taken to the ED on 05/25/15 ultimately dx with a UTI.  She was treated and her symptoms resolved.  She did not have any associated urinary symptoms including no dysuria, hematuria, frequency or urgency.  She has had 2 UTIs since 01/2015 (E. Coli).  Prior to that, she has a remote history of UTIs just after she was married.   She complains of nocturia x3-4 for the past year.  Prior to 1 year ago, she did not have any nocturia.  No daytime symptoms.  She has tried cutting back on beverages which as not helped.  She does snore and she had a sleep study 6-7 years ago and did not show sleep apnea but was not snoring then.  No LE edema.  She is not sexually active.  She is s/p hysterectomy/ BSO at age 37 for endometriosis.  She has severe issues with constipation.  She reports that when sits down to urinate, she will have little "rabbit balls" fall out.  She also had some RLQ pain which she feels may be related to constipation.  She does not drink enough water.   She does have a history of atrophic vaginitis and was prescribed estrogen cream (Dr. Romelle Starcher) but has stopped taking it because she was not sure why she was using it.  No SUI.    At her initial visit, recurrent UTI prevention strategies were discussed, constipation management was discussed and patient was encouraged to speak with her PCP about possible sleep apnea.    She is having to rush to the restroom seven times daily, she visits the restroom seven times daily, she limits her fluids three times  daily, she is not aware of restrooms when visiting places, she has leaked urine three times weekly and she is getting up seven times nightly.  Her CATH UA was unremarkable.  Her PVR was 60 mL.    She is refusing to have a sleep study.    She has been placed back on Miralax.    PMH: Past Medical History:  Diagnosis Date  . Bowel obstruction   . Chronic fatigue   . Depression   . Fibromyalgia   . GERD (gastroesophageal reflux disease)   . Hyperlipemia   . Migraine   . Obstipation   . Osteopenia   . PUD (peptic ulcer disease)     Surgical History: Past Surgical History:  Procedure Laterality Date  . ABDOMINAL SURGERY    . APPENDECTOMY  2005  . BREAST BIOPSY Bilateral 1999   rt w/out clip - neg, lt w/clip - neg  . BREAST CYST ASPIRATION Right 2003   neg  . CHOLECYSTECTOMY  2004  . TOTAL ABDOMINAL HYSTERECTOMY W/ BILATERAL SALPINGOOPHORECTOMY  1986    Home Medications:  Allergies as of 03/24/2016      Reactions   Tizanidine Other (See Comments)   "throws me in a different world"   Aspirin    REACTION: GI bleed   Buspirone Hcl    REACTION: unspecified   Ciprofloxacin  REACTION: unspecified   Codeine    Diazepam    Duloxetine    REACTION: sz like activity   Ibuprofen    REACTION: GI bleed   Oxycodone-acetaminophen    REACTION: unspecified   Pregabalin    REACTION: kept her up and confusion   Propoxyphene Hcl    Risperidone    REACTION: confusion   Sulfonamide Derivatives    REACTION: unspecified   Tramadol Hcl    REACTION: doesn't remember what happened but couldn't tolerate   Venlafaxine    REACTION: unspecified      Medication List       Accurate as of 03/24/16  2:49 PM. Always use your most recent med list.          ALPRAZolam 1 MG tablet Commonly known as:  XANAX TAKE 1 TABLET THREE TIMES A DAY AS NEEDED   cetirizine 10 MG tablet Commonly known as:  ZYRTEC Take 10 mg by mouth daily.   cholecalciferol 1000 units tablet Commonly known as:   VITAMIN D Take 1,000 Units by mouth daily. Reported on 09/21/2015   gabapentin 600 MG tablet Commonly known as:  NEURONTIN TAKE 4 TABLETS BY MOUTH AT BEDTIME   HYDROcodone-acetaminophen 5-325 MG tablet Commonly known as:  NORCO/VICODIN Take 1 tablet by mouth 3 (three) times daily as needed.   omeprazole 20 MG capsule Commonly known as:  PRILOSEC TAKE 1 CAPSULE BY MOUTH TWICE A DAY   polyethylene glycol packet Commonly known as:  MIRALAX / GLYCOLAX Take 17 g by mouth daily.   SUMAtriptan 50 MG tablet Commonly known as:  IMITREX Take 0.5-1 tablets (25-50 mg total) by mouth daily as needed for migraine.       Allergies:  Allergies  Allergen Reactions  . Tizanidine Other (See Comments)    "throws me in a different world"  . Aspirin     REACTION: GI bleed  . Buspirone Hcl     REACTION: unspecified  . Ciprofloxacin     REACTION: unspecified  . Codeine   . Diazepam   . Duloxetine     REACTION: sz like activity  . Ibuprofen     REACTION: GI bleed  . Oxycodone-Acetaminophen     REACTION: unspecified  . Pregabalin     REACTION: kept her up and confusion  . Propoxyphene Hcl   . Risperidone     REACTION: confusion  . Sulfonamide Derivatives     REACTION: unspecified  . Tramadol Hcl     REACTION: doesn't remember what happened but couldn't tolerate  . Venlafaxine     REACTION: unspecified    Family History: Family History  Problem Relation Age of Onset  . Heart disease Mother     AMI  . Stroke Mother   . Heart disease Father   . Hypertension Sister   . Breast cancer Sister   . Hypertension Sister   . Heart disease Sister     MI  . Diabetes Sister   . Kidney cancer Neg Hx   . Kidney disease Neg Hx   . Prostate cancer Neg Hx     Social History:  reports that she quit smoking about 2 years ago. Her smoking use included Cigarettes. She has a 40.00 pack-year smoking history. She has never used smokeless tobacco. She reports that she drinks alcohol. She reports  that she does not use drugs.  ROS: UROLOGY Frequent Urination?: No Hard to postpone urination?: No Burning/pain with urination?: No Get up at night to urinate?: Yes  Leakage of urine?: No Urine stream starts and stops?: No Trouble starting stream?: No Do you have to strain to urinate?: No Blood in urine?: No Urinary tract infection?: No Sexually transmitted disease?: No Injury to kidneys or bladder?: No Painful intercourse?: No Weak stream?: No Currently pregnant?: No Vaginal bleeding?: No Last menstrual period?: n  Gastrointestinal Nausea?: No Vomiting?: No Indigestion/heartburn?: No Diarrhea?: No Constipation?: Yes  Constitutional Fever: No Night sweats?: No Weight loss?: No Fatigue?: Yes  Skin Skin rash/lesions?: Yes Itching?: Yes  Eyes Blurred vision?: No Double vision?: No  Ears/Nose/Throat Sore throat?: No Sinus problems?: No  Hematologic/Lymphatic Swollen glands?: No Easy bruising?: No  Cardiovascular Leg swelling?: No Chest pain?: No  Respiratory Cough?: No Shortness of breath?: No  Endocrine Excessive thirst?: No  Musculoskeletal Back pain?: Yes Joint pain?: Yes  Neurological Headaches?: Yes Dizziness?: No  Psychologic Depression?: Yes Anxiety?: Yes  Physical Exam: BP 111/67 (BP Location: Left Arm, Patient Position: Sitting, Cuff Size: Normal)   Pulse 70   Ht 5' 2.5" (1.588 m)   Wt 135 lb 14.4 oz (61.6 kg)   BMI 24.46 kg/m   Constitutional:  Alert and oriented, No acute distress. HEENT: [place] AT, moist mucus membranes.  Trachea midline, no masses. Cardiovascular: No clubbing, cyanosis, or edema. Respiratory: Normal respiratory effort, no increased work of breathing. GI: Abdomen is soft, nontender, nondistended, no abdominal masses GU: No CVA tenderness.  Skin: No rashes, bruises or suspicious lesions. Lymph: No cervical  adenopathy. Neurologic: Grossly intact, no focal deficits, moving all 4 extremities. Psychiatric:  Normal mood and affect.  Laboratory Data: Lab Results  Component Value Date   WBC 7.9 12/24/2015   HGB 12.4 12/24/2015   HCT 36.8 12/24/2015   MCV 88.5 12/24/2015   PLT 292.0 12/24/2015    Lab Results  Component Value Date   CREATININE 0.74 12/24/2015     Urinalysis UA today negative   Assessment & Plan:    1. Recurrent UTI  - Recommend probiotic, increasing water intake, resuming vaginal estrogen cream, and discussed hygiene issues  - No need for further work up at this time given only 2 infections  - Will return if continues to have recurrent infections for further work up possibly including RUS/ cysto  - Urinalysis, Complete  2. Nocturia  - refuses sleep study  3. Other constipation  - back on MiraLax   Return if symptoms worsen or fail to improve.  Zara Council, [facility] [address], [place], [place] [address] [phone]

## 2016-03-24 NOTE — Progress Notes (Signed)
In and Out Catheterization  Patient is present today for a I & O catheterization due to recurrent UTI. Patient was cleaned and prepped in a sterile fashion with betadine and Lidocaine 2% jelly was instilled into the urethra.  A 14FR cath was inserted no complications were noted , 50ml of urine return was noted, urine was light yellow in color. A clean urine sample was collected for UA. Bladder was drained  And catheter was removed with out difficulty.    Preformed by: Elberta Leatherwood, CMA

## 2016-04-01 ENCOUNTER — Other Ambulatory Visit: Payer: Self-pay | Admitting: Internal Medicine

## 2016-04-01 NOTE — Telephone Encounter (Signed)
Approved: #[id number] x 0 

## 2016-04-01 NOTE — Telephone Encounter (Signed)
Last filled 02-15-16 #120 Last OV 03-08-16 Next OV 09-08-16

## 2016-04-01 NOTE — Telephone Encounter (Signed)
Left refill on voice mail at pharmacy  

## 2016-04-25 ENCOUNTER — Ambulatory Visit (INDEPENDENT_AMBULATORY_CARE_PROVIDER_SITE_OTHER): Payer: Medicare Other | Admitting: Internal Medicine

## 2016-04-25 ENCOUNTER — Telehealth: Payer: Self-pay | Admitting: Internal Medicine

## 2016-04-25 ENCOUNTER — Encounter: Payer: Self-pay | Admitting: Internal Medicine

## 2016-04-25 VITALS — BP 110/62 | HR 58 | Temp 97.5°F | Wt 133.8 lb

## 2016-04-25 DIAGNOSIS — R0609 Other forms of dyspnea: Secondary | ICD-10-CM | POA: Diagnosis not present

## 2016-04-25 DIAGNOSIS — R5383 Other fatigue: Secondary | ICD-10-CM | POA: Diagnosis not present

## 2016-04-25 DIAGNOSIS — R42 Dizziness and giddiness: Secondary | ICD-10-CM

## 2016-04-25 DIAGNOSIS — R0600 Dyspnea, unspecified: Secondary | ICD-10-CM

## 2016-04-25 NOTE — Telephone Encounter (Signed)
[facility] Patient Name: Lindsey Ward DOB: [birth year] Initial Comment Caller states, she has been sick for two weeks. She was having exhaustion with breathing trouble. Nurse Assessment Nurse: Dimas Chyle, RN, Dellis Filbert Date/Time [name] Time): 04/25/2016 9:25:13 AM Confirm and document reason for call. If symptomatic, describe symptoms. ---Caller states, she has been sick for two weeks. She was having exhaustion with breathing trouble. No fever. Does the patient have any new or worsening symptoms? ---Yes Will a triage be completed? ---Yes Related visit to physician within the last 2 weeks? ---No Does the PT have any chronic conditions? (i.e. diabetes, asthma, etc.) ---Yes List chronic conditions. ---Anxiety, fibromyalgia Is this a behavioral health or substance abuse call? ---No Guidelines Guideline Title Affirmed Question Affirmed Notes Breathing Difficulty [1] MILD difficulty breathing (e.g., minimal/no SOB at rest, SOB with walking, pulse <100) AND [2] NEW-onset or WORSE than normal Final Disposition User See Physician within 4 Hours (or PCP triage) Dimas Chyle, RN, Dellis Filbert Referrals REFERRED TO PCP OFFICE Disagree/Comply: Leta Baptist

## 2016-04-25 NOTE — Progress Notes (Signed)
Subjective:    Patient ID: Lindsey Ward, female    DOB: [birth year], 66 y.o.   MRN: [id number]  HPI  Pt presents to the clinic today with c/o fatigue, dizziness and shortness of breath. This started 2 weeks ago. The fatigue is not new, chronic for her. She is getting 4-5 hours of sleep at night. She has trouble falling asleep and staying asleep, despite taking Gabapentin and Xanax. She describes the dizziness as more of a sense of imbalance. She denies syncope. She has noticed some nasal congestion and post nasal drip. She denies ear pain, runny nose or cough. She does have SOB with exertion but not at rest. She denies chest pains but has noticed intermittent palpitations, which is also not new for her. She has not taken anything OTC other than what she is prescribed. She has not had sick contacts that she is aware of. Her immunizations are UTD. She does have a history of fibromyalgia. She no longer smokes.  Review of Systems      Past Medical History:  Diagnosis Date  . Bowel obstruction   . Chronic fatigue   . Depression   . Fibromyalgia   . GERD (gastroesophageal reflux disease)   . Hyperlipemia   . Migraine   . Obstipation   . Osteopenia   . PUD (peptic ulcer disease)     Current Outpatient Prescriptions  Medication Sig Dispense Refill  . ALPRAZolam (XANAX) 1 MG tablet TAKE 1 TABLET BY MOUTH 3 TIMES A DAY AS NEEDED 120 tablet 0  . cetirizine (ZYRTEC) 10 MG tablet Take 10 mg by mouth daily.  0  . cholecalciferol (VITAMIN D) 1000 UNITS tablet Take 1,000 Units by mouth daily. Reported on 09/21/2015    . gabapentin (NEURONTIN) 600 MG tablet TAKE 4 TABLETS BY MOUTH AT BEDTIME 360 tablet 3  . HYDROcodone-acetaminophen (NORCO/VICODIN) 5-325 MG tablet Take 1 tablet by mouth 3 (three) times daily as needed. 90 tablet 0  . omeprazole (PRILOSEC) 20 MG capsule TAKE 1 CAPSULE BY MOUTH TWICE A DAY 180 capsule 1  . polyethylene glycol (MIRALAX / GLYCOLAX) packet Take 17 g by mouth daily.      . SUMAtriptan (IMITREX) 50 MG tablet Take 0.5-1 tablets (25-50 mg total) by mouth daily as needed for migraine. 10 tablet 5   No current facility-administered medications for this visit.     Allergies  Allergen Reactions  . Tizanidine Other (See Comments)    "throws me in a different world"  . Aspirin     REACTION: GI bleed  . Buspirone Hcl     REACTION: unspecified  . Ciprofloxacin     REACTION: unspecified  . Codeine   . Diazepam   . Duloxetine     REACTION: sz like activity  . Ibuprofen     REACTION: GI bleed  . Oxycodone-Acetaminophen     REACTION: unspecified  . Pregabalin     REACTION: kept her up and confusion  . Propoxyphene Hcl   . Risperidone     REACTION: confusion  . Sulfonamide Derivatives     REACTION: unspecified  . Tramadol Hcl     REACTION: doesn't remember what happened but couldn't tolerate  . Venlafaxine     REACTION: unspecified    Family History  Problem Relation Age of Onset  . Heart disease Mother     AMI  . Stroke Mother   . Heart disease Father   . Hypertension Sister   . Breast cancer Sister   .  Hypertension Sister   . Heart disease Sister     MI  . Diabetes Sister   . Kidney cancer Neg Hx   . Kidney disease Neg Hx   . Prostate cancer Neg Hx     Social History   Social History  . Marital status: Single    Spouse name: N/A  . Number of children: 1  . Years of education: N/A   Occupational History  . DISABLED    Social History Main Topics  . Smoking status: Former Smoker    Packs/day: 1.00    Years: 40.00    Types: Cigarettes    Quit date: 09/25/2013  . Smokeless tobacco: Never Used     Comment:    . Alcohol use 0.0 oz/week     Comment: rarely  . Drug use: No  . Sexual activity: Not on file   Other Topics Concern  . Not on file   Social History Narrative   Living alone again      No living will   Daughter should make health care decisions   Requests DNR---done 10/24/14   No tube feeds if cognitively unaware      Constitutional: Pt reports fatigue. Denies fever, malaise, headache or abrupt weight changes.  HEENT: Pt reports nasal congestion. Denies eye pain, eye redness, ear pain, ringing in the ears, wax buildup, runny nose, bloody nose, or sore throat. Respiratory: Pt reports shortness of breath. Denies difficulty breathing, cough or sputum production.   Cardiovascular: Pt reports palpitations. Denies chest pain, chest tightness, palpitations or swelling in the hands or feet.  Gastrointestinal: Denies abdominal pain, bloating, constipation, diarrhea or blood in the stool.  Neurological: Pt reports dizziness. Denies difficulty with memory, difficulty with speech or problems with balance and coordination.    No other specific complaints in a complete review of systems (except as listed in HPI above).  Objective:   Physical Exam   BP 110/62   Pulse (!) 58   Temp 97.5 F (36.4 C) (Oral)   Wt 133 lb 12 oz (60.7 kg)   SpO2 98%   BMI 24.07 kg/m  Wt Readings from Last 3 Encounters:  04/25/16 133 lb 12 oz (60.7 kg)  03/24/16 135 lb 14.4 oz (61.6 kg)  03/08/16 132 lb (59.9 kg)    General: Appears her stated age, in NAD. HEENT: Head: normal shape and size, no sinus tenderness noted; Ears: Tm's retracted but intact, normal light reflex; Nose: mucosa pink and moist, septum midline; Throat/Mouth: Teeth present, mucosa pink and moist, + PND, no exudate, lesions or ulcerations noted.  Neck:  No adenopathy noted. Cardiovascular: Bradycardic with normal rhythm.  Pulmonary/Chest: Normal effort and positive vesicular breath sounds. No respiratory distress. No wheezes, rales or ronchi noted.  Neurological: Alert and oriented. Coordination normal.    BMET    Component Value Date/Time   NA 142 12/24/2015 1632   NA 132 (L) 06/12/2014 1840   K 4.5 12/24/2015 1632   K 3.8 06/12/2014 1840   CL 104 12/24/2015 1632   CL 96 (L) 06/12/2014 1840   CO2 30 12/24/2015 1632   CO2 28 06/12/2014 1840    GLUCOSE 75 12/24/2015 1632   GLUCOSE 88 06/12/2014 1840   BUN 11 12/24/2015 1632   BUN 20 06/12/2014 1840   CREATININE 0.74 12/24/2015 1632   CREATININE 0.97 06/12/2014 1840   CALCIUM 10.2 12/24/2015 1632   CALCIUM 9.0 06/12/2014 1840   GFRNONAA >60 05/24/2015 2007   GFRNONAA >60 06/12/2014 1840  GFRAA >60 05/24/2015 2007   GFRAA >60 06/12/2014 1840    Lipid Panel     Component Value Date/Time   CHOL 231 (H) 03/08/2016 1243   TRIG 129.0 03/08/2016 1243   HDL 42.40 03/08/2016 1243   CHOLHDL 5 03/08/2016 1243   VLDL 25.8 03/08/2016 1243   LDLCALC 162 (H) 03/08/2016 1243    CBC    Component Value Date/Time   WBC 7.9 12/24/2015 1632   RBC 4.16 12/24/2015 1632   HGB 12.4 12/24/2015 1632   HGB 12.9 06/12/2014 1840   HCT 36.8 12/24/2015 1632   HCT 38.3 06/12/2014 1840   PLT 292.0 12/24/2015 1632   PLT 300 06/12/2014 1840   MCV 88.5 12/24/2015 1632   MCV 93 06/12/2014 1840   MCH 32.6 05/24/2015 2007   MCHC 33.7 12/24/2015 1632   RDW 13.1 12/24/2015 1632   RDW 12.4 06/12/2014 1840   LYMPHSABS 2.1 12/24/2015 1632   MONOABS 0.1 12/24/2015 1632   EOSABS 0.1 12/24/2015 1632   BASOSABS 0.0 12/24/2015 1632    Hgb A1C No results found for: HGBA1C       Assessment & Plan:   Fatigue, dizziness and shortness of breath:  Possible allergies Advised her to start Zyrtec and Flonase OTC No bronchitis or pneumonia No concern for ACS  If symptoms persist will follow up with PCP Webb Silversmith, NP

## 2016-04-25 NOTE — Telephone Encounter (Signed)
Pt has appt 04/25/16 at 11:15 with Avie Echevaria NP.

## 2016-04-25 NOTE — Patient Instructions (Signed)
Dizziness Dizziness is a common problem. It makes you feel unsteady or lightheaded. You may feel like you are about to pass out (faint). Dizziness can lead to injury if you stumble or fall. Anyone can get dizzy, but dizziness is more common in older adults. This condition can be caused by a number of things, including:  Medicines.  Dehydration.  Illness. Follow these instructions at home: Following these instructions may help with your condition: Eating and drinking  Drink enough fluid to keep your pee (urine) clear or pale yellow. This helps to keep you from getting dehydrated. Try to drink more clear fluids, such as water.  Do not drink alcohol.  Limit how much caffeine you drink or eat if told by your doctor.  Limit how much salt you drink or eat if told by your doctor. Activity  Avoid making quick movements.  When you stand up from sitting in a chair, steady yourself until you feel okay.  In the morning, first sit up on the side of the bed. When you feel okay, stand slowly while you hold onto something. Do this until you know that your balance is fine.  Move your legs often if you need to stand in one place for a long time. Tighten and relax your muscles in your legs while you are standing.  Do not drive or use heavy machinery if you feel dizzy.  Avoid bending down if you feel dizzy. Place items in your home so that they are easy for you to reach without leaning over. Lifestyle  Do not use any tobacco products, including cigarettes, chewing tobacco, or electronic cigarettes. If you need help quitting, ask your doctor.  Try to lower your stress level, such as with yoga or meditation. Talk with your doctor if you need help. General instructions  Watch your dizziness for any changes.  Take medicines only as told by your doctor. Talk with your doctor if you think that your dizziness is caused by a medicine that you are taking.  Tell a friend or a family member that you are  feeling dizzy. If he or she notices any changes in your behavior, have this person call your doctor.  Keep all follow-up visits as told by your doctor. This is important. Contact a doctor if:  Your dizziness does not go away.  Your dizziness or light-headedness gets worse.  You feel sick to your stomach (nauseous).  You have trouble hearing.  You have new symptoms.  You are unsteady on your feet or you feel like the room is spinning. Get help right away if:  You throw up (vomit) or have diarrhea and are unable to eat or drink anything.  You have trouble:  Talking.  Walking.  Swallowing.  Using your arms, hands, or legs.  You feel generally weak.  You are not thinking clearly or you have trouble forming sentences. It may take a friend or family member to notice this.  You have:  Chest pain.  Pain in your belly (abdomen).  Shortness of breath.  Sweating.  Your vision changes.  You are bleeding.  You have a headache.  You have neck pain or a stiff neck.  You have a fever. This information is not intended to replace advice given to you by your health care provider. Make sure you discuss any questions you have with your health care provider. Document Released: 02/17/2011 Document Revised: 08/06/2015 Document Reviewed: 02/24/2014 Elsevier Interactive Patient Education  2017 [employer] Inc[employer]  

## 2016-04-25 NOTE — Telephone Encounter (Signed)
Form placed in Dr Alla German Inbox to sign when he returns tomorrow

## 2016-04-25 NOTE — Telephone Encounter (Signed)
Patient brought in an Application for Renewal of Disability Parking Placard form to be filled out by the provider.  The form was put in the provider's incoming prescription box.

## 2016-04-26 NOTE — Telephone Encounter (Signed)
Form done No charge 

## 2016-04-26 NOTE — Telephone Encounter (Signed)
I notified patient form is ready for pick up. °

## 2016-05-04 ENCOUNTER — Telehealth: Payer: Self-pay

## 2016-05-04 NOTE — Telephone Encounter (Signed)
Pt has appt for Fri 2/23 with Dr Silvio Pate

## 2016-05-04 NOTE — Telephone Encounter (Signed)
I think she should make a follow up appt to be reevaluated by Dr. Silvio Pate.

## 2016-05-04 NOTE — Telephone Encounter (Signed)
Pt left v/m; seen 04/25/16 with fluid in ears and congestion; pt has taken flonase and zyrtec and pt is no better. Pt wants to know if any thing else can be done. Please advise.

## 2016-05-06 ENCOUNTER — Other Ambulatory Visit: Payer: Self-pay | Admitting: Internal Medicine

## 2016-05-06 ENCOUNTER — Ambulatory Visit (INDEPENDENT_AMBULATORY_CARE_PROVIDER_SITE_OTHER): Payer: Medicare Other | Admitting: Internal Medicine

## 2016-05-06 ENCOUNTER — Encounter: Payer: Self-pay | Admitting: Internal Medicine

## 2016-05-06 VITALS — BP 120/72 | HR 60 | Temp 97.9°F | Wt 138.0 lb

## 2016-05-06 DIAGNOSIS — G629 Polyneuropathy, unspecified: Secondary | ICD-10-CM | POA: Diagnosis not present

## 2016-05-06 DIAGNOSIS — K219 Gastro-esophageal reflux disease without esophagitis: Secondary | ICD-10-CM | POA: Diagnosis not present

## 2016-05-06 DIAGNOSIS — F39 Unspecified mood [affective] disorder: Secondary | ICD-10-CM | POA: Diagnosis not present

## 2016-05-06 DIAGNOSIS — M797 Fibromyalgia: Secondary | ICD-10-CM

## 2016-05-06 LAB — VITAMIN B12: Vitamin B-12: 380 pg/mL (ref 211–911)

## 2016-05-06 MED ORDER — METHOCARBAMOL 500 MG PO TABS: 500.0000 mg | ORAL_TABLET | Freq: Every day | ORAL | 1 refills | Status: DC

## 2016-05-06 MED ORDER — OMEPRAZOLE 20 MG PO CPDR: 20.0000 mg | DELAYED_RELEASE_CAPSULE | Freq: Two times a day (BID) | ORAL | 3 refills | Status: DC

## 2016-05-06 NOTE — Assessment & Plan Note (Signed)
Ongoing problems Will try night methocarbamol

## 2016-05-06 NOTE — Assessment & Plan Note (Signed)
She has been inconsistent with PPI Discussed that she can't stop Will increase to bid till symptoms better

## 2016-05-06 NOTE — Progress Notes (Signed)
Subjective:    Patient ID: Lindsey Ward, female    DOB: 08/29/1950, 66 y.o.   MRN: [id number]  HPI "I feel bad"  Concerned about neuropathy Has tingling in feet and legs Hard to get around--even shopping Doesn't feel the B12 made any difference Takes the gabapentin 2400mg  at bedtime--still doesn't sleep well  Still has trouble with her throat Hard to swallow Sharp pains in back---like when she breaths in Does note some heartburn--not taking omeprazole consistently  Ongoing problems with fibromyalgia Asks about trying methocarbamol  Current Outpatient Prescriptions on File Prior to Visit  Medication Sig Dispense Refill  . ALPRAZolam (XANAX) 1 MG tablet TAKE 1 TABLET BY MOUTH 3 TIMES A DAY AS NEEDED 120 tablet 0  . cetirizine (ZYRTEC) 10 MG tablet Take 10 mg by mouth daily.  0  . gabapentin (NEURONTIN) 600 MG tablet TAKE 4 TABLETS BY MOUTH AT BEDTIME 360 tablet 3  . HYDROcodone-acetaminophen (NORCO/VICODIN) 5-325 MG tablet Take 1 tablet by mouth 3 (three) times daily as needed. 90 tablet 0  . omeprazole (PRILOSEC) 20 MG capsule TAKE 1 CAPSULE BY MOUTH TWICE A DAY 180 capsule 1  . SUMAtriptan (IMITREX) 50 MG tablet Take 0.5-1 tablets (25-50 mg total) by mouth daily as needed for migraine. 10 tablet 5  . cholecalciferol (VITAMIN D) 1000 UNITS tablet Take 1,000 Units by mouth daily. Reported on 09/21/2015     No current facility-administered medications on file prior to visit.     Allergies  Allergen Reactions  . Tizanidine Other (See Comments)    "throws me in a different world"  . Aspirin     REACTION: GI bleed  . Buspirone Hcl     REACTION: unspecified  . Ciprofloxacin     REACTION: unspecified  . Codeine   . Diazepam   . Duloxetine     REACTION: sz like activity  . Ibuprofen     REACTION: GI bleed  . Oxycodone-Acetaminophen     REACTION: unspecified  . Pregabalin     REACTION: kept her up and confusion  . Propoxyphene Hcl   . Risperidone     REACTION:  confusion  . Sulfonamide Derivatives     REACTION: unspecified  . Tramadol Hcl     REACTION: doesn't remember what happened but couldn't tolerate  . Venlafaxine     REACTION: unspecified    Past Medical History:  Diagnosis Date  . Bowel obstruction   . Chronic fatigue   . Depression   . Fibromyalgia   . GERD (gastroesophageal reflux disease)   . Hyperlipemia   . Migraine   . Obstipation   . Osteopenia   . PUD (peptic ulcer disease)     Past Surgical History:  Procedure Laterality Date  . ABDOMINAL SURGERY    . APPENDECTOMY  2005  . BREAST BIOPSY Bilateral 1999   rt w/out clip - neg, lt w/clip - neg  . BREAST CYST ASPIRATION Right 2003   neg  . CHOLECYSTECTOMY  2004  . TOTAL ABDOMINAL HYSTERECTOMY W/ BILATERAL SALPINGOOPHORECTOMY  1986    Family History  Problem Relation Age of Onset  . Heart disease Mother     AMI  . Stroke Mother   . Heart disease Father   . Hypertension Sister   . Breast cancer Sister   . Hypertension Sister   . Heart disease Sister     MI  . Diabetes Sister   . Kidney cancer Neg Hx   . Kidney disease Neg Hx   .  Prostate cancer Neg Hx     Social History   Social History  . Marital status: Single    Spouse name: N/A  . Number of children: 1  . Years of education: N/A   Occupational History  . DISABLED    Social History Main Topics  . Smoking status: Former Smoker    Packs/day: 1.00    Years: 40.00    Types: Cigarettes    Quit date: 09/25/2013  . Smokeless tobacco: Never Used     Comment:    . Alcohol use 0.0 oz/week     Comment: rarely  . Drug use: No  . Sexual activity: Not on file   Other Topics Concern  . Not on file   Social History Narrative   Living alone again      No living will   Daughter should make health care decisions   Requests DNR---done 10/24/14   No tube feeds if cognitively unaware   Review of Systems  Appetite is not great--tends to eat junk Has been gaining weight     Objective:   Physical  Exam  Constitutional: She appears well-nourished. No distress.  Neck: No thyromegaly present.  Cardiovascular: Normal rate, regular rhythm, normal heart sounds and intact distal pulses.  Exam reveals no gallop.   No murmur heard. Pulmonary/Chest: Effort normal and breath sounds normal. No respiratory distress. She has no wheezes. She has no rales.  Abdominal: Soft. There is no tenderness.  Musculoskeletal: She exhibits no edema.  Lymphadenopathy:    She has no cervical adenopathy.  Psychiatric:  Mild anxiety          Assessment & Plan:

## 2016-05-06 NOTE — Progress Notes (Signed)
Pre visit review using our clinic review tool, if applicable. No additional management support is needed unless otherwise documented below in the visit note. 

## 2016-05-06 NOTE — Patient Instructions (Signed)
Please take the omeprazole twice a day on an empty stomach till you have no more heartburn or swallowing problems (but at least 1 month). Then if you are doing well, you can cut back to once a day (but don't stop it).

## 2016-05-06 NOTE — Assessment & Plan Note (Signed)
Chronic anxiety Occasional palpitations Focused on physical issues Continue the alprazolam

## 2016-05-06 NOTE — Assessment & Plan Note (Signed)
Worse Will recheck B12---if low, will start injections If normal--set up eval with neurology ([place])

## 2016-05-13 ENCOUNTER — Encounter: Payer: Self-pay | Admitting: *Deleted

## 2016-05-13 ENCOUNTER — Ambulatory Visit
Admission: EM | Admit: 2016-05-13 | Discharge: 2016-05-13 | Disposition: A | Discharge status: Home / Self Care | Payer: Medicare Other | Attending: Family Medicine | Admitting: Family Medicine

## 2016-05-13 DIAGNOSIS — D649 Anemia, unspecified: Secondary | ICD-10-CM | POA: Diagnosis not present

## 2016-05-13 DIAGNOSIS — R5383 Other fatigue: Secondary | ICD-10-CM

## 2016-05-13 LAB — CBC WITH DIFFERENTIAL/PLATELET
Basophils Absolute: 0 10*3/uL (ref 0–0.1)
Basophils Relative: 0 %
Eosinophils Absolute: 0.1 10*3/uL (ref 0–0.7)
Eosinophils Relative: 2 %
HCT: 30.9 % — ABNORMAL LOW (ref 35.0–47.0)
Hemoglobin: 9.6 g/dL — ABNORMAL LOW (ref 12.0–16.0)
Lymphocytes Relative: 21 %
Lymphs Abs: 1.4 10*3/uL (ref 1.0–3.6)
MCH: 24.5 pg — ABNORMAL LOW (ref 26.0–34.0)
MCHC: 31.1 g/dL — ABNORMAL LOW (ref 32.0–36.0)
MCV: 78.9 fL — ABNORMAL LOW (ref 80.0–100.0)
Monocytes Absolute: 0.4 10*3/uL (ref 0.2–0.9)
Monocytes Relative: 6 %
Neutro Abs: 5 10*3/uL (ref 1.4–6.5)
Neutrophils Relative %: 71 %
Platelets: 333 10*3/uL (ref 150–440)
RBC: 3.91 MIL/uL (ref 3.80–5.20)
RDW: 15.7 % — ABNORMAL HIGH (ref 11.5–14.5)
WBC: 7 10*3/uL (ref 3.6–11.0)

## 2016-05-13 LAB — COMPREHENSIVE METABOLIC PANEL
ALT: 13 U/L — ABNORMAL LOW (ref 14–54)
AST: 23 U/L (ref 15–41)
Albumin: 3.8 g/dL (ref 3.5–5.0)
Alkaline Phosphatase: 93 U/L (ref 38–126)
Anion gap: 10 (ref 5–15)
BUN: 13 mg/dL (ref 6–20)
CO2: 26 mmol/L (ref 22–32)
Calcium: 9.3 mg/dL (ref 8.9–10.3)
Chloride: 97 mmol/L — ABNORMAL LOW (ref 101–111)
Creatinine, Ser: 0.68 mg/dL (ref 0.44–1.00)
GFR calc Af Amer: >60 mL/min (ref >60)
GFR calc non Af Amer: >60 mL/min (ref >60)
Glucose, Bld: 102 mg/dL — ABNORMAL HIGH (ref 65–99)
Potassium: 4.1 mmol/L (ref 3.5–5.1)
Sodium: 133 mmol/L — ABNORMAL LOW (ref 135–145)
Total Bilirubin: 0.4 mg/dL (ref 0.3–1.2)
Total Protein: 7 g/dL (ref 6.5–8.1)

## 2016-05-13 LAB — TSH: TSH: 1.329 u[IU]/mL (ref 0.350–4.500)

## 2016-05-13 LAB — OCCULT BLOOD X 1 CARD TO LAB, STOOL: Fecal Occult Bld: NEGATIVE

## 2016-05-13 NOTE — ED Triage Notes (Signed)
Patient started having symptoms of fatigue, body aches, and throat swelling 1 month ago. Patient has been seen at [facility] clinic 3 weeks ago.

## 2016-05-13 NOTE — ED Provider Notes (Signed)
CSN: [id number]     Arrival date & time 05/13/16  1131 History   First MD Initiated Contact with Patient 05/13/16 1243     Chief Complaint  Patient presents with  . Fatigue  . Generalized Body Aches  . Oral Swelling   (Consider location/radiation/quality/duration/timing/severity/associated sxs/prior Treatment) HPI  This is a 66 year old female who presents with symptoms of fatigue body aches throat swelling for 1 month. She,, after talking with her, had this  for a number of years. Review of her medical history she has a history of myalgia ,depression and mood disorder, neuropathy and panic attacks. She has recently been seen by her primary care physician who provided her with medication for seasonal allergies. He also has an appointment with her neurologist but it is not until May. She came here because "I can't wait that long". He has no fever or chills.Not Complaining of any specific area pain but generalized to the hips to knees the ankles.       Past Medical History:  Diagnosis Date  . Bowel obstruction   . Chronic fatigue   . Depression   . Fibromyalgia   . GERD (gastroesophageal reflux disease)   . Hyperlipemia   . Migraine   . Obstipation   . Osteopenia   . PUD (peptic ulcer disease)    Past Surgical History:  Procedure Laterality Date  . ABDOMINAL SURGERY    . APPENDECTOMY  2005  . BREAST BIOPSY Bilateral 1999   rt w/out clip - neg, lt w/clip - neg  . BREAST CYST ASPIRATION Right 2003   neg  . CHOLECYSTECTOMY  2004  . TOTAL ABDOMINAL HYSTERECTOMY W/ BILATERAL SALPINGOOPHORECTOMY  1986   Family History  Problem Relation Age of Onset  . Heart disease Mother     AMI  . Stroke Mother   . Heart disease Father   . Hypertension Sister   . Breast cancer Sister   . Hypertension Sister   . Heart disease Sister     MI  . Diabetes Sister   . Kidney cancer Neg Hx   . Kidney disease Neg Hx   . Prostate cancer Neg Hx    Social History  Substance Use Topics  .  Smoking status: Former Smoker    Packs/day: 1.00    Years: 40.00    Types: Cigarettes    Quit date: 09/25/2013  . Smokeless tobacco: Never Used     Comment:    . Alcohol use 0.0 oz/week     Comment: rarely   OB History    No data available     Review of Systems  Constitutional: Positive for activity change, appetite change and fatigue. Negative for chills and fever.  Psychiatric/Behavioral: Positive for dysphoric mood.  All other systems reviewed and are negative.   Allergies  Tizanidine; Aspirin; Buspirone hcl; Ciprofloxacin; Codeine; Diazepam; Duloxetine; Ibuprofen; Oxycodone-acetaminophen; Pregabalin; Propoxyphene hcl; Risperidone; Sulfonamide derivatives; Tramadol hcl; and Venlafaxine  Home Medications   Prior to Admission medications   Medication Sig Start Date End Date Taking? Authorizing Provider  ALPRAZolam Duanne Moron) 1 MG tablet TAKE 1 TABLET BY MOUTH 3 TIMES A DAY AS NEEDED 04/01/16  Yes Venia Carbon, MD  cetirizine (ZYRTEC) 10 MG tablet Take 10 mg by mouth daily. 02/24/16  Yes Historical Provider, MD  fluticasone (FLONASE ALLERGY RELIEF) 50 MCG/ACT nasal spray Place 2 sprays into both nostrils daily.   Yes Historical Provider, MD  gabapentin (NEURONTIN) 600 MG tablet TAKE 4 TABLETS BY MOUTH AT BEDTIME 05/18/15  Yes Venia Carbon, MD  HYDROcodone-acetaminophen (NORCO/VICODIN) 5-325 MG tablet Take 1 tablet by mouth 3 (three) times daily as needed. 03/08/16  Yes Venia Carbon, MD  methocarbamol (ROBAXIN) 500 MG tablet Take 1 tablet (500 mg total) by mouth at bedtime. 05/06/16  Yes Venia Carbon, MD  omeprazole (PRILOSEC) 20 MG capsule Take 1 capsule (20 mg total) by mouth 2 (two) times daily. 05/06/16  Yes Venia Carbon, MD  Probiotic Product (PROBIOTIC DAILY PO) Take by mouth.   Yes Historical Provider, MD  cholecalciferol (VITAMIN D) 1000 UNITS tablet Take 1,000 Units by mouth daily. Reported on 09/21/2015    Historical Provider, MD  SUMAtriptan (IMITREX) 50 MG  tablet Take 0.5-1 tablets (25-50 mg total) by mouth daily as needed for migraine. 12/24/15   Venia Carbon, MD   Meds Ordered and Administered this Visit  Medications - No data to display  BP (!) 108/51 (BP Location: Left Arm)   Pulse 66   Temp 97.5 F (36.4 C) (Oral)   Resp 15   Ht 5\' 3"  (1.6 m)   Wt 134 lb (60.8 kg)   SpO2 96%   BMI 23.74 kg/m  No data found.   Physical Exam  Urgent Care Course     Procedures (including critical care time)  Labs Review Labs Reviewed  CBC WITH DIFFERENTIAL/PLATELET - Abnormal; Notable for the following:       Result Value   Hemoglobin 9.6 (*)    HCT 30.9 (*)    MCV 78.9 (*)    MCH 24.5 (*)    MCHC 31.1 (*)    RDW 15.7 (*)    All other components within normal limits  COMPREHENSIVE METABOLIC PANEL - Abnormal; Notable for the following:    Sodium 133 (*)    Chloride 97 (*)    Glucose, Bld 102 (*)    ALT 13 (*)    All other components within normal limits  OCCULT BLOOD X 1 CARD TO LAB, STOOL  TSH    Imaging Review No results found.   Visual Acuity Review  Right Eye Distance:   Left Eye Distance:   Bilateral Distance:    Right Eye Near:   Left Eye Near:    Bilateral Near:         MDM   1. Fatigue, unspecified type   2. Anemia, unspecified type    Discharge Medication List as of 05/13/2016  2:52 PM    Plan: 1. Test/x-ray results and diagnosis reviewed with patient 2. rx as per orders; risks, benefits, potential side effects reviewed with patient 3. Recommend supportive treatment with Fluids and nutritional diet.Because of the drop in her blood indices indicate anemia she will need to have a internal medicine workup. I told her this could be a cause of some of her fatigue. No medications were provided to the patient. Arrange an appointment with her primary care physician for next week 4. F/u prn if symptoms worsen or don't improve     Lorin Picket, PA-C 05/13/16 1501

## 2016-05-17 ENCOUNTER — Ambulatory Visit: Payer: Medicare Other | Admitting: Internal Medicine

## 2016-05-17 ENCOUNTER — Encounter: Payer: Self-pay | Admitting: Family Medicine

## 2016-05-17 ENCOUNTER — Ambulatory Visit (INDEPENDENT_AMBULATORY_CARE_PROVIDER_SITE_OTHER): Payer: Medicare Other | Admitting: Family Medicine

## 2016-05-17 DIAGNOSIS — K5900 Constipation, unspecified: Secondary | ICD-10-CM

## 2016-05-17 DIAGNOSIS — R5383 Other fatigue: Secondary | ICD-10-CM

## 2016-05-17 DIAGNOSIS — D509 Iron deficiency anemia, unspecified: Secondary | ICD-10-CM

## 2016-05-17 LAB — CBC WITH DIFFERENTIAL/PLATELET
Basophils Absolute: 0.1 10*3/uL (ref 0.0–0.1)
Basophils Relative: 0.9 % (ref 0.0–3.0)
Eosinophils Absolute: 0.2 10*3/uL (ref 0.0–0.7)
Eosinophils Relative: 2.7 % (ref 0.0–5.0)
HCT: 28.7 % — ABNORMAL LOW (ref 36.0–46.0)
Hemoglobin: 9.1 g/dL — ABNORMAL LOW (ref 12.0–15.0)
Lymphocytes Relative: 21.7 % (ref 12.0–46.0)
Lymphs Abs: 1.5 10*3/uL (ref 0.7–4.0)
MCHC: 31.7 g/dL (ref 30.0–36.0)
MCV: 79.1 fl (ref 78.0–100.0)
Monocytes Absolute: 0.5 10*3/uL (ref 0.1–1.0)
Monocytes Relative: 7.5 % (ref 3.0–12.0)
Neutro Abs: 4.7 10*3/uL (ref 1.4–7.7)
Neutrophils Relative %: 67.2 % (ref 43.0–77.0)
Platelets: 353 10*3/uL (ref 150.0–400.0)
RBC: 3.62 Mil/uL — ABNORMAL LOW (ref 3.87–5.11)
RDW: 15.8 % — ABNORMAL HIGH (ref 11.5–15.5)
WBC: 7 10*3/uL (ref 4.0–10.5)

## 2016-05-17 LAB — VITAMIN B12: Vitamin B-12: 637 pg/mL (ref 211–911)

## 2016-05-17 LAB — IBC PANEL
Iron: 21 ug/dL — ABNORMAL LOW (ref 42–145)
Saturation Ratios: 4.7 % — ABNORMAL LOW (ref 20.0–50.0)
Transferrin: 320 mg/dL (ref 212.0–360.0)

## 2016-05-17 LAB — FERRITIN: Ferritin: 6.2 ng/mL — ABNORMAL LOW (ref 10.0–291.0)

## 2016-05-17 NOTE — Assessment & Plan Note (Signed)
Increase water and fiber in diet.  New for pt.

## 2016-05-17 NOTE — Progress Notes (Signed)
   Subjective:    Patient ID: Lindsey Ward, female    DOB: 01-Sep-1950, 66 y.o.   MRN: [id number] HPI   66 year old female  Pt of Dr. Silvio Pate with history of depression, fibromyalgia, neuropathy and chronic low back pain presents with  Chronic fatigue x years, worse in last month..  Seen at ER on 3/2 for fatigue Hg 9.6  (4 months ago Hg was 12.4)  NA 133 Stool card neg Lab Results  Component Value Date   TSH 1.329 05/13/2016    Hx of anemia, but sure why. 20 years ago.  She has occ chest pain at rest  None with exertion, occ shortness of breath with exertion.   Occ lightheadedness.  She has pain all through her body, longterm poorly controlled fibromyalgia.  No blood in stool, no darkened stool. No vaginal bleeding. No gum bleeding. Used to have issues with diarrhea.  She does have some constipation now , not sure how long. Miralax does not help. Uses colace off and on.  Picky eater, does not eat much.  Nml colonoscopy in 2012  Review of Systems  Constitutional: Negative for fatigue and fever.  HENT: Negative for ear pain.   Eyes: Negative for pain.  Respiratory: Negative for chest tightness and shortness of breath.   Cardiovascular: Negative for chest pain, palpitations and leg swelling.  Gastrointestinal: Negative for abdominal pain.  Genitourinary: Negative for dysuria.       Objective:e   Physical Exam  Constitutional: Vital signs are normal. She appears well-developed and well-nourished. She is cooperative.  Non-toxic appearance. She does not appear ill. No distress.  HENT:  Head: Normocephalic.  Right Ear: Hearing, tympanic membrane, external ear and ear canal normal. Tympanic membrane is not erythematous, not retracted and not bulging.  Left Ear: Hearing, tympanic membrane, external ear and ear canal normal. Tympanic membrane is not erythematous, not retracted and not bulging.  Nose: No mucosal edema or rhinorrhea. Right sinus exhibits no maxillary sinus  tenderness and no frontal sinus tenderness. Left sinus exhibits no maxillary sinus tenderness and no frontal sinus tenderness.  Mouth/Throat: Uvula is midline, oropharynx is clear and moist and mucous membranes are normal.  Eyes: Conjunctivae, EOM and lids are normal. Pupils are equal, round, and reactive to light. Lids are everted and swept, no foreign bodies found.  Neck: Trachea normal and normal range of motion. Neck supple. Carotid bruit is not present. No thyroid mass and no thyromegaly present.  Cardiovascular: Normal rate, regular rhythm, S1 normal, S2 normal, normal heart sounds, intact distal pulses and normal pulses.  Exam reveals no gallop and no friction rub.   No murmur heard. Pulmonary/Chest: Effort normal and breath sounds normal. No tachypnea. No respiratory distress. She has no decreased breath sounds. She has no wheezes. She has no rhonchi. She has no rales.  Abdominal: Soft. Normal appearance and bowel sounds are normal. There is no hepatosplenomegaly. There is tenderness in the right lower quadrant and left lower quadrant. There is no rigidity, no guarding and no CVA tenderness. No hernia.  Neurological: She is alert.  Skin: Skin is warm, dry and intact. No rash noted.  Psychiatric: Her speech is normal and behavior is normal. Judgment and thought content normal. Her mood appears not anxious. Cognition and memory are normal. She does not exhibit a depressed mood.          Assessment & Plan:

## 2016-05-17 NOTE — Patient Instructions (Addendum)
Increase water and fiber in diet. Okay to uses colace in diet.  Please stop at the lab to set up to have labs drawn.  We will call with lab results.

## 2016-05-17 NOTE — Progress Notes (Signed)
Pre visit review using our clinic review tool, if applicable. No additional management support is needed unless otherwise documented below in the visit note. 

## 2016-05-17 NOTE — Assessment & Plan Note (Addendum)
No clear source of loss. No visualized blood loss, stool test negative.  Possible nutritional deficiency given poor pt po.  Eval iron levels. Likely will need iron supplement, low dose given constipation.

## 2016-05-17 NOTE — Assessment & Plan Note (Signed)
Likely multifactorial but at least in part due to new anemia.

## 2016-05-18 ENCOUNTER — Telehealth: Payer: Self-pay | Admitting: *Deleted

## 2016-05-18 MED ORDER — FERROUS SULFATE 325 (65 FE) MG PO TABS: 325.0000 mg | ORAL_TABLET | Freq: Every day | ORAL | 3 refills | Status: DC

## 2016-05-18 NOTE — Telephone Encounter (Signed)
-----   Message from Jinny Sanders, MD sent at 05/17/2016  4:22 PM EST ----- Notify pt iron is low. Start ferrous sulfate  325 mg daily #30 3 RF, call in please. Keep appt with GI, if not within next 1 week.. Have her call for follow up labs to recheck hemoglobin in 1 week.

## 2016-05-18 NOTE — Telephone Encounter (Signed)
Ms. Faley notified as instructed by telephone.  Ferrous Sulfate Rx sent into [facility] on [address] in [place].  Patient will call back to schedule lab appointment to recheck hemoglobin if she does not get in with the GI in the next week.

## 2016-05-22 ENCOUNTER — Other Ambulatory Visit: Payer: Self-pay | Admitting: Internal Medicine

## 2016-05-23 ENCOUNTER — Ambulatory Visit: Payer: Medicare Other | Admitting: Gastroenterology

## 2016-05-23 NOTE — Telephone Encounter (Signed)
Approved: #120 x 0 for alprazolam Others can be 1 year

## 2016-05-23 NOTE — Telephone Encounter (Signed)
Last Xanax Rx 04/01/2016 and gabapentin 05/2015 #360 3R. Last OV 04/2016

## 2016-05-25 ENCOUNTER — Other Ambulatory Visit: Payer: Self-pay

## 2016-05-25 ENCOUNTER — Encounter: Payer: Self-pay | Admitting: Gastroenterology

## 2016-05-25 ENCOUNTER — Ambulatory Visit (INDEPENDENT_AMBULATORY_CARE_PROVIDER_SITE_OTHER): Payer: Medicare Other | Admitting: Gastroenterology

## 2016-05-25 ENCOUNTER — Telehealth: Payer: Self-pay

## 2016-05-25 VITALS — BP 106/68 | HR 70 | Ht 62.5 in | Wt 139.4 lb

## 2016-05-25 DIAGNOSIS — D509 Iron deficiency anemia, unspecified: Secondary | ICD-10-CM

## 2016-05-25 DIAGNOSIS — K5903 Drug induced constipation: Secondary | ICD-10-CM | POA: Diagnosis not present

## 2016-05-25 MED ORDER — POLYETHYLENE GLYCOL 3350 17 GM/SCOOP PO POWD: 1.0000 | Freq: Every day | ORAL | 3 refills | Status: DC

## 2016-05-25 MED ORDER — MAGNESIUM CITRATE PO SOLN: 1.0000 | Freq: Once | ORAL | 0 refills | Status: AC

## 2016-05-25 NOTE — Progress Notes (Signed)
Gastroenterology Consultation  Referring Provider:     Jinny Sanders, MD Primary Care Physician:  Viviana Simpler, MD Primary Gastroenterologist:  Dr. Jonathon Bellows  Reason for Consultation:     Anemia        HPI:   Lindsey Ward is a 66 y.o. y/o female referred for consultation & management  by Dr. Diona Browner. She was recently seen at the ER for fatigue. Drop of Hb noted from 12.4 grams to 9.6 grams , low iron 21, ferritin 6.2 , ,MCV 79 .last colonoscopy back in 2012 which was normal (?prior partial colon resection at the level of the cecum-unclear type of surgery which she says was performed 15 years back ). Urine analysis was negative for blood.   She says she would like to discuss about constipation.  Constipation: Onset many years ,  gradually getting worse  Frequency of bowel movements once every 10 days- few little droppings Consistency hard,  Shape tiny size  , any new change- always had issues with constipation  Pain : has to strain a lot , lot of pain before a bowel movement Bloating/abdominal distension yes  Bleeding no  Last colonoscopy 2012  Weight loss no , rather gained  Family history of colon cancer no  Diet no fruit or vegetables in her diet  Narcotic/anticholinergic medications She uses hydrocodone for fibromyalgia  Laxative use Miralax last used years back which worked and was stopped by another Tax adviser.  Thyroid abnormalities none   Denies any vaginal bleeding , no nasal bleeds, no blood in urine .   She is on oral iron which makes her more constipated. She feels very tired.    Past Medical History:  Diagnosis Date  . Bowel obstruction   . Chronic fatigue   . Depression   . Fibromyalgia   . GERD (gastroesophageal reflux disease)   . Hyperlipemia   . Migraine   . Obstipation   . Osteopenia   . PUD (peptic ulcer disease)     Past Surgical History:  Procedure Laterality Date  . ABDOMINAL SURGERY    . APPENDECTOMY  2005  . BREAST BIOPSY Bilateral 1999    rt w/out clip - neg, lt w/clip - neg  . BREAST CYST ASPIRATION Right 2003   neg  . CHOLECYSTECTOMY  2004  . TOTAL ABDOMINAL HYSTERECTOMY W/ BILATERAL SALPINGOOPHORECTOMY  1986    Prior to Admission medications   Medication Sig Start Date End Date Taking? Authorizing Provider  ALPRAZolam Duanne Moron) 1 MG tablet TAKE 1 TABLET BY MOUTH 3 TIMES A DAY AS NEEDED 05/23/16   Venia Carbon, MD  cetirizine (ZYRTEC) 10 MG tablet TAKE 1 TABLET BY MOUTH EVERY DAY 05/23/16   Venia Carbon, MD  cholecalciferol (VITAMIN D) 1000 UNITS tablet Take 1,000 Units by mouth daily. Reported on 09/21/2015    Historical Provider, MD  ferrous sulfate 325 (65 FE) MG tablet Take 1 tablet (325 mg total) by mouth daily with breakfast. 05/18/16   Amy Cletis Athens, MD  fluticasone (FLONASE ALLERGY RELIEF) 50 MCG/ACT nasal spray Place 2 sprays into both nostrils daily.    Historical Provider, MD  gabapentin (NEURONTIN) 600 MG tablet TAKE 4 TABLETS BY MOUTH AT BEDTIME 05/23/16   Venia Carbon, MD  HYDROcodone-acetaminophen (NORCO/VICODIN) 5-325 MG tablet Take 1 tablet by mouth 3 (three) times daily as needed. 03/08/16   Venia Carbon, MD  methocarbamol (ROBAXIN) 500 MG tablet Take 1 tablet (500 mg total) by mouth at bedtime. 05/06/16  Venia Carbon, MD  omeprazole (PRILOSEC) 20 MG capsule Take 1 capsule (20 mg total) by mouth 2 (two) times daily. 05/06/16   Venia Carbon, MD  Probiotic Product (PROBIOTIC DAILY PO) Take by mouth.    Historical Provider, MD  SUMAtriptan (IMITREX) 50 MG tablet Take 0.5-1 tablets (25-50 mg total) by mouth daily as needed for migraine. 12/24/15   Venia Carbon, MD    Family History  Problem Relation Age of Onset  . Heart disease Mother     AMI  . Stroke Mother   . Heart disease Father   . Hypertension Sister   . Breast cancer Sister   . Hypertension Sister   . Heart disease Sister     MI  . Diabetes Sister   . Kidney cancer Neg Hx   . Kidney disease Neg Hx   . Prostate cancer  Neg Hx      Social History  Substance Use Topics  . Smoking status: Former Smoker    Packs/day: 1.00    Years: 40.00    Types: Cigarettes    Quit date: 09/25/2013  . Smokeless tobacco: Never Used     Comment:    . Alcohol use 0.0 oz/week     Comment: rarely    Allergies as of 05/25/2016 - Review Complete 05/17/2016  Allergen Reaction Noted  . Tizanidine Other (See Comments) 10/24/2014  . Aspirin  01/10/2006  . Buspirone hcl  04/12/2006  . Ciprofloxacin  04/12/2006  . Codeine  07/25/2008  . Diazepam  01/10/2006  . Duloxetine  01/10/2006  . Ibuprofen  01/10/2006  . Oxycodone-acetaminophen  04/12/2006  . Pregabalin    . Propoxyphene hcl  01/10/2006  . Risperidone  01/10/2006  . Sulfonamide derivatives  04/12/2006  . Tramadol hcl  01/11/2008  . Venlafaxine  04/12/2006    Review of Systems:    All systems reviewed and negative except where noted in HPI.   Physical Exam:  BP 106/68   Pulse 70   Ht 5' 2.5" (1.588 m)   Wt 139 lb 6.4 oz (63.2 kg)   BMI 25.09 kg/m  No LMP recorded. Patient has had a hysterectomy. Psych:  Alert and cooperative. Normal mood and affect. General:   Alert,  Well-developed, well-nourished, pleasant and cooperative in NAD Head:  Normocephalic and atraumatic. Eyes:  Sclera clear, no icterus.   Conjunctiva pink. Ears:  Normal auditory acuity. Nose:  No deformity, discharge, or lesions. Mouth:  No deformity or lesions,oropharynx pink & moist. Neck:  Supple; no masses or thyromegaly. Lungs:  Respirations even and unlabored.  Clear throughout to auscultation.   No wheezes, crackles, or rhonchi. No acute distress. Heart:  Regular rate and rhythm; no murmurs, clicks, rubs, or gallops. Abdomen:  Normal bowel sounds.  No bruits.  Soft, non-tender and non-distended without masses, hepatosplenomegaly or hernias noted.  No guarding or rebound tenderness.    Msk:  Symmetrical without gross deformities. Good, equal movement & strength bilaterally. Pulses:   Normal pulses noted. Extremities:  No clubbing or edema.  No cyanosis. Neurologic:  Alert and oriented x3;  grossly normal neurologically. Skin:  Intact without significant lesions or rashes. No jaundice. Lymph Nodes:  No significant cervical adenopathy. Psych:  Alert and cooperative. Normal mood and affect.  Imaging Studies: No results found.  Assessment and Plan:   RYDER CHESMORE is a 67 y.o. y/o female has been referred for iron deficiency anemia. She also has chronic constipation which may be related to chronic  hydrocodone use. Her diet practically has no fiber which can be contributing to her constipation   Plan  1. Stop oral iron as she has severe constipation - Will refer for IV iron  2. Start miralax daily ,if no better will change to linzess next time  3. Magnesium citrate today to clean  4. EGD+ colonoscopy +/- capsule study to evaluate for chronic blood loss 5. High fiber diet - patient information provided.    Follow up in 10-12 weeks   Dr Jonathon Bellows MD

## 2016-05-25 NOTE — Telephone Encounter (Signed)
Gastroenterology Pre-Procedure Review  Request Date: 06/09/16 Requesting Physician: Dr. Vicente Males   PATIENT REVIEW QUESTIONS: The patient responded to the following health history questions as indicated:    1. Are you having any GI issues? yes (constipation) 2. Do you have a personal history of Polyps? no 3. Do you have a family history of Colon Cancer or Polyps? no 4. Diabetes Mellitus? no 5. Joint replacements in the past 12 months?no 6. Major health problems in the past 3 months?no 7. Any artificial heart valves, MVP, or defibrillator?no    MEDICATIONS & ALLERGIES:    Patient reports the following regarding taking any anticoagulation/antiplatelet therapy:   Plavix, Coumadin, Eliquis, Xarelto, Lovenox, Pradaxa, Brilinta, or Effient? no Aspirin? no  Patient confirms/reports the following medications:  Current Outpatient Prescriptions  Medication Sig Dispense Refill  . ALPRAZolam (XANAX) 1 MG tablet TAKE 1 TABLET BY MOUTH 3 TIMES A DAY AS NEEDED 120 tablet 0  . cetirizine (ZYRTEC) 10 MG tablet TAKE 1 TABLET BY MOUTH EVERY DAY 30 tablet 11  . cholecalciferol (VITAMIN D) 1000 UNITS tablet Take 1,000 Units by mouth daily. Reported on 09/21/2015    . ferrous sulfate 325 (65 FE) MG tablet Take 1 tablet (325 mg total) by mouth daily with breakfast. 30 tablet 3  . fluticasone (FLONASE ALLERGY RELIEF) 50 MCG/ACT nasal spray Place 2 sprays into both nostrils daily.    Marland Kitchen gabapentin (NEURONTIN) 600 MG tablet TAKE 4 TABLETS BY MOUTH AT BEDTIME 360 tablet 3  . HYDROcodone-acetaminophen (NORCO/VICODIN) 5-325 MG tablet Take 1 tablet by mouth 3 (three) times daily as needed. 90 tablet 0  . magnesium citrate SOLN Take 296 mLs (1 Bottle total) by mouth once. 195 mL 0  . methocarbamol (ROBAXIN) 500 MG tablet Take 1 tablet (500 mg total) by mouth at bedtime. (Patient not taking: Reported on 05/25/2016) 30 tablet 1  . omeprazole (PRILOSEC) 20 MG capsule Take 1 capsule (20 mg total) by mouth 2 (two) times daily.  180 capsule 3  . polyethylene glycol powder (GLYCOLAX/MIRALAX) powder Take 255 g by mouth daily. 255 g 3  . Probiotic Product (PROBIOTIC DAILY PO) Take by mouth.    . SUMAtriptan (IMITREX) 50 MG tablet Take 0.5-1 tablets (25-50 mg total) by mouth daily as needed for migraine. 10 tablet 5   No current facility-administered medications for this visit.     Patient confirms/reports the following allergies:  Allergies  Allergen Reactions  . Tizanidine Other (See Comments)    "throws me in a different world"  . Aspirin     REACTION: GI bleed  . Buspirone Hcl     REACTION: unspecified  . Ciprofloxacin     REACTION: unspecified  . Codeine   . Diazepam   . Duloxetine     REACTION: sz like activity  . Ibuprofen     REACTION: GI bleed  . Oxycodone-Acetaminophen     REACTION: unspecified  . Pregabalin     REACTION: kept her up and confusion  . Propoxyphene Hcl   . Risperidone     REACTION: confusion  . Sulfonamide Derivatives     REACTION: unspecified  . Tramadol Hcl     REACTION: doesn't remember what happened but couldn't tolerate  . Venlafaxine     REACTION: unspecified    No orders of the defined types were placed in this encounter.   AUTHORIZATION INFORMATION Primary Insurance: 1D#: Group #:  Secondary Insurance: 1D#: Group #:  SCHEDULE INFORMATION: Date: 06/09/16 Time: Location: [place]

## 2016-05-27 ENCOUNTER — Telehealth: Payer: Self-pay | Admitting: Internal Medicine

## 2016-05-27 ENCOUNTER — Other Ambulatory Visit: Payer: Self-pay | Admitting: Internal Medicine

## 2016-05-27 DIAGNOSIS — R531 Weakness: Secondary | ICD-10-CM | POA: Diagnosis not present

## 2016-05-27 DIAGNOSIS — D649 Anemia, unspecified: Secondary | ICD-10-CM | POA: Diagnosis not present

## 2016-05-27 DIAGNOSIS — R42 Dizziness and giddiness: Secondary | ICD-10-CM | POA: Diagnosis not present

## 2016-05-27 DIAGNOSIS — R0602 Shortness of breath: Secondary | ICD-10-CM | POA: Diagnosis not present

## 2016-05-27 DIAGNOSIS — R5383 Other fatigue: Secondary | ICD-10-CM | POA: Diagnosis not present

## 2016-05-27 NOTE — Telephone Encounter (Signed)
Spoke to pt; very tired- awaiting appt with Dr.Rao; not until April 3 rd; also complained of bleeding per rectum; recommend eval at ER.   Nira Conn- we could see her on Tuesday [3/20] ]at [place]-at 1:30 if she wants to move her appt up. Thx

## 2016-05-30 NOTE — Telephone Encounter (Signed)
msg sent to cancer center sch. To see if patient would like to make an apt tomorrow with Dr. Kearney Hard

## 2016-06-02 ENCOUNTER — Telehealth: Payer: Self-pay | Admitting: Internal Medicine

## 2016-06-02 NOTE — Telephone Encounter (Signed)
Pt has appt  On 04/03 at [facility], she would like to be seen sooner, and will go to [facility] if needed.  Can you see if you can make sooner appt?  Pt callback number is [phone] Thanks

## 2016-06-03 NOTE — Telephone Encounter (Signed)
Spoke to pt and her Hematology Appt has been moved up to this Monday 06/06/16 with Dr Grayland Ormond.

## 2016-06-05 DIAGNOSIS — D509 Iron deficiency anemia, unspecified: Secondary | ICD-10-CM | POA: Insufficient documentation

## 2016-06-05 NOTE — Progress Notes (Signed)
[place]  [phone] [phone]  ID: SHRITA THIEN OB: 03-04-1951  MR#: [id number]  MIW#:[id number]  Patient Care Team: Venia Carbon, MD as PCP - General  CHIEF COMPLAINT: Iron deficiency anemia  INTERVAL HISTORY: Patient is a 66 year old female who was noted to have decreased hemoglobin and iron stores on routine blood work. She currently complains of increased weakness and fatigue for the past 3-4 weeks. She otherwise feels well. She has no neurologic complaints. She denies any recent fevers or illnesses. She has a good appetite and denies weight loss. She has no chest pain or shortness of breath. She denies any nausea, vomiting, constipation, or diarrhea. She denies any melena or hematochezia. She has no urinary complaints. Patient otherwise feels well and offers no further specific complaints.  REVIEW OF SYSTEMS:   Review of Systems  Constitutional: Positive for malaise/fatigue. Negative for fever and weight loss.  Respiratory: Negative.  Negative for cough.   Cardiovascular: Negative.  Negative for chest pain and leg swelling.  Gastrointestinal: Negative for abdominal pain, blood in stool and melena.  Genitourinary: Negative.   Musculoskeletal: Negative.   Neurological: Positive for weakness.  Psychiatric/Behavioral: Negative.  The patient is not nervous/anxious.     As per HPI. Otherwise, a complete review of systems is negative.  PAST MEDICAL HISTORY: Past Medical History:  Diagnosis Date  . Bowel obstruction   . Chronic fatigue   . Depression   . Fibromyalgia   . GERD (gastroesophageal reflux disease)   . Hyperlipemia   . Migraine   . Obstipation   . Osteopenia   . PUD (peptic ulcer disease)     PAST SURGICAL HISTORY: Past Surgical History:  Procedure Laterality Date  . ABDOMINAL SURGERY    . APPENDECTOMY  2005  . BREAST BIOPSY Bilateral 1999   rt w/out clip - neg, lt w/clip - neg  . BREAST CYST ASPIRATION Right  2003   neg  . CHOLECYSTECTOMY  2004  . COLONOSCOPY WITH PROPOFOL N/A 06/09/2016   Procedure: COLONOSCOPY WITH PROPOFOL;  Surgeon: Jonathon Bellows, MD;  Location: [facility] ENDOSCOPY;  Service: Endoscopy;  Laterality: N/A;  . ESOPHAGOGASTRODUODENOSCOPY (EGD) WITH PROPOFOL N/A 06/09/2016   Procedure: ESOPHAGOGASTRODUODENOSCOPY (EGD) WITH PROPOFOL;  Surgeon: Jonathon Bellows, MD;  Location: [facility] ENDOSCOPY;  Service: Endoscopy;  Laterality: N/A;  . TOTAL ABDOMINAL HYSTERECTOMY W/ BILATERAL SALPINGOOPHORECTOMY  1986    FAMILY HISTORY: Family History  Problem Relation Age of Onset  . Heart disease Mother     AMI  . Stroke Mother   . Heart disease Father   . Hypertension Sister   . Breast cancer Sister   . Hypertension Sister   . Heart disease Sister     MI  . Diabetes Sister   . Kidney cancer Neg Hx   . Kidney disease Neg Hx   . Prostate cancer Neg Hx     ADVANCED DIRECTIVES (Y/N):  N  HEALTH MAINTENANCE: Social History  Substance Use Topics  . Smoking status: Former Smoker    Packs/day: 1.00    Years: 40.00    Types: Cigarettes    Quit date: 09/25/2013  . Smokeless tobacco: Never Used     Comment:    . Alcohol use 0.0 oz/week     Comment: rarely     Colonoscopy:  PAP:  Bone density:  Lipid panel:  Allergies  Allergen Reactions  . Tizanidine Other (See Comments)    "throws me in a different world"  . Aspirin  REACTION: GI bleed  . Buspirone Hcl     REACTION: unspecified  . Ciprofloxacin     REACTION: unspecified  . Codeine   . Diazepam   . Duloxetine     REACTION: sz like activity  . Ibuprofen     REACTION: GI bleed  . Oxycodone-Acetaminophen     REACTION: unspecified  . Pregabalin     REACTION: kept her up and confusion  . Propoxyphene Hcl   . Risperidone     REACTION: confusion  . Sulfonamide Derivatives     REACTION: unspecified  . Tramadol Hcl     REACTION: doesn't remember what happened but couldn't tolerate  . Venlafaxine     REACTION: unspecified     Current Outpatient Prescriptions  Medication Sig Dispense Refill  . ALPRAZolam (XANAX) 1 MG tablet TAKE 1 TABLET BY MOUTH 3 TIMES A DAY AS NEEDED 120 tablet 0  . cetirizine (ZYRTEC) 10 MG tablet TAKE 1 TABLET BY MOUTH EVERY DAY 30 tablet 11  . cholecalciferol (VITAMIN D) 1000 UNITS tablet Take 1,000 Units by mouth daily. Reported on 09/21/2015    . ferrous sulfate 325 (65 FE) MG tablet Take 1 tablet (325 mg total) by mouth daily with breakfast. (Patient taking differently: Take 650 mg by mouth daily with breakfast. ) 30 tablet 3  . fluticasone (FLONASE ALLERGY RELIEF) 50 MCG/ACT nasal spray Place 2 sprays into both nostrils daily.    Marland Kitchen gabapentin (NEURONTIN) 600 MG tablet TAKE 4 TABLETS BY MOUTH AT BEDTIME 360 tablet 3  . HYDROcodone-acetaminophen (NORCO/VICODIN) 5-325 MG tablet Take 1 tablet by mouth 3 (three) times daily as needed. 90 tablet 0  . methocarbamol (ROBAXIN) 500 MG tablet Take 1 tablet (500 mg total) by mouth at bedtime. (Patient not taking: Reported on 05/25/2016) 30 tablet 1  . omeprazole (PRILOSEC) 20 MG capsule Take 1 capsule (20 mg total) by mouth 2 (two) times daily. 180 capsule 3  . polyethylene glycol powder (GLYCOLAX/MIRALAX) powder Take 255 g by mouth daily. 255 g 3  . Probiotic Product (PROBIOTIC DAILY PO) Take by mouth.    . SUMAtriptan (IMITREX) 50 MG tablet Take 0.5-1 tablets (25-50 mg total) by mouth daily as needed for migraine. 10 tablet 5   No current facility-administered medications for this visit.     OBJECTIVE: Vitals:   06/06/16 1429  BP: 126/74  Pulse: 64  Resp: 18  Temp: 97.6 F (36.4 C)     Body mass index is 25.05 kg/m.    ECOG FS:0 - Asymptomatic  General: Well-developed, well-nourished, no acute distress. Eyes: Pink conjunctiva, anicteric sclera. HEENT: Normocephalic, moist mucous membranes, clear oropharnyx. Lungs: Clear to auscultation bilaterally. Heart: Regular rate and rhythm. No rubs, murmurs, or gallops. Abdomen: Soft, nontender,  nondistended. No organomegaly noted, normoactive bowel sounds. Musculoskeletal: No edema, cyanosis, or clubbing. Neuro: Alert, answering all questions appropriately. Cranial nerves grossly intact. Skin: No rashes or petechiae noted. Psych: Normal affect. Lymphatics: No cervical, calvicular, axillary or inguinal LAD.   LAB RESULTS:  Lab Results  Component Value Date   NA 133 (L) 05/13/2016   K 4.1 05/13/2016   CL 97 (L) 05/13/2016   CO2 26 05/13/2016   GLUCOSE 102 (H) 05/13/2016   BUN 13 05/13/2016   CREATININE 0.68 05/13/2016   CALCIUM 9.3 05/13/2016   PROT 7.0 05/13/2016   ALBUMIN 3.8 05/13/2016   AST 23 05/13/2016   ALT 13 (L) 05/13/2016   ALKPHOS 93 05/13/2016   BILITOT 0.4 05/13/2016   GFRNONAA >60  05/13/2016   GFRAA >60 05/13/2016    Lab Results  Component Value Date   WBC 7.0 05/17/2016   NEUTROABS 4.7 05/17/2016   HGB 9.1 (L) 05/17/2016   HCT 28.7 (L) 05/17/2016   MCV 79.1 05/17/2016   PLT 353.0 05/17/2016   Lab Results  Component Value Date   IRON 21 (L) 05/17/2016   IRONPCTSAT 4.7 (L) 05/17/2016   Lab Results  Component Value Date   FERRITIN 6.2 (L) 05/17/2016     STUDIES: No results found.  ASSESSMENT: Iron deficiency anemia.  PLAN:  1. Iron deficiency anemia:  Unclear etiology, but patient has colonoscopy scheduled in the next several months. Patient's hemoglobin and iron stores are decreased and she is symptomatic. Proceed with 510 mg IV Feraheme later this week and a second dose in 1 week. Patient will then return to clinic in 2 months with repeat laboratory work and further evaluation. If patient's anemia does not resolve with iron infusions, will consider a full anemia workup in the future.  Approximately 45 minutes was spent in discussion of which greater than 50% was consultation.  Patient expressed understanding and was in agreement with this plan. She also understands that She can call clinic at any time with any questions, concerns, or  complaints.    Lloyd Huger, MD   06/12/2016 4:47 PM

## 2016-06-06 ENCOUNTER — Inpatient Hospital Stay: Payer: Medicare Other | Attending: Oncology | Admitting: Oncology

## 2016-06-06 DIAGNOSIS — M8580 Other specified disorders of bone density and structure, unspecified site: Secondary | ICD-10-CM | POA: Insufficient documentation

## 2016-06-06 DIAGNOSIS — F329 Major depressive disorder, single episode, unspecified: Secondary | ICD-10-CM | POA: Diagnosis not present

## 2016-06-06 DIAGNOSIS — D509 Iron deficiency anemia, unspecified: Secondary | ICD-10-CM | POA: Diagnosis not present

## 2016-06-06 DIAGNOSIS — Z79899 Other long term (current) drug therapy: Secondary | ICD-10-CM | POA: Insufficient documentation

## 2016-06-06 DIAGNOSIS — K56609 Unspecified intestinal obstruction, unspecified as to partial versus complete obstruction: Secondary | ICD-10-CM | POA: Insufficient documentation

## 2016-06-06 DIAGNOSIS — Z87891 Personal history of nicotine dependence: Secondary | ICD-10-CM | POA: Insufficient documentation

## 2016-06-06 DIAGNOSIS — E785 Hyperlipidemia, unspecified: Secondary | ICD-10-CM | POA: Diagnosis not present

## 2016-06-06 DIAGNOSIS — K219 Gastro-esophageal reflux disease without esophagitis: Secondary | ICD-10-CM | POA: Diagnosis not present

## 2016-06-06 DIAGNOSIS — M797 Fibromyalgia: Secondary | ICD-10-CM | POA: Diagnosis not present

## 2016-06-06 DIAGNOSIS — D508 Other iron deficiency anemias: Secondary | ICD-10-CM

## 2016-06-06 DIAGNOSIS — Z8711 Personal history of peptic ulcer disease: Secondary | ICD-10-CM | POA: Diagnosis not present

## 2016-06-06 NOTE — Progress Notes (Signed)
New evaluation for IDA. Pt complains of fatigue and decreased energy levels for past 3 weeks.

## 2016-06-08 ENCOUNTER — Encounter: Payer: Self-pay | Admitting: Internal Medicine

## 2016-06-08 ENCOUNTER — Other Ambulatory Visit: Payer: Medicare Other

## 2016-06-08 ENCOUNTER — Inpatient Hospital Stay: Payer: Medicare Other

## 2016-06-08 ENCOUNTER — Other Ambulatory Visit: Payer: Self-pay

## 2016-06-08 VITALS — BP 137/77 | HR 60 | Temp 96.8°F | Resp 20

## 2016-06-08 DIAGNOSIS — M8580 Other specified disorders of bone density and structure, unspecified site: Secondary | ICD-10-CM | POA: Diagnosis not present

## 2016-06-08 DIAGNOSIS — K56609 Unspecified intestinal obstruction, unspecified as to partial versus complete obstruction: Secondary | ICD-10-CM | POA: Diagnosis not present

## 2016-06-08 DIAGNOSIS — Z79899 Other long term (current) drug therapy: Secondary | ICD-10-CM | POA: Diagnosis not present

## 2016-06-08 DIAGNOSIS — Z8711 Personal history of peptic ulcer disease: Secondary | ICD-10-CM | POA: Diagnosis not present

## 2016-06-08 DIAGNOSIS — D508 Other iron deficiency anemias: Secondary | ICD-10-CM

## 2016-06-08 DIAGNOSIS — Z0283 Encounter for blood-alcohol and blood-drug test: Secondary | ICD-10-CM

## 2016-06-08 DIAGNOSIS — D509 Iron deficiency anemia, unspecified: Secondary | ICD-10-CM | POA: Diagnosis not present

## 2016-06-08 DIAGNOSIS — K219 Gastro-esophageal reflux disease without esophagitis: Secondary | ICD-10-CM | POA: Diagnosis not present

## 2016-06-08 DIAGNOSIS — E785 Hyperlipidemia, unspecified: Secondary | ICD-10-CM | POA: Diagnosis not present

## 2016-06-08 DIAGNOSIS — Z87891 Personal history of nicotine dependence: Secondary | ICD-10-CM | POA: Diagnosis not present

## 2016-06-08 DIAGNOSIS — M797 Fibromyalgia: Secondary | ICD-10-CM | POA: Diagnosis not present

## 2016-06-08 MED ORDER — SODIUM CHLORIDE 0.9 % IV SOLN
Freq: Once | INTRAVENOUS | Status: AC
  Administered 2016-06-08: 14:00:00 via INTRAVENOUS
  Filled 2016-06-08: qty 1000

## 2016-06-08 MED ORDER — SODIUM CHLORIDE 0.9 % IV SOLN
510.0000 mg | Freq: Once | INTRAVENOUS | Status: AC
  Administered 2016-06-08: 510 mg via INTRAVENOUS
  Filled 2016-06-08: qty 17

## 2016-06-08 MED ORDER — HYDROCODONE-ACETAMINOPHEN 5-325 MG PO TABS: 1.0000 | ORAL_TABLET | Freq: Three times a day (TID) | ORAL | 0 refills | Status: DC | PRN

## 2016-06-08 NOTE — Telephone Encounter (Signed)
Pt left v/m requesting rx hydrocodone apap. Call when ready for pick up. rx last printed # 90 on 03/08/16; last seen 05/06/16.

## 2016-06-08 NOTE — Telephone Encounter (Signed)
Left message that rx was up front ready for pickup

## 2016-06-09 ENCOUNTER — Encounter: Payer: Self-pay | Admitting: *Deleted

## 2016-06-09 ENCOUNTER — Ambulatory Visit: Payer: Medicare Other | Admitting: Certified Registered"

## 2016-06-09 ENCOUNTER — Ambulatory Visit
Admission: RE | Admit: 2016-06-09 | Discharge: 2016-06-09 | Disposition: A | Discharge status: Home / Self Care | Payer: Medicare Other | Source: Ambulatory Visit | Attending: Gastroenterology | Admitting: Gastroenterology

## 2016-06-09 ENCOUNTER — Encounter
Admission: RE | Disposition: A | Discharge status: Home / Self Care | Payer: Self-pay | Source: Ambulatory Visit | Attending: Gastroenterology

## 2016-06-09 DIAGNOSIS — M797 Fibromyalgia: Secondary | ICD-10-CM | POA: Diagnosis not present

## 2016-06-09 DIAGNOSIS — D509 Iron deficiency anemia, unspecified: Secondary | ICD-10-CM | POA: Diagnosis not present

## 2016-06-09 DIAGNOSIS — K649 Unspecified hemorrhoids: Secondary | ICD-10-CM | POA: Diagnosis not present

## 2016-06-09 DIAGNOSIS — G43909 Migraine, unspecified, not intractable, without status migrainosus: Secondary | ICD-10-CM | POA: Insufficient documentation

## 2016-06-09 DIAGNOSIS — K5730 Diverticulosis of large intestine without perforation or abscess without bleeding: Secondary | ICD-10-CM

## 2016-06-09 DIAGNOSIS — K219 Gastro-esophageal reflux disease without esophagitis: Secondary | ICD-10-CM | POA: Insufficient documentation

## 2016-06-09 DIAGNOSIS — Z8711 Personal history of peptic ulcer disease: Secondary | ICD-10-CM | POA: Diagnosis not present

## 2016-06-09 DIAGNOSIS — Z87891 Personal history of nicotine dependence: Secondary | ICD-10-CM | POA: Insufficient documentation

## 2016-06-09 DIAGNOSIS — K5790 Diverticulosis of intestine, part unspecified, without perforation or abscess without bleeding: Secondary | ICD-10-CM | POA: Diagnosis not present

## 2016-06-09 DIAGNOSIS — K640 First degree hemorrhoids: Secondary | ICD-10-CM | POA: Insufficient documentation

## 2016-06-09 DIAGNOSIS — Z0283 Encounter for blood-alcohol and blood-drug test: Secondary | ICD-10-CM | POA: Diagnosis not present

## 2016-06-09 DIAGNOSIS — Z79899 Other long term (current) drug therapy: Secondary | ICD-10-CM | POA: Insufficient documentation

## 2016-06-09 DIAGNOSIS — G8929 Other chronic pain: Secondary | ICD-10-CM | POA: Diagnosis not present

## 2016-06-09 HISTORY — PX: ESOPHAGOGASTRODUODENOSCOPY (EGD) WITH PROPOFOL: SHX5813

## 2016-06-09 HISTORY — PX: COLONOSCOPY WITH PROPOFOL: SHX5780

## 2016-06-09 SURGERY — ESOPHAGOGASTRODUODENOSCOPY (EGD) WITH PROPOFOL
Anesthesia: General

## 2016-06-09 MED ORDER — PROPOFOL 500 MG/50ML IV EMUL
INTRAVENOUS | Status: DC | PRN
  Administered 2016-06-09: 150 ug/kg/min via INTRAVENOUS

## 2016-06-09 MED ORDER — PROPOFOL 500 MG/50ML IV EMUL
INTRAVENOUS | Status: AC
  Filled 2016-06-09: qty 50

## 2016-06-09 MED ORDER — PROPOFOL 10 MG/ML IV BOLUS
INTRAVENOUS | Status: DC | PRN
  Administered 2016-06-09: 30 mg via INTRAVENOUS
  Administered 2016-06-09: 50 mg via INTRAVENOUS

## 2016-06-09 MED ORDER — LIDOCAINE HCL (PF) 1 % IJ SOLN
INTRAMUSCULAR | Status: AC
  Administered 2016-06-09: 0.3 mL via INTRADERMAL
  Filled 2016-06-09: qty 2

## 2016-06-09 MED ORDER — GLYCOPYRROLATE 0.2 MG/ML IJ SOLN
INTRAMUSCULAR | Status: AC
  Filled 2016-06-09: qty 1

## 2016-06-09 MED ORDER — SODIUM CHLORIDE 0.9 % IV SOLN
INTRAVENOUS | Status: DC
  Administered 2016-06-09: 1000 mL via INTRAVENOUS
  Administered 2016-06-09: 12:00:00 via INTRAVENOUS

## 2016-06-09 MED ORDER — FENTANYL CITRATE (PF) 100 MCG/2ML IJ SOLN
INTRAMUSCULAR | Status: AC
  Filled 2016-06-09: qty 2

## 2016-06-09 MED ORDER — LIDOCAINE HCL (PF) 2 % IJ SOLN
INTRAMUSCULAR | Status: AC
  Filled 2016-06-09: qty 2

## 2016-06-09 MED ORDER — LIDOCAINE HCL (CARDIAC) 20 MG/ML IV SOLN
INTRAVENOUS | Status: DC | PRN
  Administered 2016-06-09: 60 mg via INTRATRACHEAL

## 2016-06-09 MED ORDER — GLYCOPYRROLATE 0.2 MG/ML IJ SOLN
INTRAMUSCULAR | Status: DC | PRN
  Administered 2016-06-09: 0.2 mg via INTRAVENOUS

## 2016-06-09 MED ORDER — FENTANYL CITRATE (PF) 100 MCG/2ML IJ SOLN
INTRAMUSCULAR | Status: DC | PRN
  Administered 2016-06-09 (×2): 50 ug via INTRAVENOUS

## 2016-06-09 MED ORDER — LIDOCAINE HCL (PF) 1 % IJ SOLN
2.0000 mL | Freq: Once | INTRAMUSCULAR | Status: AC
  Administered 2016-06-09: 0.3 mL via INTRADERMAL

## 2016-06-09 NOTE — Anesthesia Postprocedure Evaluation (Signed)
Anesthesia Post Note  Patient: Lindsey Ward  Procedure(s) Performed: Procedure(s) (LRB): ESOPHAGOGASTRODUODENOSCOPY (EGD) WITH PROPOFOL (N/A) COLONOSCOPY WITH PROPOFOL (N/A)  Patient location during evaluation: PACU Anesthesia Type: General Level of consciousness: awake and alert Pain management: pain level controlled Vital Signs Assessment: post-procedure vital signs reviewed and stable Respiratory status: spontaneous breathing, nonlabored ventilation, respiratory function stable and patient connected to nasal cannula oxygen Cardiovascular status: blood pressure returned to baseline and stable Postop Assessment: no signs of nausea or vomiting Anesthetic complications: no     Last Vitals:  Vitals:   06/09/16 1223 06/09/16 1233  BP: 132/67 (!) 142/62  Pulse: 67 65  Resp: 15 11  Temp:      Last Pain:  Vitals:   06/09/16 1203  TempSrc: Tympanic  PainSc:                  Molli Barrows

## 2016-06-09 NOTE — Anesthesia Procedure Notes (Signed)
Performed by: Nassir Neidert Pre-anesthesia Checklist: Patient identified, Emergency Drugs available, Suction available, Patient being monitored and Timeout performed Patient Re-evaluated:Patient Re-evaluated prior to inductionOxygen Delivery Method: Nasal cannula Preoxygenation: Pre-oxygenation with 100% oxygen Intubation Type: IV induction       

## 2016-06-09 NOTE — Op Note (Signed)
[facility] Gastroenterology Patient Name: Lindsey Ward Procedure Date: 06/09/2016 11:09 AM MRN: [id number] Account #: [id number] Date of Birth: 07/02/1950 Admit Type: Outpatient Age: 66 Room: [facility] ENDO ROOM 4 Gender: Female Note Status: Finalized Procedure:            Upper GI endoscopy Indications:          Iron deficiency anemia Providers:            Jonathon Bellows MD, MD Referring MD:         Venia Carbon (Referring MD) Medicines:            Monitored Anesthesia Care Complications:        No immediate complications. Procedure:            Pre-Anesthesia Assessment:                       - Prior to the procedure, a History and Physical was                        performed, and patient medications, allergies and                        sensitivities were reviewed. The patient's tolerance of                        previous anesthesia was reviewed.                       - The risks and benefits of the procedure and the                        sedation options and risks were discussed with the                        patient. All questions were answered and informed                        consent was obtained.                       - ASA Grade Assessment: III - A patient with severe                        systemic disease.                       After obtaining informed consent, the endoscope was                        passed under direct vision. Throughout the procedure,                        the patient's blood pressure, pulse, and oxygen                        saturations were monitored continuously. The Endoscope                        was introduced through the mouth, and advanced to the  third part of duodenum. The upper GI endoscopy was                        accomplished with ease. The patient tolerated the                        procedure well. Findings:      The stomach was normal.      The esophagus was normal.      The examined  duodenum was normal. Biopsies for histology were taken with       a cold forceps for evaluation of celiac disease. Impression:           - Normal stomach.                       - Normal esophagus.                       - Normal examined duodenum. Biopsied. Recommendation:       - Await pathology results.                       - Perform a colonoscopy today. Procedure Code(s):    --- Professional ---                       [phone], Esophagogastroduodenoscopy, flexible, transoral;                        with biopsy, single or multiple Diagnosis Code(s):    --- Professional ---                       D50.9, Iron deficiency anemia, unspecified CPT copyright 2016 American Medical Association. All rights reserved. The codes documented in this report are preliminary and upon coder review may  be revised to meet current compliance requirements. Jonathon Bellows, MD Jonathon Bellows MD, MD 06/09/2016 11:29:18 AM This report has been signed electronically. Number of Addenda: 0 Note Initiated On: 06/09/2016 11:09 AM      [facility]

## 2016-06-09 NOTE — Transfer of Care (Signed)
Immediate Anesthesia Transfer of Care Note  Patient: Lindsey Ward  Procedure(s) Performed: Procedure(s): ESOPHAGOGASTRODUODENOSCOPY (EGD) WITH PROPOFOL (N/A) COLONOSCOPY WITH PROPOFOL (N/A)  Patient Location: PACU  Anesthesia Type:General  Level of Consciousness: sedated and responds to stimulation  Airway & Oxygen Therapy: Patient Spontanous Breathing and Patient connected to nasal cannula oxygen  Post-op Assessment: Report given to RN and Post -op Vital signs reviewed and stable  Post vital signs: Reviewed and stable  Last Vitals:  Vitals:   06/09/16 1203 06/09/16 1206  BP: 111/60 111/60  Pulse: 67 67  Resp: 14 11  Temp: (!) 35.9 C     Last Pain:  Vitals:   06/09/16 1203  TempSrc: Tympanic  PainSc:          Complications: No apparent anesthesia complications

## 2016-06-09 NOTE — Anesthesia Post-op Follow-up Note (Cosign Needed)
Anesthesia QCDR form completed.        

## 2016-06-09 NOTE — Anesthesia Preprocedure Evaluation (Signed)
Anesthesia Evaluation  Patient identified by MRN, date of birth, ID band Patient awake    Reviewed: Allergy & Precautions, H&P , NPO status , Patient's Chart, lab work & pertinent test results, reviewed documented beta blocker date and time   Airway Mallampati: II   Neck ROM: full    Dental  (+) Poor Dentition   Pulmonary neg pulmonary ROS, former smoker,    Pulmonary exam normal        Cardiovascular negative cardio ROS Normal cardiovascular exam Rhythm:regular Rate:Normal     Neuro/Psych  Headaches, PSYCHIATRIC DISORDERS Depression negative neurological ROS  negative psych ROS   GI/Hepatic negative GI ROS, Neg liver ROS, PUD, GERD  ,  Endo/Other  negative endocrine ROS  Renal/GU negative Renal ROS  negative genitourinary   Musculoskeletal negative musculoskeletal ROS (+) Fibromyalgia -  Abdominal   Peds negative pediatric ROS (+)  Hematology negative hematology ROS (+) anemia ,   Anesthesia Other Findings Past Medical History: No date: Bowel obstruction No date: Chronic fatigue No date: Depression No date: Fibromyalgia No date: GERD (gastroesophageal reflux disease) No date: Hyperlipemia No date: Migraine No date: Obstipation No date: Osteopenia No date: PUD (peptic ulcer disease) Past Surgical History: No date: ABDOMINAL SURGERY 2005: APPENDECTOMY 1999: BREAST BIOPSY Bilateral     Comment: rt w/out clip - neg, lt w/clip - neg 2003: BREAST CYST ASPIRATION Right     Comment: neg 2004: CHOLECYSTECTOMY 1986: TOTAL ABDOMINAL HYSTERECTOMY W/ BILATERAL SALP* BMI    Body Mass Index:  23.56 kg/m     Reproductive/Obstetrics negative OB ROS                             Anesthesia Physical Anesthesia Plan  ASA: III  Anesthesia Plan: General   Post-op Pain Management:    Induction:   Airway Management Planned:   Additional Equipment:   Intra-op Plan:   Post-operative  Plan:   Informed Consent: I have reviewed the patients History and Physical, chart, labs and discussed the procedure including the risks, benefits and alternatives for the proposed anesthesia with the patient or authorized representative who has indicated his/her understanding and acceptance.   Dental Advisory Given  Plan Discussed with: CRNA  Anesthesia Plan Comments:         Anesthesia Quick Evaluation

## 2016-06-09 NOTE — H&P (Signed)
Lindsey Lindsey Ward [address], [place], [place] [address] Phone: [phone] Fax : [phone]  Primary Care Physician:  Viviana Simpler, Ward Primary Gastroenterologist:  Dr. Jonathon Ward   Pre-Procedure History & Physical: HPI:  Lindsey Ward is a 66 y.o. female is here for an endoscopy and colonoscopy.   Past Medical History:  Diagnosis Date  . Bowel obstruction   . Chronic fatigue   . Depression   . Fibromyalgia   . GERD (gastroesophageal reflux disease)   . Hyperlipemia   . Migraine   . Obstipation   . Osteopenia   . PUD (peptic ulcer disease)     Past Surgical History:  Procedure Laterality Date  . ABDOMINAL SURGERY    . APPENDECTOMY  2005  . BREAST BIOPSY Bilateral 1999   rt w/out clip - neg, lt w/clip - neg  . BREAST CYST ASPIRATION Right 2003   neg  . CHOLECYSTECTOMY  2004  . TOTAL ABDOMINAL HYSTERECTOMY W/ BILATERAL SALPINGOOPHORECTOMY  1986    Prior to Admission medications   Medication Sig Start Date End Date Taking? Authorizing Provider  ALPRAZolam Duanne Moron) 1 MG tablet TAKE 1 TABLET BY MOUTH 3 TIMES A DAY AS NEEDED 05/23/16   Venia Carbon, Ward  cetirizine (ZYRTEC) 10 MG tablet TAKE 1 TABLET BY MOUTH EVERY DAY 05/23/16   Venia Carbon, Ward  cholecalciferol (VITAMIN D) 1000 UNITS tablet Take 1,000 Units by mouth daily. Reported on 09/21/2015    Historical Provider, Ward  ferrous sulfate 325 (65 FE) MG tablet Take 1 tablet (325 mg total) by mouth daily with breakfast. Patient taking differently: Take 650 mg by mouth daily with breakfast.  05/18/16   Amy Cletis [place], Ward  fluticasone (FLONASE ALLERGY RELIEF) 50 MCG/ACT nasal spray Place 2 sprays into both nostrils daily.    Historical Provider, Ward  gabapentin (NEURONTIN) 600 MG tablet TAKE 4 TABLETS BY MOUTH AT BEDTIME 05/23/16   Venia Carbon, Ward  HYDROcodone-acetaminophen (NORCO/VICODIN) 5-325 MG tablet Take 1 tablet by mouth 3 (three) times daily as needed. 06/08/16   Venia Carbon, Ward  methocarbamol  (ROBAXIN) 500 MG tablet Take 1 tablet (500 mg total) by mouth at bedtime. Patient not taking: Reported on 05/25/2016 05/06/16   Venia Carbon, Ward  omeprazole (PRILOSEC) 20 MG capsule Take 1 capsule (20 mg total) by mouth 2 (two) times daily. 05/06/16   Venia Carbon, Ward  polyethylene glycol powder (GLYCOLAX/MIRALAX) powder Take 255 g by mouth daily. 05/25/16   Lindsey Bellows, Ward  Probiotic Product (PROBIOTIC DAILY PO) Take by mouth.    Historical Provider, Ward  SUMAtriptan (IMITREX) 50 MG tablet Take 0.5-1 tablets (25-50 mg total) by mouth daily as needed for migraine. 12/24/15   Venia Carbon, Ward    Allergies as of 05/25/2016 - Review Complete 05/25/2016  Allergen Reaction Noted  . Tizanidine Other (See Comments) 10/24/2014  . Aspirin  01/10/2006  . Buspirone hcl  04/12/2006  . Ciprofloxacin  04/12/2006  . Codeine  07/25/2008  . Diazepam  01/10/2006  . Duloxetine  01/10/2006  . Ibuprofen  01/10/2006  . Oxycodone-acetaminophen  04/12/2006  . Pregabalin    . Propoxyphene hcl  01/10/2006  . Risperidone  01/10/2006  . Sulfonamide derivatives  04/12/2006  . Tramadol hcl  01/11/2008  . Venlafaxine  04/12/2006    Family History  Problem Relation Age of Onset  . Heart disease Mother     AMI  . Stroke Mother   . Heart disease  Father   . Hypertension Sister   . Breast cancer Sister   . Hypertension Sister   . Heart disease Sister     MI  . Diabetes Sister   . Kidney cancer Neg Hx   . Kidney disease Neg Hx   . Prostate cancer Neg Hx     Social History   Social History  . Marital status: Single    Spouse name: N/A  . Number of children: 1  . Years of education: N/A   Occupational History  . DISABLED    Social History Main Topics  . Smoking status: Former Smoker    Packs/day: 1.00    Years: 40.00    Types: Cigarettes    Quit date: 09/25/2013  . Smokeless tobacco: Never Used     Comment:    . Alcohol use 0.0 oz/week     Comment: rarely  . Drug use: No  . Sexual  activity: Not on file   Other Topics Concern  . Not on file   Social History Narrative   Living alone again      No living will   Daughter should make health care decisions   Requests DNR---done 10/24/14   No tube feeds if cognitively unaware    Review of Systems: See HPI, otherwise negative ROS  Physical Exam: BP 139/61   Pulse 77   Temp (!) 96.8 F (36 C) (Tympanic)   Resp 17   Ht 5\' 3"  (1.6 m)   Wt 133 lb (60.3 kg)   SpO2 99%   BMI 23.56 kg/m  General:   Alert,  pleasant and cooperative in NAD Head:  Normocephalic and atraumatic. Neck:  Supple; no masses or thyromegaly. Lungs:  Clear throughout to auscultation.    Heart:  Regular rate and rhythm. Abdomen:  Soft, nontender and nondistended. Normal bowel sounds, without guarding, and without rebound.   Neurologic:  Alert and  oriented x4;  grossly normal neurologically.  Impression/Plan: [name] is here for an endoscopy and colonoscopy to be performed for iron deficiency anemia   Risks, benefits, limitations, and alternatives regarding  endoscopy and colonoscopy have been reviewed with the patient.  Questions have been answered.  All parties agreeable.   Lindsey Bellows, Ward  06/09/2016, 10:48 AM

## 2016-06-09 NOTE — Op Note (Signed)
[facility] Gastroenterology Patient Name: Lindsey Ward Procedure Date: 06/09/2016 11:08 AM MRN: [id number] Account #: [id number] Date of Birth: 03/10/1951 Admit Type: Outpatient Age: 66 Room: [facility] ENDO ROOM 4 Gender: Female Note Status: Finalized Procedure:            Colonoscopy Indications:          Iron deficiency anemia Providers:            Jonathon Bellows MD, MD Medicines:            Monitored Anesthesia Care Complications:        No immediate complications. Procedure:            Pre-Anesthesia Assessment:                       - Prior to the procedure, a History and Physical was                        performed, and patient medications, allergies and                        sensitivities were reviewed. The patient's tolerance of                        previous anesthesia was reviewed.                       - The risks and benefits of the procedure and the                        sedation options and risks were discussed with the                        patient. All questions were answered and informed                        consent was obtained.                       - After reviewing the risks and benefits, the patient                        was deemed in satisfactory condition to undergo the                        procedure.                       - ASA Grade Assessment: III - A patient with severe                        systemic disease.                       After obtaining informed consent, the colonoscope was                        passed under direct vision. Throughout the procedure,                        the patient's blood pressure, pulse, and oxygen  saturations were monitored continuously. The                        Colonoscope was introduced through the anus and                        advanced to the the cecum, identified by the                        appendiceal orifice, IC valve and transillumination.                         The patient tolerated the procedure well. The quality                        of the bowel preparation was good. The colonoscopy was                        technically difficult and complex due to a tortuous                        colon. Successful completion of the procedure was aided                        by using manual pressure. Findings:      The perianal and digital rectal examinations were normal.      Multiple medium-mouthed diverticula were found in the entire colon.      Non-bleeding internal hemorrhoids were found during retroflexion. The       hemorrhoids were small and Grade I (internal hemorrhoids that do not       prolapse). Impression:           - Diverticulosis in the entire examined colon.                       - Non-bleeding internal hemorrhoids.                       - No specimens collected. Recommendation:       - Discharge patient to home (with escort).                       - Resume previous diet.                       - Continue present medications.                       - To visualize the small bowel, perform video capsule                        endoscopy in 2 weeks. Procedure Code(s):    --- Professional ---                       [phone], Colonoscopy, flexible; diagnostic, including                        collection of specimen(s) by brushing or washing, when                        performed (separate procedure) Diagnosis  Code(s):    --- Professional ---                       K64.0, First degree hemorrhoids                       D50.9, Iron deficiency anemia, unspecified                       K57.30, Diverticulosis of large intestine without                        perforation or abscess without bleeding CPT copyright 2016 American Medical Association. All rights reserved. The codes documented in this report are preliminary and upon coder review may  be revised to meet current compliance requirements. Jonathon Bellows, MD Jonathon Bellows MD, MD 06/09/2016 12:06:52  PM This report has been signed electronically. Number of Addenda: 0 Note Initiated On: 06/09/2016 11:08 AM Total Procedure Duration: 0 hours 26 minutes 14 seconds       [facility]

## 2016-06-10 ENCOUNTER — Encounter: Payer: Self-pay | Admitting: Gastroenterology

## 2016-06-10 LAB — SURGICAL PATHOLOGY

## 2016-06-13 LAB — TOXASSURE SELECT 13 (MW), URINE

## 2016-06-14 ENCOUNTER — Telehealth: Payer: Self-pay

## 2016-06-14 ENCOUNTER — Ambulatory Visit: Payer: Medicare Other | Admitting: Oncology

## 2016-06-14 NOTE — Telephone Encounter (Signed)
-----   Message from Jonathon Bellows, MD sent at 06/13/2016 11:50 AM EDT ----- Normal duodenal bx

## 2016-06-14 NOTE — Telephone Encounter (Signed)
Spoke with patient.   Per Dr. Vicente Males, Normal duodenal bx

## 2016-06-15 ENCOUNTER — Inpatient Hospital Stay: Payer: Medicare Other | Attending: Oncology

## 2016-06-15 VITALS — BP 116/65 | HR 70 | Resp 20

## 2016-06-15 DIAGNOSIS — Z79899 Other long term (current) drug therapy: Secondary | ICD-10-CM | POA: Diagnosis not present

## 2016-06-15 DIAGNOSIS — D508 Other iron deficiency anemias: Secondary | ICD-10-CM

## 2016-06-15 DIAGNOSIS — D509 Iron deficiency anemia, unspecified: Secondary | ICD-10-CM | POA: Diagnosis not present

## 2016-06-15 MED ORDER — SODIUM CHLORIDE 0.9 % IV SOLN
Freq: Once | INTRAVENOUS | Status: DC
  Administered 2016-06-15: 14:00:00 via INTRAVENOUS
  Filled 2016-06-15: qty 1000

## 2016-06-15 MED ORDER — FERUMOXYTOL INJECTION 510 MG/17 ML
510.0000 mg | Freq: Once | INTRAVENOUS | Status: DC
  Administered 2016-06-15: 510 mg via INTRAVENOUS
  Filled 2016-06-15: qty 17

## 2016-06-17 ENCOUNTER — Other Ambulatory Visit: Payer: Self-pay

## 2016-06-17 ENCOUNTER — Telehealth: Payer: Self-pay | Admitting: Gastroenterology

## 2016-06-17 NOTE — Telephone Encounter (Signed)
Patient called and she has two iron infusions and still isn't feeling better. Still very low energy. What else can she do for more energy?

## 2016-06-20 ENCOUNTER — Other Ambulatory Visit
Admission: RE | Admit: 2016-06-20 | Discharge: 2016-06-20 | Disposition: A | Discharge status: Home / Self Care | Payer: Medicare Other | Source: Ambulatory Visit | Attending: Gastroenterology | Admitting: Gastroenterology

## 2016-06-20 ENCOUNTER — Other Ambulatory Visit: Payer: Self-pay

## 2016-06-20 ENCOUNTER — Telehealth: Payer: Self-pay

## 2016-06-20 DIAGNOSIS — D509 Iron deficiency anemia, unspecified: Secondary | ICD-10-CM | POA: Insufficient documentation

## 2016-06-20 LAB — CBC WITH DIFFERENTIAL/PLATELET
Basophils Absolute: 0 10*3/uL (ref 0–0.1)
Basophils Relative: 1 %
Eosinophils Absolute: 0.2 10*3/uL (ref 0–0.7)
Eosinophils Relative: 3 %
HCT: 36.1 % (ref 35.0–47.0)
Hemoglobin: 11.9 g/dL — ABNORMAL LOW (ref 12.0–16.0)
Lymphocytes Relative: 26 %
Lymphs Abs: 1.8 10*3/uL (ref 1.0–3.6)
MCH: 27.9 pg (ref 26.0–34.0)
MCHC: 32.9 g/dL (ref 32.0–36.0)
MCV: 84.9 fL (ref 80.0–100.0)
Monocytes Absolute: 0.6 10*3/uL (ref 0.2–0.9)
Monocytes Relative: 8 %
Neutro Abs: 4.4 10*3/uL (ref 1.4–6.5)
Neutrophils Relative %: 62 %
Platelets: 236 10*3/uL (ref 150–440)
RBC: 4.25 MIL/uL (ref 3.80–5.20)
RDW: 22.8 % — ABNORMAL HIGH (ref 11.5–14.5)
WBC: 7.1 10*3/uL (ref 3.6–11.0)

## 2016-06-20 NOTE — Telephone Encounter (Signed)
Blood transfusion will be decided based on cbc

## 2016-06-20 NOTE — Telephone Encounter (Signed)
Pt returned your call. She is requesting a cb.

## 2016-06-20 NOTE — Telephone Encounter (Signed)
Spoke to pt. She will discuss more with the doctor.

## 2016-06-20 NOTE — Telephone Encounter (Signed)
Please let her know that it would be a good idea to do the capsule study if still having problems. It is very simple, she swallows the capsule with a camera, it takes pictures as it goes through, and then she just has to recover it when it passes.

## 2016-06-20 NOTE — Telephone Encounter (Signed)
Left message on voicemail for patient to call back. 

## 2016-06-20 NOTE — Telephone Encounter (Signed)
Pt has spoken with Dr Georgeann Oppenheim office;pt still feeling dizzy and not feeling any better;Pt has been advised to have capsule study which is scheduled for 06/23/16. Pt wants Dr Everardo Beals opinion if he thinks she needs to have capsule study done. Pt request cb. See phone note of Dr Chase Caller on 06/17/16.

## 2016-06-20 NOTE — Telephone Encounter (Signed)
1. Needs repeat CBC-   What exactly she means by "not feeling better" is it malaise, fatigue or something else? 2. Has capsule study been scheduled ?  Also will need follow up after capsule

## 2016-06-20 NOTE — Telephone Encounter (Signed)
Advised pt of possible orthostatic changes causing hypotension.   No blood transfusion per Dr. Vicente Males.

## 2016-06-22 ENCOUNTER — Telehealth: Payer: Self-pay | Admitting: Gastroenterology

## 2016-06-22 NOTE — Telephone Encounter (Signed)
What is the schedule to take Miralax? She needs to talk to you today please.

## 2016-06-23 ENCOUNTER — Encounter: Payer: Self-pay | Admitting: Anesthesiology

## 2016-06-23 ENCOUNTER — Encounter
Admission: RE | Disposition: A | Discharge status: Home / Self Care | Payer: Self-pay | Source: Ambulatory Visit | Attending: Gastroenterology

## 2016-06-23 ENCOUNTER — Ambulatory Visit
Admission: RE | Admit: 2016-06-23 | Discharge: 2016-06-23 | Disposition: A | Discharge status: Home / Self Care | Payer: Medicare Other | Source: Ambulatory Visit | Attending: Gastroenterology | Admitting: Gastroenterology

## 2016-06-23 DIAGNOSIS — D509 Iron deficiency anemia, unspecified: Secondary | ICD-10-CM | POA: Insufficient documentation

## 2016-06-23 DIAGNOSIS — K259 Gastric ulcer, unspecified as acute or chronic, without hemorrhage or perforation: Secondary | ICD-10-CM | POA: Diagnosis not present

## 2016-06-23 HISTORY — PX: GIVENS CAPSULE STUDY: SHX5432

## 2016-06-23 SURGERY — IMAGING PROCEDURE, GI TRACT, INTRALUMINAL, VIA CAPSULE
Anesthesia: General

## 2016-06-24 ENCOUNTER — Encounter: Payer: Self-pay | Admitting: Gastroenterology

## 2016-06-27 ENCOUNTER — Telehealth: Payer: Self-pay | Admitting: Gastroenterology

## 2016-06-27 NOTE — Telephone Encounter (Signed)
Patient left a voice message regarding results

## 2016-07-05 ENCOUNTER — Ambulatory Visit
Admission: RE | Admit: 2016-07-05 | Discharge: 2016-07-05 | Disposition: A | Discharge status: Home / Self Care | Payer: Medicare Other | Source: Ambulatory Visit | Attending: Gastroenterology | Admitting: Gastroenterology

## 2016-07-05 ENCOUNTER — Inpatient Hospital Stay: Admit: 2016-07-05 | Payer: Self-pay

## 2016-07-05 ENCOUNTER — Telehealth: Payer: Self-pay

## 2016-07-05 ENCOUNTER — Other Ambulatory Visit: Payer: Self-pay

## 2016-07-05 ENCOUNTER — Telehealth: Payer: Self-pay | Admitting: Gastroenterology

## 2016-07-05 DIAGNOSIS — D509 Iron deficiency anemia, unspecified: Secondary | ICD-10-CM | POA: Insufficient documentation

## 2016-07-05 DIAGNOSIS — D649 Anemia, unspecified: Secondary | ICD-10-CM | POA: Diagnosis not present

## 2016-07-05 DIAGNOSIS — R140 Abdominal distension (gaseous): Secondary | ICD-10-CM | POA: Insufficient documentation

## 2016-07-05 DIAGNOSIS — K5900 Constipation, unspecified: Secondary | ICD-10-CM

## 2016-07-05 NOTE — Telephone Encounter (Signed)
Advised pt of KUB at [facility] to ensure capsule passed.   Pt states she didn't see it.

## 2016-07-05 NOTE — Telephone Encounter (Signed)
Patient is looking for results °

## 2016-07-06 ENCOUNTER — Ambulatory Visit: Payer: Medicare Other

## 2016-07-06 ENCOUNTER — Telehealth: Payer: Self-pay

## 2016-07-06 ENCOUNTER — Other Ambulatory Visit: Payer: Self-pay

## 2016-07-06 DIAGNOSIS — D509 Iron deficiency anemia, unspecified: Secondary | ICD-10-CM

## 2016-07-06 NOTE — Telephone Encounter (Signed)
Spoke with pt in reference to foul smelling urine. Pt denied n/v, f/c, dysuria. Pt stated that her only symptom is foul smelling urine. Pt then stated that she only drinks 3 or less bottles of water a day. Reinforced with pt to drink at least 6-8 bottles of water a day and foul smell of urine should go away. Reinforced with pt if she develops n/v, f/c, dysuria to call back. Pt voiced understanding of whole conversation.

## 2016-07-07 ENCOUNTER — Encounter: Payer: Self-pay | Admitting: Neurology

## 2016-07-07 ENCOUNTER — Ambulatory Visit (INDEPENDENT_AMBULATORY_CARE_PROVIDER_SITE_OTHER): Payer: Medicare Other | Admitting: Neurology

## 2016-07-07 VITALS — BP 108/78 | HR 64 | Ht 62.5 in | Wt 138.0 lb

## 2016-07-07 DIAGNOSIS — G5712 Meralgia paresthetica, left lower limb: Secondary | ICD-10-CM | POA: Diagnosis not present

## 2016-07-07 NOTE — Patient Instructions (Signed)
1.  Weight loss encouraged 2.  Avoid wearing tight constrictive clothing around the waist

## 2016-07-07 NOTE — Progress Notes (Signed)
[facility] Clinic Note - Initial Visit   Date: 07/07/16  Lindsey Ward MRN: [id number] DOB: 14-Apr-1950   Dear Dr. Silvio Pate:  Thank you for your kind referral of Lindsey Ward for consultation of paresthesias. Although her history is well known to you, please allow [place] to reiterate it for the purpose of our medical record. The patient was accompanied to the clinic by self.   History of Present Illness: Lindsey Ward is a 66 y.o. right-handed Caucasian female with fibromyalgia, depression, GERD, former smoker, iron deficiency anemia, and migraine presenting for evaluation of paresthesias.  She was referred for bilateral leg numbness/tingling.  However, she reports this only occurred for about a month and she does not have any more symptoms related to this. She attributes improvement to starting Robaxin. Due to skin rash, she stopped robaxin about a month ago, but still has not had any more discomfort related to numbness/tingling of the feet.  Her new complaint is left thigh numbness, which started around March.  Symptoms have been constant since onset.  It does not involve her medial thigh or past her knee.  No associated weakness or similar symptoms on the right.  She has gained 23lb over the past year, which she attributes to being more sedentary.  She endorses wearing Spanx for tummy control, which does tend to be constrictive around her thighs.    Out-side paper records, electronic medical record, and images have been reviewed where available and summarized as:  Lab Results  Component Value Date   TSH 1.329 05/13/2016   Lab Results  Component Value Date   [id number] 124 05/17/2016   CT lumbar spine wo contrast 06/12/2014:  Mild lumbar spondylosis for age.  No acute osseous abnormality.   MRI cervical spine wo contrast 04/01/2014:  Negative for central canal stenosis. Uncovertebral disease at C4-5,  C5-6 and C6-7 causes mild to moderate foraminal narrowing  as  described above.   Past Medical History:  Diagnosis Date  . Bowel obstruction ([place])   . Chronic fatigue   . Depression   . Fibromyalgia   . GERD (gastroesophageal reflux disease)   . Hyperlipemia   . Migraine   . Obstipation   . Osteopenia   . PUD (peptic ulcer disease)     Past Surgical History:  Procedure Laterality Date  . ABDOMINAL SURGERY    . APPENDECTOMY  2005  . BREAST BIOPSY Bilateral 1999   rt w/out clip - neg, lt w/clip - neg  . BREAST CYST ASPIRATION Right 2003   neg  . CHOLECYSTECTOMY  2004  . COLONOSCOPY WITH PROPOFOL N/A 06/09/2016   Procedure: COLONOSCOPY WITH PROPOFOL;  Surgeon: Jonathon Bellows, MD;  Location: [facility] ENDOSCOPY;  Service: Endoscopy;  Laterality: N/A;  . ESOPHAGOGASTRODUODENOSCOPY (EGD) WITH PROPOFOL N/A 06/09/2016   Procedure: ESOPHAGOGASTRODUODENOSCOPY (EGD) WITH PROPOFOL;  Surgeon: Jonathon Bellows, MD;  Location: [facility] ENDOSCOPY;  Service: Endoscopy;  Laterality: N/A;  . GIVENS CAPSULE STUDY N/A 06/23/2016   Procedure: GIVENS CAPSULE STUDY;  Surgeon: Jonathon Bellows, MD;  Location: [facility] ENDOSCOPY;  Service: Endoscopy;  Laterality: N/A;  . TOTAL ABDOMINAL HYSTERECTOMY W/ BILATERAL SALPINGOOPHORECTOMY  1986     Medications:  Outpatient Encounter Prescriptions as of 07/07/2016  Medication Sig  . ALPRAZolam (XANAX) 1 MG tablet TAKE 1 TABLET BY MOUTH 3 TIMES A DAY AS NEEDED  . cetirizine (ZYRTEC) 10 MG tablet TAKE 1 TABLET BY MOUTH EVERY DAY  . cholecalciferol (VITAMIN D) 1000 UNITS tablet Take 1,000 Units by mouth  daily. Reported on 09/21/2015  . ferrous sulfate 325 (65 FE) MG tablet Take 1 tablet (325 mg total) by mouth daily with breakfast. (Patient taking differently: Take 650 mg by mouth daily with breakfast. )  . fluticasone (FLONASE ALLERGY RELIEF) 50 MCG/ACT nasal spray Place 2 sprays into both nostrils daily.  Marland Kitchen gabapentin (NEURONTIN) 600 MG tablet TAKE 4 TABLETS BY MOUTH AT BEDTIME  . HYDROcodone-acetaminophen (NORCO/VICODIN) 5-325 MG tablet Take 1  tablet by mouth 3 (three) times daily as needed.  Marland Kitchen omeprazole (PRILOSEC) 20 MG capsule Take 1 capsule (20 mg total) by mouth 2 (two) times daily.  . polyethylene glycol powder (GLYCOLAX/MIRALAX) powder Take 255 g by mouth daily.  . Probiotic Product (PROBIOTIC DAILY PO) Take by mouth.  . SUMAtriptan (IMITREX) 50 MG tablet Take 0.5-1 tablets (25-50 mg total) by mouth daily as needed for migraine.  . triamcinolone cream (KENALOG) 0.1 % APPLY TO INFLAMED LESIONS TWICE DAILY FOR 1 WEEK  . [DISCONTINUED] methocarbamol (ROBAXIN) 500 MG tablet Take 1 tablet (500 mg total) by mouth at bedtime. (Patient not taking: Reported on 05/25/2016)   No facility-administered encounter medications on file as of 07/07/2016.      Allergies:  Allergies  Allergen Reactions  . Tizanidine Other (See Comments)    "throws me in a different world"  . Aspirin     REACTION: GI bleed  . Buspirone Hcl     REACTION: unspecified  . Ciprofloxacin     REACTION: unspecified  . Codeine   . Diazepam   . Duloxetine     REACTION: sz like activity  . Ibuprofen     REACTION: GI bleed  . Oxycodone-Acetaminophen     REACTION: unspecified  . Pregabalin     REACTION: kept her up and confusion  . Propoxyphene Hcl   . Risperidone     REACTION: confusion  . Sulfonamide Derivatives     REACTION: unspecified  . Tramadol Hcl     REACTION: doesn't remember what happened but couldn't tolerate  . Venlafaxine     REACTION: unspecified  . Methocarbamol Rash    Family History: Family History  Problem Relation Age of Onset  . Heart disease Mother     AMI  . Stroke Mother   . Heart disease Father   . Hypertension Sister   . Breast cancer Sister   . Hypertension Sister   . Heart disease Sister     MI  . Diabetes Sister   . Kidney cancer Neg Hx   . Kidney disease Neg Hx   . Prostate cancer Neg Hx     Social History: Social History  Substance Use Topics  . Smoking status: Former Smoker    Packs/day: 1.00    Years:  40.00    Types: Cigarettes    Quit date: 09/25/2013  . Smokeless tobacco: Never Used     Comment:    . Alcohol use 0.0 oz/week     Comment: rarely   Social History   Social History Narrative   Living alone again      No living will   Daughter should make health care decisions   Requests DNR---done 10/24/14   No tube feeds if cognitively unaware   Highest level of education: 9th       Review of Systems:  CONSTITUTIONAL: No fevers, chills, night sweats, or weight loss.   EYES: No visual changes or eye pain ENT: No hearing changes.  No history of nose bleeds.   RESPIRATORY: No  cough, wheezing and shortness of breath.   CARDIOVASCULAR: Negative for chest pain, and palpitations.   GI: Negative for abdominal discomfort, blood in stools or black stools.  No recent change in bowel habits.   GU:  No history of incontinence.   MUSCLOSKELETAL: No history of joint pain or swelling.  +myalgias.   SKIN: Negative for lesions, rash, and itching.   HEMATOLOGY/ONCOLOGY: Negative for prolonged bleeding, bruising easily, and swollen nodes.  No history of cancer.   ENDOCRINE: Negative for cold or heat intolerance, polydipsia or goiter.   PSYCH:  +depression or anxiety symptoms.   NEURO: As Above.   Vital Signs:  BP 108/78   Pulse 64   Ht 5' 2.5" (1.588 m)   Wt 138 lb (62.6 kg)   SpO2 93%   BMI 24.84 kg/m     General Medical Exam:   General:  Well appearing, comfortable.   Eyes/ENT: see cranial nerve examination.   Neck: No masses appreciated.  Full range of motion without tenderness.  No carotid bruits. Respiratory:  Clear to auscultation, good air entry bilaterally.   Cardiac:  Regular rate and rhythm, no murmur.   Extremities:  No deformities, edema, or skin discoloration.  Skin:  No rashes or lesions.  Neurological Exam: MENTAL STATUS including orientation to time, place, person, recent and remote memory, attention span and concentration, language, and fund of knowledge is normal.   Speech is not dysarthric.  CRANIAL NERVES: II:  No visual field defects.  Unremarkable fundi.   III-IV-VI: Pupils equal round and reactive to light.  Normal conjugate, extra-ocular eye movements in all directions of gaze.  No nystagmus.  No ptosis.   V:  Normal facial sensation.     VII:  Normal facial symmetry and movements.   VIII:  Normal hearing and vestibular function.   IX-X:  Normal palatal movement.   XI:  Normal shoulder shrug and head rotation.   XII:  Normal tongue strength and range of motion, no deviation or fasciculation.  MOTOR:  No atrophy, fasciculations or abnormal movements.  No pronator drift.  Tone is normal.    Right Upper Extremity:    Left Upper Extremity:    Deltoid  5/5   Deltoid  5/5   Biceps  5/5   Biceps  5/5   Triceps  5/5   Triceps  5/5   Wrist extensors  5/5   Wrist extensors  5/5   Wrist flexors  5/5   Wrist flexors  5/5   Finger extensors  5/5   Finger extensors  5/5   Finger flexors  5/5   Finger flexors  5/5   Dorsal interossei  5/5   Dorsal interossei  5/5   Abductor pollicis  5/5   Abductor pollicis  5/5   Tone (Ashworth scale)  0  Tone (Ashworth scale)  0   Right Lower Extremity:    Left Lower Extremity:    Hip flexors  5/5   Hip flexors  5/5   Hip extensors  5/5   Hip extensors  5/5   Knee flexors  5/5   Knee flexors  5/5   Knee extensors  5/5   Knee extensors  5/5   Dorsiflexors  5/5   Dorsiflexors  5/5   Plantarflexors  5/5   Plantarflexors  5/5   Toe extensors  5/5   Toe extensors  5/5   Toe flexors  5/5   Toe flexors  5/5   Tone (Ashworth scale)  0  Tone (Ashworth scale)  0   MSRs:  Right                                                                 Left brachioradialis 2+  brachioradialis 2+  biceps 2+  biceps 2+  triceps 2+  triceps 2+  patellar 2+  patellar 2+  ankle jerk 2+  ankle jerk 2+  Hoffman no  Hoffman no  plantar response down  plantar response down   SENSORY:  Reduced pin prick, temperature, and light tough  over the left anterolateral thigh.  Otherwise, normal and symmetric perception of light touch, pinprick, vibration, and proprioception in the feet and upper extremities.  Romberg's sign absent.   COORDINATION/GAIT: Normal finger-to- nose-finger and heel-to-shin.  Intact rapid alternating movements bilaterally.  Able to rise from a chair without using arms.  Gait narrow based and stable. Tandem and stressed gait intact.    IMPRESSION: Lindsey Ward is a 66 year-old female referred for evaluation of paresthesias of the legs.  She reports these symptoms were transient and have resolved.  There is no signs of neuropathy on her exam.  Today, she does complain of left lateral thigh numbness.  Her examination is consistent with diminished sensation in all modalities over the anterolateral thigh on the left side. Based on history of exam, she has meralgia paresthetica an entrapment of the lateral femoral cutaneous nerve as it exits below the inguinal canal. I suspect that a combination of weight gain and her tight Spanx is causing her symptoms.  Management is conservative with weight loss and avoidance of repetitive trauma to this region.  I recommend that she consider wearing longer Spanx that go down to her mid-thigh and start water exercises as this can be more tolerated by individuals with fibromyalgia and chronic pain.  If she develops burning pain, OTC lidocaine ointment can be used.  Return to clinic as needed  The duration of this appointment visit was 40 minutes of face-to-face time with the patient.  Greater than 50% of this time was spent in counseling, explanation of diagnosis, planning of further management, and coordination of care.   Thank you for allowing me to participate in patient's care.  If I can answer any additional questions, I would be pleased to do so.    Sincerely,    Cornie Herrington K. Posey Pronto, DO

## 2016-07-12 ENCOUNTER — Telehealth: Payer: Self-pay | Admitting: Gastroenterology

## 2016-07-12 NOTE — Telephone Encounter (Signed)
*  STAT* If patient is at the pharmacy, call can be transferred to refill team.   1. Which medications need to be refilled? (please list name of each medication and dose if known) Linzess  2. Which pharmacy/location (including street and city if local pharmacy) is medication to be sent to? [facility]  3. Do they need a 30 day or 90 day supply?   Patient still constipated and requesting to be put on Linzess. Thanks

## 2016-07-14 ENCOUNTER — Telehealth: Payer: Self-pay

## 2016-07-14 ENCOUNTER — Other Ambulatory Visit: Payer: Self-pay | Admitting: Internal Medicine

## 2016-07-14 ENCOUNTER — Other Ambulatory Visit: Payer: Self-pay | Admitting: Gastroenterology

## 2016-07-14 DIAGNOSIS — K5900 Constipation, unspecified: Secondary | ICD-10-CM

## 2016-07-14 MED ORDER — LINACLOTIDE 145 MCG PO CAPS: 145.0000 ug | ORAL_CAPSULE | Freq: Every day | ORAL | 0 refills | Status: DC

## 2016-07-14 NOTE — Telephone Encounter (Signed)
Advised pt of information and result status per Dr. Vicente Males.   Sent Rx for Linzess.

## 2016-07-14 NOTE — Telephone Encounter (Signed)
Last filled 05-23-16 #120 Last OV 05-17-16 Acute Next OV 09-08-16

## 2016-07-14 NOTE — Telephone Encounter (Signed)
-----   Message from Jonathon Bellows, MD sent at 07/12/2016 11:01 AM EDT ----- Regarding: RE: Capsule Study Wait and watch if not having symptoms ----- Message ----- From: Leontine Locket, CMA Sent: 07/12/2016   9:54 AM To: Jonathon Bellows, MD Subject: Capsule Study                                  Pt calling for results of capsule study.   She wants to know if another x-ray is needed. She didn't see the capsule pass.

## 2016-07-14 NOTE — Telephone Encounter (Signed)
Done

## 2016-07-14 NOTE — Telephone Encounter (Signed)
Approved: #[id number] x 0 

## 2016-07-15 ENCOUNTER — Ambulatory Visit: Payer: Medicare Other | Admitting: Neurology

## 2016-07-15 NOTE — Telephone Encounter (Signed)
Left refill on voice mail at pharmacy  

## 2016-08-02 ENCOUNTER — Telehealth: Payer: Self-pay

## 2016-08-02 NOTE — Telephone Encounter (Signed)
Pt called in stating she thinks she has a UTI. Pt described UTI s/s to be foul smelling urine. Pt denied n/v, f/c, dysuria. Reinforced with pt to drink more fluids and the foul smell should dissipate. Pt then stated that she has some left over abx from before and requested to take them. Reinforced with pt nurse is not able to advise if abx should be taken or not. Pt voiced understanding of whole understanding.

## 2016-08-09 ENCOUNTER — Other Ambulatory Visit: Payer: Self-pay | Admitting: Oncology

## 2016-08-09 NOTE — Progress Notes (Deleted)
[place]  [phone] [phone]  ID: Lindsey Ward OB: 22-Aug-1950  MR#: [id number]  BTD#:[id number]  Patient Care Team: Venia Carbon, MD as PCP - General  CHIEF COMPLAINT: Iron deficiency anemia  INTERVAL HISTORY: Patient is a 66 year old female who was noted to have decreased hemoglobin and iron stores on routine blood work. She currently complains of increased weakness and fatigue for the past 3-4 weeks. She otherwise feels well. She has no neurologic complaints. She denies any recent fevers or illnesses. She has a good appetite and denies weight loss. She has no chest pain or shortness of breath. She denies any nausea, vomiting, constipation, or diarrhea. She denies any melena or hematochezia. She has no urinary complaints. Patient otherwise feels well and offers no further specific complaints.  REVIEW OF SYSTEMS:   Review of Systems  Constitutional: Positive for malaise/fatigue. Negative for fever and weight loss.  Respiratory: Negative.  Negative for cough.   Cardiovascular: Negative.  Negative for chest pain and leg swelling.  Gastrointestinal: Negative for abdominal pain, blood in stool and melena.  Genitourinary: Negative.   Musculoskeletal: Negative.   Neurological: Positive for weakness.  Psychiatric/Behavioral: Negative.  The patient is not nervous/anxious.     As per HPI. Otherwise, a complete review of systems is negative.  PAST MEDICAL HISTORY: Past Medical History:  Diagnosis Date  . Bowel obstruction ([place])   . Chronic fatigue   . Depression   . Fibromyalgia   . GERD (gastroesophageal reflux disease)   . Hyperlipemia   . Migraine   . Obstipation   . Osteopenia   . PUD (peptic ulcer disease)     PAST SURGICAL HISTORY: Past Surgical History:  Procedure Laterality Date  . ABDOMINAL SURGERY    . APPENDECTOMY  2005  . BREAST BIOPSY Bilateral 1999   rt w/out clip - neg, lt w/clip - neg  . BREAST CYST ASPIRATION  Right 2003   neg  . CHOLECYSTECTOMY  2004  . COLONOSCOPY WITH PROPOFOL N/A 06/09/2016   Procedure: COLONOSCOPY WITH PROPOFOL;  Surgeon: Jonathon Bellows, MD;  Location: [facility] ENDOSCOPY;  Service: Endoscopy;  Laterality: N/A;  . ESOPHAGOGASTRODUODENOSCOPY (EGD) WITH PROPOFOL N/A 06/09/2016   Procedure: ESOPHAGOGASTRODUODENOSCOPY (EGD) WITH PROPOFOL;  Surgeon: Jonathon Bellows, MD;  Location: [facility] ENDOSCOPY;  Service: Endoscopy;  Laterality: N/A;  . GIVENS CAPSULE STUDY N/A 06/23/2016   Procedure: GIVENS CAPSULE STUDY;  Surgeon: Jonathon Bellows, MD;  Location: [facility] ENDOSCOPY;  Service: Endoscopy;  Laterality: N/A;  . TOTAL ABDOMINAL HYSTERECTOMY W/ BILATERAL SALPINGOOPHORECTOMY  1986    FAMILY HISTORY: Family History  Problem Relation Age of Onset  . Heart disease Mother        AMI  . Stroke Mother   . Heart disease Father   . Hypertension Sister   . Breast cancer Sister   . Hypertension Sister   . Heart disease Sister        MI  . Diabetes Sister   . Kidney cancer Neg Hx   . Kidney disease Neg Hx   . Prostate cancer Neg Hx     ADVANCED DIRECTIVES (Y/N):  N  HEALTH MAINTENANCE: Social History  Substance Use Topics  . Smoking status: Former Smoker    Packs/day: 1.00    Years: 40.00    Types: Cigarettes    Quit date: 09/25/2013  . Smokeless tobacco: Never Used     Comment:    . Alcohol use 0.0 oz/week     Comment: rarely  Colonoscopy:  PAP:  Bone density:  Lipid panel:  Allergies  Allergen Reactions  . Tizanidine Other (See Comments)    "throws me in a different world"  . Aspirin     REACTION: GI bleed  . Buspirone Hcl     REACTION: unspecified  . Ciprofloxacin     REACTION: unspecified  . Codeine   . Diazepam   . Duloxetine     REACTION: sz like activity  . Ibuprofen     REACTION: GI bleed  . Oxycodone-Acetaminophen     REACTION: unspecified  . Pregabalin     REACTION: kept her up and confusion  . Propoxyphene Hcl   . Risperidone     REACTION: confusion  .  Sulfonamide Derivatives     REACTION: unspecified  . Tramadol Hcl     REACTION: doesn't remember what happened but couldn't tolerate  . Venlafaxine     REACTION: unspecified  . Methocarbamol Rash    Current Outpatient Prescriptions  Medication Sig Dispense Refill  . ALPRAZolam (XANAX) 1 MG tablet TAKE 1 TABLET BY MOUTH 3 TIMES A DAY AS NEEDED 120 tablet 0  . cetirizine (ZYRTEC) 10 MG tablet TAKE 1 TABLET BY MOUTH EVERY DAY 30 tablet 11  . cholecalciferol (VITAMIN D) 1000 UNITS tablet Take 1,000 Units by mouth daily. Reported on 09/21/2015    . ferrous sulfate 325 (65 FE) MG tablet Take 1 tablet (325 mg total) by mouth daily with breakfast. (Patient taking differently: Take 650 mg by mouth daily with breakfast. ) 30 tablet 3  . fluticasone (FLONASE ALLERGY RELIEF) 50 MCG/ACT nasal spray Place 2 sprays into both nostrils daily.    Marland Kitchen gabapentin (NEURONTIN) 600 MG tablet TAKE 4 TABLETS BY MOUTH AT BEDTIME 360 tablet 3  . HYDROcodone-acetaminophen (NORCO/VICODIN) 5-325 MG tablet Take 1 tablet by mouth 3 (three) times daily as needed. 90 tablet 0  . linaclotide (LINZESS) 145 MCG CAPS capsule Take 1 capsule (145 mcg total) by mouth daily before breakfast. 61 capsule 0  . omeprazole (PRILOSEC) 20 MG capsule Take 1 capsule (20 mg total) by mouth 2 (two) times daily. 180 capsule 3  . polyethylene glycol powder (GLYCOLAX/MIRALAX) powder Take 255 g by mouth daily. 255 g 3  . Probiotic Product (PROBIOTIC DAILY PO) Take by mouth.    . SUMAtriptan (IMITREX) 50 MG tablet Take 0.5-1 tablets (25-50 mg total) by mouth daily as needed for migraine. 10 tablet 5  . triamcinolone cream (KENALOG) 0.1 % APPLY TO INFLAMED LESIONS TWICE DAILY FOR 1 WEEK  1   No current facility-administered medications for this visit.     OBJECTIVE: There were no vitals filed for this visit.   There is no height or weight on file to calculate BMI.    ECOG FS:0 - Asymptomatic  General: Well-developed, well-nourished, no acute  distress. Eyes: Pink conjunctiva, anicteric sclera. HEENT: Normocephalic, moist mucous membranes, clear oropharnyx. Lungs: Clear to auscultation bilaterally. Heart: Regular rate and rhythm. No rubs, murmurs, or gallops. Abdomen: Soft, nontender, nondistended. No organomegaly noted, normoactive bowel sounds. Musculoskeletal: No edema, cyanosis, or clubbing. Neuro: Alert, answering all questions appropriately. Cranial nerves grossly intact. Skin: No rashes or petechiae noted. Psych: Normal affect. Lymphatics: No cervical, calvicular, axillary or inguinal LAD.   LAB RESULTS:  Lab Results  Component Value Date   NA 133 (L) 05/13/2016   K 4.1 05/13/2016   CL 97 (L) 05/13/2016   CO2 26 05/13/2016   GLUCOSE 102 (H) 05/13/2016   BUN 13 05/13/2016  CREATININE 0.68 05/13/2016   CALCIUM 9.3 05/13/2016   PROT 7.0 05/13/2016   ALBUMIN 3.8 05/13/2016   AST 23 05/13/2016   ALT 13 (L) 05/13/2016   ALKPHOS 93 05/13/2016   BILITOT 0.4 05/13/2016   GFRNONAA >60 05/13/2016   GFRAA >60 05/13/2016    Lab Results  Component Value Date   WBC 7.1 06/20/2016   NEUTROABS 4.4 06/20/2016   HGB 11.9 (L) 06/20/2016   HCT 36.1 06/20/2016   MCV 84.9 06/20/2016   PLT 236 06/20/2016   Lab Results  Component Value Date   IRON 21 (L) 05/17/2016   IRONPCTSAT 4.7 (L) 05/17/2016   Lab Results  Component Value Date   FERRITIN 6.2 (L) 05/17/2016     STUDIES: No results found.  ASSESSMENT: Iron deficiency anemia.  PLAN:  1. Iron deficiency anemia:  Unclear etiology, but patient has colonoscopy scheduled in the next several months. Patient's hemoglobin and iron stores are decreased and she is symptomatic. Proceed with 510 mg IV Feraheme later this week and a second dose in 1 week. Patient will then return to clinic in 2 months with repeat laboratory work and further evaluation. If patient's anemia does not resolve with iron infusions, will consider a full anemia workup in the  future.  Approximately 45 minutes was spent in discussion of which greater than 50% was consultation.  Patient expressed understanding and was in agreement with this plan. She also understands that She can call clinic at any time with any questions, concerns, or complaints.    Lloyd Huger, MD   08/09/2016 10:03 PM

## 2016-08-10 ENCOUNTER — Inpatient Hospital Stay: Payer: Medicare Other

## 2016-08-10 ENCOUNTER — Inpatient Hospital Stay: Payer: Medicare Other | Admitting: Oncology

## 2016-09-07 ENCOUNTER — Ambulatory Visit: Payer: Medicare Other

## 2016-09-07 ENCOUNTER — Other Ambulatory Visit: Payer: Medicare Other

## 2016-09-07 ENCOUNTER — Ambulatory Visit: Payer: Medicare Other | Admitting: Oncology

## 2016-09-08 ENCOUNTER — Encounter: Payer: Self-pay | Admitting: Internal Medicine

## 2016-09-08 ENCOUNTER — Ambulatory Visit (INDEPENDENT_AMBULATORY_CARE_PROVIDER_SITE_OTHER): Payer: Medicare Other | Admitting: Internal Medicine

## 2016-09-08 VITALS — BP 116/70 | HR 59 | Temp 97.7°F | Wt 135.0 lb

## 2016-09-08 DIAGNOSIS — K120 Recurrent oral aphthae: Secondary | ICD-10-CM | POA: Diagnosis not present

## 2016-09-08 DIAGNOSIS — D508 Other iron deficiency anemias: Secondary | ICD-10-CM | POA: Diagnosis not present

## 2016-09-08 DIAGNOSIS — M5441 Lumbago with sciatica, right side: Secondary | ICD-10-CM

## 2016-09-08 DIAGNOSIS — G8929 Other chronic pain: Secondary | ICD-10-CM

## 2016-09-08 LAB — CBC WITH DIFFERENTIAL/PLATELET
Basophils Absolute: 0.1 10*3/uL (ref 0.0–0.1)
Basophils Relative: 0.9 % (ref 0.0–3.0)
Eosinophils Absolute: 0.2 10*3/uL (ref 0.0–0.7)
Eosinophils Relative: 3.2 % (ref 0.0–5.0)
HCT: 36.7 % (ref 36.0–46.0)
Hemoglobin: 12.8 g/dL (ref 12.0–15.0)
Lymphocytes Relative: 21.7 % (ref 12.0–46.0)
Lymphs Abs: 1.7 10*3/uL (ref 0.7–4.0)
MCHC: 34.8 g/dL (ref 30.0–36.0)
MCV: 92.2 fl (ref 78.0–100.0)
Monocytes Absolute: 0.5 10*3/uL (ref 0.1–1.0)
Monocytes Relative: 6.5 % (ref 3.0–12.0)
Neutro Abs: 5.3 10*3/uL (ref 1.4–7.7)
Neutrophils Relative %: 67.7 % (ref 43.0–77.0)
Platelets: 258 10*3/uL (ref 150.0–400.0)
RBC: 3.98 Mil/uL (ref 3.87–5.11)
RDW: 13 % (ref 11.5–15.5)
WBC: 7.9 10*3/uL (ref 4.0–10.5)

## 2016-09-08 LAB — FERRITIN: Ferritin: 250.9 ng/mL (ref 10.0–291.0)

## 2016-09-08 MED ORDER — HYDROCODONE-ACETAMINOPHEN 5-325 MG PO TABS: 1.0000 | ORAL_TABLET | Freq: Three times a day (TID) | ORAL | 0 refills | Status: DC | PRN

## 2016-09-08 MED ORDER — ALPRAZOLAM 1 MG PO TABS: 1.0000 mg | ORAL_TABLET | Freq: Three times a day (TID) | ORAL | 0 refills | Status: DC | PRN

## 2016-09-08 NOTE — Progress Notes (Signed)
Subjective:    Patient ID: Lindsey Ward, female    DOB: 06/17/1950, 66 y.o.   MRN: [id number]  HPI Here for follow up of chronic pain issues  Has had some trouble with her tongue Some pain underneath---seems swollen Has to push it up due to feeling wrong  Had 2 iron infusions GI work up-- ?small intestine ulcers Due for another infusion soon, if the iron stores are still low The bills have been tough--will check here first Fatigue and leg pains are better No oral supplement at this point  Chronic constipation Wondered about trying linzess--discussed that it is expensive Has miralax to use  Back pain persists Goes down to buttocks and both legs Also in neck Tries to limit the hydrocodone  Current Outpatient Prescriptions on File Prior to Visit  Medication Sig Dispense Refill  . ALPRAZolam (XANAX) 1 MG tablet TAKE 1 TABLET BY MOUTH 3 TIMES A DAY AS NEEDED 120 tablet 0  . cetirizine (ZYRTEC) 10 MG tablet TAKE 1 TABLET BY MOUTH EVERY DAY 30 tablet 11  . cholecalciferol (VITAMIN D) 1000 UNITS tablet Take 1,000 Units by mouth daily. Reported on 09/21/2015    . gabapentin (NEURONTIN) 600 MG tablet TAKE 4 TABLETS BY MOUTH AT BEDTIME 360 tablet 3  . HYDROcodone-acetaminophen (NORCO/VICODIN) 5-325 MG tablet Take 1 tablet by mouth 3 (three) times daily as needed. 90 tablet 0  . linaclotide (LINZESS) 145 MCG CAPS capsule Take 1 capsule (145 mcg total) by mouth daily before breakfast. 61 capsule 0  . omeprazole (PRILOSEC) 20 MG capsule Take 1 capsule (20 mg total) by mouth 2 (two) times daily. 180 capsule 3  . polyethylene glycol powder (GLYCOLAX/MIRALAX) powder Take 255 g by mouth daily. 255 g 3  . SUMAtriptan (IMITREX) 50 MG tablet Take 0.5-1 tablets (25-50 mg total) by mouth daily as needed for migraine. 10 tablet 5  . triamcinolone cream (KENALOG) 0.1 % APPLY TO INFLAMED LESIONS TWICE DAILY FOR 1 WEEK  1   No current facility-administered medications on file prior to visit.      Allergies  Allergen Reactions  . Tizanidine Other (See Comments)    "throws me in a different world"  . Aspirin     REACTION: GI bleed  . Buspirone Hcl     REACTION: unspecified  . Ciprofloxacin     REACTION: unspecified  . Codeine   . Diazepam   . Duloxetine     REACTION: sz like activity  . Ibuprofen     REACTION: GI bleed  . Oxycodone-Acetaminophen     REACTION: unspecified  . Pregabalin     REACTION: kept her up and confusion  . Propoxyphene Hcl   . Risperidone     REACTION: confusion  . Sulfonamide Derivatives     REACTION: unspecified  . Tramadol Hcl     REACTION: doesn't remember what happened but couldn't tolerate  . Venlafaxine     REACTION: unspecified  . Methocarbamol Rash    Past Medical History:  Diagnosis Date  . Bowel obstruction ([place])   . Chronic fatigue   . Depression   . Fibromyalgia   . GERD (gastroesophageal reflux disease)   . Hyperlipemia   . Migraine   . Obstipation   . Osteopenia   . PUD (peptic ulcer disease)     Past Surgical History:  Procedure Laterality Date  . ABDOMINAL SURGERY    . APPENDECTOMY  2005  . BREAST BIOPSY Bilateral 1999   rt w/out clip - neg,  lt w/clip - neg  . BREAST CYST ASPIRATION Right 2003   neg  . CHOLECYSTECTOMY  2004  . COLONOSCOPY WITH PROPOFOL N/A 06/09/2016   Procedure: COLONOSCOPY WITH PROPOFOL;  Surgeon: Jonathon Bellows, MD;  Location: [facility] ENDOSCOPY;  Service: Endoscopy;  Laterality: N/A;  . ESOPHAGOGASTRODUODENOSCOPY (EGD) WITH PROPOFOL N/A 06/09/2016   Procedure: ESOPHAGOGASTRODUODENOSCOPY (EGD) WITH PROPOFOL;  Surgeon: Jonathon Bellows, MD;  Location: [facility] ENDOSCOPY;  Service: Endoscopy;  Laterality: N/A;  . GIVENS CAPSULE STUDY N/A 06/23/2016   Procedure: GIVENS CAPSULE STUDY;  Surgeon: Jonathon Bellows, MD;  Location: [facility] ENDOSCOPY;  Service: Endoscopy;  Laterality: N/A;  . TOTAL ABDOMINAL HYSTERECTOMY W/ BILATERAL SALPINGOOPHORECTOMY  1986    Family History  Problem Relation Age of Onset  . Heart  disease Mother        AMI  . Stroke Mother   . Heart disease Father   . Hypertension Sister   . Breast cancer Sister   . Hypertension Sister   . Heart disease Sister        MI  . Diabetes Sister   . Kidney cancer Neg Hx   . Kidney disease Neg Hx   . Prostate cancer Neg Hx     Social History   Social History  . Marital status: Single    Spouse name: N/A  . Number of children: 1  . Years of education: N/A   Occupational History  . DISABLED    Social History Main Topics  . Smoking status: Former Smoker    Packs/day: 1.00    Years: 40.00    Types: Cigarettes    Quit date: 09/25/2013  . Smokeless tobacco: Never Used     Comment:    . Alcohol use 0.0 oz/week     Comment: rarely  . Drug use: No  . Sexual activity: Not on file   Other Topics Concern  . Not on file   Social History Narrative   Living alone again      No living will   Daughter should make health care decisions   Requests DNR---done 10/24/14   No tube feeds if cognitively unaware   Highest level of education: 9th      Review of Systems Eating okay Weight stable Still not sleeping well--uses 2 gabapentin and 2 alprazolam    Objective:   Physical Exam        Assessment & Plan:

## 2016-09-08 NOTE — Assessment & Plan Note (Signed)
Doesn't want the expense of more iron infusions Will recheck labs today

## 2016-09-08 NOTE — Assessment & Plan Note (Signed)
Uses the hydrocodone regularly but doesn't fill except every 2-3 months Checked CSRS-- nothing other than from here

## 2016-09-08 NOTE — Assessment & Plan Note (Signed)
Inflammation around 3-21mm ulcer at base of frenulum Tongue completely normal Discussed supportive care

## 2016-09-22 ENCOUNTER — Telehealth: Payer: Self-pay

## 2016-09-22 NOTE — Telephone Encounter (Signed)
She is not anemic She doesn't need extra iron She could take a general multivitamin daily if she wanted to

## 2016-09-22 NOTE — Telephone Encounter (Signed)
Pt left vm would like to know if you would recommend OTC iron?

## 2016-09-23 NOTE — Telephone Encounter (Signed)
Spoke to pt. She will try a multivitamin.

## 2016-11-02 ENCOUNTER — Other Ambulatory Visit: Payer: Self-pay | Admitting: Internal Medicine

## 2016-11-02 NOTE — Telephone Encounter (Signed)
Last filled 09-08-16 #120 Last OV 09-08-16 Next OV 03-20-17

## 2016-11-02 NOTE — Telephone Encounter (Signed)
Approved: #[id number] x 0 

## 2016-11-02 NOTE — Telephone Encounter (Signed)
Called rx into pt's pharmacy. Nothing additional is needed

## 2016-11-09 ENCOUNTER — Ambulatory Visit (INDEPENDENT_AMBULATORY_CARE_PROVIDER_SITE_OTHER)
Admission: RE | Admit: 2016-11-09 | Discharge: 2016-11-09 | Disposition: A | Discharge status: Home / Self Care | Payer: Medicare Other | Source: Ambulatory Visit | Attending: Family Medicine | Admitting: Family Medicine

## 2016-11-09 ENCOUNTER — Encounter: Payer: Self-pay | Admitting: Family Medicine

## 2016-11-09 ENCOUNTER — Ambulatory Visit (INDEPENDENT_AMBULATORY_CARE_PROVIDER_SITE_OTHER): Payer: Medicare Other | Admitting: Family Medicine

## 2016-11-09 VITALS — BP 104/60 | HR 64 | Temp 98.6°F | Ht 62.5 in | Wt 134.0 lb

## 2016-11-09 DIAGNOSIS — M25561 Pain in right knee: Secondary | ICD-10-CM | POA: Diagnosis not present

## 2016-11-09 DIAGNOSIS — M25562 Pain in left knee: Secondary | ICD-10-CM

## 2016-11-09 DIAGNOSIS — S8992XA Unspecified injury of left lower leg, initial encounter: Secondary | ICD-10-CM | POA: Diagnosis not present

## 2016-11-09 NOTE — Progress Notes (Signed)
Dr. Frederico Hamman T. Ethelda Deangelo, MD, [facility] [place], [address] Phone: [phone] Fax: [phone]  11/09/2016  Patient: Lindsey Ward, MRN: [id number], DOB: [birth year], 66 y.o.  Primary Physician:  Venia Carbon, MD   Chief Complaint  Patient presents with  . Fall    last Thursday going down the stairs  . Knee Pain    Bilateral   Subjective:   Lindsey Ward is a 66 y.o. very pleasant female patient who presents with the following:  [place] going down stairs:  Fell down back stairs and hit her head, and fell into an ice chest. She directly struck both of her knees and has some anterior scrapes on her knees. She has some pain primarily in the anterior aspect of her knees, and also on the lateral aspect of her left knee. There is no significant bruising. She is able to ambulate.  L > R knees.   Past Medical History, Surgical History, Social History, Family History, Problem List, Medications, and Allergies have been reviewed and updated if relevant.  Patient Active Problem List   Diagnosis Date Noted  . Aphthous ulcer 09/08/2016  . Meralgia paresthetica, left 07/07/2016  . Iron deficiency anemia 06/05/2016  . Constipation 05/17/2016  . Neuropathy 05/06/2016  . LPRD (laryngopharyngeal reflux disease) 09/21/2015  . Chronic low back pain with right-sided sciatica 06/17/2015  . Advance directive discussed with patient 10/24/2014  . Counseling regarding advanced directives 10/21/2013  . Low back pain 02/11/2013  . Routine general medical examination at a health care facility 07/14/2010  . OSTEOPENIA 03/10/2009  . Episodic mood disorder ([place]) 08/15/2006  . Depression 08/15/2006  . Gastroesophageal reflux disease 08/15/2006  . Fibromyalgia 08/15/2006  . Sleep disturbance 08/15/2006  . Hyperlipidemia 12/20/2005  . PANIC ATTACKS 12/20/2005  . Migraine without status migrainosus, not intractable 12/20/2005    Past  Medical History:  Diagnosis Date  . Bowel obstruction ([place])   . Chronic fatigue   . Depression   . Fibromyalgia   . GERD (gastroesophageal reflux disease)   . Hyperlipemia   . Migraine   . Obstipation   . Osteopenia   . PUD (peptic ulcer disease)     Past Surgical History:  Procedure Laterality Date  . ABDOMINAL SURGERY    . APPENDECTOMY  2005  . BREAST BIOPSY Bilateral 1999   rt w/out clip - neg, lt w/clip - neg  . BREAST CYST ASPIRATION Right 2003   neg  . CHOLECYSTECTOMY  2004  . COLONOSCOPY WITH PROPOFOL N/A 06/09/2016   Procedure: COLONOSCOPY WITH PROPOFOL;  Surgeon: Jonathon Bellows, MD;  Location: [facility] ENDOSCOPY;  Service: Endoscopy;  Laterality: N/A;  . ESOPHAGOGASTRODUODENOSCOPY (EGD) WITH PROPOFOL N/A 06/09/2016   Procedure: ESOPHAGOGASTRODUODENOSCOPY (EGD) WITH PROPOFOL;  Surgeon: Jonathon Bellows, MD;  Location: [facility] ENDOSCOPY;  Service: Endoscopy;  Laterality: N/A;  . GIVENS CAPSULE STUDY N/A 06/23/2016   Procedure: GIVENS CAPSULE STUDY;  Surgeon: Jonathon Bellows, MD;  Location: [facility] ENDOSCOPY;  Service: Endoscopy;  Laterality: N/A;  . TOTAL ABDOMINAL HYSTERECTOMY W/ BILATERAL SALPINGOOPHORECTOMY  1986    Social History   Social History  . Marital status: Single    Spouse name: N/A  . Number of children: 1  . Years of education: N/A   Occupational History  . DISABLED    Social History Main Topics  . Smoking status: Former Smoker    Packs/day: 1.00    Years: 40.00    Types: Cigarettes  Quit date: 09/25/2013  . Smokeless tobacco: Never Used     Comment:    . Alcohol use 0.0 oz/week     Comment: rarely  . Drug use: No  . Sexual activity: Not on file   Other Topics Concern  . Not on file   Social History Narrative   Living alone again      No living will   Daughter should make health care decisions   Requests DNR---done 10/24/14   No tube feeds if cognitively unaware   Highest level of education: 9th       Family History  Problem Relation Age of Onset  .  Heart disease Mother        AMI  . Stroke Mother   . Heart disease Father   . Hypertension Sister   . Breast cancer Sister   . Hypertension Sister   . Heart disease Sister        MI  . Diabetes Sister   . Kidney cancer Neg Hx   . Kidney disease Neg Hx   . Prostate cancer Neg Hx     Allergies  Allergen Reactions  . Tizanidine Other (See Comments)    "throws me in a different world"  . Aspirin     REACTION: GI bleed  . Buspirone Hcl     REACTION: unspecified  . Ciprofloxacin     REACTION: unspecified  . Codeine   . Diazepam   . Duloxetine     REACTION: sz like activity  . Ibuprofen     REACTION: GI bleed  . Oxycodone-Acetaminophen     REACTION: unspecified  . Pregabalin     REACTION: kept her up and confusion  . Propoxyphene Hcl   . Risperidone     REACTION: confusion  . Sulfonamide Derivatives     REACTION: unspecified  . Tramadol Hcl     REACTION: doesn't remember what happened but couldn't tolerate  . Venlafaxine     REACTION: unspecified  . Methocarbamol Rash    Medication list reviewed and updated in full in [place].  GEN: No fevers, chills. Nontoxic. Primarily MSK c/o today. MSK: Detailed in the HPI GI: tolerating PO intake without difficulty Neuro: No numbness, parasthesias, or tingling associated. Otherwise the pertinent positives of the ROS are noted above.   Objective:   BP 104/60   Pulse 64   Temp 98.6 F (37 C) (Oral)   Ht 5' 2.5" (1.588 m)   Wt 134 lb (60.8 kg)   BMI 24.12 kg/m    GEN: WDWN, NAD, Non-toxic, Alert & Oriented x 3 HEENT: Atraumatic, Normocephalic.  Ears and Nose: No external deformity. EXTR: No clubbing/cyanosis/edema NEURO: Normal gait.  PSYCH: Normally interactive. Conversant. Not depressed or anxious appearing.  Calm demeanor.   Knee:  b Gait: Normal heel toe pattern ROM: 0-125 Effusion: neg Echymosis or edema: none Patellar tendon NT Painful PLICA: neg Patellar grind: negative Medial and lateral  patellar facet loading: negative medial and lateral joint lines:NT Mcmurray's neg Flexion-pinch neg Varus and valgus stress: stable Lachman: neg [place] and [place] drawer: neg Hip abduction, IR, ER: WNL Hip flexion str: 5/5 Hip abd: 5/5 Quad: 5/5 VMO atrophy:No Hamstring concentric and eccentric: 5/5   Radiology: Dg Knee 4 Views W/patella Left  Result Date: 11/10/2016 CLINICAL DATA:  Fall 6 days ago, bilateral knee pain EXAM: LEFT KNEE - COMPLETE 4+ VIEW COMPARISON:  None. FINDINGS: No evidence of fracture, dislocation, or joint effusion. No evidence of arthropathy or  other focal bone abnormality. Soft tissues are unremarkable. IMPRESSION: Negative. Electronically Signed   By: Rolm Baptise M.D.   On: 11/10/2016 08:03     Assessment and Plan:   Acute pain of both knees - Plan: DG Knee 4 Views W/Patella Left  Reassurance, suspected this is soft tissue bruising and bone bruising and will resolve and summer between 2 and 4 weeks.  Follow-up: No Follow-up on file.  Future Appointments Date Time Provider [place]  03/20/2017 3:00 PM Venia Carbon, MD [facility]    Meds ordered this encounter  Medications  . gabapentin (NEURONTIN) 600 MG tablet    Sig: Take 600 mg by mouth at bedtime.   Medications Discontinued During This Encounter  Medication Reason  . polyethylene glycol powder (GLYCOLAX/MIRALAX) powder Completed Course  . linaclotide (LINZESS) 145 MCG CAPS capsule Completed Course  . cholecalciferol (VITAMIN D) 1000 UNITS tablet Completed Course  . gabapentin (NEURONTIN) 600 MG tablet Change in therapy   Orders Placed This Encounter  Procedures  . DG Knee 4 Views W/Patella Left    Signed,  Faiz Weber T. Nusaiba Guallpa, MD   Patient's Medications  New Prescriptions   No medications on file  Previous Medications   ALPRAZOLAM (XANAX) 1 MG TABLET    TAKE 1 TABLET BY MOUTH THREE TIMES DAILY AS NEEDED   CETIRIZINE (ZYRTEC) 10 MG TABLET    TAKE 1 TABLET BY  MOUTH EVERY DAY   GABAPENTIN (NEURONTIN) 600 MG TABLET    Take 600 mg by mouth at bedtime.   HYDROCODONE-ACETAMINOPHEN (NORCO/VICODIN) 5-325 MG TABLET    Take 1 tablet by mouth 3 (three) times daily as needed.   OMEPRAZOLE (PRILOSEC) 20 MG CAPSULE    Take 1 capsule (20 mg total) by mouth 2 (two) times daily.   SUMATRIPTAN (IMITREX) 50 MG TABLET    Take 0.5-1 tablets (25-50 mg total) by mouth daily as needed for migraine.   TRIAMCINOLONE CREAM (KENALOG) 0.1 %    APPLY TO INFLAMED LESIONS TWICE DAILY FOR 1 WEEK  Modified Medications   No medications on file  Discontinued Medications   CHOLECALCIFEROL (VITAMIN D) 1000 UNITS TABLET    Take 1,000 Units by mouth daily. Reported on 09/21/2015   GABAPENTIN (NEURONTIN) 600 MG TABLET    TAKE 4 TABLETS BY MOUTH AT BEDTIME   LINACLOTIDE (LINZESS) 145 MCG CAPS CAPSULE    Take 1 capsule (145 mcg total) by mouth daily before breakfast.   POLYETHYLENE GLYCOL POWDER (GLYCOLAX/MIRALAX) POWDER    Take 255 g by mouth daily.

## 2016-11-18 ENCOUNTER — Telehealth: Payer: Self-pay

## 2016-11-18 NOTE — Telephone Encounter (Signed)
PLEASE NOTE: All timestamps contained within this report are represented as [place] Standard Time. CONFIDENTIALTY NOTICE: This fax transmission is intended only for the addressee. It contains information that is legally privileged, confidential or otherwise protected from use or disclosure. If you are not the intended recipient, you are strictly prohibited from reviewing, disclosing, copying using or disseminating any of this information or taking any action in reliance on or regarding this information. If you have received this fax in error, please notify us immediately by telephone so that we can arrange for its return to [place]. Phone: [phone], Toll-Free: [phone], Fax: [phone] Page: 1 of 1 Call Id: [id number] [place] Day - Client Nonclinical Telephone Record [place] Day - Client Client Site [place] - Day Contact Type Call Who Is Calling Patient / Member / Family / Caregiver Caller Name Lindsey Ward Caller Phone Number [phone] Patient Name Lindsey Ward Call Type Message Only Information Provided Reason for Call Request for General Office Information Initial Comment Caller states she was told to go to the walk in clinic. She isn't going to go to the clinic. Additional Comment Call Closed By: Claudette Laws Transaction Date/Time: 11/18/2016 1:23:59 PM (ET)

## 2016-11-18 NOTE — Telephone Encounter (Signed)
I spoke with Lindsey Ward and she said last night she felt tired and had some SOB. At the first of 2018 Lindsey Ward had to have iron infusions; Lindsey Ward started taking oral iron last night and today and Lindsey Ward said she feels a lot better and will cb if symptoms return FYI to Dr Silvio Pate.

## 2016-11-18 NOTE — Telephone Encounter (Signed)
Patient Name: Lindsey Ward DOB: 1950-04-25 Initial Comment Caller states, has very low energy, has to take short breaths. She was infusions at [facility] cancer center Nurse Assessment Nurse: Emilio Math, RN, Est[facility] Bamberg Date/Time [name] Time): 11/18/2016 12:45:10 PM Confirm and document reason for call. If symptomatic, describe symptoms. ---Caller states she had an Iron infusion in March or April. Now with low energy and shortness of breath. Does the patient have any new or worsening symptoms? ---Yes Will a triage be completed? ---Yes Related visit to physician within the last 2 weeks? ---Yes Does the PT have any chronic conditions? (i.e. diabetes, asthma, etc.) ---Yes List chronic conditions. ---Fibromyalgia, anxiety/panic attacks Is this a behavioral health or substance abuse call? ---No Guidelines Guideline Title Affirmed Question Affirmed Notes Breathing Difficulty [1] MILD difficulty breathing (e.g., minimal/no SOB at rest, SOB with walking, pulse <100) AND [2] NEW-onset or WORSE than normal Final Disposition User See Physician within 4 Hours (or PCP triage) Emilio Math, RN, [place] Bamberg Comments Requiring "slow, deep" breaths. Referrals REFERRED TO PCP OFFICE GO TO FACILITY OTHER - SPECIFY Disagree/Comply: Comply

## 2016-11-19 NOTE — Telephone Encounter (Signed)
Please check on her on Monday 

## 2016-11-21 ENCOUNTER — Encounter: Payer: Self-pay | Admitting: Family Medicine

## 2016-11-21 ENCOUNTER — Ambulatory Visit (INDEPENDENT_AMBULATORY_CARE_PROVIDER_SITE_OTHER): Payer: Medicare Other | Admitting: Family Medicine

## 2016-11-21 VITALS — BP 108/62 | HR 71 | Temp 97.7°F | Resp 16 | Wt 129.1 lb

## 2016-11-21 DIAGNOSIS — D508 Other iron deficiency anemias: Secondary | ICD-10-CM | POA: Diagnosis not present

## 2016-11-21 DIAGNOSIS — R1032 Left lower quadrant pain: Secondary | ICD-10-CM | POA: Insufficient documentation

## 2016-11-21 DIAGNOSIS — R110 Nausea: Secondary | ICD-10-CM

## 2016-11-21 LAB — CBC WITH DIFFERENTIAL/PLATELET
Basophils Absolute: 0.1 10*3/uL (ref 0.0–0.1)
Basophils Relative: 0.7 % (ref 0.0–3.0)
Eosinophils Absolute: 0.1 10*3/uL (ref 0.0–0.7)
Eosinophils Relative: 1.1 % (ref 0.0–5.0)
HCT: 41 % (ref 36.0–46.0)
Hemoglobin: 13.8 g/dL (ref 12.0–15.0)
Lymphocytes Relative: 15.8 % (ref 12.0–46.0)
Lymphs Abs: 1.2 10*3/uL (ref 0.7–4.0)
MCHC: 33.7 g/dL (ref 30.0–36.0)
MCV: 96.6 fl (ref 78.0–100.0)
Monocytes Absolute: 0.4 10*3/uL (ref 0.1–1.0)
Monocytes Relative: 5.7 % (ref 3.0–12.0)
Neutro Abs: 5.9 10*3/uL (ref 1.4–7.7)
Neutrophils Relative %: 76.7 % (ref 43.0–77.0)
Platelets: 318 10*3/uL (ref 150.0–400.0)
RBC: 4.25 Mil/uL (ref 3.87–5.11)
RDW: 12.2 % (ref 11.5–15.5)
WBC: 7.7 10*3/uL (ref 4.0–10.5)

## 2016-11-21 LAB — H. PYLORI ANTIBODY, IGG: H Pylori IgG: POSITIVE — AB

## 2016-11-21 MED ORDER — PROMETHAZINE HCL 12.5 MG PO TABS: 12.5000 mg | ORAL_TABLET | Freq: Three times a day (TID) | ORAL | 0 refills | Status: DC | PRN

## 2016-11-21 NOTE — Assessment & Plan Note (Signed)
Wide differential and discussed this with pt. Exam is non tender and reassuring. Could have a mild form of gastroenteritis- multiple drug allergies but can tolerate phenergan - eRx sent. She is very concerned about her anemia- will check CBC and h pylori today as well.  If symptoms progress, ?diverticulitis although no bowel changes- she does have some LLQ pain.  The patient indicates understanding of these issues and agrees with the plan.

## 2016-11-21 NOTE — Patient Instructions (Addendum)
Good to see you.  I will call you with your lab results.  Phenergan as needed for nausea.

## 2016-11-21 NOTE — Progress Notes (Signed)
Subjective:   Patient ID: Lindsey Ward, female    DOB: Mar 12, 1951, 66 y.o.   MRN: [id number]  Lindsey Ward is a pleasant 66 y.o. year old female patient of Dr. Silvio Pate, new to me, who presents to clinic today with Abdominal Pain (nausea, fatigue, low iron )  on 11/21/2016  HPI:  Nausea- since last week. Daily, now with some left lower quadrant pain.  Feels very tired.  No vomiting.  No diarrhea.    She did recently restart her iron pills.  Has been followed by GI, Dr. Vicente Males.  Colonoscopy 06/09/16- reviewed- diverticulosis.  Had endoscopy in 06/2016- reviewed- small intestinal ulcer noted.  Has been receiving iron infusions with hematology.   Lab Results  Component Value Date   WBC 7.9 09/08/2016   HGB 12.8 09/08/2016   HCT 36.7 09/08/2016   MCV 92.2 09/08/2016   PLT 258.0 09/08/2016     Current Outpatient Prescriptions on File Prior to Visit  Medication Sig Dispense Refill  . ALPRAZolam (XANAX) 1 MG tablet TAKE 1 TABLET BY MOUTH THREE TIMES DAILY AS NEEDED 120 tablet 0  . cetirizine (ZYRTEC) 10 MG tablet TAKE 1 TABLET BY MOUTH EVERY DAY 30 tablet 11  . HYDROcodone-acetaminophen (NORCO/VICODIN) 5-325 MG tablet Take 1 tablet by mouth 3 (three) times daily as needed. 90 tablet 0  . omeprazole (PRILOSEC) 20 MG capsule Take 1 capsule (20 mg total) by mouth 2 (two) times daily. 180 capsule 3  . SUMAtriptan (IMITREX) 50 MG tablet Take 0.5-1 tablets (25-50 mg total) by mouth daily as needed for migraine. 10 tablet 5  . triamcinolone cream (KENALOG) 0.1 % APPLY TO INFLAMED LESIONS TWICE DAILY FOR 1 WEEK  1   No current facility-administered medications on file prior to visit.     Allergies  Allergen Reactions  . Tizanidine Other (See Comments)    "throws me in a different world"  . Aspirin     REACTION: GI bleed  . Buspirone Hcl     REACTION: unspecified  . Ciprofloxacin     REACTION: unspecified  . Codeine   . Diazepam   . Duloxetine     REACTION: sz like activity    . Ibuprofen     REACTION: GI bleed  . Oxycodone-Acetaminophen     REACTION: unspecified  . Pregabalin     REACTION: kept her up and confusion  . Propoxyphene Hcl   . Risperidone     REACTION: confusion  . Sulfonamide Derivatives     REACTION: unspecified  . Tramadol Hcl     REACTION: doesn't remember what happened but couldn't tolerate  . Venlafaxine     REACTION: unspecified  . Methocarbamol Rash    Past Medical History:  Diagnosis Date  . Bowel obstruction ([place])   . Chronic fatigue   . Depression   . Fibromyalgia   . GERD (gastroesophageal reflux disease)   . Hyperlipemia   . Migraine   . Obstipation   . Osteopenia   . PUD (peptic ulcer disease)     Past Surgical History:  Procedure Laterality Date  . ABDOMINAL SURGERY    . APPENDECTOMY  2005  . BREAST BIOPSY Bilateral 1999   rt w/out clip - neg, lt w/clip - neg  . BREAST CYST ASPIRATION Right 2003   neg  . CHOLECYSTECTOMY  2004  . COLONOSCOPY WITH PROPOFOL N/A 06/09/2016   Procedure: COLONOSCOPY WITH PROPOFOL;  Surgeon: Jonathon Bellows, MD;  Location: [facility] ENDOSCOPY;  Service: Endoscopy;  Laterality:  N/A;  . ESOPHAGOGASTRODUODENOSCOPY (EGD) WITH PROPOFOL N/A 06/09/2016   Procedure: ESOPHAGOGASTRODUODENOSCOPY (EGD) WITH PROPOFOL;  Surgeon: Jonathon Bellows, MD;  Location: [facility] ENDOSCOPY;  Service: Endoscopy;  Laterality: N/A;  . GIVENS CAPSULE STUDY N/A 06/23/2016   Procedure: GIVENS CAPSULE STUDY;  Surgeon: Jonathon Bellows, MD;  Location: [facility] ENDOSCOPY;  Service: Endoscopy;  Laterality: N/A;  . TOTAL ABDOMINAL HYSTERECTOMY W/ BILATERAL SALPINGOOPHORECTOMY  1986    Family History  Problem Relation Age of Onset  . Heart disease Mother        AMI  . Stroke Mother   . Heart disease Father   . Hypertension Sister   . Breast cancer Sister   . Hypertension Sister   . Heart disease Sister        MI  . Diabetes Sister   . Kidney cancer Neg Hx   . Kidney disease Neg Hx   . Prostate cancer Neg Hx     Social History    Social History  . Marital status: Single    Spouse name: N/A  . Number of children: 1  . Years of education: N/A   Occupational History  . DISABLED    Social History Main Topics  . Smoking status: Former Smoker    Packs/day: 1.00    Years: 40.00    Types: Cigarettes    Quit date: 09/25/2013  . Smokeless tobacco: Never Used     Comment:    . Alcohol use 0.0 oz/week     Comment: rarely  . Drug use: No  . Sexual activity: Not on file   Other Topics Concern  . Not on file   Social History Narrative   Living alone again      No living will   Daughter should make health care decisions   Requests DNR---done 10/24/14   No tube feeds if cognitively unaware   Highest level of education: 9th      The PMH, PSH, Social History, Family History, Medications, and allergies have been reviewed in [facility], and have been updated if relevant.   Review of Systems  Constitutional: Positive for fatigue.  Gastrointestinal: Positive for abdominal pain and nausea. Negative for abdominal distention, anal bleeding, blood in stool, constipation, diarrhea, rectal pain and vomiting.  Genitourinary: Negative.   All other systems reviewed and are negative.      Objective:    BP 108/62   Pulse 71   Temp 97.7 F (36.5 C) (Oral)   Resp 16   Wt 129 lb 1.9 oz (58.6 kg)   SpO2 97%   BMI 23.24 kg/m    Physical Exam  Constitutional: She is oriented to person, place, and time. She appears well-developed and well-nourished. No distress.  HENT:  Head: Normocephalic and atraumatic.  Eyes: Conjunctivae are normal.  Cardiovascular: Normal rate.   Pulmonary/Chest: Effort normal.  Abdominal: Soft. Bowel sounds are normal. She exhibits no distension. There is no tenderness. There is no rebound and no guarding.  Musculoskeletal: Normal range of motion. She exhibits no edema.  Neurological: She is alert and oriented to person, place, and time. No cranial nerve deficit.  Skin: Skin is warm and dry. She  is not diaphoretic.  Psychiatric: She has a normal mood and affect. Her behavior is normal. Judgment and thought content normal.  Nursing note and vitals reviewed.         Assessment & Plan:   Other iron deficiency anemia  Nausea No Follow-up on file.

## 2016-11-21 NOTE — Telephone Encounter (Signed)
Will wait to see what happens at that visit

## 2016-11-21 NOTE — Telephone Encounter (Signed)
Spoke to pt who states she is still having nausea and fatigue. She is also having trouble and thinks her iron may be low. She did restart them but was unable to continue due to it making her sick. She is scheduled to see Dr Deborra Medina today at 1045

## 2016-11-22 ENCOUNTER — Other Ambulatory Visit: Payer: Self-pay | Admitting: Family Medicine

## 2016-11-22 MED ORDER — AMOXICILLIN 500 MG PO CAPS: 1000.0000 mg | ORAL_CAPSULE | Freq: Two times a day (BID) | ORAL | 0 refills | Status: DC

## 2016-11-22 MED ORDER — CLARITHROMYCIN 500 MG PO TABS: 500.0000 mg | ORAL_TABLET | Freq: Two times a day (BID) | ORAL | 0 refills | Status: DC

## 2016-11-25 ENCOUNTER — Emergency Department
Admission: EM | Admit: 2016-11-25 | Discharge: 2016-11-25 | Disposition: A | Discharge status: Home / Self Care | Payer: Medicare Other | Attending: Emergency Medicine | Admitting: Emergency Medicine

## 2016-11-25 ENCOUNTER — Encounter: Payer: Self-pay | Admitting: Emergency Medicine

## 2016-11-25 DIAGNOSIS — B9681 Helicobacter pylori [H. pylori] as the cause of diseases classified elsewhere: Secondary | ICD-10-CM

## 2016-11-25 DIAGNOSIS — Z87891 Personal history of nicotine dependence: Secondary | ICD-10-CM | POA: Diagnosis not present

## 2016-11-25 DIAGNOSIS — R1010 Upper abdominal pain, unspecified: Secondary | ICD-10-CM | POA: Diagnosis present

## 2016-11-25 DIAGNOSIS — K259 Gastric ulcer, unspecified as acute or chronic, without hemorrhage or perforation: Secondary | ICD-10-CM | POA: Diagnosis not present

## 2016-11-25 DIAGNOSIS — K279 Peptic ulcer, site unspecified, unspecified as acute or chronic, without hemorrhage or perforation: Secondary | ICD-10-CM | POA: Insufficient documentation

## 2016-11-25 DIAGNOSIS — Z79899 Other long term (current) drug therapy: Secondary | ICD-10-CM | POA: Insufficient documentation

## 2016-11-25 LAB — COMPREHENSIVE METABOLIC PANEL
ALT: 23 U/L (ref 14–54)
AST: 33 U/L (ref 15–41)
Albumin: 4.2 g/dL (ref 3.5–5.0)
Alkaline Phosphatase: 89 U/L (ref 38–126)
Anion gap: 8 (ref 5–15)
BUN: 10 mg/dL (ref 6–20)
CO2: 27 mmol/L (ref 22–32)
Calcium: 9.3 mg/dL (ref 8.9–10.3)
Chloride: 105 mmol/L (ref 101–111)
Creatinine, Ser: 0.78 mg/dL (ref 0.44–1.00)
GFR calc Af Amer: >60 mL/min (ref >60)
GFR calc non Af Amer: >60 mL/min (ref >60)
Glucose, Bld: 106 mg/dL — ABNORMAL HIGH (ref 65–99)
Potassium: 3.2 mmol/L — ABNORMAL LOW (ref 3.5–5.1)
Sodium: 140 mmol/L (ref 135–145)
Total Bilirubin: 0.5 mg/dL (ref 0.3–1.2)
Total Protein: 7.1 g/dL (ref 6.5–8.1)

## 2016-11-25 LAB — URINALYSIS, COMPLETE (UACMP) WITH MICROSCOPIC
Bacteria, UA: NONE SEEN
Bilirubin Urine: NEGATIVE
Glucose, UA: NEGATIVE mg/dL
Hgb urine dipstick: NEGATIVE
Ketones, ur: NEGATIVE mg/dL
Leukocytes, UA: NEGATIVE
Nitrite: NEGATIVE
Protein, ur: NEGATIVE mg/dL
RBC / HPF: NONE SEEN RBC/hpf (ref 0–5)
Specific Gravity, Urine: 1.004 — ABNORMAL LOW (ref 1.005–1.030)
pH: 7 (ref 5.0–8.0)

## 2016-11-25 LAB — LIPASE, BLOOD: Lipase: 48 U/L (ref 11–51)

## 2016-11-25 LAB — CBC
HCT: 37.1 % (ref 35.0–47.0)
Hemoglobin: 13 g/dL (ref 12.0–16.0)
MCH: 32.4 pg (ref 26.0–34.0)
MCHC: 35.1 g/dL (ref 32.0–36.0)
MCV: 92.5 fL (ref 80.0–100.0)
Platelets: 298 10*3/uL (ref 150–440)
RBC: 4.02 MIL/uL (ref 3.80–5.20)
RDW: 12 % (ref 11.5–14.5)
WBC: 6.4 10*3/uL (ref 3.6–11.0)

## 2016-11-25 NOTE — Discharge Instructions (Signed)
Please take all of your medications as prescribed and follow up with your primary care physician as needed. Return to the emergency department for any concerns such as worsening pain, fevers, chills, if you cannot eat, or for any other issues whatsoever.  It was a pleasure to take care of you today, and thank you for coming to our emergency department.  If you have any questions or concerns before leaving please ask the nurse to grab me and I'm more than happy to go through your aftercare instructions again.  If you were prescribed any opioid pain medication today such as Norco, Vicodin, Percocet, morphine, hydrocodone, or oxycodone please make sure you do not drive when you are taking this medication as it can alter your ability to drive safely.  If you have any concerns once you are home that you are not improving or are in fact getting worse before you can make it to your follow-up appointment, please do not hesitate to call 911 and come back for further evaluation.  Darel Hong, MD  Results for orders placed or performed during the hospital encounter of 11/25/16  Lipase, blood  Result Value Ref Range   Lipase 48 11 - 51 U/L  Comprehensive metabolic panel  Result Value Ref Range   Sodium 140 135 - 145 mmol/L   Potassium 3.2 (L) 3.5 - 5.1 mmol/L   Chloride 105 101 - 111 mmol/L   CO2 27 22 - 32 mmol/L   Glucose, Bld 106 (H) 65 - 99 mg/dL   BUN 10 6 - 20 mg/dL   Creatinine, Ser 0.78 0.44 - 1.00 mg/dL   Calcium 9.3 8.9 - 10.3 mg/dL   Total Protein 7.1 6.5 - 8.1 g/dL   Albumin 4.2 3.5 - 5.0 g/dL   AST 33 15 - 41 U/L   ALT 23 14 - 54 U/L   Alkaline Phosphatase 89 38 - 126 U/L   Total Bilirubin 0.5 0.3 - 1.2 mg/dL   GFR calc non Af Amer >60 >60 mL/min   GFR calc Af Amer >60 >60 mL/min   Anion gap 8 5 - 15  CBC  Result Value Ref Range   WBC 6.4 3.6 - 11.0 K/uL   RBC 4.02 3.80 - 5.20 MIL/uL   Hemoglobin 13.0 12.0 - 16.0 g/dL   HCT 37.1 35.0 - 47.0 %   MCV 92.5 80.0 - 100.0 fL   MCH 32.4 26.0 - 34.0 pg   MCHC 35.1 32.0 - 36.0 g/dL   RDW 12.0 11.5 - 14.5 %   Platelets 298 150 - 440 K/uL  Urinalysis, Complete w Microscopic  Result Value Ref Range   Color, Urine STRAW (A) YELLOW   APPearance CLEAR (A) CLEAR   Specific Gravity, Urine 1.004 (L) 1.005 - 1.030   pH 7.0 5.0 - 8.0   Glucose, UA NEGATIVE NEGATIVE mg/dL   Hgb urine dipstick NEGATIVE NEGATIVE   Bilirubin Urine NEGATIVE NEGATIVE   Ketones, ur NEGATIVE NEGATIVE mg/dL   Protein, ur NEGATIVE NEGATIVE mg/dL   Nitrite NEGATIVE NEGATIVE   Leukocytes, UA NEGATIVE NEGATIVE   RBC / HPF NONE SEEN 0 - 5 RBC/hpf   WBC, UA 0-5 0 - 5 WBC/hpf   Bacteria, UA NONE SEEN NONE SEEN   Squamous Epithelial / LPF 0-5 (A) NONE SEEN   Dg Knee 4 Views W/patella Left  Result Date: 11/10/2016 CLINICAL DATA:  Fall 6 days ago, bilateral knee pain EXAM: LEFT KNEE - COMPLETE 4+ VIEW COMPARISON:  None. FINDINGS: No evidence  of fracture, dislocation, or joint effusion. No evidence of arthropathy or other focal bone abnormality. Soft tissues are unremarkable. IMPRESSION: Negative. Electronically Signed   By: Rolm Baptise M.D.   On: 11/10/2016 08:03

## 2016-11-25 NOTE — ED Triage Notes (Signed)
Patient presents to ED via POV from home with c/o generalized abdominal pain. Patient denies V/D, reports nausea. Patient was recently dx with colitis and it currently on antibiotics. Patient unable to state what antibiotics she is taking. Patient states she is here because she is still not feeling better.

## 2016-11-25 NOTE — ED Provider Notes (Signed)
[facility] Emergency Department Provider Note  ____________________________________________   First MD Initiated Contact with Patient 11/25/16 1743     (approximate)  I have reviewed the triage vital signs and the nursing notes.   HISTORY  Chief Complaint Abdominal Pain    HPI Lindsey Ward is a 66 y.o. female who self presents to the emergency department with several days of malaise and fatigue. She also has mild diffuse upper abdominal aching discomfort. It seems to be worse with food and improved when not eating. She was recently diagnosed with "an infection in my intestines" and was prescribed 2 different antibiotics as well as "acid reducer". She is passing normal bowel movements and flatus. She denies chest pain or shortness of breath. She does have a recent endoscopy although she does not know the results.   Past Medical History:  Diagnosis Date  . Bowel obstruction ([place])   . Chronic fatigue   . Depression   . Fibromyalgia   . GERD (gastroesophageal reflux disease)   . Hyperlipemia   . Migraine   . Obstipation   . Osteopenia   . PUD (peptic ulcer disease)     Patient Active Problem List   Diagnosis Date Noted  . Nausea 11/21/2016  . Left lower quadrant pain 11/21/2016  . Aphthous ulcer 09/08/2016  . Meralgia paresthetica, left 07/07/2016  . Iron deficiency anemia 06/05/2016  . Constipation 05/17/2016  . Neuropathy 05/06/2016  . LPRD (laryngopharyngeal reflux disease) 09/21/2015  . Chronic low back pain with right-sided sciatica 06/17/2015  . Advance directive discussed with patient 10/24/2014  . Counseling regarding advanced directives 10/21/2013  . Low back pain 02/11/2013  . Routine general medical examination at a health care facility 07/14/2010  . OSTEOPENIA 03/10/2009  . Episodic mood disorder ([place]) 08/15/2006  . Depression 08/15/2006  . Gastroesophageal reflux disease 08/15/2006  . Fibromyalgia 08/15/2006  . Sleep  disturbance 08/15/2006  . Hyperlipidemia 12/20/2005  . PANIC ATTACKS 12/20/2005  . Migraine without status migrainosus, not intractable 12/20/2005    Past Surgical History:  Procedure Laterality Date  . ABDOMINAL SURGERY    . APPENDECTOMY  2005  . BREAST BIOPSY Bilateral 1999   rt w/out clip - neg, lt w/clip - neg  . BREAST CYST ASPIRATION Right 2003   neg  . CHOLECYSTECTOMY  2004  . COLONOSCOPY WITH PROPOFOL N/A 06/09/2016   Procedure: COLONOSCOPY WITH PROPOFOL;  Surgeon: Jonathon Bellows, MD;  Location: [facility] ENDOSCOPY;  Service: Endoscopy;  Laterality: N/A;  . ESOPHAGOGASTRODUODENOSCOPY (EGD) WITH PROPOFOL N/A 06/09/2016   Procedure: ESOPHAGOGASTRODUODENOSCOPY (EGD) WITH PROPOFOL;  Surgeon: Jonathon Bellows, MD;  Location: [facility] ENDOSCOPY;  Service: Endoscopy;  Laterality: N/A;  . GIVENS CAPSULE STUDY N/A 06/23/2016   Procedure: GIVENS CAPSULE STUDY;  Surgeon: Jonathon Bellows, MD;  Location: [facility] ENDOSCOPY;  Service: Endoscopy;  Laterality: N/A;  . TOTAL ABDOMINAL HYSTERECTOMY W/ BILATERAL SALPINGOOPHORECTOMY  1986    Prior to Admission medications   Medication Sig Start Date End Date Taking? Authorizing Provider  ALPRAZolam Duanne Moron) 1 MG tablet TAKE 1 TABLET BY MOUTH THREE TIMES DAILY AS NEEDED 11/02/16   Venia Carbon, MD  amoxicillin (AMOXIL) 500 MG capsule Take 2 capsules (1,000 mg total) by mouth 2 (two) times daily. 11/22/16   Lucille Passy, MD  cetirizine (ZYRTEC) 10 MG tablet TAKE 1 TABLET BY MOUTH EVERY DAY 05/23/16   Venia Carbon, MD  clarithromycin (BIAXIN) 500 MG tablet Take 1 tablet (500 mg total) by mouth 2 (two) times daily.  11/22/16   Lucille Passy, MD  HYDROcodone-acetaminophen (NORCO/VICODIN) 5-325 MG tablet Take 1 tablet by mouth 3 (three) times daily as needed. 09/08/16   Venia Carbon, MD  omeprazole (PRILOSEC) 20 MG capsule Take 1 capsule (20 mg total) by mouth 2 (two) times daily. 05/06/16   Venia Carbon, MD  promethazine (PHENERGAN) 12.5 MG tablet Take 1 tablet (12.5  mg total) by mouth every 8 (eight) hours as needed for nausea or vomiting. 11/21/16   Lucille Passy, MD  SUMAtriptan (IMITREX) 50 MG tablet Take 0.5-1 tablets (25-50 mg total) by mouth daily as needed for migraine. 12/24/15   Venia Carbon, MD  triamcinolone cream (KENALOG) 0.1 % APPLY TO INFLAMED LESIONS TWICE DAILY FOR 1 WEEK 06/08/16   [provider]    Allergies Tizanidine; Aspirin; Buspirone hcl; Ciprofloxacin; Codeine; Diazepam; Duloxetine; Ibuprofen; Oxycodone-acetaminophen; Pregabalin; Propoxyphene hcl; Risperidone; Sulfonamide derivatives; Tramadol hcl; Venlafaxine; and Methocarbamol  Family History  Problem Relation Age of Onset  . Heart disease Mother        AMI  . Stroke Mother   . Heart disease Father   . Hypertension Sister   . Breast cancer Sister   . Hypertension Sister   . Heart disease Sister        MI  . Diabetes Sister   . Kidney cancer Neg Hx   . Kidney disease Neg Hx   . Prostate cancer Neg Hx     Social History Social History  Substance Use Topics  . Smoking status: Former Smoker    Packs/day: 1.00    Years: 40.00    Types: Cigarettes    Quit date: 09/25/2013  . Smokeless tobacco: Never Used     Comment:    . Alcohol use 0.0 oz/week     Comment: rarely    Review of Systems Constitutional: No fever/chills Eyes: No visual changes. ENT: No sore throat. Cardiovascular: Denies chest pain. Respiratory: Denies shortness of breath. Gastrointestinal: Positive abdominal pain.  Positive nausea, no vomiting.  No diarrhea.  No constipation. Genitourinary: Negative for dysuria. Musculoskeletal: Negative for back pain. Skin: Negative for rash. Neurological: Negative for headaches, focal weakness or numbness.   ____________________________________________   PHYSICAL EXAM:  VITAL SIGNS: ED Triage Vitals [11/25/16 1639]  Enc Vitals Group     BP      Pulse      Resp      Temp      Temp src      SpO2      Weight 129 lb (58.5 kg)      Height 5\' 3"  (1.6 m)     Head Circumference      Peak Flow      Pain Score 8     Pain Loc      Pain Edu?      Excl. in [place]?     Constitutional: Alert and oriented 4 well appearing nontoxic no diaphoresis speaks in full clear sentences Eyes: PERRL EOMI. Head: Atraumatic. Nose: No congestion/rhinnorhea. Mouth/Throat: No trismus Neck: No stridor.   Cardiovascular: Normal rate, regular rhythm. Grossly normal heart sounds.  Good peripheral circulation. Respiratory: Normal respiratory effort.  No retractions. Lungs CTAB and moving good air Gastrointestinal: Soft nondistended nontender no rebound or guarding no peritonitis no McBurney's tenderness negative Rovsing's Musculoskeletal: No lower extremity edema   Neurologic:  Normal speech and language. No gross focal neurologic deficits are appreciated. Skin:  Skin is warm, dry and intact. No rash noted. Psychiatric: Mood and  affect are normal. Speech and behavior are normal.    ____________________________________________   DIFFERENTIAL includes but not limited to  H. pylori, perforated ulcer, GI bleed, appendicitis, diverticulitis, volvulus, obstruction ____________________________________________   LABS (all labs ordered are listed, but only abnormal results are displayed)  Labs Reviewed  COMPREHENSIVE METABOLIC PANEL - Abnormal; Notable for the following:       Result Value   Potassium 3.2 (*)    Glucose, Bld 106 (*)    All other components within normal limits  URINALYSIS, COMPLETE (UACMP) WITH MICROSCOPIC - Abnormal; Notable for the following:    Color, Urine STRAW (*)    APPearance CLEAR (*)    Specific Gravity, Urine 1.004 (*)    Squamous Epithelial / LPF 0-5 (*)    All other components within normal limits  LIPASE, BLOOD  CBC    Normal hemoglobin blood work  unremarkable __________________________________________  EKG   ____________________________________________  RADIOLOGY   ____________________________________________   PROCEDURES  Procedure(s) performed: no  Procedures  Critical Care performed: no  Observation: no ____________________________________________   INITIAL IMPRESSION / ASSESSMENT AND PLAN / ED COURSE  Pertinent labs & imaging results that were available during my care of the patient were reviewed by me and considered in my medical decision making (see chart for details).  Chart review the patient actually has a recent diagnosis of H. pylori and is receiving triple therapy. She does not have an intestinal infection. Her abdomen is benign and no peritonitis. She has no evidence of perforation. I given the patient reassurance and encouraged her to continue taking all of her medications as prescribed. She is discharged home with follow-up as scheduled in good condition.      ____________________________________________   FINAL CLINICAL IMPRESSION(S) / ED DIAGNOSES  Final diagnoses:  H pylori ulcer      NEW MEDICATIONS STARTED DURING THIS VISIT:  New Prescriptions   No medications on file     Note:  This document was prepared using Dragon voice recognition software and may include unintentional dictation errors.     Darel Hong, MD 11/25/16 1818

## 2016-11-28 ENCOUNTER — Telehealth: Payer: Self-pay

## 2016-11-28 NOTE — Telephone Encounter (Signed)
Spoke to pt who states her stomach is "still swollen and hurting." She was Dx with an ulcer but was not advised on how to proceed.

## 2016-11-28 NOTE — Telephone Encounter (Signed)
She is on treatment for H pylori and needs to finish the 2 weeks of meds she got Will need follow up appt if not feeling better (and she should be seen here or by GI anyway)

## 2016-11-28 NOTE — Telephone Encounter (Signed)
Notes reviewed  Was seen and sent home  Please check on her today

## 2016-11-28 NOTE — Telephone Encounter (Signed)
PLEASE NOTE: All timestamps contained within this report are represented as [place] Standard Time. CONFIDENTIALTY NOTICE: This fax transmission is intended only for the addressee. It contains information that is legally privileged, confidential or otherwise protected from use or disclosure. If you are not the intended recipient, you are strictly prohibited from reviewing, disclosing, copying using or disseminating any of this information or taking any action in reliance on or regarding this information. If you have received this fax in error, please notify us immediately by telephone so that we can arrange for its return to [place]. Phone: [phone], Toll-Free: [phone], Fax: [phone] Page: 1 of 2 Call Id: [id number] [place] Patient Name: Lindsey Ward Gender: Female DOB: 14-Dec-1950 Age: 66 Y 2 M 30 D Return Phone Number: [phone] (Primary) City/State/Zip: [place] [address] Client [facility] - Client Client Site [place] - Day Physician Viviana Simpler - MD Who Is Calling Patient / Member / Family / Caregiver Call Type Triage / Clinical Relationship To Patient Self Return Phone Number [phone] (Primary) Chief Complaint Abdominal Pain Reason for Call Symptomatic / Request for [name] is taking abx Clarithromycln. Experiencing nausea and pain in abdomen. Appointment Disposition EMR Appointment Not Necessary Info pasted into Epic No Nurse Assessment Nurse: Cherie Dark, RN, Jarrett Soho Date/Time (Eastern Time): 11/25/2016 3:47:09 PM Confirm and document reason for call. If symptomatic, describe symptoms. ---Caller states she is taking two antibiotics for a bacteria in her intestines and has been prescribed Clarithromycinl and Ampicillin. She has having some nausea and some stomach pain. Has been on  these meds since 9/11. Has some pain from her ribs down. Does the PT have any chronic conditions? (i.e. diabetes, asthma, etc.) ---Yes List chronic conditions. ---fibromyalgia, anxiety and panic attacks, Guidelines Guideline Title Affirmed Question Abdominal Pain - Female Shock suspected (e.g., cold/pale/clammy skin, too weak to stand, low BP, rapid pulse) Disp. Time [name] Time) Disposition Final User 11/25/2016 3:54:22 PM Call EMS 911 Now Yes [place], RN, Jarrett Soho Referrals GO TO FACILITY UNDECIDED Care Advice Given Per Guideline CALL EMS 911 NOW: Immediate medical attention is needed. You need to hang up and call 911 (or an ambulance). Psychologist, forensic Discretion: I'll call you back in a few minutes to be sure you were able to reach them.) CARE ADVICE given per Abdominal Pain, Female (Adult) guideline. FIRST AID: Lie down with the feet elevated (Reason: counteract shock) Comments User: Creig Hines, RN Date/Time (Eastern Time): 11/25/2016 3:54:20 PM PLEASE NOTE: All timestamps contained within this report are represented as [place] Standard Time. CONFIDENTIALTY NOTICE: This fax transmission is intended only for the addressee. It contains information that is legally privileged, confidential or otherwise protected from use or disclosure. If you are not the intended recipient, you are strictly prohibited from reviewing, disclosing, copying using or disseminating any of this information or taking any action in reliance on or regarding this information. If you have received this fax in error, please notify us immediately by telephone so that we can arrange for its return to [place]. Phone: [phone], Toll-Free: [phone], Fax: [phone] Page: 2 of 2 Call Id: [id number] Comments caller states she is going to have her sister take her to the ER. She does not want to call 911.

## 2016-11-28 NOTE — Telephone Encounter (Signed)
Per chart review tab pt went to [facility] ED on 11/25/16.

## 2016-11-28 NOTE — Telephone Encounter (Signed)
Spoke to pt and advised. Scheduled to see Dr Silvio Pate 12/2016 and states she will also try to schedule an appt with GI

## 2016-11-29 DIAGNOSIS — K5909 Other constipation: Secondary | ICD-10-CM | POA: Diagnosis not present

## 2016-11-29 DIAGNOSIS — A048 Other specified bacterial intestinal infections: Secondary | ICD-10-CM | POA: Diagnosis not present

## 2016-11-29 DIAGNOSIS — R12 Heartburn: Secondary | ICD-10-CM | POA: Diagnosis not present

## 2016-12-19 ENCOUNTER — Encounter: Payer: Self-pay | Admitting: Internal Medicine

## 2016-12-19 ENCOUNTER — Ambulatory Visit (INDEPENDENT_AMBULATORY_CARE_PROVIDER_SITE_OTHER): Payer: Medicare Other | Admitting: Internal Medicine

## 2016-12-19 VITALS — BP 120/82 | HR 66 | Temp 97.9°F | Wt 130.0 lb

## 2016-12-19 DIAGNOSIS — K5900 Constipation, unspecified: Secondary | ICD-10-CM | POA: Diagnosis not present

## 2016-12-19 DIAGNOSIS — F39 Unspecified mood [affective] disorder: Secondary | ICD-10-CM

## 2016-12-19 DIAGNOSIS — Z23 Encounter for immunization: Secondary | ICD-10-CM | POA: Diagnosis not present

## 2016-12-19 NOTE — Assessment & Plan Note (Signed)
Chronic constipation without other findings on extensive evaluation Now seeing another GI doctor  Discussed trying prune juice and increasing water Gave info on FODMAP diet

## 2016-12-19 NOTE — Progress Notes (Signed)
Subjective:    Patient ID: Lindsey Ward, female    DOB: 08-Oct-1950, 66 y.o.   MRN: [id number]  HPI Here due to ongoing GI problems  Trouble going to the bathroom Tried metamucil-- but it works but doesn't stop (then gets watery stool) Has tried miralax --did the same thing (would have leakage) Has not tried prune juice Does get gas at times Some pain--usually RLQ and around to back. No particular time--not after eating or when needs to move bowels  May go just once a week if no laxative Only small caliber stools then  Reviewed benign EGD and colonoscopy Did see new GI doctor at [facility] ---goes back next week  Current Outpatient Prescriptions on File Prior to Visit  Medication Sig Dispense Refill  . ALPRAZolam (XANAX) 1 MG tablet TAKE 1 TABLET BY MOUTH THREE TIMES DAILY AS NEEDED 120 tablet 0  . cetirizine (ZYRTEC) 10 MG tablet TAKE 1 TABLET BY MOUTH EVERY DAY 30 tablet 11  . HYDROcodone-acetaminophen (NORCO/VICODIN) 5-325 MG tablet Take 1 tablet by mouth 3 (three) times daily as needed. 90 tablet 0  . omeprazole (PRILOSEC) 20 MG capsule Take 1 capsule (20 mg total) by mouth 2 (two) times daily. 180 capsule 3  . promethazine (PHENERGAN) 12.5 MG tablet Take 1 tablet (12.5 mg total) by mouth every 8 (eight) hours as needed for nausea or vomiting. 20 tablet 0  . SUMAtriptan (IMITREX) 50 MG tablet Take 0.5-1 tablets (25-50 mg total) by mouth daily as needed for migraine. 10 tablet 5  . triamcinolone cream (KENALOG) 0.1 % APPLY TO INFLAMED LESIONS TWICE DAILY FOR 1 WEEK  1   No current facility-administered medications on file prior to visit.     Allergies  Allergen Reactions  . Tizanidine Other (See Comments)    "throws me in a different world"  . Aspirin     REACTION: GI bleed  . Buspirone Hcl     REACTION: unspecified  . Ciprofloxacin     REACTION: unspecified  . Codeine   . Diazepam   . Duloxetine     REACTION: sz like activity  . Ibuprofen     REACTION: GI bleed    . Oxycodone-Acetaminophen     REACTION: unspecified  . Pregabalin     REACTION: kept her up and confusion  . Propoxyphene Hcl   . Risperidone     REACTION: confusion  . Sulfonamide Derivatives     REACTION: unspecified  . Tramadol Hcl     REACTION: doesn't remember what happened but couldn't tolerate  . Venlafaxine     REACTION: unspecified  . Methocarbamol Rash    Past Medical History:  Diagnosis Date  . Bowel obstruction ([place])   . Chronic fatigue   . Depression   . Fibromyalgia   . GERD (gastroesophageal reflux disease)   . Hyperlipemia   . Migraine   . Obstipation   . Osteopenia   . PUD (peptic ulcer disease)     Past Surgical History:  Procedure Laterality Date  . ABDOMINAL SURGERY    . APPENDECTOMY  2005  . BREAST BIOPSY Bilateral 1999   rt w/out clip - neg, lt w/clip - neg  . BREAST CYST ASPIRATION Right 2003   neg  . CHOLECYSTECTOMY  2004  . COLONOSCOPY WITH PROPOFOL N/A 06/09/2016   Procedure: COLONOSCOPY WITH PROPOFOL;  Surgeon: Jonathon Bellows, MD;  Location: [facility] ENDOSCOPY;  Service: Endoscopy;  Laterality: N/A;  . ESOPHAGOGASTRODUODENOSCOPY (EGD) WITH PROPOFOL N/A 06/09/2016   Procedure:  ESOPHAGOGASTRODUODENOSCOPY (EGD) WITH PROPOFOL;  Surgeon: Jonathon Bellows, MD;  Location: [facility] ENDOSCOPY;  Service: Endoscopy;  Laterality: N/A;  . GIVENS CAPSULE STUDY N/A 06/23/2016   Procedure: GIVENS CAPSULE STUDY;  Surgeon: Jonathon Bellows, MD;  Location: [facility] ENDOSCOPY;  Service: Endoscopy;  Laterality: N/A;  . TOTAL ABDOMINAL HYSTERECTOMY W/ BILATERAL SALPINGOOPHORECTOMY  1986    Family History  Problem Relation Age of Onset  . Heart disease Mother        AMI  . Stroke Mother   . Heart disease Father   . Hypertension Sister   . Breast cancer Sister   . Hypertension Sister   . Heart disease Sister        MI  . Diabetes Sister   . Kidney cancer Neg Hx   . Kidney disease Neg Hx   . Prostate cancer Neg Hx     Social History   Social History  . Marital status: Single     Spouse name: N/A  . Number of children: 1  . Years of education: N/A   Occupational History  . DISABLED    Social History Main Topics  . Smoking status: Former Smoker    Packs/day: 1.00    Years: 40.00    Types: Cigarettes    Quit date: 09/25/2013  . Smokeless tobacco: Never Used     Comment:    . Alcohol use 0.0 oz/week     Comment: rarely  . Drug use: No  . Sexual activity: Not on file   Other Topics Concern  . Not on file   Social History Narrative   Living alone again      No living will   Daughter should make health care decisions   Requests DNR---done 10/24/14   No tube feeds if cognitively unaware   Highest level of education: 9th      Review of Systems No fever Appetite is poor Has lost some weight Gets feeling of bladder fullness--- but then only a little urine. Foul smell Trying to increase the water Migraines are coming back Anxiety and depression are also worse--- nothing really new Off her gabapentin--doesn't seem to have made any difference    Objective:   Physical Exam  Constitutional: She appears well-developed and well-nourished. No distress.  Pulmonary/Chest: Effort normal and breath sounds normal. No respiratory distress. She has no wheezes. She has no rales.  Abdominal: Soft. Bowel sounds are normal. She exhibits no distension and no mass. There is no tenderness. There is no rebound and no guarding.  Psychiatric:  Usual mild anxiety Not clearly depressed          Assessment & Plan:

## 2016-12-19 NOTE — Assessment & Plan Note (Signed)
Ongoing anxiety and dysthymia Discussed ---okay to try melatonin 3-6 mg at bedtime

## 2016-12-19 NOTE — Addendum Note (Signed)
Addended by: Pilar Grammes on: 12/19/2016 05:34 PM   Modules accepted: Orders

## 2016-12-29 DIAGNOSIS — Z8719 Personal history of other diseases of the digestive system: Secondary | ICD-10-CM | POA: Diagnosis not present

## 2016-12-29 DIAGNOSIS — K5909 Other constipation: Secondary | ICD-10-CM | POA: Diagnosis not present

## 2016-12-30 ENCOUNTER — Other Ambulatory Visit: Payer: Self-pay | Admitting: Internal Medicine

## 2017-01-02 ENCOUNTER — Other Ambulatory Visit: Payer: Self-pay | Admitting: Internal Medicine

## 2017-01-02 NOTE — Telephone Encounter (Signed)
Received refill electronically Last refill 11/02/16 #120 Last office visit 12/19/16 See allergy/contraindication

## 2017-01-02 NOTE — Telephone Encounter (Signed)
Approved: okay #120 x 0

## 2017-01-03 NOTE — Telephone Encounter (Signed)
Rx called in to requested pharmacy 

## 2017-01-04 ENCOUNTER — Ambulatory Visit: Payer: Self-pay | Admitting: *Deleted

## 2017-01-04 NOTE — Telephone Encounter (Signed)
  Reason for Disposition . Similar to previously diagnosed migraine headaches  Answer Assessment - Initial Assessment Questions 1. LOCATION: "Where does it hurt?"  Top   And   Sides        2. ONSET: "When did the headache start?" (Minutes, hours or days)      Off   And  On  X   3   Weeks   Started   Today    3. PATTERN: "Does the pain come and go, or has it been constant since it started?"   Comes   And   Goes  Better  Now   4. SEVERITY: "How bad is the pain?" and "What does it keep you from doing?"  (e.g., Scale 1-10; mild, moderate, or severe)   - MILD (1-3): doesn't interfere with normal activities    - MODERATE (4-7): interferes with normal activities or awakens from sleep    - SEVERE (8-10): excruciating pain, unable to do any normal activities        7 5. RECURRENT SYMPTOM: "Have you ever had headaches before?" If so, ask: "When was the last time?" and "What happened that time?"      History  Of  migraines 6. CAUSE: "What do you think is causing the headache?"     migraine 7. MIGRAINE: "Have you been diagnosed with migraine headaches?" If so, ask: "Is this headache similar?"     yes 8. HEAD INJURY: "Has there been any recent injury to the head?"      No 9. OTHER SYMPTOMS: "Do you have any other symptoms?" (fever, stiff neck, eye pain, sore throat, cold symptoms)      Chronic neck pain 10. PREGNANCY: "Is there any chance you are pregnant?" "When was your last menstrual period?"      no  Protocols used: HEADACHE-A-AH

## 2017-01-09 ENCOUNTER — Ambulatory Visit: Payer: Self-pay | Admitting: *Deleted

## 2017-01-09 DIAGNOSIS — H35342 Macular cyst, hole, or pseudohole, left eye: Secondary | ICD-10-CM | POA: Diagnosis not present

## 2017-01-09 NOTE — Telephone Encounter (Signed)
This patient states that she has been having ongoing migraines this month. She states that she starts having palpitations and then she takes her xanax for this. She states that she takes Imitrex for her headaches but is having them frequently. She c/o that the pain is on the left side of her head and tender to the touch. She also states that she is having some distorted vision and has an appt to see an eye doctor today. No other symptoms noted.  Patient advised to go  the Urgent Care if she cont to have palpitations or any other symptoms. Pt voiced understanding.

## 2017-01-09 NOTE — Telephone Encounter (Signed)
   Reason for Disposition . [1] MILD-MODERATE headache AND [2] present > 72 hours  Answer Assessment - Initial Assessment Questions 1. LOCATION: "Where does it hurt?"      Left side of your head 2. ONSET: "When did the headache start?" (Minutes, hours or days)      This morning 3. PATTERN: "Does the pain come and go, or has it been constant since it started?"     Comes and goes after take the Imitrex 4. SEVERITY: "How bad is the pain?" and "What does it keep you from doing?"  (e.g., Scale 1-10; mild, moderate, or severe)   - MILD (1-3): doesn't interfere with normal activities    - MODERATE (4-7): interferes with normal activities or awakens from sleep    - SEVERE (8-10): excruciating pain, unable to do any normal activities        4, just started and the later it gets its stronger 5. RECURRENT SYMPTOM: "Have you ever had headaches before?" If so, ask: "When was the last time?" and "What happened that time?"      Yes, on October 26th 6. CAUSE: "What do you think is causing the headache?"     So much stress for the last 5 years 7. MIGRAINE: "Have you been diagnosed with migraine headaches?" If so, ask: "Is this headache similar?"      Yes and yes 8. HEAD INJURY: "Has there been any recent injury to the head?"      no 9. OTHER SYMPTOMS: "Do you have any other symptoms?" (fever, stiff neck, eye pain, sore throat, cold symptoms)     Distorted vision, neck stiff 10. PREGNANCY: "Is there any chance you are pregnant?" "When was your last menstrual period?"       no  Protocols used: HEADACHE-A-AH

## 2017-01-10 ENCOUNTER — Encounter: Payer: Self-pay | Admitting: Internal Medicine

## 2017-01-10 DIAGNOSIS — H35342 Macular cyst, hole, or pseudohole, left eye: Secondary | ICD-10-CM | POA: Diagnosis not present

## 2017-01-10 DIAGNOSIS — H43812 Vitreous degeneration, left eye: Secondary | ICD-10-CM | POA: Diagnosis not present

## 2017-01-12 HISTORY — PX: PARS PLANA VITRECTOMY W/ REPAIR OF MACULAR HOLE: SHX2170

## 2017-01-18 DIAGNOSIS — H35342 Macular cyst, hole, or pseudohole, left eye: Secondary | ICD-10-CM | POA: Diagnosis not present

## 2017-01-18 DIAGNOSIS — H43819 Vitreous degeneration, unspecified eye: Secondary | ICD-10-CM | POA: Diagnosis not present

## 2017-01-18 DIAGNOSIS — K219 Gastro-esophageal reflux disease without esophagitis: Secondary | ICD-10-CM | POA: Diagnosis not present

## 2017-01-18 DIAGNOSIS — I10 Essential (primary) hypertension: Secondary | ICD-10-CM | POA: Diagnosis not present

## 2017-01-18 DIAGNOSIS — H35372 Puckering of macula, left eye: Secondary | ICD-10-CM | POA: Diagnosis not present

## 2017-01-19 DIAGNOSIS — H35342 Macular cyst, hole, or pseudohole, left eye: Secondary | ICD-10-CM | POA: Insufficient documentation

## 2017-01-20 ENCOUNTER — Encounter: Payer: Self-pay | Admitting: Internal Medicine

## 2017-02-21 ENCOUNTER — Other Ambulatory Visit: Payer: Self-pay | Admitting: Internal Medicine

## 2017-02-22 NOTE — Telephone Encounter (Signed)
Last filled 01-03-17 #120 Last OV 12-19-16 Next OV 03-20-17

## 2017-02-22 NOTE — Telephone Encounter (Signed)
Rx called in to requested pharmacy 

## 2017-02-22 NOTE — Telephone Encounter (Signed)
Approved: #[id number] x 0 

## 2017-02-28 DIAGNOSIS — H35342 Macular cyst, hole, or pseudohole, left eye: Secondary | ICD-10-CM | POA: Diagnosis not present

## 2017-03-13 ENCOUNTER — Other Ambulatory Visit: Payer: Self-pay | Admitting: Internal Medicine

## 2017-03-13 NOTE — Telephone Encounter (Signed)
Copied from [place] [phone]. Topic: Quick Communication - See Telephone Encounter >> Mar 13, 2017 10:33 AM Ether Griffins B wrote: CRM for notification. See Telephone encounter for:  Pt needing refill on hydrocodone  03/13/17.

## 2017-03-13 NOTE — Telephone Encounter (Signed)
Last Rx 09/08/2016. Last OV 12/2016-acute. pls advise

## 2017-03-14 MED ORDER — HYDROCODONE-ACETAMINOPHEN 5-325 MG PO TABS: 1.0000 | ORAL_TABLET | Freq: Three times a day (TID) | ORAL | 0 refills | Status: DC | PRN

## 2017-03-14 NOTE — Telephone Encounter (Signed)
Will refill #10. Rest to come from PCP. Has appt next week. Sent electronically. Routed to PCP. plz notify patient.

## 2017-03-20 ENCOUNTER — Ambulatory Visit (INDEPENDENT_AMBULATORY_CARE_PROVIDER_SITE_OTHER): Payer: Medicare Other | Admitting: Internal Medicine

## 2017-03-20 ENCOUNTER — Encounter: Payer: Self-pay | Admitting: Internal Medicine

## 2017-03-20 VITALS — BP 108/60 | HR 67 | Temp 97.6°F | Ht 62.25 in | Wt 128.0 lb

## 2017-03-20 DIAGNOSIS — F39 Unspecified mood [affective] disorder: Secondary | ICD-10-CM | POA: Diagnosis not present

## 2017-03-20 DIAGNOSIS — K219 Gastro-esophageal reflux disease without esophagitis: Secondary | ICD-10-CM

## 2017-03-20 DIAGNOSIS — Z7189 Other specified counseling: Secondary | ICD-10-CM | POA: Diagnosis not present

## 2017-03-20 DIAGNOSIS — Z0000 Encounter for general adult medical examination without abnormal findings: Secondary | ICD-10-CM | POA: Diagnosis not present

## 2017-03-20 DIAGNOSIS — G43009 Migraine without aura, not intractable, without status migrainosus: Secondary | ICD-10-CM

## 2017-03-20 DIAGNOSIS — M797 Fibromyalgia: Secondary | ICD-10-CM

## 2017-03-20 MED ORDER — SUMATRIPTAN SUCCINATE 50 MG PO TABS: ORAL_TABLET | ORAL | 11 refills | Status: DC

## 2017-03-20 MED ORDER — PROMETHAZINE HCL 12.5 MG PO TABS: 12.5000 mg | ORAL_TABLET | Freq: Three times a day (TID) | ORAL | 0 refills | Status: DC | PRN

## 2017-03-20 MED ORDER — HYDROCODONE-ACETAMINOPHEN 5-325 MG PO TABS: 1.0000 | ORAL_TABLET | Freq: Three times a day (TID) | ORAL | 0 refills | Status: DC | PRN

## 2017-03-20 NOTE — Assessment & Plan Note (Signed)
Quiet on the PPI 

## 2017-03-20 NOTE — Assessment & Plan Note (Signed)
Chronic pain syndrome Will refill her hydrocodone--but hasn't needed it in 6 months CSRS reviewed---no concerns Will check UDS

## 2017-03-20 NOTE — Progress Notes (Signed)
Hearing Screening   Method: Audiometry   125Hz  250Hz  500Hz  1000Hz  2000Hz  3000Hz  4000Hz  6000Hz  8000Hz   Right ear:   20 20 20  20     Left ear:   20 20 20  20     Vision Screening Comments: November 2018 Retina Detachment Surgery 01-19-17

## 2017-03-20 NOTE — Assessment & Plan Note (Signed)
Chronic anxiety/panic --and dysthymia Doing okay with just the alprazolam

## 2017-03-20 NOTE — Assessment & Plan Note (Signed)
2-3 headaches a month usually Okay with the meds

## 2017-03-20 NOTE — Assessment & Plan Note (Signed)
See social history °Has DNR °

## 2017-03-20 NOTE — Assessment & Plan Note (Signed)
I have personally reviewed the Medicare Annual Wellness questionnaire and have noted 1. The patient's medical and social history 2. Their use of alcohol, tobacco or illicit drugs 3. Their current medications and supplements 4. The patient's functional ability including ADL's, fall risks, home safety risks and hearing or visual             impairment. 5. Diet and physical activities 6. Evidence for depression or mood disorders  The patients weight, height, BMI and visual acuity have been recorded in the chart I have made referrals, counseling and provided education to the patient based review of the above and I have provided the pt with a written personalized care plan for preventive services.  I have provided you with a copy of your personalized plan for preventive services. Please take the time to review along with your updated medication list.  Will continue yearly mammograms due to [place] Recent colonoscopy Prefers no Td --due to cost (discussed booster if she has injury) Discussed exercise

## 2017-03-20 NOTE — Progress Notes (Signed)
Subjective:    Patient ID: Lindsey Ward, female    DOB: 1950/12/24, 67 y.o.   MRN: [id number]  HPI Here for Medicare wellness visit and follow up of chronic health conditions Reviewed other doctors--Dr Roosevelt Locks (ophtho), Dr Alice Reichert (GI), Dr Tamala Julian (dentist) Reviewed advanced directivesVery rare alcohol (1 beer all year)--no tobacco Vision is okay now Hearing is fine No set exercise---tries to walk 30 minutes when she can Did fall once--on unlighted porch. Did injure her knee Independent with instrumental ADLs Chronic memory issues-- because she does things too quickly or doesn't pay attention  No new concerns Ongoing headaches--- usually 2-3 times a month imitrex does help and uses the nausea meds as well Has also used the alprazolam when the sumatriptan didn't help  Ongoing anxiety Takes alprazolam every night to get her calmed down for sleep Will use it in day for anxiety/panic attack Had depression around the holidays--just once in a while. Not getting much pleasure ---this is not new "I am okay...Marland KitchenMarland KitchenMarland Kitchen I will have my cry out and then I am okay" This goes back at least 5 years  Had left eye vitrectomy Still has gas bubble in it (for retinal detachment) Had trouble with peripheral vision-- stopped driving due to this. Now back to driving after the procedure  Did have GI work up due to anemia EGD and colon Now seeing different GI at [facility] No N/V Ongoing constipation---no meds for this Continues on omeprazole--this controls heartburn (except rare). No dysphagia  Ongoing back and neck pain Disc disease Has hydrocodone for rare use  Current Outpatient Medications on File Prior to Visit  Medication Sig Dispense Refill  . ALPRAZolam (XANAX) 1 MG tablet TAKE 1 TABLET BY MOUTH THREE TIMES A DAY AS NEEDED 120 tablet 0  . cetirizine (ZYRTEC) 10 MG tablet TAKE 1 TABLET BY MOUTH EVERY DAY 30 tablet 11  . HYDROcodone-acetaminophen (NORCO/VICODIN) 5-325 MG tablet Take 1 tablet by mouth  3 (three) times daily as needed. 10 tablet 0  . omeprazole (PRILOSEC) 20 MG capsule Take 1 capsule (20 mg total) by mouth 2 (two) times daily. 180 capsule 3  . promethazine (PHENERGAN) 12.5 MG tablet Take 1 tablet (12.5 mg total) by mouth every 8 (eight) hours as needed for nausea or vomiting. 20 tablet 0  . SUMAtriptan (IMITREX) 50 MG tablet TAKE 0.5-1 TABLET BY MOUTH DAILY AS NEEDED FOR MIGRAINE. 10 tablet 3  . triamcinolone cream (KENALOG) 0.1 % APPLY TO INFLAMED LESIONS TWICE DAILY FOR 1 WEEK  1   No current facility-administered medications on file prior to visit.     Allergies  Allergen Reactions  . Tizanidine Other (See Comments)    "throws me in a different world"  . Aspirin     REACTION: GI bleed  . Buspirone Hcl     REACTION: unspecified  . Ciprofloxacin     REACTION: unspecified  . Codeine   . Diazepam   . Duloxetine     REACTION: sz like activity  . Ibuprofen     REACTION: GI bleed  . Oxycodone-Acetaminophen     REACTION: unspecified  . Pregabalin     REACTION: kept her up and confusion  . Propoxyphene Hcl   . Risperidone     REACTION: confusion  . Sulfonamide Derivatives     REACTION: unspecified  . Tramadol Hcl     REACTION: doesn't remember what happened but couldn't tolerate  . Venlafaxine     REACTION: unspecified  . Methocarbamol Rash  Past Medical History:  Diagnosis Date  . Bowel obstruction ([place])   . Chronic fatigue   . Depression   . Fibromyalgia   . GERD (gastroesophageal reflux disease)   . Hyperlipemia   . Migraine   . Obstipation   . Osteopenia   . PUD (peptic ulcer disease)     Past Surgical History:  Procedure Laterality Date  . ABDOMINAL SURGERY    . APPENDECTOMY  2005  . BREAST BIOPSY Bilateral 1999   rt w/out clip - neg, lt w/clip - neg  . BREAST CYST ASPIRATION Right 2003   neg  . CHOLECYSTECTOMY  2004  . COLONOSCOPY WITH PROPOFOL N/A 06/09/2016   Procedure: COLONOSCOPY WITH PROPOFOL;  Surgeon: Jonathon Bellows, MD;   [facility] END[facility]COPY;  Service: Endoscopy;  Laterality: N/A;  . ESOPHAGOGASTRODUODENOSCOPY (EGD) WITH PROPOFOL N/A 06/09/2016   Procedure: ESOPHAGOGASTRODUODENOSCOPY (EGD) WITH PROPOFOL;  Surgeon: Jonathon Bellows, MD;  Location: [facility] ENDOSCOPY;  Service: Endoscopy;  Laterality: N/A;  . GIVENS CAPSULE STUDY N/A 06/23/2016   Procedure: GIVENS CAPSULE STUDY;  Surgeon: Jonathon Bellows, MD;  Location: [facility] ENDOSCOPY;  Service: Endoscopy;  Laterality: N/A;  . PARS PLANA VITRECTOMY W/ REPAIR OF MACULAR HOLE Left 01/2017   [place]  . TOTAL ABDOMINAL HYSTERECTOMY W/ BILATERAL SALPINGOOPHORECTOMY  1986    Family History  Problem Relation Age of Onset  . Heart disease Mother        AMI  . Stroke Mother   . Heart disease Father   . Hypertension Sister   . Breast cancer Sister   . Hypertension Sister   . Heart disease Sister        MI  . Diabetes Sister   . Kidney cancer Neg Hx   . Kidney disease Neg Hx   . Prostate cancer Neg Hx     Social History   Socioeconomic History  . Marital status: Single    Spouse name: Not on file  . Number of children: 1  . Years of education: Not on file  . Highest education level: Not on file  Social Needs  . Financial resource strain: Not on file  . Food insecurity - worry: Not on file  . Food insecurity - inability: Not on file  . Transportation needs - medical: Not on file  . Transportation needs - non-medical: Not on file  Occupational History  . Occupation: DISABLED  Tobacco Use  . Smoking status: Former Smoker    Packs/day: 1.00    Years: 40.00    Pack years: 40.00    Types: Cigarettes    Last attempt to quit: 09/25/2013    Years since quitting: 3.4  . Smokeless tobacco: Never Used  . Tobacco comment:    Substance and Sexual Activity  . Alcohol use: Yes    Alcohol/week: 0.0 oz    Comment: rarely  . Drug use: No  . Sexual activity: Not on file  Other Topics Concern  . Not on file  Social History Narrative   Living alone again      No living  will   Daughter should make health care decisions   Requests DNR---done 10/24/14   No tube feeds if cognitively unaware   Highest level of education: 9th   Review of Systems Teeth are okay Wears seat belt Sleeps till not great--at best 4-5 hours Appetite is not great--- weight down slightly (she was trying).  No urinary problems--but does have nocturia x 2-3 (gets urgency but then doesn't go much) Gets slight rash  on back of neck--comes and goes. No dermatologist lately    Objective:   Physical Exam  Constitutional: She is oriented to person, place, and time. She appears well-developed. No distress.  Neck: No thyromegaly present.  Cardiovascular: Normal rate, regular rhythm, normal heart sounds and intact distal pulses. Exam reveals no gallop.  No murmur heard. Pulmonary/Chest: Effort normal and breath sounds normal. No respiratory distress. She has no wheezes. She has no rales.  Abdominal: Soft. She exhibits no distension. There is no tenderness. There is no rebound.  Musculoskeletal: She exhibits no edema or tenderness.  Lymphadenopathy:    She has no cervical adenopathy.  Neurological: She is alert and oriented to person, place, and time.  President--- "Daisy Floro, Obama, Bush" [phone] D-l-r-[id number]-w Recall 3/3  Skin: No rash noted. No erythema.  Psychiatric: She has a normal mood and affect. Her behavior is normal.          Assessment & Plan:

## 2017-03-23 LAB — PAIN MGMT, PROFILE 8 W/CONF, U
6 Acetylmorphine: NEGATIVE ng/mL (ref <10)
Alcohol Metabolites: NEGATIVE ng/mL (ref <500)
Alphahydroxyalprazolam: 141 ng/mL — ABNORMAL HIGH (ref <25)
Alphahydroxymidazolam: NEGATIVE ng/mL (ref <50)
Alphahydroxytriazolam: NEGATIVE ng/mL (ref <50)
Aminoclonazepam: NEGATIVE ng/mL (ref <25)
Amphetamines: NEGATIVE ng/mL (ref <500)
Benzodiazepines: POSITIVE ng/mL — AB (ref <100)
Buprenorphine, Urine: NEGATIVE ng/mL (ref <5)
Cocaine Metabolite: NEGATIVE ng/mL (ref <150)
Codeine: NEGATIVE ng/mL (ref <50)
Creatinine: 19.1 mg/dL — ABNORMAL LOW
Hydrocodone: NEGATIVE ng/mL (ref <50)
Hydromorphone: NEGATIVE ng/mL (ref <50)
Hydroxyethylflurazepam: NEGATIVE ng/mL (ref <50)
Lorazepam: NEGATIVE ng/mL (ref <50)
MDMA: NEGATIVE ng/mL (ref <500)
Marijuana Metabolite: NEGATIVE ng/mL (ref <20)
Morphine: NEGATIVE ng/mL (ref <50)
Nordiazepam: NEGATIVE ng/mL (ref <50)
Norhydrocodone: 74 ng/mL — ABNORMAL HIGH (ref <50)
Opiates: POSITIVE ng/mL — AB (ref <100)
Oxazepam: NEGATIVE ng/mL (ref <50)
Oxidant: NEGATIVE ug/mL (ref <200)
Oxycodone: NEGATIVE ng/mL (ref <100)
Specific Gravity: 1.004 (ref >=1.0)
Temazepam: NEGATIVE ng/mL (ref <50)
pH: 6.99 (ref 4.5–9.0)

## 2017-03-30 DIAGNOSIS — K5730 Diverticulosis of large intestine without perforation or abscess without bleeding: Secondary | ICD-10-CM | POA: Diagnosis not present

## 2017-03-30 DIAGNOSIS — Z862 Personal history of diseases of the blood and blood-forming organs and certain disorders involving the immune mechanism: Secondary | ICD-10-CM | POA: Diagnosis not present

## 2017-05-01 ENCOUNTER — Ambulatory Visit: Payer: Self-pay | Admitting: *Deleted

## 2017-05-01 NOTE — Telephone Encounter (Signed)
Called in c/o having a sore on the left side of her tongue where she bit it accidentally while eating Friday evening.  It is red with a yellow middle and it hurts down into the left side of my throat.  She is using salt water rinses.   I've had these before but never this bad.  I made her an appt with Dr. Silvio Pate for 05/04/17 at 4:30 which was the next available appts.   She wanted to keep the appt but said she may go to the urgent care center if her tongue is not better before her appt.   She stated she would call us back and cancel the appt if she decided to go to the urgent care center.   I instructed her to call us back if she got worse.   She verbalized understanding.  Reason for Disposition . Probable canker sore(s)  Answer Assessment - Initial Assessment Questions 1. LOCATION: "Where is the ulcer located?"      Red sore with a yellow middle on the left side of my tongue.   The pain is going down into my throat.    I was eating Friday night and accidentally bit my tongue. 2. NUMBER: "How many ulcers are there?"      I only see one. 3. SIZE: "How large is the ulcer?"      About the size of a pencil eraser.    4. SEVERITY: "Are they painful?" If so, ask: "How bad is it?"  (Scale 1-10; or mild, moderate, severe)  - MILD - eating  and drinking normally   - MODERATE - decreased liquid intake   - SEVERE - drinking very little      I'm not able to eat anything today because it hurts too bad. 5. ONSET: "When did you first notice the ulcer?"      Friday evening.   I've been using salt water swishes and [employer] mouth rinse. 6. RECURRENT SYMPTOM: "Have you had a mouth ulcer before?" If so, ask: "When was the last time?" and "What happened that time?"      Yes but never this bad. 7. CAUSE: "What do you think is causing the mouth ulcer?"     I bit my tongue Friday evening by accident. 8. OTHER SYMPTOMS: "Do you have any other symptoms?" (e.g., fever)     No fever.   I might have had a low grade  fever last night. 9. PREGNANCY: "Is there any chance you are pregnant?" "When was your last menstrual period?"     Not asked due to age.  Protocols used: MOUTH ULCERS-A-AH

## 2017-05-02 DIAGNOSIS — K120 Recurrent oral aphthae: Secondary | ICD-10-CM | POA: Diagnosis not present

## 2017-05-04 ENCOUNTER — Ambulatory Visit: Payer: Medicare Other | Admitting: Internal Medicine

## 2017-05-12 ENCOUNTER — Other Ambulatory Visit: Payer: Self-pay | Admitting: Internal Medicine

## 2017-05-12 ENCOUNTER — Telehealth: Payer: Self-pay | Admitting: Internal Medicine

## 2017-05-12 NOTE — Telephone Encounter (Signed)
Dr Silvio Pate is out of office with no computer access until 05/22/17. Pt last annual 03/20/17 and last ferritin level done 09/08/16.Please advise.

## 2017-05-12 NOTE — Telephone Encounter (Signed)
Last filled 02-22-17 #120 Last OV 03-20-17 Next OV 03-22-18

## 2017-05-12 NOTE — Telephone Encounter (Signed)
This can wait for PCP's return. If any dizziness, chest pain , shortness of breath.. Needs to make appt to be seen.

## 2017-05-12 NOTE — Telephone Encounter (Signed)
Copied from [place] [id number]. Topic: Quick Communication - See Telephone Encounter >> May 12, 2017 11:29 AM Percell Belt A wrote: CRM for notification. See Telephone encounter for: pt called in and requested a order to be put in to check her iron levels.  I told her she would probably needs an appt but she wanted me to check and see if it could be done without and appt?    Best number [phone]  05/12/17.

## 2017-05-22 NOTE — Telephone Encounter (Signed)
Her blood counts have been very good---no anemia Why does she think she needs her iron checked?

## 2017-05-22 NOTE — Telephone Encounter (Signed)
Spoke to pt. She said she had woke up that morning and had no energy. Got better after 2 days. She has a stomach virus since 05-19-17.

## 2017-05-31 ENCOUNTER — Other Ambulatory Visit: Payer: Self-pay | Admitting: Internal Medicine

## 2017-05-31 DIAGNOSIS — Z1231 Encounter for screening mammogram for malignant neoplasm of breast: Secondary | ICD-10-CM

## 2017-06-06 DIAGNOSIS — H35342 Macular cyst, hole, or pseudohole, left eye: Secondary | ICD-10-CM | POA: Diagnosis not present

## 2017-06-15 ENCOUNTER — Other Ambulatory Visit: Payer: Self-pay | Admitting: Internal Medicine

## 2017-06-15 MED ORDER — HYDROCODONE-ACETAMINOPHEN 5-325 MG PO TABS: 1.0000 | ORAL_TABLET | Freq: Three times a day (TID) | ORAL | 0 refills | Status: DC | PRN

## 2017-06-15 NOTE — Telephone Encounter (Signed)
Rx refill request: hydrocodone- acetaminophen 5-325 mg  LOV: 03/20/17  PCP: [place]: verified

## 2017-06-15 NOTE — Telephone Encounter (Signed)
  Pt requesting refill Hydrocodone apap Last refilled and qty;# 90 on 03/20/17 Last seen:03/20/17 [facility] UDS: 03/20/17

## 2017-06-15 NOTE — Telephone Encounter (Signed)
Copied from [place]. Topic: Quick Communication - Rx Refill/Question >> Jun 15, 2017  3:38 PM Aurelio Brash B wrote: Medication: HYDROcodone-acetaminophen (NORCO/VICODIN) 5-325 MG tablet  Has the patient contacted their pharmacy? yes-  she states it is not due to be refilled until tomorrow  (Agent: If no, request that the patient contact the pharmacy for the refill.)  Preferred Pharmacy (with phone number or street name): [id number] [name], [place] [phone] (Phone) [phone] (Fax)       Agent: Please be advised that RX refills may take up to 3 business days. We ask that you follow-up with your pharmacy.

## 2017-06-20 ENCOUNTER — Ambulatory Visit
Admission: RE | Admit: 2017-06-20 | Discharge: 2017-06-20 | Disposition: A | Discharge status: Home / Self Care | Payer: Medicare HMO | Source: Ambulatory Visit | Attending: Internal Medicine | Admitting: Internal Medicine

## 2017-06-20 DIAGNOSIS — Z1231 Encounter for screening mammogram for malignant neoplasm of breast: Secondary | ICD-10-CM | POA: Diagnosis not present

## 2017-06-20 DIAGNOSIS — R921 Mammographic calcification found on diagnostic imaging of breast: Secondary | ICD-10-CM | POA: Insufficient documentation

## 2017-06-21 ENCOUNTER — Other Ambulatory Visit: Payer: Self-pay | Admitting: Internal Medicine

## 2017-06-21 DIAGNOSIS — N6310 Unspecified lump in the right breast, unspecified quadrant: Secondary | ICD-10-CM

## 2017-06-21 DIAGNOSIS — R928 Other abnormal and inconclusive findings on diagnostic imaging of breast: Secondary | ICD-10-CM

## 2017-06-30 ENCOUNTER — Ambulatory Visit
Admission: RE | Admit: 2017-06-30 | Discharge: 2017-06-30 | Disposition: A | Discharge status: Home / Self Care | Payer: Medicare HMO | Source: Ambulatory Visit | Attending: Internal Medicine | Admitting: Internal Medicine

## 2017-06-30 DIAGNOSIS — R928 Other abnormal and inconclusive findings on diagnostic imaging of breast: Secondary | ICD-10-CM | POA: Insufficient documentation

## 2017-06-30 DIAGNOSIS — N6310 Unspecified lump in the right breast, unspecified quadrant: Secondary | ICD-10-CM | POA: Insufficient documentation

## 2017-06-30 DIAGNOSIS — R922 Inconclusive mammogram: Secondary | ICD-10-CM | POA: Diagnosis not present

## 2017-07-10 ENCOUNTER — Telehealth: Payer: Self-pay

## 2017-07-10 DIAGNOSIS — H268 Other specified cataract: Secondary | ICD-10-CM

## 2017-07-10 NOTE — Telephone Encounter (Signed)
Pt had annual exam 03/20/17.

## 2017-07-10 NOTE — Telephone Encounter (Signed)
Copied from [place] [id number]. Topic: Referral - Request >> Jul 10, 2017  9:39 AM Yvette Rack wrote: Reason for CRM: patient states that her insurance called her and told her that she has to have a referral for cataract surgery made by her primary Doctor she will be going next month on May the 7th to get her eyes checked the Doctor is Dr Cliffton Asters in [place] across from [facility]  >> Jul 10, 2017  9:44 AM Yvette Rack wrote: [name] pt will bring new insurance card to practice today

## 2017-07-10 NOTE — Telephone Encounter (Signed)
Will work on [employer]

## 2017-07-10 NOTE — Telephone Encounter (Signed)
Please put in the referral for me

## 2017-07-17 ENCOUNTER — Other Ambulatory Visit: Payer: Self-pay | Admitting: Internal Medicine

## 2017-07-18 DIAGNOSIS — H2513 Age-related nuclear cataract, bilateral: Secondary | ICD-10-CM | POA: Diagnosis not present

## 2017-07-18 DIAGNOSIS — H18413 Arcus senilis, bilateral: Secondary | ICD-10-CM | POA: Diagnosis not present

## 2017-07-18 DIAGNOSIS — H2512 Age-related nuclear cataract, left eye: Secondary | ICD-10-CM | POA: Diagnosis not present

## 2017-07-27 DIAGNOSIS — H2512 Age-related nuclear cataract, left eye: Secondary | ICD-10-CM | POA: Diagnosis not present

## 2017-07-27 NOTE — Addendum Note (Signed)
Addended by: Pilar Grammes on: 07/27/2017 09:27 AM   Modules accepted: Orders

## 2017-07-27 NOTE — Telephone Encounter (Signed)
[facility] and the last refill date made it too early to fill it when it was requested on 07-17-17 based on the fill date that was listed on the refill request.  Spoke to pt. She said she has enough medication to last until Monday when Dr Silvio Pate returns and decides what she should get. She was only given #90 in March because she changed to [facility]. She would [facility] She states there are days she ends up taking 3 in a day as the rx states. Was unsure the exact quantity to ask another provider to give her or if they would be comfortable doing a 3 month supply. Pt is fine with waiting until Dr Silvio Pate returns on Monday to decide the 90 day quantity she should receive and [facility]

## 2017-07-27 NOTE — Telephone Encounter (Signed)
Patient wants to know why her xanax was denied when she only has 7 pills left. It says she requested it to early. Please advise. She said she can only 90 day supply through [facility]. Call back is [phone]

## 2017-07-28 DIAGNOSIS — Z961 Presence of intraocular lens: Secondary | ICD-10-CM | POA: Diagnosis not present

## 2017-07-28 DIAGNOSIS — H2511 Age-related nuclear cataract, right eye: Secondary | ICD-10-CM | POA: Diagnosis not present

## 2017-07-28 DIAGNOSIS — H2512 Age-related nuclear cataract, left eye: Secondary | ICD-10-CM | POA: Diagnosis not present

## 2017-07-29 MED ORDER — ALPRAZOLAM 1 MG PO TABS: 1.0000 mg | ORAL_TABLET | Freq: Three times a day (TID) | ORAL | 0 refills | Status: DC | PRN

## 2017-07-29 NOTE — Addendum Note (Signed)
Addended by: Viviana Simpler I on: 07/29/2017 08:20 AM   Modules accepted: Orders

## 2017-08-11 DIAGNOSIS — H2511 Age-related nuclear cataract, right eye: Secondary | ICD-10-CM | POA: Diagnosis not present

## 2017-08-24 DIAGNOSIS — H02051 Trichiasis without entropian right upper eyelid: Secondary | ICD-10-CM | POA: Diagnosis not present

## 2017-09-04 ENCOUNTER — Telehealth: Payer: Self-pay | Admitting: *Deleted

## 2017-09-04 DIAGNOSIS — D509 Iron deficiency anemia, unspecified: Secondary | ICD-10-CM

## 2017-09-04 NOTE — Telephone Encounter (Signed)
She can have an appt with lab and feraheme next week if we have room.

## 2017-09-04 NOTE — Telephone Encounter (Signed)
Patient last seen by doctor 06/06/16 she no showed for one appointment and cancelled her last appointment stating she would call back to reschedule it, but never did, that was in June 2018. She is now calling wanting to be seen as she feels her iron is low and she needs something done for it. Please advise.

## 2017-09-05 NOTE — Telephone Encounter (Signed)
She has already been taking care of. This was scheduled earlier this morning. Thank you

## 2017-09-12 ENCOUNTER — Inpatient Hospital Stay: Payer: Medicare HMO

## 2017-09-12 ENCOUNTER — Inpatient Hospital Stay: Payer: Medicare HMO | Attending: Oncology

## 2017-09-12 DIAGNOSIS — D509 Iron deficiency anemia, unspecified: Secondary | ICD-10-CM | POA: Diagnosis not present

## 2017-09-12 LAB — CBC WITH DIFFERENTIAL/PLATELET
Basophils Absolute: 0.1 10*3/uL (ref 0–0.1)
Basophils Relative: 1 %
Eosinophils Absolute: 0.1 10*3/uL (ref 0–0.7)
Eosinophils Relative: 1 %
HCT: 35.4 % (ref 35.0–47.0)
Hemoglobin: 12.2 g/dL (ref 12.0–16.0)
Lymphocytes Relative: 20 %
Lymphs Abs: 1.5 10*3/uL (ref 1.0–3.6)
MCH: 32.2 pg (ref 26.0–34.0)
MCHC: 34.3 g/dL (ref 32.0–36.0)
MCV: 93.8 fL (ref 80.0–100.0)
Monocytes Absolute: 0.5 10*3/uL (ref 0.2–0.9)
Monocytes Relative: 7 %
Neutro Abs: 5.7 10*3/uL (ref 1.4–6.5)
Neutrophils Relative %: 71 %
Platelets: 297 10*3/uL (ref 150–440)
RBC: 3.77 MIL/uL — ABNORMAL LOW (ref 3.80–5.20)
RDW: 12 % (ref 11.5–14.5)
WBC: 7.9 10*3/uL (ref 3.6–11.0)

## 2017-09-12 LAB — IRON AND TIBC
Iron: 77 ug/dL (ref 28–170)
Saturation Ratios: 24 % (ref 10.4–31.8)
TIBC: 327 ug/dL (ref 250–450)
UIBC: 250 ug/dL

## 2017-09-12 LAB — FERRITIN: Ferritin: 34 ng/mL (ref 11–307)

## 2017-09-12 NOTE — Progress Notes (Signed)
Per Dr. Grayland Ormond, no iron for patient today based on lab results, MD/RN will follow up with patient regarding future appointments/plan of care.

## 2017-09-20 ENCOUNTER — Telehealth: Payer: Self-pay | Admitting: *Deleted

## 2017-09-20 NOTE — Telephone Encounter (Signed)
Patient informed that labs are alright and to keep her appointment for October

## 2017-09-20 NOTE — Telephone Encounter (Signed)
Patient called and reports that someone was to call her with her results from 7/2 and she has not heard form anyone. Please advise, next appointment is in October    Ref Range & Units 8d ago 68yr ago  Ferritin 11 - 307 ng/mL 34  250.9          Ref Range & Units 8d ago  Iron 28 - 170 ug/dL 77   TIBC 250 - 450 ug/dL 327   Saturation Ratios 10.4 - 31.8 % 24   UIBC ug/dL 250          Ref Range & Units 8d ago  WBC 3.6 - 11.0 K/uL 7.9   RBC 3.80 - 5.20 MIL/uL 3.77Low    Hemoglobin 12.0 - 16.0 g/dL 12.2   HCT 35.0 - 47.0 % 35.4   MCV 80.0 - 100.0 fL 93.8   MCH 26.0 - 34.0 pg 32.2   MCHC 32.0 - 36.0 g/dL 34.3   RDW 11.5 - 14.5 % 12.0   Platelets 150 - 440 K/uL 297   Neutrophils Relative % % 71   Neutro Abs 1.4 - 6.5 K/uL 5.7   Lymphocytes Relative % 20   Lymphs Abs 1.0 - 3.6 K/uL 1.5   Monocytes Relative % 7   Monocytes Absolute 0.2 - 0.9 K/uL 0.5   Eosinophils Relative % 1   Eosinophils Absolute 0 - 0.7 K/uL 0.1   Basophils Relative % 1   Basophils Absolute 0 - 0.1 K/uL 0.1

## 2017-09-21 ENCOUNTER — Other Ambulatory Visit: Payer: Self-pay | Admitting: Internal Medicine

## 2017-09-21 MED ORDER — HYDROCODONE-ACETAMINOPHEN 5-325 MG PO TABS: 1.0000 | ORAL_TABLET | Freq: Three times a day (TID) | ORAL | 0 refills | Status: DC | PRN

## 2017-09-21 NOTE — Telephone Encounter (Signed)
Copied from [place] [id number]. Topic: Quick Communication - Rx Refill/Question >> Sep 21, 2017 12:08 PM Keene Breath wrote: Medication: HYDROcodone-acetaminophen (NORCO/VICODIN) 5-325 MG tablet  Patient called to request refill for the above medication.  CB# [phone]  Preferred Pharmacy (with phone number or street name): [id number] [name], [place] [phone] (Phone) [phone] (Fax)

## 2017-09-21 NOTE — Telephone Encounter (Signed)
Just check CSRS and document in chart

## 2017-09-21 NOTE — Telephone Encounter (Signed)
Refill of hydrocodone  LRF 06/15/17  #90  0 refills  LOV 03/20/17 Dr. Silvio Pate  [id number] [place], [place]           [phone] (Phone) [phone] (Fax)

## 2017-09-21 NOTE — Telephone Encounter (Signed)
Name of Medication: hydrocodone apap 5-325 Name of Pharmacy: [facility] or Written Date and Quantity: # 36 on 06/15/17 Last Office Visit and Type: 03/20/17 annual Next Office Visit and Type: 03/22/18 AWV Last Controlled Substance Agreement Date: 03/20/17 Last UDS:03/20/17

## 2017-09-22 NOTE — Telephone Encounter (Signed)
CSRS report printed and clear of any other medications from any other providers

## 2017-10-05 ENCOUNTER — Emergency Department: Payer: Medicare HMO

## 2017-10-05 ENCOUNTER — Other Ambulatory Visit: Payer: Self-pay

## 2017-10-05 ENCOUNTER — Ambulatory Visit: Payer: Self-pay | Admitting: *Deleted

## 2017-10-05 ENCOUNTER — Telehealth: Payer: Self-pay | Admitting: Internal Medicine

## 2017-10-05 ENCOUNTER — Emergency Department
Admission: EM | Admit: 2017-10-05 | Discharge: 2017-10-05 | Disposition: A | Discharge status: Home / Self Care | Payer: Medicare HMO | Attending: Emergency Medicine | Admitting: Emergency Medicine

## 2017-10-05 DIAGNOSIS — Z87891 Personal history of nicotine dependence: Secondary | ICD-10-CM | POA: Diagnosis not present

## 2017-10-05 DIAGNOSIS — R079 Chest pain, unspecified: Secondary | ICD-10-CM | POA: Diagnosis not present

## 2017-10-05 DIAGNOSIS — Z79899 Other long term (current) drug therapy: Secondary | ICD-10-CM | POA: Insufficient documentation

## 2017-10-05 DIAGNOSIS — R110 Nausea: Secondary | ICD-10-CM | POA: Diagnosis not present

## 2017-10-05 DIAGNOSIS — R0789 Other chest pain: Secondary | ICD-10-CM | POA: Insufficient documentation

## 2017-10-05 LAB — CBC
HCT: 36.6 % (ref 35.0–47.0)
Hemoglobin: 12.8 g/dL (ref 12.0–16.0)
MCH: 32.6 pg (ref 26.0–34.0)
MCHC: 35 g/dL (ref 32.0–36.0)
MCV: 93.2 fL (ref 80.0–100.0)
Platelets: 289 10*3/uL (ref 150–440)
RBC: 3.92 MIL/uL (ref 3.80–5.20)
RDW: 12.3 % (ref 11.5–14.5)
WBC: 6.6 10*3/uL (ref 3.6–11.0)

## 2017-10-05 LAB — TROPONIN I: Troponin I: <0.03 ng/mL (ref <0.03)

## 2017-10-05 LAB — BASIC METABOLIC PANEL
Anion gap: 5 (ref 5–15)
BUN: 9 mg/dL (ref 8–23)
CO2: 29 mmol/L (ref 22–32)
Calcium: 9.1 mg/dL (ref 8.9–10.3)
Chloride: 107 mmol/L (ref 98–111)
Creatinine, Ser: 0.68 mg/dL (ref 0.44–1.00)
GFR calc Af Amer: >60 mL/min (ref >60)
GFR calc non Af Amer: >60 mL/min (ref >60)
Glucose, Bld: 103 mg/dL — ABNORMAL HIGH (ref 70–99)
Potassium: 3.5 mmol/L (ref 3.5–5.1)
Sodium: 141 mmol/L (ref 135–145)

## 2017-10-05 MED ORDER — IOPAMIDOL (ISOVUE-370) INJECTION 76%
75.0000 mL | Freq: Once | INTRAVENOUS | Status: AC | PRN
  Administered 2017-10-05: 75 mL via INTRAVENOUS

## 2017-10-05 MED ORDER — OMEPRAZOLE 20 MG PO CPDR: 20.0000 mg | DELAYED_RELEASE_CAPSULE | Freq: Two times a day (BID) | ORAL | 1 refills | Status: DC

## 2017-10-05 NOTE — ED Triage Notes (Signed)
First Nurse Note:  C/O chest pain intermittently x 2 weeks.  States currently has only "slight chest discomfort".  Patient states her PCP sent her to ED for evaluation to r/o MI.  Patient is AAOx3.  Skin warm and dry.  No SOB/ DOE.

## 2017-10-05 NOTE — ED Triage Notes (Signed)
Pt c/o CP x 2 weeks with L arm pain. Central CP today. States it will go through whole chest but denies any CP at this time, only arm pain. Nausea. No vomiting.   Alert, oriented, complete sentences spoken. No distress noted.

## 2017-10-05 NOTE — Telephone Encounter (Signed)
Per chart review pt is at [facility] ED now.

## 2017-10-05 NOTE — Telephone Encounter (Signed)
Agree ER appropriate

## 2017-10-05 NOTE — ED Provider Notes (Signed)
[facility] Emergency Department Provider Note  Time seen: 1:34 PM  I have reviewed the triage vital signs and the nursing notes.   HISTORY  Chief Complaint Chest Pain    HPI Lindsey Ward is a 67 y.o. female with a past medical history of depression, fibromyalgia, gastric reflux, hyperlipidemia, presents to the emergency department for chest pain.  According to the patient for the past 2 weeks she is intermittently been experiencing chest pain which she describes as somewhat sharp located in the center of her chest and sometimes to the left side shooting through to her back.  Denies any shortness of breath does state occasional nausea but denies any diaphoresis.  Denies any pain currently.  Patient called her doctor and they recommended she go to the emergency department for evaluation.  Currently the patient appears well, no distress, no complaints at this time.  Largely negative review of systems otherwise.   Past Medical History:  Diagnosis Date  . Bowel obstruction ([place])   . Chronic fatigue   . Depression   . Fibromyalgia   . GERD (gastroesophageal reflux disease)   . Hyperlipemia   . Migraine   . Obstipation   . Osteopenia   . PUD (peptic ulcer disease)     Patient Active Problem List   Diagnosis Date Noted  . Iron deficiency anemia 06/05/2016  . Constipation 05/17/2016  . LPRD (laryngopharyngeal reflux disease) 09/21/2015  . Chronic low back pain with right-sided sciatica 06/17/2015  . Advance directive discussed with patient 10/24/2014  . Routine general medical examination at a health care facility 07/14/2010  . OSTEOPENIA 03/10/2009  . Episodic mood disorder ([place]) 08/15/2006  . Gastroesophageal reflux disease 08/15/2006  . Fibromyalgia 08/15/2006  . Sleep disturbance 08/15/2006  . Hyperlipidemia 12/20/2005  . PANIC ATTACKS 12/20/2005  . Migraine without status migrainosus, not intractable 12/20/2005    Past Surgical History:   Procedure Laterality Date  . ABDOMINAL SURGERY    . APPENDECTOMY  2005  . BREAST BIOPSY Bilateral 1999   rt w/out clip - neg, lt w/clip - neg  . BREAST CYST ASPIRATION Right 2003   neg  . CHOLECYSTECTOMY  2004  . COLONOSCOPY WITH PROPOFOL N/A 06/09/2016   Procedure: COLONOSCOPY WITH PROPOFOL;  Surgeon: Jonathon Bellows, MD;  Location: [facility] ENDOSCOPY;  Service: Endoscopy;  Laterality: N/A;  . ESOPHAGOGASTRODUODENOSCOPY (EGD) WITH PROPOFOL N/A 06/09/2016   Procedure: ESOPHAGOGASTRODUODENOSCOPY (EGD) WITH PROPOFOL;  Surgeon: Jonathon Bellows, MD;  Location: [facility] ENDOSCOPY;  Service: Endoscopy;  Laterality: N/A;  . EYE SURGERY    . GIVENS CAPSULE STUDY N/A 06/23/2016   Procedure: GIVENS CAPSULE STUDY;  Surgeon: Jonathon Bellows, MD;  Location: [facility] ENDOSCOPY;  Service: Endoscopy;  Laterality: N/A;  . PARS PLANA VITRECTOMY W/ REPAIR OF MACULAR HOLE Left 01/2017   [facility]  . TOTAL ABDOMINAL HYSTERECTOMY W/ BILATERAL SALPINGOOPHORECTOMY  1986    Prior to Admission medications   Medication Sig Start Date End Date Taking? Authorizing Provider  ALPRAZolam Duanne Moron) 1 MG tablet Take 1 tablet (1 mg total) by mouth 3 (three) times daily as needed. 07/29/17   Venia Carbon, MD  cetirizine (ZYRTEC) 10 MG tablet TAKE 1 TABLET BY MOUTH EVERY DAY 05/23/16   Venia Carbon, MD  HYDROcodone-acetaminophen (NORCO/VICODIN) 5-325 MG tablet Take 1 tablet by mouth 3 (three) times daily as needed. 09/21/17   Venia Carbon, MD  omeprazole (PRILOSEC) 20 MG capsule Take 1 capsule (20 mg total) by mouth 2 (two) times daily. 05/06/16  Venia Carbon, MD  promethazine (PHENERGAN) 12.5 MG tablet Take 1 tablet (12.5 mg total) by mouth every 8 (eight) hours as needed for nausea or vomiting. 03/20/17   Venia Carbon, MD  SUMAtriptan (IMITREX) 50 MG tablet TAKE 0.5-1 TABLET BY MOUTH DAILY AS NEEDED FOR MIGRAINE. 03/20/17   Viviana Simpler I, MD  triamcinolone cream (KENALOG) 0.1 % APPLY TO INFLAMED LESIONS TWICE DAILY FOR 1 WEEK 06/08/16    [provider]    Allergies  Allergen Reactions  . Tizanidine Other (See Comments)    "throws me in a different world"  . Aspirin     REACTION: GI bleed  . Buspirone Hcl     REACTION: unspecified  . Ciprofloxacin     REACTION: unspecified  . Codeine   . Diazepam   . Duloxetine     REACTION: sz like activity  . Ibuprofen     REACTION: GI bleed  . Oxycodone-Acetaminophen     REACTION: unspecified  . Pregabalin     REACTION: kept her up and confusion  . Propoxyphene Hcl   . Risperidone     REACTION: confusion  . Sulfonamide Derivatives     REACTION: unspecified  . Tramadol Hcl     REACTION: doesn't remember what happened but couldn't tolerate  . Venlafaxine     REACTION: unspecified  . Methocarbamol Rash    Family History  Problem Relation Age of Onset  . Heart disease Mother        AMI  . Stroke Mother   . Heart disease Father   . Hypertension Sister   . Breast cancer Sister   . Hypertension Sister   . Heart disease Sister        MI  . Diabetes Sister   . Kidney cancer Neg Hx   . Kidney disease Neg Hx   . Prostate cancer Neg Hx     Social History Social History   Tobacco Use  . Smoking status: Former Smoker    Packs/day: 1.00    Years: 40.00    Pack years: 40.00    Types: Cigarettes    Last attempt to quit: 09/25/2013    Years since quitting: 4.0  . Smokeless tobacco: Never Used  . Tobacco comment:    Substance Use Topics  . Alcohol use: Yes    Alcohol/week: 0.0 oz    Comment: rarely  . Drug use: No    Review of Systems Constitutional: Negative for fever. Cardiovascular: Intermittent chest pain x2 weeks.  Awoke her from her sleep this morning. Respiratory: Negative for shortness of breath.  Negative for cough Gastrointestinal: Negative for abdominal pain, vomiting  Genitourinary: Negative for urinary compaints Musculoskeletal: Negative for leg pain or swelling Skin: Negative for skin complaints  Neurological: Negative for  headache All other ROS negative  ____________________________________________   PHYSICAL EXAM:  VITAL SIGNS: ED Triage Vitals  Enc Vitals Group     BP 10/05/17 1112 (!) 123/52     Pulse Rate 10/05/17 1112 (!) 58     Resp 10/05/17 1112 16     Temp 10/05/17 1112 97.7 F (36.5 C)     Temp Source 10/05/17 1112 Oral     SpO2 10/05/17 1112 100 %     Weight 10/05/17 1110 117 lb (53.1 kg)     Height 10/05/17 1110 5\' 2"  (1.575 m)     Head Circumference --      Peak Flow --      Pain Score 10/05/17  1110 5     Pain Loc --      Pain Edu? --      Excl. in [place]? --    Constitutional: Alert and oriented. Well appearing and in no distress. Eyes: Normal exam ENT   Head: Normocephalic and atraumatic.   Mouth/Throat: Mucous membranes are moist. Cardiovascular: Normal rate, regular rhythm. No murmur.  Chest wall is nontender. Respiratory: Normal respiratory effort without tachypnea nor retractions. Breath sounds are clear  Gastrointestinal: Soft and nontender. No distention.  Musculoskeletal: Nontender with normal range of motion in all extremities. No lower extremity tenderness or edema. Neurologic:  Normal speech and language. No gross focal neurologic deficits  Skin:  Skin is warm, dry and intact.  Psychiatric: Mood and affect are normal.   ____________________________________________    EKG  EKG reviewed and interpreted by myself shows normal sinus rhythm at 63 bpm with a narrow QRS, normal axis, normal intervals, no ST changes.  Normal EKG.  ____________________________________________    RADIOLOGY  Chest x-ray negative  ____________________________________________   INITIAL IMPRESSION / ASSESSMENT AND PLAN / ED COURSE  Pertinent labs & imaging results that were available during my care of the patient were reviewed by me and considered in my medical decision making (see chart for details).  Patient presents to the emergency department for intermittent chest pain  over the past 2 weeks.  Describes it as sharp pain in the front of her chest radiating through to the back of her chest.  States it awoke her from her sleep this morning.  States occasional nausea with the pain, denies any shortness of breath or diaphoresis.  Overall the patient appears well, no distress denies any complaints at this time.  Differential would include ACS, PE, dissection, chest wall pain, esophageal spasm, pneumonia, pneumothorax.  Overall the patient appears very well, labs are within normal limits including a negative troponin.  Chest x-ray is normal.  EKG is reassuring.  Patient has no chest pain at this time.  However given the description of chest pain radiating to her back will obtain CT angiography of the chest for further evaluation.  Patient agreeable to this plan of care.  If the CT scan is negative I anticipate likely discharge home with cardiology follow-up for stress test.  Patient states she has had a stress test previously but it has been over 10 years ago.   CT scan is negative.  We will discharge patient home with cardiology follow-up.  ____________________________________________   FINAL CLINICAL IMPRESSION(S) / ED DIAGNOSES  Chest pain    Harvest Dark, MD 10/05/17 1406

## 2017-10-05 NOTE — Telephone Encounter (Signed)
Copied from [place] [id number]. Topic: Quick Communication - See Telephone Encounter >> Oct 05, 2017  3:14 PM Mylinda Latina, NT wrote: CRM for notification. See Telephone encounter for: 10/05/17. Patient called and states the she needs a refill of her omeprazole (PRILOSEC) 20 MG capsule  [id number] [name], [place] (Phone) [phone] (Fax)

## 2017-10-05 NOTE — Addendum Note (Signed)
Addended by: Helene Shoe on: 10/05/2017 04:31 PM   Modules accepted: Orders

## 2017-10-05 NOTE — Telephone Encounter (Signed)
Pt needing to speak with someone regarding not feeling well. Pt has been in the bed and not able to do much. She said her chest area is sore and hurts.  She had an episode last night and layed still until it passed since it was so late.     Patient is calling to report her symptoms- very suspicious of cardiac event- advised 911/ED. Patient agrees to go- discouraged driving herself- will call nephew- 911 if nephew not available   Reason for Disposition . [1] Chest pain lasts > 5 minutes AND [2] age > 64  Answer Assessment - Initial Assessment Questions 1. LOCATION: "Where does it hurt?"       In center of chest in heart area 2. RADIATION: "Does the pain go anywhere else?" (e.g., into neck, jaw, arms, back)     Neck, L arm to fingers, lower back 3. ONSET: "When did the chest pain begin?" (Minutes, hours or days)      Off/on for 2 weeks- bad last night 4. PATTERN "Does the pain come and go, or has it been constant since it started?"  "Does it get worse with exertion?"      Comes and goes- still hurting now. Not sure- moving very little 5. DURATION: "How long does it last" (e.g., seconds, minutes, hours)     2 am- 8 am 6. SEVERITY: "How bad is the pain?"  (e.g., Scale 1-10; mild, moderate, or severe)    - MILD (1-3): doesn't interfere with normal activities     - MODERATE (4-7): interferes with normal activities or awakens from sleep    - SEVERE (8-10): excruciating pain, unable to do any normal activities       Mild now 7. CARDIAC RISK FACTORS: "Do you have any history of heart problems or risk factors for heart disease?" (e.g., prior heart attack, angina; high blood pressure, diabetes, being overweight, high cholesterol, smoking, or strong family history of heart disease)     Family history 8. PULMONARY RISK FACTORS: "Do you have any history of lung disease?"  (e.g., blood clots in lung, asthma, emphysema, birth control pills)     no 9. CAUSE: "What do you think is causing the chest  pain?"     Possible heart event 10. OTHER SYMPTOMS: "Do you have any other symptoms?" (e.g., dizziness, nausea, vomiting, sweating, fever, difficulty breathing, cough)       Fatigue, dizziness, spinning feeling in back of head, nausea 11. PREGNANCY: "Is there any chance you are pregnant?" "When was your last menstrual period?"       n/a  Protocols used: CHEST PAIN-A-AH

## 2017-10-05 NOTE — Telephone Encounter (Signed)
prilosec refill Last Refill:05/06/16 # 180 Last OV: 11/29/17 with [facility] PCP:  Tushka Desanctis [phone]

## 2017-10-05 NOTE — Telephone Encounter (Addendum)
Pt last seen 03/20/17 for annual; refill per protocol. [facility]. Unable to reach pt by phone to let her know med sent to pharmacy.

## 2017-10-09 ENCOUNTER — Telehealth: Payer: Self-pay

## 2017-10-09 NOTE — Telephone Encounter (Signed)
Spoke to pt. Made an appt for 10-23-17

## 2017-10-09 NOTE — Telephone Encounter (Signed)
Spoke to pt. She said the chest pain is better.  She is still having issues with lack of energy. She just wants to lay around and do nothing. She said she called her hematologist and they told her that she has an appt in October.

## 2017-10-09 NOTE — Telephone Encounter (Signed)
She is not anemic as of the recent labs---so doubt hematologist will help. Probably needs appt with me----see if I have something next week or later this week

## 2017-10-23 ENCOUNTER — Encounter: Payer: Self-pay | Admitting: Internal Medicine

## 2017-10-23 ENCOUNTER — Ambulatory Visit (INDEPENDENT_AMBULATORY_CARE_PROVIDER_SITE_OTHER): Payer: Medicare HMO | Admitting: Internal Medicine

## 2017-10-23 VITALS — BP 118/70 | HR 70 | Temp 97.7°F | Ht 62.25 in | Wt 120.0 lb

## 2017-10-23 DIAGNOSIS — M797 Fibromyalgia: Secondary | ICD-10-CM | POA: Diagnosis not present

## 2017-10-23 MED ORDER — IMIPRAMINE HCL 25 MG PO TABS: 25.0000 mg | ORAL_TABLET | Freq: Every day | ORAL | 3 refills | Status: DC

## 2017-10-23 NOTE — Assessment & Plan Note (Signed)
Reviewed past medications for fibromyalgia Hard to tell which did any good--but she has been better in past Will try the imipramine again---25 at first (10-20 didn't help in past) and increase prn Also cyclobenzaprine in past---would consider this as well She didn't think the nortriptyline helped

## 2017-10-23 NOTE — Progress Notes (Signed)
Subjective:    Patient ID: Lindsey Ward, female    DOB: Oct 17, 1950, 67 y.o.   MRN: [id number]  HPI Here due to ongoing fatigue Also, "my body hurts" Spending all day in bed or on the couch  Not sleeping well--often only 3-4 hours a night Seems to be her same fibromyalgia  3 recent surgeries---retinal detachment "that put me way down" Cataracts OU  Didn't like gabapentin---made her gain too much weight (and wasn't that helpful)  Current Outpatient Medications on File Prior to Visit  Medication Sig Dispense Refill  . ALPRAZolam (XANAX) 1 MG tablet Take 1 tablet (1 mg total) by mouth 3 (three) times daily as needed. 270 tablet 0  . cetirizine (ZYRTEC) 10 MG tablet TAKE 1 TABLET BY MOUTH EVERY DAY 30 tablet 11  . HYDROcodone-acetaminophen (NORCO/VICODIN) 5-325 MG tablet Take 1 tablet by mouth 3 (three) times daily as needed. 90 tablet 0  . omeprazole (PRILOSEC) 20 MG capsule Take 1 capsule (20 mg total) by mouth 2 (two) times daily. 180 capsule 1  . promethazine (PHENERGAN) 12.5 MG tablet Take 1 tablet (12.5 mg total) by mouth every 8 (eight) hours as needed for nausea or vomiting. 20 tablet 0  . SUMAtriptan (IMITREX) 50 MG tablet TAKE 0.5-1 TABLET BY MOUTH DAILY AS NEEDED FOR MIGRAINE. 10 tablet 11   No current facility-administered medications on file prior to visit.     Allergies  Allergen Reactions  . Tizanidine Other (See Comments)    "throws me in a different world"  . Aspirin     REACTION: GI bleed  . Buspirone Hcl     REACTION: unspecified  . Ciprofloxacin     REACTION: unspecified  . Codeine   . Diazepam   . Duloxetine     REACTION: sz like activity  . Ibuprofen     REACTION: GI bleed  . Oxycodone-Acetaminophen     REACTION: unspecified  . Pregabalin     REACTION: kept her up and confusion  . Propoxyphene Hcl   . Risperidone     REACTION: confusion  . Sulfonamide Derivatives     REACTION: unspecified  . Tramadol Hcl     REACTION: doesn't remember what  happened but couldn't tolerate  . Venlafaxine     REACTION: unspecified  . Methocarbamol Rash    Past Medical History:  Diagnosis Date  . Bowel obstruction ([place])   . Chronic fatigue   . Depression   . Fibromyalgia   . GERD (gastroesophageal reflux disease)   . Hyperlipemia   . Migraine   . Obstipation   . Osteopenia   . PUD (peptic ulcer disease)     Past Surgical History:  Procedure Laterality Date  . ABDOMINAL SURGERY    . APPENDECTOMY  2005  . BREAST BIOPSY Bilateral 1999   rt w/out clip - neg, lt w/clip - neg  . BREAST CYST ASPIRATION Right 2003   neg  . CHOLECYSTECTOMY  2004  . COLONOSCOPY WITH PROPOFOL N/A 06/09/2016   Procedure: COLONOSCOPY WITH PROPOFOL;  Surgeon: Jonathon Bellows, MD;  Location: [facility] ENDOSCOPY;  Service: Endoscopy;  Laterality: N/A;  . ESOPHAGOGASTRODUODENOSCOPY (EGD) WITH PROPOFOL N/A 06/09/2016   Procedure: ESOPHAGOGASTRODUODENOSCOPY (EGD) WITH PROPOFOL;  Surgeon: Jonathon Bellows, MD;  Location: [facility] ENDOSCOPY;  Service: Endoscopy;  Laterality: N/A;  . EYE SURGERY    . GIVENS CAPSULE STUDY N/A 06/23/2016   Procedure: GIVENS CAPSULE STUDY;  Surgeon: Jonathon Bellows, MD;  Location: [facility] ENDOSCOPY;  Service: Endoscopy;  Laterality: N/A;  .  PARS PLANA VITRECTOMY W/ REPAIR OF MACULAR HOLE Left 01/2017   [facility]  . TOTAL ABDOMINAL HYSTERECTOMY W/ BILATERAL SALPINGOOPHORECTOMY  1986    Family History  Problem Relation Age of Onset  . Heart disease Mother        AMI  . Stroke Mother   . Heart disease Father   . Hypertension Sister   . Breast cancer Sister   . Hypertension Sister   . Heart disease Sister        MI  . Diabetes Sister   . Kidney cancer Neg Hx   . Kidney disease Neg Hx   . Prostate cancer Neg Hx     Social History   Socioeconomic History  . Marital status: Single    Spouse name: Not on file  . Number of children: 1  . Years of education: Not on file  . Highest education level: Not on file  Occupational History  . Occupation: DISABLED    Social Needs  . Financial resource strain: Not on file  . Food insecurity:    Worry: Not on file    Inability: Not on file  . Transportation needs:    Medical: Not on file    Non-medical: Not on file  Tobacco Use  . Smoking status: Former Smoker    Packs/day: 1.00    Years: 40.00    Pack years: 40.00    Types: Cigarettes    Last attempt to quit: 09/25/2013    Years since quitting: 4.0  . Smokeless tobacco: Never Used  . Tobacco comment:    Substance and Sexual Activity  . Alcohol use: Yes    Alcohol/week: 0.0 standard drinks    Comment: rarely  . Drug use: No  . Sexual activity: Not on file  Lifestyle  . Physical activity:    Days per week: Not on file    Minutes per session: Not on file  . Stress: Not on file  Relationships  . Social connections:    Talks on phone: Not on file    Gets together: Not on file    Attends religious service: Not on file    Active member of club or organization: Not on file    Attends meetings of clubs or organizations: Not on file    Relationship status: Not on file  . Intimate partner violence:    Fear of current or ex partner: Not on file    Emotionally abused: Not on file    Physically abused: Not on file    Forced sexual activity: Not on file  Other Topics Concern  . Not on file  Social History Narrative   Living alone again      No living will   Daughter should make health care decisions   Requests DNR---done 10/24/14   No tube feeds if cognitively unaware   Highest level of education: 9th   Review of Systems Appetite okay Weight up slightly    Objective:   Physical Exam  Constitutional: She appears well-developed. No distress.  Psychiatric:  No overt depression now Normal appearance and speech           Assessment & Plan:

## 2017-10-23 NOTE — Patient Instructions (Signed)
Start the imipramine 25mg  nightly at bedtime. If no problems, and still not sleeping well after 1 week, increase to 2 capsules at night (50mg ). If you don't tolerate it, let me know so we can try something else.

## 2017-10-24 ENCOUNTER — Telehealth: Payer: Self-pay | Admitting: Internal Medicine

## 2017-10-24 NOTE — Telephone Encounter (Signed)
Copied from [place] [id number]. Topic: Quick Communication - Rx Refill/Question >> Oct 24, 2017 10:55 AM Burchel, Abbi R wrote: Medication: imipramine (TOFRANIL) 25 MG tablet  Pt states this rx requires a PA.  Please fax to: [phone]

## 2017-10-24 NOTE — Telephone Encounter (Signed)
PA started on CoverMyMeds. Can take up to 72 hours for a response

## 2017-10-25 NOTE — Telephone Encounter (Signed)
Approved to 10-25-19

## 2017-10-31 ENCOUNTER — Ambulatory Visit: Payer: Self-pay | Admitting: *Deleted

## 2017-10-31 NOTE — Telephone Encounter (Signed)
Pt seen by Dr. Silvio Pate 10/23/17, started on Imipramine 25mg . States she took for 3 nights, ineffective. States instructed to increase dosage to 2 tablets. Has taken 50mg  for 3 nights. Reports "Buzzing in ears",constant, onset after first night of increased dosage. Also reports loss of appetite. Per note Dr. Silvio Pate may try alternative med.  Please advise: [phone]  Or  [phone] Reason for Disposition . Caller has NON-URGENT medication question about med that PCP prescribed and triager unable to answer question  Answer Assessment - Initial Assessment Questions 1. SYMPTOMS: "Do you have any symptoms?"     Yes . "Buzzing in ears, loss of appetite" 2. SEVERITY: If symptoms are present, ask "Are they mild, moderate or severe?"     Moderate  Protocols used: MEDICATION QUESTION CALL-A-AH

## 2017-10-31 NOTE — Telephone Encounter (Signed)
Spoke to pt. She will let us know if she continues to have issues.

## 2017-10-31 NOTE — Telephone Encounter (Signed)
She may want to give it a few days before we switch--there is no perfect drug, and some of the mild side effects will wear off after a few days (or nights)

## 2017-11-01 IMAGING — CR DG ABDOMEN 1V
2 series · 2 of 2 positions shown · non-contrast
Comparison: 11/26/2010 abdominal radiographs.

CLINICAL DATA: Iron deficiency anemia.  Capsule study.

EXAM:
ABDOMEN - 1 VIEW

[abdomen kub (1 of 2)]
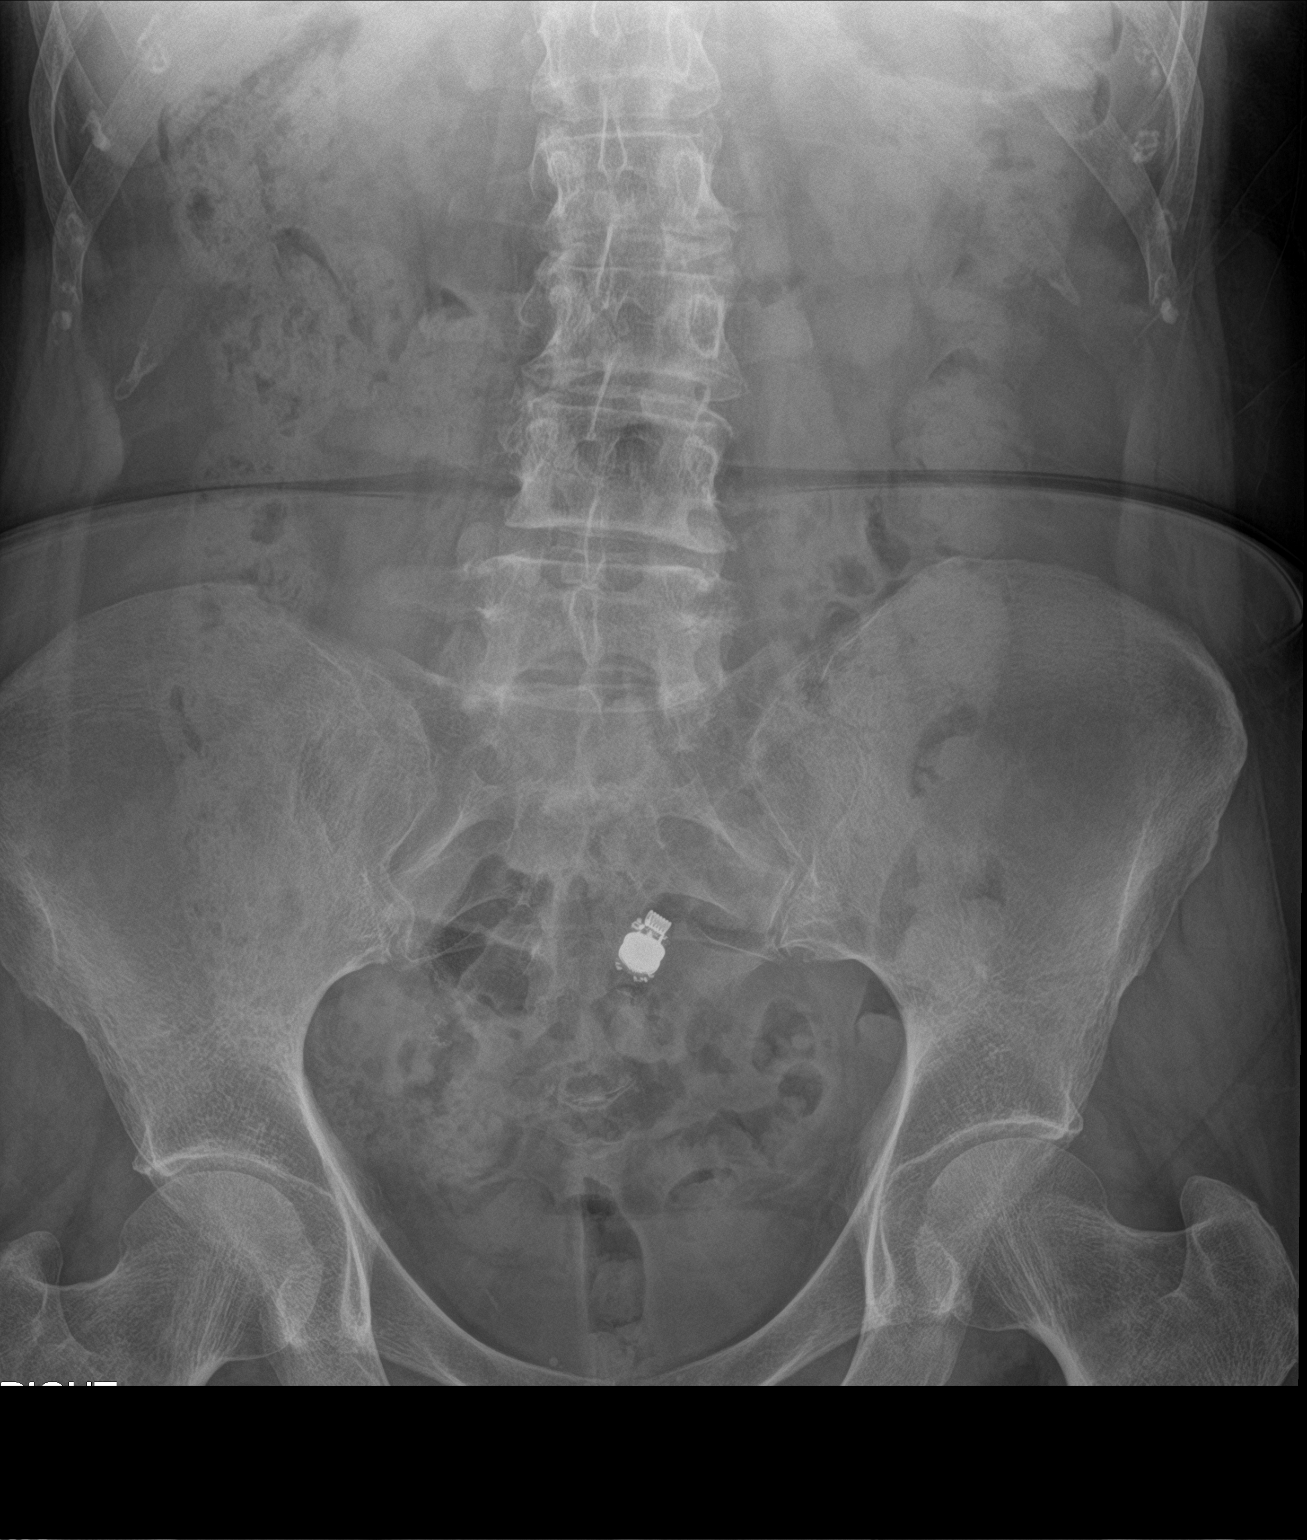

[abdomen kub (2 of 2)]
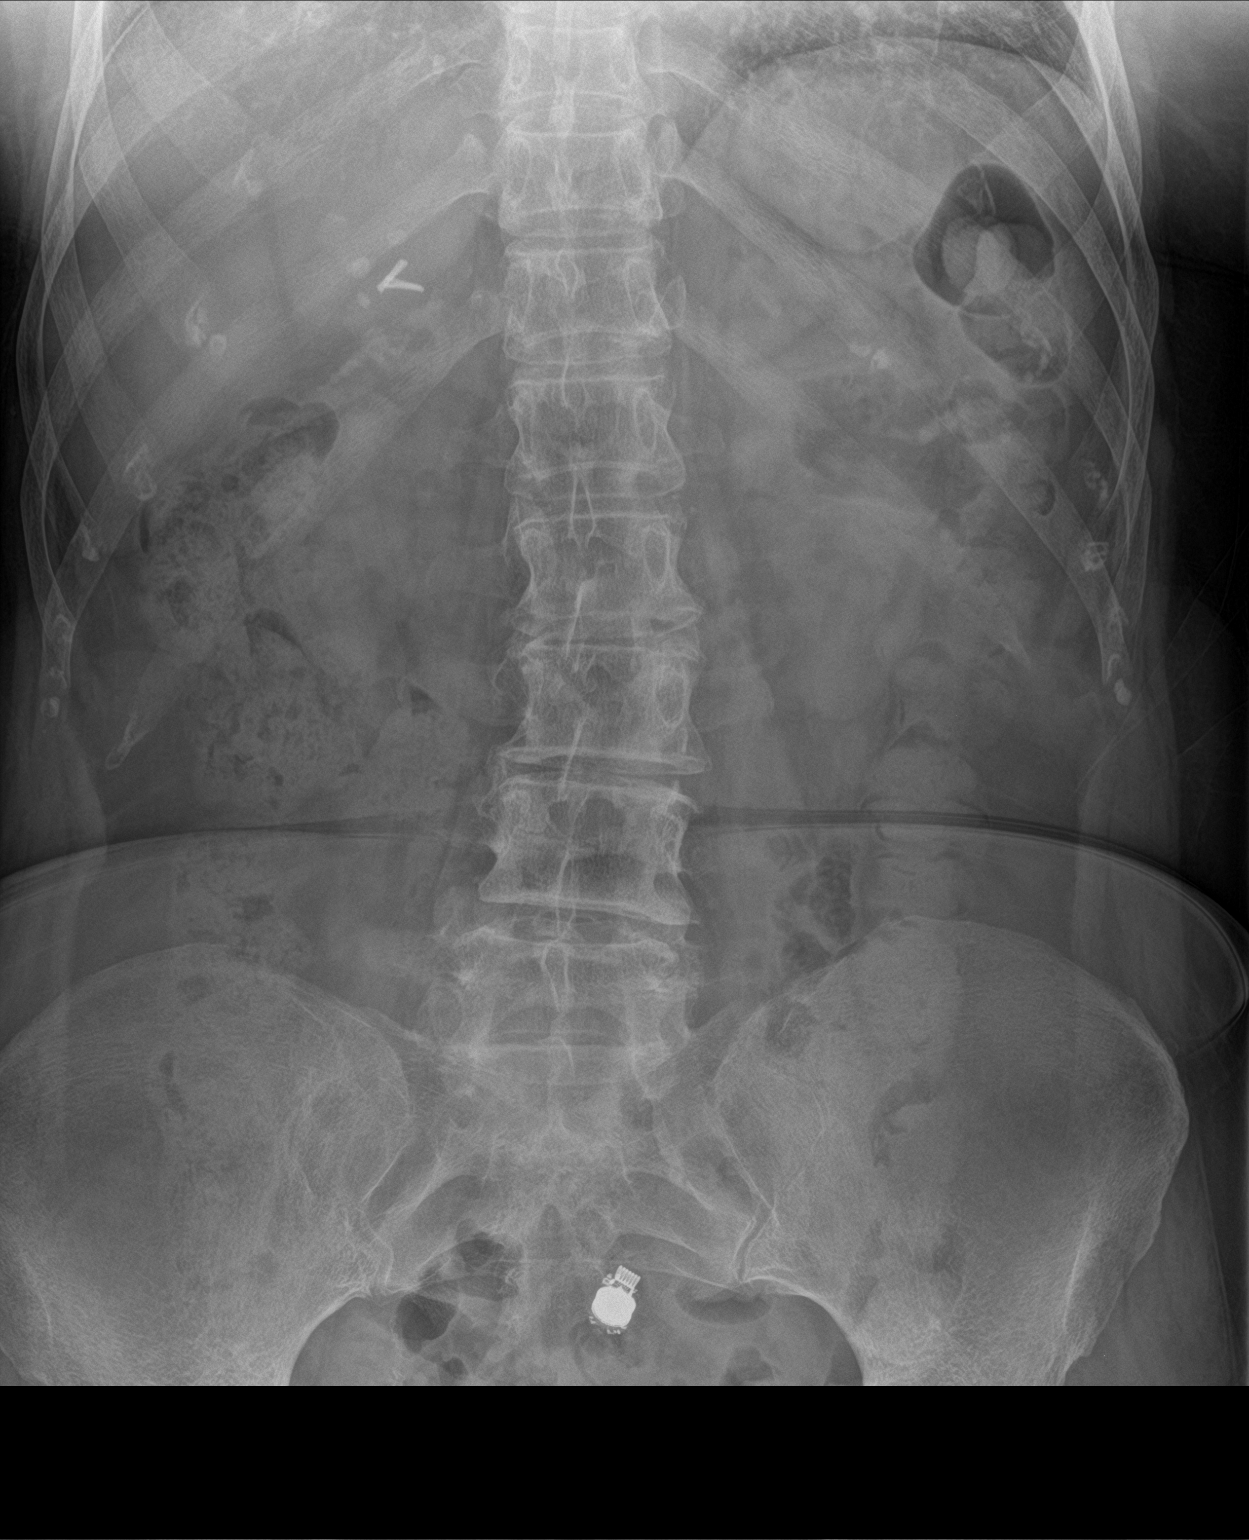

[2 of 2 positions shown; findings below may reference images not displayed]

FINDINGS: Capsule overlies the left sacrum. No dilated small bowel loops.
Mild-to-moderate colorectal stool volume. No evidence of pneumatosis
or pneumoperitoneum. Cholecystectomy clips are seen in the right
upper quadrant of the abdomen.
IMPRESSION: Capsule overlies the left sacrum, and may be present within pelvic
small bowel loops or sigmoid colon.

Nonobstructive bowel gas pattern.

## 2017-11-08 ENCOUNTER — Other Ambulatory Visit: Payer: Self-pay | Admitting: *Deleted

## 2017-11-08 DIAGNOSIS — D509 Iron deficiency anemia, unspecified: Secondary | ICD-10-CM

## 2017-11-09 ENCOUNTER — Inpatient Hospital Stay: Payer: Medicare HMO | Attending: Oncology

## 2017-11-09 DIAGNOSIS — D509 Iron deficiency anemia, unspecified: Secondary | ICD-10-CM | POA: Diagnosis not present

## 2017-11-09 LAB — CBC WITH DIFFERENTIAL/PLATELET
Basophils Absolute: 0.1 10*3/uL (ref 0–0.1)
Basophils Relative: 1 %
Eosinophils Absolute: 0.2 10*3/uL (ref 0–0.7)
Eosinophils Relative: 3 %
HCT: 36.3 % (ref 35.0–47.0)
Hemoglobin: 12.5 g/dL (ref 12.0–16.0)
Lymphocytes Relative: 21 %
Lymphs Abs: 1.3 10*3/uL (ref 1.0–3.6)
MCH: 32.1 pg (ref 26.0–34.0)
MCHC: 34.5 g/dL (ref 32.0–36.0)
MCV: 92.9 fL (ref 80.0–100.0)
Monocytes Absolute: 0.6 10*3/uL (ref 0.2–0.9)
Monocytes Relative: 9 %
Neutro Abs: 4.1 10*3/uL (ref 1.4–6.5)
Neutrophils Relative %: 66 %
Platelets: 293 10*3/uL (ref 150–440)
RBC: 3.91 MIL/uL (ref 3.80–5.20)
RDW: 12.1 % (ref 11.5–14.5)
WBC: 6.1 10*3/uL (ref 3.6–11.0)

## 2017-11-09 LAB — IRON AND TIBC
Iron: 86 ug/dL (ref 28–170)
Saturation Ratios: 27 % (ref 10.4–31.8)
TIBC: 317 ug/dL (ref 250–450)
UIBC: 231 ug/dL

## 2017-11-09 LAB — FERRITIN: Ferritin: 29 ng/mL (ref 11–307)

## 2017-11-10 ENCOUNTER — Telehealth: Payer: Self-pay | Admitting: *Deleted

## 2017-11-10 NOTE — Telephone Encounter (Signed)
Per VO Dr Grayland Ormond, no iron needed at this time, keep appointment as scheduled. Patient informed of this

## 2017-11-10 NOTE — Telephone Encounter (Signed)
Patient called asking about her results from yesterday and asking if she needs iron inf. Please advise

## 2017-11-15 ENCOUNTER — Other Ambulatory Visit: Payer: Self-pay | Admitting: Internal Medicine

## 2017-11-20 DIAGNOSIS — H35342 Macular cyst, hole, or pseudohole, left eye: Secondary | ICD-10-CM | POA: Diagnosis not present

## 2017-11-20 DIAGNOSIS — H04123 Dry eye syndrome of bilateral lacrimal glands: Secondary | ICD-10-CM | POA: Diagnosis not present

## 2017-11-24 ENCOUNTER — Telehealth: Payer: Self-pay | Admitting: Internal Medicine

## 2017-11-24 DIAGNOSIS — R928 Other abnormal and inconclusive findings on diagnostic imaging of breast: Secondary | ICD-10-CM

## 2017-11-24 NOTE — Telephone Encounter (Signed)
Please put in the required orders--I don't know why they won't do that and set them up for me to sign

## 2017-11-24 NOTE — Telephone Encounter (Signed)
Lindsey Ward is requesting orders for 6 mo repeat mammo.  Copied from [place] [phone]. Topic: Referral - Request >> Nov 24, 2017  8:50 AM Scherrie Gerlach wrote: Reason for CRM: pt got a call it is time to repeat her 6 mo mammogram for Oct. (per mammogram 06/30/17) Pt has appt on 10/16 . [facility] advised pt needs a referral from the dr in order to keep that appt. Please advise!

## 2017-11-27 NOTE — Telephone Encounter (Signed)
Order placed and advised pt. She will call tomorrow to schedule.

## 2017-11-28 ENCOUNTER — Telehealth: Payer: Self-pay | Admitting: Internal Medicine

## 2017-11-28 NOTE — Telephone Encounter (Signed)
Order placed

## 2017-11-28 NOTE — Telephone Encounter (Signed)
Please add [id number] ultra sound per [facility] Thanks

## 2017-11-28 NOTE — Addendum Note (Signed)
Addended by: Pilar Grammes on: 11/28/2017 11:54 AM   Modules accepted: Orders

## 2017-11-28 NOTE — Telephone Encounter (Signed)
Open in error

## 2017-12-04 DIAGNOSIS — H35371 Puckering of macula, right eye: Secondary | ICD-10-CM | POA: Diagnosis not present

## 2017-12-05 ENCOUNTER — Ambulatory Visit: Payer: Medicare HMO | Admitting: Internal Medicine

## 2017-12-05 ENCOUNTER — Ambulatory Visit (INDEPENDENT_AMBULATORY_CARE_PROVIDER_SITE_OTHER): Payer: Medicare HMO | Admitting: Internal Medicine

## 2017-12-05 ENCOUNTER — Encounter: Payer: Self-pay | Admitting: Internal Medicine

## 2017-12-05 VITALS — BP 108/66 | HR 70 | Temp 97.8°F | Ht 62.0 in | Wt 116.0 lb

## 2017-12-05 DIAGNOSIS — M797 Fibromyalgia: Secondary | ICD-10-CM

## 2017-12-05 DIAGNOSIS — Z23 Encounter for immunization: Secondary | ICD-10-CM | POA: Diagnosis not present

## 2017-12-05 MED ORDER — CITALOPRAM HYDROBROMIDE 10 MG PO TABS: 10.0000 mg | ORAL_TABLET | Freq: Every day | ORAL | 3 refills | Status: DC

## 2017-12-05 MED ORDER — PROMETHAZINE HCL 12.5 MG PO TABS: 12.5000 mg | ORAL_TABLET | Freq: Three times a day (TID) | ORAL | 0 refills | Status: DC | PRN

## 2017-12-05 MED ORDER — SUMATRIPTAN SUCCINATE 50 MG PO TABS: ORAL_TABLET | ORAL | 11 refills | Status: DC

## 2017-12-05 NOTE — Assessment & Plan Note (Signed)
Associated with chronic anxiety and dysthymia Imipramine some help for sleep ---but no improved function Will try adding low dose citalopram 10mg  She will continue the imipramine for now--but stop if the ringing gets too troublesome

## 2017-12-05 NOTE — Progress Notes (Signed)
Subjective:    Patient ID: Lindsey Ward, female    DOB: [birth year], 67 y.o.   MRN: [id number]  HPI Here for follow up of the fibromyalgia  Has been taking 1 imipramine (tried 2 after the first night) Sleeping better but awakens with ringing in her ears(which can last till the afternoon). No hearing problems Still spending a lot of time on the couch "I just can't motivate"--though does still have pain  Ongoing chronic depression as well No suicidal thoughts or preoccupation with dying  Current Outpatient Medications on File Prior to Visit  Medication Sig Dispense Refill  . ALPRAZolam (XANAX) 1 MG tablet Take 1 tablet (1 mg total) by mouth 3 (three) times daily as needed. 270 tablet 0  . cetirizine (ZYRTEC) 10 MG tablet TAKE 1 TABLET BY MOUTH EVERY DAY 30 tablet 11  . HYDROcodone-acetaminophen (NORCO/VICODIN) 5-325 MG tablet Take 1 tablet by mouth 3 (three) times daily as needed. 90 tablet 0  . imipramine (TOFRANIL) 25 MG tablet Take 1-2 tablets (25-50 mg total) by mouth at bedtime. 60 tablet 3  . omeprazole (PRILOSEC) 20 MG capsule Take 1 capsule (20 mg total) by mouth 2 (two) times daily. 180 capsule 1  . promethazine (PHENERGAN) 12.5 MG tablet Take 1 tablet (12.5 mg total) by mouth every 8 (eight) hours as needed for nausea or vomiting. 20 tablet 0  . SUMAtriptan (IMITREX) 50 MG tablet TAKE 0.5-1 TABLET BY MOUTH DAILY AS NEEDED FOR MIGRAINE. 10 tablet 11   No current facility-administered medications on file prior to visit.     Allergies  Allergen Reactions  . Tizanidine Other (See Comments)    "throws me in a different world"  . Aspirin     REACTION: GI bleed  . Buspirone Hcl     REACTION: unspecified  . Ciprofloxacin     REACTION: unspecified  . Codeine   . Diazepam   . Duloxetine     REACTION: sz like activity  . Ibuprofen     REACTION: GI bleed  . Oxycodone-Acetaminophen     REACTION: unspecified  . Pregabalin     REACTION: kept her up and confusion  .  Propoxyphene Hcl   . Risperidone     REACTION: confusion  . Sulfonamide Derivatives     REACTION: unspecified  . Tramadol Hcl     REACTION: doesn't remember what happened but couldn't tolerate  . Venlafaxine     REACTION: unspecified  . Methocarbamol Rash    Past Medical History:  Diagnosis Date  . Bowel obstruction ([place])   . Chronic fatigue   . Depression   . Fibromyalgia   . GERD (gastroesophageal reflux disease)   . Hyperlipemia   . Migraine   . Obstipation   . Osteopenia   . PUD (peptic ulcer disease)     Past Surgical History:  Procedure Laterality Date  . ABDOMINAL SURGERY    . APPENDECTOMY  2005  . BREAST BIOPSY Bilateral 1999   rt w/out clip - neg, lt w/clip - neg  . BREAST CYST ASPIRATION Right 2003   neg  . CHOLECYSTECTOMY  2004  . COLONOSCOPY WITH PROPOFOL N/A 06/09/2016   Procedure: COLONOSCOPY WITH PROPOFOL;  Surgeon: Jonathon Bellows, MD;  Location: [facility] ENDOSCOPY;  Service: Endoscopy;  Laterality: N/A;  . ESOPHAGOGASTRODUODENOSCOPY (EGD) WITH PROPOFOL N/A 06/09/2016   Procedure: ESOPHAGOGASTRODUODENOSCOPY (EGD) WITH PROPOFOL;  Surgeon: Jonathon Bellows, MD;  Location: [facility] ENDOSCOPY;  Service: Endoscopy;  Laterality: N/A;  . EYE SURGERY    .  GIVENS CAPSULE STUDY N/A 06/23/2016   Procedure: GIVENS CAPSULE STUDY;  Surgeon: Jonathon Bellows, MD;  Location: [facility] ENDOSCOPY;  Service: Endoscopy;  Laterality: N/A;  . PARS PLANA VITRECTOMY W/ REPAIR OF MACULAR HOLE Left 01/2017   [facility]  . TOTAL ABDOMINAL HYSTERECTOMY W/ BILATERAL SALPINGOOPHORECTOMY  1986    Family History  Problem Relation Age of Onset  . Heart disease Mother        AMI  . Stroke Mother   . Heart disease Father   . Hypertension Sister   . Breast cancer Sister   . Hypertension Sister   . Heart disease Sister        MI  . Diabetes Sister   . Kidney cancer Neg Hx   . Kidney disease Neg Hx   . Prostate cancer Neg Hx     Social History   Socioeconomic History  . Marital status: Single    Spouse name:  Not on file  . Number of children: 1  . Years of education: Not on file  . Highest education level: Not on file  Occupational History  . Occupation: DISABLED  Social Needs  . Financial resource strain: Not on file  . Food insecurity:    Worry: Not on file    Inability: Not on file  . Transportation needs:    Medical: Not on file    Non-medical: Not on file  Tobacco Use  . Smoking status: Former Smoker    Packs/day: 1.00    Years: 40.00    Pack years: 40.00    Types: Cigarettes    Last attempt to quit: 09/25/2013    Years since quitting: 4.1  . Smokeless tobacco: Never Used  . Tobacco comment:    Substance and Sexual Activity  . Alcohol use: Yes    Alcohol/week: 0.0 standard drinks    Comment: rarely  . Drug use: No  . Sexual activity: Not on file  Lifestyle  . Physical activity:    Days per week: Not on file    Minutes per session: Not on file  . Stress: Not on file  Relationships  . Social connections:    Talks on phone: Not on file    Gets together: Not on file    Attends religious service: Not on file    Active member of club or organization: Not on file    Attends meetings of clubs or organizations: Not on file    Relationship status: Not on file  . Intimate partner violence:    Fear of current or ex partner: Not on file    Emotionally abused: Not on file    Physically abused: Not on file    Forced sexual activity: Not on file  Other Topics Concern  . Not on file  Social History Narrative   Living alone again      No living will   Daughter should make health care decisions   Requests DNR---done 10/24/14   No tube feeds if cognitively unaware   Highest level of education: 9th   Review of Systems Appetite is still poor Has lost some more weight    Objective:   Physical Exam  Constitutional: She appears well-developed. No distress.  Psychiatric:  Normal speech and appearance No overt depression now Affect is normal           Assessment &  Plan:

## 2017-12-11 NOTE — Progress Notes (Signed)
[place]  Telephone:([phone] Fax:(336) [phone]  ID: Lindsey Ward OB: 1950/08/15  MR#: [id number]  HER#:[id number]  Patient Care Team: Venia Carbon, MD as PCP - General  CHIEF COMPLAINT: Iron deficiency anemia  INTERVAL HISTORY: Patient last evaluated in clinic in March 2018.  She returns to clinic today for repeat laboratory work and further evaluation.  She has chronic fatigue which she attributes to her fibromyalgia, but otherwise feels well. She has no neurologic complaints. She denies any recent fevers or illnesses. She has a good appetite and denies weight loss. She has no chest pain or shortness of breath. She denies any nausea, vomiting, constipation, or diarrhea. She denies any melena or hematochezia. She has no urinary complaints.  Patient feels at her baseline offers no specific complaints today.  REVIEW OF SYSTEMS:   Review of Systems  Constitutional: Positive for malaise/fatigue. Negative for fever and weight loss.  Respiratory: Negative.  Negative for cough.   Cardiovascular: Negative.  Negative for chest pain and leg swelling.  Gastrointestinal: Negative.  Negative for abdominal pain, blood in stool and melena.  Genitourinary: Negative.  Negative for hematuria.  Musculoskeletal: Positive for myalgias.  Skin: Negative.  Negative for rash.  Neurological: Negative.  Negative for focal weakness, weakness and headaches.  Psychiatric/Behavioral: Negative.  The patient is not nervous/anxious.     As per HPI. Otherwise, a complete review of systems is negative.  PAST MEDICAL HISTORY: Past Medical History:  Diagnosis Date  . Bowel obstruction ([place])   . Chronic fatigue   . Depression   . Fibromyalgia   . GERD (gastroesophageal reflux disease)   . Hyperlipemia   . Migraine   . Obstipation   . Osteopenia   . PUD (peptic ulcer disease)     PAST SURGICAL HISTORY: Past Surgical History:  Procedure Laterality Date  . ABDOMINAL SURGERY      . APPENDECTOMY  2005  . BREAST BIOPSY Bilateral 1999   rt w/out clip - neg, lt w/clip - neg  . BREAST CYST ASPIRATION Right 2003   neg  . CHOLECYSTECTOMY  2004  . COLONOSCOPY WITH PROPOFOL N/A 06/09/2016   Procedure: COLONOSCOPY WITH PROPOFOL;  Surgeon: Jonathon Bellows, MD;  Location: [facility] ENDOSCOPY;  Service: Endoscopy;  Laterality: N/A;  . ESOPHAGOGASTRODUODENOSCOPY (EGD) WITH PROPOFOL N/A 06/09/2016   Procedure: ESOPHAGOGASTRODUODENOSCOPY (EGD) WITH PROPOFOL;  Surgeon: Jonathon Bellows, MD;  Location: [facility] ENDOSCOPY;  Service: Endoscopy;  Laterality: N/A;  . EYE SURGERY    . GIVENS CAPSULE STUDY N/A 06/23/2016   Procedure: GIVENS CAPSULE STUDY;  Surgeon: Jonathon Bellows, MD;  Location: [facility] ENDOSCOPY;  Service: Endoscopy;  Laterality: N/A;  . PARS PLANA VITRECTOMY W/ REPAIR OF MACULAR HOLE Left 01/2017   [facility]  . TOTAL ABDOMINAL HYSTERECTOMY W/ BILATERAL SALPINGOOPHORECTOMY  1986    FAMILY HISTORY: Family History  Problem Relation Age of Onset  . Heart disease Mother        AMI  . Stroke Mother   . Heart disease Father   . Hypertension Sister   . Breast cancer Sister   . Hypertension Sister   . Heart disease Sister        MI  . Diabetes Sister   . Kidney cancer Neg Hx   . Kidney disease Neg Hx   . Prostate cancer Neg Hx     ADVANCED DIRECTIVES (Y/N):  N  HEALTH MAINTENANCE: Social History   Tobacco Use  . Smoking status: Former Smoker    Packs/day: 1.00  Years: 40.00    Pack years: 40.00    Types: Cigarettes    Last attempt to quit: 09/25/2013    Years since quitting: 4.2  . Smokeless tobacco: Never Used  . Tobacco comment:    Substance Use Topics  . Alcohol use: Yes    Alcohol/week: 0.0 standard drinks    Comment: rarely  . Drug use: No     Colonoscopy:  PAP:  Bone density:  Lipid panel:  Allergies  Allergen Reactions  . Tizanidine Other (See Comments)    "throws me in a different world"  . Aspirin     REACTION: GI bleed  . Buspirone Hcl     REACTION:  unspecified  . Ciprofloxacin     REACTION: unspecified  . Codeine   . Diazepam   . Duloxetine     REACTION: sz like activity  . Ibuprofen     REACTION: GI bleed  . Oxycodone-Acetaminophen     REACTION: unspecified  . Pregabalin     REACTION: kept her up and confusion  . Propoxyphene Hcl   . Risperidone     REACTION: confusion  . Sulfonamide Derivatives     REACTION: unspecified  . Tramadol Hcl     REACTION: doesn't remember what happened but couldn't tolerate  . Tramadol Hcl Other (See Comments)    REACTION: doesn't remember what happened but couldn't tolerate REACTION: doesn't remember what happened but couldn't tolerate REACTION: doesn't remember what happened but couldn't tolerate   . Venlafaxine     REACTION: unspecified  . Methocarbamol Rash    Current Outpatient Medications  Medication Sig Dispense Refill  . citalopram (CELEXA) 10 MG tablet Take 1 tablet (10 mg total) by mouth daily. 90 tablet 3  . imipramine (TOFRANIL) 25 MG tablet Take 1-2 tablets (25-50 mg total) by mouth at bedtime. 60 tablet 3  . promethazine (PHENERGAN) 12.5 MG tablet Take 1 tablet (12.5 mg total) by mouth every 8 (eight) hours as needed for nausea or vomiting. 20 tablet 0  . ALPRAZolam (XANAX) 1 MG tablet Take 1 tablet (1 mg total) by mouth 3 (three) times daily as needed. (Patient not taking: Reported on 12/14/2017) 270 tablet 0  . cetirizine (ZYRTEC) 10 MG tablet TAKE 1 TABLET BY MOUTH EVERY DAY (Patient not taking: Reported on 12/14/2017) 30 tablet 11  . HYDROcodone-acetaminophen (NORCO/VICODIN) 5-325 MG tablet Take 1 tablet by mouth 3 (three) times daily as needed. (Patient not taking: Reported on 12/14/2017) 90 tablet 0  . omeprazole (PRILOSEC) 20 MG capsule Take 1 capsule (20 mg total) by mouth 2 (two) times daily. (Patient not taking: Reported on 12/14/2017) 180 capsule 1  . SUMAtriptan (IMITREX) 50 MG tablet TAKE 0.5-1 TABLET BY MOUTH DAILY AS NEEDED FOR MIGRAINE. (Patient not taking: Reported  on 12/14/2017) 10 tablet 11   No current facility-administered medications for this visit.     OBJECTIVE: Vitals:   12/14/17 1013  BP: 113/66  Pulse: 73  Resp: 18  Temp: (!) 94.7 F (34.8 C)     Body mass index is 21.22 kg/m.    ECOG FS:0 - Asymptomatic  General: Well-developed, well-nourished, no acute distress. Eyes: Pink conjunctiva, anicteric sclera. HEENT: Normocephalic, moist mucous membranes. Lungs: Clear to auscultation bilaterally. Heart: Regular rate and rhythm. No rubs, murmurs, or gallops. Abdomen: Soft, nontender, nondistended. No organomegaly noted, normoactive bowel sounds. Musculoskeletal: No edema, cyanosis, or clubbing. Neuro: Alert, answering all questions appropriately. Cranial nerves grossly intact. Skin: No rashes or petechiae noted. Psych: Normal affect.  LAB RESULTS:  Lab Results  Component Value Date   NA 141 10/05/2017   K 3.5 10/05/2017   CL 107 10/05/2017   CO2 29 10/05/2017   GLUCOSE 103 (H) 10/05/2017   BUN 9 10/05/2017   CREATININE 0.68 10/05/2017   CALCIUM 9.1 10/05/2017   PROT 7.1 11/25/2016   ALBUMIN 4.2 11/25/2016   AST 33 11/25/2016   ALT 23 11/25/2016   ALKPHOS 89 11/25/2016   BILITOT 0.5 11/25/2016   GFRNONAA >60 10/05/2017   GFRAA >60 10/05/2017    Lab Results  Component Value Date   WBC 5.3 12/13/2017   NEUTROABS 3.2 12/13/2017   HGB 12.0 12/13/2017   HCT 34.9 (L) 12/13/2017   MCV 92.7 12/13/2017   PLT 289 12/13/2017   Lab Results  Component Value Date   IRON 67 12/13/2017   TIBC 333 12/13/2017   IRONPCTSAT 20 12/13/2017   Lab Results  Component Value Date   FERRITIN 20 12/13/2017     STUDIES: No results found.  ASSESSMENT: Iron deficiency anemia.  PLAN:  1. Iron deficiency anemia: Patient underwent colonoscopy and upper endoscopy on May 20, 2016 that did not reveal a distinct etiology.  Video endoscopy on July 05, 2017 revealed a possible site of bleeding and an ulceration in her small intestine.   Her hemoglobin and iron stores continue to be within normal limits.  She does not require additional treatment at this time.  Patient last received IV Feraheme on June 15, 2016.  Return to clinic in 6 months with repeat laboratory can further evaluation.  If patient's laboratory work remains within normal limits, she likely can be discharged from clinic at that time.    I spent a total of 20 minutes face-to-face with the patient of which greater than 50% of the visit was spent in counseling and coordination of care as detailed above.   Patient expressed understanding and was in agreement with this plan. She also understands that She can call clinic at any time with any questions, concerns, or complaints.    Lloyd Huger, MD   12/16/2017 8:55 AM

## 2017-12-13 ENCOUNTER — Other Ambulatory Visit: Payer: Self-pay | Admitting: *Deleted

## 2017-12-13 ENCOUNTER — Inpatient Hospital Stay: Payer: Medicare HMO | Attending: Oncology

## 2017-12-13 DIAGNOSIS — D649 Anemia, unspecified: Secondary | ICD-10-CM

## 2017-12-13 DIAGNOSIS — D509 Iron deficiency anemia, unspecified: Secondary | ICD-10-CM | POA: Diagnosis not present

## 2017-12-13 LAB — IRON AND TIBC
Iron: 67 ug/dL (ref 28–170)
Saturation Ratios: 20 % (ref 10.4–31.8)
TIBC: 333 ug/dL (ref 250–450)
UIBC: 266 ug/dL

## 2017-12-13 LAB — CBC WITH DIFFERENTIAL/PLATELET
Basophils Absolute: 0 10*3/uL (ref 0–0.1)
Basophils Relative: 1 %
Eosinophils Absolute: 0.2 10*3/uL (ref 0–0.7)
Eosinophils Relative: 3 %
HCT: 34.9 % — ABNORMAL LOW (ref 35.0–47.0)
Hemoglobin: 12 g/dL (ref 12.0–16.0)
Lymphocytes Relative: 26 %
Lymphs Abs: 1.4 10*3/uL (ref 1.0–3.6)
MCH: 32 pg (ref 26.0–34.0)
MCHC: 34.5 g/dL (ref 32.0–36.0)
MCV: 92.7 fL (ref 80.0–100.0)
Monocytes Absolute: 0.5 10*3/uL (ref 0.2–0.9)
Monocytes Relative: 9 %
Neutro Abs: 3.2 10*3/uL (ref 1.4–6.5)
Neutrophils Relative %: 61 %
Platelets: 289 10*3/uL (ref 150–440)
RBC: 3.76 MIL/uL — ABNORMAL LOW (ref 3.80–5.20)
RDW: 12.3 % (ref 11.5–14.5)
WBC: 5.3 10*3/uL (ref 3.6–11.0)

## 2017-12-13 LAB — FERRITIN: Ferritin: 20 ng/mL (ref 11–307)

## 2017-12-13 NOTE — Progress Notes (Signed)
lab

## 2017-12-14 ENCOUNTER — Inpatient Hospital Stay: Payer: Medicare HMO

## 2017-12-14 ENCOUNTER — Inpatient Hospital Stay (HOSPITAL_BASED_OUTPATIENT_CLINIC_OR_DEPARTMENT_OTHER): Payer: Medicare HMO | Admitting: Oncology

## 2017-12-14 ENCOUNTER — Other Ambulatory Visit: Payer: Self-pay

## 2017-12-14 VITALS — BP 113/66 | HR 73 | Temp 94.7°F | Resp 18 | Wt 116.0 lb

## 2017-12-14 DIAGNOSIS — D509 Iron deficiency anemia, unspecified: Secondary | ICD-10-CM

## 2017-12-14 NOTE — Progress Notes (Signed)
Over all " feeling tired " per pt- stated" she also has fibromyalgia which also makes her weak and tired " -per pt

## 2017-12-22 ENCOUNTER — Ambulatory Visit: Payer: Self-pay

## 2017-12-22 NOTE — Telephone Encounter (Signed)
[facility] nurse called concerned that pt would not schedule appt prior to 12/25/17 at 11:30 with Dr Damita Dunnings. I tried calling pt and phone rang until the call disconnected. No DPR for [facility].

## 2017-12-22 NOTE — Telephone Encounter (Addendum)
Per chart, pt was offered/declined mult options re: eval.  If worse in the meantime, needs ER eval.  Thanks.

## 2017-12-22 NOTE — Telephone Encounter (Signed)
Pt called back and said that she is laying down now to see if she can get rid of lightheadedness. Pt said she just laid down so not sure if going to help or not. Pt said she does not have transportation to go anywhere today or tomorrow and thinks she will be OK to be seen on 12/25/17. Pt did say if her condition changes or worsens prior to her appt on 12/25/17 pt will call 911 and go to ED. FYI to Dr Damita Dunnings.

## 2017-12-22 NOTE — Telephone Encounter (Signed)
Pt c/o lightheadedness and hearing a "buzzing" sound.  Lightheadedness: Pt feels "off balance". Pt stated she is not having vertigo. Pt stated that it is moderate in intensity (interfering with house work and walking). The lightheadedness began 2 days ago but has worsened.  Heart rate is 52 per minute. Pt has stopped taking the citalopram and the imipramine. Pt stated that the lightheadedness became worse after the citalopram was added (12/05/17).  Pt stated that she has had issues with lightheadedness before. She stated she was anemic and was given an iron infusion. She stated she feels like her heart is beating "harder." Pt c/o buzzing to both of her ears. She rated it moderate in severity. Pt stated that the onset was 2-3 weeks ago after starting the citalopram. The buzzing sound is constant.  Care advice given and pt verbalized understanding. Offered pt an appointment for today, but pt refused. Offered pt [facility], but pt refused. Pt accepted appointment Monday, 12/25/17 at 11:30 with Dr Damita Dunnings. Dr Silvio Pate not on schedule for Monday.  Advised pt is the lightheadedness worsens or develops face, arm or legs numbness or weakness on one side of the body, to call 911 or go to the nearest ED.  Pt stopped taking both the citalopram and imipramine 2 days ago.  Sending to office for review.  Reason for Disposition . [1] MODERATE dizziness (e.g., interferes with normal activities) AND [2] has been evaluated by physician for this . MODERATE-SEVERE tinnitus (i.e., interferes with work, school, or sleep)  Answer Assessment - Initial Assessment Questions 1. DESCRIPTION: "Describe your dizziness."     Off balance 2. LIGHTHEADED: "Do you feel lightheaded?" (e.g., somewhat faint, woozy, weak upon standing)     yes 3. VERTIGO: "Do you feel like either you or the room is spinning or tilting?" (i.e. vertigo)     no 4. SEVERITY: "How bad is it?"  "Do you feel like you are going to faint?" "Can you stand  and walk?"   - MILD - walking normally   - MODERATE - interferes with normal activities (e.g., work, school)    - SEVERE - unable to stand, requires support to walk, feels like passing out now.      moderate 5. ONSET:  "When did the dizziness begin?"     2 days ago but has worsened  6. AGGRAVATING FACTORS: "Does anything make it worse?" (e.g., standing, change in head position)     Standing, walking, change in head position 7. HEART RATE: "Can you tell me your heart rate?" "How many beats in 15 seconds?"  (Note: not all patients can do this)       52 per minute 8. CAUSE: "What do you think is causing the dizziness?"     Either the citalopram and imipramine- stated that lightheadedness began after starting the citalopram 9. RECURRENT SYMPTOM: "Have you had dizziness before?" If so, ask: "When was the last time?" "What happened that time?"     Yes-checked iron and hemoglobin 10. OTHER SYMPTOMS: "Do you have any other symptoms?" (e.g., fever, chest pain, vomiting, diarrhea, bleeding)       Feels like heart is beating harder 11. PREGNANCY: "Is there any chance you are pregnant?" "When was your last menstrual period?"       n/a  Answer Assessment - Initial Assessment Questions 1. DESCRIPTION: "Describe the sound you are hearing." (e.g., hissing, humming, pounding, ringing)     buzzing 2. LOCATION: "One or both ears?" If one, ask: "Which ear?"  Both ears 3. SEVERITY: "How bad is it?"    - MILD - doesn't interfere with normal activities, only can hear in a quiet room    - MODERATE - SEVERE ([place]): interferes with work, school, sleep, or other activities      Moderate  4. ONSET: "When did this begin?" "Did it start suddenly or come on gradually?"     2-3 weeks- suddenly after starting the citalopram 5. PATTERN: "Does this come and go, or has it been constant since it started?"     constant 6. HEARING LOSS: "Is your hearing decreased?" (e.g., normal, decreased)       no 7. OTHER  SYMPTOMS: "Do you have any other symptoms?" (e.g., dizziness, earache)     dizziness 8. PREGNANCY: "Is there any chance you are pregnant?" "When was your last menstrual period?"     n/a  Protocols used: DIZZINESS Heidi Dach, [facility]

## 2017-12-25 ENCOUNTER — Ambulatory Visit (INDEPENDENT_AMBULATORY_CARE_PROVIDER_SITE_OTHER): Payer: Medicare HMO | Admitting: Family Medicine

## 2017-12-25 ENCOUNTER — Encounter: Payer: Self-pay | Admitting: Family Medicine

## 2017-12-25 DIAGNOSIS — R42 Dizziness and giddiness: Secondary | ICD-10-CM | POA: Diagnosis not present

## 2017-12-25 NOTE — Patient Instructions (Signed)
Make sure you are drinking enough water to keep your urine light colored or clear.   Okay to add a little salt to your foods, if needed.   The ear ringing may resolve on its own- give that more time.  Update [place] as needed.  Take care.  Glad to see you.

## 2017-12-25 NOTE — Progress Notes (Signed)
She is off imipramine.  It stopped working for trouble sleeping and she had tinnitus with that.  She was changed to citalopram but she had tinnitus with that. Off med currently.    In for eval, sx started Friday.  No room spinning but felt lightheaded.  No syncope.  Sx better on Saturday.   Recent Hgb 12, stable.  No blood in stool, no hematuria. No FCNAVD. No exertional CP.    D/w pt about getting adequate fluid intake.  No caffeine.    Meds, vitals, and allergies reviewed.   ROS: Per HPI unless specifically indicated in ROS section   GEN: nad, alert and oriented HEENT: mucous membranes moist, TM wnl B, nasal and OP exam wnl.  NECK: supple w/o LA CV: rrr PULM: ctab, no inc wob ABD: soft, +bs EXT: no edema SKIN: no acute rash

## 2017-12-27 DIAGNOSIS — R42 Dizziness and giddiness: Secondary | ICD-10-CM | POA: Insufficient documentation

## 2017-12-27 NOTE — Assessment & Plan Note (Signed)
Could have been related to relative hypotension.  Discussed options.  No focal neurologic symptoms.  Not vertiginous.  Okay for outpatient follow-up. Make sure to drink enough water to keep urine light colored or clear.   Okay to add a little salt to foods, if needed.   The ear ringing may resolve on its own- she'll give that more time.  Update [place] as needed.

## 2018-01-01 ENCOUNTER — Ambulatory Visit
Admission: RE | Admit: 2018-01-01 | Discharge: 2018-01-01 | Disposition: A | Discharge status: Home / Self Care | Payer: Medicare HMO | Source: Ambulatory Visit | Attending: Internal Medicine | Admitting: Internal Medicine

## 2018-01-01 DIAGNOSIS — N6312 Unspecified lump in the right breast, upper inner quadrant: Secondary | ICD-10-CM | POA: Diagnosis not present

## 2018-01-01 DIAGNOSIS — N6311 Unspecified lump in the right breast, upper outer quadrant: Secondary | ICD-10-CM | POA: Diagnosis not present

## 2018-01-01 DIAGNOSIS — R928 Other abnormal and inconclusive findings on diagnostic imaging of breast: Secondary | ICD-10-CM | POA: Diagnosis not present

## 2018-01-02 ENCOUNTER — Other Ambulatory Visit: Payer: Self-pay | Admitting: Internal Medicine

## 2018-01-02 MED ORDER — HYDROCODONE-ACETAMINOPHEN 5-325 MG PO TABS: 1.0000 | ORAL_TABLET | Freq: Three times a day (TID) | ORAL | 0 refills | Status: DC | PRN

## 2018-01-02 NOTE — Telephone Encounter (Signed)
Copied from [place] [phone]. Topic: Quick Communication - Rx Refill/Question >> Jan 02, 2018 10:41 AM Cecelia Byars, NT wrote: Medication:  HYDROcodone-acetaminophen (NORCO/VICODIN) 5-325 MG tablet  Has the patient contacted their pharmacy? yes  (Agent: If no, request that the patient contact the pharmacy for the refill. (Agent: If yes, when and what did the pharmacy advise?  Preferred Pharmacy (with phone number or street name [id number] - [place], [place] [phone] (Phone) [phone] (Fax)    Agent: Please be advised that RX refills may take up to 3 business days. We ask that you follow-up with your pharmacy.

## 2018-01-02 NOTE — Telephone Encounter (Signed)
Name of Medication: hydrocodone apap 5-325 Name of Pharmacy: [facility] or Written Date and Quantity: # 110 on 09/21/17 Last Office Visit and Type: 11/25/17 for 6 wkFU Next Office Visit and Type:03/22/18 AWV Last Controlled Substance Agreement Date: 03/20/17 Last UDS:03/20/17

## 2018-01-02 NOTE — Telephone Encounter (Signed)
See request for refill Hydrocodone-Acetaminophen

## 2018-01-23 ENCOUNTER — Telehealth: Payer: Self-pay | Admitting: Internal Medicine

## 2018-01-23 ENCOUNTER — Ambulatory Visit (INDEPENDENT_AMBULATORY_CARE_PROVIDER_SITE_OTHER)
Admission: RE | Admit: 2018-01-23 | Discharge: 2018-01-23 | Disposition: A | Discharge status: Home / Self Care | Payer: Medicare HMO | Source: Ambulatory Visit | Attending: Internal Medicine | Admitting: Internal Medicine

## 2018-01-23 ENCOUNTER — Encounter: Payer: Self-pay | Admitting: Internal Medicine

## 2018-01-23 ENCOUNTER — Ambulatory Visit (INDEPENDENT_AMBULATORY_CARE_PROVIDER_SITE_OTHER): Payer: Medicare HMO | Admitting: Internal Medicine

## 2018-01-23 VITALS — BP 118/64 | HR 70 | Temp 97.6°F | Ht 62.0 in | Wt 115.0 lb

## 2018-01-23 DIAGNOSIS — M79672 Pain in left foot: Secondary | ICD-10-CM | POA: Diagnosis not present

## 2018-01-23 DIAGNOSIS — S99922A Unspecified injury of left foot, initial encounter: Secondary | ICD-10-CM | POA: Diagnosis not present

## 2018-01-23 NOTE — Telephone Encounter (Signed)
Pt called in stating she is having foot pain. It hurts to walk on it. Pt feels it could possibly be broken. I asked Lindsey Ward if we could use on of the same day slot for Dr.Letvak on Wednesday or Thursday. Was told to use one on Friday. Lindsey Ward was not happy and did not feel she needed to wait until Friday in case her foot is fractured. Please advise

## 2018-01-23 NOTE — Telephone Encounter (Signed)
Please see if she wants to come in right now--I can add her on

## 2018-01-23 NOTE — Telephone Encounter (Signed)
Spoke to pt and appt was made for 1215 today

## 2018-01-23 NOTE — Progress Notes (Signed)
Subjective:    Patient ID: Lindsey Ward, female    DOB: [birth year], 67 y.o.   MRN: [id number]  HPI Here for left foot pain  Dropped a paper shredder on top of her left foot yesterday Significant pain since then Able to walk on it but with pain Not overly swollen  Tried heat on it  Current Outpatient Medications on File Prior to Visit  Medication Sig Dispense Refill  . ALPRAZolam (XANAX) 1 MG tablet Take 1 tablet (1 mg total) by mouth 3 (three) times daily as needed. 270 tablet 0  . cetirizine (ZYRTEC) 10 MG tablet TAKE 1 TABLET BY MOUTH EVERY DAY 30 tablet 11  . HYDROcodone-acetaminophen (NORCO/VICODIN) 5-325 MG tablet Take 1 tablet by mouth 3 (three) times daily as needed. 90 tablet 0  . omeprazole (PRILOSEC) 20 MG capsule Take 1 capsule (20 mg total) by mouth 2 (two) times daily. 180 capsule 1  . promethazine (PHENERGAN) 12.5 MG tablet Take 1 tablet (12.5 mg total) by mouth every 8 (eight) hours as needed for nausea or vomiting. 20 tablet 0  . SUMAtriptan (IMITREX) 50 MG tablet TAKE 0.5-1 TABLET BY MOUTH DAILY AS NEEDED FOR MIGRAINE. 10 tablet 11   No current facility-administered medications on file prior to visit.     Allergies  Allergen Reactions  . Tizanidine Other (See Comments)    "throws me in a different world"  . Aspirin     REACTION: GI bleed  . Buspirone Hcl     REACTION: unspecified  . Ciprofloxacin     REACTION: unspecified  . Citalopram Other (See Comments)    Ear ringing.   . Codeine   . Diazepam   . Duloxetine     REACTION: sz like activity  . Ibuprofen     REACTION: GI bleed  . Imipramine Hcl Other (See Comments)    Ear ringing.   . Oxycodone-Acetaminophen     REACTION: unspecified  . Pregabalin     REACTION: kept her up and confusion  . Propoxyphene Hcl   . Risperidone     REACTION: confusion  . Sulfonamide Derivatives     REACTION: unspecified  . Tramadol Hcl     REACTION: doesn't remember what happened but couldn't tolerate  .  Tramadol Hcl Other (See Comments)    REACTION: doesn't remember what happened but couldn't tolerate REACTION: doesn't remember what happened but couldn't tolerate REACTION: doesn't remember what happened but couldn't tolerate   . Venlafaxine     REACTION: unspecified  . Methocarbamol Rash    Past Medical History:  Diagnosis Date  . Bowel obstruction ([place])   . Chronic fatigue   . Depression   . Fibromyalgia   . GERD (gastroesophageal reflux disease)   . Hyperlipemia   . Migraine   . Obstipation   . Osteopenia   . PUD (peptic ulcer disease)     Past Surgical History:  Procedure Laterality Date  . ABDOMINAL SURGERY    . APPENDECTOMY  2005  . BREAST BIOPSY Bilateral 1999   rt w/out clip - neg, lt w/clip - neg  . BREAST CYST ASPIRATION Right 2003   neg  . CHOLECYSTECTOMY  2004  . COLONOSCOPY WITH PROPOFOL N/A 06/09/2016   Procedure: COLONOSCOPY WITH PROPOFOL;  Surgeon: Jonathon Bellows, MD;  Location: [facility] ENDOSCOPY;  Service: Endoscopy;  Laterality: N/A;  . ESOPHAGOGASTRODUODENOSCOPY (EGD) WITH PROPOFOL N/A 06/09/2016   Procedure: ESOPHAGOGASTRODUODENOSCOPY (EGD) WITH PROPOFOL;  Surgeon: Jonathon Bellows, MD;  Location: [facility] ENDOSCOPY;  Service:  Endoscopy;  Laterality: N/A;  . EYE SURGERY    . GIVENS CAPSULE STUDY N/A 06/23/2016   Procedure: GIVENS CAPSULE STUDY;  Surgeon: Jonathon Bellows, MD;  [facility]  [facility] END[facility];  Service: Endoscopy;  Laterality: N/A;  . PARS PLANA VITRECTOMY W/ REPAIR OF MACULAR HOLE Left 01/2017   [facility]  . TOTAL ABDOMINAL HYSTERECTOMY W/ BILATERAL SALPINGOOPHORECTOMY  1986    Family History  Problem Relation Age of Onset  . Heart disease Mother        AMI  . Stroke Mother   . Heart disease Father   . Hypertension Sister   . Breast cancer Sister   . Hypertension Sister   . Heart disease Sister        MI  . Diabetes Sister   . Kidney cancer Neg Hx   . Kidney disease Neg Hx   . Prostate cancer Neg Hx     Social History   Socioeconomic History  . Marital  status: Single    Spouse name: Not on file  . Number of children: 1  . Years of education: Not on file  . Highest education level: Not on file  Occupational History  . Occupation: DISABLED  Social Needs  . Financial resource strain: Not on file  . Food insecurity:    Worry: Not on file    Inability: Not on file  . Transportation needs:    Medical: Not on file    Non-medical: Not on file  Tobacco Use  . Smoking status: Former Smoker    Packs/day: 1.00    Years: 40.00    Pack years: 40.00    Types: Cigarettes    Last attempt to quit: 09/25/2013    Years since quitting: 4.3  . Smokeless tobacco: Never Used  . Tobacco comment:    Substance and Sexual Activity  . Alcohol use: Yes    Alcohol/week: 0.0 standard drinks    Comment: rarely  . Drug use: No  . Sexual activity: Not on file  Lifestyle  . Physical activity:    Days per week: Not on file    Minutes per session: Not on file  . Stress: Not on file  Relationships  . Social connections:    Talks on phone: Not on file    Gets together: Not on file    Attends religious service: Not on file    Active member of club or organization: Not on file    Attends meetings of clubs or organizations: Not on file    Relationship status: Not on file  . Intimate partner violence:    Fear of current or ex partner: Not on file    Emotionally abused: Not on file    Physically abused: Not on file    Forced sexual activity: Not on file  Other Topics Concern  . Not on file  Social History Narrative   Living alone again      No living will   Daughter should make health care decisions   Requests DNR---done 10/24/14   No tube feeds if cognitively unaware   Highest level of education: 9th   Review of Systems No fever Otherwise feels okay    Objective:   Physical Exam  Constitutional: She appears well-developed. No distress.  Musculoskeletal:  Left foot with some redness and localized tenderness over proximal portion of 1st  metatarsal Toes and ankle negative           Assessment & Plan:

## 2018-01-23 NOTE — Assessment & Plan Note (Addendum)
Significant direct trauma Findings not that striking but does have point tenderness along 1st metatarsal Will check x-ray  X-ray negative Discussed symptomatic care

## 2018-01-25 DIAGNOSIS — H04123 Dry eye syndrome of bilateral lacrimal glands: Secondary | ICD-10-CM | POA: Diagnosis not present

## 2018-01-25 DIAGNOSIS — H01009 Unspecified blepharitis unspecified eye, unspecified eyelid: Secondary | ICD-10-CM | POA: Diagnosis not present

## 2018-02-05 ENCOUNTER — Other Ambulatory Visit: Payer: Self-pay | Admitting: Internal Medicine

## 2018-02-06 NOTE — Telephone Encounter (Signed)
Last filled 11-30-08 #90 Last OV 01-23-18 Next OV 03-22-18 [facility]

## 2018-02-12 ENCOUNTER — Telehealth: Payer: Self-pay | Admitting: *Deleted

## 2018-02-12 NOTE — Telephone Encounter (Signed)
Spoke to pt who c/o fatigue and dizziness x2wks. Pt has not checked her BP but denied s/s of HA or SHoB, but states she has had some abdominal pain and a Hx of bleeding ulcers, which she states was "years ago." I advised pt that she is needing to go to the ED based on her s/s, but pt states she "doesn't have faith in them and would rather wait for Dr Silvio Pate. Appt scheduled for 12/3 and  I advised her that I would send a message at her request for Dr Silvio Pate to review, to determine if she needed to proceed to the ED.

## 2018-02-12 NOTE — Telephone Encounter (Signed)
I think it is okay to wait for tomorrow as long as nothing worsens, no sig CP or SOB Please confirm with her

## 2018-02-12 NOTE — Telephone Encounter (Signed)
Spoke to pt. She said she is not having CP or SOB and she will wait to see [place] tomorrow at 11.

## 2018-02-13 ENCOUNTER — Ambulatory Visit (INDEPENDENT_AMBULATORY_CARE_PROVIDER_SITE_OTHER): Payer: Medicare HMO | Admitting: Internal Medicine

## 2018-02-13 ENCOUNTER — Encounter: Payer: Self-pay | Admitting: Internal Medicine

## 2018-02-13 VITALS — BP 122/74 | HR 62 | Temp 97.4°F | Ht 62.0 in | Wt 113.0 lb

## 2018-02-13 DIAGNOSIS — R079 Chest pain, unspecified: Secondary | ICD-10-CM | POA: Diagnosis not present

## 2018-02-13 LAB — COMPREHENSIVE METABOLIC PANEL
ALT: 18 U/L (ref 0–35)
AST: 30 U/L (ref 0–37)
Albumin: 4.4 g/dL (ref 3.5–5.2)
Alkaline Phosphatase: 95 U/L (ref 39–117)
BUN: 19 mg/dL (ref 6–23)
CO2: 28 mEq/L (ref 19–32)
Calcium: 9.9 mg/dL (ref 8.4–10.5)
Chloride: 100 mEq/L (ref 96–112)
Creatinine, Ser: 0.82 mg/dL (ref 0.40–1.20)
GFR: 73.37 mL/min (ref >60.00)
Glucose, Bld: 106 mg/dL — ABNORMAL HIGH (ref 70–99)
Potassium: 4.1 mEq/L (ref 3.5–5.1)
Sodium: 135 mEq/L (ref 135–145)
Total Bilirubin: 0.4 mg/dL (ref 0.2–1.2)
Total Protein: 7.4 g/dL (ref 6.0–8.3)

## 2018-02-13 LAB — CBC
HCT: 39.5 % (ref 36.0–46.0)
Hemoglobin: 13.3 g/dL (ref 12.0–15.0)
MCHC: 33.7 g/dL (ref 30.0–36.0)
MCV: 92.1 fl (ref 78.0–100.0)
Platelets: 318 10*3/uL (ref 150.0–400.0)
RBC: 4.28 Mil/uL (ref 3.87–5.11)
RDW: 12.7 % (ref 11.5–15.5)
WBC: 8.6 10*3/uL (ref 4.0–10.5)

## 2018-02-13 LAB — T4, FREE: Free T4: 1.4 ng/dL (ref 0.60–1.60)

## 2018-02-13 NOTE — Progress Notes (Signed)
Subjective:    Patient ID: Lindsey Ward, female    DOB: 13-Oct-1950, 67 y.o.   MRN: [id number]  HPI Here due to dizziness Also has "no energy" "I just sit"  This goes back for some months If she gets up quickly she feels dizzy---she will hold on briefly and then it passes No vertigo No syncope Some pain in left chest and down arm--- since last week (when just sitting) Stamina is markedly down recently Some SOB Chronic issues with nausea  Current Outpatient Medications on File Prior to Visit  Medication Sig Dispense Refill  . ALPRAZolam (XANAX) 1 MG tablet TAKE 1 TABLET (1 MG TOTAL) BY MOUTH 3 (THREE) TIMES DAILY AS NEEDED. 90 tablet 0  . cetirizine (ZYRTEC) 10 MG tablet TAKE 1 TABLET BY MOUTH EVERY DAY 30 tablet 11  . HYDROcodone-acetaminophen (NORCO/VICODIN) 5-325 MG tablet Take 1 tablet by mouth 3 (three) times daily as needed. 90 tablet 0  . omeprazole (PRILOSEC) 20 MG capsule Take 1 capsule (20 mg total) by mouth 2 (two) times daily. 180 capsule 1  . promethazine (PHENERGAN) 12.5 MG tablet Take 1 tablet (12.5 mg total) by mouth every 8 (eight) hours as needed for nausea or vomiting. 20 tablet 0  . SUMAtriptan (IMITREX) 50 MG tablet TAKE 0.5-1 TABLET BY MOUTH DAILY AS NEEDED FOR MIGRAINE. 10 tablet 11   No current facility-administered medications on file prior to visit.     Allergies  Allergen Reactions  . Tizanidine Other (See Comments)    "throws me in a different world"  . Aspirin     REACTION: GI bleed  . Buspirone Hcl     REACTION: unspecified  . Ciprofloxacin     REACTION: unspecified  . Citalopram Other (See Comments)    Ear ringing.   . Codeine   . Diazepam   . Duloxetine     REACTION: sz like activity  . Ibuprofen     REACTION: GI bleed  . Imipramine Hcl Other (See Comments)    Ear ringing.   . Oxycodone-Acetaminophen     REACTION: unspecified  . Pregabalin     REACTION: kept her up and confusion  . Propoxyphene Hcl   . Risperidone     REACTION:  confusion  . Sulfonamide Derivatives     REACTION: unspecified  . Tramadol Hcl     REACTION: doesn't remember what happened but couldn't tolerate  . Tramadol Hcl Other (See Comments)    REACTION: doesn't remember what happened but couldn't tolerate REACTION: doesn't remember what happened but couldn't tolerate REACTION: doesn't remember what happened but couldn't tolerate   . Venlafaxine     REACTION: unspecified  . Methocarbamol Rash    Past Medical History:  Diagnosis Date  . Bowel obstruction ([place])   . Chronic fatigue   . Depression   . Fibromyalgia   . GERD (gastroesophageal reflux disease)   . Hyperlipemia   . Migraine   . Obstipation   . Osteopenia   . PUD (peptic ulcer disease)     Past Surgical History:  Procedure Laterality Date  . ABDOMINAL SURGERY    . APPENDECTOMY  2005  . BREAST BIOPSY Bilateral 1999   rt w/out clip - neg, lt w/clip - neg  . BREAST CYST ASPIRATION Right 2003   neg  . CHOLECYSTECTOMY  2004  . COLONOSCOPY WITH PROPOFOL N/A 06/09/2016   Procedure: COLONOSCOPY WITH PROPOFOL;  Surgeon: Jonathon Bellows, MD;  Location: [facility] ENDOSCOPY;  Service: Endoscopy;  Laterality: N/A;  .  ESOPHAGOGASTRODUODENOSCOPY (EGD) WITH PROPOFOL N/A 06/09/2016   Procedure: ESOPHAGOGASTRODUODENOSCOPY (EGD) WITH PROPOFOL;  Surgeon: Jonathon Bellows, MD;  Location: [facility] ENDOSCOPY;  Service: Endoscopy;  Laterality: N/A;  . EYE SURGERY    . GIVENS CAPSULE STUDY N/A 06/23/2016   Procedure: GIVENS CAPSULE STUDY;  Surgeon: Jonathon Bellows, MD;  Location: [facility] ENDOSCOPY;  Service: Endoscopy;  Laterality: N/A;  . PARS PLANA VITRECTOMY W/ REPAIR OF MACULAR HOLE Left 01/2017   [facility]  . TOTAL ABDOMINAL HYSTERECTOMY W/ BILATERAL SALPINGOOPHORECTOMY  1986    Family History  Problem Relation Age of Onset  . Heart disease Mother        AMI  . Stroke Mother   . Heart disease Father   . Hypertension Sister   . Breast cancer Sister   . Hypertension Sister   . Heart disease Sister        MI  .  Diabetes Sister   . Kidney cancer Neg Hx   . Kidney disease Neg Hx   . Prostate cancer Neg Hx     Social History   Socioeconomic History  . Marital status: Single    Spouse name: Not on file  . Number of children: 1  . Years of education: Not on file  . Highest education level: Not on file  Occupational History  . Occupation: DISABLED  Social Needs  . Financial resource strain: Not on file  . Food insecurity:    Worry: Not on file    Inability: Not on file  . Transportation needs:    Medical: Not on file    Non-medical: Not on file  Tobacco Use  . Smoking status: Former Smoker    Packs/day: 1.00    Years: 40.00    Pack years: 40.00    Types: Cigarettes    Last attempt to quit: 09/25/2013    Years since quitting: 4.3  . Smokeless tobacco: Never Used  . Tobacco comment:    Substance and Sexual Activity  . Alcohol use: Yes    Alcohol/week: 0.0 standard drinks    Comment: rarely  . Drug use: No  . Sexual activity: Not on file  Lifestyle  . Physical activity:    Days per week: Not on file    Minutes per session: Not on file  . Stress: Not on file  Relationships  . Social connections:    Talks on phone: Not on file    Gets together: Not on file    Attends religious service: Not on file    Active member of club or organization: Not on file    Attends meetings of clubs or organizations: Not on file    Relationship status: Not on file  . Intimate partner violence:    Fear of current or ex partner: Not on file    Emotionally abused: Not on file    Physically abused: Not on file    Forced sexual activity: Not on file  Other Topics Concern  . Not on file  Social History Narrative   Living alone again      No living will   Daughter should make health care decisions   Requests DNR---done 10/24/14   No tube feeds if cognitively unaware   Highest level of education: 9th   Review of Systems Gets some headaches--feels it on the top of her head (these are not  new) Appetite not good Has lost a little weight Not sleeping new --same as usual Same muscle pain "but I am handling it" No  abdominal pain---other than 2 days of epigastric feeling (like past ulcer) Still constipated--tries MOM     Objective:   Physical Exam  Constitutional: She appears well-developed. No distress.  Neck: No thyromegaly present.  Cardiovascular: Normal rate, regular rhythm, normal heart sounds and intact distal pulses. Exam reveals no gallop.  No murmur heard. Respiratory: Effort normal and breath sounds normal. No respiratory distress. She has no wheezes. She has no rales.  GI: Soft. She exhibits no distension. There is no tenderness. There is no rebound and no guarding.  Musculoskeletal: She exhibits no edema.  Lymphadenopathy:    She has no cervical adenopathy.  Psychiatric: She has a normal mood and affect. Her behavior is normal.           Assessment & Plan:

## 2018-02-13 NOTE — Assessment & Plan Note (Addendum)
Having fatigue, reduced stamina and other vague symptoms Also some atypical CP with radiation to arm Will check EKG  EKG shows sinus brady at 59. Normal axis and intervals. No ischemic changes. No change since 10/05/17 Will check labs Not able to walk on treadmill--will set up cardiology evaluation

## 2018-03-14 DIAGNOSIS — J069 Acute upper respiratory infection, unspecified: Secondary | ICD-10-CM | POA: Diagnosis not present

## 2018-03-14 DIAGNOSIS — R05 Cough: Secondary | ICD-10-CM | POA: Diagnosis not present

## 2018-03-15 ENCOUNTER — Telehealth: Payer: Self-pay

## 2018-03-15 NOTE — Telephone Encounter (Signed)
I don't think there is any problems with imitrex and z-pak

## 2018-03-15 NOTE — Telephone Encounter (Signed)
I spoke with pt and she went to [facility] on 03/14/18; pt was given Z pak and cough med. Pt will cb if appt needed at [facility]. Pt also wants to know if there is interaction between Z pak and imitrex; pt will ck with pharmacist about possible interaction. FYI to Dr Silvio Pate.

## 2018-03-15 NOTE — Telephone Encounter (Signed)
Copied from [place] [id number]. Topic: Appointment Scheduling - Scheduling Inquiry for Clinic >> Mar 13, 2018  1:30 PM Bea Graff, NT wrote: Reason for CRM: Pt would like a call to see if she can be scheduled for 03/15/18 in order to get an antibiotic for a possible sinus infection.

## 2018-03-15 NOTE — Telephone Encounter (Signed)
Spoke to pt

## 2018-03-18 ENCOUNTER — Ambulatory Visit
Admission: EM | Admit: 2018-03-18 | Discharge: 2018-03-18 | Disposition: A | Discharge status: Home / Self Care | Payer: Medicare HMO | Attending: Family Medicine | Admitting: Family Medicine

## 2018-03-18 ENCOUNTER — Ambulatory Visit (INDEPENDENT_AMBULATORY_CARE_PROVIDER_SITE_OTHER): Payer: Medicare HMO

## 2018-03-18 ENCOUNTER — Other Ambulatory Visit: Payer: Self-pay

## 2018-03-18 DIAGNOSIS — R031 Nonspecific low blood-pressure reading: Secondary | ICD-10-CM

## 2018-03-18 DIAGNOSIS — R0602 Shortness of breath: Secondary | ICD-10-CM | POA: Diagnosis not present

## 2018-03-18 DIAGNOSIS — J984 Other disorders of lung: Secondary | ICD-10-CM | POA: Diagnosis not present

## 2018-03-18 DIAGNOSIS — R05 Cough: Secondary | ICD-10-CM

## 2018-03-18 DIAGNOSIS — R059 Cough, unspecified: Secondary | ICD-10-CM

## 2018-03-18 DIAGNOSIS — J069 Acute upper respiratory infection, unspecified: Secondary | ICD-10-CM | POA: Insufficient documentation

## 2018-03-18 LAB — RAPID INFLUENZA A&B ANTIGENS
Influenza A (ARMC): NEGATIVE
Influenza B (ARMC): NEGATIVE

## 2018-03-18 MED ORDER — SPACER/AERO-HOLDING CHAMBERS DEVI: 0 refills | Status: DC

## 2018-03-18 MED ORDER — ALBUTEROL SULFATE HFA 108 (90 BASE) MCG/ACT IN AERS: 2.0000 | INHALATION_SPRAY | Freq: Four times a day (QID) | RESPIRATORY_TRACT | 0 refills | Status: DC | PRN

## 2018-03-18 NOTE — Discharge Instructions (Signed)
Recommend continue cough medication prescribed by Urgent Care in [place]. Recommend use Albuterol inhaler with spacer 2 puffs (inhalations) every 6 hours as needed for cough or chest tightness. Increase fluid intake to help loosen up mucus. Follow-up with your PCP in 3 to 4 days if not improving.

## 2018-03-18 NOTE — ED Triage Notes (Addendum)
Pt was diagnosed with bronchitis last week at urgent care and finished Zpack yesterday but still coughing and is having back and chest pain from coughing so much. Blowing green mucous from her nose. "I'm worried I have pneumonia"

## 2018-03-19 NOTE — ED Provider Notes (Signed)
[facility]    CSN: [id number] Arrival date & time: 03/18/18  1241     History   Chief Complaint Chief Complaint  Patient presents with  . Cough    HPI Lindsey Ward is a 68 y.o. female.   68 year old female presents with cough and chest congestion that started 5 days ago. First had slight cough and congestion and went to Urgent Care in [place] the first day she started with symptoms. They prescribed her a Z-pak and gave her cough medication with decongestant and anti-histamine. She finished the Z-pak today and concerned that she is still coughing and bringing up green mucus. Having minimal nasal congestion and denies any fever, sore throat, or vomiting. Experiencing some back pain from coughing as well as chills and nausea. Has not taken any OTC medication for symptoms. Previous smoker. Has extensive "reactions" to prior medications and chronic health issues include fibromyalgia, anxiety, depression, GERD, peptic ulcer disease, and migraine headaches. Currently on Prilosec, Xanax, Vicodin daily and Phenergan and Imitrex prn.   The history is provided by the patient.    Past Medical History:  Diagnosis Date  . Bowel obstruction ([place])   . Chronic fatigue   . Depression   . Fibromyalgia   . GERD (gastroesophageal reflux disease)   . Hyperlipemia   . Migraine   . Obstipation   . Osteopenia   . PUD (peptic ulcer disease)     Patient Active Problem List   Diagnosis Date Noted  . Chest pain 02/13/2018  . Left foot pain 01/23/2018  . Lightheaded 12/27/2017  . Iron deficiency anemia 06/05/2016  . Constipation 05/17/2016  . LPRD (laryngopharyngeal reflux disease) 09/21/2015  . Chronic low back pain with right-sided sciatica 06/17/2015  . Advance directive discussed with patient 10/24/2014  . Routine general medical examination at a health care facility 07/14/2010  . OSTEOPENIA 03/10/2009  . Episodic mood disorder ([place]) 08/15/2006  . Gastroesophageal reflux  disease 08/15/2006  . Fibromyalgia 08/15/2006  . Sleep disturbance 08/15/2006  . Hyperlipidemia 12/20/2005  . PANIC ATTACKS 12/20/2005  . Migraine without status migrainosus, not intractable 12/20/2005    Past Surgical History:  Procedure Laterality Date  . ABDOMINAL SURGERY    . APPENDECTOMY  2005  . BREAST BIOPSY Bilateral 1999   rt w/out clip - neg, lt w/clip - neg  . BREAST CYST ASPIRATION Right 2003   neg  . CHOLECYSTECTOMY  2004  . COLONOSCOPY WITH PROPOFOL N/A 06/09/2016   Procedure: COLONOSCOPY WITH PROPOFOL;  Surgeon: Jonathon Bellows, MD;  Location: [facility] ENDOSCOPY;  Service: Endoscopy;  Laterality: N/A;  . ESOPHAGOGASTRODUODENOSCOPY (EGD) WITH PROPOFOL N/A 06/09/2016   Procedure: ESOPHAGOGASTRODUODENOSCOPY (EGD) WITH PROPOFOL;  Surgeon: Jonathon Bellows, MD;  Location: [facility] ENDOSCOPY;  Service: Endoscopy;  Laterality: N/A;  . EYE SURGERY    . GIVENS CAPSULE STUDY N/A 06/23/2016   Procedure: GIVENS CAPSULE STUDY;  Surgeon: Jonathon Bellows, MD;  Location: [facility] ENDOSCOPY;  Service: Endoscopy;  Laterality: N/A;  . PARS PLANA VITRECTOMY W/ REPAIR OF MACULAR HOLE Left 01/2017   [facility]  . TOTAL ABDOMINAL HYSTERECTOMY W/ BILATERAL SALPINGOOPHORECTOMY  1986    OB History   No obstetric history on file.      Home Medications    Prior to Admission medications   Medication Sig Start Date End Date Taking? Authorizing Provider  albuterol (PROVENTIL HFA;VENTOLIN HFA) 108 (90 Base) MCG/ACT inhaler Inhale 2 puffs into the lungs every 6 (six) hours as needed for wheezing or shortness of breath.  03/18/18   Katy Apo, NP  ALPRAZolam (XANAX) 1 MG tablet TAKE 1 TABLET (1 MG TOTAL) BY MOUTH 3 (THREE) TIMES DAILY AS NEEDED. 02/06/18   Venia Carbon, MD  cetirizine (ZYRTEC) 10 MG tablet TAKE 1 TABLET BY MOUTH EVERY DAY 05/23/16   Venia Carbon, MD  HYDROcodone-acetaminophen (NORCO/VICODIN) 5-325 MG tablet Take 1 tablet by mouth 3 (three) times daily as needed. 01/02/18   Venia Carbon, MD    omeprazole (PRILOSEC) 20 MG capsule Take 1 capsule (20 mg total) by mouth 2 (two) times daily. 10/05/17   Venia Carbon, MD  promethazine (PHENERGAN) 12.5 MG tablet Take 1 tablet (12.5 mg total) by mouth every 8 (eight) hours as needed for nausea or vomiting. 12/05/17   Venia Carbon, MD  Spacer/Aero-Holding Josiah Lobo DEVI Use spacer with Albuterol inhaler as directed. 03/18/18   Katy Apo, NP  SUMAtriptan (IMITREX) 50 MG tablet TAKE 0.5-1 TABLET BY MOUTH DAILY AS NEEDED FOR MIGRAINE. 12/05/17   Venia Carbon, MD    Family History Family History  Problem Relation Age of Onset  . Heart disease Mother        AMI  . Stroke Mother   . Heart disease Father   . Hypertension Sister   . Breast cancer Sister   . Hypertension Sister   . Heart disease Sister        MI  . Diabetes Sister   . Kidney cancer Neg Hx   . Kidney disease Neg Hx   . Prostate cancer Neg Hx     Social History Social History   Tobacco Use  . Smoking status: Former Smoker    Packs/day: 1.00    Years: 40.00    Pack years: 40.00    Types: Cigarettes    Last attempt to quit: 09/25/2013    Years since quitting: 4.4  . Smokeless tobacco: Never Used  . Tobacco comment:    Substance Use Topics  . Alcohol use: Not Currently    Alcohol/week: 0.0 standard drinks    Comment: rarely  . Drug use: No     Allergies   Tizanidine; Aspirin; Buspirone hcl; Ciprofloxacin; Citalopram; Codeine; Diazepam; Duloxetine; Ibuprofen; Imipramine hcl; Oxycodone-acetaminophen; Pregabalin; Propoxyphene hcl; Risperidone; Sulfonamide derivatives; Tramadol hcl; Tramadol hcl; Venlafaxine; and Methocarbamol   Review of Systems Review of Systems  Constitutional: Positive for chills and fatigue. Negative for activity change, appetite change, diaphoresis and fever.  HENT: Positive for congestion and postnasal drip. Negative for ear discharge, ear pain, facial swelling, mouth sores, nosebleeds, rhinorrhea, sinus pressure, sinus  pain, sneezing, sore throat and trouble swallowing.   Eyes: Negative for pain, discharge, redness and itching.  Respiratory: Positive for cough and chest tightness. Negative for shortness of breath and wheezing.   Cardiovascular: Negative for chest pain.  Gastrointestinal: Positive for nausea. Negative for vomiting.  Musculoskeletal: Positive for arthralgias and myalgias. Negative for neck pain and neck stiffness.  Skin: Negative for color change, rash and wound.  Allergic/Immunologic: Positive for environmental allergies and food allergies.  Neurological: Positive for headaches. Negative for dizziness, tremors, seizures, syncope, speech difficulty, weakness, light-headedness and numbness.  Hematological: Negative for adenopathy. Does not bruise/bleed easily.  Psychiatric/Behavioral: The patient is nervous/anxious.      Physical Exam Triage Vital Signs ED Triage Vitals  Enc Vitals Group     BP 03/18/18 1259 (!) 105/46     Pulse Rate 03/18/18 1259 80     Resp 03/18/18 1259 18  Temp 03/18/18 1259 97.9 F (36.6 C)     Temp Source 03/18/18 1259 Oral     SpO2 03/18/18 1259 97 %     Weight 03/18/18 1301 113 lb (51.3 kg)     Height 03/18/18 1301 5\' 2"  (1.575 m)     Head Circumference --      Peak Flow --      Pain Score 03/18/18 1300 9     Pain Loc --      Pain Edu? --      Excl. in [place]? --    No data found.  Updated Vital Signs BP (!) 105/46 (BP Location: Left Arm)   Pulse 80   Temp 97.9 F (36.6 C) (Oral)   Resp 18   Ht 5\' 2"  (1.575 m)   Wt 113 lb (51.3 kg)   SpO2 97%   BMI 20.67 kg/m   Visual Acuity Right Eye Distance:   Left Eye Distance:   Bilateral Distance:    Right Eye Near:   Left Eye Near:    Bilateral Near:     Physical Exam Vitals signs and nursing note reviewed.  Constitutional:      General: She is awake. She is not in acute distress.    Appearance: Normal appearance. She is well-developed, well-groomed and underweight. She is not ill-appearing.      Comments: Patient sitting comfortably on exam table in no acute distress. Walking with no difficulty and talking in complete sentences.   HENT:     Head: Normocephalic and atraumatic.     Right Ear: Hearing, tympanic membrane, ear canal and external ear normal.     Left Ear: Hearing, tympanic membrane, ear canal and external ear normal.     Nose: Nose normal.     Right Sinus: No maxillary sinus tenderness or frontal sinus tenderness.     Left Sinus: No maxillary sinus tenderness or frontal sinus tenderness.     Mouth/Throat:     Lips: Pink.     Mouth: Mucous membranes are moist.     Pharynx: No pharyngeal swelling, oropharyngeal exudate or posterior oropharyngeal erythema.  Eyes:     Extraocular Movements: Extraocular movements intact.     Conjunctiva/sclera: Conjunctivae normal.  Neck:     Musculoskeletal: Normal range of motion and neck supple. No muscular tenderness.  Cardiovascular:     Rate and Rhythm: Normal rate and regular rhythm.     Pulses: Normal pulses.     Heart sounds: Normal heart sounds. No murmur.  Pulmonary:     Effort: Pulmonary effort is normal. No tachypnea, accessory muscle usage or respiratory distress.     Breath sounds: Normal air entry. No decreased air movement. Examination of the right-upper field reveals wheezing and rhonchi. Examination of the left-upper field reveals wheezing and rhonchi. Examination of the right-lower field reveals wheezing. Examination of the left-lower field reveals wheezing. Wheezing and rhonchi (when coughing) present. No decreased breath sounds or rales.  Musculoskeletal: Normal range of motion.  Lymphadenopathy:     Cervical: No cervical adenopathy.  Skin:    General: Skin is warm and dry.     Capillary Refill: Capillary refill takes less than 2 seconds.     Findings: No rash.  Neurological:     General: No focal deficit present.     Mental Status: She is alert and oriented to person, place, and time.  Psychiatric:         Attention and Perception: Attention and perception normal.  Mood and Affect: Affect normal. Mood is anxious.        Speech: Speech normal.        Behavior: Behavior normal. Behavior is cooperative.      UC Treatments / Results  Labs (all labs ordered are listed, but only abnormal results are displayed) Labs Reviewed  RAPID INFLUENZA A&B ANTIGENS ([place])    EKG None  Radiology Dg Chest 2 View  Result Date: 03/18/2018 CLINICAL DATA:  Cough with intermittent fevers for 4 days. EXAM: CHEST - 2 VIEW COMPARISON:  10/05/2017 FINDINGS: Normal heart, mediastinum and hila. Mild stable scarring at the apices. Mild scarring at the anterior lung base in the right middle lobe, also stable. Lungs are hyperexpanded, but otherwise clear. No pleural effusion or pneumothorax. Skeletal structures are intact. IMPRESSION: No active cardiopulmonary disease. Electronically Signed   By: Lajean Manes M.D.   On: 03/18/2018 14:30    Procedures Procedures (including critical care time)  Medications Ordered in UC Medications - No data to display  Initial Impression / Assessment and Plan / UC Course  I have reviewed the triage vital signs and the nursing notes.  Pertinent labs & imaging results that were available during my care of the patient were reviewed by me and considered in my medical decision making (see chart for details).    Reviewed negative rapid influenza test results and chest x-ray results with patient. Discussed limitations of influenza testing. No pneumonia seen on x-ray. Discussed that she probably has a viral illness. Recommend continue cough medication previously prescribed. May use Albuterol inhaler with spacer 2 puffs every 6 hours as needed for wheezing or cough. Continue to monitor blood pressure. Discussed repeating blood pressure measurement but patient left before obtaining another reading. Patient stable and in no distress. Recommend continue to push fluids to help loosen  up mucus. Follow-up with her PCP in 3 days if not improving.  Final Clinical Impressions(s) / UC Diagnoses   Final diagnoses:  Cough  Shortness of breath  Low blood pressure reading  Viral upper respiratory tract infection     Discharge Instructions     Recommend continue cough medication prescribed by Urgent Care in [place]. Recommend use Albuterol inhaler with spacer 2 puffs (inhalations) every 6 hours as needed for cough or chest tightness. Increase fluid intake to help loosen up mucus. Follow-up with your PCP in 3 to 4 days if not improving.     ED Prescriptions    Medication Sig Dispense Auth. Provider   albuterol (PROVENTIL HFA;VENTOLIN HFA) 108 (90 Base) MCG/ACT inhaler Inhale 2 puffs into the lungs every 6 (six) hours as needed for wheezing or shortness of breath. 1 Inhaler Shanta Hartner, Nicholes Stairs, NP   Spacer/Aero-Holding Dorise Bullion Use spacer with Albuterol inhaler as directed. 1 each Katy Apo, NP     Controlled Substance Prescriptions [place] Controlled Substance Registry consulted? Yes, I have consulted the [place] Controlled Substances Registry for this patient. I do not feel a prescription for a controlled substance is warranted at this time.    Katy Apo, NP 03/19/18 1151

## 2018-03-22 ENCOUNTER — Ambulatory Visit (INDEPENDENT_AMBULATORY_CARE_PROVIDER_SITE_OTHER): Payer: Medicare HMO | Admitting: Internal Medicine

## 2018-03-22 ENCOUNTER — Encounter: Payer: Self-pay | Admitting: Internal Medicine

## 2018-03-22 VITALS — BP 110/72 | HR 65 | Temp 98.1°F | Ht 62.0 in | Wt 114.0 lb

## 2018-03-22 DIAGNOSIS — M797 Fibromyalgia: Secondary | ICD-10-CM

## 2018-03-22 DIAGNOSIS — G43009 Migraine without aura, not intractable, without status migrainosus: Secondary | ICD-10-CM

## 2018-03-22 DIAGNOSIS — F1120 Opioid dependence, uncomplicated: Secondary | ICD-10-CM

## 2018-03-22 DIAGNOSIS — F39 Unspecified mood [affective] disorder: Secondary | ICD-10-CM | POA: Diagnosis not present

## 2018-03-22 DIAGNOSIS — F1320 Sedative, hypnotic or anxiolytic dependence, uncomplicated: Secondary | ICD-10-CM | POA: Diagnosis not present

## 2018-03-22 DIAGNOSIS — Z0000 Encounter for general adult medical examination without abnormal findings: Secondary | ICD-10-CM

## 2018-03-22 DIAGNOSIS — Z7189 Other specified counseling: Secondary | ICD-10-CM

## 2018-03-22 MED ORDER — ALPRAZOLAM 1 MG PO TABS: 1.0000 mg | ORAL_TABLET | Freq: Three times a day (TID) | ORAL | 0 refills | Status: DC | PRN

## 2018-03-22 MED ORDER — PROMETHAZINE HCL 12.5 MG PO TABS: 12.5000 mg | ORAL_TABLET | Freq: Three times a day (TID) | ORAL | 0 refills | Status: DC | PRN

## 2018-03-22 MED ORDER — HYDROCODONE-ACETAMINOPHEN 5-325 MG PO TABS: 1.0000 | ORAL_TABLET | Freq: Three times a day (TID) | ORAL | 0 refills | Status: DC | PRN

## 2018-03-22 NOTE — Assessment & Plan Note (Signed)
Dysthymia and severe anxiety Hasn't tolerated SSRIs, etc Continue the xanax

## 2018-03-22 NOTE — Progress Notes (Signed)
Subjective:    Patient ID: Lindsey Ward, female    DOB: [birth year], 68 y.o.   MRN: [id number]  HPI Here for Medicare wellness visit and follow up of chronic health conditions Reviewed form and advanced directives Reviewed other doctors No alcohol or tobacco Tries to walk (warmer weather)---discussed No falls Chronic mood issues Vision and hearing are okay Independent with instrumental ADLs Memory is okay  Has had respiratory illness Seen at urgent care 1/1 Given z-pak Still doesn't feel good---some cough and sneezing Slight SOB No fever  Still has buzzing in her ears  Wonders about bad neck pain in the morning ??muscle relaxer Sleeps on 2 soft pillows---discussed trying more suport Uses the hydrocodone prn--average is one a day  Still gets migraines at times imitrex with xanax--then going back to sleep works Uses the xanax for her chronic anxiety on regular basis Episodic depression--most days No suicidal ideation Not anhedonic---but lonely. Discussed Feels very limited due to the fibromyalgia----hard to do anything Considering trying water aerobics  Still gets nausea Had GI work up Phenergan helps but not consistently Rare heartburn  Occasional chest pain---tightness (not exertional and goes away) No SOB--other than the illness Occ palpitations--not often. Will see cardiology next month No dizziness or syncope  Current Outpatient Medications on File Prior to Visit  Medication Sig Dispense Refill  . albuterol (PROVENTIL HFA;VENTOLIN HFA) 108 (90 Base) MCG/ACT inhaler Inhale 2 puffs into the lungs every 6 (six) hours as needed for wheezing or shortness of breath. 1 Inhaler 0  . ALPRAZolam (XANAX) 1 MG tablet TAKE 1 TABLET (1 MG TOTAL) BY MOUTH 3 (THREE) TIMES DAILY AS NEEDED. 90 tablet 0  . cetirizine (ZYRTEC) 10 MG tablet TAKE 1 TABLET BY MOUTH EVERY DAY 30 tablet 11  . HYDROcodone-acetaminophen (NORCO/VICODIN) 5-325 MG tablet Take 1 tablet by mouth 3 (three)  times daily as needed. 90 tablet 0  . omeprazole (PRILOSEC) 20 MG capsule Take 1 capsule (20 mg total) by mouth 2 (two) times daily. 180 capsule 1  . promethazine (PHENERGAN) 12.5 MG tablet Take 1 tablet (12.5 mg total) by mouth every 8 (eight) hours as needed for nausea or vomiting. 20 tablet 0  . Spacer/Aero-Holding Chambers DEVI Use spacer with Albuterol inhaler as directed. 1 each 0  . SUMAtriptan (IMITREX) 50 MG tablet TAKE 0.5-1 TABLET BY MOUTH DAILY AS NEEDED FOR MIGRAINE. 10 tablet 11   No current facility-administered medications on file prior to visit.     Allergies  Allergen Reactions  . Tizanidine Other (See Comments)    "throws me in a different world"  . Aspirin     REACTION: GI bleed  . Buspirone Hcl     REACTION: unspecified  . Ciprofloxacin     REACTION: unspecified  . Citalopram Other (See Comments)    Ear ringing.   . Codeine   . Diazepam   . Duloxetine     REACTION: sz like activity  . Ibuprofen     REACTION: GI bleed  . Imipramine Hcl Other (See Comments)    Ear ringing.   . Oxycodone-Acetaminophen     REACTION: unspecified  . Pregabalin     REACTION: kept her up and confusion  . Propoxyphene Hcl   . Risperidone     REACTION: confusion  . Sulfonamide Derivatives     REACTION: unspecified  . Tramadol Hcl     REACTION: doesn't remember what happened but couldn't tolerate  . Tramadol Hcl Other (See Comments)    REACTION:  doesn't remember what happened but couldn't tolerate REACTION: doesn't remember what happened but couldn't tolerate REACTION: doesn't remember what happened but couldn't tolerate   . Venlafaxine     REACTION: unspecified  . Methocarbamol Rash    Past Medical History:  Diagnosis Date  . Bowel obstruction ([place])   . Chronic fatigue   . Depression   . Fibromyalgia   . GERD (gastroesophageal reflux disease)   . Hyperlipemia   . Migraine   . Obstipation   . Osteopenia   . PUD (peptic ulcer disease)     Past Surgical History:   Procedure Laterality Date  . ABDOMINAL SURGERY    . APPENDECTOMY  2005  . BREAST BIOPSY Bilateral 1999   rt w/out clip - neg, lt w/clip - neg  . BREAST CYST ASPIRATION Right 2003   neg  . CHOLECYSTECTOMY  2004  . COLONOSCOPY WITH PROPOFOL N/A 06/09/2016   Procedure: COLONOSCOPY WITH PROPOFOL;  Surgeon: Jonathon Bellows, MD;  Location: [facility] ENDOSCOPY;  Service: Endoscopy;  Laterality: N/A;  . ESOPHAGOGASTRODUODENOSCOPY (EGD) WITH PROPOFOL N/A 06/09/2016   Procedure: ESOPHAGOGASTRODUODENOSCOPY (EGD) WITH PROPOFOL;  Surgeon: Jonathon Bellows, MD;  Location: [facility] ENDOSCOPY;  Service: Endoscopy;  Laterality: N/A;  . EYE SURGERY    . GIVENS CAPSULE STUDY N/A 06/23/2016   Procedure: GIVENS CAPSULE STUDY;  Surgeon: Jonathon Bellows, MD;  Location: [facility] ENDOSCOPY;  Service: Endoscopy;  Laterality: N/A;  . PARS PLANA VITRECTOMY W/ REPAIR OF MACULAR HOLE Left 01/2017   [facility]  . TOTAL ABDOMINAL HYSTERECTOMY W/ BILATERAL SALPINGOOPHORECTOMY  1986    Family History  Problem Relation Age of Onset  . Heart disease Mother        AMI  . Stroke Mother   . Heart disease Father   . Hypertension Sister   . Breast cancer Sister   . Hypertension Sister   . Heart disease Sister        MI  . Diabetes Sister   . Kidney cancer Neg Hx   . Kidney disease Neg Hx   . Prostate cancer Neg Hx     Social History   Socioeconomic History  . Marital status: Single    Spouse name: Not on file  . Number of children: 1  . Years of education: Not on file  . Highest education level: Not on file  Occupational History  . Occupation: DISABLED  Social Needs  . Financial resource strain: Not on file  . Food insecurity:    Worry: Not on file    Inability: Not on file  . Transportation needs:    Medical: Not on file    Non-medical: Not on file  Tobacco Use  . Smoking status: Former Smoker    Packs/day: 1.00    Years: 40.00    Pack years: 40.00    Types: Cigarettes    Last attempt to quit: 09/25/2013    Years since quitting:  4.4  . Smokeless tobacco: Never Used  . Tobacco comment:    Substance and Sexual Activity  . Alcohol use: Not Currently    Alcohol/week: 0.0 standard drinks    Comment: rarely  . Drug use: No  . Sexual activity: Not on file  Lifestyle  . Physical activity:    Days per week: Not on file    Minutes per session: Not on file  . Stress: Not on file  Relationships  . Social connections:    Talks on phone: Not on file    Gets together: Not on file  Attends religious service: Not on file    Active member of club or organization: Not on file    Attends meetings of clubs or organizations: Not on file    Relationship status: Not on file  . Intimate partner violence:    Fear of current or ex partner: Not on file    Emotionally abused: Not on file    Physically abused: Not on file    Forced sexual activity: Not on file  Other Topics Concern  . Not on file  Social History Narrative   Living alone again      No living will   Daughter should make health care decisions   Requests DNR---done 10/24/14   No tube feeds if cognitively unaware   Highest level of education: 9th   Review of Systems Doesn't sleep well Appetite still not good---but weight stable Wears seat belt Teeth are fie. Keeps up Gets itching at night and will get red bumps. (no dermatologist). Uses CBD oil Some burning in feet---ongoing Bowels are never okay---twice a week Voids okay. No incontinence Some knee pain at times    Objective:   Physical Exam  Constitutional: She is oriented to person, place, and time. She appears well-developed. No distress.  HENT:  Mouth/Throat: Oropharynx is clear and moist. No oropharyngeal exudate.  Neck: No thyromegaly present.  Cardiovascular: Normal rate, regular rhythm, normal heart sounds and intact distal pulses. Exam reveals no gallop.  No murmur heard. Respiratory: Effort normal and breath sounds normal. No respiratory distress. She has no wheezes. She has no rales.  GI:  Soft. There is no abdominal tenderness.  Musculoskeletal:        General: No edema.  Lymphadenopathy:    She has no cervical adenopathy.  Neurological: She is alert and oriented to person, place, and time.  President--- "Dwaine Deter, Bush" 100-93-...Marland KitchenMarland Kitchen. D-l-r-[id number]-w Recall 2/3  Skin: No rash noted. No erythema.  Psychiatric: She has a normal mood and affect. Her behavior is normal.           Assessment & Plan:

## 2018-03-22 NOTE — Assessment & Plan Note (Signed)
Chronic pain Uses xanax, hydrocodone Avoid muscle relaxer New pillow?

## 2018-03-22 NOTE — Assessment & Plan Note (Signed)
Chronic severe anxiety and hasn't tolerated other meds CSRS fine

## 2018-03-22 NOTE — Progress Notes (Signed)
Hearing Screening   Method: Audiometry   125Hz 250Hz 500Hz 1000Hz 2000Hz 3000Hz 4000Hz 6000Hz 8000Hz  Right ear:   20 20 20  20    Left ear:   20 20 20  20    Vision Screening Comments: July 2019   

## 2018-03-22 NOTE — Assessment & Plan Note (Signed)
Averages one per day CSRS fine

## 2018-03-22 NOTE — Assessment & Plan Note (Signed)
Uses the imitrex prn

## 2018-03-22 NOTE — Assessment & Plan Note (Signed)
Has DNR 

## 2018-03-22 NOTE — Assessment & Plan Note (Signed)
I have personally reviewed the Medicare Annual Wellness questionnaire and have noted 1. The patient's medical and social history 2. Their use of alcohol, tobacco or illicit drugs 3. Their current medications and supplements 4. The patient's functional ability including ADL's, fall risks, home safety risks and hearing or visual             impairment. 5. Diet and physical activities 6. Evidence for depression or mood disorders  The patients weight, height, BMI and visual acuity have been recorded in the chart I have made referrals, counseling and provided education to the patient based review of the above and I have provided the pt with a written personalized care plan for preventive services.  I have provided you with a copy of your personalized plan for preventive services. Please take the time to review along with your updated medication list.  Yearly mammograms Just had colonoscopy Td if any injury Defer shingrix due to cost Discussed exercise

## 2018-03-23 ENCOUNTER — Telehealth: Payer: Self-pay | Admitting: Internal Medicine

## 2018-03-23 MED ORDER — HYDROCODONE-ACETAMINOPHEN 5-325 MG PO TABS: 1.0000 | ORAL_TABLET | Freq: Three times a day (TID) | ORAL | 0 refills | Status: DC | PRN

## 2018-03-23 NOTE — Telephone Encounter (Signed)
Pt called office stating she was able to get 7 pills for the hydrocodone from [facility], but the remaining pills Dr.Letvak will have to fax another prescription over.

## 2018-03-23 NOTE — Telephone Encounter (Signed)
Because she gets it so infrequent, they think it is acute in use. We also need to add the Dx code.

## 2018-03-23 NOTE — Telephone Encounter (Signed)
Pt need returning call concerning previous message about her medication and why it was denied

## 2018-03-23 NOTE — Telephone Encounter (Signed)
Spoke to pt. She will let us know if she is able to get the entire rx.

## 2018-03-26 ENCOUNTER — Telehealth: Payer: Self-pay | Admitting: Internal Medicine

## 2018-03-26 NOTE — Telephone Encounter (Signed)
Spoke to pt. She said she got the 7 day rx on Friday and will go pickup the full rx that was sent 03-23-18 today.

## 2018-03-26 NOTE — Telephone Encounter (Signed)
Pt called stating she did go pick her 7 day supply of hydrocodone  At [facility] on 03/24/2018

## 2018-03-27 LAB — PAIN MGMT, PROFILE 8 W/CONF, U
6 Acetylmorphine: NEGATIVE ng/mL (ref <10)
Alcohol Metabolites: NEGATIVE ng/mL (ref <500)
Alphahydroxyalprazolam: 270 ng/mL — ABNORMAL HIGH (ref <25)
Alphahydroxymidazolam: NEGATIVE ng/mL (ref <50)
Alphahydroxytriazolam: NEGATIVE ng/mL (ref <50)
Aminoclonazepam: NEGATIVE ng/mL (ref <25)
Amphetamines: NEGATIVE ng/mL (ref <500)
Benzodiazepines: POSITIVE ng/mL — AB (ref <100)
Buprenorphine, Urine: NEGATIVE ng/mL (ref <5)
Cocaine Metabolite: NEGATIVE ng/mL (ref <150)
Codeine: NEGATIVE ng/mL (ref <50)
Creatinine: 36.1 mg/dL
Hydrocodone: NEGATIVE ng/mL (ref <50)
Hydromorphone: NEGATIVE ng/mL (ref <50)
Hydroxyethylflurazepam: NEGATIVE ng/mL (ref <50)
Lorazepam: NEGATIVE ng/mL (ref <50)
MDMA: NEGATIVE ng/mL (ref <500)
Marijuana Metabolite: NEGATIVE ng/mL (ref <20)
Morphine: NEGATIVE ng/mL (ref <50)
Nordiazepam: NEGATIVE ng/mL (ref <50)
Norhydrocodone: 91 ng/mL — ABNORMAL HIGH (ref <50)
Opiates: POSITIVE ng/mL — AB (ref <100)
Oxazepam: NEGATIVE ng/mL (ref <50)
Oxidant: NEGATIVE ug/mL (ref <200)
Oxycodone: NEGATIVE ng/mL (ref <100)
Temazepam: NEGATIVE ng/mL (ref <50)
pH: 7.15 (ref 4.5–9.0)

## 2018-04-10 ENCOUNTER — Encounter: Payer: Self-pay | Admitting: Family Medicine

## 2018-04-10 ENCOUNTER — Telehealth: Payer: Self-pay

## 2018-04-10 ENCOUNTER — Ambulatory Visit (INDEPENDENT_AMBULATORY_CARE_PROVIDER_SITE_OTHER): Payer: Medicare HMO | Admitting: Family Medicine

## 2018-04-10 VITALS — BP 126/62 | HR 55 | Temp 97.9°F | Ht 62.0 in | Wt 114.5 lb

## 2018-04-10 DIAGNOSIS — S161XXA Strain of muscle, fascia and tendon at neck level, initial encounter: Secondary | ICD-10-CM | POA: Insufficient documentation

## 2018-04-10 NOTE — Progress Notes (Signed)
Subjective:     Lindsey Ward is a 68 y.o. female presenting for Neck Pain (right side, right shoulder and right thigh pain started this morning. neck hurts all the way across. This is different from her usual fibromyalgia symptoms.)     Neck Pain   This is a new problem. The current episode started yesterday. The problem occurs constantly. The problem has been gradually worsening. The pain is associated with nothing. The pain is present in the right side. The quality of the pain is described as aching and cramping. The pain is severe. The symptoms are aggravated by position. The pain is same all the time. Associated symptoms include leg pain. Pertinent negatives include no chest pain, fever, headaches, paresis, syncope or weakness. She has tried NSAIDs and oral narcotics for the symptoms. The treatment provided mild relief.        Review of Systems  Constitutional: Negative for fever.  Cardiovascular: Negative for chest pain and syncope.  Musculoskeletal: Positive for neck pain.  Neurological: Negative for weakness and headaches.     Social History   Tobacco Use  Smoking Status Former Smoker  . Packs/day: 1.00  . Years: 40.00  . Pack years: 40.00  . Types: Cigarettes  . Last attempt to quit: 09/25/2013  . Years since quitting: 4.5  Smokeless Tobacco Never Used  Tobacco Comment            Objective:    BP Readings from Last 3 Encounters:  04/10/18 126/62  03/22/18 110/72  03/18/18 (!) 105/46   Wt Readings from Last 3 Encounters:  04/10/18 114 lb 8 oz (51.9 kg)  03/22/18 114 lb (51.7 kg)  03/18/18 113 lb (51.3 kg)    BP 126/62   Pulse (!) 55   Temp 97.9 F (36.6 C)   Ht 5\' 2"  (1.575 m)   Wt 114 lb 8 oz (51.9 kg)   SpO2 98%   BMI 20.94 kg/m    Physical Exam Constitutional:      General: She is not in acute distress.    Appearance: She is well-developed. She is not diaphoretic.  HENT:     Right Ear: External ear normal.     Left Ear: External ear  normal.     Nose: Nose normal.  Eyes:     Conjunctiva/sclera: Conjunctivae normal.  Neck:     Musculoskeletal: Normal range of motion. Neck rigidity (with turning the neck), pain with movement (in all directions, worse with tilting and rotation to the Right) and muscular tenderness present. No edema, erythema, injury or spinous process tenderness.     Comments: Neg spurling sign bilaterally Cardiovascular:     Rate and Rhythm: Normal rate.  Pulmonary:     Effort: Pulmonary effort is normal.  Musculoskeletal:     Lumbar back: She exhibits normal range of motion, no tenderness and no bony tenderness.     Comments: Decreased hip flexor strength, but otherwise normal LE strength. Neg straight leg raise Normal upper body strength b/l.   Lymphadenopathy:     Cervical: No cervical adenopathy.  Skin:    General: Skin is warm and dry.     Capillary Refill: Capillary refill takes less than 2 seconds.  Neurological:     Mental Status: She is alert. Mental status is at baseline.     Deep Tendon Reflexes:     Reflex Scores:      Bicep reflexes are 2+ on the right side and 2+ on the left side.  Patellar reflexes are 2+ on the right side and 2+ on the left side.      Achilles reflexes are 2+ on the right side and 2+ on the left side. Psychiatric:        Mood and Affect: Mood normal.        Behavior: Behavior normal.           Assessment & Plan:   Problem List Items Addressed This Visit      Musculoskeletal and Integument   Cervical strain - Primary    Exam and hx most consistent with neck strain given otherwise normal exam. She has several pain medication so did not prescribe additional medicine. Advised Heat/ice. Also given hx highly recommended early PT, but patient preferred trial of home exercises first. She will call back if not improving          Return if symptoms worsen or fail to improve.  Lesleigh Noe, MD

## 2018-04-10 NOTE — Assessment & Plan Note (Signed)
Exam and hx most consistent with neck strain given otherwise normal exam. She has several pain medication so did not prescribe additional medicine. Advised Heat/ice. Also given hx highly recommended early PT, but patient preferred trial of home exercises first. She will call back if not improving

## 2018-04-10 NOTE — Patient Instructions (Signed)
Handout provided

## 2018-04-10 NOTE — Telephone Encounter (Signed)
Pt has fibromyalgic but pt has new pain that started 2 days ago; pt has pain in back of neck that goes up into head,also has pain in rt shoulder and rt thigh. No weakness in extremities. No dizziness and no CP. Pt scheduled appt with Dr Einar Pheasant today at 4 PM. If pt condition worsens prior to appt pt will go to [facility]. FYI to Dr Einar Pheasant.

## 2018-04-16 NOTE — Progress Notes (Signed)
Cardiology Office Note  Date:  04/23/2018   ID:  Lindsey Ward, Lindsey Ward 1950-05-24, MRN [id number]  PCP:  Venia Carbon, MD   Chief Complaint  Patient presents with  . New Patient (Initial Visit)    CP and SOB.Medications reviewed verbally with patient.     HPI:  [name] is a 68 y.o. woman with PMH of  Fibromyalgia Depression, chronic fatigue, chronic pain Hyperlipidemia Former smoker 40 yrs, quit about 5 years ago. Anxiety Referred by Dr. Silvio Pate for chest pain  INTERVAL HISTORY:  The patient reports today for an initial consultation.  She reports sudden increased heart rate for about 30 minutes with associated chest discomfort back in December 2019.  Stating this was maybe the third time this has happened in her life.  At the time of onset she was just waking up.  She is unaware how fast the heart rate was going, did not check with a blood pressure cuff  At baseline she does not normally sleep will, but adds there was nothing else out of the ordinary going on at that time.  Since that episode she has not had any issues.   Years ago she had a chemical stress test.   No regular exercise, limited by fibromyalgia, discomfort in her legs But able to walk from the car into the building, do her shopping without any significant chest discomfort or shortness of breath No complaints in the past 2 months since her chest discomfort  Overall, at this time patient feels good, adding she is at her baseline.   CT chest in 09/2017 Images pulled up reviewed in detail on todays visit That showed mild coronary calcification noted in the proximal left circumflex, proximal LAD and RCA She does have mild diffuse aortic atherosclerosis  EKG personally reviewed by myself on todays visit shows normal sinus rhythm, 59 bpm.   OTHER PAST MEDICAL HISTORY REVIEWED BY ME FOR TODAY'S VISIT: On visit with PMD 02/12/18, had dizziness, no energy, Orthostasis,  Some pain in left chest and down  arm--- since last week (when just sitting) Chronic nausea  Lab work reviewed with her in detail Total chol 231 and LDL 162 in 2017   PMH:   has a past medical history of Bowel obstruction ([place]), Chronic fatigue, Depression, Fibromyalgia, GERD (gastroesophageal reflux disease), Hyperlipemia, Migraine, Obstipation, Osteopenia, and PUD (peptic ulcer disease).  PSH:    Past Surgical History:  Procedure Laterality Date  . ABDOMINAL SURGERY    . APPENDECTOMY  2005  . BREAST BIOPSY Bilateral 1999   rt w/out clip - neg, lt w/clip - neg  . BREAST CYST ASPIRATION Right 2003   neg  . CHOLECYSTECTOMY  2004  . COLONOSCOPY WITH PROPOFOL N/A 06/09/2016   Procedure: COLONOSCOPY WITH PROPOFOL;  Surgeon: Jonathon Bellows, MD;  [facility] ENDOSCOPY;  Service: Endoscopy;  Laterality: N/A;  . ESOPHAGOGASTRODUODENOSCOPY (EGD) WITH PROPOFOL N/A 06/09/2016   Procedure: ESOPHAGOGASTRODUODENOSCOPY (EGD) WITH PROPOFOL;  Surgeon: Jonathon Bellows, MD;  Location: [facility] ENDOSCOPY;  Service: Endoscopy;  Laterality: N/A;  . EYE SURGERY    . GIVENS CAPSULE STUDY N/A 06/23/2016   Procedure: GIVENS CAPSULE STUDY;  Surgeon: Jonathon Bellows, MD;  Location: [facility] ENDOSCOPY;  Service: Endoscopy;  Laterality: N/A;  . PARS PLANA VITRECTOMY W/ REPAIR OF MACULAR HOLE Left 01/2017   [facility]  . TOTAL ABDOMINAL HYSTERECTOMY W/ BILATERAL SALPINGOOPHORECTOMY  1986    Current Outpatient Medications  Medication Sig Dispense Refill  . albuterol (PROVENTIL HFA;VENTOLIN HFA) 108 (90 Base)  MCG/ACT inhaler Inhale 2 puffs into the lungs every 6 (six) hours as needed for wheezing or shortness of breath. 1 Inhaler 0  . ALPRAZolam (XANAX) 1 MG tablet Take 1 tablet (1 mg total) by mouth 3 (three) times daily as needed. 90 tablet 0  . cetirizine (ZYRTEC) 10 MG tablet TAKE 1 TABLET BY MOUTH EVERY DAY 30 tablet 11  . HYDROcodone-acetaminophen (NORCO/VICODIN) 5-325 MG tablet Take 1 tablet by mouth 3 (three) times daily as needed. Dx Code M79.7 90 tablet 0  .  omeprazole (PRILOSEC) 20 MG capsule Take 1 capsule (20 mg total) by mouth 2 (two) times daily. 180 capsule 1  . promethazine (PHENERGAN) 12.5 MG tablet Take 1-2 tablets (12.5-25 mg total) by mouth 3 (three) times daily as needed for nausea or vomiting. 60 tablet 0  . Spacer/Aero-Holding Chambers DEVI Use spacer with Albuterol inhaler as directed. 1 each 0  . SUMAtriptan (IMITREX) 50 MG tablet TAKE 0.5-1 TABLET BY MOUTH DAILY AS NEEDED FOR MIGRAINE. 10 tablet 11   No current facility-administered medications for this visit.     Allergies:   Tizanidine; Aspirin; Buspirone hcl; Ciprofloxacin; Citalopram; Codeine; Diazepam; Duloxetine; Ibuprofen; Imipramine hcl; Oxycodone-acetaminophen; Pregabalin; Propoxyphene hcl; Risperidone; Sulfonamide derivatives; Tramadol hcl; Tramadol hcl; Venlafaxine; and Methocarbamol   Social History:  The patient  reports that she quit smoking about 4 years ago. Her smoking use included cigarettes. She has a 40.00 pack-year smoking history. She has never used smokeless tobacco. She reports previous alcohol use. She reports that she does not use drugs.   Family History:   family history includes Breast cancer in her sister; Diabetes in her sister; Heart disease in her father, mother, and sister; Hypertension in her sister and sister; Stroke in her mother.    Review of Systems: Review of Systems  Constitutional: Negative.   Respiratory: Negative.  Negative for shortness of breath.   Cardiovascular: Positive for chest pain and palpitations.  Gastrointestinal: Negative.   Musculoskeletal: Negative.        Neck discomfort  Neurological: Negative.   Psychiatric/Behavioral: Negative.   All other systems reviewed and are negative.    PHYSICAL EXAM: VS:  BP 114/70 (BP Location: Right Arm, Patient Position: Sitting, Cuff Size: Normal)   Pulse (!) 59   Ht 5\' 3"  (1.6 m)   Wt 114 lb (51.7 kg)   BMI 20.19 kg/m  , BMI Body mass index is 20.19 kg/m.   GEN: Well  nourished, well developed, in no acute distress  HEENT: normal  Neck: no JVD, carotid bruits, or masses Cardiac: RRR; no murmurs, rubs, or gallops,no edema  Respiratory:  clear to auscultation bilaterally, normal work of breathing GI: soft, nontender, nondistended, + BS MS: no deformity or atrophy  Skin: warm and dry, no rash Neuro:  Strength and sensation are intact Psych: euthymic mood, full affect   Recent Labs: 02/13/2018: ALT 18; BUN 19; Creatinine, Ser 0.82; Hemoglobin 13.3; Platelets 318.0; Potassium 4.1; Sodium 135    Lipid Panel Lab Results  Component Value Date   CHOL 231 (H) 03/08/2016   HDL 42.40 03/08/2016   LDLCALC 162 (H) 03/08/2016   TRIG 129.0 03/08/2016      Wt Readings from Last 3 Encounters:  04/23/18 114 lb (51.7 kg)  04/10/18 114 lb 8 oz (51.9 kg)  03/22/18 114 lb (51.7 kg)       ASSESSMENT AND PLAN:  Chest pain, unspecified type Plan: Improved, appears to happen in the setting of tachycardia lasting 30 minutes back in  December 2019 She denies any further episodes since that time, less likely from ischemia, possibly secondary to heart arrhythmia This was discussed with her in detail, recommended a event monitor if she has recurrent arrhythmia.  She denies any in the past 2 months Also recommended pharmacologic Myoview if she has worsening chest discomfort especially with exertion She is declining testing at this time as she feels well  Fibromyalgia Plan: Stable Muscle ache, recommended massage, walking program, water therapy  Episodic mood disorder (HCC) Plan: Anxiety, uncontrol  Long discussion concerning whether her anxiety could cause tachycardia Unable to say whether her panic attacks could contribute to palpitations If symptoms get worse would order event monitor  Mixed hyperlipidemia Plan: Above goal Recommend taking Zetia  Discussed the medication, should not cause muscle ache Given her coronary calcification and aortic  atherosclerosis would recommend we try to work on her cholesterol  Aortic atherosclerosis At least mild, mild to moderate in the aortic arch Stressed importance of cholesterol modification Images pulled up and discussed with her in the office  Disposition:   F/U  PRN She will call us for any further chest discomfort or tachycardia  Total encounter time more than 60 minutes. Greater than 50% was spent in counseling and coordination of care with the patient.   I, Margit Banda am acting as a scribe for Ida Rogue, M.D., Ph.D.  I have reviewed the above documentation for accuracy and completeness, and I agree with the above.  Signed, Esmond Plants, M.D., Ph.D. 04/23/2018  [place], [place]

## 2018-04-22 NOTE — Patient Instructions (Addendum)
Medication Instructions:   Please start zetia 10 mg- take one tablet by mouth once a day for cholesterol  If you need a refill on your cardiac medications before your next appointment, please call your pharmacy.    Lab work: No new labs needed   If you have labs (blood work) drawn today and your tests are completely normal, you will receive your results only by:  [place] (if you have MyChart) OR  A paper copy in the mail If you have any lab test that is abnormal or we need to change your treatment, we will call you to review the results.   Testing/Procedures: No new testing needed  If you have chest pain, call the office We could order a medicine stress test  If you have more fluttering/tachycardia, Call the office We could order a monitor (Zio monitor, 2 weeks)   Follow-Up: At [facility], you and your health needs are our priority.  As part of our continuing mission to provide you with exceptional heart care, we have created designated Provider Care Teams.  These Care Teams include your primary Cardiologist (physician) and Advanced Practice Providers (APPs -  Physician Assistants and Nurse Practitioners) who all work together to provide you with the care you need, when you need it.   You will need a follow up appointment as needed   Providers on your designated Care Team:    Murray Hodgkins, NP  Christell Faith, PA-C  Marrianne Mood, PA-C  Any Other Special Instructions Will Be Listed Below (If Applicable).  For educational health videos Log in to : [email or web address] Or : [email or web address], password : [email or web address]

## 2018-04-23 ENCOUNTER — Ambulatory Visit: Payer: Medicare HMO | Admitting: Cardiovascular Disease

## 2018-04-23 ENCOUNTER — Encounter: Payer: Self-pay | Admitting: Cardiovascular Disease

## 2018-04-23 VITALS — BP 114/70 | HR 59 | Ht 63.0 in | Wt 114.0 lb

## 2018-04-23 DIAGNOSIS — I700 Atherosclerosis of aorta: Secondary | ICD-10-CM | POA: Insufficient documentation

## 2018-04-23 DIAGNOSIS — F39 Unspecified mood [affective] disorder: Secondary | ICD-10-CM

## 2018-04-23 DIAGNOSIS — E782 Mixed hyperlipidemia: Secondary | ICD-10-CM | POA: Diagnosis not present

## 2018-04-23 DIAGNOSIS — M797 Fibromyalgia: Secondary | ICD-10-CM

## 2018-04-23 DIAGNOSIS — R079 Chest pain, unspecified: Secondary | ICD-10-CM

## 2018-04-23 DIAGNOSIS — I2510 Atherosclerotic heart disease of native coronary artery without angina pectoris: Secondary | ICD-10-CM | POA: Diagnosis not present

## 2018-04-23 MED ORDER — EZETIMIBE 10 MG PO TABS: 10.0000 mg | ORAL_TABLET | Freq: Every day | ORAL | 3 refills | Status: DC

## 2018-04-24 NOTE — Telephone Encounter (Signed)
Pt was seen on 04/10/18.

## 2018-04-27 ENCOUNTER — Telehealth: Payer: Self-pay | Admitting: Cardiovascular Disease

## 2018-04-27 ENCOUNTER — Telehealth: Payer: Self-pay

## 2018-04-27 NOTE — Telephone Encounter (Signed)
Pt c/o medication issue:  1. Name of Medication: Zetia  2. How are you currently taking this medication (dosage and times per day)? 10 mg po qhs  3. Are you having a reaction (difficulty breathing--STAT)? Tongue swelling and blisters on / in mouth lips denies blocked airway or sob   4. What is your medication issue? Patient thinks she will be unable to tolerate this new medication

## 2018-04-27 NOTE — Telephone Encounter (Signed)
Pt request last cholesterol lab result; advised pt cholesterol was 231 on 03/08/16. Pt voiced understanding and that was all needed.

## 2018-04-27 NOTE — Telephone Encounter (Signed)
Spoke with patient and said that yesterday her tongue was swelling with tingling and itching on both lower legs. She also had a blister to come on her lip. She has been on the zetia for 3 days started it on 04/24/18. Instructed her to stop taking the zetia and see if her symptoms improve and I would make provider aware for any further recommendations. Advised that if her symptoms resolve then she should not restart this medication and if they persist then she may want to check with her primary provider. Instructed her to contact her PCP or go to ED if her symptoms persist or worsen over the weekend. She verbalized understanding of our conversation, agreement with plan, and had no further questions at this time.

## 2018-04-29 NOTE — Telephone Encounter (Signed)
She can start a statin instead. Dr. Silvio Pate can also discuss with her in follow up

## 2018-04-30 ENCOUNTER — Telehealth: Payer: Self-pay | Admitting: *Deleted

## 2018-04-30 NOTE — Telephone Encounter (Signed)
Spoke to pt. She had stopped it because she had an issue with a rash and swollen tongue. She said she will not start the Zetia. Dr Donivan Scull nurse had advised her to ask PCP if she should try a different cholesterol med. Pt said she will wait until her next visit with Dr Silvio Pate to discuss options.

## 2018-04-30 NOTE — Telephone Encounter (Signed)
Spoke with patient. She says her lip blister has improved and still has some itching on her legs. Overall, she feels better.  She agreed to discuss with Dr Silvio Pate further management of cholesterol.

## 2018-04-30 NOTE — Telephone Encounter (Signed)
Patient called stating that the nurse from the cardiologist office Dr. Donivan Scull office called her telling her that she needs to get some lab work done to start some cholesterol medication. No orders for lab in [place]. See phone notes.from Dr. Althea Charon office. Patient wants to know if she needs to see you or have lab work done?

## 2018-04-30 NOTE — Telephone Encounter (Signed)
Please let her know that I don't see any notation about checking labs prior to starting zetia (and I have never heard of that being necessary). It would be fine for her to just start the prescription and we can check her cholesterol at her next visit

## 2018-05-07 ENCOUNTER — Telehealth: Payer: Self-pay | Admitting: *Deleted

## 2018-05-07 NOTE — Telephone Encounter (Signed)
Patient left a voicemail stating that she has had this problem before and wants to know if she should be concerned?  Patient stated that her navel is leaking, has a bad smell and a big red circle around it. Patient wants to know if she should be concerned?

## 2018-05-07 NOTE — Telephone Encounter (Signed)
Spoke to pt

## 2018-05-07 NOTE — Telephone Encounter (Signed)
There can occasionally be infections in the navel I would recommend she clean it out well and try warm compresses. If it worsens, she will need to be checked

## 2018-05-21 DIAGNOSIS — H52223 Regular astigmatism, bilateral: Secondary | ICD-10-CM | POA: Diagnosis not present

## 2018-05-22 ENCOUNTER — Other Ambulatory Visit: Payer: Self-pay | Admitting: Internal Medicine

## 2018-05-22 NOTE — Telephone Encounter (Signed)
Last filled 03-22-18 #90 Last OV Acute 04-10-18 Next OV 09-20-18 [facility]

## 2018-05-25 ENCOUNTER — Telehealth: Payer: Self-pay

## 2018-05-25 DIAGNOSIS — R928 Other abnormal and inconclusive findings on diagnostic imaging of breast: Secondary | ICD-10-CM

## 2018-05-25 NOTE — Telephone Encounter (Signed)
Pt left v/m she received a letter that [facility] wanting pt to schedule breast xray. Pt request that [facility] scheduled appt after 06/21/18 when pts ins will pay.

## 2018-05-28 NOTE — Telephone Encounter (Signed)
Forwarding to Shirlean Mylar to let her know these need to be after 07-03-18.

## 2018-05-28 NOTE — Telephone Encounter (Signed)
Orders placed.

## 2018-05-28 NOTE — Telephone Encounter (Signed)
Bilateral diagnostic mammogram with RIGHT breast ultrasound in 6 Months per last report. After 07-03-18 because the last one was done 01-01-18.

## 2018-06-12 ENCOUNTER — Encounter: Payer: Self-pay | Admitting: Internal Medicine

## 2018-06-12 ENCOUNTER — Other Ambulatory Visit: Payer: Self-pay

## 2018-06-12 ENCOUNTER — Ambulatory Visit (INDEPENDENT_AMBULATORY_CARE_PROVIDER_SITE_OTHER): Payer: Medicare HMO | Admitting: Internal Medicine

## 2018-06-12 DIAGNOSIS — B349 Viral infection, unspecified: Secondary | ICD-10-CM | POA: Insufficient documentation

## 2018-06-12 NOTE — Assessment & Plan Note (Signed)
Has myalgia, chills and runny nose No sig cough or dyspnea Had brief palpitations--no other heart symptoms  Discussed---could be early COVID Discussed acetaminophen, rest and social isolation (for at least 7 days) If she worsens, she will let me know (we may have respiratory clinics set up in the near future) 911 if significant dyspnea, etc

## 2018-06-12 NOTE — Progress Notes (Signed)
Subjective:    Patient ID: Lindsey Ward, female    DOB: 1950/11/29, 68 y.o.   MRN: [id number]  HPI Virtual Visit via Telephone Note  I connected with Clayborn Bigness on 06/12/18 at 12:00 PM EDT by telephone and verified that I am speaking with the correct person using two identifiers.   I discussed the limitations, risks, security and privacy concerns of performing an evaluation and management service by telephone and the availability of in person appointments. I also discussed with the patient that there may be a patient responsible charge related to this service. The patient expressed understanding and agreed to proceed.  Patient does not have access to video technology  History of Present Illness: Patient is in home  I am in my office  Started with "my heart is pumping too fast" Off and on Did check pulse while we were talking---66 No chest pain  Did have some runny nose Was supposed to have someone come and help hang curtains--canceled No cough No clear fever---but feels cold Some muscle aching No SOB Did take some phenergan for nausea this AM   Fibromyalgia pain kicking in---not sure about taking her pain medicine Didn't sleep well last night--only about 5 hours last night Hasn't been out of the area  Current Outpatient Medications on File Prior to Visit  Medication Sig Dispense Refill  . ALPRAZolam (XANAX) 1 MG tablet TAKE 1 TABLET (1 MG TOTAL) BY MOUTH 3 (THREE) TIMES DAILY AS NEEDED. 90 tablet 0  . cetirizine (ZYRTEC) 10 MG tablet TAKE 1 TABLET BY MOUTH EVERY DAY 30 tablet 11  . ezetimibe (ZETIA) 10 MG tablet Take 1 tablet (10 mg total) by mouth daily. 90 tablet 3  . HYDROcodone-acetaminophen (NORCO/VICODIN) 5-325 MG tablet Take 1 tablet by mouth 3 (three) times daily as needed. Dx Code M79.7 90 tablet 0  . omeprazole (PRILOSEC) 20 MG capsule Take 1 capsule (20 mg total) by mouth 2 (two) times daily. 180 capsule 1  . promethazine (PHENERGAN) 12.5 MG tablet Take 1-2  tablets (12.5-25 mg total) by mouth 3 (three) times daily as needed for nausea or vomiting. 60 tablet 0  . SUMAtriptan (IMITREX) 50 MG tablet TAKE 0.5-1 TABLET BY MOUTH DAILY AS NEEDED FOR MIGRAINE. 10 tablet 11   No current facility-administered medications on file prior to visit.     Allergies  Allergen Reactions  . Tizanidine Other (See Comments)    "throws me in a different world"  . Aspirin     REACTION: GI bleed  . Buspirone Hcl     REACTION: unspecified  . Ciprofloxacin     REACTION: unspecified  . Citalopram Other (See Comments)    Ear ringing.   . Codeine   . Diazepam   . Duloxetine     REACTION: sz like activity  . Ibuprofen     REACTION: GI bleed  . Imipramine Hcl Other (See Comments)    Ear ringing.   . Oxycodone-Acetaminophen     REACTION: unspecified  . Pregabalin     REACTION: kept her up and confusion  . Propoxyphene Hcl   . Risperidone     REACTION: confusion  . Sulfonamide Derivatives     REACTION: unspecified  . Tramadol Hcl     REACTION: doesn't remember what happened but couldn't tolerate  . Tramadol Hcl Other (See Comments)    REACTION: doesn't remember what happened but couldn't tolerate REACTION: doesn't remember what happened but couldn't tolerate REACTION: doesn't remember what happened but  couldn't tolerate   . Venlafaxine     REACTION: unspecified  . Methocarbamol Rash    Past Medical History:  Diagnosis Date  . Bowel obstruction ([place])   . Chronic fatigue   . Depression   . Fibromyalgia   . GERD (gastroesophageal reflux disease)   . Hyperlipemia   . Migraine   . Obstipation   . Osteopenia   . PUD (peptic ulcer disease)     Past Surgical History:  Procedure Laterality Date  . ABDOMINAL SURGERY    . APPENDECTOMY  2005  . BREAST BIOPSY Bilateral 1999   rt w/out clip - neg, lt w/clip - neg  . BREAST CYST ASPIRATION Right 2003   neg  . CHOLECYSTECTOMY  2004  . COLONOSCOPY WITH PROPOFOL N/A 06/09/2016   Procedure: COLONOSCOPY  WITH PROPOFOL;  Surgeon: Jonathon Bellows, MD;  Location: [facility] ENDOSCOPY;  Service: Endoscopy;  Laterality: N/A;  . ESOPHAGOGASTRODUODENOSCOPY (EGD) WITH PROPOFOL N/A 06/09/2016   Procedure: ESOPHAGOGASTRODUODENOSCOPY (EGD) WITH PROPOFOL;  Surgeon: Jonathon Bellows, MD;  Location: [facility] ENDOSCOPY;  Service: Endoscopy;  Laterality: N/A;  . EYE SURGERY    . GIVENS CAPSULE STUDY N/A 06/23/2016   Procedure: GIVENS CAPSULE STUDY;  Surgeon: Jonathon Bellows, MD;  Location: [facility] ENDOSCOPY;  Service: Endoscopy;  Laterality: N/A;  . PARS PLANA VITRECTOMY W/ REPAIR OF MACULAR HOLE Left 01/2017   [facility]  . TOTAL ABDOMINAL HYSTERECTOMY W/ BILATERAL SALPINGOOPHORECTOMY  1986    Family History  Problem Relation Age of Onset  . Heart disease Mother        AMI  . Stroke Mother   . Heart disease Father   . Hypertension Sister   . Breast cancer Sister   . Hypertension Sister   . Heart disease Sister        MI  . Diabetes Sister   . Kidney cancer Neg Hx   . Kidney disease Neg Hx   . Prostate cancer Neg Hx     Social History   Socioeconomic History  . Marital status: Single    Spouse name: Not on file  . Number of children: 1  . Years of education: Not on file  . Highest education level: Not on file  Occupational History  . Occupation: DISABLED  Social Needs  . Financial resource strain: Not on file  . Food insecurity:    Worry: Not on file    Inability: Not on file  . Transportation needs:    Medical: Not on file    Non-medical: Not on file  Tobacco Use  . Smoking status: Former Smoker    Packs/day: 1.00    Years: 40.00    Pack years: 40.00    Types: Cigarettes    Last attempt to quit: 09/25/2013    Years since quitting: 4.7  . Smokeless tobacco: Never Used  . Tobacco comment:    Substance and Sexual Activity  . Alcohol use: Not Currently    Alcohol/week: 0.0 standard drinks    Comment: rarely  . Drug use: No  . Sexual activity: Not on file  Lifestyle  . Physical activity:    Days per week: Not  on file    Minutes per session: Not on file  . Stress: Not on file  Relationships  . Social connections:    Talks on phone: Not on file    Gets together: Not on file    Attends religious service: Not on file    Active member of club or organization: Not on file  Attends meetings of clubs or organizations: Not on file    Relationship status: Not on file  . Intimate partner violence:    Fear of current or ex partner: Not on file    Emotionally abused: Not on file    Physically abused: Not on file    Forced sexual activity: Not on file  Other Topics Concern  . Not on file  Social History Narrative   Living alone again      No living will   Daughter should make health care decisions   Requests DNR---done 10/24/14   No tube feeds if cognitively unaware   Highest level of education: 9th   Observations/Objective: No distress No dyspnea  Assessment and Plan: Viral syndrome ---see problem list  Follow Up Instructions:    I discussed the assessment and treatment plan with the patient. The patient was provided an opportunity to ask questions and all were answered. The patient agreed with the plan and demonstrated an understanding of the instructions.   The patient was advised to call back or seek an in-person evaluation if the symptoms worsen or if the condition fails to improve as anticipated.  I provided 10 minutes of non-face-to-face time during this encounter.   Viviana Simpler, MD    Review of Systems     Objective:   Physical Exam         Assessment & Plan:

## 2018-06-18 ENCOUNTER — Other Ambulatory Visit: Payer: Medicare HMO

## 2018-06-18 ENCOUNTER — Other Ambulatory Visit: Payer: Self-pay | Admitting: *Deleted

## 2018-06-18 ENCOUNTER — Ambulatory Visit: Payer: Medicare HMO

## 2018-06-18 ENCOUNTER — Ambulatory Visit: Payer: Medicare HMO | Admitting: Oncology

## 2018-06-18 MED ORDER — HYDROCODONE-ACETAMINOPHEN 5-325 MG PO TABS: 1.0000 | ORAL_TABLET | Freq: Three times a day (TID) | ORAL | 0 refills | Status: DC | PRN

## 2018-06-18 NOTE — Telephone Encounter (Signed)
Patient left a voicemail requesting a refill on her Hydrocodone. Name of Medication: Hydrocodone [facility] of [facility] or Written Date and Quantity: 03/23/18 #90 Last Office Visit and Type: 06/12/18  acute Next Office Visit and Type: 09/20/18 6 month follow-up Last Controlled Substance Agreement Date: 03/22/18 Last UDS:03/22/18

## 2018-07-05 ENCOUNTER — Other Ambulatory Visit: Payer: Medicare HMO

## 2018-07-09 ENCOUNTER — Other Ambulatory Visit: Payer: Medicare HMO

## 2018-07-09 ENCOUNTER — Ambulatory Visit: Payer: Medicare HMO

## 2018-07-23 ENCOUNTER — Other Ambulatory Visit: Payer: Medicare HMO

## 2018-07-27 ENCOUNTER — Ambulatory Visit: Payer: Medicare HMO

## 2018-07-27 ENCOUNTER — Ambulatory Visit: Payer: Medicare HMO | Admitting: Oncology

## 2018-07-27 ENCOUNTER — Other Ambulatory Visit: Payer: Medicare HMO

## 2018-07-30 ENCOUNTER — Ambulatory Visit
Admission: RE | Admit: 2018-07-30 | Discharge: 2018-07-30 | Disposition: A | Discharge status: Home / Self Care | Payer: Medicare HMO | Source: Ambulatory Visit | Attending: Internal Medicine | Admitting: Internal Medicine

## 2018-07-30 ENCOUNTER — Other Ambulatory Visit: Payer: Self-pay

## 2018-07-30 DIAGNOSIS — N6311 Unspecified lump in the right breast, upper outer quadrant: Secondary | ICD-10-CM | POA: Diagnosis not present

## 2018-07-30 DIAGNOSIS — R922 Inconclusive mammogram: Secondary | ICD-10-CM | POA: Diagnosis not present

## 2018-07-30 DIAGNOSIS — N6312 Unspecified lump in the right breast, upper inner quadrant: Secondary | ICD-10-CM | POA: Diagnosis not present

## 2018-07-30 DIAGNOSIS — R928 Other abnormal and inconclusive findings on diagnostic imaging of breast: Secondary | ICD-10-CM

## 2018-08-08 ENCOUNTER — Other Ambulatory Visit: Payer: Self-pay | Admitting: Internal Medicine

## 2018-08-08 NOTE — Telephone Encounter (Signed)
Last filled 05-22-18 #90 Last OV 05-22-18 Next OV 09-20-18 [facility]

## 2018-08-14 ENCOUNTER — Other Ambulatory Visit: Payer: Self-pay | Admitting: Internal Medicine

## 2018-08-14 NOTE — Telephone Encounter (Signed)
Name of Medication: Hydrocodone Name of Pharmacy: [place] or Written Date and Quantity: 06-18-18 #90 Last Office Visit and Type: 06-12-18 Next Office Visit and Type: 09-20-18 Last Controlled Substance Agreement Date: 03-22-18 Last UDS: 03-22-18

## 2018-08-14 NOTE — Telephone Encounter (Signed)
Best number [phone] Pt called to get a [facility]ill [facility]one [facility]   [facility]   Pt stated her body hurts really bad from neck to toes and she wants to know what she needs to do for this.. she stated sometimes  Hydrocodone helps some times it doesn't/

## 2018-08-15 MED ORDER — HYDROCODONE-ACETAMINOPHEN 5-325 MG PO TABS: 1.0000 | ORAL_TABLET | Freq: Three times a day (TID) | ORAL | 0 refills | Status: DC | PRN

## 2018-08-24 ENCOUNTER — Telehealth: Payer: Self-pay

## 2018-08-24 NOTE — Telephone Encounter (Signed)
Pt called triage line saying she is having terrible back pain. Was asking about being referred to someone who could do injections in her back. She is already established with Dr Arnoldo Morale at [facility] She will call their office to see if she can get in with him. If not, she will come see Dr Silvio Pate to do a referral.

## 2018-08-24 NOTE — Telephone Encounter (Signed)
Patient states she called Dr.Jenkins and he advised she request an MRI from PCP. Please advise. Call back is [phone].

## 2018-08-27 NOTE — Telephone Encounter (Signed)
Probably would be better to be in house---for a thorough exam

## 2018-08-27 NOTE — Telephone Encounter (Signed)
Do you want me to set her up for a Doxy, Call, or In Office Visit to discuss MRI options

## 2018-08-28 NOTE — Telephone Encounter (Signed)
Spoke to pt. She said Dr Arnoldo Morale told her he would order the MRI for her.

## 2018-09-04 DIAGNOSIS — M542 Cervicalgia: Secondary | ICD-10-CM | POA: Diagnosis not present

## 2018-09-20 ENCOUNTER — Ambulatory Visit: Payer: Medicare HMO | Admitting: Internal Medicine

## 2018-10-01 ENCOUNTER — Other Ambulatory Visit: Payer: Self-pay | Admitting: Internal Medicine

## 2018-10-01 NOTE — Telephone Encounter (Signed)
Last filled 08-08-18 #90 Last OV 06-12-18 Acute Next OV 03-25-19 [facility]

## 2018-10-09 ENCOUNTER — Other Ambulatory Visit: Payer: Self-pay | Admitting: *Deleted

## 2018-10-09 MED ORDER — HYDROCODONE-ACETAMINOPHEN 5-325 MG PO TABS: 1.0000 | ORAL_TABLET | Freq: Three times a day (TID) | ORAL | 0 refills | Status: DC | PRN

## 2018-10-09 NOTE — Telephone Encounter (Signed)
Patient left a voicemail requesting a refill on Hydrocodone  Name of Medication: Hydrocodone [facility] Written Date and Quantity:08/15/18 #90  Last Office Visit and Type: 06/02/18 acute Next Office Visit and Type: 03/25/19 CPE Last Controlled Substance Agreement Date: 03/22/18 Last UDS: 03/22/18

## 2018-10-20 NOTE — Progress Notes (Signed)
[place]  [phone] [phone]) [phone]  ID: Lindsey Ward OB: 26-Feb-1951  MR#: [id number]  PJA#:[id number]  Patient Care Team: Venia Carbon, MD as PCP - General  CHIEF COMPLAINT: Iron deficiency anemia  INTERVAL HISTORY: Patient returns to clinic today for repeat laboratory work and further evaluation.  She is noticed increased weakness and fatigue over the past several weeks, but otherwise feels well.  She does not complain of pain today.  She has no neurologic complaints. She denies any recent fevers or illnesses. She has a good appetite and denies weight loss.  She denies any chest pain, shortness of breath, cough, or hemoptysis.  She denies any nausea, vomiting, constipation, or diarrhea. She denies any melena or hematochezia. She has no urinary complaints.  Patient offers no further specific complaints today.  REVIEW OF SYSTEMS:   Review of Systems  Constitutional: Positive for malaise/fatigue. Negative for fever and weight loss.  Respiratory: Negative.  Negative for cough.   Cardiovascular: Negative.  Negative for chest pain and leg swelling.  Gastrointestinal: Negative.  Negative for abdominal pain, blood in stool and melena.  Genitourinary: Negative.  Negative for hematuria.  Musculoskeletal: Positive for myalgias.  Skin: Negative.  Negative for rash.  Neurological: Negative.  Negative for focal weakness, weakness and headaches.  Psychiatric/Behavioral: Negative.  The patient is not nervous/anxious.     As per HPI. Otherwise, a complete review of systems is negative.  PAST MEDICAL HISTORY: Past Medical History:  Diagnosis Date  . Bowel obstruction ([place])   . Chronic fatigue   . Depression   . Fibromyalgia   . GERD (gastroesophageal reflux disease)   . Hyperlipemia   . Migraine   . Obstipation   . Osteopenia   . PUD (peptic ulcer disease)     PAST SURGICAL HISTORY: Past Surgical History:  Procedure Laterality Date  .  ABDOMINAL SURGERY    . APPENDECTOMY  2005  . BREAST BIOPSY Bilateral 1999   rt w/out clip - neg, lt w/clip - neg  . BREAST CYST ASPIRATION Right 2003   neg  . CHOLECYSTECTOMY  2004  . COLONOSCOPY WITH PROPOFOL N/A 06/09/2016   Procedure: COLONOSCOPY WITH PROPOFOL;  Surgeon: Jonathon Bellows, MD;  Location: [facility] ENDOSCOPY;  Service: Endoscopy;  Laterality: N/A;  . ESOPHAGOGASTRODUODENOSCOPY (EGD) WITH PROPOFOL N/A 06/09/2016   Procedure: ESOPHAGOGASTRODUODENOSCOPY (EGD) WITH PROPOFOL;  Surgeon: Jonathon Bellows, MD;  Location: [facility] ENDOSCOPY;  Service: Endoscopy;  Laterality: N/A;  . EYE SURGERY    . GIVENS CAPSULE STUDY N/A 06/23/2016   Procedure: GIVENS CAPSULE STUDY;  Surgeon: Jonathon Bellows, MD;  Location: [facility] ENDOSCOPY;  Service: Endoscopy;  Laterality: N/A;  . PARS PLANA VITRECTOMY W/ REPAIR OF MACULAR HOLE Left 01/2017   [facility]  . TOTAL ABDOMINAL HYSTERECTOMY W/ BILATERAL SALPINGOOPHORECTOMY  1986    FAMILY HISTORY: Family History  Problem Relation Age of Onset  . Heart disease Mother        AMI  . Stroke Mother   . Heart disease Father   . Hypertension Sister   . Breast cancer Sister   . Hypertension Sister   . Heart disease Sister        MI  . Diabetes Sister   . Kidney cancer Neg Hx   . Kidney disease Neg Hx   . Prostate cancer Neg Hx     ADVANCED DIRECTIVES (Y/N):  N  HEALTH MAINTENANCE: Social History   Tobacco Use  . Smoking status: Former Smoker    Packs/day: 1.00  Years: 40.00    Pack years: 40.00    Types: Cigarettes    Quit date: 09/25/2013    Years since quitting: 5.0  . Smokeless tobacco: Never Used  . Tobacco comment:    Substance Use Topics  . Alcohol use: Not Currently    Alcohol/week: 0.0 standard drinks    Comment: rarely  . Drug use: No     Colonoscopy:  PAP:  Bone density:  Lipid panel:  Allergies  Allergen Reactions  . Tizanidine Other (See Comments)    "throws me in a different world"  . Aspirin     REACTION: GI bleed  . Buspirone Hcl      REACTION: unspecified  . Ciprofloxacin     REACTION: unspecified  . Citalopram Other (See Comments)    Ear ringing.   . Codeine   . Diazepam   . Duloxetine     REACTION: sz like activity  . Ibuprofen     REACTION: GI bleed  . Imipramine Hcl Other (See Comments)    Ear ringing.   . Oxycodone-Acetaminophen     REACTION: unspecified  . Pregabalin     REACTION: kept her up and confusion  . Propoxyphene Hcl   . Risperidone     REACTION: confusion  . Sulfonamide Derivatives     REACTION: unspecified  . Tramadol Hcl     REACTION: doesn't remember what happened but couldn't tolerate  . Tramadol Hcl Other (See Comments)    REACTION: doesn't remember what happened but couldn't tolerate REACTION: doesn't remember what happened but couldn't tolerate REACTION: doesn't remember what happened but couldn't tolerate   . Venlafaxine     REACTION: unspecified  . Methocarbamol Rash    Current Outpatient Medications  Medication Sig Dispense Refill  . ALPRAZolam (XANAX) 1 MG tablet TAKE 1 TABLET (1 MG TOTAL) BY MOUTH 3 (THREE) TIMES DAILY AS NEEDED. 90 tablet 0  . HYDROcodone-acetaminophen (NORCO/VICODIN) 5-325 MG tablet Take 1 tablet by mouth 3 (three) times daily as needed. Dx Code M79.7 90 tablet 0  . omeprazole (PRILOSEC) 20 MG capsule Take 1 capsule (20 mg total) by mouth 2 (two) times daily. 180 capsule 1  . promethazine (PHENERGAN) 12.5 MG tablet Take 1-2 tablets (12.5-25 mg total) by mouth 3 (three) times daily as needed for nausea or vomiting. 60 tablet 0  . SUMAtriptan (IMITREX) 50 MG tablet TAKE 0.5-1 TABLET BY MOUTH DAILY AS NEEDED FOR MIGRAINE. 10 tablet 11  . cetirizine (ZYRTEC) 10 MG tablet TAKE 1 TABLET BY MOUTH EVERY DAY (Patient not taking: Reported on 10/26/2018) 30 tablet 11  . ezetimibe (ZETIA) 10 MG tablet Take 1 tablet (10 mg total) by mouth daily. 90 tablet 3   No current facility-administered medications for this visit.    Facility-Administered Medications Ordered in  Other Visits  Medication Dose Route Frequency Provider Last Rate Last Dose  . 0.9 %  sodium chloride infusion   Intravenous Continuous Lloyd Huger, MD   Stopped at 10/26/18 1503    OBJECTIVE: Vitals:   10/26/18 1313  BP: 120/62  Pulse: (!) 57  Resp: 16  Temp: 97.7 F (36.5 C)     Body mass index is 20.25 kg/m.    ECOG FS:0 - Asymptomatic  General: Well-developed, well-nourished, no acute distress. Eyes: Pink conjunctiva, anicteric sclera. HEENT: Normocephalic, moist mucous membranes. Lungs: Clear to auscultation bilaterally. Heart: Regular rate and rhythm. No rubs, murmurs, or gallops. Abdomen: Soft, nontender, nondistended. No organomegaly noted, normoactive bowel sounds. Musculoskeletal: No  edema, cyanosis, or clubbing. Neuro: Alert, answering all questions appropriately. Cranial nerves grossly intact. Skin: No rashes or petechiae noted. Psych: Normal affect.   LAB RESULTS:  Lab Results  Component Value Date   NA 135 02/13/2018   K 4.1 02/13/2018   CL 100 02/13/2018   CO2 28 02/13/2018   GLUCOSE 106 (H) 02/13/2018   BUN 19 02/13/2018   CREATININE 0.82 02/13/2018   CALCIUM 9.9 02/13/2018   PROT 7.4 02/13/2018   ALBUMIN 4.4 02/13/2018   AST 30 02/13/2018   ALT 18 02/13/2018   ALKPHOS 95 02/13/2018   BILITOT 0.4 02/13/2018   GFRNONAA >60 10/05/2017   GFRAA >60 10/05/2017    Lab Results  Component Value Date   WBC 5.8 10/26/2018   NEUTROABS 3.8 10/26/2018   HGB 10.2 (L) 10/26/2018   HCT 31.3 (L) 10/26/2018   MCV 87.2 10/26/2018   PLT 292 10/26/2018   Lab Results  Component Value Date   IRON 39 10/26/2018   TIBC 390 10/26/2018   IRONPCTSAT 10 (L) 10/26/2018   Lab Results  Component Value Date   FERRITIN 7 (L) 10/26/2018     STUDIES: No results found.  ASSESSMENT: Iron deficiency anemia.  PLAN:  1. Iron deficiency anemia: Patient underwent colonoscopy and upper endoscopy on May 20, 2016 that did not reveal a distinct etiology.  Video  endoscopy on July 05, 2017 revealed a possible site of bleeding and an ulceration in her small intestine.  Patient's hemoglobin and iron stores have trended down and she is symptomatic.  Proceed with 510 mg IV Feraheme today.  Return to clinic in 1 week for second infusion.  Patient will then return to clinic in 3 months with repeat laboratory work, further evaluation, and continuation of treatment if necessary.    I spent a total of 30 minutes face-to-face with the patient of which greater than 50% of the visit was spent in counseling and coordination of care as detailed above.   Patient expressed understanding and was in agreement with this plan. She also understands that She can call clinic at any time with any questions, concerns, or complaints.    Lloyd Huger, MD   10/26/2018 5:07 PM

## 2018-10-24 ENCOUNTER — Other Ambulatory Visit: Payer: Self-pay

## 2018-10-24 DIAGNOSIS — D509 Iron deficiency anemia, unspecified: Secondary | ICD-10-CM

## 2018-10-26 ENCOUNTER — Encounter: Payer: Self-pay | Admitting: Oncology

## 2018-10-26 ENCOUNTER — Encounter (INDEPENDENT_AMBULATORY_CARE_PROVIDER_SITE_OTHER): Payer: Self-pay

## 2018-10-26 ENCOUNTER — Inpatient Hospital Stay (HOSPITAL_BASED_OUTPATIENT_CLINIC_OR_DEPARTMENT_OTHER): Payer: Medicare HMO | Admitting: Oncology

## 2018-10-26 ENCOUNTER — Inpatient Hospital Stay: Payer: Medicare HMO | Attending: Oncology

## 2018-10-26 ENCOUNTER — Other Ambulatory Visit: Payer: Self-pay

## 2018-10-26 ENCOUNTER — Inpatient Hospital Stay: Payer: Medicare HMO

## 2018-10-26 VITALS — BP 120/62 | HR 57 | Temp 97.7°F | Resp 16 | Wt 114.3 lb

## 2018-10-26 DIAGNOSIS — D508 Other iron deficiency anemias: Secondary | ICD-10-CM

## 2018-10-26 DIAGNOSIS — D509 Iron deficiency anemia, unspecified: Secondary | ICD-10-CM | POA: Insufficient documentation

## 2018-10-26 LAB — CBC WITH DIFFERENTIAL/PLATELET
Abs Immature Granulocytes: 0.01 10*3/uL (ref 0.00–0.07)
Basophils Absolute: 0 10*3/uL (ref 0.0–0.1)
Basophils Relative: 1 %
Eosinophils Absolute: 0.1 10*3/uL (ref 0.0–0.5)
Eosinophils Relative: 1 %
HCT: 31.3 % — ABNORMAL LOW (ref 36.0–46.0)
Hemoglobin: 10.2 g/dL — ABNORMAL LOW (ref 12.0–15.0)
Immature Granulocytes: 0 %
Lymphocytes Relative: 24 %
Lymphs Abs: 1.4 10*3/uL (ref 0.7–4.0)
MCH: 28.4 pg (ref 26.0–34.0)
MCHC: 32.6 g/dL (ref 30.0–36.0)
MCV: 87.2 fL (ref 80.0–100.0)
Monocytes Absolute: 0.5 10*3/uL (ref 0.1–1.0)
Monocytes Relative: 8 %
Neutro Abs: 3.8 10*3/uL (ref 1.7–7.7)
Neutrophils Relative %: 66 %
Platelets: 292 10*3/uL (ref 150–400)
RBC: 3.59 MIL/uL — ABNORMAL LOW (ref 3.87–5.11)
RDW: 13.2 % (ref 11.5–15.5)
WBC: 5.8 10*3/uL (ref 4.0–10.5)
nRBC: 0 % (ref 0.0–0.2)

## 2018-10-26 LAB — FERRITIN: Ferritin: 7 ng/mL — ABNORMAL LOW (ref 11–307)

## 2018-10-26 LAB — IRON AND TIBC
Iron: 39 ug/dL (ref 28–170)
Saturation Ratios: 10 % — ABNORMAL LOW (ref 10.4–31.8)
TIBC: 390 ug/dL (ref 250–450)
UIBC: 351 ug/dL

## 2018-10-26 MED ORDER — SODIUM CHLORIDE 0.9 % IV SOLN
510.0000 mg | Freq: Once | INTRAVENOUS | Status: AC
  Administered 2018-10-26: 510 mg via INTRAVENOUS
  Filled 2018-10-26: qty 17

## 2018-10-26 MED ORDER — SODIUM CHLORIDE 0.9 % IV SOLN
INTRAVENOUS | Status: DC
  Administered 2018-10-26: 14:00:00 via INTRAVENOUS
  Filled 2018-10-26: qty 250

## 2018-10-26 NOTE — Progress Notes (Signed)
Patient reports not having energy and iron infusions has helped in the past.

## 2018-11-08 ENCOUNTER — Other Ambulatory Visit: Payer: Self-pay

## 2018-11-09 ENCOUNTER — Inpatient Hospital Stay: Payer: Medicare HMO

## 2018-11-09 ENCOUNTER — Other Ambulatory Visit: Payer: Self-pay

## 2018-11-09 VITALS — BP 120/52 | HR 55 | Temp 96.0°F | Resp 18

## 2018-11-09 DIAGNOSIS — D509 Iron deficiency anemia, unspecified: Secondary | ICD-10-CM | POA: Diagnosis not present

## 2018-11-09 DIAGNOSIS — D508 Other iron deficiency anemias: Secondary | ICD-10-CM

## 2018-11-09 MED ORDER — SODIUM CHLORIDE 0.9 % IV SOLN
Freq: Once | INTRAVENOUS | Status: AC
  Administered 2018-11-09: 13:00:00 via INTRAVENOUS
  Filled 2018-11-09: qty 250

## 2018-11-09 MED ORDER — SODIUM CHLORIDE 0.9 % IV SOLN
510.0000 mg | Freq: Once | INTRAVENOUS | Status: AC
  Administered 2018-11-09: 510 mg via INTRAVENOUS
  Filled 2018-11-09: qty 17

## 2018-11-10 NOTE — Progress Notes (Deleted)
[place]  [phone] [phone]  ID: Lindsey Ward OB: 07/03/1950  MR#: [id number]  [id number]  Patient Care Team: Venia Carbon, MD as PCP - General  CHIEF COMPLAINT: Iron deficiency anemia  INTERVAL HISTORY: Patient returns to clinic today for repeat laboratory work and further evaluation.  She is noticed increased weakness and fatigue over the past several weeks, but otherwise feels well.  She does not complain of pain today.  She has no neurologic complaints. She denies any recent fevers or illnesses. She has a good appetite and denies weight loss.  She denies any chest pain, shortness of breath, cough, or hemoptysis.  She denies any nausea, vomiting, constipation, or diarrhea. She denies any melena or hematochezia. She has no urinary complaints.  Patient offers no further specific complaints today.  REVIEW OF SYSTEMS:   Review of Systems  Constitutional: Positive for malaise/fatigue. Negative for fever and weight loss.  Respiratory: Negative.  Negative for cough.   Cardiovascular: Negative.  Negative for chest pain and leg swelling.  Gastrointestinal: Negative.  Negative for abdominal pain, blood in stool and melena.  Genitourinary: Negative.  Negative for hematuria.  Musculoskeletal: Positive for myalgias.  Skin: Negative.  Negative for rash.  Neurological: Negative.  Negative for focal weakness, weakness and headaches.  Psychiatric/Behavioral: Negative.  The patient is not nervous/anxious.     As per HPI. Otherwise, a complete review of systems is negative.  PAST MEDICAL HISTORY: Past Medical History:  Diagnosis Date  . Bowel obstruction ([place])   . Chronic fatigue   . Depression   . Fibromyalgia   . GERD (gastroesophageal reflux disease)   . Hyperlipemia   . Migraine   . Obstipation   . Osteopenia   . PUD (peptic ulcer disease)     PAST SURGICAL HISTORY: Past Surgical History:  Procedure Laterality Date  .  ABDOMINAL SURGERY    . APPENDECTOMY  2005  . BREAST BIOPSY Bilateral 1999   rt w/out clip - neg, lt w/clip - neg  . BREAST CYST ASPIRATION Right 2003   neg  . CHOLECYSTECTOMY  2004  . COLONOSCOPY WITH PROPOFOL N/A 06/09/2016   Procedure: COLONOSCOPY WITH PROPOFOL;  Surgeon: Jonathon Bellows, MD;  Location: [facility] ENDOSCOPY;  Service: Endoscopy;  Laterality: N/A;  . ESOPHAGOGASTRODUODENOSCOPY (EGD) WITH PROPOFOL N/A 06/09/2016   Procedure: ESOPHAGOGASTRODUODENOSCOPY (EGD) WITH PROPOFOL;  Surgeon: Jonathon Bellows, MD;  Location: [facility] ENDOSCOPY;  Service: Endoscopy;  Laterality: N/A;  . EYE SURGERY    . GIVENS CAPSULE STUDY N/A 06/23/2016   Procedure: GIVENS CAPSULE STUDY;  Surgeon: Jonathon Bellows, MD;  Location: [facility] ENDOSCOPY;  Service: Endoscopy;  Laterality: N/A;  . PARS PLANA VITRECTOMY W/ REPAIR OF MACULAR HOLE Left 01/2017   [facility]  . TOTAL ABDOMINAL HYSTERECTOMY W/ BILATERAL SALPINGOOPHORECTOMY  1986    FAMILY HISTORY: Family History  Problem Relation Age of Onset  . Heart disease Mother        AMI  . Stroke Mother   . Heart disease Father   . Hypertension Sister   . Breast cancer Sister   . Hypertension Sister   . Heart disease Sister        MI  . Diabetes Sister   . Kidney cancer Neg Hx   . Kidney disease Neg Hx   . Prostate cancer Neg Hx     ADVANCED DIRECTIVES (Y/N):  N  HEALTH MAINTENANCE: Social History   Tobacco Use  . Smoking status: Former Smoker    Packs/day: 1.00  Years: 40.00    Pack years: 40.00    Types: Cigarettes    Quit date: 09/25/2013    Years since quitting: 5.1  . Smokeless tobacco: Never Used  . Tobacco comment:    Substance Use Topics  . Alcohol use: Not Currently    Alcohol/week: 0.0 standard drinks    Comment: rarely  . Drug use: No     Colonoscopy:  PAP:  Bone density:  Lipid panel:  Allergies  Allergen Reactions  . Tizanidine Other (See Comments)    "throws me in a different world"  . Aspirin     REACTION: GI bleed  . Buspirone Hcl      REACTION: unspecified  . Ciprofloxacin     REACTION: unspecified  . Citalopram Other (See Comments)    Ear ringing.   . Codeine   . Diazepam   . Duloxetine     REACTION: sz like activity  . Ibuprofen     REACTION: GI bleed  . Imipramine Hcl Other (See Comments)    Ear ringing.   . Oxycodone-Acetaminophen     REACTION: unspecified  . Pregabalin     REACTION: kept her up and confusion  . Propoxyphene Hcl   . Risperidone     REACTION: confusion  . Sulfonamide Derivatives     REACTION: unspecified  . Tramadol Hcl     REACTION: doesn't remember what happened but couldn't tolerate  . Tramadol Hcl Other (See Comments)    REACTION: doesn't remember what happened but couldn't tolerate REACTION: doesn't remember what happened but couldn't tolerate REACTION: doesn't remember what happened but couldn't tolerate   . Venlafaxine     REACTION: unspecified  . Methocarbamol Rash    Current Outpatient Medications  Medication Sig Dispense Refill  . ALPRAZolam (XANAX) 1 MG tablet TAKE 1 TABLET (1 MG TOTAL) BY MOUTH 3 (THREE) TIMES DAILY AS NEEDED. 90 tablet 0  . cetirizine (ZYRTEC) 10 MG tablet TAKE 1 TABLET BY MOUTH EVERY DAY (Patient not taking: Reported on 10/26/2018) 30 tablet 11  . ezetimibe (ZETIA) 10 MG tablet Take 1 tablet (10 mg total) by mouth daily. 90 tablet 3  . HYDROcodone-acetaminophen (NORCO/VICODIN) 5-325 MG tablet Take 1 tablet by mouth 3 (three) times daily as needed. Dx Code M79.7 90 tablet 0  . omeprazole (PRILOSEC) 20 MG capsule Take 1 capsule (20 mg total) by mouth 2 (two) times daily. 180 capsule 1  . promethazine (PHENERGAN) 12.5 MG tablet Take 1-2 tablets (12.5-25 mg total) by mouth 3 (three) times daily as needed for nausea or vomiting. 60 tablet 0  . SUMAtriptan (IMITREX) 50 MG tablet TAKE 0.5-1 TABLET BY MOUTH DAILY AS NEEDED FOR MIGRAINE. 10 tablet 11   No current facility-administered medications for this visit.     OBJECTIVE: There were no vitals filed for  this visit.   There is no height or weight on file to calculate BMI.    ECOG FS:0 - Asymptomatic  General: Well-developed, well-nourished, no acute distress. Eyes: Pink conjunctiva, anicteric sclera. HEENT: Normocephalic, moist mucous membranes. Lungs: Clear to auscultation bilaterally. Heart: Regular rate and rhythm. No rubs, murmurs, or gallops. Abdomen: Soft, nontender, nondistended. No organomegaly noted, normoactive bowel sounds. Musculoskeletal: No edema, cyanosis, or clubbing. Neuro: Alert, answering all questions appropriately. Cranial nerves grossly intact. Skin: No rashes or petechiae noted. Psych: Normal affect.   LAB RESULTS:  Lab Results  Component Value Date   NA 135 02/13/2018   K 4.1 02/13/2018   CL 100 02/13/2018  CO2 28 02/13/2018   GLUCOSE 106 (H) 02/13/2018   BUN 19 02/13/2018   CREATININE 0.82 02/13/2018   CALCIUM 9.9 02/13/2018   PROT 7.4 02/13/2018   ALBUMIN 4.4 02/13/2018   AST 30 02/13/2018   ALT 18 02/13/2018   ALKPHOS 95 02/13/2018   BILITOT 0.4 02/13/2018   GFRNONAA >60 10/05/2017   GFRAA >60 10/05/2017    Lab Results  Component Value Date   WBC 5.8 10/26/2018   NEUTROABS 3.8 10/26/2018   HGB 10.2 (L) 10/26/2018   HCT 31.3 (L) 10/26/2018   MCV 87.2 10/26/2018   PLT 292 10/26/2018   Lab Results  Component Value Date   IRON 39 10/26/2018   TIBC 390 10/26/2018   IRONPCTSAT 10 (L) 10/26/2018   Lab Results  Component Value Date   FERRITIN 7 (L) 10/26/2018     STUDIES: No results found.  ASSESSMENT: Iron deficiency anemia.  PLAN:  1. Iron deficiency anemia: Patient underwent colonoscopy and upper endoscopy on May 20, 2016 that did not reveal a distinct etiology.  Video endoscopy on July 05, 2017 revealed a possible site of bleeding and an ulceration in her small intestine.  Patient's hemoglobin and iron stores have trended down and she is symptomatic.  Proceed with 510 mg IV Feraheme today.  Return to clinic in 1 week for second  infusion.  Patient will then return to clinic in 3 months with repeat laboratory work, further evaluation, and continuation of treatment if necessary.    I spent a total of 30 minutes face-to-face with the patient of which greater than 50% of the visit was spent in counseling and coordination of care as detailed above.   Patient expressed understanding and was in agreement with this plan. She also understands that She can call clinic at any time with any questions, concerns, or complaints.    Lloyd Huger, MD   11/10/2018 8:03 AM

## 2018-11-15 ENCOUNTER — Other Ambulatory Visit: Payer: Self-pay

## 2018-11-15 DIAGNOSIS — D509 Iron deficiency anemia, unspecified: Secondary | ICD-10-CM

## 2018-11-16 ENCOUNTER — Inpatient Hospital Stay: Payer: Medicare HMO

## 2018-11-16 ENCOUNTER — Telehealth: Payer: Self-pay | Admitting: Oncology

## 2018-11-16 ENCOUNTER — Inpatient Hospital Stay: Payer: Medicare HMO | Admitting: Oncology

## 2018-11-16 NOTE — Telephone Encounter (Signed)
LM for pt to call back if she still needs assiatnce

## 2018-11-16 NOTE — Progress Notes (Signed)
[place]  Telephone:(336) [phone] Fax:(336) [phone]  ID: JIADA GIRGENTI OB: [birth year]  MR#: [id number]  [id number]  Patient Care Team: Venia Carbon, MD as PCP - General  CHIEF COMPLAINT: Iron deficiency anemia  INTERVAL HISTORY: Patient returns to clinic today for repeat laboratory work and further evaluation.  She was seen less than a month ago and was mistakenly scheduled for 3 weeks rather than 3 months.  Patient states her 2 infusions of iron "did nothing" and she still feels significantly weak and fatigue.  She does not complain of pain today.  She has no neurologic complaints. She denies any recent fevers or illnesses. She has a good appetite and denies weight loss.  She denies any chest pain, shortness of breath, cough, or hemoptysis.  She denies any nausea, vomiting, constipation, or diarrhea. She denies any melena or hematochezia. She has no urinary complaints.  Patient offers no further specific complaints today.  REVIEW OF SYSTEMS:   Review of Systems  Constitutional: Positive for malaise/fatigue. Negative for fever and weight loss.  Respiratory: Negative.  Negative for cough.   Cardiovascular: Negative.  Negative for chest pain and leg swelling.  Gastrointestinal: Negative.  Negative for abdominal pain, blood in stool and melena.  Genitourinary: Negative.  Negative for hematuria.  Musculoskeletal: Negative.  Negative for myalgias.  Skin: Negative.  Negative for rash.  Neurological: Positive for weakness. Negative for focal weakness and headaches.  Psychiatric/Behavioral: Negative.  The patient is not nervous/anxious.     As per HPI. Otherwise, a complete review of systems is negative.  PAST MEDICAL HISTORY: Past Medical History:  Diagnosis Date  . Bowel obstruction ([place])   . Chronic fatigue   . Depression   . Fibromyalgia   . GERD (gastroesophageal reflux disease)   . Hyperlipemia   . Migraine   . Obstipation   . Osteopenia   . PUD  (peptic ulcer disease)     PAST SURGICAL HISTORY: Past Surgical History:  Procedure Laterality Date  . ABDOMINAL SURGERY    . APPENDECTOMY  2005  . BREAST BIOPSY Bilateral 1999   rt w/out clip - neg, lt w/clip - neg  . BREAST CYST ASPIRATION Right 2003   neg  . CHOLECYSTECTOMY  2004  . COLONOSCOPY WITH PROPOFOL N/A 06/09/2016   Procedure: COLONOSCOPY WITH PROPOFOL;  Surgeon: Jonathon Bellows, MD;  Location: [facility] ENDOSCOPY;  Service: Endoscopy;  Laterality: N/A;  . ESOPHAGOGASTRODUODENOSCOPY (EGD) WITH PROPOFOL N/A 06/09/2016   Procedure: ESOPHAGOGASTRODUODENOSCOPY (EGD) WITH PROPOFOL;  Surgeon: Jonathon Bellows, MD;  Location: [facility] ENDOSCOPY;  Service: Endoscopy;  Laterality: N/A;  . EYE SURGERY    . GIVENS CAPSULE STUDY N/A 06/23/2016   Procedure: GIVENS CAPSULE STUDY;  Surgeon: Jonathon Bellows, MD;  Location: [facility] ENDOSCOPY;  Service: Endoscopy;  Laterality: N/A;  . PARS PLANA VITRECTOMY W/ REPAIR OF MACULAR HOLE Left 01/2017   [facility]  . TOTAL ABDOMINAL HYSTERECTOMY W/ BILATERAL SALPINGOOPHORECTOMY  1986    FAMILY HISTORY: Family History  Problem Relation Age of Onset  . Heart disease Mother        AMI  . Stroke Mother   . Heart disease Father   . Hypertension Sister   . Breast cancer Sister   . Hypertension Sister   . Heart disease Sister        MI  . Diabetes Sister   . Kidney cancer Neg Hx   . Kidney disease Neg Hx   . Prostate cancer Neg Hx     ADVANCED DIRECTIVES (  Y/N):  N  HEALTH MAINTENANCE: Social History   Tobacco Use  . Smoking status: Former Smoker    Packs/day: 1.00    Years: 40.00    Pack years: 40.00    Types: Cigarettes    Quit date: 09/25/2013    Years since quitting: 5.1  . Smokeless tobacco: Never Used  . Tobacco comment:    Substance Use Topics  . Alcohol use: Not Currently    Alcohol/week: 0.0 standard drinks    Comment: rarely  . Drug use: No     Colonoscopy:  PAP:  Bone density:  Lipid panel:  Allergies  Allergen Reactions  . Tizanidine Other  (See Comments)    "throws me in a different world"  . Aspirin     REACTION: GI bleed  . Buspirone Hcl     REACTION: unspecified  . Ciprofloxacin     REACTION: unspecified  . Citalopram Other (See Comments)    Ear ringing.   . Codeine   . Diazepam   . Duloxetine     REACTION: sz like activity  . Ibuprofen     REACTION: GI bleed  . Imipramine Hcl Other (See Comments)    Ear ringing.   . Oxycodone-Acetaminophen     REACTION: unspecified  . Pregabalin     REACTION: kept her up and confusion  . Propoxyphene Hcl   . Risperidone     REACTION: confusion  . Sulfonamide Derivatives     REACTION: unspecified  . Tramadol Hcl     REACTION: doesn't remember what happened but couldn't tolerate  . Tramadol Hcl Other (See Comments)    REACTION: doesn't remember what happened but couldn't tolerate REACTION: doesn't remember what happened but couldn't tolerate REACTION: doesn't remember what happened but couldn't tolerate   . Venlafaxine     REACTION: unspecified  . Methocarbamol Rash    Current Outpatient Medications  Medication Sig Dispense Refill  . ALPRAZolam (XANAX) 1 MG tablet TAKE 1 TABLET (1 MG TOTAL) BY MOUTH 3 (THREE) TIMES DAILY AS NEEDED. 90 tablet 0  . HYDROcodone-acetaminophen (NORCO/VICODIN) 5-325 MG tablet Take 1 tablet by mouth 3 (three) times daily as needed. Dx Code M79.7 90 tablet 0  . omeprazole (PRILOSEC) 20 MG capsule Take 1 capsule (20 mg total) by mouth 2 (two) times daily. 180 capsule 1  . promethazine (PHENERGAN) 12.5 MG tablet Take 1-2 tablets (12.5-25 mg total) by mouth 3 (three) times daily as needed for nausea or vomiting. 60 tablet 0  . SUMAtriptan (IMITREX) 50 MG tablet TAKE 0.5-1 TABLET BY MOUTH DAILY AS NEEDED FOR MIGRAINE. 10 tablet 11  . cetirizine (ZYRTEC) 10 MG tablet TAKE 1 TABLET BY MOUTH EVERY DAY (Patient not taking: Reported on 10/26/2018) 30 tablet 11  . ezetimibe (ZETIA) 10 MG tablet Take 1 tablet (10 mg total) by mouth daily. (Patient not  taking: Reported on 11/22/2018) 90 tablet 3   No current facility-administered medications for this visit.     OBJECTIVE: Vitals:   11/23/18 1407  BP: 102/76  Pulse: 64  Resp: 18  Temp: (!) 96.7 F (35.9 C)  SpO2: 100%     Body mass index is 20.07 kg/m.    ECOG FS:0 - Asymptomatic  General: Well-developed, well-nourished, no acute distress. Eyes: Pink conjunctiva, anicteric sclera. HEENT: Normocephalic, moist mucous membranes. Lungs: Clear to auscultation bilaterally. Heart: Regular rate and rhythm. No rubs, murmurs, or gallops. Abdomen: Soft, nontender, nondistended. No organomegaly noted, normoactive bowel sounds. Musculoskeletal: No edema, cyanosis, or clubbing. Neuro:  Alert, answering all questions appropriately. Cranial nerves grossly intact. Skin: No rashes or petechiae noted. Psych: Normal affect.  LAB RESULTS:  Lab Results  Component Value Date   NA 135 02/13/2018   K 4.1 02/13/2018   CL 100 02/13/2018   CO2 28 02/13/2018   GLUCOSE 106 (H) 02/13/2018   BUN 19 02/13/2018   CREATININE 0.82 02/13/2018   CALCIUM 9.9 02/13/2018   PROT 7.4 02/13/2018   ALBUMIN 4.4 02/13/2018   AST 30 02/13/2018   ALT 18 02/13/2018   ALKPHOS 95 02/13/2018   BILITOT 0.4 02/13/2018   GFRNONAA >60 10/05/2017   GFRAA >60 10/05/2017    Lab Results  Component Value Date   WBC 7.3 11/23/2018   NEUTROABS 5.3 11/23/2018   HGB 11.5 (L) 11/23/2018   HCT 35.0 (L) 11/23/2018   MCV 90.7 11/23/2018   PLT 289 11/23/2018   Lab Results  Component Value Date   IRON 39 10/26/2018   TIBC 390 10/26/2018   IRONPCTSAT 10 (L) 10/26/2018   Lab Results  Component Value Date   FERRITIN 7 (L) 10/26/2018     STUDIES: No results found.  ASSESSMENT: Iron deficiency anemia.  PLAN:  1. Iron deficiency anemia: Patient underwent colonoscopy and upper endoscopy on May 20, 2016 that did not reveal a distinct etiology.  Video endoscopy on July 05, 2017 revealed a possible site of bleeding and  an ulceration in her small intestine.  Patient's hemoglobin has significantly improved to 11.5 since receiving IV Feraheme on November 09, 2018.  This will likely continue to increase.  Repeat iron stores are pending at time of dictation.  She does not require additional IV Feraheme.  Return to clinic in 3 months with repeat laboratory work, further evaluation, and additional treatment if necessary. 2.  Weakness and fatigue: Unlikely related to patient's iron deficiency anemia.  She reports that she is being evaluated by her primary care physician for her fibromyalgia.   I spent a total of 20 minutes face-to-face with the patient of which greater than 50% of the visit was spent in counseling and coordination of care as detailed above.   Patient expressed understanding and was in agreement with this plan. She also understands that She can call clinic at any time with any questions, concerns, or complaints.    Lloyd Huger, MD   11/23/2018 2:41 PM

## 2018-11-20 DIAGNOSIS — H35342 Macular cyst, hole, or pseudohole, left eye: Secondary | ICD-10-CM | POA: Diagnosis not present

## 2018-11-22 ENCOUNTER — Encounter: Payer: Self-pay | Admitting: Oncology

## 2018-11-22 ENCOUNTER — Telehealth: Payer: Self-pay | Admitting: Internal Medicine

## 2018-11-22 ENCOUNTER — Other Ambulatory Visit: Payer: Self-pay

## 2018-11-22 DIAGNOSIS — M797 Fibromyalgia: Secondary | ICD-10-CM

## 2018-11-22 NOTE — Telephone Encounter (Signed)
Patient is requesting a referral to Dr Lavone Orn in [place] for the her fibromyalgia Patient stated she has been in the bed for the last 5 days. And would really like to be referred to a specialist.

## 2018-11-22 NOTE — Progress Notes (Signed)
Patient stated that she continues to feel tired and fatigued despite getting infusions. Patient stated that she would like to know why if possible.

## 2018-11-22 NOTE — Telephone Encounter (Signed)
Pt asked that you make a referral to Dr Meda Coffee. She cannot take the pain.

## 2018-11-22 NOTE — Telephone Encounter (Signed)
Spoke to pt. She said she will hang on and wait to hear from someone about the referral.

## 2018-11-22 NOTE — Telephone Encounter (Signed)
Please let her know that I put in the referral but I am not sure they will see her for fibromyalgia (since treatment options are limited) It will take a while to work on this--hopefully she will hear by sometime next week

## 2018-11-22 NOTE — Telephone Encounter (Signed)
Dr Gabriel Carina is an endocrinologist and generally wouldn't treat fibromyalgia. Even rheumatologists have not been accepting referrals for people with fibromyalgia, but if she finds one that will (like Dr Meda Coffee at [place]), I will make the referral

## 2018-11-23 ENCOUNTER — Other Ambulatory Visit: Payer: Self-pay

## 2018-11-23 ENCOUNTER — Encounter: Payer: Self-pay | Admitting: Oncology

## 2018-11-23 ENCOUNTER — Inpatient Hospital Stay (HOSPITAL_BASED_OUTPATIENT_CLINIC_OR_DEPARTMENT_OTHER): Payer: Medicare HMO | Admitting: Oncology

## 2018-11-23 ENCOUNTER — Inpatient Hospital Stay: Payer: Medicare HMO

## 2018-11-23 ENCOUNTER — Inpatient Hospital Stay: Payer: Medicare HMO | Attending: Oncology

## 2018-11-23 VITALS — BP 102/76 | HR 64 | Temp 96.7°F | Resp 18 | Wt 113.3 lb

## 2018-11-23 DIAGNOSIS — D509 Iron deficiency anemia, unspecified: Secondary | ICD-10-CM | POA: Insufficient documentation

## 2018-11-23 LAB — IRON AND TIBC
Iron: 99 ug/dL (ref 28–170)
Saturation Ratios: 32 % — ABNORMAL HIGH (ref 10.4–31.8)
TIBC: 307 ug/dL (ref 250–450)
UIBC: 208 ug/dL

## 2018-11-23 LAB — CBC WITH DIFFERENTIAL/PLATELET
Abs Immature Granulocytes: 0.02 10*3/uL (ref 0.00–0.07)
Basophils Absolute: 0 10*3/uL (ref 0.0–0.1)
Basophils Relative: 0 %
Eosinophils Absolute: 0.1 10*3/uL (ref 0.0–0.5)
Eosinophils Relative: 1 %
HCT: 35 % — ABNORMAL LOW (ref 36.0–46.0)
Hemoglobin: 11.5 g/dL — ABNORMAL LOW (ref 12.0–15.0)
Immature Granulocytes: 0 %
Lymphocytes Relative: 19 %
Lymphs Abs: 1.4 10*3/uL (ref 0.7–4.0)
MCH: 29.8 pg (ref 26.0–34.0)
MCHC: 32.9 g/dL (ref 30.0–36.0)
MCV: 90.7 fL (ref 80.0–100.0)
Monocytes Absolute: 0.5 10*3/uL (ref 0.1–1.0)
Monocytes Relative: 6 %
Neutro Abs: 5.3 10*3/uL (ref 1.7–7.7)
Neutrophils Relative %: 74 %
Platelets: 289 10*3/uL (ref 150–400)
RBC: 3.86 MIL/uL — ABNORMAL LOW (ref 3.87–5.11)
RDW: 14.5 % (ref 11.5–15.5)
WBC: 7.3 10*3/uL (ref 4.0–10.5)
nRBC: 0 % (ref 0.0–0.2)

## 2018-11-23 LAB — FERRITIN: Ferritin: 439 ng/mL — ABNORMAL HIGH (ref 11–307)

## 2018-11-23 NOTE — Progress Notes (Signed)
Pt in for follow up, reports not doing well and still very fatigued all the time.

## 2018-11-29 NOTE — Telephone Encounter (Signed)
Patient stated that she called and spoke to someone at [facility] and they stated that they do treat people with Fibromyalgia  She stated she wanted the referral sent to them

## 2018-11-29 NOTE — Telephone Encounter (Signed)
Okay She spoke to [facility] so I guess we can try their rheumatology department (may still be better if someone at [facility] will see her--as much closer)

## 2018-11-30 NOTE — Telephone Encounter (Signed)
Patient has no out of network benefits so she cant go to [facility], spoke with the patient and told her to look into [facility] Medicare to see if that plan would allow her to go to [facility]. She will look into it for possibly changing her plan.

## 2018-12-03 ENCOUNTER — Encounter: Payer: Self-pay | Admitting: Internal Medicine

## 2018-12-03 ENCOUNTER — Other Ambulatory Visit: Payer: Self-pay

## 2018-12-03 ENCOUNTER — Telehealth: Payer: Self-pay

## 2018-12-03 ENCOUNTER — Ambulatory Visit (INDEPENDENT_AMBULATORY_CARE_PROVIDER_SITE_OTHER): Payer: Medicare HMO | Admitting: Internal Medicine

## 2018-12-03 DIAGNOSIS — M797 Fibromyalgia: Secondary | ICD-10-CM | POA: Diagnosis not present

## 2018-12-03 MED ORDER — PREDNISONE 20 MG PO TABS: 40.0000 mg | ORAL_TABLET | Freq: Every day | ORAL | 0 refills | Status: DC

## 2018-12-03 MED ORDER — CYCLOBENZAPRINE HCL 5 MG PO TABS: 5.0000 mg | ORAL_TABLET | Freq: Every evening | ORAL | 0 refills | Status: DC | PRN

## 2018-12-03 NOTE — Telephone Encounter (Signed)
Okay Will review options for her at that visit

## 2018-12-03 NOTE — Telephone Encounter (Signed)
Pt said she is walking on her tip toes due to pain in muscles and joints, fibromyalgia. Pt cannot go to [facility] rheumatology due to ins not approving.Rosaria Ferries [facility] said there is no referral for fibromyalgia except pain clinic. And pt advised Rosaria Ferries [facility] that she does not want to go to pain clinic. Pt is sitting in [facility] parking lot wanting to know what can be done for her pain. Pt said went to pain clinic 2000-2005 and was told there was nothing they could do for pt and not to come back. Pt pain level now a 10 if tries to stand; pain worse on lt side. Pt scheduled in office appt with Dr Silvio Pate today at 2:45.Pt has no covid symptoms except muscle pain(fibromyalgia), no travel and no known exposure to + covid.UC & ED precautions given and pt voiced understanding.

## 2018-12-03 NOTE — Progress Notes (Signed)
Subjective:    Patient ID: Lindsey [facility], female    DOB: May 01, 1950, 68 y.o.   MRN: [id number]  HPI Here due to worsening of fibromyalgia pain  For the past 3 weeks--in bed 5-6 days per week Spending all day there---other than getting up to get food or go to bathroom Particularly in left hip and tingling down to her toes Every muscle feels tight--arms, neck, back, legs  Hydrocodone not helping much---limits to 2 a day at most Has used flexeril in the past--did seem to help some then Was on gabapentin in the past---doesn't remember if it helped (but she was on it for some time)  Current Outpatient Medications on File Prior to Visit  Medication Sig Dispense Refill  . ALPRAZolam (XANAX) 1 MG tablet TAKE 1 TABLET (1 MG TOTAL) BY MOUTH 3 (THREE) TIMES DAILY AS NEEDED. 90 tablet 0  . cetirizine (ZYRTEC) 10 MG tablet TAKE 1 TABLET BY MOUTH EVERY DAY 30 tablet 11  . HYDROcodone-acetaminophen (NORCO/VICODIN) 5-325 MG tablet Take 1 tablet by mouth 3 (three) times daily as needed. Dx Code M79.7 90 tablet 0  . omeprazole (PRILOSEC) 20 MG capsule Take 1 capsule (20 mg total) by mouth 2 (two) times daily. 180 capsule 1  . promethazine (PHENERGAN) 12.5 MG tablet Take 1-2 tablets (12.5-25 mg total) by mouth 3 (three) times daily as needed for nausea or vomiting. 60 tablet 0  . SUMAtriptan (IMITREX) 50 MG tablet TAKE 0.5-1 TABLET BY MOUTH DAILY AS NEEDED FOR MIGRAINE. 10 tablet 11  . ezetimibe (ZETIA) 10 MG tablet Take 1 tablet (10 mg total) by mouth daily. (Patient not taking: Reported on 11/22/2018) 90 tablet 3   No current facility-administered medications on file prior to visit.     Allergies  Allergen Reactions  . Tizanidine Other (See Comments)    "throws me in a different world"  . Aspirin     REACTION: GI bleed  . Buspirone Hcl     REACTION: unspecified  . Ciprofloxacin     REACTION: unspecified  . Citalopram Other (See Comments)    Ear ringing.   . Codeine   . Diazepam   .  Duloxetine     REACTION: sz like activity  . Ibuprofen     REACTION: GI bleed  . Imipramine Hcl Other (See Comments)    Ear ringing.   . Oxycodone-Acetaminophen     REACTION: unspecified  . Pregabalin     REACTION: kept her up and confusion  . Propoxyphene Hcl   . Risperidone     REACTION: confusion  . Sulfonamide Derivatives     REACTION: unspecified  . Tramadol Hcl     REACTION: doesn't remember what happened but couldn't tolerate  . Tramadol Hcl Other (See Comments)    REACTION: doesn't remember what happened but couldn't tolerate REACTION: doesn't remember what happened but couldn't tolerate REACTION: doesn't remember what happened but couldn't tolerate   . Venlafaxine     REACTION: unspecified  . Methocarbamol Rash    Past Medical History:  Diagnosis Date  . Bowel obstruction ([place])   . Chronic fatigue   . Depression   . Fibromyalgia   . GERD (gastroesophageal reflux disease)   . Hyperlipemia   . Migraine   . Obstipation   . Osteopenia   . PUD (peptic ulcer disease)     Past Surgical History:  Procedure Laterality Date  . ABDOMINAL SURGERY    . APPENDECTOMY  2005  . BREAST BIOPSY Bilateral 1999  rt w/out clip - neg, lt w/clip - neg  . BREAST CYST ASPIRATION Right 2003   neg  . CHOLECYSTECTOMY  2004  . COLONOSCOPY WITH PROPOFOL N/A 06/09/2016   Procedure: COLONOSCOPY WITH PROPOFOL;  Surgeon: Jonathon Bellows, MD;  Location: [facility] ENDOSCOPY;  Service: Endoscopy;  Laterality: N/A;  . ESOPHAGOGASTRODUODENOSCOPY (EGD) WITH PROPOFOL N/A 06/09/2016   Procedure: ESOPHAGOGASTRODUODENOSCOPY (EGD) WITH PROPOFOL;  Surgeon: Jonathon Bellows, MD;  Location: [facility] ENDOSCOPY;  Service: Endoscopy;  Laterality: N/A;  . EYE SURGERY    . GIVENS CAPSULE STUDY N/A 06/23/2016   Procedure: GIVENS CAPSULE STUDY;  Surgeon: Jonathon Bellows, MD;  Location: [facility] ENDOSCOPY;  Service: Endoscopy;  Laterality: N/A;  . PARS PLANA VITRECTOMY W/ REPAIR OF MACULAR HOLE Left 01/2017   [facility]  . TOTAL ABDOMINAL  HYSTERECTOMY W/ BILATERAL SALPINGOOPHORECTOMY  1986    Family History  Problem Relation Age of Onset  . Heart disease Mother        AMI  . Stroke Mother   . Heart disease Father   . Hypertension Sister   . Breast cancer Sister   . Hypertension Sister   . Heart disease Sister        MI  . Diabetes Sister   . Kidney cancer Neg Hx   . Kidney disease Neg Hx   . Prostate cancer Neg Hx     Social History   Socioeconomic History  . Marital status: Single    Spouse name: Not on file  . Number of children: 1  . Years of education: Not on file  . Highest education level: Not on file  Occupational History  . Occupation: DISABLED  Social Needs  . Financial resource strain: Not on file  . Food insecurity    Worry: Not on file    Inability: Not on file  . Transportation needs    Medical: Not on file    Non-medical: Not on file  Tobacco Use  . Smoking status: Former Smoker    Packs/day: 1.00    Years: 40.00    Pack years: 40.00    Types: Cigarettes    Quit date: 09/25/2013    Years since quitting: 5.1  . Smokeless tobacco: Never Used  . Tobacco comment:    Substance and Sexual Activity  . Alcohol use: Not Currently    Alcohol/week: 0.0 standard drinks    Comment: rarely  . Drug use: No  . Sexual activity: Not on file  Lifestyle  . Physical activity    Days per week: Not on file    Minutes per session: Not on file  . Stress: Not on file  Relationships  . Social Herbalist on phone: Not on file    Gets together: Not on file    Attends religious service: Not on file    Active member of club or organization: Not on file    Attends meetings of clubs or organizations: Not on file    Relationship status: Not on file  . Intimate partner violence    Fear of current or ex partner: Not on file    Emotionally abused: Not on file    Physically abused: Not on file    Forced sexual activity: Not on file  Other Topics Concern  . Not on file  Social History  Narrative   Living alone again      No living will   Daughter should make health care decisions   Requests DNR---done 10/24/14   No tube  feeds if cognitively unaware   Highest level of education: 9th   Review of Systems  Appetite is okay Weight is about the same Does have some palpitations--- feels fast     Objective:   Physical Exam  Constitutional: No distress.  Neck:  Tightness and tenderness in trapezius bilaterally  Musculoskeletal:     Comments: Mild spine tenderness Moderate S-I tenderness Normal ROM in left hip  SLR basically negative  Neurological:  Slightly antalgic gait No focal weakness           Assessment & Plan:

## 2018-12-03 NOTE — Assessment & Plan Note (Signed)
Has worsened Left hip to leg symptoms could be radiculopathy---will try 5 day prednisone Also cyclobenzaprine at bedtime  If ongoing problems, would stop cyclobenzaprine and retry gabapentin

## 2018-12-03 NOTE — Patient Instructions (Signed)
If you are not feeling better by next week, we will change to gabapentin to see if that helps (and stop the flexeril/cyclobenzaprine)

## 2018-12-04 ENCOUNTER — Telehealth: Payer: Self-pay | Admitting: Internal Medicine

## 2018-12-04 LAB — COMPREHENSIVE METABOLIC PANEL
ALT: 18 U/L (ref 0–35)
AST: 29 U/L (ref 0–37)
Albumin: 4 g/dL (ref 3.5–5.2)
Alkaline Phosphatase: 75 U/L (ref 39–117)
BUN: 13 mg/dL (ref 6–23)
CO2: 29 mEq/L (ref 19–32)
Calcium: 9.5 mg/dL (ref 8.4–10.5)
Chloride: 105 mEq/L (ref 96–112)
Creatinine, Ser: 0.61 mg/dL (ref 0.40–1.20)
GFR: 96.89 mL/min (ref >60.00)
Glucose, Bld: 94 mg/dL (ref 70–99)
Potassium: 4.1 mEq/L (ref 3.5–5.1)
Sodium: 140 mEq/L (ref 135–145)
Total Bilirubin: 0.4 mg/dL (ref 0.2–1.2)
Total Protein: 6.3 g/dL (ref 6.0–8.3)

## 2018-12-04 LAB — SEDIMENTATION RATE: Sed Rate: 10 mm/hr (ref 0–30)

## 2018-12-04 NOTE — Telephone Encounter (Signed)
Need new order for Rheumatology.  [facility] doesn't treat rheumatology.

## 2018-12-05 NOTE — Telephone Encounter (Signed)
Dr Tiffany Kocher wasn't aware that Rheumatology wont see Fibromyalgia disregard the message.

## 2018-12-05 NOTE — Telephone Encounter (Signed)
What? I thought we just cancelled all these [place] does have Rheumatologists but they don't see fibromyalgia

## 2018-12-05 NOTE — Telephone Encounter (Signed)
okay

## 2018-12-07 ENCOUNTER — Other Ambulatory Visit: Payer: Self-pay | Admitting: Internal Medicine

## 2018-12-07 NOTE — Telephone Encounter (Signed)
I called her back to let her know that Dr Silvio Pate will not be back until Monday and this may not be taken care of until Monday. He may do it over the weekend. She will check with the pharmacy. Said she is fine until Monday.

## 2018-12-07 NOTE — Telephone Encounter (Signed)
Patient stated that she has 10 tablets left. But she feels like she could use these before we are back in the office on Monday. She stated she is having a lot of panic attacks lately and her nerves are shot.      She stated she is not getting enough medication to last her a month  C/b# [phone]

## 2018-12-10 ENCOUNTER — Telehealth: Payer: Self-pay | Admitting: *Deleted

## 2018-12-10 NOTE — Telephone Encounter (Signed)
Spoke to pt. She said the Flexeril was helping at night. Asking if she could take one during the day. It does not make her sleepy. If not, she said she is willing to try gabapentin again. Send to [facility].

## 2018-12-10 NOTE — Telephone Encounter (Signed)
I would not use the cyclobenzaprine and the gabapentin. Okay to add a dose in the day (so up to 5mg  bid)---if she is not satisfied with that after a couple of weeks, I would stop that and try the gabapentin

## 2018-12-10 NOTE — Telephone Encounter (Signed)
Patient left a voicemail stating that she saw you last Monday for her hip and was given Prednisone and a muscle relaxer. Patient stated that the medication helped a few days and now the symptoms are back. Patient wants to know what can be done next?

## 2018-12-10 NOTE — Telephone Encounter (Signed)
Let her know if the muscle relaxer cyclobenzaprine is not giving any ongoing relief, the next thing I was going to try was gabapentin again. Does she want to try that now?

## 2018-12-11 NOTE — Telephone Encounter (Signed)
Spoke to pt. She will try the cyclobenzaprine twice a day and let us know if it does not help

## 2018-12-27 ENCOUNTER — Other Ambulatory Visit: Payer: Self-pay | Admitting: Internal Medicine

## 2019-01-04 ENCOUNTER — Telehealth: Payer: Self-pay

## 2019-01-04 NOTE — Telephone Encounter (Signed)
Patient called stating her left hip and leg is giving her trouble again like it did in September when she was seen by Dr Silvio Pate and at that time she was giving Prednisone and Flexeril and it helped. Symptoms started up again this week and she went ahead and started to take Flexeril that she had left over. Patient was wondering if she could maybe get RX for Prednisone also like before, she did not want to get worse over the weekend. Advised patient Dr Silvio Pate is gone for the day but I could check with another provider to see if they would be able to answer. No appointments left for today. CB [phone]

## 2019-01-04 NOTE — Telephone Encounter (Signed)
Patient advised and she will call in the morning and I advised that [facility] will be abel to transfer her to the [place] office that is open tomorrow.

## 2019-01-04 NOTE — Telephone Encounter (Signed)
I think she should be seen. Saturday clinic availability?

## 2019-01-28 ENCOUNTER — Other Ambulatory Visit: Payer: Self-pay

## 2019-01-28 NOTE — Telephone Encounter (Signed)
Name of Medication: hydrocodone apap 5-325 mg Name of Pharmacy: [facility] or Written Date and Quantity: # 38 on 10/09/18 Last Office Visit and Type:12/03/18  Acute for pain Next Office Visit and Type: 03/25/19 AWV Last Controlled Substance Agreement Date: 03/27/18 Last UDS:03/22/18

## 2019-01-29 MED ORDER — HYDROCODONE-ACETAMINOPHEN 5-325 MG PO TABS: 1.0000 | ORAL_TABLET | Freq: Three times a day (TID) | ORAL | 0 refills | Status: DC | PRN

## 2019-02-05 ENCOUNTER — Other Ambulatory Visit: Payer: Self-pay | Admitting: Internal Medicine

## 2019-02-05 NOTE — Telephone Encounter (Signed)
Pt called and does not think she has enough alprazolam to last until Dr Silvio Pate returns to office on 02/11/19. Pt request alprazolam 1 mg to [facility]. Pt said she averages taking 2 alprazolam daily but sometimes takes the 3rd on if has panic attack.  Name of Medication: alprazolam 1 mg Name of Pharmacy: [facility] Date and Quantity: # 27 on 12/09/18 Last Office Visit and Type:12/03/18 for pain  Next Office Visit and Type:03/25/2019 AWV  Last Controlled Substance Agreement Date: 03/27/18 Last UDS:03/22/18  Pt request cb when done.

## 2019-02-06 NOTE — Telephone Encounter (Signed)
Medication sent to pharmacy  

## 2019-02-06 NOTE — Telephone Encounter (Signed)
Pt notified alprazolam sent to [facility] and pt voiced understanding and very appreciative.

## 2019-02-06 NOTE — Telephone Encounter (Signed)
Pt request cb when refill done; pt said she will not have enough med to last until next wk.

## 2019-02-24 NOTE — Progress Notes (Deleted)
[place]  [phone] [phone]  ID: Lindsey Ward OB: 06/12/1950  MR#: [id number]  [id number]  Patient Care Team: Venia Carbon, MD as PCP - General  CHIEF COMPLAINT: Iron deficiency anemia  INTERVAL HISTORY: Patient returns to clinic today for repeat laboratory work and further evaluation.  She was seen less than a month ago and was mistakenly scheduled for 3 weeks rather than 3 months.  Patient states her 2 infusions of iron "did nothing" and she still feels significantly weak and fatigue.  She does not complain of pain today.  She has no neurologic complaints. She denies any recent fevers or illnesses. She has a good appetite and denies weight loss.  She denies any chest pain, shortness of breath, cough, or hemoptysis.  She denies any nausea, vomiting, constipation, or diarrhea. She denies any melena or hematochezia. She has no urinary complaints.  Patient offers no further specific complaints today.  REVIEW OF SYSTEMS:   Review of Systems  Constitutional: Positive for malaise/fatigue. Negative for fever and weight loss.  Respiratory: Negative.  Negative for cough.   Cardiovascular: Negative.  Negative for chest pain and leg swelling.  Gastrointestinal: Negative.  Negative for abdominal pain, blood in stool and melena.  Genitourinary: Negative.  Negative for hematuria.  Musculoskeletal: Negative.  Negative for myalgias.  Skin: Negative.  Negative for rash.  Neurological: Positive for weakness. Negative for focal weakness and headaches.  Psychiatric/Behavioral: Negative.  The patient is not nervous/anxious.     As per HPI. Otherwise, a complete review of systems is negative.  PAST MEDICAL HISTORY: Past Medical History:  Diagnosis Date  . Bowel obstruction ([place])   . Chronic fatigue   . Depression   . Fibromyalgia   . GERD (gastroesophageal reflux disease)   . Hyperlipemia   . Migraine   . Obstipation   . Osteopenia   . PUD  (peptic ulcer disease)     PAST SURGICAL HISTORY: Past Surgical History:  Procedure Laterality Date  . ABDOMINAL SURGERY    . APPENDECTOMY  2005  . BREAST BIOPSY Bilateral 1999   rt w/out clip - neg, lt w/clip - neg  . BREAST CYST ASPIRATION Right 2003   neg  . CHOLECYSTECTOMY  2004  . COLONOSCOPY WITH PROPOFOL N/A 06/09/2016   Procedure: COLONOSCOPY WITH PROPOFOL;  Surgeon: Jonathon Bellows, MD;  Location: [facility] ENDOSCOPY;  Service: Endoscopy;  Laterality: N/A;  . ESOPHAGOGASTRODUODENOSCOPY (EGD) WITH PROPOFOL N/A 06/09/2016   Procedure: ESOPHAGOGASTRODUODENOSCOPY (EGD) WITH PROPOFOL;  Surgeon: Jonathon Bellows, MD;  Location: [facility] ENDOSCOPY;  Service: Endoscopy;  Laterality: N/A;  . EYE SURGERY    . GIVENS CAPSULE STUDY N/A 06/23/2016   Procedure: GIVENS CAPSULE STUDY;  Surgeon: Jonathon Bellows, MD;  Location: [facility] ENDOSCOPY;  Service: Endoscopy;  Laterality: N/A;  . PARS PLANA VITRECTOMY W/ REPAIR OF MACULAR HOLE Left 01/2017   [facility]  . TOTAL ABDOMINAL HYSTERECTOMY W/ BILATERAL SALPINGOOPHORECTOMY  1986    FAMILY HISTORY: Family History  Problem Relation Age of Onset  . Heart disease Mother        AMI  . Stroke Mother   . Heart disease Father   . Hypertension Sister   . Breast cancer Sister   . Hypertension Sister   . Heart disease Sister        MI  . Diabetes Sister   . Kidney cancer Neg Hx   . Kidney disease Neg Hx   . Prostate cancer Neg Hx     ADVANCED DIRECTIVES (  Y/N):  N  HEALTH MAINTENANCE: Social History   Tobacco Use  . Smoking status: Former Smoker    Packs/day: 1.00    Years: 40.00    Pack years: 40.00    Types: Cigarettes    Quit date: 09/25/2013    Years since quitting: 5.4  . Smokeless tobacco: Never Used  . Tobacco comment:    Substance Use Topics  . Alcohol use: Not Currently    Alcohol/week: 0.0 standard drinks    Comment: rarely  . Drug use: No     Colonoscopy:  PAP:  Bone density:  Lipid panel:  Allergies  Allergen Reactions  . Tizanidine Other  (See Comments)    "throws me in a different world"  . Aspirin     REACTION: GI bleed  . Buspirone Hcl     REACTION: unspecified  . Ciprofloxacin     REACTION: unspecified  . Citalopram Other (See Comments)    Ear ringing.   . Codeine   . Diazepam   . Duloxetine     REACTION: sz like activity  . Ibuprofen     REACTION: GI bleed  . Imipramine Hcl Other (See Comments)    Ear ringing.   . Oxycodone-Acetaminophen     REACTION: unspecified  . Pregabalin     REACTION: kept her up and confusion  . Propoxyphene Hcl   . Risperidone     REACTION: confusion  . Sulfonamide Derivatives     REACTION: unspecified  . Tramadol Hcl     REACTION: doesn't remember what happened but couldn't tolerate  . Tramadol Hcl Other (See Comments)    REACTION: doesn't remember what happened but couldn't tolerate REACTION: doesn't remember what happened but couldn't tolerate REACTION: doesn't remember what happened but couldn't tolerate   . Venlafaxine     REACTION: unspecified  . Methocarbamol Rash    Current Outpatient Medications  Medication Sig Dispense Refill  . ALPRAZolam (XANAX) 1 MG tablet TAKE 1 TABLET (1 MG TOTAL) BY MOUTH 3 (THREE) TIMES DAILY AS NEEDED. 90 tablet 0  . cetirizine (ZYRTEC) 10 MG tablet TAKE 1 TABLET BY MOUTH EVERY DAY 30 tablet 11  . cyclobenzaprine (FLEXERIL) 5 MG tablet Take 1 tablet (5 mg total) by mouth at bedtime as needed for muscle spasms. 30 tablet 0  . ezetimibe (ZETIA) 10 MG tablet Take 1 tablet (10 mg total) by mouth daily. (Patient not taking: Reported on 11/22/2018) 90 tablet 3  . HYDROcodone-acetaminophen (NORCO/VICODIN) 5-325 MG tablet Take 1 tablet by mouth 3 (three) times daily as needed. Dx Code M79.7 90 tablet 0  . omeprazole (PRILOSEC) 20 MG capsule TAKE 1 CAPSULE BY MOUTH TWICE A DAY 180 capsule 3  . predniSONE (DELTASONE) 20 MG tablet Take 2 tablets (40 mg total) by mouth daily. 10 tablet 0  . promethazine (PHENERGAN) 12.5 MG tablet Take 1-2 tablets  (12.5-25 mg total) by mouth 3 (three) times daily as needed for nausea or vomiting. 60 tablet 0  . SUMAtriptan (IMITREX) 50 MG tablet TAKE 0.5-1 TABLET BY MOUTH DAILY AS NEEDED FOR MIGRAINE. 10 tablet 11   No current facility-administered medications for this visit.    OBJECTIVE: There were no vitals filed for this visit.   There is no height or weight on file to calculate BMI.    ECOG FS:0 - Asymptomatic  General: Well-developed, well-nourished, no acute distress. Eyes: Pink conjunctiva, anicteric sclera. HEENT: Normocephalic, moist mucous membranes. Lungs: Clear to auscultation bilaterally. Heart: Regular rate and rhythm. No rubs,  murmurs, or gallops. Abdomen: Soft, nontender, nondistended. No organomegaly noted, normoactive bowel sounds. Musculoskeletal: No edema, cyanosis, or clubbing. Neuro: Alert, answering all questions appropriately. Cranial nerves grossly intact. Skin: No rashes or petechiae noted. Psych: Normal affect.  LAB RESULTS:  Lab Results  Component Value Date   NA 140 12/03/2018   K 4.1 12/03/2018   CL 105 12/03/2018   CO2 29 12/03/2018   GLUCOSE 94 12/03/2018   BUN 13 12/03/2018   CREATININE 0.61 12/03/2018   CALCIUM 9.5 12/03/2018   PROT 6.3 12/03/2018   ALBUMIN 4.0 12/03/2018   AST 29 12/03/2018   ALT 18 12/03/2018   ALKPHOS 75 12/03/2018   BILITOT 0.4 12/03/2018   GFRNONAA >60 10/05/2017   GFRAA >60 10/05/2017    Lab Results  Component Value Date   WBC 7.3 11/23/2018   NEUTROABS 5.3 11/23/2018   HGB 11.5 (L) 11/23/2018   HCT 35.0 (L) 11/23/2018   MCV 90.7 11/23/2018   PLT 289 11/23/2018   Lab Results  Component Value Date   IRON 99 11/23/2018   TIBC 307 11/23/2018   IRONPCTSAT 32 (H) 11/23/2018   Lab Results  Component Value Date   FERRITIN 439 (H) 11/23/2018     STUDIES: No results found.  ASSESSMENT: Iron deficiency anemia.  PLAN:  1. Iron deficiency anemia: Patient underwent colonoscopy and upper endoscopy on May 20, 2016  that did not reveal a distinct etiology.  Video endoscopy on July 05, 2017 revealed a possible site of bleeding and an ulceration in her small intestine.  Patient's hemoglobin has significantly improved to 11.5 since receiving IV Feraheme on November 09, 2018.  This will likely continue to increase.  Repeat iron stores are pending at time of dictation.  She does not require additional IV Feraheme.  Return to clinic in 3 months with repeat laboratory work, further evaluation, and additional treatment if necessary. 2.  Weakness and fatigue: Unlikely related to patient's iron deficiency anemia.  She reports that she is being evaluated by her primary care physician for her fibromyalgia.   I spent a total of 20 minutes face-to-face with the patient of which greater than 50% of the visit was spent in counseling and coordination of care as detailed above.   Patient expressed understanding and was in agreement with this plan. She also understands that She can call clinic at any time with any questions, concerns, or complaints.    Lloyd Huger, MD   02/24/2019 10:24 AM

## 2019-02-28 ENCOUNTER — Other Ambulatory Visit: Payer: Self-pay | Admitting: Emergency Medicine

## 2019-02-28 ENCOUNTER — Telehealth: Payer: Self-pay | Admitting: Internal Medicine

## 2019-02-28 DIAGNOSIS — J029 Acute pharyngitis, unspecified: Secondary | ICD-10-CM

## 2019-02-28 DIAGNOSIS — D509 Iron deficiency anemia, unspecified: Secondary | ICD-10-CM

## 2019-02-28 NOTE — Telephone Encounter (Signed)
I don't know if she needs a referral-can you find out? And if so, what is she going there for?

## 2019-02-28 NOTE — Telephone Encounter (Signed)
Patietn stated that she has an upcoming appointment at [facility]  She called there office and they advised her that our office may require her to have a referral to be seen. Does she need this? If so can you send in a referral to that office  [place]

## 2019-02-28 NOTE — Telephone Encounter (Signed)
I just put in the referral so send them a record/copy of it Thanks!

## 2019-02-28 NOTE — Telephone Encounter (Signed)
Pt called [facility] directly and scheduled an appt for a sore throat/problem w/her throat.  Their office asked her if she needed a referral for her [employer].  Pt stated she didn't know if she needed one.  They advised her to call her insurance company and if so she needed on to call our office.  Their office is calling pt back.

## 2019-03-01 ENCOUNTER — Inpatient Hospital Stay: Payer: Medicare HMO

## 2019-03-01 ENCOUNTER — Inpatient Hospital Stay: Payer: Medicare HMO | Admitting: Oncology

## 2019-03-01 DIAGNOSIS — D509 Iron deficiency anemia, unspecified: Secondary | ICD-10-CM

## 2019-03-04 ENCOUNTER — Other Ambulatory Visit: Payer: Self-pay | Admitting: Unknown Physician Specialty

## 2019-03-04 DIAGNOSIS — R1314 Dysphagia, pharyngoesophageal phase: Secondary | ICD-10-CM

## 2019-03-12 ENCOUNTER — Ambulatory Visit: Payer: Medicare HMO

## 2019-03-25 ENCOUNTER — Other Ambulatory Visit: Payer: Self-pay

## 2019-03-25 ENCOUNTER — Ambulatory Visit (INDEPENDENT_AMBULATORY_CARE_PROVIDER_SITE_OTHER): Payer: Medicare HMO | Admitting: Internal Medicine

## 2019-03-25 ENCOUNTER — Encounter: Payer: Self-pay | Admitting: Internal Medicine

## 2019-03-25 DIAGNOSIS — B359 Dermatophytosis, unspecified: Secondary | ICD-10-CM | POA: Diagnosis not present

## 2019-03-25 DIAGNOSIS — F1120 Opioid dependence, uncomplicated: Secondary | ICD-10-CM | POA: Diagnosis not present

## 2019-03-25 DIAGNOSIS — Z7189 Other specified counseling: Secondary | ICD-10-CM | POA: Diagnosis not present

## 2019-03-25 DIAGNOSIS — M797 Fibromyalgia: Secondary | ICD-10-CM | POA: Diagnosis not present

## 2019-03-25 DIAGNOSIS — Z0000 Encounter for general adult medical examination without abnormal findings: Secondary | ICD-10-CM

## 2019-03-25 DIAGNOSIS — F1320 Sedative, hypnotic or anxiolytic dependence, uncomplicated: Secondary | ICD-10-CM

## 2019-03-25 DIAGNOSIS — F39 Unspecified mood [affective] disorder: Secondary | ICD-10-CM | POA: Diagnosis not present

## 2019-03-25 DIAGNOSIS — M79645 Pain in left finger(s): Secondary | ICD-10-CM

## 2019-03-25 DIAGNOSIS — I700 Atherosclerosis of aorta: Secondary | ICD-10-CM | POA: Diagnosis not present

## 2019-03-25 DIAGNOSIS — K219 Gastro-esophageal reflux disease without esophagitis: Secondary | ICD-10-CM

## 2019-03-25 MED ORDER — OMEPRAZOLE 20 MG PO CPDR: 20.0000 mg | DELAYED_RELEASE_CAPSULE | Freq: Two times a day (BID) | ORAL | 3 refills | Status: DC

## 2019-03-25 MED ORDER — KETOCONAZOLE 2 % EX CREA: 1.0000 "application " | TOPICAL_CREAM | Freq: Two times a day (BID) | CUTANEOUS | 1 refills | Status: DC | PRN

## 2019-03-25 MED ORDER — CYCLOBENZAPRINE HCL 5 MG PO TABS: 5.0000 mg | ORAL_TABLET | Freq: Every evening | ORAL | 0 refills | Status: DC | PRN

## 2019-03-25 NOTE — Assessment & Plan Note (Signed)
Tried to cut back on PPI--but then got swallowing problems again Saw Dr Page Spiro put back on twice a day

## 2019-03-25 NOTE — Assessment & Plan Note (Signed)
PDMP reviewed No concerns 

## 2019-03-25 NOTE — Patient Instructions (Signed)
Try miralax 1/2-1 capful daily or every other day to regulate your bowels.

## 2019-03-25 NOTE — Assessment & Plan Note (Signed)
Swelling in PIP No real synovitis Discussed it looks like arthritis

## 2019-03-25 NOTE — Assessment & Plan Note (Signed)
See social history °Has DNR °

## 2019-03-25 NOTE — Assessment & Plan Note (Signed)
Found on CT scan in 2019 Intolerant of statins and side effects with zetia too

## 2019-03-25 NOTE — Assessment & Plan Note (Signed)
I have personally reviewed the Medicare Annual Wellness questionnaire and have noted 1. The patient's medical and social history 2. Their use of alcohol, tobacco or illicit drugs 3. Their current medications and supplements 4. The patient's functional ability including ADL's, fall risks, home safety risks and hearing or visual             impairment. 5. Diet and physical activities 6. Evidence for depression or mood disorders  The patients weight, height, BMI and visual acuity have been recorded in the chart I have made referrals, counseling and provided education to the patient based review of the above and I have provided the pt with a written personalized care plan for preventive services.  I have provided you with a copy of your personalized plan for preventive services. Please take the time to review along with your updated medication list.  Annual flu vaccine Recommended COVID vaccine--she doesn't want Done with colons---normal 3 years ago Mammogram again in 12-18 months Td if any injury No shingrix --- due to cost

## 2019-03-25 NOTE — Assessment & Plan Note (Signed)
Has red area on chest for a couple of months Using alcohol and peroxide and neosporin Has worsened  Not really palpable Looks like ringworm--will give antifungal

## 2019-03-25 NOTE — Assessment & Plan Note (Signed)
Chronic anxiety and dysthymia Related to her disability with fibro Sleep an issue as well Will try to reserve the alprazolam for her panic--try the flexeril (with care) at bedtime

## 2019-03-25 NOTE — Assessment & Plan Note (Signed)
Ongoing fairly significant disability with this Uses the hydrocodone regularly Ongoing sleep disturbance is a big part of it Did feel the cyclobenzaprine helped--but ran out

## 2019-03-25 NOTE — Progress Notes (Signed)
Subjective:    Patient ID: Lindsey Ward, female    DOB: 1950/05/21, 69 y.o.   MRN: [id number]  HPI Here for Medicare wellness visit and follow up of chronic health conditions  This visit occurred during the SARS-CoV-2 public health emergency.  Safety protocols were in place, including screening questions prior to the visit, additional usage of staff PPE, and extensive cleaning of exam room while observing appropriate contact time as indicated for disinfecting solutions.   Reviewed form and advanced directives Reviewed other doctors No alcohol or tobacco Not able to exercise "with this body Vision seems fine Hearing is okay--does have buzzing No falls Chronic mood issues Independent with instrumental ADLs Some minor memory issues  Ongoing pain with the fibromyalgia Will keep her in bed many days Does get some relief from acetaminophen Uses the hydrocodone when pain is bad Still doesn't sleep well Uses the xanax for that --and for her anxiety Some depressed mood--- maybe every other day  Now with left thumb pain also Spot on chest Reviewed aortic atherosclerosis  Current Outpatient Medications on File Prior to Visit  Medication Sig Dispense Refill  . ALPRAZolam (XANAX) 1 MG tablet TAKE 1 TABLET (1 MG TOTAL) BY MOUTH 3 (THREE) TIMES DAILY AS NEEDED. 90 tablet 0  . cetirizine (ZYRTEC) 10 MG tablet TAKE 1 TABLET BY MOUTH EVERY DAY 30 tablet 11  . cyclobenzaprine (FLEXERIL) 5 MG tablet Take 1 tablet (5 mg total) by mouth at bedtime as needed for muscle spasms. 30 tablet 0  . HYDROcodone-acetaminophen (NORCO/VICODIN) 5-325 MG tablet Take 1 tablet by mouth 3 (three) times daily as needed. Dx Code M79.7 90 tablet 0  . omeprazole (PRILOSEC) 20 MG capsule TAKE 1 CAPSULE BY MOUTH TWICE A DAY 180 capsule 3  . predniSONE (DELTASONE) 20 MG tablet Take 2 tablets (40 mg total) by mouth daily. 10 tablet 0  . promethazine (PHENERGAN) 12.5 MG tablet Take 1-2 tablets (12.5-25 mg total) by  mouth 3 (three) times daily as needed for nausea or vomiting. 60 tablet 0  . SUMAtriptan (IMITREX) 50 MG tablet TAKE 0.5-1 TABLET BY MOUTH DAILY AS NEEDED FOR MIGRAINE. 10 tablet 11   No current facility-administered medications on file prior to visit.    Allergies  Allergen Reactions  . Tizanidine Other (See Comments)    "throws me in a different world"  . Aspirin     REACTION: GI bleed  . Buspirone Hcl     REACTION: unspecified  . Ciprofloxacin     REACTION: unspecified  . Citalopram Other (See Comments)    Ear ringing.   . Codeine   . Diazepam   . Duloxetine     REACTION: sz like activity  . Ibuprofen     REACTION: GI bleed  . Imipramine Hcl Other (See Comments)    Ear ringing.   . Oxycodone-Acetaminophen     REACTION: unspecified  . Pregabalin     REACTION: kept her up and confusion  . Propoxyphene Hcl   . Risperidone     REACTION: confusion  . Sulfonamide Derivatives     REACTION: unspecified  . Tramadol Hcl     REACTION: doesn't remember what happened but couldn't tolerate  . Tramadol Hcl Other (See Comments)    REACTION: doesn't remember what happened but couldn't tolerate REACTION: doesn't remember what happened but couldn't tolerate REACTION: doesn't remember what happened but couldn't tolerate   . Venlafaxine     REACTION: unspecified  . Methocarbamol Rash  Past Medical History:  Diagnosis Date  . Bowel obstruction ([place])   . Chronic fatigue   . Depression   . Fibromyalgia   . GERD (gastroesophageal reflux disease)   . Hyperlipemia   . Migraine   . Obstipation   . Osteopenia   . PUD (peptic ulcer disease)     Past Surgical History:  Procedure Laterality Date  . ABDOMINAL SURGERY    . APPENDECTOMY  2005  . BREAST BIOPSY Bilateral 1999   rt w/out clip - neg, lt w/clip - neg  . BREAST CYST ASPIRATION Right 2003   neg  . CHOLECYSTECTOMY  2004  . COLONOSCOPY WITH PROPOFOL N/A 06/09/2016   Procedure: COLONOSCOPY WITH PROPOFOL;  Surgeon: Jonathon Bellows, MD;  Location: [facility] ENDOSCOPY;  Service: Endoscopy;  Laterality: N/A;  . ESOPHAGOGASTRODUODENOSCOPY (EGD) WITH PROPOFOL N/A 06/09/2016   Procedure: ESOPHAGOGASTRODUODENOSCOPY (EGD) WITH PROPOFOL;  Surgeon: Jonathon Bellows, MD;  Location: [facility] ENDOSCOPY;  Service: Endoscopy;  Laterality: N/A;  . EYE SURGERY    . GIVENS CAPSULE STUDY N/A 06/23/2016   Procedure: GIVENS CAPSULE STUDY;  Surgeon: Jonathon Bellows, MD;  Location: [facility] ENDOSCOPY;  Service: Endoscopy;  Laterality: N/A;  . PARS PLANA VITRECTOMY W/ REPAIR OF MACULAR HOLE Left 01/2017   [facility]  . TOTAL ABDOMINAL HYSTERECTOMY W/ BILATERAL SALPINGOOPHORECTOMY  1986    Family History  Problem Relation Age of Onset  . Heart disease Mother        AMI  . Stroke Mother   . Heart disease Father   . Hypertension Sister   . Breast cancer Sister   . Hypertension Sister   . Heart disease Sister        MI  . Diabetes Sister   . Kidney cancer Neg Hx   . Kidney disease Neg Hx   . Prostate cancer Neg Hx     Social History   Socioeconomic History  . Marital status: Single    Spouse name: Not on file  . Number of children: 1  . Years of education: Not on file  . Highest education level: Not on file  Occupational History  . Occupation: DISABLED  Tobacco Use  . Smoking status: Former Smoker    Packs/day: 1.00    Years: 40.00    Pack years: 40.00    Types: Cigarettes    Quit date: 09/25/2013    Years since quitting: 5.4  . Smokeless tobacco: Never Used  . Tobacco comment:    Substance and Sexual Activity  . Alcohol use: Not Currently    Alcohol/week: 0.0 standard drinks    Comment: rarely  . Drug use: No  . Sexual activity: Not on file  Other Topics Concern  . Not on file  Social History Narrative   Living alone again      No living will   Daughter should make health care decisions   Requests DNR---done 10/24/14   No tube feeds if cognitively unaware   Highest level of education: 9th   Social Determinants of Health   Financial  Resource Strain:   . Difficulty of Paying Living Expenses: Not on file  Food Insecurity:   . Worried About Charity fundraiser in the Last Year: Not on file  . Ran Out of Food in the Last Year: Not on file  Transportation Needs:   . Lack of Transportation (Medical): Not on file  . Lack of Transportation (Non-Medical): Not on file  Physical Activity:   . Days of Exercise per Week: Not  on file  . Minutes of Exercise per Session: Not on file  Stress:   . Feeling of Stress : Not on file  Social Connections:   . Frequency of Communication with Friends and Family: Not on file  . Frequency of Social Gatherings with Friends and Family: Not on file  . Attends Religious Services: Not on file  . Active Member of Clubs or Organizations: Not on file  . Attends Archivist Meetings: Not on file  . Marital Status: Not on file  Intimate Partner Violence:   . Fear of Current or Ex-Partner: Not on file  . Emotionally Abused: Not on file  . Physically Abused: Not on file  . Sexually Abused: Not on file    Review of Systems Appetite is fair Weight up slightly for holidays Wears seat belt Teeth okay Bowels stay slow--- uses meds at times (but then it goes too much) Voids slow at times No other skin issues No chest pain Feels heart "thumping" at times---nothing significant No SOB Gets dizzy at times--upon first standing. No syncope Nausea at night    Objective:   Physical Exam  Constitutional: She is oriented to person, place, and time. She appears well-developed. No distress.  HENT:  Mouth/Throat: No oropharyngeal exudate.  Neck: No thyromegaly present.  Cardiovascular: Normal rate, regular rhythm, normal heart sounds and intact distal pulses. Exam reveals no gallop.  No murmur heard. Respiratory: Effort normal and breath sounds normal. No respiratory distress. She has no wheezes. She has no rales.  GI: Soft. There is no abdominal tenderness.  Musculoskeletal:        General:  No tenderness or edema.  Lymphadenopathy:    She has no cervical adenopathy.  Neurological: She is alert and oriented to person, place, and time.  President--- "Milinda Pointer, Obama, Bush" 100-93-? D-l-r-[id number]-w Recall 3/3  Skin: No erythema.  Psychiatric: Her behavior is normal.  Mild anxiety           Assessment & Plan:

## 2019-03-25 NOTE — Assessment & Plan Note (Signed)
Reviewed PDMP No concerns 

## 2019-03-29 NOTE — Progress Notes (Deleted)
[facility]  [phone] [phone]  ID: Lindsey Ward OB: September 03, 1950  MR#: [id number]  [id number]  Patient Care Team: Venia Carbon, MD as PCP - General  CHIEF COMPLAINT: Iron deficiency anemia  INTERVAL HISTORY: Patient returns to clinic today for repeat laboratory work and further evaluation.  She was seen less than a month ago and was mistakenly scheduled for 3 weeks rather than 3 months.  Patient states her 2 infusions of iron "did nothing" and she still feels significantly weak and fatigue.  She does not complain of pain today.  She has no neurologic complaints. She denies any recent fevers or illnesses. She has a good appetite and denies weight loss.  She denies any chest pain, shortness of breath, cough, or hemoptysis.  She denies any nausea, vomiting, constipation, or diarrhea. She denies any melena or hematochezia. She has no urinary complaints.  Patient offers no further specific complaints today.  REVIEW OF SYSTEMS:   Review of Systems  Constitutional: Positive for malaise/fatigue. Negative for fever and weight loss.  Respiratory: Negative.  Negative for cough.   Cardiovascular: Negative.  Negative for chest pain and leg swelling.  Gastrointestinal: Negative.  Negative for abdominal pain, blood in stool and melena.  Genitourinary: Negative.  Negative for hematuria.  Musculoskeletal: Negative.  Negative for myalgias.  Skin: Negative.  Negative for rash.  Neurological: Positive for weakness. Negative for focal weakness and headaches.  Psychiatric/Behavioral: Negative.  The patient is not nervous/anxious.     As per HPI. Otherwise, a complete review of systems is negative.  PAST MEDICAL HISTORY: Past Medical History:  Diagnosis Date  . Bowel obstruction ([place])   . Chronic fatigue   . Depression   . Fibromyalgia   . GERD (gastroesophageal reflux disease)   . Hyperlipemia   . Migraine   . Obstipation   . Osteopenia   . PUD  (peptic ulcer disease)     PAST SURGICAL HISTORY: Past Surgical History:  Procedure Laterality Date  . ABDOMINAL SURGERY    . APPENDECTOMY  2005  . BREAST BIOPSY Bilateral 1999   rt w/out clip - neg, lt w/clip - neg  . BREAST CYST ASPIRATION Right 2003   neg  . CHOLECYSTECTOMY  2004  . COLONOSCOPY WITH PROPOFOL N/A 06/09/2016   Procedure: COLONOSCOPY WITH PROPOFOL;  Surgeon: Jonathon Bellows, MD;  Location: [facility] ENDOSCOPY;  Service: Endoscopy;  Laterality: N/A;  . ESOPHAGOGASTRODUODENOSCOPY (EGD) WITH PROPOFOL N/A 06/09/2016   Procedure: ESOPHAGOGASTRODUODENOSCOPY (EGD) WITH PROPOFOL;  Surgeon: Jonathon Bellows, MD;  Location: [facility] ENDOSCOPY;  Service: Endoscopy;  Laterality: N/A;  . EYE SURGERY    . GIVENS CAPSULE STUDY N/A 06/23/2016   Procedure: GIVENS CAPSULE STUDY;  Surgeon: Jonathon Bellows, MD;  Location: [facility] ENDOSCOPY;  Service: Endoscopy;  Laterality: N/A;  . PARS PLANA VITRECTOMY W/ REPAIR OF MACULAR HOLE Left 01/2017   [facility]  . TOTAL ABDOMINAL HYSTERECTOMY W/ BILATERAL SALPINGOOPHORECTOMY  1986    FAMILY HISTORY: Family History  Problem Relation Age of Onset  . Heart disease Mother        AMI  . Stroke Mother   . Heart disease Father   . Hypertension Sister   . Breast cancer Sister   . Hypertension Sister   . Heart disease Sister        MI  . Diabetes Sister   . Kidney cancer Neg Hx   . Kidney disease Neg Hx   . Prostate cancer Neg Hx     ADVANCED DIRECTIVES (  Y/N):  N  HEALTH MAINTENANCE: Social History   Tobacco Use  . Smoking status: Former Smoker    Packs/day: 1.00    Years: 40.00    Pack years: 40.00    Types: Cigarettes    Quit date: 09/25/2013    Years since quitting: 5.5  . Smokeless tobacco: Never Used  . Tobacco comment:    Substance Use Topics  . Alcohol use: Not Currently    Alcohol/week: 0.0 standard drinks    Comment: rarely  . Drug use: No     Colonoscopy:  PAP:  Bone density:  Lipid panel:  Allergies  Allergen Reactions  . Tizanidine Other  (See Comments)    "throws me in a different world"  . Aspirin     REACTION: GI bleed  . Buspirone Hcl     REACTION: unspecified  . Ciprofloxacin     REACTION: unspecified  . Citalopram Other (See Comments)    Ear ringing.   . Codeine   . Diazepam   . Duloxetine     REACTION: sz like activity  . Ibuprofen     REACTION: GI bleed  . Imipramine Hcl Other (See Comments)    Ear ringing.   . Oxycodone-Acetaminophen     REACTION: unspecified  . Pregabalin     REACTION: kept her up and confusion  . Propoxyphene Hcl   . Risperidone     REACTION: confusion  . Sulfonamide Derivatives     REACTION: unspecified  . Tramadol Hcl     REACTION: doesn't remember what happened but couldn't tolerate  . Tramadol Hcl Other (See Comments)    REACTION: doesn't remember what happened but couldn't tolerate REACTION: doesn't remember what happened but couldn't tolerate REACTION: doesn't remember what happened but couldn't tolerate   . Venlafaxine     REACTION: unspecified  . Methocarbamol Rash    Current Outpatient Medications  Medication Sig Dispense Refill  . ALPRAZolam (XANAX) 1 MG tablet TAKE 1 TABLET (1 MG TOTAL) BY MOUTH 3 (THREE) TIMES DAILY AS NEEDED. 90 tablet 0  . cetirizine (ZYRTEC) 10 MG tablet TAKE 1 TABLET BY MOUTH EVERY DAY 30 tablet 11  . cyclobenzaprine (FLEXERIL) 5 MG tablet Take 1 tablet (5 mg total) by mouth at bedtime as needed for muscle spasms. 30 tablet 0  . HYDROcodone-acetaminophen (NORCO/VICODIN) 5-325 MG tablet Take 1 tablet by mouth 3 (three) times daily as needed. Dx Code M79.7 90 tablet 0  . ketoconazole (NIZORAL) 2 % cream Apply 1 application topically 2 (two) times daily as needed for irritation. 60 g 1  . omeprazole (PRILOSEC) 20 MG capsule Take 1 capsule (20 mg total) by mouth 2 (two) times daily. 180 capsule 3  . predniSONE (DELTASONE) 20 MG tablet Take 2 tablets (40 mg total) by mouth daily. 10 tablet 0  . promethazine (PHENERGAN) 12.5 MG tablet Take 1-2  tablets (12.5-25 mg total) by mouth 3 (three) times daily as needed for nausea or vomiting. 60 tablet 0  . SUMAtriptan (IMITREX) 50 MG tablet TAKE 0.5-1 TABLET BY MOUTH DAILY AS NEEDED FOR MIGRAINE. 10 tablet 11   No current facility-administered medications for this visit.    OBJECTIVE: There were no vitals filed for this visit.   There is no height or weight on file to calculate BMI.    ECOG FS:0 - Asymptomatic  General: Well-developed, well-nourished, no acute distress. Eyes: Pink conjunctiva, anicteric sclera. HEENT: Normocephalic, moist mucous membranes. Lungs: Clear to auscultation bilaterally. Heart: Regular rate and rhythm. No  rubs, murmurs, or gallops. Abdomen: Soft, nontender, nondistended. No organomegaly noted, normoactive bowel sounds. Musculoskeletal: No edema, cyanosis, or clubbing. Neuro: Alert, answering all questions appropriately. Cranial nerves grossly intact. Skin: No rashes or petechiae noted. Psych: Normal affect.  LAB RESULTS:  Lab Results  Component Value Date   NA 140 12/03/2018   K 4.1 12/03/2018   CL 105 12/03/2018   CO2 29 12/03/2018   GLUCOSE 94 12/03/2018   BUN 13 12/03/2018   CREATININE 0.61 12/03/2018   CALCIUM 9.5 12/03/2018   PROT 6.3 12/03/2018   ALBUMIN 4.0 12/03/2018   AST 29 12/03/2018   ALT 18 12/03/2018   ALKPHOS 75 12/03/2018   BILITOT 0.4 12/03/2018   GFRNONAA >60 10/05/2017   GFRAA >60 10/05/2017    Lab Results  Component Value Date   WBC 7.3 11/23/2018   NEUTROABS 5.3 11/23/2018   HGB 11.5 (L) 11/23/2018   HCT 35.0 (L) 11/23/2018   MCV 90.7 11/23/2018   PLT 289 11/23/2018   Lab Results  Component Value Date   IRON 99 11/23/2018   TIBC 307 11/23/2018   IRONPCTSAT 32 (H) 11/23/2018   Lab Results  Component Value Date   FERRITIN 439 (H) 11/23/2018     STUDIES: No results found.  ASSESSMENT: Iron deficiency anemia.  PLAN:  1. Iron deficiency anemia: Patient underwent colonoscopy and upper endoscopy on May 20, 2016 that did not reveal a distinct etiology.  Video endoscopy on July 05, 2017 revealed a possible site of bleeding and an ulceration in her small intestine.  Patient's hemoglobin has significantly improved to 11.5 since receiving IV Feraheme on November 09, 2018.  This will likely continue to increase.  Repeat iron stores are pending at time of dictation.  She does not require additional IV Feraheme.  Return to clinic in 3 months with repeat laboratory work, further evaluation, and additional treatment if necessary. 2.  Weakness and fatigue: Unlikely related to patient's iron deficiency anemia.  She reports that she is being evaluated by her primary care physician for her fibromyalgia.   I spent a total of 20 minutes face-to-face with the patient of which greater than 50% of the visit was spent in counseling and coordination of care as detailed above.   Patient expressed understanding and was in agreement with this plan. She also understands that She can call clinic at any time with any questions, concerns, or complaints.    Lloyd Huger, MD   03/29/2019 6:50 AM

## 2019-04-01 ENCOUNTER — Other Ambulatory Visit: Payer: Medicare HMO

## 2019-04-03 ENCOUNTER — Telehealth: Payer: Self-pay | Admitting: Oncology

## 2019-04-03 NOTE — Telephone Encounter (Signed)
Patient called to cancel her appts for tomorrow as she is not feeling well. Patient stated that she would phone back to reschedule. Appts were cancelled and staff informed.

## 2019-04-04 ENCOUNTER — Inpatient Hospital Stay: Payer: Medicare HMO | Admitting: Oncology

## 2019-04-04 ENCOUNTER — Inpatient Hospital Stay: Payer: Medicare HMO

## 2019-04-12 ENCOUNTER — Other Ambulatory Visit: Payer: Self-pay

## 2019-04-12 MED ORDER — ALPRAZOLAM 1 MG PO TABS: 1.0000 mg | ORAL_TABLET | Freq: Three times a day (TID) | ORAL | 0 refills | Status: DC | PRN

## 2019-04-12 NOTE — Telephone Encounter (Signed)
Patient left message asking for refill on Xanax. Last filled on 02/06/2019 #90 with 0 refill LOV 03/25/19 for CPE Last UDS/Contract on 03/22/2018  No future appointments.

## 2019-04-30 IMAGING — MG DIGITAL DIAGNOSTIC UNILATERAL RIGHT MAMMOGRAM WITH TOMO AND CAD
6 series · 6 of 18 positions shown · non-contrast
Comparison: Previous exam(s).

CLINICAL DATA: 67-year-old female for six-month follow-up of RIGHT
breast mass.

EXAM:
DIGITAL DIAGNOSTIC RIGHT MAMMOGRAM WITH CAD AND TOMO
ULTRASOUND RIGHT BREAST

[R MLO synth-2D]
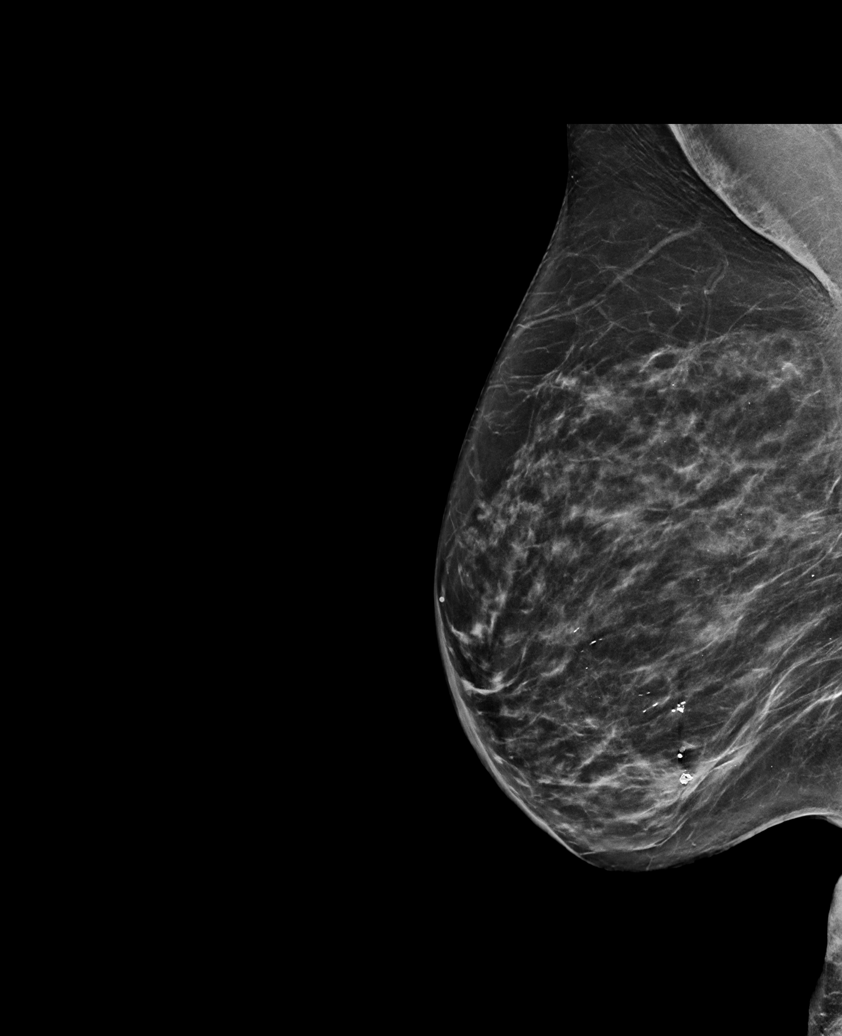

[R CC synth-2D (1 of 2)]
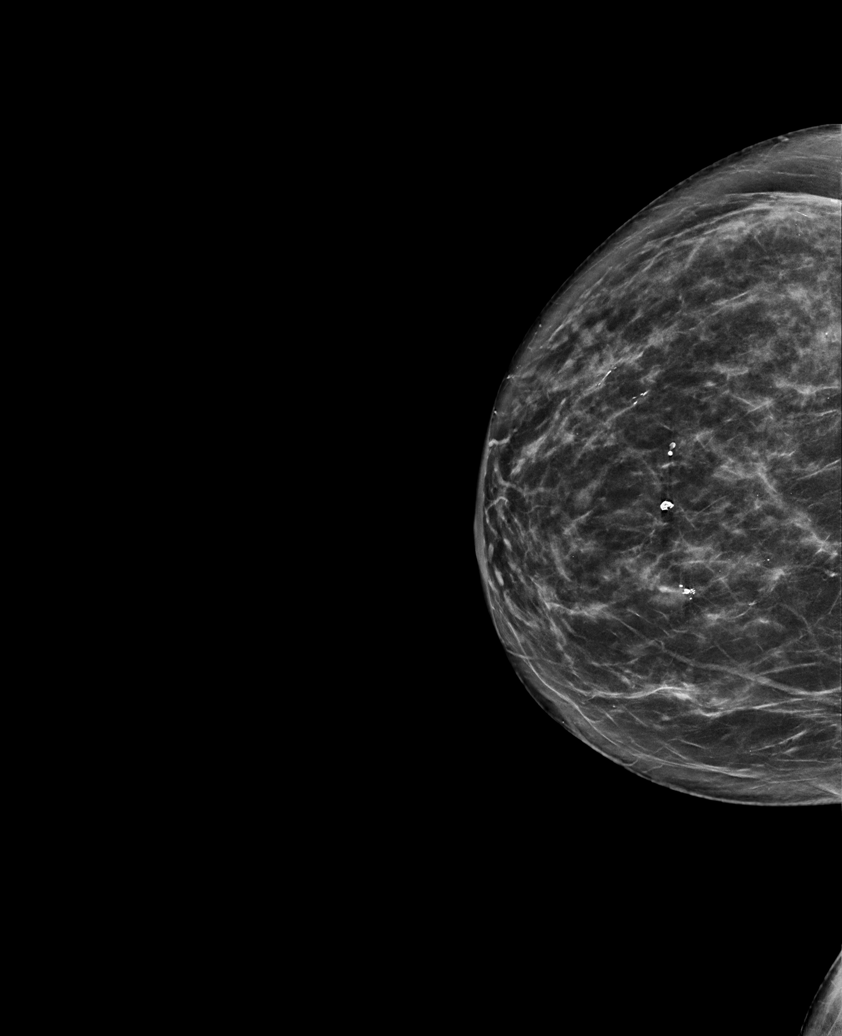

[R CC synth-2D (2 of 2)]
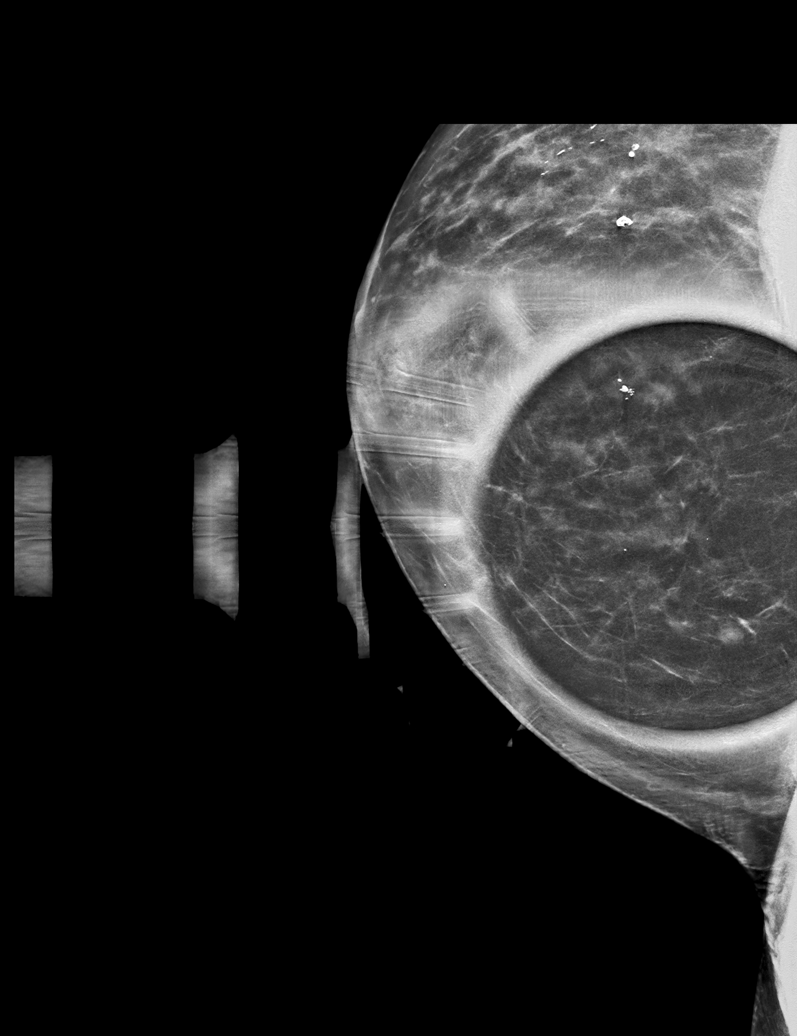

[R CC tomo (1 of 2) · tomo slice 32/63.0]
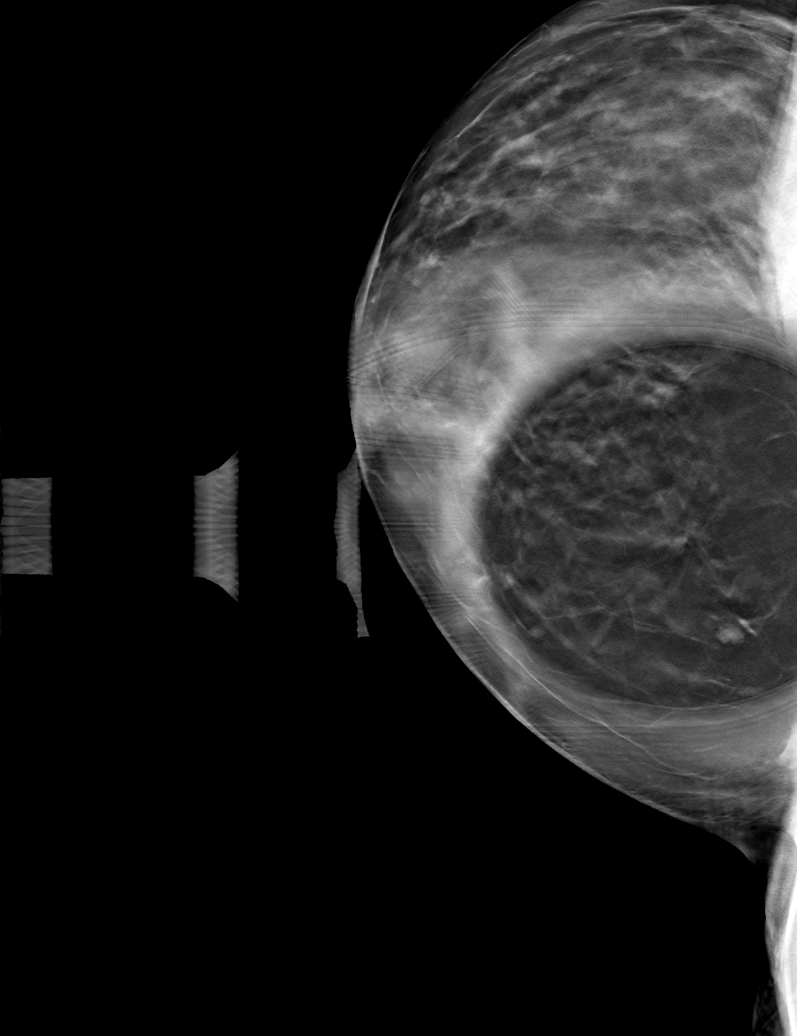

[R CC tomo (2 of 2) · tomo slice 37/73.0]
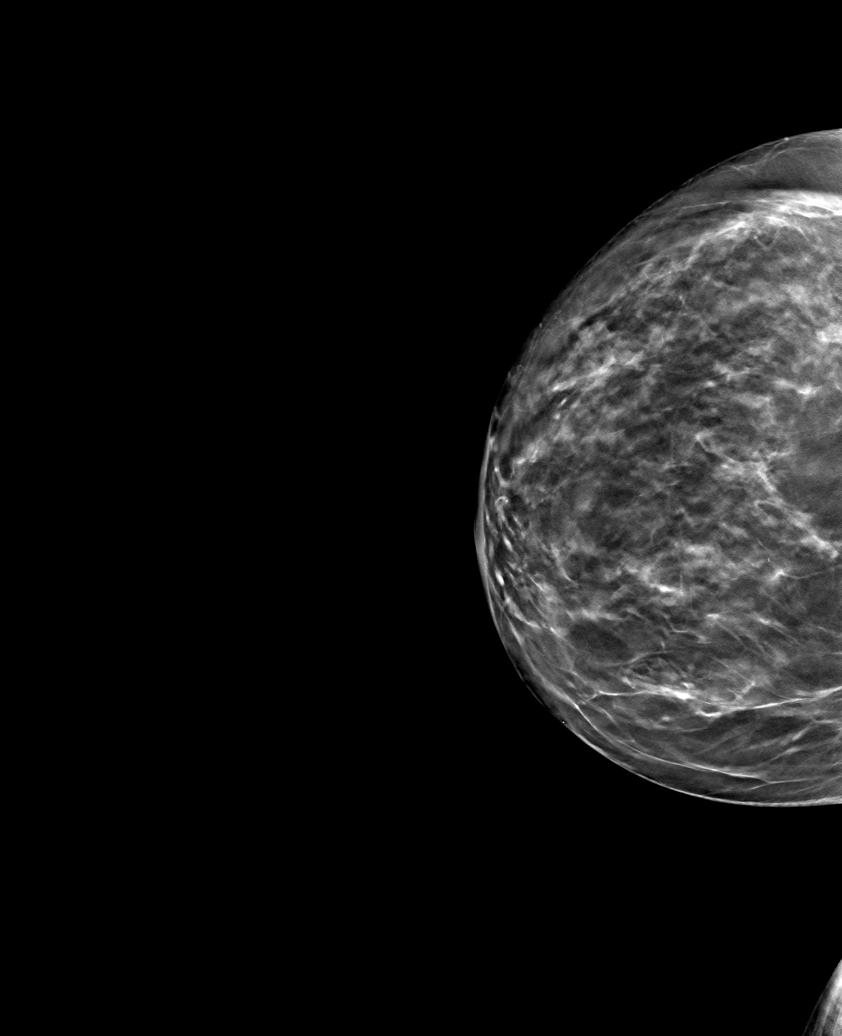

[R MLO tomo · tomo slice 39/77.0]
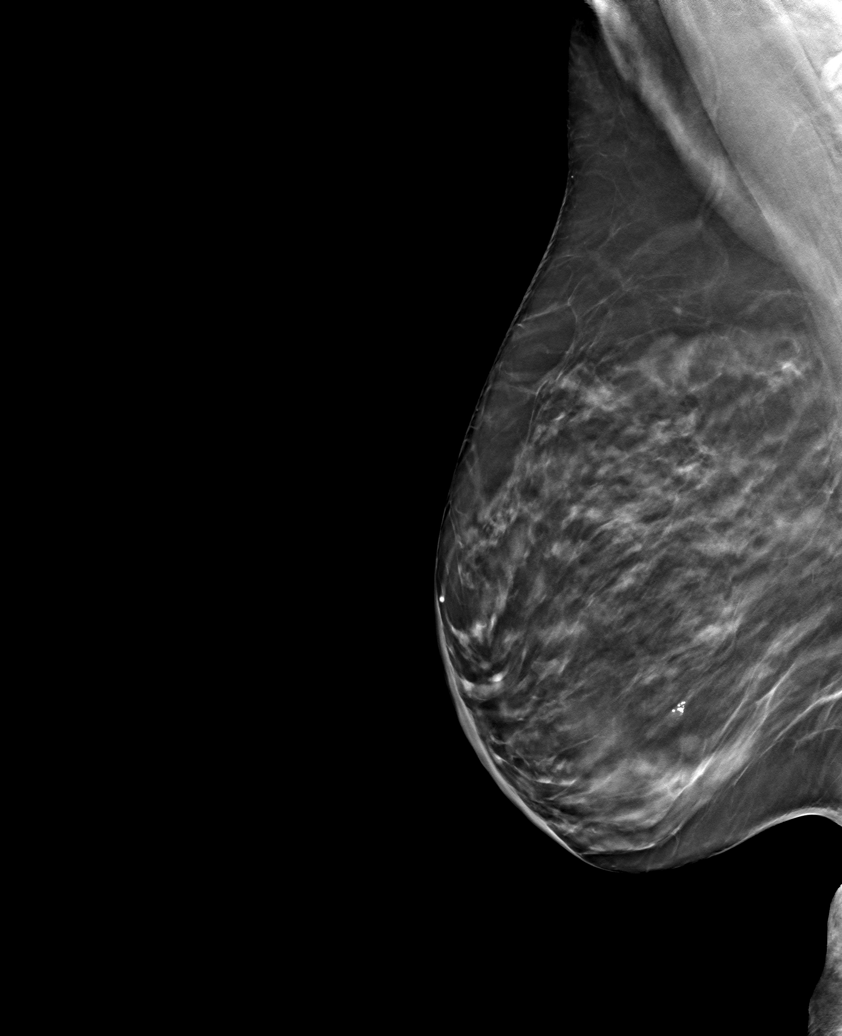

[6 of 18 positions shown; findings below may reference images not displayed]

ACR Breast Density Category b: There are scattered areas of
fibroglandular density.
FINDINGS: 2D/3D full field views of the RIGHT breast demonstrate a stable
circumscribed oval low-density mass within the far posterior UPPER
RIGHT breast.

No new mass, distortion or worrisome calcifications noted.

Mammographic images were processed with CAD.

Targeted ultrasound is performed, showing a stable 6 x 4 x 3 mm
mixed echogenic oval area at the 12 o'clock position of the RIGHT
breast 7 cm from the nipple. No other solid or cystic mass,
distortion or abnormal shadowing is identified within the UPPER or
INNER RIGHT breast.
IMPRESSION: 1. Stable likely benign changes within the posterior UPPER RIGHT
breast. Six-month follow-up recommended to ensure 1 year stability.

RECOMMENDATION:
Bilateral diagnostic mammogram with RIGHT breast ultrasound in 6
months

I have discussed the findings and recommendations with the patient.
Results were also provided in writing at the conclusion of the
visit. If applicable, a reminder letter will be sent to the patient
regarding the next appointment.

BI-RADS CATEGORY  3: Probably benign.

## 2019-05-01 ENCOUNTER — Other Ambulatory Visit: Payer: Self-pay | Admitting: Internal Medicine

## 2019-05-01 ENCOUNTER — Other Ambulatory Visit: Payer: Self-pay | Admitting: *Deleted

## 2019-05-01 MED ORDER — HYDROCODONE-ACETAMINOPHEN 5-325 MG PO TABS: 1.0000 | ORAL_TABLET | Freq: Three times a day (TID) | ORAL | 0 refills | Status: DC | PRN

## 2019-05-01 NOTE — Telephone Encounter (Signed)
Patient left a voicemail requesting a refill on her Hydrocodone Name of Medication: Hydrocodone Name of Pharmacy: [facility] Last Fill or Written Date and Quantity: 01/29/19 #90 Last Office Visit and Type: 03/25/19 Next Office Visit and Type: 09/23/19 Last Controlled Substance Agreement Date: 03/27/18 Last UDS: 03/22/18

## 2019-05-03 ENCOUNTER — Telehealth: Payer: Self-pay

## 2019-05-03 NOTE — Telephone Encounter (Signed)
Completed PA questionnaire on Cover My Meds

## 2019-05-03 NOTE — Telephone Encounter (Signed)
Last filled on 12/05/17 #10 with 11 refills LOV 03/25/2019 follow up  Next appointment on 09/23/2019.  Dr Alla German patient

## 2019-05-06 ENCOUNTER — Other Ambulatory Visit: Payer: Self-pay | Admitting: Internal Medicine

## 2019-05-06 NOTE — Telephone Encounter (Signed)
Hydrocodone approved to the end of the year for more than the 7 day acute supply.

## 2019-05-28 DIAGNOSIS — H02051 Trichiasis without entropian right upper eyelid: Secondary | ICD-10-CM | POA: Diagnosis not present

## 2019-06-04 ENCOUNTER — Telehealth: Payer: Self-pay

## 2019-06-04 ENCOUNTER — Other Ambulatory Visit: Payer: Self-pay

## 2019-06-04 ENCOUNTER — Encounter: Payer: Self-pay | Admitting: Internal Medicine

## 2019-06-04 ENCOUNTER — Ambulatory Visit (INDEPENDENT_AMBULATORY_CARE_PROVIDER_SITE_OTHER): Payer: Medicare Other | Admitting: Internal Medicine

## 2019-06-04 ENCOUNTER — Other Ambulatory Visit: Payer: Self-pay | Admitting: Internal Medicine

## 2019-06-04 VITALS — BP 98/62 | HR 64 | Temp 97.3°F | Ht 63.0 in | Wt 118.0 lb

## 2019-06-04 DIAGNOSIS — F1120 Opioid dependence, uncomplicated: Secondary | ICD-10-CM

## 2019-06-04 DIAGNOSIS — D508 Other iron deficiency anemias: Secondary | ICD-10-CM

## 2019-06-04 DIAGNOSIS — M5441 Lumbago with sciatica, right side: Secondary | ICD-10-CM

## 2019-06-04 DIAGNOSIS — G8929 Other chronic pain: Secondary | ICD-10-CM

## 2019-06-04 LAB — COMPREHENSIVE METABOLIC PANEL
ALT: 14 U/L (ref 0–35)
AST: 24 U/L (ref 0–37)
Albumin: 4 g/dL (ref 3.5–5.2)
Alkaline Phosphatase: 93 U/L (ref 39–117)
BUN: 16 mg/dL (ref 6–23)
CO2: 31 mEq/L (ref 19–32)
Calcium: 9.8 mg/dL (ref 8.4–10.5)
Chloride: 101 mEq/L (ref 96–112)
Creatinine, Ser: 0.84 mg/dL (ref 0.40–1.20)
GFR: 66.88 mL/min (ref >60.00)
Glucose, Bld: 106 mg/dL — ABNORMAL HIGH (ref 70–99)
Potassium: 4.1 mEq/L (ref 3.5–5.1)
Sodium: 138 mEq/L (ref 135–145)
Total Bilirubin: 0.5 mg/dL (ref 0.2–1.2)
Total Protein: 6.3 g/dL (ref 6.0–8.3)

## 2019-06-04 LAB — CBC
HCT: 35.7 % — ABNORMAL LOW (ref 36.0–46.0)
Hemoglobin: 12.5 g/dL (ref 12.0–15.0)
MCHC: 34.9 g/dL (ref 30.0–36.0)
MCV: 93.6 fl (ref 78.0–100.0)
Platelets: 308 10*3/uL (ref 150.0–400.0)
RBC: 3.82 Mil/uL — ABNORMAL LOW (ref 3.87–5.11)
RDW: 12.2 % (ref 11.5–15.5)
WBC: 7.5 10*3/uL (ref 4.0–10.5)

## 2019-06-04 LAB — T4, FREE: Free T4: 0.98 ng/dL (ref 0.60–1.60)

## 2019-06-04 MED ORDER — TRIAMCINOLONE ACETONIDE 0.1 % EX CREA: 1.0000 "application " | TOPICAL_CREAM | Freq: Two times a day (BID) | CUTANEOUS | 1 refills | Status: DC | PRN

## 2019-06-04 MED ORDER — CYCLOBENZAPRINE HCL 5 MG PO TABS: 5.0000 mg | ORAL_TABLET | Freq: Every evening | ORAL | 0 refills | Status: DC | PRN

## 2019-06-04 NOTE — Assessment & Plan Note (Signed)
PDMP reviewed --no concerns 

## 2019-06-04 NOTE — Progress Notes (Signed)
Subjective:    Patient ID: Lindsey Ward, female    DOB: 25-May-1950, 69 y.o.   MRN: [id number]  HPI Here for follow up of fibromyalgia and chronic narcotic/benzo dependence This visit occurred during the SARS-CoV-2 public health emergency.  Safety protocols were in place, including screening questions prior to the visit, additional usage of staff PPE, and extensive cleaning of exam room while observing appropriate contact time as indicated for disinfecting solutions.   Interested in seeing Dr Mike Gip --but hsa been seeing Dr Grayland Ormond  Will feel "like I have to go down" or fall Winds up in the bed all day sometimes Other days she can get up and do more Pain in shoulders/back and down legs at times  Takes the hydrocodone with flares Will take as many as 2 a day---other days none Pain seems to be worsening  Anxiety is worse now Relates to pain and "stuff on my mind" Lost fiancee, sister is ill and "can't go", etc  Current Outpatient Medications on File Prior to Visit  Medication Sig Dispense Refill  . ALPRAZolam (XANAX) 1 MG tablet Take 1 tablet (1 mg total) by mouth 3 (three) times daily as needed. 90 tablet 0  . cetirizine (ZYRTEC) 10 MG tablet TAKE 1 TABLET BY MOUTH EVERY DAY 30 tablet 11  . cyclobenzaprine (FLEXERIL) 5 MG tablet TAKE 1 TABLET (5 MG TOTAL) BY MOUTH AT BEDTIME AS NEEDED FOR MUSCLE SPASMS. 30 tablet 0  . HYDROcodone-acetaminophen (NORCO/VICODIN) 5-325 MG tablet Take 1 tablet by mouth 3 (three) times daily as needed. Dx Code M79.7 90 tablet 0  . ketoconazole (NIZORAL) 2 % cream Apply 1 application topically 2 (two) times daily as needed for irritation. 60 g 1  . omeprazole (PRILOSEC) 20 MG capsule Take 1 capsule (20 mg total) by mouth 2 (two) times daily. 180 capsule 3  . promethazine (PHENERGAN) 12.5 MG tablet Take 1-2 tablets (12.5-25 mg total) by mouth 3 (three) times daily as needed for nausea or vomiting. 60 tablet 0  . SUMAtriptan (IMITREX) 50 MG tablet TAKE  0.5-1 TABLET BY MOUTH DAILY AS NEEDED FOR MIGRAINE. MAX PER INS. 9 tablet 13   No current facility-administered medications on file prior to visit.    Allergies  Allergen Reactions  . Tizanidine Other (See Comments)    "throws me in a different world"  . Aspirin     REACTION: GI bleed  . Buspirone Hcl     REACTION: unspecified  . Ciprofloxacin     REACTION: unspecified  . Citalopram Other (See Comments)    Ear ringing.   . Codeine   . Diazepam   . Duloxetine     REACTION: sz like activity  . Ibuprofen     REACTION: GI bleed  . Imipramine Hcl Other (See Comments)    Ear ringing.   . Oxycodone-Acetaminophen     REACTION: unspecified  . Pregabalin     REACTION: kept her up and confusion  . Propoxyphene Hcl   . Risperidone     REACTION: confusion  . Sulfonamide Derivatives     REACTION: unspecified  . Tramadol Hcl     REACTION: doesn't remember what happened but couldn't tolerate  . Tramadol Hcl Other (See Comments)    REACTION: doesn't remember what happened but couldn't tolerate REACTION: doesn't remember what happened but couldn't tolerate REACTION: doesn't remember what happened but couldn't tolerate   . Venlafaxine     REACTION: unspecified  . Methocarbamol Rash    Past Medical  History:  Diagnosis Date  . Bowel obstruction ([place])   . Chronic fatigue   . Depression   . Fibromyalgia   . GERD (gastroesophageal reflux disease)   . Hyperlipemia   . Migraine   . Obstipation   . Osteopenia   . PUD (peptic ulcer disease)     Past Surgical History:  Procedure Laterality Date  . ABDOMINAL SURGERY    . APPENDECTOMY  2005  . BREAST BIOPSY Bilateral 1999   rt w/out clip - neg, lt w/clip - neg  . BREAST CYST ASPIRATION Right 2003   neg  . CHOLECYSTECTOMY  2004  . COLONOSCOPY WITH PROPOFOL N/A 06/09/2016   Procedure: COLONOSCOPY WITH PROPOFOL;  Surgeon: Jonathon Bellows, MD;  Location: [facility] ENDOSCOPY;  Service: Endoscopy;  Laterality: N/A;  .  ESOPHAGOGASTRODUODENOSCOPY (EGD) WITH PROPOFOL N/A 06/09/2016   Procedure: ESOPHAGOGASTRODUODENOSCOPY (EGD) WITH PROPOFOL;  Surgeon: Jonathon Bellows, MD;  Location: [facility] ENDOSCOPY;  Service: Endoscopy;  Laterality: N/A;  . EYE SURGERY    . GIVENS CAPSULE STUDY N/A 06/23/2016   Procedure: GIVENS CAPSULE STUDY;  Surgeon: Jonathon Bellows, MD;  Location: [facility] ENDOSCOPY;  Service: Endoscopy;  Laterality: N/A;  . PARS PLANA VITRECTOMY W/ REPAIR OF MACULAR HOLE Left 01/2017   [facility]  . TOTAL ABDOMINAL HYSTERECTOMY W/ BILATERAL SALPINGOOPHORECTOMY  1986    Family History  Problem Relation Age of Onset  . Heart disease Mother        AMI  . Stroke Mother   . Heart disease Father   . Hypertension Sister   . Breast cancer Sister   . Hypertension Sister   . Heart disease Sister        MI  . Diabetes Sister   . Kidney cancer Neg Hx   . Kidney disease Neg Hx   . Prostate cancer Neg Hx     Social History   Socioeconomic History  . Marital status: Single    Spouse name: Not on file  . Number of children: 1  . Years of education: Not on file  . Highest education level: Not on file  Occupational History  . Occupation: DISABLED  Tobacco Use  . Smoking status: Former Smoker    Packs/day: 1.00    Years: 40.00    Pack years: 40.00    Types: Cigarettes    Quit date: 09/25/2013    Years since quitting: 5.6  . Smokeless tobacco: Never Used  . Tobacco comment:    Substance and Sexual Activity  . Alcohol use: Not Currently    Alcohol/week: 0.0 standard drinks    Comment: rarely  . Drug use: No  . Sexual activity: Not on file  Other Topics Concern  . Not on file  Social History Narrative   Living alone again      No living will   Daughter should make health care decisions   Requests DNR---done 10/24/14   No tube feeds if cognitively unaware   Highest level of education: 9th   Social Determinants of Health   Financial Resource Strain:   . Difficulty of Paying Living Expenses:   Food Insecurity:    . Worried About Charity fundraiser in the Last Year:   . Arboriculturist in the Last Year:   Transportation Needs:   . Film/video editor (Medical):   Marland Kitchen Lack of Transportation (Non-Medical):   Physical Activity:   . Days of Exercise per Week:   . Minutes of Exercise per Session:   Stress:   .  Feeling of Stress :   Social Connections:   . Frequency of Communication with Friends and Family:   . Frequency of Social Gatherings with Friends and Family:   . Attends Religious Services:   . Active Member of Clubs or Organizations:   . Attends Archivist Meetings:   Marland Kitchen Marital Status:   Intimate Partner Violence:   . Fear of Current or Ex-Partner:   . Emotionally Abused:   Marland Kitchen Physically Abused:   . Sexually Abused:    Review of Systems Sleeps okay-- 6 hours a night.  Variable feeling when she gets up    Objective:   Physical Exam  Constitutional: No distress.  Skin:  Small scaly area on upper right chest---doesn't look like ringworm now  Psychiatric:  No overt depression--more like frustrated           Assessment & Plan:

## 2019-06-04 NOTE — Assessment & Plan Note (Signed)
Has fibromyalgia but also other types of pain Doubt this is related to her anemia--will recheck labs Referral to physiatry though

## 2019-06-04 NOTE — Assessment & Plan Note (Signed)
Will send back to Dr Grayland Ormond if it has gone down again

## 2019-06-04 NOTE — Telephone Encounter (Signed)
[place] Ward - Client Nonclinical Telephone Record AccessNurse Client Lindsey Ward - Client Client Site [place] Physician Viviana Simpler - MD Contact Type Call Who Is Calling Patient / Member / Family / Caregiver Caller Name [place] Phone Number [phone] Call Type Message Only Information Provided Reason for Call Returning a Call from the Office Initial Lindsey Ward states she is returning a call from the office. Disp. Time Disposition Final User 06/03/2019 5:03:41 PM General Information Provided Yes [name] Call Closed By: [name] Transaction Date/Time: 06/03/2019 5:00:08 PM (ET)

## 2019-06-06 ENCOUNTER — Other Ambulatory Visit: Payer: Self-pay | Admitting: Internal Medicine

## 2019-06-06 LAB — PAIN MGMT, PROFILE 8 W/CONF, U
6 Acetylmorphine: NEGATIVE ng/mL
Alcohol Metabolites: NEGATIVE ng/mL (ref <500)
Alphahydroxyalprazolam: 351 ng/mL
Alphahydroxymidazolam: NEGATIVE ng/mL
Alphahydroxytriazolam: NEGATIVE ng/mL
Aminoclonazepam: NEGATIVE ng/mL
Amphetamines: NEGATIVE ng/mL
Benzodiazepines: POSITIVE ng/mL
Buprenorphine, Urine: NEGATIVE ng/mL
Cocaine Metabolite: NEGATIVE ng/mL
Codeine: NEGATIVE ng/mL
Creatinine: 109.9 mg/dL
Hydrocodone: 486 ng/mL
Hydromorphone: 245 ng/mL
Hydroxyethylflurazepam: NEGATIVE ng/mL
Lorazepam: NEGATIVE ng/mL
MDMA: NEGATIVE ng/mL
Marijuana Metabolite: NEGATIVE ng/mL
Morphine: NEGATIVE ng/mL
Nordiazepam: NEGATIVE ng/mL
Norhydrocodone: 681 ng/mL
Opiates: POSITIVE ng/mL
Oxazepam: NEGATIVE ng/mL
Oxidant: NEGATIVE ug/mL
Oxycodone: NEGATIVE ng/mL
Temazepam: NEGATIVE ng/mL
pH: 7.6 (ref 4.5–9.0)

## 2019-06-11 ENCOUNTER — Telehealth: Payer: Self-pay

## 2019-06-11 NOTE — Telephone Encounter (Signed)
Pt last seen on 06/04/19; pt requested lab results and pt notified as instructed and pt voiced understanding. Pt said she is in bed 3-4 days a wk except when gets up to go to bathrrom; pt has no energy and is feeling really tired. This morning pt feeling anxious like her heart is going to beat out of her chest. No CP. Pt is going to take alprazolam 1 mg this morning. Pt is concerned that she is so tired and her back, arms, legs, head hurts all the time and is worsening. The hydrocodone apap helps sometimes but not always. Pt wants to know what Dr Silvio Pate thinks is causing her pain (pt has fibromyalgia) and what can be done. [facility] and pt request cb.

## 2019-06-11 NOTE — Telephone Encounter (Signed)
Spoke to pt. Her appt is in May. Hoping she can make it through until then.

## 2019-06-11 NOTE — Telephone Encounter (Signed)
I think it is a combination of her anxiety and the fibromyalgia but I am concerned that it is worse. That is why I have made the referral to the physiatrist----remind her that should be getting set up even if she didn't hear about it yet

## 2019-06-19 ENCOUNTER — Telehealth: Payer: Self-pay

## 2019-06-19 NOTE — Telephone Encounter (Signed)
Yes---she clearly needs to be seen in ER I will await their evaluation

## 2019-06-19 NOTE — Telephone Encounter (Signed)
Pt said starting on 06/17/19 pt vomited all day until 06/18/19 and then watery diarrhea started and continues until present. Pt has abd swelling from under both breast to bottom of lower abdomen; pt said abd is so swollen it is difficult to walk; pt has to hold her lower abd when trying to walk and pain level now is 9. pts mouth is dry but pt trying to drink gatorade and ginger ale. Pt has had lightheaded on and off. Pt does have abd pain when walking and abd is so swollen it is difficult to walk. Pt last urinated few mins ago but was small amt of urine and urine is darker than usual. Urine also has an odor. No fever. Pt is hurting so badly she cannot drive; pt lives alone but declines 911 and she will find someone to drive her to ED now. FYI to Dr Silvio Pate and Larene Beach CMA.

## 2019-06-20 NOTE — Telephone Encounter (Signed)
Spoke to pt. She said she did not go anywhere because she started feeling better. Diarrhea stopped last night.

## 2019-06-20 NOTE — Telephone Encounter (Signed)
Glad to hear that but she described some concerning symptoms at first. We can just wait and see how she does

## 2019-06-24 ENCOUNTER — Other Ambulatory Visit: Payer: Self-pay | Admitting: Internal Medicine

## 2019-06-24 NOTE — Telephone Encounter (Signed)
Last filled 04-12-19 #90 Last OV 06-04-19 Next OV 09-23-19 [facility]

## 2019-06-25 ENCOUNTER — Other Ambulatory Visit: Payer: Self-pay | Admitting: Internal Medicine

## 2019-06-25 ENCOUNTER — Telehealth: Payer: Self-pay

## 2019-06-25 DIAGNOSIS — N6310 Unspecified lump in the right breast, unspecified quadrant: Secondary | ICD-10-CM

## 2019-06-25 NOTE — Telephone Encounter (Signed)
Pt last had mammogram and [place] of rt breast on 07/30/2018. Pt said she was advised would need order from Dr Silvio Pate to schedule a mammogram. Does pt need to have a diagnostic mammo and [place] of rt breast to [facility]. Pt request cb. Pt has 6 mth FU scheduled on 09/23/19.

## 2019-06-26 NOTE — Telephone Encounter (Signed)
Order placed. She should be able to schedule the tests now herself--since the orders are in

## 2019-06-26 NOTE — Telephone Encounter (Signed)
Left message on VM per DPR. 

## 2019-07-04 ENCOUNTER — Other Ambulatory Visit: Payer: Self-pay | Admitting: Internal Medicine

## 2019-07-11 ENCOUNTER — Other Ambulatory Visit: Payer: Self-pay | Admitting: Internal Medicine

## 2019-07-24 DIAGNOSIS — M797 Fibromyalgia: Secondary | ICD-10-CM | POA: Diagnosis not present

## 2019-07-24 DIAGNOSIS — M5416 Radiculopathy, lumbar region: Secondary | ICD-10-CM | POA: Diagnosis not present

## 2019-07-24 DIAGNOSIS — M5412 Radiculopathy, cervical region: Secondary | ICD-10-CM | POA: Diagnosis not present

## 2019-07-24 DIAGNOSIS — M5136 Other intervertebral disc degeneration, lumbar region: Secondary | ICD-10-CM | POA: Diagnosis not present

## 2019-08-01 ENCOUNTER — Other Ambulatory Visit: Payer: Medicare Other

## 2019-08-04 ENCOUNTER — Other Ambulatory Visit: Payer: Self-pay | Admitting: Internal Medicine

## 2019-08-05 ENCOUNTER — Other Ambulatory Visit: Payer: Self-pay

## 2019-08-05 MED ORDER — HYDROCODONE-ACETAMINOPHEN 5-325 MG PO TABS: 1.0000 | ORAL_TABLET | Freq: Three times a day (TID) | ORAL | 0 refills | Status: DC | PRN

## 2019-08-05 NOTE — Telephone Encounter (Signed)
Refill request Triamcinolone Last office visit 06/04/19 Last office visit 06/04/19  45 G/1 refill

## 2019-08-05 NOTE — Telephone Encounter (Signed)
Patient is requesting a refill on Hydrocodone. This was last refilled on 05/01/19 for #90 with 0 refills. Patient was last seen on 06/04/19 and has an appt on 09/23/19.  Is this ok to refill?

## 2019-08-07 ENCOUNTER — Ambulatory Visit
Admission: RE | Admit: 2019-08-07 | Discharge: 2019-08-07 | Disposition: A | Discharge status: Home / Self Care | Payer: Medicare Other | Source: Ambulatory Visit | Attending: Internal Medicine | Admitting: Internal Medicine

## 2019-08-07 DIAGNOSIS — R922 Inconclusive mammogram: Secondary | ICD-10-CM | POA: Diagnosis not present

## 2019-08-07 DIAGNOSIS — N6315 Unspecified lump in the right breast, overlapping quadrants: Secondary | ICD-10-CM | POA: Insufficient documentation

## 2019-08-07 DIAGNOSIS — N6310 Unspecified lump in the right breast, unspecified quadrant: Secondary | ICD-10-CM

## 2019-08-07 DIAGNOSIS — N6311 Unspecified lump in the right breast, upper outer quadrant: Secondary | ICD-10-CM | POA: Diagnosis not present

## 2019-08-07 DIAGNOSIS — N6312 Unspecified lump in the right breast, upper inner quadrant: Secondary | ICD-10-CM | POA: Diagnosis not present

## 2019-08-21 ENCOUNTER — Other Ambulatory Visit: Payer: Self-pay | Admitting: Internal Medicine

## 2019-08-21 NOTE — Telephone Encounter (Signed)
Last filled 06-24-19 #90 Last OV 06-04-19 Next OV 09-23-19 [facility]

## 2019-09-04 DIAGNOSIS — R490 Dysphonia: Secondary | ICD-10-CM | POA: Diagnosis not present

## 2019-09-04 DIAGNOSIS — K219 Gastro-esophageal reflux disease without esophagitis: Secondary | ICD-10-CM | POA: Diagnosis not present

## 2019-09-23 ENCOUNTER — Encounter: Payer: Self-pay | Admitting: Internal Medicine

## 2019-09-23 ENCOUNTER — Ambulatory Visit (INDEPENDENT_AMBULATORY_CARE_PROVIDER_SITE_OTHER): Payer: Medicare Other | Admitting: Internal Medicine

## 2019-09-23 ENCOUNTER — Other Ambulatory Visit: Payer: Self-pay

## 2019-09-23 DIAGNOSIS — F1120 Opioid dependence, uncomplicated: Secondary | ICD-10-CM

## 2019-09-23 DIAGNOSIS — M797 Fibromyalgia: Secondary | ICD-10-CM | POA: Diagnosis not present

## 2019-09-23 DIAGNOSIS — F39 Unspecified mood [affective] disorder: Secondary | ICD-10-CM

## 2019-09-23 NOTE — Assessment & Plan Note (Signed)
Mostly chronic anxiety Dysthymia not as evident Fairly consistent with the xanax

## 2019-09-23 NOTE — Assessment & Plan Note (Signed)
PDMP reviewed.

## 2019-09-23 NOTE — Progress Notes (Signed)
Subjective:    Patient ID: Lindsey Ward, female    DOB: Jul 13, 1950, 69 y.o.   MRN: [id number]  HPI Here for follow up of chronic pain and anxiety This visit occurred during the SARS-CoV-2 public health emergency.  Safety protocols were in place, including screening questions prior to the visit, additional usage of staff PPE, and extensive cleaning of exam room while observing appropriate contact time as indicated for disinfecting solutions.   Having bad leg pain lately Tried icy hot---some help Back pain and all over at times--from fibromyalgia Uses the hydrocodone prn --on bad days (and skips some days)  Uses the xanax more regularly At least bid and three times some days  She still forgets things Will watch a movie--then forget what it was about Misplaces glasses Doesn't "get it" when people are talking to her (not able to concentrate)  Still grieves sister and fiancee  Current Outpatient Medications on File Prior to Visit  Medication Sig Dispense Refill  . ALPRAZolam (XANAX) 1 MG tablet TAKE 1 TABLET (1 MG TOTAL) BY MOUTH 3 (THREE) TIMES DAILY AS NEEDED. 90 tablet 0  . cetirizine (ZYRTEC) 10 MG tablet TAKE 1 TABLET BY MOUTH EVERY DAY 30 tablet 11  . cyclobenzaprine (FLEXERIL) 5 MG tablet TAKE 1 TABLET (5 MG TOTAL) BY MOUTH AT BEDTIME AS NEEDED FOR MUSCLE SPASMS. 30 tablet 2  . HYDROcodone-acetaminophen (NORCO/VICODIN) 5-325 MG tablet Take 1 tablet by mouth 3 (three) times daily as needed. Dx Code M79.7 90 tablet 0  . ketoconazole (NIZORAL) 2 % cream Apply 1 application topically 2 (two) times daily as needed for irritation. 60 g 1  . omeprazole (PRILOSEC) 20 MG capsule Take 1 capsule (20 mg total) by mouth 2 (two) times daily. 180 capsule 3  . Polyethyl Glycol-Propyl Glycol (SYSTANE) 0.4-0.3 % SOLN Apply to eye.    . promethazine (PHENERGAN) 12.5 MG tablet TAKE 1-2 TABLETS (12.5-25 MG TOTAL) BY MOUTH 3 (THREE) TIMES DAILY AS NEEDED FOR NAUSEA OR VOMITING. 60 tablet 0  .  SUMAtriptan (IMITREX) 50 MG tablet TAKE 0.5-1 TABLET BY MOUTH DAILY AS NEEDED FOR MIGRAINE. MAX PER INS. 9 tablet 13  . triamcinolone cream (KENALOG) 0.1 % APPLY 1 APPLICATION TOPICALLY 2 (TWO) TIMES DAILY AS NEEDED. 45 g 1   No current facility-administered medications on file prior to visit.    Allergies  Allergen Reactions  . Tizanidine Other (See Comments)    "throws me in a different world"  . Aspirin     REACTION: GI bleed  . Buspirone Hcl     REACTION: unspecified  . Ciprofloxacin     REACTION: unspecified  . Citalopram Other (See Comments)    Ear ringing.   . Codeine   . Diazepam   . Duloxetine     REACTION: sz like activity  . Ibuprofen     REACTION: GI bleed  . Imipramine Hcl Other (See Comments)    Ear ringing.   . Oxycodone-Acetaminophen     REACTION: unspecified  . Pregabalin     REACTION: kept her up and confusion  . Propoxyphene Hcl   . Risperidone     REACTION: confusion  . Sulfonamide Derivatives     REACTION: unspecified  . Tramadol Hcl     REACTION: doesn't remember what happened but couldn't tolerate  . Tramadol Hcl Other (See Comments)    REACTION: doesn't remember what happened but couldn't tolerate REACTION: doesn't remember what happened but couldn't tolerate REACTION: doesn't remember what happened but couldn't  tolerate   . Venlafaxine     REACTION: unspecified  . Methocarbamol Rash    Past Medical History:  Diagnosis Date  . Bowel obstruction ([place])   . Chronic fatigue   . Depression   . Fibromyalgia   . GERD (gastroesophageal reflux disease)   . Hyperlipemia   . Migraine   . Obstipation   . Osteopenia   . PUD (peptic ulcer disease)     Past Surgical History:  Procedure Laterality Date  . ABDOMINAL SURGERY    . APPENDECTOMY  2005  . BREAST BIOPSY Bilateral 1999   rt w/out clip - neg, lt w/clip - neg  . BREAST CYST ASPIRATION Right 2003   neg  . CHOLECYSTECTOMY  2004  . COLONOSCOPY WITH PROPOFOL N/A 06/09/2016   Procedure:  COLONOSCOPY WITH PROPOFOL;  Surgeon: Jonathon Bellows, MD;  Location: [facility] ENDOSCOPY;  Service: Endoscopy;  Laterality: N/A;  . ESOPHAGOGASTRODUODENOSCOPY (EGD) WITH PROPOFOL N/A 06/09/2016   Procedure: ESOPHAGOGASTRODUODENOSCOPY (EGD) WITH PROPOFOL;  Surgeon: Jonathon Bellows, MD;  Location: [facility] ENDOSCOPY;  Service: Endoscopy;  Laterality: N/A;  . EYE SURGERY    . GIVENS CAPSULE STUDY N/A 06/23/2016   Procedure: GIVENS CAPSULE STUDY;  Surgeon: Jonathon Bellows, MD;  Location: [facility] ENDOSCOPY;  Service: Endoscopy;  Laterality: N/A;  . PARS PLANA VITRECTOMY W/ REPAIR OF MACULAR HOLE Left 01/2017   [facility]  . TOTAL ABDOMINAL HYSTERECTOMY W/ BILATERAL SALPINGOOPHORECTOMY  1986    Family History  Problem Relation Age of Onset  . Heart disease Mother        AMI  . Stroke Mother   . Heart disease Father   . Hypertension Sister   . Breast cancer Sister   . Hypertension Sister   . Heart disease Sister        MI  . Diabetes Sister   . Kidney cancer Neg Hx   . Kidney disease Neg Hx   . Prostate cancer Neg Hx     Social History   Socioeconomic History  . Marital status: Single    Spouse name: Not on file  . Number of children: 1  . Years of education: Not on file  . Highest education level: Not on file  Occupational History  . Occupation: DISABLED  Tobacco Use  . Smoking status: Former Smoker    Packs/day: 1.00    Years: 40.00    Pack years: 40.00    Types: Cigarettes    Quit date: 09/25/2013    Years since quitting: 5.9  . Smokeless tobacco: Never Used  . Tobacco comment:    Vaping Use  . Vaping Use: Never used  Substance and Sexual Activity  . Alcohol use: Not Currently    Alcohol/week: 0.0 standard drinks    Comment: rarely  . Drug use: No  . Sexual activity: Not on file  Other Topics Concern  . Not on file  Social History Narrative   Living alone again      No living will   Daughter should make health care decisions   Requests DNR---done 10/24/14   No tube feeds if cognitively unaware    Highest level of education: 9th   Social Determinants of Health   Financial Resource Strain:   . Difficulty of Paying Living Expenses:   Food Insecurity:   . Worried About Charity fundraiser in the Last Year:   . Arboriculturist in the Last Year:   Transportation Needs:   . Film/video editor (Medical):   Marland Kitchen  Lack of Transportation (Non-Medical):   Physical Activity:   . Days of Exercise per Week:   . Minutes of Exercise per Session:   Stress:   . Feeling of Stress :   Social Connections:   . Frequency of Communication with Friends and Family:   . Frequency of Social Gatherings with Friends and Family:   . Attends Religious Services:   . Active Member of Clubs or Organizations:   . Attends Archivist Meetings:   Marland Kitchen Marital Status:   Intimate Partner Violence:   . Fear of Current or Ex-Partner:   . Emotionally Abused:   Marland Kitchen Physically Abused:   . Sexually Abused:    Review of Systems  Not eating great---doesn't want to cook (will just pick up a sandwich) Hasn't lost weight Sleeping better than what she was      Objective:   Physical Exam Psychiatric:        Mood and Affect: Mood normal.        Behavior: Behavior normal.            Assessment & Plan:

## 2019-09-23 NOTE — Assessment & Plan Note (Signed)
Chronic pain from this Intermittent hydrocodone Doesn't fill every month by far

## 2019-10-05 ENCOUNTER — Other Ambulatory Visit: Payer: Self-pay | Admitting: Internal Medicine

## 2019-10-07 NOTE — Telephone Encounter (Signed)
Last filled 09-02-19 #30 Last OV 09-23-19 Next OV 01-02-20 [facility]

## 2019-10-14 ENCOUNTER — Other Ambulatory Visit: Payer: Self-pay

## 2019-10-14 MED ORDER — HYDROCODONE-ACETAMINOPHEN 5-325 MG PO TABS: 1.0000 | ORAL_TABLET | Freq: Three times a day (TID) | ORAL | 0 refills | Status: DC | PRN

## 2019-10-14 NOTE — Telephone Encounter (Signed)
Pt calling again requesting refill hydrocodone apap to [facility]. I explained to pt that Dr Silvio Pate was not in the office this morning and is seeing pts now and pt request cb when hydrocodone sent to pharmacy/ pt thinks she has enough med to last until 10/15/19.

## 2019-10-14 NOTE — Telephone Encounter (Signed)
[place] Night - Client TELEPHONE ADVICE RECORD AccessNurse Patient Name: Lindsey Ward Gender: Female DOB: 1950-11-19 Age: 69 Y 45 M 17 D Return Phone Number: [phone] (Primary) [place] [address] Client [facility] Night - Client Client Site [facility] Physician Viviana Simpler- MD Contact Type Call Who Is Calling Patient / Member / Family / Caregiver Call Type Triage / Clinical Relationship To Patient Self Return Phone Number [phone] (Primary) Chief Complaint Prescription Refill or Medication Request (non symptomatic) Reason for Call Medication Question / Request Initial Comment She is needing her Hydrocodone called in. Translation No Nurse Assessment Nurse: Zenia Resides, RN, Diane Date/Time [name] Time): 10/13/2019 3:02:41 PM Confirm and document reason for call. If symptomatic, describe symptoms. ---Caller states she needs her Hydrocodone called in. Has enough for tomorrow, but needs her refill called in. Medicine is for fibromyalgia pain. She will call the office in the am for her refill. Has the patient had close contact with a person known or suspected to have the novel coronavirus illness OR traveled / lives in area with major community spread (including international travel) in the last 14 days from the onset of symptoms? * If Asymptomatic, screen for exposure and travel within the last 14 days. ---No Does the patient have any new or worsening symptoms? ---No Disp. Time [name] Time) Disposition Final User 10/13/2019 3:05:20 PM Clinical Call Yes Zenia Resides, RN, Diane

## 2019-10-14 NOTE — Telephone Encounter (Signed)
Pt left v/m requesting refill of hydrocodone apap 5-325 mg to [facility]. Request already sent to Dr Silvio Pate who will be in office this afternoon.

## 2019-10-14 NOTE — Telephone Encounter (Signed)
Name of Medication: Hydrocodone apap 5-325 mg Name of Pharmacy: [facility] or Written Date and Quantity: # 44 on 08/05/19 Last Office Visit and Type:09/23/19 6 mth narc FU  Next Office Visit and Type:01/02/20 for 3 mth FU  Last Controlled Substance Agreement Date: 06/12/19 Last UDS:06/04/19

## 2019-10-23 DIAGNOSIS — H16223 Keratoconjunctivitis sicca, not specified as Sjogren's, bilateral: Secondary | ICD-10-CM | POA: Diagnosis not present

## 2019-10-23 DIAGNOSIS — H01009 Unspecified blepharitis unspecified eye, unspecified eyelid: Secondary | ICD-10-CM | POA: Diagnosis not present

## 2019-10-30 ENCOUNTER — Other Ambulatory Visit: Payer: Self-pay | Admitting: Internal Medicine

## 2019-10-30 NOTE — Telephone Encounter (Signed)
Last filled 08-21-19 #90 Last OV 09-23-19 Next OV 01-02-20 [facility]

## 2019-12-26 ENCOUNTER — Other Ambulatory Visit: Payer: Self-pay | Admitting: Internal Medicine

## 2019-12-26 NOTE — Telephone Encounter (Signed)
Last filled 10-31-19 #90 Last OV 09-23-19 Next OV 01-02-20 [facility]

## 2020-01-02 ENCOUNTER — Other Ambulatory Visit: Payer: Self-pay

## 2020-01-02 ENCOUNTER — Ambulatory Visit (INDEPENDENT_AMBULATORY_CARE_PROVIDER_SITE_OTHER): Payer: Medicare Other | Admitting: Internal Medicine

## 2020-01-02 ENCOUNTER — Encounter: Payer: Self-pay | Admitting: Internal Medicine

## 2020-01-02 VITALS — BP 124/68 | HR 63 | Temp 97.9°F | Wt 122.5 lb

## 2020-01-02 DIAGNOSIS — G8929 Other chronic pain: Secondary | ICD-10-CM | POA: Insufficient documentation

## 2020-01-02 DIAGNOSIS — F1120 Opioid dependence, uncomplicated: Secondary | ICD-10-CM

## 2020-01-02 DIAGNOSIS — M2559 Pain in other specified joint: Secondary | ICD-10-CM

## 2020-01-02 DIAGNOSIS — Z23 Encounter for immunization: Secondary | ICD-10-CM

## 2020-01-02 DIAGNOSIS — M797 Fibromyalgia: Secondary | ICD-10-CM | POA: Diagnosis not present

## 2020-01-02 DIAGNOSIS — M2550 Pain in unspecified joint: Secondary | ICD-10-CM | POA: Insufficient documentation

## 2020-01-02 NOTE — Progress Notes (Signed)
Subjective:    Patient ID: Lindsey Ward, female    DOB: May 17, 1950, 69 y.o.   MRN: [id number]  HPI Here for follow up of chronic pain and narcotic dependence This visit occurred during the SARS-CoV-2 public health emergency.  Safety protocols were in place, including screening questions prior to the visit, additional usage of staff PPE, and extensive cleaning of exam room while observing appropriate contact time as indicated for disinfecting solutions.   She would like to see rheumatologist Bilateral CMC, left knee and elbows Some swelling  Continues on hydrocodone--uses intermittent Does try to skip days No significant back pain  Anxiety persists Gets panic and nerves "shot up" Does take the xanax regularly--at least daily  Current Outpatient Medications on File Prior to Visit  Medication Sig Dispense Refill  . ALPRAZolam (XANAX) 1 MG tablet TAKE 1 TABLET (1 MG TOTAL) BY MOUTH 3 (THREE) TIMES DAILY AS NEEDED. 90 tablet 0  . Carboxymethylcellul-Glycerin (REFRESH OPTIVE OP) Apply to eye. Gel drops lubricant eye gel    . cetirizine (ZYRTEC) 10 MG tablet TAKE 1 TABLET BY MOUTH EVERY DAY 30 tablet 11  . cyclobenzaprine (FLEXERIL) 5 MG tablet TAKE 1 TABLET (5 MG TOTAL) BY MOUTH AT BEDTIME AS NEEDED FOR MUSCLE SPASMS. 30 tablet 2  . HYDROcodone-acetaminophen (NORCO/VICODIN) 5-325 MG tablet Take 1 tablet by mouth 3 (three) times daily as needed. Dx Code M79.7 90 tablet 0  . ketoconazole (NIZORAL) 2 % cream Apply 1 application topically 2 (two) times daily as needed for irritation. 60 g 1  . omeprazole (PRILOSEC) 20 MG capsule Take 1 capsule (20 mg total) by mouth 2 (two) times daily. 180 capsule 3  . Polyethyl Glycol-Propyl Glycol (SYSTANE) 0.4-0.3 % SOLN Apply to eye.    . SUMAtriptan (IMITREX) 50 MG tablet TAKE 0.5-1 TABLET BY MOUTH DAILY AS NEEDED FOR MIGRAINE. MAX PER INS. 9 tablet 13  . triamcinolone cream (KENALOG) 0.1 % APPLY 1 APPLICATION TOPICALLY 2 (TWO) TIMES DAILY AS NEEDED.  45 g 1  . promethazine (PHENERGAN) 12.5 MG tablet TAKE 1-2 TABLETS (12.5-25 MG TOTAL) BY MOUTH 3 (THREE) TIMES DAILY AS NEEDED FOR NAUSEA OR VOMITING. (Patient not taking: Reported on 01/02/2020) 60 tablet 0   No current facility-administered medications on file prior to visit.    Allergies  Allergen Reactions  . Tizanidine Other (See Comments)    "throws me in a different world"  . Aspirin     REACTION: GI bleed  . Buspirone Hcl     REACTION: unspecified  . Ciprofloxacin     REACTION: unspecified  . Citalopram Other (See Comments)    Ear ringing.   . Codeine   . Diazepam   . Duloxetine     REACTION: sz like activity  . Ibuprofen     REACTION: GI bleed  . Imipramine Hcl Other (See Comments)    Ear ringing.   . Oxycodone-Acetaminophen     REACTION: unspecified  . Pregabalin     REACTION: kept her up and confusion  . Propoxyphene Hcl   . Risperidone     REACTION: confusion  . Sulfonamide Derivatives     REACTION: unspecified  . Tramadol Hcl     REACTION: doesn't remember what happened but couldn't tolerate  . Tramadol Hcl Other (See Comments)    REACTION: doesn't remember what happened but couldn't tolerate REACTION: doesn't remember what happened but couldn't tolerate REACTION: doesn't remember what happened but couldn't tolerate   . Venlafaxine     REACTION:  unspecified  . Methocarbamol Rash    Past Medical History:  Diagnosis Date  . Bowel obstruction ([place])   . Chronic fatigue   . Depression   . Fibromyalgia   . GERD (gastroesophageal reflux disease)   . Hyperlipemia   . Migraine   . Obstipation   . Osteopenia   . PUD (peptic ulcer disease)     Past Surgical History:  Procedure Laterality Date  . ABDOMINAL SURGERY    . APPENDECTOMY  2005  . BREAST BIOPSY Bilateral 1999   rt w/out clip - neg, lt w/clip - neg  . BREAST CYST ASPIRATION Right 2003   neg  . CHOLECYSTECTOMY  2004  . COLONOSCOPY WITH PROPOFOL N/A 06/09/2016   Procedure: COLONOSCOPY WITH  PROPOFOL;  Surgeon: Jonathon Bellows, MD;  Location: [facility] ENDOSCOPY;  Service: Endoscopy;  Laterality: N/A;  . ESOPHAGOGASTRODUODENOSCOPY (EGD) WITH PROPOFOL N/A 06/09/2016   Procedure: ESOPHAGOGASTRODUODENOSCOPY (EGD) WITH PROPOFOL;  Surgeon: Jonathon Bellows, MD;  Location: [facility] ENDOSCOPY;  Service: Endoscopy;  Laterality: N/A;  . EYE SURGERY    . GIVENS CAPSULE STUDY N/A 06/23/2016   Procedure: GIVENS CAPSULE STUDY;  Surgeon: Jonathon Bellows, MD;  Location: [facility] ENDOSCOPY;  Service: Endoscopy;  Laterality: N/A;  . PARS PLANA VITRECTOMY W/ REPAIR OF MACULAR HOLE Left 01/2017   [facility]  . TOTAL ABDOMINAL HYSTERECTOMY W/ BILATERAL SALPINGOOPHORECTOMY  1986    Family History  Problem Relation Age of Onset  . Heart disease Mother        AMI  . Stroke Mother   . Heart disease Father   . Hypertension Sister   . Breast cancer Sister   . Hypertension Sister   . Heart disease Sister        MI  . Diabetes Sister   . Kidney cancer Neg Hx   . Kidney disease Neg Hx   . Prostate cancer Neg Hx     Social History   Socioeconomic History  . Marital status: Single    Spouse name: Not on file  . Number of children: 1  . Years of education: Not on file  . Highest education level: Not on file  Occupational History  . Occupation: DISABLED  Tobacco Use  . Smoking status: Former Smoker    Packs/day: 1.00    Years: 40.00    Pack years: 40.00    Types: Cigarettes    Quit date: 09/25/2013    Years since quitting: 6.2  . Smokeless tobacco: Never Used  . Tobacco comment:    Vaping Use  . Vaping Use: Never used  Substance and Sexual Activity  . Alcohol use: Not Currently    Alcohol/week: 0.0 standard drinks    Comment: rarely  . Drug use: No  . Sexual activity: Not on file  Other Topics Concern  . Not on file  Social History Narrative   Living alone again      No living will   Daughter should make health care decisions   Requests DNR---done 10/24/14   No tube feeds if cognitively unaware   Highest level  of education: 9th   Social Determinants of Health   Financial Resource Strain:   . Difficulty of Paying Living Expenses: Not on file  Food Insecurity:   . Worried About Charity fundraiser in the Last Year: Not on file  . Ran Out of Food in the Last Year: Not on file  Transportation Needs:   . Lack of Transportation (Medical): Not on file  . Lack of  Transportation (Non-Medical): Not on file  Physical Activity:   . Days of Exercise per Week: Not on file  . Minutes of Exercise per Session: Not on file  Stress:   . Feeling of Stress : Not on file  Social Connections:   . Frequency of Communication with Friends and Family: Not on file  . Frequency of Social Gatherings with Friends and Family: Not on file  . Attends Religious Services: Not on file  . Active Member of Clubs or Organizations: Not on file  . Attends Archivist Meetings: Not on file  . Marital Status: Not on file  Intimate Partner Violence:   . Fear of Current or Ex-Partner: Not on file  . Emotionally Abused: Not on file  . Physically Abused: Not on file  . Sexually Abused: Not on file   Review of Systems  Sleeps okay---usually at least 4 hours. Gets up 8AM regardless Appetite is not big Has gained a few pounds though (COVID)     Objective:   Physical Exam Constitutional:      Appearance: Normal appearance.  Musculoskeletal:     Comments: Tenderness both CMC No obvious synovitis in elbows or knees  Neurological:     Mental Status: She is alert.            Assessment & Plan:

## 2020-01-02 NOTE — Assessment & Plan Note (Signed)
PDMP reviewed No concerns 

## 2020-01-02 NOTE — Assessment & Plan Note (Signed)
Chronic pain Uses the hydrocodone when worse Still some sleep problems Hard to walk due to leg/knee pain

## 2020-01-02 NOTE — Assessment & Plan Note (Signed)
No clear cut synovitis I am okay with rheumatology consultation if they will take her

## 2020-01-17 ENCOUNTER — Other Ambulatory Visit: Payer: Self-pay

## 2020-01-17 ENCOUNTER — Ambulatory Visit (INDEPENDENT_AMBULATORY_CARE_PROVIDER_SITE_OTHER): Payer: Medicare Other | Admitting: Internal Medicine

## 2020-01-17 ENCOUNTER — Encounter: Payer: Self-pay | Admitting: Internal Medicine

## 2020-01-17 ENCOUNTER — Telehealth: Payer: Self-pay

## 2020-01-17 VITALS — BP 122/70 | HR 65 | Temp 97.5°F | Ht 63.0 in | Wt 121.0 lb

## 2020-01-17 DIAGNOSIS — M6281 Muscle weakness (generalized): Secondary | ICD-10-CM | POA: Diagnosis not present

## 2020-01-17 DIAGNOSIS — H02834 Dermatochalasis of left upper eyelid: Secondary | ICD-10-CM | POA: Diagnosis not present

## 2020-01-17 DIAGNOSIS — H02831 Dermatochalasis of right upper eyelid: Secondary | ICD-10-CM | POA: Diagnosis not present

## 2020-01-17 DIAGNOSIS — H0102A Squamous blepharitis right eye, upper and lower eyelids: Secondary | ICD-10-CM | POA: Diagnosis not present

## 2020-01-17 DIAGNOSIS — H02413 Mechanical ptosis of bilateral eyelids: Secondary | ICD-10-CM | POA: Diagnosis not present

## 2020-01-17 DIAGNOSIS — H0102B Squamous blepharitis left eye, upper and lower eyelids: Secondary | ICD-10-CM | POA: Diagnosis not present

## 2020-01-17 DIAGNOSIS — M25552 Pain in left hip: Secondary | ICD-10-CM | POA: Diagnosis not present

## 2020-01-17 NOTE — Telephone Encounter (Signed)
[facility] Day - Client TELEPHONE ADVICE RECORD [facility]urse Patient Name: Lindsey Ward Gender: Female DOB: 12/07/1950 Age: 69 Y 78 M 21 D Return Phone Number: [phone] (Primary) [place] [address] Client [facility] Day - Client Client Site [facility] - Day Physician Viviana Simpler- MD Contact Type Call Who Is Calling Patient / Member / Family / Caregiver Call Type Triage / Clinical Relationship To Patient Self Return Phone Number [phone] (Primary) Chief Complaint Leg Pain Reason for Call Symptomatic / Request for [name] states left leg won't move on its own, they have to use their hands. She has bad pain when she tires to move it. Leg pain per charge. She should be able to answer phone in 20 minutes. Translation No Nurse Assessment Nurse: Mancel Bale, RN, Butch Penny Date/Time [name] Time): 01/17/2020 10:07:58 AM Confirm and document reason for call. If symptomatic, describe symptoms. ---Caller states she is having symptoms of pain in her leg. She states the pain prevents her from being able to bend her leg. The symptoms started on Wednesday. Does the patient have any new or worsening symptoms? ---Yes Will a triage be completed? ---Yes Related visit to physician within the last 2 weeks? ---Yes Does the PT have any chronic conditions? (i.e. diabetes, asthma, this includes High risk factors for pregnancy, etc.) ---No Is this a behavioral health or substance abuse call? ---No Guidelines Guideline Title Affirmed Question Affirmed Notes Nurse Date/Time [name] Time) Leg Pain Patient sounds very sick or weak to the triager Mancel Bale, RN, Butch Penny 01/17/2020 10:10:34 AM Disp. Time [name] Time) Disposition Final User 01/17/2020 10:21:32 AM Go to ED Now Yes Mancel Bale, RN, Butch Penny Disposition Overriden: Go to ED Now (or PCP triage) Override Reason: Patient's  symptoms need a higher level of care Caller Disagree/Comply Disagree Caller Understands Yes PreDisposition Call Doctor PLEASE NOTE: All timestamps contained within this report are represented as [place] Standard Time. CONFIDENTIALTY NOTICE: This fax transmission is intended only for the addressee. It contains information that is legally privileged, confidential or otherwise protected from use or disclosure. If you are not the intended recipient, you are strictly prohibited from reviewing, disclosing, copying using or disseminating any of this information or taking any action in reliance on or regarding this information. If you have received this fax in error, please notify us immediately by telephone so that we can arrange for its return to [place]. Phone: [phone], Toll-Free: [phone], Fax: [phone] Page: 2 of 2 Call Id: [id number] Care Advice Given Per Guideline GO TO ED NOW: * You need to be seen in the Emergency Department. * Go to the ED at ___________ Hospital. Comments User: Marijo Conception, RN Date/Time [name] Time): 01/17/2020 10:15:16 AM Caller insists on seeing an MD in the office. Refused ED. User: Marijo Conception, RN Date/Time [name] Time): 01/17/2020 10:22:37 AM Spoke with Mearl Latin, RN on the back line and report was given. Stated she will make a callback to the patient. Referrals Warm transfer to backline

## 2020-01-17 NOTE — Progress Notes (Signed)
Subjective:    Patient ID: Lindsey Ward, female    DOB: 1950-10-02, 69 y.o.   MRN: [id number]  HPI Here due to left leg pain This visit occurred during the SARS-CoV-2 public health emergency.  Safety protocols were in place, including screening questions prior to the visit, additional usage of staff PPE, and extensive cleaning of exam room while observing appropriate contact time as indicated for disinfecting solutions.   Has weakness--can't lift it up Can walk on it---some pain then Started 2 days ago Pain along medial thigh when trying to lift it  No known injury Same back pain--no change No sensory changes in groin  Current Outpatient Medications on File Prior to Visit  Medication Sig Dispense Refill  . ALPRAZolam (XANAX) 1 MG tablet TAKE 1 TABLET (1 MG TOTAL) BY MOUTH 3 (THREE) TIMES DAILY AS NEEDED. 90 tablet 0  . Carboxymethylcellul-Glycerin (REFRESH OPTIVE OP) Apply to eye. Gel drops lubricant eye gel    . cetirizine (ZYRTEC) 10 MG tablet TAKE 1 TABLET BY MOUTH EVERY DAY 30 tablet 11  . cyclobenzaprine (FLEXERIL) 5 MG tablet TAKE 1 TABLET (5 MG TOTAL) BY MOUTH AT BEDTIME AS NEEDED FOR MUSCLE SPASMS. 30 tablet 2  . HYDROcodone-acetaminophen (NORCO/VICODIN) 5-325 MG tablet Take 1 tablet by mouth 3 (three) times daily as needed. Dx Code M79.7 90 tablet 0  . ketoconazole (NIZORAL) 2 % cream Apply 1 application topically 2 (two) times daily as needed for irritation. 60 g 1  . omeprazole (PRILOSEC) 20 MG capsule Take 1 capsule (20 mg total) by mouth 2 (two) times daily. 180 capsule 3  . Polyethyl Glycol-Propyl Glycol (SYSTANE) 0.4-0.3 % SOLN Apply to eye.    . promethazine (PHENERGAN) 12.5 MG tablet TAKE 1-2 TABLETS (12.5-25 MG TOTAL) BY MOUTH 3 (THREE) TIMES DAILY AS NEEDED FOR NAUSEA OR VOMITING. 60 tablet 0  . SUMAtriptan (IMITREX) 50 MG tablet TAKE 0.5-1 TABLET BY MOUTH DAILY AS NEEDED FOR MIGRAINE. MAX PER INS. 9 tablet 13  . triamcinolone cream (KENALOG) 0.1 % APPLY 1  APPLICATION TOPICALLY 2 (TWO) TIMES DAILY AS NEEDED. 45 g 1   No current facility-administered medications on file prior to visit.    Allergies  Allergen Reactions  . Tizanidine Other (See Comments)    "throws me in a different world"  . Aspirin     REACTION: GI bleed  . Buspirone Hcl     REACTION: unspecified  . Ciprofloxacin     REACTION: unspecified  . Citalopram Other (See Comments)    Ear ringing.   . Codeine   . Diazepam   . Duloxetine     REACTION: sz like activity  . Ibuprofen     REACTION: GI bleed  . Imipramine Hcl Other (See Comments)    Ear ringing.   . Oxycodone-Acetaminophen     REACTION: unspecified  . Pregabalin     REACTION: kept her up and confusion  . Propoxyphene Hcl   . Risperidone     REACTION: confusion  . Sulfonamide Derivatives     REACTION: unspecified  . Tramadol Hcl     REACTION: doesn't remember what happened but couldn't tolerate  . Tramadol Hcl Other (See Comments)    REACTION: doesn't remember what happened but couldn't tolerate REACTION: doesn't remember what happened but couldn't tolerate REACTION: doesn't remember what happened but couldn't tolerate   . Venlafaxine     REACTION: unspecified  . Methocarbamol Rash    Past Medical History:  Diagnosis Date  . Bowel  obstruction ([place])   . Chronic fatigue   . Depression   . Fibromyalgia   . GERD (gastroesophageal reflux disease)   . Hyperlipemia   . Migraine   . Obstipation   . Osteopenia   . PUD (peptic ulcer disease)     Past Surgical History:  Procedure Laterality Date  . ABDOMINAL SURGERY    . APPENDECTOMY  2005  . BREAST BIOPSY Bilateral 1999   rt w/out clip - neg, lt w/clip - neg  . BREAST CYST ASPIRATION Right 2003   neg  . CHOLECYSTECTOMY  2004  . COLONOSCOPY WITH PROPOFOL N/A 06/09/2016   Procedure: COLONOSCOPY WITH PROPOFOL;  Surgeon: Jonathon Bellows, MD;  Location: [facility] ENDOSCOPY;  Service: Endoscopy;  Laterality: N/A;  . ESOPHAGOGASTRODUODENOSCOPY (EGD) WITH  PROPOFOL N/A 06/09/2016   Procedure: ESOPHAGOGASTRODUODENOSCOPY (EGD) WITH PROPOFOL;  Surgeon: Jonathon Bellows, MD;  Location: [facility] ENDOSCOPY;  Service: Endoscopy;  Laterality: N/A;  . EYE SURGERY    . GIVENS CAPSULE STUDY N/A 06/23/2016   Procedure: GIVENS CAPSULE STUDY;  Surgeon: Jonathon Bellows, MD;  Location: [facility] ENDOSCOPY;  Service: Endoscopy;  Laterality: N/A;  . PARS PLANA VITRECTOMY W/ REPAIR OF MACULAR HOLE Left 01/2017   [facility]  . TOTAL ABDOMINAL HYSTERECTOMY W/ BILATERAL SALPINGOOPHORECTOMY  1986    Family History  Problem Relation Age of Onset  . Heart disease Mother        AMI  . Stroke Mother   . Heart disease Father   . Hypertension Sister   . Breast cancer Sister   . Hypertension Sister   . Heart disease Sister        MI  . Diabetes Sister   . Kidney cancer Neg Hx   . Kidney disease Neg Hx   . Prostate cancer Neg Hx     Social History   Socioeconomic History  . Marital status: Single    Spouse name: Not on file  . Number of children: 1  . Years of education: Not on file  . Highest education level: Not on file  Occupational History  . Occupation: DISABLED  Tobacco Use  . Smoking status: Former Smoker    Packs/day: 1.00    Years: 40.00    Pack years: 40.00    Types: Cigarettes    Quit date: 09/25/2013    Years since quitting: 6.3  . Smokeless tobacco: Never Used  . Tobacco comment:    Vaping Use  . Vaping Use: Never used  Substance and Sexual Activity  . Alcohol use: Not Currently    Alcohol/week: 0.0 standard drinks    Comment: rarely  . Drug use: No  . Sexual activity: Not on file  Other Topics Concern  . Not on file  Social History Narrative   Living alone again      No living will   Daughter should make health care decisions   Requests DNR---done 10/24/14   No tube feeds if cognitively unaware   Highest level of education: 9th   Social Determinants of Health   Financial Resource Strain:   . Difficulty of Paying Living Expenses: Not on file    Food Insecurity:   . Worried About Charity fundraiser in the Last Year: Not on file  . Ran Out of Food in the Last Year: Not on file  Transportation Needs:   . Lack of Transportation (Medical): Not on file  . Lack of Transportation (Non-Medical): Not on file  Physical Activity:   . Days of Exercise per Week:  Not on file  . Minutes of Exercise per Session: Not on file  Stress:   . Feeling of Stress : Not on file  Social Connections:   . Frequency of Communication with Friends and Family: Not on file  . Frequency of Social Gatherings with Friends and Family: Not on file  . Attends Religious Services: Not on file  . Active Member of Clubs or Organizations: Not on file  . Attends Archivist Meetings: Not on file  . Marital Status: Not on file  Intimate Partner Violence:   . Fear of Current or Ex-Partner: Not on file  . Emotionally Abused: Not on file  . Physically Abused: Not on file  . Sexually Abused: Not on file   Review of Systems  No swelling in leg No fevers or illness     Objective:   Physical Exam Constitutional:      Appearance: Normal appearance.  Musculoskeletal:     Comments: Unable to flex left thigh Some pain with left hip rotation No obvious muscle defect No spine tenderness  Neurological:     Mental Status: She is alert.     Comments: Not really leg weakness--can walk fine once she gets going----just can't flex at left hip            Assessment & Plan:

## 2020-01-17 NOTE — Telephone Encounter (Signed)
Pt has already been seen by Dr Silvio Pate 01/17/20.

## 2020-01-17 NOTE — Telephone Encounter (Signed)
See OV note.  

## 2020-01-17 NOTE — Telephone Encounter (Signed)
Butch Penny RN with access nurse said for 2 days pt has had lt leg pain;pt can stand and walk but difficult to bend knee.pt had ED disposition but pt denied going to ED. I spoke with pt and pt  Said she does not have redness or swelling in lt leg. The upper lt leg is at a pain level of 10 when pt tries to manipulate leg to sit or lay due to lt knee not wanting to bend. Pt can walk short distance without a lot of pain.No CP or SOB. Pt took appt with Dr Silvio Pate 01/17/20 at 11 AM.

## 2020-01-17 NOTE — Assessment & Plan Note (Signed)
Sudden and fairly striking on left I am concerned about possible proximal quad tear (or tendon) Will set up ASAP with ortho

## 2020-01-22 ENCOUNTER — Ambulatory Visit: Payer: Medicare Other | Admitting: Internal Medicine

## 2020-01-27 ENCOUNTER — Telehealth: Payer: Self-pay | Admitting: *Deleted

## 2020-01-27 NOTE — Telephone Encounter (Signed)
Spoke to pt. Made appt with Dr Lorelei Pont Wednesday.

## 2020-01-27 NOTE — Telephone Encounter (Signed)
Patient called stating that she saw Dr. Silvio Pate on 01/17/20 and he had her go over to an Urgent Care on [facility] for left leg weakness. Patient stated that she does not remember the name of the UC. Patient stated that she was given a Prednisone dose pack and flexeril which has not helped much. Patient stated that she was wondering if Dr. Silvio Pate may want to given her a stronger dose of Prednisone to see if that would help?  Patient stated that she is still having the weakness and not sure what the next step would be. Patient requested a call back with suggestions. Pharmacy [facility]

## 2020-01-27 NOTE — Telephone Encounter (Signed)
They diagnosed bursitis I think she may need to come back here for me or Dr Lorelei Pont to recheck (probably best to set her up with Dr C)

## 2020-01-27 NOTE — Telephone Encounter (Signed)
Notes received and given to Dr Silvio Pate

## 2020-01-27 NOTE — Telephone Encounter (Signed)
Called ans spoke to April at [facility]. She is faxing the notes.

## 2020-01-27 NOTE — Telephone Encounter (Signed)
I had referred her urgently to ortho--so I assume she was seen at the ortho urgent care ([facility]?) I have not gotten a note---we should try to get that so I know what they diagnosed. If she has a partial muscle tear, it could take quite a while till it improves.

## 2020-01-29 ENCOUNTER — Encounter: Payer: Self-pay | Admitting: Family Medicine

## 2020-01-29 ENCOUNTER — Other Ambulatory Visit: Payer: Self-pay

## 2020-01-29 ENCOUNTER — Ambulatory Visit (INDEPENDENT_AMBULATORY_CARE_PROVIDER_SITE_OTHER): Payer: Medicare Other | Admitting: Family Medicine

## 2020-01-29 VITALS — BP 120/60 | HR 65 | Temp 97.7°F | Ht 63.0 in | Wt 118.2 lb

## 2020-01-29 DIAGNOSIS — M5137 Other intervertebral disc degeneration, lumbosacral region: Secondary | ICD-10-CM | POA: Diagnosis not present

## 2020-01-29 DIAGNOSIS — M5416 Radiculopathy, lumbar region: Secondary | ICD-10-CM | POA: Diagnosis not present

## 2020-01-29 MED ORDER — PREDNISONE 20 MG PO TABS
ORAL_TABLET | ORAL | 0 refills | Status: DC
Start: 2020-01-29 — End: 2020-04-16

## 2020-01-29 NOTE — Progress Notes (Signed)
Lindsey Cranshaw T. Lindsey Leitzke, MD, [place] at [facility] [place], [address]  Phone: [phone]  FAX: [place] - 69 y.o. female  MRN [id number]  Date of Birth: 12/27/1950  Date: 01/29/2020  PCP: Venia Carbon, MD  Referral: Venia Carbon, MD  Chief Complaint  Patient presents with  . Leg Pain    Left    This visit occurred during the SARS-CoV-2 public health emergency.  Safety protocols were in place, including screening questions prior to the visit, additional usage of staff PPE, and extensive cleaning of exam room while observing appropriate contact time as indicated for disinfecting solutions.   Subjective:   Lindsey Ward is a 69 y.o. very pleasant female patient with Body mass index is 20.95 kg/m. who presents with the following:  F/u 01/17/2020 OV with Dr. Silvio Pate, urgent referral to [facility], f/u here to be evaluated by me.  S/p medrol dosepak  Initially she was noticed to have some weakness with extension at the left lower extremity.  She is having difficulty walking she is also having some pain in her back.  When she sits for the better part of the day it will flareup sometimes.  Today it is better than it has been.  She does have some pain in the posterior pelvis and buttocks on the left.  She does think that she has some decreased sensation on the left leg as well.  She does chronically takes Vicodin as well.   On 10/2014 lumbar spine CT, the patient did have some degenerative disc disease from L3-S1.  Lateral thigh and lateral foot on the L Weak L hip flexor  L4-5 l sided radiculopathy  Review of Systems is noted in the HPI, as appropriate   Objective:   BP 120/60   Pulse 65   Temp 97.7 F (36.5 C) (Temporal)   Ht 5\' 3"  (1.6 m)   Wt 118 lb 4 oz (53.6 kg)   SpO2 98%   BMI 20.95 kg/m    Range of motion at  the waist:  Flexion, extension, lateral bending and rotation: She is able to forward flex to approximately 80 degrees.  Extension does cause some pain, and lateral bending and rotation are essentially preserved.  No echymosis or edema Rises to examination table with mild difficulty Gait: minimally antalgic  Inspection/Deformity: N Paraspinus Tenderness: L3-S1, left greater than right  B Ankle Dorsiflexion (L5,4): 5/5 B Great Toe Dorsiflexion (L5,4): 5/5 Heel Walk (L5): WNL Toe Walk (S1): WNL Rise/Squat (L4): WNL, mild pain With hip flexion, the patient had does have some decreased strength on the left compared to the contralateral side  SENSORY B Medial Foot (L4): WNL B Dorsum (L5): WNL B Lateral on the left, patient does have decreased sensation at the thigh as well as on the left lower extremity  REFLEXES Knee (L4): 2+ Ankle (S1): 2+  B SLR, seated: neg B SLR, supine: neg B FABER: neg B Reverse FABER: neg B Greater Troch: NT B Log Roll: neg B Stork: NT B Sciatic Notch: NT   Radiology: No results found.  Assessment and Plan:     ICD-10-CM   1. Lumbar radiculopathy, acute  M54.16   2. DDD (degenerative disc disease), lumbosacral  M51.37    Acute lumbar radiculopathy, probable L4-5 versus L5-S1 with some small amount of weakness and decreased sensation on the lateral leg as well  as to a lesser extent the lateral thigh.  Start with some prednisone, progress as tolerated.  Certainly physical therapy is appropriate if she has some ongoing continued issues.  She also does have prior continuity of care patient with Dr. Sharlet Salina, she is also been seen by the rheumatology as well.  Meds ordered this encounter  Medications  . predniSONE (DELTASONE) 20 MG tablet    Sig: 2 tabs po for 7 days, then 1 tab po for 7 days    Dispense:  21 tablet    Refill:  0   There are no discontinued medications. No orders of the defined types were placed in this encounter.   Follow-up: No  follow-ups on file.  Signed,  Maud Deed. Moksha Dorgan, MD   Outpatient Encounter Medications as of 01/29/2020  Medication Sig  . ALPRAZolam (XANAX) 1 MG tablet TAKE 1 TABLET (1 MG TOTAL) BY MOUTH 3 (THREE) TIMES DAILY AS NEEDED.  . Carboxymethylcellul-Glycerin (REFRESH OPTIVE OP) Apply to eye. Gel drops lubricant eye gel  . cetirizine (ZYRTEC) 10 MG tablet TAKE 1 TABLET BY MOUTH EVERY DAY  . cyclobenzaprine (FLEXERIL) 5 MG tablet TAKE 1 TABLET (5 MG TOTAL) BY MOUTH AT BEDTIME AS NEEDED FOR MUSCLE SPASMS.  Marland Kitchen HYDROcodone-acetaminophen (NORCO/VICODIN) 5-325 MG tablet Take 1 tablet by mouth 3 (three) times daily as needed. Dx Code M79.7  . ketoconazole (NIZORAL) 2 % cream Apply 1 application topically 2 (two) times daily as needed for irritation.  Marland Kitchen omeprazole (PRILOSEC) 20 MG capsule Take 1 capsule (20 mg total) by mouth 2 (two) times daily.  Vladimir Faster Glycol-Propyl Glycol (SYSTANE) 0.4-0.3 % SOLN Apply to eye.  . promethazine (PHENERGAN) 12.5 MG tablet TAKE 1-2 TABLETS (12.5-25 MG TOTAL) BY MOUTH 3 (THREE) TIMES DAILY AS NEEDED FOR NAUSEA OR VOMITING.  . SUMAtriptan (IMITREX) 50 MG tablet TAKE 0.5-1 TABLET BY MOUTH DAILY AS NEEDED FOR MIGRAINE. MAX PER INS.  Marland Kitchen triamcinolone cream (KENALOG) 0.1 % APPLY 1 APPLICATION TOPICALLY 2 (TWO) TIMES DAILY AS NEEDED.  Marland Kitchen predniSONE (DELTASONE) 20 MG tablet 2 tabs po for 7 days, then 1 tab po for 7 days   No facility-administered encounter medications on file as of 01/29/2020.

## 2020-02-05 DIAGNOSIS — M797 Fibromyalgia: Secondary | ICD-10-CM | POA: Diagnosis not present

## 2020-02-05 DIAGNOSIS — M5030 Other cervical disc degeneration, unspecified cervical region: Secondary | ICD-10-CM | POA: Diagnosis not present

## 2020-02-05 DIAGNOSIS — M8949 Other hypertrophic osteoarthropathy, multiple sites: Secondary | ICD-10-CM | POA: Diagnosis not present

## 2020-02-05 DIAGNOSIS — M5136 Other intervertebral disc degeneration, lumbar region: Secondary | ICD-10-CM | POA: Diagnosis not present

## 2020-02-09 ENCOUNTER — Other Ambulatory Visit: Payer: Self-pay | Admitting: Internal Medicine

## 2020-02-09 NOTE — Telephone Encounter (Signed)
Last filled 12-29-19 #90 Last OV 01-29-20 Dr Lorelei Pont Next OV 04-16-20 [facility]

## 2020-02-10 ENCOUNTER — Other Ambulatory Visit: Payer: Self-pay | Admitting: *Deleted

## 2020-02-10 MED ORDER — HYDROCODONE-ACETAMINOPHEN 5-325 MG PO TABS: 1.0000 | ORAL_TABLET | Freq: Three times a day (TID) | ORAL | 0 refills | Status: DC | PRN

## 2020-02-10 NOTE — Telephone Encounter (Signed)
Patient left a voicemail requesting a refill and would like a call back when done Name of Medication: Hydrocodone [facility] Last Fill or Written Date and Quantity: 10/14/19 #90 Last Office Visit and Type: 01/29/20 acute Next Office Visit and Type: 04/16/20 Last Controlled Substance Agreement Date: 06/12/19 Last UDS:06/04/19

## 2020-03-19 DIAGNOSIS — M180 Bilateral primary osteoarthritis of first carpometacarpal joints: Secondary | ICD-10-CM | POA: Diagnosis not present

## 2020-03-19 DIAGNOSIS — M797 Fibromyalgia: Secondary | ICD-10-CM | POA: Diagnosis not present

## 2020-03-19 DIAGNOSIS — M5030 Other cervical disc degeneration, unspecified cervical region: Secondary | ICD-10-CM | POA: Diagnosis not present

## 2020-03-19 DIAGNOSIS — M8949 Other hypertrophic osteoarthropathy, multiple sites: Secondary | ICD-10-CM | POA: Diagnosis not present

## 2020-03-19 DIAGNOSIS — M5136 Other intervertebral disc degeneration, lumbar region: Secondary | ICD-10-CM | POA: Diagnosis not present

## 2020-04-13 ENCOUNTER — Telehealth: Payer: Self-pay | Admitting: Cardiovascular Disease

## 2020-04-13 ENCOUNTER — Telehealth: Payer: Self-pay | Admitting: Blood Banking & Transfusion Medicine

## 2020-04-13 NOTE — Telephone Encounter (Signed)
3 attempts to schedule fu appt from recall list.   Deleting recall.   

## 2020-04-13 NOTE — Telephone Encounter (Signed)
Encounter entered in error.

## 2020-04-15 ENCOUNTER — Other Ambulatory Visit: Payer: Self-pay

## 2020-04-16 ENCOUNTER — Encounter: Payer: Self-pay | Admitting: Internal Medicine

## 2020-04-16 ENCOUNTER — Ambulatory Visit (INDEPENDENT_AMBULATORY_CARE_PROVIDER_SITE_OTHER): Payer: Medicare Other | Admitting: Internal Medicine

## 2020-04-16 VITALS — BP 132/78 | HR 62 | Temp 97.5°F | Ht 62.0 in | Wt 119.0 lb

## 2020-04-16 DIAGNOSIS — F1320 Sedative, hypnotic or anxiolytic dependence, uncomplicated: Secondary | ICD-10-CM

## 2020-04-16 DIAGNOSIS — M797 Fibromyalgia: Secondary | ICD-10-CM | POA: Diagnosis not present

## 2020-04-16 DIAGNOSIS — M5441 Lumbago with sciatica, right side: Secondary | ICD-10-CM | POA: Diagnosis not present

## 2020-04-16 DIAGNOSIS — F1120 Opioid dependence, uncomplicated: Secondary | ICD-10-CM | POA: Diagnosis not present

## 2020-04-16 DIAGNOSIS — I700 Atherosclerosis of aorta: Secondary | ICD-10-CM

## 2020-04-16 DIAGNOSIS — G8929 Other chronic pain: Secondary | ICD-10-CM | POA: Diagnosis not present

## 2020-04-16 DIAGNOSIS — Z0000 Encounter for general adult medical examination without abnormal findings: Secondary | ICD-10-CM

## 2020-04-16 DIAGNOSIS — F39 Unspecified mood [affective] disorder: Secondary | ICD-10-CM | POA: Diagnosis not present

## 2020-04-16 NOTE — Progress Notes (Addendum)
Subjective:    Patient ID: [place], female    DOB: [birth year], 70 y.o.   MRN: [id number]  HPI Here for Medicare wellness visit and follow up of chronic health conditions This visit occurred during the SARS-CoV-2 public health emergency.  Safety protocols were in place, including screening questions prior to the visit, additional usage of staff PPE, and extensive cleaning of exam room while observing appropriate contact time as indicated for disinfecting solutions.   Reviewed advanced directives Reviewed other doctors--Dr Thurmond--opto, Dr Simona Huh ?---dentist, Dr Patel--rheum, Dr Gardiner Rhyme, Dr Rockey Situ --cardiology No hospitalizations or surgery in the past year Vision is poor in left eye--okay right Hearing is fine No tobacco Rare beer No falls Chronic mood issues Still drives and does all instrumental ADLs (but shopping will knock her out) No sig memory issues  Did see the rheumatologist--Dr Posey Pronto Diagnosed with osteoarthritis Chronic pain is stable Uses the hydrocodone twice a day usually---and occasionally the third  Ongoing anxiety Uses xanax regularly Some depression----most days but not severe. Not really anhedonic Intolerant of multiple medications  Still gets some migraines Uses imitrex up to 2-3 times a week Recently has awoken with some migraines----imitrex/xanax will abort (not the imitrex alone though)  Known aortic atherosclerosis Can't tolerate statin  Current Outpatient Medications on File Prior to Visit  Medication Sig Dispense Refill  . ALPRAZolam (XANAX) 1 MG tablet TAKE 1 TABLET BY MOUTH 3 TIMES DAILY AS NEEDED. 90 tablet 0  . Carboxymethylcellul-Glycerin (REFRESH OPTIVE OP) Apply to eye. Gel drops lubricant eye gel    . cetirizine (ZYRTEC) 10 MG tablet TAKE 1 TABLET BY MOUTH EVERY DAY 30 tablet 11  . cyclobenzaprine (FLEXERIL) 5 MG tablet TAKE 1 TABLET (5 MG TOTAL) BY MOUTH AT BEDTIME AS NEEDED FOR MUSCLE SPASMS. 30 tablet 2  .  HYDROcodone-acetaminophen (NORCO/VICODIN) 5-325 MG tablet Take 1 tablet by mouth 3 (three) times daily as needed. Dx Code M79.7 90 tablet 0  . ketoconazole (NIZORAL) 2 % cream Apply 1 application topically 2 (two) times daily as needed for irritation. 60 g 1  . omeprazole (PRILOSEC) 20 MG capsule Take 1 capsule (20 mg total) by mouth 2 (two) times daily. 180 capsule 3  . promethazine (PHENERGAN) 12.5 MG tablet TAKE 1-2 TABLETS (12.5-25 MG TOTAL) BY MOUTH 3 (THREE) TIMES DAILY AS NEEDED FOR NAUSEA OR VOMITING. 60 tablet 0  . SUMAtriptan (IMITREX) 50 MG tablet TAKE 0.5-1 TABLET BY MOUTH DAILY AS NEEDED FOR MIGRAINE. MAX PER INS. 9 tablet 13  . triamcinolone cream (KENALOG) 0.1 % APPLY 1 APPLICATION TOPICALLY 2 (TWO) TIMES DAILY AS NEEDED. 45 g 1   No current facility-administered medications on file prior to visit.    Allergies  Allergen Reactions  . Tizanidine Other (See Comments)    "throws me in a different world"  . Aspirin     REACTION: GI bleed  . Buspirone Hcl     REACTION: unspecified  . Ciprofloxacin     REACTION: unspecified  . Citalopram Other (See Comments)    Ear ringing.   . Codeine   . Diazepam   . Duloxetine     REACTION: sz like activity  . Ibuprofen     REACTION: GI bleed  . Imipramine Hcl Other (See Comments)    Ear ringing.   . Oxycodone-Acetaminophen     REACTION: unspecified  . Pregabalin     REACTION: kept her up and confusion  . Propoxyphene Hcl   . Risperidone     REACTION:  confusion  . Sulfonamide Derivatives     REACTION: unspecified  . Tramadol Hcl     REACTION: doesn't remember what happened but couldn't tolerate  . Tramadol Hcl Other (See Comments)    REACTION: doesn't remember what happened but couldn't tolerate REACTION: doesn't remember what happened but couldn't tolerate REACTION: doesn't remember what happened but couldn't tolerate   . Venlafaxine     REACTION: unspecified  . Methocarbamol Rash    Past Medical History:  Diagnosis  Date  . Bowel obstruction ([place])   . Chronic fatigue   . Depression   . Fibromyalgia   . GERD (gastroesophageal reflux disease)   . Hyperlipemia   . Migraine   . Obstipation   . Osteopenia   . PUD (peptic ulcer disease)     Past Surgical History:  Procedure Laterality Date  . ABDOMINAL SURGERY    . APPENDECTOMY  2005  . BREAST BIOPSY Bilateral 1999   rt w/out clip - neg, lt w/clip - neg  . BREAST CYST ASPIRATION Right 2003   neg  . CHOLECYSTECTOMY  2004  . COLONOSCOPY WITH PROPOFOL N/A 06/09/2016   Procedure: COLONOSCOPY WITH PROPOFOL;  Surgeon: Jonathon Bellows, MD;  Location[facility] ENDOSCOPY;  Service: Endoscopy;  Laterality: N/A;  . ESOPHAGOGASTRODUODENOSCOPY (EGD) WITH PROPOFOL N/A 06/09/2016   Procedure: ESOPHAGOGASTRODUODENOSCOPY (EGD) WITH PROPOFOL;  Surgeon: Jonathon Bellows, MD;  Location: [facility] ENDOSCOPY;  Service: Endoscopy;  Laterality: N/A;  . EYE SURGERY    . GIVENS CAPSULE STUDY N/A 06/23/2016   Procedure: GIVENS CAPSULE STUDY;  Surgeon: Jonathon Bellows, MD;  Location: [facility] ENDOSCOPY;  Service: Endoscopy;  Laterality: N/A;  . PARS PLANA VITRECTOMY W/ REPAIR OF MACULAR HOLE Left 01/2017   [facility]  . TOTAL ABDOMINAL HYSTERECTOMY W/ BILATERAL SALPINGOOPHORECTOMY  1986    Family History  Problem Relation Age of Onset  . Heart disease Mother        AMI  . Stroke Mother   . Heart disease Father   . Hypertension Sister   . Breast cancer Sister   . Hypertension Sister   . Heart disease Sister        MI  . Diabetes Sister   . Kidney cancer Neg Hx   . Kidney disease Neg Hx   . Prostate cancer Neg Hx     Social History   Socioeconomic History  . Marital status: Single    Spouse name: Not on file  . Number of children: 1  . Years of education: Not on file  . Highest education level: Not on file  Occupational History  . Occupation: DISABLED  Tobacco Use  . Smoking status: Former Smoker    Packs/day: 1.00    Years: 40.00    Pack years: 40.00    Types: Cigarettes    Quit  date: 09/25/2013    Years since quitting: 6.5  . Smokeless tobacco: Never Used  . Tobacco comment:    Vaping Use  . Vaping Use: Never used  Substance and Sexual Activity  . Alcohol use: Not Currently    Alcohol/week: 0.0 standard drinks    Comment: rarely  . Drug use: No  . Sexual activity: Not on file  Other Topics Concern  . Not on file  Social History Narrative   Living alone again      No living will   Daughter should make health care decisions   Requests DNR---done 10/24/14   No tube feeds if cognitively unaware   Highest level of education: 9th  Social Determinants of Health   Financial Resource Strain: Not on file  Food Insecurity: Not on file  Transportation Needs: Not on file  Physical Activity: Not on file  Stress: Not on file  Social Connections: Not on file  Intimate Partner Violence: Not on file   Review of Systems Sleeps okay Not much appetite ---but weight stable Wears seat belt Teeth are fine---keeps up with dentist No chest pain Gets slight fluttering in chest at times---at night No dizziness or syncope No edema No heartburn with the daily omeprazole. No dysphagia Tends to constipation--no meds though No dysuria or incontinence No suspicious skin lesions--no dermatologist Uses cetirizine for allergy symptoms (eyes prominent)    Objective:   Physical Exam HENT:     Mouth/Throat:     Comments: No lesions Eyes:     Conjunctiva/sclera: Conjunctivae normal.     Pupils: Pupils are equal, round, and reactive to light.  Abdominal:     Palpations: Abdomen is soft.     Tenderness: There is no abdominal tenderness.  Neurological:     Mental Status: She is oriented to person, place, and time.     Comments: President---"Biden, Trump, Obama" [id number] Recall --2/3  Psychiatric:        Mood and Affect: Mood normal.        Behavior: Behavior normal.            Assessment & Plan:

## 2020-04-16 NOTE — Assessment & Plan Note (Signed)
PDMP reviewed No concerns 

## 2020-04-16 NOTE — Assessment & Plan Note (Signed)
On imaging With myalgias and reactions to meds---won't take statin

## 2020-04-16 NOTE — Assessment & Plan Note (Signed)
Chronic anxiety and dysthymia Takes the xanax regularly---intolerant of other meds

## 2020-04-16 NOTE — Assessment & Plan Note (Signed)
That and OA cause her pain

## 2020-04-16 NOTE — Assessment & Plan Note (Signed)
Uses the hydrocodone regularly 

## 2020-04-16 NOTE — Progress Notes (Signed)
Hearing Screening   Method: Audiometry   125Hz  250Hz  500Hz  1000Hz  2000Hz  3000Hz  4000Hz  6000Hz  8000Hz   Right ear:   20 20 20  20     Left ear:   25 25 25   0    Vision Screening Comments: Has an appointment March 2022

## 2020-04-16 NOTE — Assessment & Plan Note (Signed)
I have personally reviewed the Medicare Annual Wellness questionnaire and have noted 1. The patient's medical and social history 2. Their use of alcohol, tobacco or illicit drugs 3. Their current medications and supplements 4. The patient's functional ability including ADL's, fall risks, home safety risks and hearing or visual             impairment. 5. Diet and physical activities 6. Evidence for depression or mood disorders  The patients weight, height, BMI and visual acuity have been recorded in the chart I have made referrals, counseling and provided education to the patient based review of the above and I have provided the pt with a written personalized care plan for preventive services.  I have provided you with a copy of your personalized plan for preventive services. Please take the time to review along with your updated medication list.  Doesn't want COVID vaccine despite my urging shingrix probably too expensive Colon would be due 2028 Had flu vaccine Had mammogram in May--- due every 1-2 years till 59

## 2020-04-17 LAB — COMPREHENSIVE METABOLIC PANEL
ALT: 15 U/L (ref 0–35)
AST: 24 U/L (ref 0–37)
Albumin: 4.4 g/dL (ref 3.5–5.2)
Alkaline Phosphatase: 71 U/L (ref 39–117)
BUN: 9 mg/dL (ref 6–23)
CO2: 31 mEq/L (ref 19–32)
Calcium: 9.8 mg/dL (ref 8.4–10.5)
Chloride: 102 mEq/L (ref 96–112)
Creatinine, Ser: 0.7 mg/dL (ref 0.40–1.20)
GFR: 86.88 mL/min (ref >60.00)
Glucose, Bld: 95 mg/dL (ref 70–99)
Potassium: 3.9 mEq/L (ref 3.5–5.1)
Sodium: 139 mEq/L (ref 135–145)
Total Bilirubin: 0.3 mg/dL (ref 0.2–1.2)
Total Protein: 7 g/dL (ref 6.0–8.3)

## 2020-04-17 LAB — SEDIMENTATION RATE: Sed Rate: 8 mm/hr (ref 0–30)

## 2020-04-17 LAB — CBC
HCT: 36.9 % (ref 36.0–46.0)
Hemoglobin: 12.8 g/dL (ref 12.0–15.0)
MCHC: 34.7 g/dL (ref 30.0–36.0)
MCV: 92.8 fl (ref 78.0–100.0)
Platelets: 277 10*3/uL (ref 150.0–400.0)
RBC: 3.97 Mil/uL (ref 3.87–5.11)
RDW: 13.7 % (ref 11.5–15.5)
WBC: 6.3 10*3/uL (ref 4.0–10.5)

## 2020-04-18 LAB — DRUG MONITORING, PANEL 8 WITH CONFIRMATION, URINE
6 Acetylmorphine: NEGATIVE ng/mL (ref <10)
Alcohol Metabolites: NEGATIVE ng/mL
Alphahydroxyalprazolam: 336 ng/mL — ABNORMAL HIGH (ref <25)
Alphahydroxymidazolam: NEGATIVE ng/mL (ref <50)
Alphahydroxytriazolam: NEGATIVE ng/mL (ref <50)
Aminoclonazepam: NEGATIVE ng/mL (ref <25)
Amphetamines: NEGATIVE ng/mL (ref <500)
Benzodiazepines: POSITIVE ng/mL — AB (ref <100)
Buprenorphine, Urine: NEGATIVE ng/mL (ref <5)
Cocaine Metabolite: NEGATIVE ng/mL (ref <150)
Creatinine: 40.5 mg/dL
Hydroxyethylflurazepam: NEGATIVE ng/mL (ref <50)
Lorazepam: NEGATIVE ng/mL (ref <50)
MDMA: NEGATIVE ng/mL (ref <500)
Marijuana Metabolite: NEGATIVE ng/mL (ref <20)
Nordiazepam: NEGATIVE ng/mL (ref <50)
Opiates: NEGATIVE ng/mL (ref <100)
Oxazepam: NEGATIVE ng/mL (ref <50)
Oxidant: NEGATIVE ug/mL
Oxycodone: NEGATIVE ng/mL (ref <100)
Temazepam: NEGATIVE ng/mL (ref <50)
pH: 6.8 (ref 4.5–9.0)

## 2020-04-18 LAB — DM TEMPLATE

## 2020-04-23 DIAGNOSIS — G8929 Other chronic pain: Secondary | ICD-10-CM | POA: Diagnosis not present

## 2020-04-23 DIAGNOSIS — M545 Low back pain, unspecified: Secondary | ICD-10-CM | POA: Diagnosis not present

## 2020-04-28 ENCOUNTER — Other Ambulatory Visit: Payer: Self-pay | Admitting: Internal Medicine

## 2020-04-28 NOTE — Telephone Encounter (Signed)
Last filled 02-10-20 #90 Last OV 04-16-20 No Future OV [facility]

## 2020-05-05 DIAGNOSIS — H35371 Puckering of macula, right eye: Secondary | ICD-10-CM | POA: Diagnosis not present

## 2020-05-07 ENCOUNTER — Other Ambulatory Visit: Payer: Self-pay | Admitting: Internal Medicine

## 2020-05-07 NOTE — Telephone Encounter (Signed)
Last filled 05/03/2019 #9 with 13 refills  Last OV 04-16-20 No Future OV [facility]

## 2020-05-15 DIAGNOSIS — M5416 Radiculopathy, lumbar region: Secondary | ICD-10-CM | POA: Diagnosis not present

## 2020-05-15 DIAGNOSIS — M4726 Other spondylosis with radiculopathy, lumbar region: Secondary | ICD-10-CM | POA: Diagnosis not present

## 2020-05-15 DIAGNOSIS — M5136 Other intervertebral disc degeneration, lumbar region: Secondary | ICD-10-CM | POA: Diagnosis not present

## 2020-05-18 ENCOUNTER — Other Ambulatory Visit: Payer: Self-pay

## 2020-05-18 ENCOUNTER — Ambulatory Visit (INDEPENDENT_AMBULATORY_CARE_PROVIDER_SITE_OTHER): Payer: Medicare Other | Admitting: Internal Medicine

## 2020-05-18 ENCOUNTER — Telehealth: Payer: Self-pay | Admitting: *Deleted

## 2020-05-18 ENCOUNTER — Other Ambulatory Visit: Payer: Self-pay | Admitting: *Deleted

## 2020-05-18 VITALS — BP 124/70 | HR 58 | Temp 97.5°F | Wt 120.0 lb

## 2020-05-18 DIAGNOSIS — K140 Glossitis: Secondary | ICD-10-CM | POA: Diagnosis not present

## 2020-05-18 MED ORDER — HYDROCODONE-ACETAMINOPHEN 5-325 MG PO TABS: 1.0000 | ORAL_TABLET | Freq: Three times a day (TID) | ORAL | 0 refills | Status: DC | PRN

## 2020-05-18 MED ORDER — MAGIC MOUTHWASH W/LIDOCAINE: 5.0000 mL | Freq: Four times a day (QID) | ORAL | 0 refills | Status: DC | PRN

## 2020-05-18 NOTE — Telephone Encounter (Signed)
Patient called stating that she has some ulcers in her mouth and her tongue has turned white. Patient is wondering if she may have thrush. Patient stated that she ate a can of honey roasted peanuts in two days and feels that she has bitten her tongue. Patient stated that she has some ulcers in her mouth. Patient stated that she has been using warm salt water which has not helped her symptoms. Patient wants to know if you can recommend any type of mouthwash for the ulcers in her mouth. No appointments available at the office today or tomorrow. [facility]

## 2020-05-18 NOTE — Telephone Encounter (Signed)
She needs to be seen. We cant give her treatment for ulcers or possible thrush without seeing her. All she can really do at this point is gargle with salt water.

## 2020-05-18 NOTE — Progress Notes (Signed)
Subjective:    Patient ID: Lindsey Ward, female    DOB: 12-02-1950, 70 y.o.   MRN: [id number]  HPI  Pt presents to the clinic today with c/o a white coating on her tongue and oral ulcers in her mouth. She noticed this 2 days ago.  She reports a burning sensation to her tongue.  She denies difficulty swallowing.  She denies headache, runny nose, nasal congestion, ear pain or cough.  She has tried warm salt water gargles with no relief of symptoms.  Review of Systems  Past Medical History:  Diagnosis Date  . Bowel obstruction ([place])   . Chronic fatigue   . Depression   . Fibromyalgia   . GERD (gastroesophageal reflux disease)   . Hyperlipemia   . Migraine   . Obstipation   . Osteopenia   . PUD (peptic ulcer disease)     Current Outpatient Medications  Medication Sig Dispense Refill  . ALPRAZolam (XANAX) 1 MG tablet TAKE 1 TABLET BY MOUTH THREE TIMES A DAY AS NEEDED 90 tablet 0  . Carboxymethylcellul-Glycerin (REFRESH OPTIVE OP) Apply to eye. Gel drops lubricant eye gel    . cetirizine (ZYRTEC) 10 MG tablet TAKE 1 TABLET BY MOUTH EVERY DAY 30 tablet 11  . cyclobenzaprine (FLEXERIL) 5 MG tablet TAKE 1 TABLET (5 MG TOTAL) BY MOUTH AT BEDTIME AS NEEDED FOR MUSCLE SPASMS. 30 tablet 2  . HYDROcodone-acetaminophen (NORCO/VICODIN) 5-325 MG tablet Take 1 tablet by mouth 3 (three) times daily as needed. Dx Code M79.7 90 tablet 0  . ketoconazole (NIZORAL) 2 % cream Apply 1 application topically 2 (two) times daily as needed for irritation. 60 g 1  . omeprazole (PRILOSEC) 20 MG capsule Take 1 capsule (20 mg total) by mouth 2 (two) times daily. 180 capsule 3  . promethazine (PHENERGAN) 12.5 MG tablet TAKE 1-2 TABLETS (12.5-25 MG TOTAL) BY MOUTH 3 (THREE) TIMES DAILY AS NEEDED FOR NAUSEA OR VOMITING. 60 tablet 0  . SUMAtriptan (IMITREX) 50 MG tablet TAKE 0.5-1 TABLET BY MOUTH DAILY AS NEEDED FOR MIGRAINE. MAX PER INS. 9 tablet 11  . triamcinolone cream (KENALOG) 0.1 % APPLY 1 APPLICATION  TOPICALLY 2 (TWO) TIMES DAILY AS NEEDED. 45 g 1   No current facility-administered medications for this visit.    Allergies  Allergen Reactions  . Tizanidine Other (See Comments)    "throws me in a different world"  . Aspirin     REACTION: GI bleed  . Buspirone Hcl     REACTION: unspecified  . Ciprofloxacin     REACTION: unspecified  . Citalopram Other (See Comments)    Ear ringing.   . Codeine   . Diazepam   . Duloxetine     REACTION: sz like activity  . Ibuprofen     REACTION: GI bleed  . Imipramine Hcl Other (See Comments)    Ear ringing.   . Oxycodone-Acetaminophen     REACTION: unspecified  . Pregabalin     REACTION: kept her up and confusion  . Propoxyphene Hcl   . Risperidone     REACTION: confusion  . Sulfonamide Derivatives     REACTION: unspecified  . Tramadol Hcl     REACTION: doesn't remember what happened but couldn't tolerate  . Tramadol Hcl Other (See Comments)    REACTION: doesn't remember what happened but couldn't tolerate REACTION: doesn't remember what happened but couldn't tolerate REACTION: doesn't remember what happened but couldn't tolerate   . Venlafaxine     REACTION: unspecified  .  Methocarbamol Rash    Family History  Problem Relation Age of Onset  . Heart disease Mother        AMI  . Stroke Mother   . Heart disease Father   . Hypertension Sister   . Breast cancer Sister   . Hypertension Sister   . Heart disease Sister        MI  . Diabetes Sister   . Kidney cancer Neg Hx   . Kidney disease Neg Hx   . Prostate cancer Neg Hx     Social History   Socioeconomic History  . Marital status: Single    Spouse name: Not on file  . Number of children: 1  . Years of education: Not on file  . Highest education level: Not on file  Occupational History  . Occupation: DISABLED  Tobacco Use  . Smoking status: Former Smoker    Packs/day: 1.00    Years: 40.00    Pack years: 40.00    Types: Cigarettes    Quit date: 09/25/2013     Years since quitting: 6.6  . Smokeless tobacco: Never Used  . Tobacco comment:    Vaping Use  . Vaping Use: Never used  Substance and Sexual Activity  . Alcohol use: Not Currently    Alcohol/week: 0.0 standard drinks    Comment: rarely  . Drug use: No  . Sexual activity: Not on file  Other Topics Concern  . Not on file  Social History Narrative   Living alone again      No living will   Daughter should make health care decisions   Requests DNR---done 10/24/14   No tube feeds if cognitively unaware   Highest level of education: 9th   Social Determinants of Health   Financial Resource Strain: Not on file  Food Insecurity: Not on file  Transportation Needs: Not on file  Physical Activity: Not on file  Stress: Not on file  Social Connections: Not on file  Intimate Partner Violence: Not on file     Constitutional: Denies fever, malaise, fatigue, headache or abrupt weight changes.  HEENT: Patient reports coating on tongue and ulcerations in mouth.  Denies eye pain, eye redness, ear pain, ringing in the ears, wax buildup, runny nose, nasal congestion, bloody nose, or sore throat. Respiratory: Denies difficulty breathing, shortness of breath, cough or sputum production.   Cardiovascular: Denies chest pain, chest tightness, palpitations or swelling in the hands or feet.   No other specific complaints in a complete review of systems (except as listed in HPI above).     Objective:   Physical Exam BP 124/70   Pulse (!) 58   Temp (!) 97.5 F (36.4 C) (Temporal)   Wt 120 lb (54.4 kg)   SpO2 96%   BMI 21.95 kg/m   Wt Readings from Last 3 Encounters:  04/16/20 119 lb (54 kg)  01/29/20 118 lb 4 oz (53.6 kg)  01/17/20 121 lb (54.9 kg)    General: Appears her stated age, well developed, well nourished in NAD. Skin: Warm, dry and intact. No rashes noted. HEENT: Head: normal shape and size; Throat/Mouth: Oral ulceration noted on the inferior tongue.  No coating noted. Neck: No  adenopathy noted. Cardiovascular: Bradycardic. Pulmonary/Chest: Normal effort. Neurological: Alert and oriented.   BMET    Component Value Date/Time   NA 139 04/16/2020 1509   NA 132 (L) 06/12/2014 1840   K 3.9 04/16/2020 1509   K 3.8 06/12/2014 1840   CL  102 04/16/2020 1509   CL 96 (L) 06/12/2014 1840   CO2 31 04/16/2020 1509   CO2 28 06/12/2014 1840   GLUCOSE 95 04/16/2020 1509   GLUCOSE 88 06/12/2014 1840   BUN 9 04/16/2020 1509   BUN 20 06/12/2014 1840   CREATININE 0.70 04/16/2020 1509   CREATININE 0.97 06/12/2014 1840   CALCIUM 9.8 04/16/2020 1509   CALCIUM 9.0 06/12/2014 1840   GFRNONAA >60 10/05/2017 1113   GFRNONAA >60 06/12/2014 1840   GFRAA >60 10/05/2017 1113   GFRAA >60 06/12/2014 1840    Lipid Panel     Component Value Date/Time   CHOL 231 (H) 03/08/2016 1243   TRIG 129.0 03/08/2016 1243   HDL 42.40 03/08/2016 1243   CHOLHDL 5 03/08/2016 1243   VLDL 25.8 03/08/2016 1243   LDLCALC 162 (H) 03/08/2016 1243    CBC    Component Value Date/Time   WBC 6.3 04/16/2020 1509   RBC 3.97 04/16/2020 1509   HGB 12.8 04/16/2020 1509   HGB 12.9 06/12/2014 1840   HCT 36.9 04/16/2020 1509   HCT 38.3 06/12/2014 1840   PLT 277.0 04/16/2020 1509   PLT 300 06/12/2014 1840   MCV 92.8 04/16/2020 1509   MCV 93 06/12/2014 1840   MCH 29.8 11/23/2018 1343   MCHC 34.7 04/16/2020 1509   RDW 13.7 04/16/2020 1509   RDW 12.4 06/12/2014 1840   LYMPHSABS 1.4 11/23/2018 1343   MONOABS 0.5 11/23/2018 1343   EOSABS 0.1 11/23/2018 1343   BASOSABS 0.0 11/23/2018 1343    Hgb A1C No results found for: HGBA1C           Assessment & Plan:   Oral Ulceration:  Rx for Magic mouthwash 4 times a day x5 days  Return precautions discussed Webb Silversmith, NP This visit occurred during the SARS-CoV-2 public health emergency.  Safety protocols were in place, including screening questions prior to the visit, additional usage of staff PPE, and extensive cleaning of exam room while  observing appropriate contact time as indicated for disinfecting solutions.

## 2020-05-18 NOTE — Telephone Encounter (Signed)
Patient called requesting a refill  Name of Medication: Hydrocodone Name of Pharmacy: [facility] or Written Date and Quantity: 02/10/20 #90 Last Office Visit and Type: 04/16/20 CPE Next Office Visit and Type: None scheduled Last Controlled Substance Agreement Date: 06/12/19 Last UDS: 04/16/20

## 2020-05-19 ENCOUNTER — Encounter: Payer: Self-pay | Admitting: Internal Medicine

## 2020-05-19 NOTE — Patient Instructions (Signed)
Oral Ulcers Oral ulcers are small sores inside the mouth or near the mouth. They may occur on or inside the lips, inside the cheeks, on the tongue, or anywhere else inside or near the mouth. They may be called canker sores or cold sores, which are two types of oral ulcers. Many oral ulcers are harmless and go away on their own. In some cases, oral ulcers may require medical care to determine the cause and proper treatment. What are the causes? Common causes of this condition include: Infections caused by viruses, bacteria, or fungi. Emotional stress. Foods or chemicals that irritate the mouth. Injury or physical irritation of the mouth. Medicines. Allergies. Tobacco use. Less common causes include: Skin disease. A type of herpes virus infection (herpes simplex or herpes zoster). Oral cancer. In some cases, the cause may not be known. What increases the risk? You are more likely to develop this condition if: You wear dental braces, dentures, or retainers. You have poor oral hygiene. You have sensitive skin. You have a condition that affects the entire body (systemic condition), such as an immune disorder. What are the signs or symptoms? The main symptom of this condition is having one or more oval-shaped or round ulcers that have red borders. Symptoms may vary depending on the cause. This includes: Location of the ulcers. Ulcers may be found inside the mouth, on the gums, or on the insides of the lips or cheeks. They may also be found on the lips or on skin that is near the mouth, such as the cheeks or chin. Pain. Ulcers can be painful and uncomfortable, or they can be painless. Appearance of the ulcers. They may look like red blisters and be filled with fluid, or they may be white or yellow patches. Frequency of outbreaks. Ulcers may go away permanently after one outbreak, or they may come back (recur) often or rarely.   How is this diagnosed? This condition is diagnosed with a physical  exam. Your health care provider may ask you questions about your lifestyle and your medical history. You may have tests, including: Blood tests. Removal of a small number of cells from an ulcer to be examined under a microscope (biopsy). How is this treated? Treatment depends on the severity and cause of the condition. Oral ulcers often go away on their own in 1-2 weeks. Treatment may include medicines, such as: Medicines to treat a viral infection (antivirals), a bacterial infection (antibiotics), or a fungal infection (antifungals). Medicines to help control pain. This may include: Over-the-counter pain medicines. Gel, cream, or spray to numb the area (topical anesthetic) if you have severe pain. Other medicines to coat or numb your mouth. Follow these instructions at home: Medicines Take or use over-the-counter and prescription medicines only as told by your health care provider. If you were prescribed an antibiotic medicine, take it as told by your health care provider. Do not stop taking the antibiotic even if you start to feel better. Do not use products that contain benzocaine (including numbing gels) to treat teething or mouth pain in children who are younger than 2 years. These products may cause a rare but serious blood condition. Eating and drinking Eat a balanced diet. Do not eat: Spicy foods. Citrus, such as oranges. Other foods and drinks that you think may cause or irritate your ulcers. Drink enough fluid to keep your urine pale yellow. Avoid drinking alcohol. Lifestyle Practice good oral hygiene: Gently brush your teeth with a soft toothbrush two times a day.   Floss your teeth every day. Get regular dental cleanings and checkups. Do not use any products that contain nicotine or tobacco, such as cigarettes and e-cigarettes. If you need help quitting, ask your health care provider.   Managing pain If directed, put ice on your face in the affected area to help reduce  pain. Put ice in a plastic bag. Place a towel between your skin and the bag. Leave the ice on for 20 minutes, 2-3 times a day. Avoid physical or chemical irritants that may have caused the ulcers or made them worse, such as mouthwashes that contain alcohol (ethanol). If you wear dental braces, dentures, or retainers, work with your health care provider to make sure these devices are fitted correctly. If you were prescribed a prescription mouthwash to help reduce pain in your mouth, use it as told by your health care provider. General instructions Rinse with a salt-water mixture 3-4 times a day or as told by your health care provider. To make a salt-water mixture, completely dissolve -1 tsp (3-6 g) of salt in 1 cup (237 mL) of warm water. Keep all follow-up visits as told by your health care provider. This is important. Contact a health care provider if: You have: Pain that gets worse or does not get better with medicine. Four or more ulcers at one time. A fever. New ulcers that look or feel different from other ulcers you have. Inflammation in one eye or both eyes. Ulcers that do not go away after 10 days. You develop new symptoms in your mouth, such as: Bleeding or crusting around your lips or gums. Tooth pain. Difficulty swallowing. You develop symptoms on your skin or genitals, such as: A rash or blisters. Burning or itching sensations. Your ulcers begin or get worse after you start a new medicine. Get help right away if you have: Difficulty breathing. Swelling in your face or neck. Excessive bleeding from your mouth. Severe pain. Summary Oral ulcers may occur anywhere inside or near the mouth. They can be caused by many things, such as infections, stress, injury or irritation, or tobacco use. Oral ulcers can be painful or painless. Treatment may include medicines to relieve pain or to treat an infection (if appropriate). Most oral ulcers go away in 1-2 weeks. This information  is not intended to replace advice given to you by your health care provider. Make sure you discuss any questions you have with your health care provider. Document Revised: 07/13/2017 Document Reviewed: 07/13/2017 Elsevier Patient Education  2021 Elsevier Inc.  

## 2020-05-29 ENCOUNTER — Telehealth: Payer: Self-pay | Admitting: Internal Medicine

## 2020-05-29 MED ORDER — MAGIC MOUTHWASH W/LIDOCAINE: 5.0000 mL | Freq: Four times a day (QID) | ORAL | 0 refills | Status: DC | PRN

## 2020-05-29 NOTE — Telephone Encounter (Signed)
Left message for pt. Rx faxed to [facility].

## 2020-05-29 NOTE — Telephone Encounter (Signed)
Pt called in wanted to know about getting a refill for a mouthwash.  LAST APPOINTMENT DATE: 05/18/2020   NEXT APPOINTMENT DATE:@Visit  date not found  MEDICATION:magic mouthwash   PH[facility] [address]  Let patient know to contact pharmacy at the end of the day to make sure medication is ready.  Please notify patient to allow 48-72 hours to process  Encourage patient to contact the pharmacy for refills or they can request refills through [facility]:   LAST REFILL:  QTY:  REFILL DATE:    OTHER COMMENTS:    Okay for refill?  Please advise

## 2020-06-04 ENCOUNTER — Other Ambulatory Visit: Payer: Self-pay | Admitting: Internal Medicine

## 2020-06-04 DIAGNOSIS — M797 Fibromyalgia: Secondary | ICD-10-CM | POA: Diagnosis not present

## 2020-06-04 DIAGNOSIS — M8949 Other hypertrophic osteoarthropathy, multiple sites: Secondary | ICD-10-CM | POA: Diagnosis not present

## 2020-06-12 NOTE — Telephone Encounter (Signed)
Patient called. Patient needs a refill on Magic Mouthwash sent to [facility].  Patient is requesting a call when rx is sent in to pharmacy. Patient's phone number is [phone].

## 2020-06-13 NOTE — Telephone Encounter (Signed)
This will need to be faxed due to length of sig. Will send in Monday morning.

## 2020-06-15 MED ORDER — MAGIC MOUTHWASH W/LIDOCAINE: 5.0000 mL | Freq: Four times a day (QID) | ORAL | 0 refills | Status: DC | PRN

## 2020-06-15 NOTE — Telephone Encounter (Signed)
RX printed and signed placed on your desk to fax.

## 2020-06-15 NOTE — Telephone Encounter (Signed)
Rx faxed

## 2020-06-15 NOTE — Addendum Note (Signed)
Addended by: Jearld Fenton on: 06/15/2020 07:56 AM   Modules accepted: Orders

## 2020-06-30 ENCOUNTER — Other Ambulatory Visit: Payer: Self-pay | Admitting: Internal Medicine

## 2020-06-30 NOTE — Telephone Encounter (Signed)
Pt calling requesting refill on xanax; pt said she did not just call per Ashtyn's note and then pt said yes she has already called; pt still has couple of days of med.  Name of Medication: xanax 1 mg Name of Pharmacy: [facility] or Written Date and Quantity: # 14 on 04/29/20 Last Office Visit and Type: 04/16/20 medicare wellness Next Office Visit and Type: none scheduled Last Controlled Substance Agreement Date: 06/11/20 Last UDS:04/16/20

## 2020-06-30 NOTE — Telephone Encounter (Signed)
Pt called to follow up on refill.

## 2020-07-01 NOTE — Telephone Encounter (Signed)
Pt called to ck on status of xanax refill; [facility] not [facility]. Advised that Dr Silvio Pate had approved xanax refill to [facility]. Pt also asked if [facility] had contacted [place] about 3 other refills today and I advised I did not see any request and when asked pt the names of the meds pt said [facility] because may have been prescribed by other physicians. Pt will cb if needed to [facility].

## 2020-07-14 ENCOUNTER — Other Ambulatory Visit: Payer: Self-pay | Admitting: Internal Medicine

## 2020-07-14 DIAGNOSIS — Z1231 Encounter for screening mammogram for malignant neoplasm of breast: Secondary | ICD-10-CM

## 2020-07-15 ENCOUNTER — Other Ambulatory Visit: Payer: Self-pay

## 2020-07-15 ENCOUNTER — Ambulatory Visit (INDEPENDENT_AMBULATORY_CARE_PROVIDER_SITE_OTHER): Payer: Medicare Other | Admitting: Internal Medicine

## 2020-07-15 ENCOUNTER — Encounter: Payer: Self-pay | Admitting: Internal Medicine

## 2020-07-15 ENCOUNTER — Telehealth: Payer: Self-pay | Admitting: *Deleted

## 2020-07-15 VITALS — BP 124/76 | HR 70 | Temp 96.4°F | Ht 62.0 in | Wt 119.0 lb

## 2020-07-15 DIAGNOSIS — M797 Fibromyalgia: Secondary | ICD-10-CM | POA: Diagnosis not present

## 2020-07-15 DIAGNOSIS — R519 Headache, unspecified: Secondary | ICD-10-CM | POA: Diagnosis not present

## 2020-07-15 LAB — CBC
HCT: 36.5 % (ref 36.0–46.0)
Hemoglobin: 12.4 g/dL (ref 12.0–15.0)
MCHC: 34.1 g/dL (ref 30.0–36.0)
MCV: 91.4 fl (ref 78.0–100.0)
Platelets: 279 10*3/uL (ref 150.0–400.0)
RBC: 3.99 Mil/uL (ref 3.87–5.11)
RDW: 12.4 % (ref 11.5–15.5)
WBC: 5.4 10*3/uL (ref 4.0–10.5)

## 2020-07-15 LAB — SEDIMENTATION RATE: Sed Rate: 8 mm/hr (ref 0–30)

## 2020-07-15 MED ORDER — AMITRIPTYLINE HCL 25 MG PO TABS: 25.0000 mg | ORAL_TABLET | Freq: Every day | ORAL | 3 refills | Status: DC

## 2020-07-15 NOTE — Assessment & Plan Note (Signed)
Her overall pain is likely due to this ---long standing but worse Does see rheumatology--did try amitripyline but didn't notice improvement so stopped I have asked her to restart that--since 10mg  didn't help---will try 25mg  at bedtime Multiple allergies limit our choices

## 2020-07-15 NOTE — Telephone Encounter (Signed)
See my OV note from today

## 2020-07-15 NOTE — Progress Notes (Signed)
Subjective:    Patient ID: Lindsey Ward, female    DOB: 05-04-1950, 70 y.o.   MRN: [id number]  HPI Here due to pain in multiple places This visit occurred during the SARS-CoV-2 public health emergency.  Safety protocols were in place, including screening questions prior to the visit, additional usage of staff PPE, and extensive cleaning of exam room while observing appropriate contact time as indicated for disinfecting solutions.   Having severe temporal pain on the left Pain even just moving her hair Also feels it "on the inside" This goes back months--but is getting worse  Also with pain in posterior neck--especially on right  Notes swelling and pain in elbows, knees and ankles Winds up in bed 3-4 times a week due to pain  No fever No sweats--but some chills  Current Outpatient Medications on File Prior to Visit  Medication Sig Dispense Refill  . ALPRAZolam (XANAX) 1 MG tablet TAKE 1 TABLET BY MOUTH THREE TIMES A DAY AS NEEDED 90 tablet 0  . Carboxymethylcellul-Glycerin (REFRESH OPTIVE OP) Apply to eye. Gel drops lubricant eye gel    . cyclobenzaprine (FLEXERIL) 5 MG tablet TAKE 1 TABLET (5 MG TOTAL) BY MOUTH AT BEDTIME AS NEEDED FOR MUSCLE SPASMS. 30 tablet 2  . HYDROcodone-acetaminophen (NORCO/VICODIN) 5-325 MG tablet Take 1 tablet by mouth 3 (three) times daily as needed. Dx Code M79.7 90 tablet 0  . ketoconazole (NIZORAL) 2 % cream Apply 1 application topically 2 (two) times daily as needed for irritation. 60 g 1  . omeprazole (PRILOSEC) 20 MG capsule TAKE 1 CAPSULE BY MOUTH TWICE A DAY 180 capsule 3  . promethazine (PHENERGAN) 12.5 MG tablet TAKE 1-2 TABLETS (12.5-25 MG TOTAL) BY MOUTH 3 (THREE) TIMES DAILY AS NEEDED FOR NAUSEA OR VOMITING. 60 tablet 0  . SUMAtriptan (IMITREX) 50 MG tablet TAKE 0.5-1 TABLET BY MOUTH DAILY AS NEEDED FOR MIGRAINE. MAX PER INS. 9 tablet 11  . triamcinolone cream (KENALOG) 0.1 % APPLY 1 APPLICATION TOPICALLY 2 (TWO) TIMES DAILY AS NEEDED. 45 g 1   . cetirizine (ZYRTEC) 10 MG tablet TAKE 1 TABLET BY MOUTH EVERY DAY (Patient not taking: Reported on 07/15/2020) 30 tablet 11  . magic mouthwash w/lidocaine SOLN Take 5 mLs by mouth 4 (four) times daily as needed for mouth pain. (Patient not taking: Reported on 07/15/2020) 240 mL 0   No current facility-administered medications on file prior to visit.    Allergies  Allergen Reactions  . Tizanidine Other (See Comments)    "throws me in a different world"  . Aspirin     REACTION: GI bleed  . Buspirone Hcl     REACTION: unspecified  . Ciprofloxacin     REACTION: unspecified  . Citalopram Other (See Comments)    Ear ringing.   . Codeine   . Diazepam   . Duloxetine     REACTION: sz like activity  . Ibuprofen     REACTION: GI bleed  . Imipramine Hcl Other (See Comments)    Ear ringing.   . Oxycodone-Acetaminophen     REACTION: unspecified  . Pregabalin     REACTION: kept her up and confusion  . Propoxyphene Hcl   . Risperidone     REACTION: confusion  . Sulfonamide Derivatives     REACTION: unspecified  . Tramadol Hcl     REACTION: doesn't remember what happened but couldn't tolerate  . Tramadol Hcl Other (See Comments)    REACTION: doesn't remember what happened but couldn't tolerate REACTION:  doesn't remember what happened but couldn't tolerate REACTION: doesn't remember what happened but couldn't tolerate   . Venlafaxine     REACTION: unspecified  . Methocarbamol Rash    Past Medical History:  Diagnosis Date  . Bowel obstruction ([place])   . Chronic fatigue   . Depression   . Fibromyalgia   . GERD (gastroesophageal reflux disease)   . Hyperlipemia   . Migraine   . Obstipation   . Osteopenia   . PUD (peptic ulcer disease)     Past Surgical History:  Procedure Laterality Date  . ABDOMINAL SURGERY    . APPENDECTOMY  2005  . BREAST BIOPSY Bilateral 1999   rt w/out clip - neg, lt w/clip - neg  . BREAST CYST ASPIRATION Right 2003   neg  . CHOLECYSTECTOMY  2004   . COLONOSCOPY WITH PROPOFOL N/A 06/09/2016   Procedure: COLONOSCOPY WITH PROPOFOL;  Surgeon: Jonathon Bellows, MD;  Location[facility] ENDOSCOPY;  Service: Endoscopy;  Laterality: N/A;  . ESOPHAGOGASTRODUODENOSCOPY (EGD) WITH PROPOFOL N/A 06/09/2016   Procedure: ESOPHAGOGASTRODUODENOSCOPY (EGD) WITH PROPOFOL;  Surgeon: Jonathon Bellows, MD;  Location: [facility] ENDOSCOPY;  Service: Endoscopy;  Laterality: N/A;  . EYE SURGERY    . GIVENS CAPSULE STUDY N/A 06/23/2016   Procedure: GIVENS CAPSULE STUDY;  Surgeon: Jonathon Bellows, MD;  Location: [facility] ENDOSCOPY;  Service: Endoscopy;  Laterality: N/A;  . PARS PLANA VITRECTOMY W/ REPAIR OF MACULAR HOLE Left 01/2017   [facility]  . TOTAL ABDOMINAL HYSTERECTOMY W/ BILATERAL SALPINGOOPHORECTOMY  1986    Family History  Problem Relation Age of Onset  . Heart disease Mother        AMI  . Stroke Mother   . Heart disease Father   . Hypertension Sister   . Breast cancer Sister   . Hypertension Sister   . Heart disease Sister        MI  . Diabetes Sister   . Kidney cancer Neg Hx   . Kidney disease Neg Hx   . Prostate cancer Neg Hx     Social History   Socioeconomic History  . Marital status: Single    Spouse name: Not on file  . Number of children: 1  . Years of education: Not on file  . Highest education level: Not on file  Occupational History  . Occupation: DISABLED  Tobacco Use  . Smoking status: Former Smoker    Packs/day: 1.00    Years: 40.00    Pack years: 40.00    Types: Cigarettes    Quit date: 09/25/2013    Years since quitting: 6.8  . Smokeless tobacco: Never Used  . Tobacco comment:    Vaping Use  . Vaping Use: Never used  Substance and Sexual Activity  . Alcohol use: Not Currently    Alcohol/week: 0.0 standard drinks    Comment: rarely  . Drug use: No  . Sexual activity: Not on file  Other Topics Concern  . Not on file  Social History Narrative   Living alone again      No living will   Daughter should make health care decisions   Requests  DNR---done 10/24/14   No tube feeds if cognitively unaware   Highest level of education: 9th   Social Determinants of Health   Financial Resource Strain: Not on file  Food Insecurity: Not on file  Transportation Needs: Not on file  Physical Activity: Not on file  Stress: Not on file  Social Connections: Not on file  Intimate Partner Violence:  Not on file   Review of Systems No vision change No pain with chewing or eating No cough or SOB Some nausea--no vomiting Eating fair---"whenever I can eat" Not really depressed    Objective:   Physical Exam Constitutional:      Appearance: Normal appearance.  HENT:     Head:     Comments: No temporal bruits Sensitivity on left temple Neck:     Comments: No tenderness Cardiovascular:     Rate and Rhythm: Normal rate and regular rhythm.     Heart sounds: No murmur heard. No gallop.   Pulmonary:     Effort: Pulmonary effort is normal.     Breath sounds: Normal breath sounds. No wheezing or rales.  Musculoskeletal:     Cervical back: Neck supple.  Lymphadenopathy:     Cervical: No cervical adenopathy.  Neurological:     Mental Status: She is alert.            Assessment & Plan:

## 2020-07-15 NOTE — Telephone Encounter (Signed)
Patient called stating that she started with symptoms about two weeks ago hoping that they would go away. . Patient stated that the back left side of her head hurts for her to even move her hair. Patient stated that she is hurting all over and her elbows and knees are swollen. Patient stated that her symptoms have gotten a lot worse in the past two days. Patient stated that she has been having some blurred vision and her tongue started going numb last night. Patient denies any slurred speak or weakness on either side of her body.  After speaking with Dr. Silvio Pate patient was advised that he will work her in today at 12:30 pm.. Patient was given 911 precautions and she verbalized understanding.

## 2020-07-15 NOTE — Assessment & Plan Note (Signed)
Has had total body symptoms for 6-7 months Now with left temple pain and sensitivity Past TMJ (and had implants) but no jaw claudication or vision loss Doubt temporal arteritis with normal sed rate in February as well--but will recheck If elevated, will start prednisone 60mg  daily and set up temporal biopsy Otherwise has arthralgias without clear cut synovitis

## 2020-07-20 DIAGNOSIS — M159 Polyosteoarthritis, unspecified: Secondary | ICD-10-CM | POA: Diagnosis not present

## 2020-07-20 DIAGNOSIS — M797 Fibromyalgia: Secondary | ICD-10-CM | POA: Diagnosis not present

## 2020-07-20 DIAGNOSIS — M5136 Other intervertebral disc degeneration, lumbar region: Secondary | ICD-10-CM | POA: Diagnosis not present

## 2020-07-29 ENCOUNTER — Other Ambulatory Visit: Payer: Self-pay | Admitting: Family Medicine

## 2020-07-29 ENCOUNTER — Other Ambulatory Visit (HOSPITAL_COMMUNITY): Payer: Self-pay | Admitting: Emergency Medicine

## 2020-07-29 ENCOUNTER — Other Ambulatory Visit (HOSPITAL_COMMUNITY): Payer: Self-pay | Admitting: Family Medicine

## 2020-07-29 DIAGNOSIS — G8929 Other chronic pain: Secondary | ICD-10-CM

## 2020-07-29 DIAGNOSIS — M545 Low back pain, unspecified: Secondary | ICD-10-CM

## 2020-07-29 DIAGNOSIS — M5442 Lumbago with sciatica, left side: Secondary | ICD-10-CM | POA: Diagnosis not present

## 2020-07-29 DIAGNOSIS — M542 Cervicalgia: Secondary | ICD-10-CM | POA: Diagnosis not present

## 2020-07-29 DIAGNOSIS — M5441 Lumbago with sciatica, right side: Secondary | ICD-10-CM | POA: Diagnosis not present

## 2020-07-29 DIAGNOSIS — M5412 Radiculopathy, cervical region: Secondary | ICD-10-CM | POA: Diagnosis not present

## 2020-07-29 DIAGNOSIS — M5030 Other cervical disc degeneration, unspecified cervical region: Secondary | ICD-10-CM | POA: Diagnosis not present

## 2020-08-07 ENCOUNTER — Ambulatory Visit
Admission: RE | Admit: 2020-08-07 | Discharge: 2020-08-07 | Disposition: A | Discharge status: Home / Self Care | Payer: Medicare Other | Source: Ambulatory Visit | Attending: Internal Medicine | Admitting: Internal Medicine

## 2020-08-07 ENCOUNTER — Other Ambulatory Visit: Payer: Self-pay

## 2020-08-07 DIAGNOSIS — Z1231 Encounter for screening mammogram for malignant neoplasm of breast: Secondary | ICD-10-CM | POA: Insufficient documentation

## 2020-08-11 ENCOUNTER — Ambulatory Visit
Admission: RE | Admit: 2020-08-11 | Discharge: 2020-08-11 | Disposition: A | Discharge status: Home / Self Care | Payer: Medicare Other | Source: Ambulatory Visit | Attending: Family Medicine | Admitting: Family Medicine

## 2020-08-11 DIAGNOSIS — G8929 Other chronic pain: Secondary | ICD-10-CM | POA: Diagnosis not present

## 2020-08-11 DIAGNOSIS — M545 Low back pain, unspecified: Secondary | ICD-10-CM | POA: Insufficient documentation

## 2020-08-11 DIAGNOSIS — M5441 Lumbago with sciatica, right side: Secondary | ICD-10-CM | POA: Diagnosis not present

## 2020-08-11 DIAGNOSIS — M5442 Lumbago with sciatica, left side: Secondary | ICD-10-CM | POA: Insufficient documentation

## 2020-08-12 DIAGNOSIS — M5030 Other cervical disc degeneration, unspecified cervical region: Secondary | ICD-10-CM | POA: Diagnosis not present

## 2020-08-12 DIAGNOSIS — G8929 Other chronic pain: Secondary | ICD-10-CM | POA: Diagnosis not present

## 2020-08-12 DIAGNOSIS — M5136 Other intervertebral disc degeneration, lumbar region: Secondary | ICD-10-CM | POA: Diagnosis not present

## 2020-08-12 DIAGNOSIS — M5416 Radiculopathy, lumbar region: Secondary | ICD-10-CM | POA: Diagnosis not present

## 2020-08-12 DIAGNOSIS — M5441 Lumbago with sciatica, right side: Secondary | ICD-10-CM | POA: Diagnosis not present

## 2020-08-12 DIAGNOSIS — M5412 Radiculopathy, cervical region: Secondary | ICD-10-CM | POA: Diagnosis not present

## 2020-08-12 DIAGNOSIS — M5442 Lumbago with sciatica, left side: Secondary | ICD-10-CM | POA: Diagnosis not present

## 2020-08-21 ENCOUNTER — Other Ambulatory Visit: Payer: Self-pay | Admitting: Internal Medicine

## 2020-08-21 DIAGNOSIS — M5416 Radiculopathy, lumbar region: Secondary | ICD-10-CM | POA: Diagnosis not present

## 2020-08-21 DIAGNOSIS — M48062 Spinal stenosis, lumbar region with neurogenic claudication: Secondary | ICD-10-CM | POA: Diagnosis not present

## 2020-08-26 ENCOUNTER — Other Ambulatory Visit: Payer: Self-pay

## 2020-08-26 MED ORDER — CYCLOBENZAPRINE HCL 5 MG PO TABS: 5.0000 mg | ORAL_TABLET | Freq: Every evening | ORAL | 2 refills | Status: DC | PRN

## 2020-08-26 NOTE — Telephone Encounter (Signed)
LAST APPOINTMENT DATE: 07/15/20   NEXT APPOINTMENT DATE: Visit date not found    LAST REFILL:10/07/2019   .

## 2020-08-31 ENCOUNTER — Other Ambulatory Visit: Payer: Self-pay | Admitting: Internal Medicine

## 2020-08-31 NOTE — Telephone Encounter (Signed)
Last filled 07-01-20 #90 Last OV 07-15-20 No Future OV  [facility]

## 2020-09-11 DIAGNOSIS — M62830 Muscle spasm of back: Secondary | ICD-10-CM | POA: Diagnosis not present

## 2020-09-11 DIAGNOSIS — M5412 Radiculopathy, cervical region: Secondary | ICD-10-CM | POA: Diagnosis not present

## 2020-09-11 DIAGNOSIS — M5030 Other cervical disc degeneration, unspecified cervical region: Secondary | ICD-10-CM | POA: Diagnosis not present

## 2020-09-11 DIAGNOSIS — M5136 Other intervertebral disc degeneration, lumbar region: Secondary | ICD-10-CM | POA: Diagnosis not present

## 2020-09-11 DIAGNOSIS — M48062 Spinal stenosis, lumbar region with neurogenic claudication: Secondary | ICD-10-CM | POA: Diagnosis not present

## 2020-09-11 DIAGNOSIS — M5416 Radiculopathy, lumbar region: Secondary | ICD-10-CM | POA: Diagnosis not present

## 2020-09-18 IMAGING — US ULTRASOUND RIGHT BREAST LIMITED
1 series · 7 of 7 positions shown · non-contrast
Comparison: Previous exam(s).

CLINICAL DATA: 67-year-old female for six-month follow-up of RIGHT
breast mass.

EXAM:
DIGITAL DIAGNOSTIC RIGHT MAMMOGRAM WITH CAD AND TOMO
ULTRASOUND RIGHT BREAST

[Series 1: ultrasound right breast limited · 0.05mm/px · 7 of 7 slices shown]
[im 1/7]
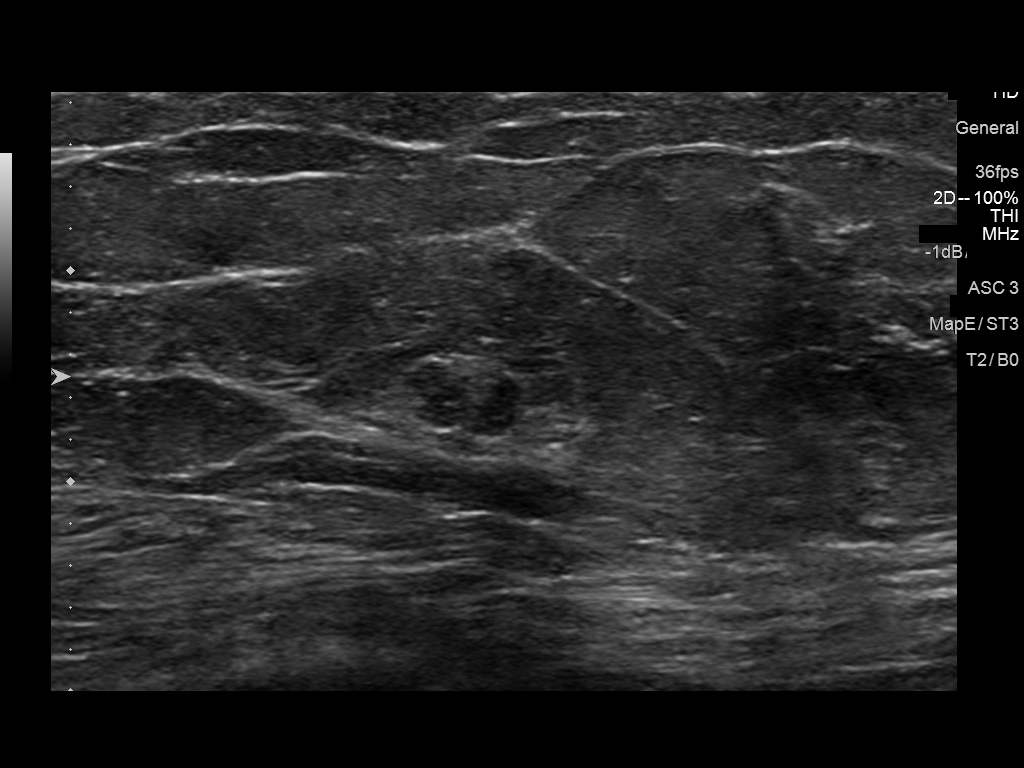
[im 2/7]
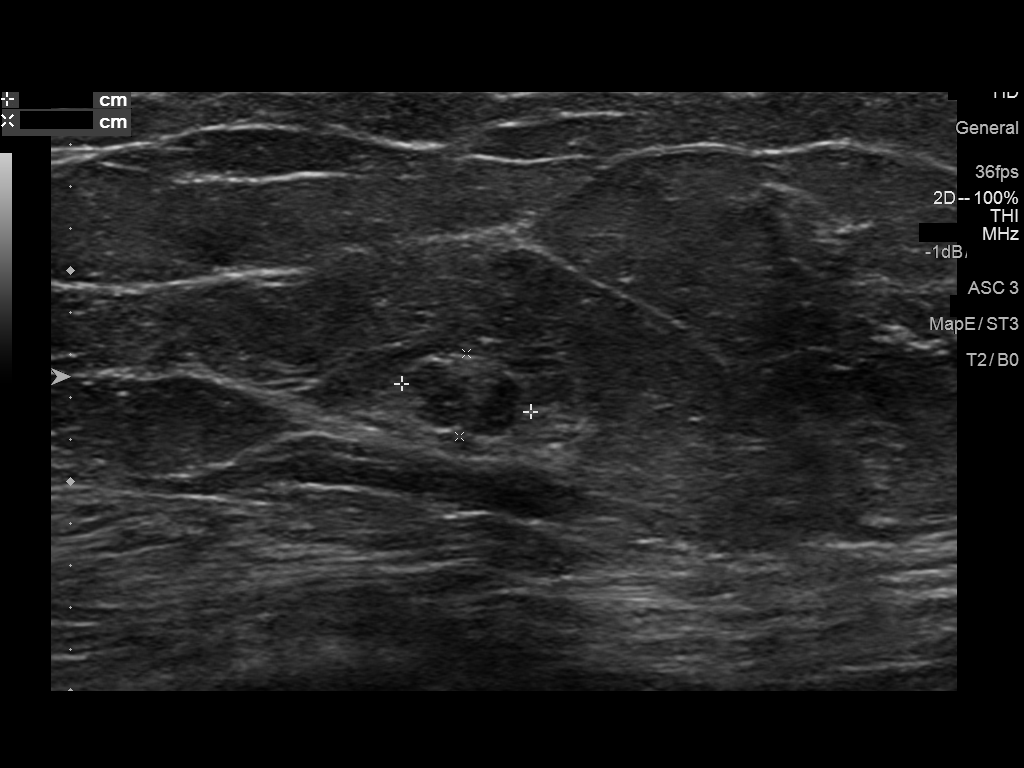
[im 3/7]
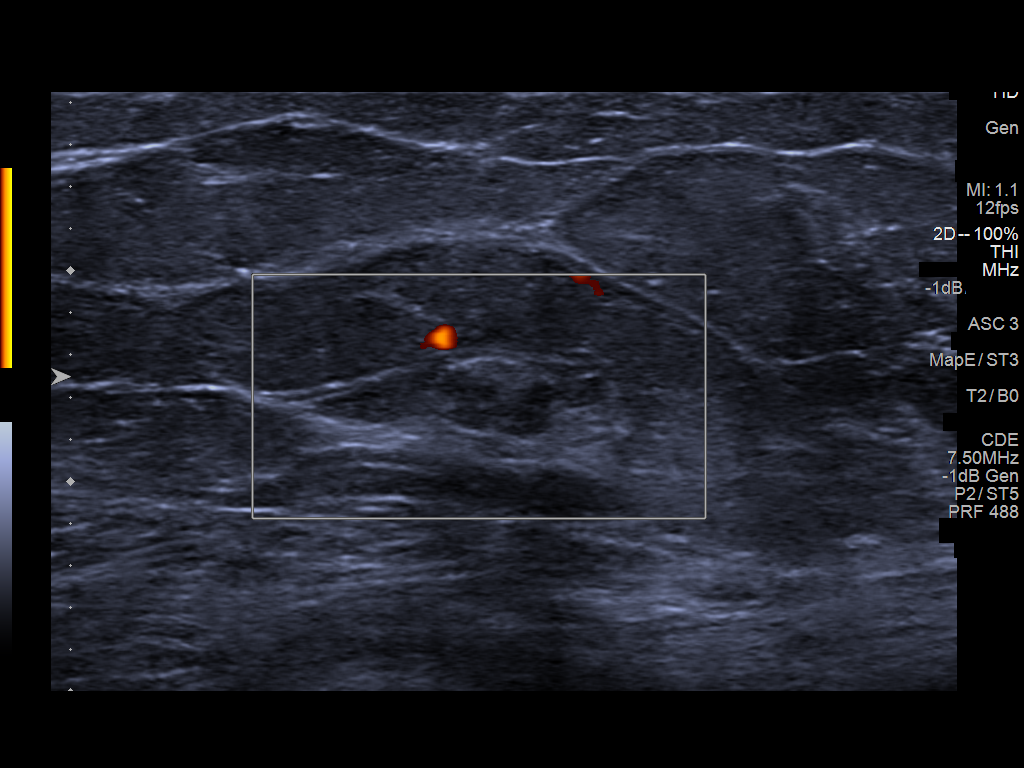
[im 4/7]
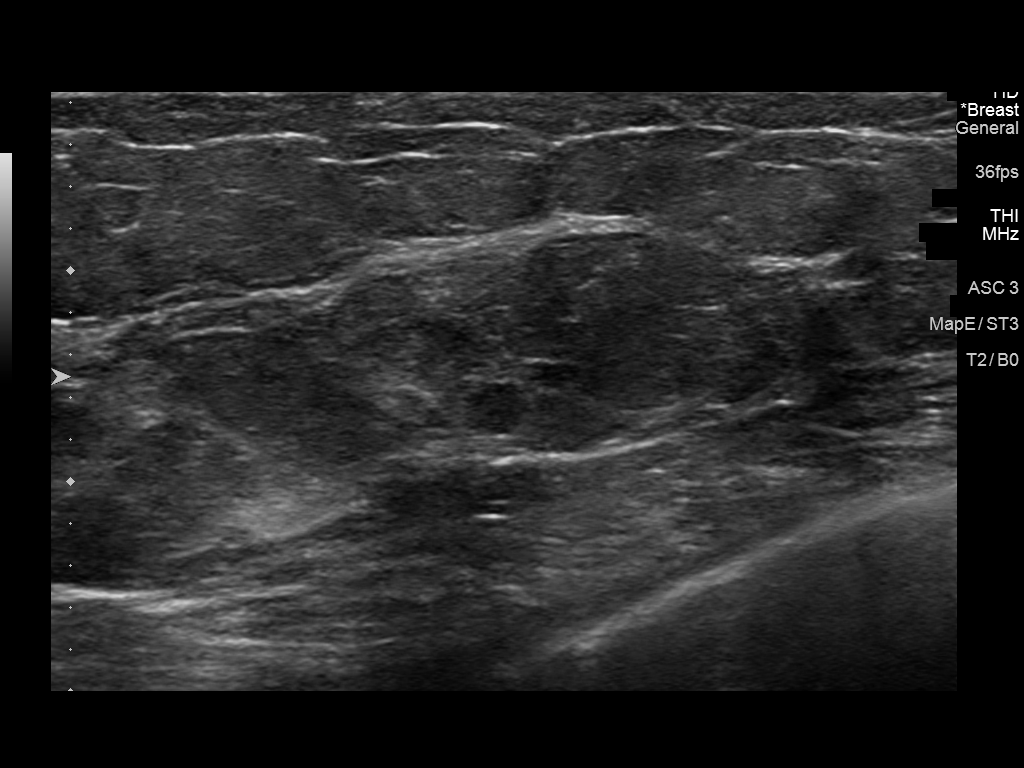
[im 5/7]
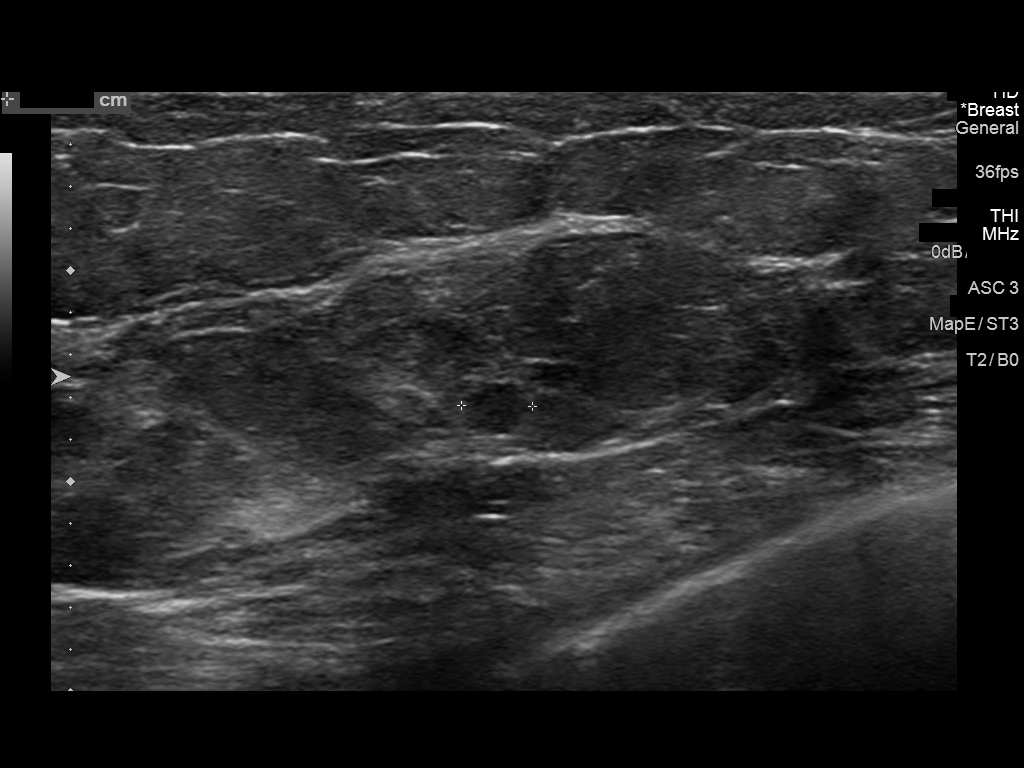
[im 6/7]
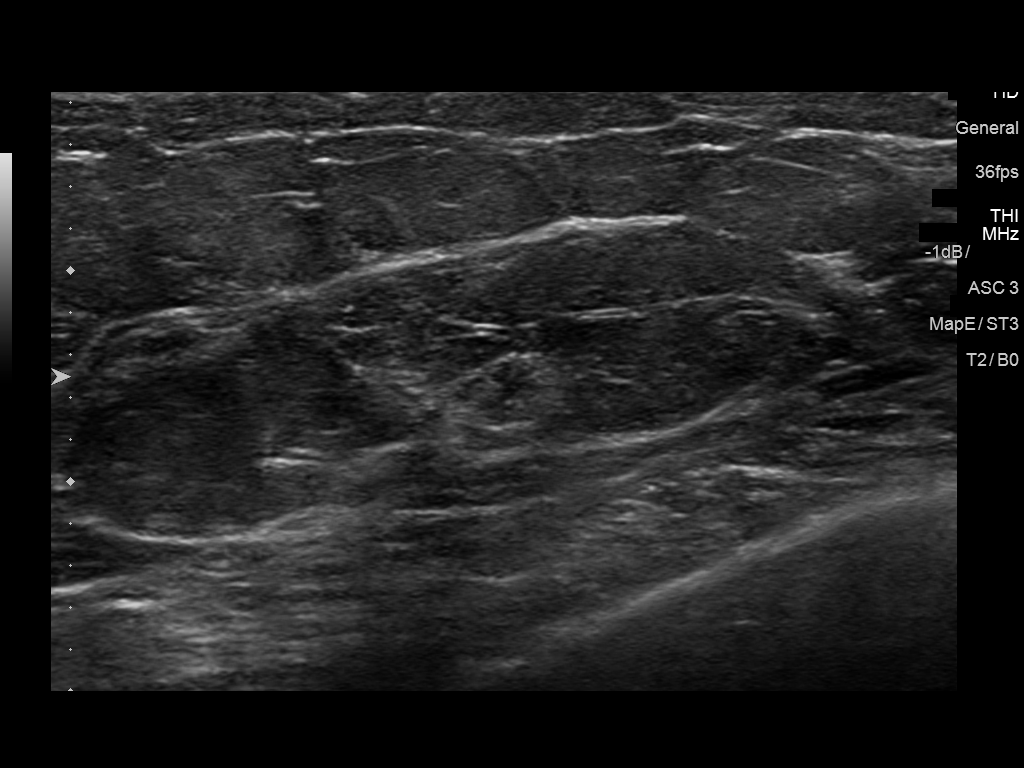
[im 7/7]
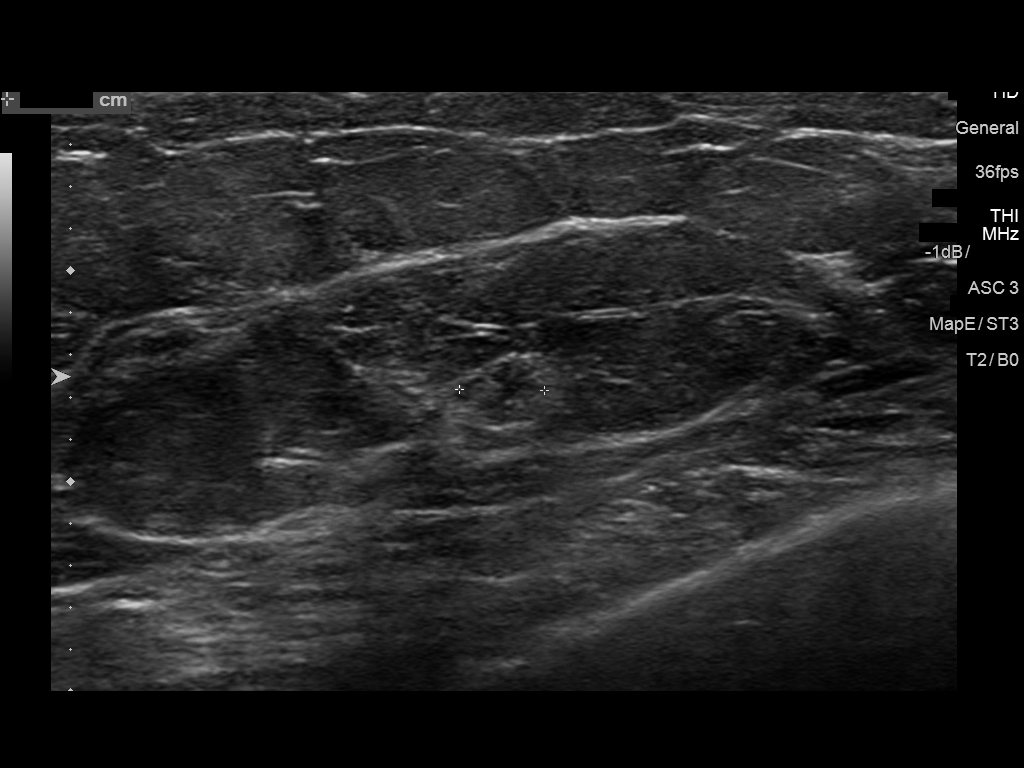

[7 of 7 positions shown; findings below may reference images not displayed]

ACR Breast Density Category b: There are scattered areas of
fibroglandular density.
FINDINGS: 2D/3D full field views of the RIGHT breast demonstrate a stable
circumscribed oval low-density mass within the far posterior UPPER
RIGHT breast.

No new mass, distortion or worrisome calcifications noted.

Mammographic images were processed with CAD.

Targeted ultrasound is performed, showing a stable 6 x 4 x 3 mm
mixed echogenic oval area at the 12 o'clock position of the RIGHT
breast 7 cm from the nipple. No other solid or cystic mass,
distortion or abnormal shadowing is identified within the UPPER or
INNER RIGHT breast.
IMPRESSION: 1. Stable likely benign changes within the posterior UPPER RIGHT
breast. Six-month follow-up recommended to ensure 1 year stability.

RECOMMENDATION:
Bilateral diagnostic mammogram with RIGHT breast ultrasound in 6
months

I have discussed the findings and recommendations with the patient.
Results were also provided in writing at the conclusion of the
visit. If applicable, a reminder letter will be sent to the patient
regarding the next appointment.

BI-RADS CATEGORY  3: Probably benign.

## 2020-09-24 ENCOUNTER — Other Ambulatory Visit: Payer: Self-pay | Admitting: *Deleted

## 2020-09-24 MED ORDER — HYDROCODONE-ACETAMINOPHEN 5-325 MG PO TABS: 1.0000 | ORAL_TABLET | Freq: Three times a day (TID) | ORAL | 0 refills | Status: DC | PRN

## 2020-09-24 NOTE — Telephone Encounter (Signed)
Name of Medication: norco 5-325 mg Name of Pharmacy: [facility] [address] Last Venida Jarvis or Written Date and Quantity: 05/18/20 #90 tabs/0 refill Webb Silversmith, NP filled) Last Office Visit and Type: "Pain all over" 07/15/20 Next Office Visit and Type: none scheduled  Last Controlled Substance Agreement Date: 04/16/20 Last UDS:04/16/20

## 2020-10-07 ENCOUNTER — Ambulatory Visit (INDEPENDENT_AMBULATORY_CARE_PROVIDER_SITE_OTHER): Payer: Medicare Other | Admitting: Family Medicine

## 2020-10-07 ENCOUNTER — Encounter: Payer: Self-pay | Admitting: Family Medicine

## 2020-10-07 ENCOUNTER — Telehealth: Payer: Self-pay

## 2020-10-07 ENCOUNTER — Other Ambulatory Visit: Payer: Self-pay

## 2020-10-07 VITALS — BP 126/72 | HR 68 | Temp 97.7°F | Ht 62.0 in | Wt 122.0 lb

## 2020-10-07 DIAGNOSIS — G8929 Other chronic pain: Secondary | ICD-10-CM

## 2020-10-07 DIAGNOSIS — M25551 Pain in right hip: Secondary | ICD-10-CM

## 2020-10-07 DIAGNOSIS — M549 Dorsalgia, unspecified: Secondary | ICD-10-CM | POA: Diagnosis not present

## 2020-10-07 DIAGNOSIS — M5137 Other intervertebral disc degeneration, lumbosacral region: Secondary | ICD-10-CM

## 2020-10-07 MED ORDER — PREDNISONE 20 MG PO TABS: ORAL_TABLET | ORAL | 0 refills | Status: DC

## 2020-10-07 NOTE — Telephone Encounter (Signed)
Pt has appt today with DR  Copland.

## 2020-10-07 NOTE — Progress Notes (Signed)
Haidan Nhan T. Tyreck Bell, MD, [place] at [facility] [place], [address]  Phone: [phone]  FAX: [place] - 70 y.o. female  MRN [id number]  Date of Birth: 1950/06/01  Date: 10/07/2020  PCP: Venia Carbon, MD  Referral: Venia Carbon, MD  Chief Complaint  Patient presents with   Hip Pain    This visit occurred during the SARS-CoV-2 public health emergency.  Safety protocols were in place, including screening questions prior to the visit, additional usage of staff PPE, and extensive cleaning of exam room while observing appropriate contact time as indicated for disinfecting solutions.   Subjective:   AMRITHA SALZILLO is a 70 y.o. very pleasant female patient with Body mass index is 22.31 kg/m. who presents with the following:  She comes in with a description of hip pain, but she does not have any anterior pain, anterior groin pain, she complains of the right side posterior buttocks pain.  DDD with Lumbar Radiculopathy, she has been seeing Dr. Sharlet Salina and Mrs. Whitney multiple times recently.  Extensive chart review in this case.  She also has a diagnosis of Fibromyagia and sees Rheumatology x 20 years.  She takes Norco 5 TID. Flexeril and Xanax.  ESI 2021. 08/2020 did not go to PT 09/11/2020: Bilateral L5 trigger point injections 08/21/2020: Bilateral L4-5 transforaminal ESI (mild improvement of low back pain improvement of bilateral lower extremity pain)   Acute back pain that flared. No hip pain.   Has tried some heat on it and has lied down.  Trried some icy hot.  No Norco yesterday.   Right now she is having some significant discomfort, and she wanted some advice or any suggestion that I could make to help.  Review of Systems is noted in the HPI, as appropriate   Objective:   BP 126/72   Pulse 68   Temp 97.7 F (36.5 C) (Temporal)   Ht '5\' 2"'$  (1.575 m)   Wt 122 lb (55.3  kg)   SpO2 96%   BMI 22.31 kg/m    HIP EXAM: SIDE: Bilateral ROM: Abduction, Flexion, Internal and External range of motion: Full Pain with terminal IROM and EROM: None GTB: NT SLR: NEG Knees: No effusion Piriformis: NT at direct palpation Str: flexion: 5/5 abduction: 5/5 adduction: 5/5 Strength testing non-tender    Range of motion at  the waist: Flexion, extension, lateral bending and rotation: She is limited in forward flexion to 65 degrees, extension does cause some pain, but lateral bending and rotational maneuvers are not significantly tender.  No echymosis or edema Rises to examination table with mild difficulty Gait: minimally antalgic  Inspection/Deformity: N Paraspinus Tenderness: Right greater than left L4-S1.  She also has pain in the top of the pelvis and to a lesser extent centrally.  B Ankle Dorsiflexion (L5,4): 5/5 B Great Toe Dorsiflexion (L5,4): 5/5 Heel Walk (L5): WNL Toe Walk (S1): WNL Rise/Squat (L4): WNL, mild pain  SENSORY B Medial Foot (L4): WNL B Dorsum (L5): WNL B Lateral (S1): WNL Light Touch: WNL Pinprick: WNL  REFLEXES Knee (L4): 2+ Ankle (S1): 2+  B SLR, seated: neg B SLR, supine: neg B FABER: neg B Reverse FABER: neg B Greater Troch: NT B Log Roll: neg B Sciatic Notch: NT   Radiology: No results found.  Assessment and Plan:     ICD-10-CM   1. Chronic back pain greater than 3 months duration  M54.9  G89.29     2. DDD (degenerative disc disease), lumbosacral  M51.37     3. Right hip pain  M25.551      Acute on chronic back pain with exacerbation which has been present for multiples of years.  This is not hip pain, not consistent with intra-articular hip pain.  I gave the patient a burst of some steroids.  She has been followed by physiatry and rheumatology long-term.  I think that physiatry could offer her some continued beneficial treatment and advise.  I do not think I have much to add to this case.  Hopefully a  burst of some steroids will calm things down acutely.  Meds ordered this encounter  Medications   predniSONE (DELTASONE) 20 MG tablet    Sig: 2 tabs po daily for 5 days, then 1 tab po daily for 5 days    Dispense:  15 tablet    Refill:  0   Medications Discontinued During This Encounter  Medication Reason   magic mouthwash w/lidocaine SOLN Error   cetirizine (ZYRTEC) 10 MG tablet Error   Follow-up not needed.  Signed,  Maud Deed. Raylynn Hersh, MD   Outpatient Encounter Medications as of 10/07/2020  Medication Sig   predniSONE (DELTASONE) 20 MG tablet 2 tabs po daily for 5 days, then 1 tab po daily for 5 days   ALPRAZolam (XANAX) 1 MG tablet TAKE 1 TABLET BY MOUTH THREE TIMES A DAY AS NEEDED   amitriptyline (ELAVIL) 25 MG tablet Take 1 tablet (25 mg total) by mouth at bedtime.   Carboxymethylcellul-Glycerin (REFRESH OPTIVE OP) Apply to eye. Gel drops lubricant eye gel   cyclobenzaprine (FLEXERIL) 5 MG tablet Take 1 tablet (5 mg total) by mouth at bedtime as needed for muscle spasms.   HYDROcodone-acetaminophen (NORCO/VICODIN) 5-325 MG tablet Take 1 tablet by mouth 3 (three) times daily as needed. Dx Code M79.7   ketoconazole (NIZORAL) 2 % cream Apply 1 application topically 2 (two) times daily as needed for irritation.   omeprazole (PRILOSEC) 20 MG capsule TAKE 1 CAPSULE BY MOUTH TWICE A DAY   promethazine (PHENERGAN) 12.5 MG tablet TAKE 1-2 TABLETS (12.5-25 MG TOTAL) BY MOUTH 3 (THREE) TIMES DAILY AS NEEDED FOR NAUSEA OR VOMITING.   SUMAtriptan (IMITREX) 50 MG tablet TAKE 0.5-1 TABLET BY MOUTH DAILY AS NEEDED FOR MIGRAINE. MAX PER INS.   triamcinolone cream (KENALOG) 0.1 % APPLY 1 APPLICATION TOPICALLY 2 (TWO) TIMES DAILY AS NEEDED.   [DISCONTINUED] cetirizine (ZYRTEC) 10 MG tablet TAKE 1 TABLET BY MOUTH EVERY DAY (Patient not taking: No sig reported)   [DISCONTINUED] magic mouthwash w/lidocaine SOLN Take 5 mLs by mouth 4 (four) times daily as needed for mouth pain. (Patient not taking:  Reported on 07/15/2020)   No facility-administered encounter medications on file as of 10/07/2020.

## 2020-10-07 NOTE — Telephone Encounter (Signed)
[place] Night - Client Nonclinical Telephone Record AccessNurse Client [place] Night - Client Client Site [place] Physician Viviana Simpler- MD Contact Type Call Who Is Calling Patient / Member / Family / Caregiver Caller Name Falicia Imrie Caller Phone Number [phone] Patient Name Lindsey Ward Patient DOB 1950/03/16 Call Type Message Only Information Provided Reason for Call Request to Schedule Office Appointment Initial Comment Caller states needs an appoint to get her hip checked out. Patient request to speak to RN No Additional Comment Provided office hours. Disp. Time Disposition Final User 10/07/2020 7:44:41 AM General Information Provided Yes Atha Starks Michelene Gardener Call Closed By: Baker Janus Transaction Date/Time: 10/07/2020 7:41:38 AM (ET)

## 2020-10-27 ENCOUNTER — Telehealth: Payer: Self-pay | Admitting: *Deleted

## 2020-10-27 DIAGNOSIS — H02055 Trichiasis without entropian left lower eyelid: Secondary | ICD-10-CM | POA: Diagnosis not present

## 2020-10-27 DIAGNOSIS — H02052 Trichiasis without entropian right lower eyelid: Secondary | ICD-10-CM | POA: Diagnosis not present

## 2020-10-27 DIAGNOSIS — H43811 Vitreous degeneration, right eye: Secondary | ICD-10-CM | POA: Diagnosis not present

## 2020-10-27 DIAGNOSIS — H43391 Other vitreous opacities, right eye: Secondary | ICD-10-CM | POA: Diagnosis not present

## 2020-10-27 DIAGNOSIS — H04123 Dry eye syndrome of bilateral lacrimal glands: Secondary | ICD-10-CM | POA: Diagnosis not present

## 2020-10-27 DIAGNOSIS — H35373 Puckering of macula, bilateral: Secondary | ICD-10-CM | POA: Diagnosis not present

## 2020-10-27 NOTE — Telephone Encounter (Signed)
Patient called stating that she saw an eye doctor today and was told to have her PCP check her thyroid level. Patient stated that she went to [facility] today Dr. Sherlynn Stalls. Patient stated that she will call their office and have them fax their office notes to Dr. Silvio Pate for his review.Patient stated that her eyes have been hurting lately and her eye lids are swelling and getting bumps on them.

## 2020-10-28 NOTE — Telephone Encounter (Signed)
Spoke to pt. Made appt 8-29 at 245.

## 2020-11-03 ENCOUNTER — Other Ambulatory Visit: Payer: Self-pay | Admitting: Internal Medicine

## 2020-11-03 DIAGNOSIS — M5030 Other cervical disc degeneration, unspecified cervical region: Secondary | ICD-10-CM | POA: Diagnosis not present

## 2020-11-03 DIAGNOSIS — M797 Fibromyalgia: Secondary | ICD-10-CM | POA: Diagnosis not present

## 2020-11-04 NOTE — Telephone Encounter (Signed)
Last filled 08-31-20 #90 Last OV 10-07-20 Next OV 11-09-20 [facility]

## 2020-11-09 ENCOUNTER — Other Ambulatory Visit: Payer: Self-pay

## 2020-11-09 ENCOUNTER — Ambulatory Visit (INDEPENDENT_AMBULATORY_CARE_PROVIDER_SITE_OTHER): Payer: Medicare Other | Admitting: Internal Medicine

## 2020-11-09 ENCOUNTER — Encounter: Payer: Self-pay | Admitting: Internal Medicine

## 2020-11-09 VITALS — BP 130/84 | HR 65 | Temp 97.0°F | Ht 62.0 in | Wt 124.0 lb

## 2020-11-09 DIAGNOSIS — H5789 Other specified disorders of eye and adnexa: Secondary | ICD-10-CM | POA: Insufficient documentation

## 2020-11-09 DIAGNOSIS — M353 Polymyalgia rheumatica: Secondary | ICD-10-CM | POA: Insufficient documentation

## 2020-11-09 DIAGNOSIS — Z23 Encounter for immunization: Secondary | ICD-10-CM

## 2020-11-09 NOTE — Progress Notes (Signed)
Subjective:    Patient ID: Lindsey Ward, female    DOB: [birth year], 70 y.o.   MRN: [id number]  HPI Here to recheck on her eyes This visit occurred during the SARS-CoV-2 public health emergency.  Safety protocols were in place, including screening questions prior to the visit, additional usage of staff PPE, and extensive cleaning of exam room while observing appropriate contact time as indicated for disinfecting solutions.   Minor issue at her eye doctor appt He saw something that made him concerned about thyroid disease Some swelling of eyelids--and "little bumps around them" Some pain with bright lights  Recent visit with Dr Patel--rheum Severe pain from neck down---all the body Did get burst of prednisone from him---and her pain is considerably better  No problems with vision Did have normal TSH last week  Current Outpatient Medications on File Prior to Visit  Medication Sig Dispense Refill   ALPRAZolam (XANAX) 1 MG tablet TAKE 1 TABLET BY MOUTH THREE TIMES A DAY AS NEEDED 90 tablet 0   amitriptyline (ELAVIL) 25 MG tablet Take 1 tablet (25 mg total) by mouth at bedtime. 30 tablet 3   Carboxymethylcellul-Glycerin (REFRESH OPTIVE OP) Apply to eye. Gel drops lubricant eye gel     cyclobenzaprine (FLEXERIL) 5 MG tablet Take 1 tablet (5 mg total) by mouth at bedtime as needed for muscle spasms. 30 tablet 2   HYDROcodone-acetaminophen (NORCO/VICODIN) 5-325 MG tablet Take 1 tablet by mouth 3 (three) times daily as needed. Dx Code M79.7 90 tablet 0   ketoconazole (NIZORAL) 2 % cream Apply 1 application topically 2 (two) times daily as needed for irritation. 60 g 1   omeprazole (PRILOSEC) 20 MG capsule TAKE 1 CAPSULE BY MOUTH TWICE A DAY 180 capsule 3   predniSONE (DELTASONE) 20 MG tablet 2 tabs po daily for 5 days, then 1 tab po daily for 5 days 15 tablet 0   promethazine (PHENERGAN) 12.5 MG tablet TAKE 1-2 TABLETS (12.5-25 MG TOTAL) BY MOUTH 3 (THREE) TIMES DAILY AS NEEDED FOR NAUSEA OR  VOMITING. 60 tablet 0   SUMAtriptan (IMITREX) 50 MG tablet TAKE 0.5-1 TABLET BY MOUTH DAILY AS NEEDED FOR MIGRAINE. MAX PER INS. 9 tablet 11   triamcinolone cream (KENALOG) 0.1 % APPLY 1 APPLICATION TOPICALLY 2 (TWO) TIMES DAILY AS NEEDED. 45 g 1   No current facility-administered medications on file prior to visit.    Allergies  Allergen Reactions   Tizanidine Other (See Comments)    "throws me in a different world"   Aspirin     REACTION: GI bleed   Buspirone Hcl     REACTION: unspecified   Ciprofloxacin     REACTION: unspecified   Citalopram Other (See Comments)    Ear ringing.    Codeine    Diazepam    Duloxetine     REACTION: sz like activity   Ibuprofen     REACTION: GI bleed   Imipramine Hcl Other (See Comments)    Ear ringing.    Oxycodone-Acetaminophen     REACTION: unspecified   Pregabalin     REACTION: kept her up and confusion   Propoxyphene Hcl    Risperidone     REACTION: confusion   Sulfonamide Derivatives     REACTION: unspecified   Tramadol Hcl     REACTION: doesn't remember what happened but couldn't tolerate   Tramadol Hcl Other (See Comments)    REACTION: doesn't remember what happened but couldn't tolerate REACTION: doesn't remember what happened but  couldn't tolerate REACTION: doesn't remember what happened but couldn't tolerate    Venlafaxine     REACTION: unspecified   Methocarbamol Rash    Past Medical History:  Diagnosis Date   Bowel obstruction (HCC)    Chronic fatigue    Depression    Fibromyalgia    GERD (gastroesophageal reflux disease)    Hyperlipemia    Migraine    Obstipation    Osteopenia    PUD (peptic ulcer disease)     Past Surgical History:  Procedure Laterality Date   ABDOMINAL SURGERY     APPENDECTOMY  2005   BREAST BIOPSY Bilateral 1999   rt w/out clip - neg, lt w/clip - neg   BREAST CYST ASPIRATION Right 2003   neg   CHOLECYSTECTOMY  2004   COLONOSCOPY WITH PROPOFOL N/A 06/09/2016   Procedure: COLONOSCOPY  WITH PROPOFOL;  Surgeon: Jonathon Bellows, MD;  Location: [facility] END[facility]COPY;  Service: Endoscopy;  Laterality: N/A;   ESOPHAGOGASTRODUODENOSCOPY (EGD) WITH PROPOFOL N/A 06/09/2016   Procedure: ESOPHAGOGASTRODUODENOSCOPY (EGD) WITH PROPOFOL;  Surgeon: Jonathon Bellows, MD;  Location: [facility] ENDOSCOPY;  Service: Endoscopy;  Laterality: N/A;   EYE SURGERY     GIVENS CAPSULE STUDY N/A 06/23/2016   Procedure: GIVENS CAPSULE STUDY;  Surgeon: Jonathon Bellows, MD;  Location: [facility] ENDOSCOPY;  Service: Endoscopy;  Laterality: N/A;   PARS PLANA VITRECTOMY W/ REPAIR OF MACULAR HOLE Left 01/2017   [facility]   TOTAL ABDOMINAL HYSTERECTOMY W/ BILATERAL SALPINGOOPHORECTOMY  1986    Family History  Problem Relation Age of Onset   Heart disease Mother        AMI   Stroke Mother    Heart disease Father    Hypertension Sister    Breast cancer Sister    Hypertension Sister    Heart disease Sister        MI   Diabetes Sister    Kidney cancer Neg Hx    Kidney disease Neg Hx    Prostate cancer Neg Hx     Social History   Socioeconomic History   Marital status: Single    Spouse name: Not on file   Number of children: 1   Years of education: Not on file   Highest education level: Not on file  Occupational History   Occupation: DISABLED  Tobacco Use   Smoking status: Former    Packs/day: 1.00    Years: 40.00    Pack years: 40.00    Types: Cigarettes    Quit date: 09/25/2013    Years since quitting: 7.1   Smokeless tobacco: Never   Tobacco comments:       Vaping Use   Vaping Use: Never used  Substance and Sexual Activity   Alcohol use: Not Currently    Alcohol/week: 0.0 standard drinks    Comment: rarely   Drug use: No   Sexual activity: Not on file  Other Topics Concern   Not on file  Social History Narrative   Living alone again      No living will   Daughter should make health care decisions   Requests DNR---done 10/24/14   No tube feeds if cognitively unaware   Highest level of education: 9th   Social  Determinants of Health   Financial Resource Strain: Not on file  Food Insecurity: Not on file  Transportation Needs: Not on file  Physical Activity: Not on file  Stress: Not on file  Social Connections: Not on file  Intimate Partner Violence: Not on file  Review of Systems Weight up a couple of pounds  Sleep is still not great    Objective:   Physical Exam Constitutional:      Appearance: Normal appearance.  Eyes:     Conjunctiva/sclera: Conjunctivae normal.     Pupils: Pupils are equal, round, and reactive to light.     Comments: Some bagginess below lower lids  Neurological:     Mental Status: She is alert.  Psychiatric:        Mood and Affect: Mood normal.        Behavior: Behavior normal.           Assessment & Plan:

## 2020-11-09 NOTE — Assessment & Plan Note (Signed)
Has a different type of muscle pain----has responded somewhat to prednisone ESR elevated at 49 Not sure what Dr Posey Pronto plans--but if she weans off and the pain gets worse again, should probably treat her as PMR with persistent lower dose prednisone

## 2020-11-09 NOTE — Assessment & Plan Note (Signed)
Looks like in lower lids---like allergic related She has tried an allergy pill today Reassured---thyroid test normal

## 2020-11-09 NOTE — Addendum Note (Signed)
Addended by: Pilar Grammes on: 11/09/2020 04:26 PM   Modules accepted: Orders

## 2020-11-18 DIAGNOSIS — M797 Fibromyalgia: Secondary | ICD-10-CM | POA: Diagnosis not present

## 2020-11-18 DIAGNOSIS — M5030 Other cervical disc degeneration, unspecified cervical region: Secondary | ICD-10-CM | POA: Diagnosis not present

## 2020-11-18 DIAGNOSIS — R700 Elevated erythrocyte sedimentation rate: Secondary | ICD-10-CM | POA: Diagnosis not present

## 2020-11-24 ENCOUNTER — Telehealth: Payer: Self-pay | Admitting: Internal Medicine

## 2020-11-24 ENCOUNTER — Other Ambulatory Visit: Payer: Self-pay | Admitting: Internal Medicine

## 2020-11-24 NOTE — Telephone Encounter (Signed)
Pt called and wanted to let Dr. Silvio Pate know that a medicare nurse came out to her house and they ended up doing a Hep C test. She states she received the results and it was negative. She wanted to make sure Dr. Silvio Pate was aware.

## 2020-11-24 NOTE — Telephone Encounter (Signed)
  Encourage patient to contact the pharmacy for refills or they can request refills through [place]:  Please schedule appointment if longer than 1 year  NEXT APPOINTMENT DATE:  MEDICATION: Hydrocodone  Is the patient out of medication? No  PHARMACY: [facility]  Let patient know to contact pharmacy at the end of the day to make sure medication is ready.  Please notify patient to allow 48-72 hours to process  CLINICAL FILLS OUT ALL BELOW:   LAST REFILL:  QTY:  REFILL DATE:    OTHER COMMENTS:    Okay for refill?  Please advise

## 2020-11-24 NOTE — Telephone Encounter (Signed)
Name of Medication: Hydrocodone Name of Pharmacy: [facility] or Written Date and Quantity: 09-24-20 #90 Last Office Visit and Type: 11-09-20 Next Office Visit and Type: No Future OV Last Controlled Substance Agreement Date: 06-12-19 Last UDS: 04-16-20

## 2020-11-24 NOTE — Telephone Encounter (Signed)
Left message for pt on vm asking if she could get a copy of the results to [place] at her convenience.

## 2020-11-24 NOTE — Telephone Encounter (Signed)
Patient returned call .stated will drop off copy of result .

## 2020-11-25 ENCOUNTER — Telehealth: Payer: Self-pay | Admitting: Internal Medicine

## 2020-11-25 MED ORDER — HYDROCODONE-ACETAMINOPHEN 5-325 MG PO TABS: 1.0000 | ORAL_TABLET | Freq: Three times a day (TID) | ORAL | 0 refills | Status: DC | PRN

## 2020-11-25 NOTE — Telephone Encounter (Signed)
Pt came into office to drop off hepatitis test results. Place in provider box

## 2020-11-26 ENCOUNTER — Other Ambulatory Visit: Payer: Self-pay

## 2020-11-26 LAB — HM HEPATITIS C SCREENING LAB: HM Hepatitis Screen: NEGATIVE

## 2020-11-26 NOTE — Telephone Encounter (Signed)
Abstracted results

## 2020-11-27 DIAGNOSIS — H35373 Puckering of macula, bilateral: Secondary | ICD-10-CM | POA: Diagnosis not present

## 2020-11-27 DIAGNOSIS — H43391 Other vitreous opacities, right eye: Secondary | ICD-10-CM | POA: Diagnosis not present

## 2020-11-27 DIAGNOSIS — H43811 Vitreous degeneration, right eye: Secondary | ICD-10-CM | POA: Diagnosis not present

## 2020-11-30 ENCOUNTER — Telehealth: Payer: Self-pay | Admitting: Internal Medicine

## 2020-11-30 NOTE — Telephone Encounter (Signed)
Spoke to pt. She has not decreased to '15mg'$  yet. Says 20 mg does not work. She will get with Dr Posey Pronto.

## 2020-11-30 NOTE — Telephone Encounter (Signed)
Pt called stated the medication predniSONE (DELTASONE) 20 MG tablet is not working Would like a call back  #[phone] . Please Advise

## 2020-12-14 DIAGNOSIS — M48062 Spinal stenosis, lumbar region with neurogenic claudication: Secondary | ICD-10-CM | POA: Diagnosis not present

## 2020-12-14 DIAGNOSIS — M5136 Other intervertebral disc degeneration, lumbar region: Secondary | ICD-10-CM | POA: Diagnosis not present

## 2020-12-14 DIAGNOSIS — M5416 Radiculopathy, lumbar region: Secondary | ICD-10-CM | POA: Diagnosis not present

## 2020-12-18 ENCOUNTER — Other Ambulatory Visit: Payer: Self-pay

## 2020-12-18 ENCOUNTER — Encounter: Payer: Self-pay | Admitting: Emergency Medicine

## 2020-12-18 ENCOUNTER — Emergency Department
Admission: EM | Admit: 2020-12-18 | Discharge: 2020-12-19 | Disposition: A | Discharge status: Home / Self Care | Payer: Medicare Other | Attending: Emergency Medicine | Admitting: Emergency Medicine

## 2020-12-18 ENCOUNTER — Emergency Department: Payer: Medicare Other

## 2020-12-18 DIAGNOSIS — R197 Diarrhea, unspecified: Secondary | ICD-10-CM | POA: Insufficient documentation

## 2020-12-18 DIAGNOSIS — Z87891 Personal history of nicotine dependence: Secondary | ICD-10-CM | POA: Insufficient documentation

## 2020-12-18 DIAGNOSIS — R109 Unspecified abdominal pain: Secondary | ICD-10-CM | POA: Diagnosis not present

## 2020-12-18 DIAGNOSIS — R1030 Lower abdominal pain, unspecified: Secondary | ICD-10-CM

## 2020-12-18 DIAGNOSIS — K922 Gastrointestinal hemorrhage, unspecified: Secondary | ICD-10-CM | POA: Diagnosis not present

## 2020-12-18 DIAGNOSIS — R195 Other fecal abnormalities: Secondary | ICD-10-CM

## 2020-12-18 DIAGNOSIS — I10 Essential (primary) hypertension: Secondary | ICD-10-CM | POA: Diagnosis not present

## 2020-12-18 DIAGNOSIS — R1084 Generalized abdominal pain: Secondary | ICD-10-CM | POA: Diagnosis present

## 2020-12-18 LAB — COMPREHENSIVE METABOLIC PANEL
ALT: 28 U/L (ref 0–44)
AST: 27 U/L (ref 15–41)
Albumin: 3.7 g/dL (ref 3.5–5.0)
Alkaline Phosphatase: 59 U/L (ref 38–126)
Anion gap: 12 (ref 5–15)
BUN: 18 mg/dL (ref 8–23)
CO2: 30 mmol/L (ref 22–32)
Calcium: 9.5 mg/dL (ref 8.9–10.3)
Chloride: 95 mmol/L — ABNORMAL LOW (ref 98–111)
Creatinine, Ser: 1.44 mg/dL — ABNORMAL HIGH (ref 0.44–1.00)
GFR, Estimated: 39 mL/min — ABNORMAL LOW (ref >60)
Glucose, Bld: 145 mg/dL — ABNORMAL HIGH (ref 70–99)
Potassium: 4.1 mmol/L (ref 3.5–5.1)
Sodium: 137 mmol/L (ref 135–145)
Total Bilirubin: 0.5 mg/dL (ref 0.3–1.2)
Total Protein: 6.6 g/dL (ref 6.5–8.1)

## 2020-12-18 LAB — URINALYSIS, COMPLETE (UACMP) WITH MICROSCOPIC
Bilirubin Urine: NEGATIVE
Glucose, UA: NEGATIVE mg/dL
Hgb urine dipstick: NEGATIVE
Ketones, ur: NEGATIVE mg/dL
Leukocytes,Ua: NEGATIVE
Nitrite: NEGATIVE
Protein, ur: NEGATIVE mg/dL
Specific Gravity, Urine: 1.008 (ref 1.005–1.030)
WBC, UA: NONE SEEN WBC/hpf (ref 0–5)
pH: 8 (ref 5.0–8.0)

## 2020-12-18 LAB — CBC
HCT: 35.3 % — ABNORMAL LOW (ref 36.0–46.0)
Hemoglobin: 12.2 g/dL (ref 12.0–15.0)
MCH: 33.8 pg (ref 26.0–34.0)
MCHC: 34.6 g/dL (ref 30.0–36.0)
MCV: 97.8 fL (ref 80.0–100.0)
Platelets: 317 10*3/uL (ref 150–400)
RBC: 3.61 MIL/uL — ABNORMAL LOW (ref 3.87–5.11)
RDW: 12.7 % (ref 11.5–15.5)
WBC: 13.1 10*3/uL — ABNORMAL HIGH (ref 4.0–10.5)
nRBC: 0 % (ref 0.0–0.2)

## 2020-12-18 LAB — LIPASE, BLOOD: Lipase: 31 U/L (ref 11–51)

## 2020-12-18 MED ORDER — FAMOTIDINE 20 MG PO TABS: 20.0000 mg | ORAL_TABLET | Freq: Two times a day (BID) | ORAL | 0 refills | Status: DC

## 2020-12-18 MED ORDER — IOHEXOL 350 MG/ML SOLN
50.0000 mL | Freq: Once | INTRAVENOUS | Status: AC | PRN
  Administered 2020-12-18: 50 mL via INTRAVENOUS

## 2020-12-18 MED ORDER — ONDANSETRON HCL 4 MG/2ML IJ SOLN
4.0000 mg | Freq: Once | INTRAMUSCULAR | Status: AC
Start: 2020-12-18 — End: 2020-12-18
  Administered 2020-12-18: 4 mg via INTRAVENOUS
  Filled 2020-12-18 (×2): qty 2

## 2020-12-18 MED ORDER — HYDROCODONE-ACETAMINOPHEN 5-325 MG PO TABS
1.0000 | ORAL_TABLET | Freq: Once | ORAL | Status: AC
Start: 2020-12-18 — End: 2020-12-18
  Administered 2020-12-18: 1 via ORAL
  Filled 2020-12-18: qty 1

## 2020-12-18 MED ORDER — HYDROMORPHONE HCL 1 MG/ML IJ SOLN
1.0000 mg | Freq: Once | INTRAMUSCULAR | Status: DC
Start: 2020-12-18 — End: 2020-12-18
  Filled 2020-12-18: qty 1

## 2020-12-18 MED ORDER — METOCLOPRAMIDE HCL 10 MG PO TABS
10.0000 mg | ORAL_TABLET | Freq: Four times a day (QID) | ORAL | 0 refills | Status: DC | PRN
Start: 2020-12-18 — End: 2021-03-30

## 2020-12-18 MED ORDER — LACTATED RINGERS IV BOLUS
1000.0000 mL | Freq: Once | INTRAVENOUS | Status: AC
  Administered 2020-12-18: 1000 mL via INTRAVENOUS

## 2020-12-18 NOTE — ED Provider Notes (Signed)
[facility] Emergency Department Provider Note  ____________________________________________  Time seen: Approximately 11:11 PM  I have reviewed the triage vital signs and the nursing notes.   HISTORY  Chief Complaint Abdominal Pain and Diarrhea    HPI Lindsey Ward is a 70 y.o. female with a past history of bowel obstruction, fibromyalgia, peptic ulcer disease who comes ED complaining of generalized abdominal pain for last 3 days, waxing and waning, constant, nonradiating, no aggravating or alleviating factors, associated with loose bowel movements which appeared dark.  She reports that she had previously been on Prilosec but she stopped taking it because she had felt better for a long time.    Past Medical History:  Diagnosis Date   Bowel obstruction (HCC)    Chronic fatigue    Depression    Fibromyalgia    GERD (gastroesophageal reflux disease)    Hyperlipemia    Migraine    Obstipation    Osteopenia    PUD (peptic ulcer disease)      Patient Active Problem List   Diagnosis Date Noted   Eye swelling 11/09/2020   Polymyalgia rheumatica syndrome ([place]) 11/09/2020   Temporal pain 07/15/2020   Quadriceps weakness 01/17/2020   Joint pain 01/02/2020   Pain of left thumb 03/25/2019   Ringworm 03/25/2019   Aortic atherosclerosis ([place]) 04/23/2018   Coronary artery calcification seen on CT scan 04/23/2018   Narcotic dependence ([place]) 03/22/2018   Benzodiazepine dependence ([place]) 03/22/2018   Macular hole, left eye 01/19/2017   Iron deficiency anemia 06/05/2016   Constipation 05/17/2016   LPRD (laryngopharyngeal reflux disease) 09/21/2015   Chronic low back pain with right-sided sciatica 06/17/2015   Advance directive discussed with patient 10/24/2014   Routine general medical examination at a health care facility 07/14/2010   OSTEOPENIA 03/10/2009   Mood disorder ([place]) 08/15/2006   Gastroesophageal reflux disease 08/15/2006   Fibromyalgia  08/15/2006   Sleep disturbance 08/15/2006   Hyperlipidemia 12/20/2005   PANIC ATTACKS 12/20/2005   Atypical migraine 12/20/2005     Past Surgical History:  Procedure Laterality Date   ABDOMINAL SURGERY     APPENDECTOMY  2005   BREAST BIOPSY Bilateral 1999   rt w/out clip - neg, lt w/clip - neg   BREAST CYST ASPIRATION Right 2003   neg   CHOLECYSTECTOMY  2004   COLONOSCOPY WITH PROPOFOL N/A 06/09/2016   Procedure: COLONOSCOPY WITH PROPOFOL;  Surgeon: Jonathon Bellows, MD;  Location: [facility];  Service: Endoscopy;  Laterality: N/A;   ESOPHAGOGASTRODUODENOSCOPY (EGD) WITH PROPOFOL N/A 06/09/2016   Procedure: ESOPHAGOGASTRODUODENOSCOPY (EGD) WITH PROPOFOL;  Surgeon: Jonathon Bellows, MD;  Location: [facility] ENDOSCOPY;  Service: Endoscopy;  Laterality: N/A;   EYE SURGERY     GIVENS CAPSULE STUDY N/A 06/23/2016   Procedure: GIVENS CAPSULE STUDY;  Surgeon: Jonathon Bellows, MD;  Location: [facility] ENDOSCOPY;  Service: Endoscopy;  Laterality: N/A;   PARS PLANA VITRECTOMY W/ REPAIR OF MACULAR HOLE Left 01/2017   [facility]   TOTAL ABDOMINAL HYSTERECTOMY W/ BILATERAL SALPINGOOPHORECTOMY  1986     Prior to Admission medications   Medication Sig Start Date End Date Taking? Authorizing Provider  famotidine (PEPCID) 20 MG tablet Take 1 tablet (20 mg total) by mouth 2 (two) times daily. 12/18/20  Yes Carrie Mew, MD  metoCLOPramide (REGLAN) 10 MG tablet Take 1 tablet (10 mg total) by mouth every 6 (six) hours as needed. 12/18/20  Yes Carrie Mew, MD  ALPRAZolam Duanne Moron) 1 MG tablet TAKE 1 TABLET BY MOUTH THREE TIMES A  DAY AS NEEDED 11/04/20   Venia Carbon, MD  amitriptyline (ELAVIL) 25 MG tablet Take 1 tablet (25 mg total) by mouth at bedtime. 07/15/20   Venia Carbon, MD  Carboxymethylcellul-Glycerin (REFRESH OPTIVE OP) Apply to eye. Gel drops lubricant eye gel    [provider]  cyclobenzaprine (FLEXERIL) 5 MG tablet Take 1 tablet (5 mg total) by mouth at bedtime as needed for muscle spasms.  08/26/20   Venia Carbon, MD  HYDROcodone-acetaminophen (NORCO/VICODIN) 5-325 MG tablet Take 1 tablet by mouth 3 (three) times daily as needed. Dx Code M79.7 11/25/20   Venia Carbon, MD  ketoconazole (NIZORAL) 2 % cream Apply 1 application topically 2 (two) times daily as needed for irritation. 03/25/19   Venia Carbon, MD  omeprazole (PRILOSEC) 20 MG capsule TAKE 1 CAPSULE BY MOUTH TWICE A DAY 06/05/20   Venia Carbon, MD  predniSONE (DELTASONE) 20 MG tablet 2 tabs po daily for 5 days, then 1 tab po daily for 5 days 10/07/20   Copland, Frederico Hamman, MD  promethazine (PHENERGAN) 12.5 MG tablet TAKE 1-2 TABLETS (12.5-25 MG TOTAL) BY MOUTH 3 (THREE) TIMES DAILY AS NEEDED FOR NAUSEA OR VOMITING. 08/21/20   Venia Carbon, MD  SUMAtriptan (IMITREX) 50 MG tablet TAKE 0.5-1 TABLET BY MOUTH DAILY AS NEEDED FOR MIGRAINE. MAX PER INS. 05/07/20   Viviana Simpler I, MD  triamcinolone cream (KENALOG) 0.1 % APPLY 1 APPLICATION TOPICALLY 2 (TWO) TIMES DAILY AS NEEDED. 08/05/19   Venia Carbon, MD     Allergies Tizanidine, Aspirin, Buspirone hcl, Ciprofloxacin, Citalopram, Codeine, Diazepam, Duloxetine, Ibuprofen, Imipramine hcl, Oxycodone-acetaminophen, Pregabalin, Propoxyphene hcl, Risperidone, Sulfonamide derivatives, Tramadol hcl, Tramadol hcl, Venlafaxine, and Methocarbamol   Family History  Problem Relation Age of Onset   Heart disease Mother        AMI   Stroke Mother    Heart disease Father    Hypertension Sister    Breast cancer Sister    Hypertension Sister    Heart disease Sister        MI   Diabetes Sister    Kidney cancer Neg Hx    Kidney disease Neg Hx    Prostate cancer Neg Hx     Social History Social History   Tobacco Use   Smoking status: Former    Packs/day: 1.00    Years: 40.00    Pack years: 40.00    Types: Cigarettes    Quit date: 09/25/2013    Years since quitting: 7.2   Smokeless tobacco: Never   Tobacco comments:       Vaping Use   Vaping Use: Never  used  Substance Use Topics   Alcohol use: Not Currently    Alcohol/week: 0.0 standard drinks    Comment: rarely   Drug use: No    Review of Systems  Constitutional:   No fever or chills.  ENT:   No sore throat. No rhinorrhea. Cardiovascular:   No chest pain or syncope. Respiratory:   No dyspnea or cough. Gastrointestinal:   Positive as above for abdominal pain and diarrhea Musculoskeletal:   Negative for focal pain or swelling All other systems reviewed and are negative except as documented above in ROS and HPI.  ____________________________________________   PHYSICAL EXAM:  VITAL SIGNS: ED Triage Vitals  Enc Vitals Group     BP 12/18/20 2107 (!) 158/63     Pulse Rate 12/18/20 2107 60     Resp 12/18/20 2107 17  Temp 12/18/20 2107 98.1 F (36.7 C)     Temp Source 12/18/20 2107 Oral     SpO2 12/18/20 2107 97 %     Weight 12/18/20 1800 129 lb (58.5 kg)     Height 12/18/20 1800 5\' 4"  (1.626 m)     Head Circumference --      Peak Flow --      Pain Score 12/18/20 1800 10     Pain Loc --      Pain Edu? --      Excl. in [place]? --     Vital signs reviewed, nursing assessments reviewed.   Constitutional:   Alert and oriented. Non-toxic appearance. Eyes:   Conjunctivae are normal. EOMI. PERRL. ENT      Head:   Normocephalic and atraumatic.      Nose:   Wearing a mask.      Mouth/Throat:   Wearing a mask.      Neck:   No meningismus. Full ROM. Hematological/Lymphatic/Immunilogical:   No cervical lymphadenopathy. Cardiovascular:   RRR. Symmetric bilateral radial and DP pulses.  No murmurs. Cap refill less than 2 seconds. Respiratory:   Normal respiratory effort without tachypnea/retractions. Breath sounds are clear and equal bilaterally. No wheezes/rales/rhonchi. Gastrointestinal:   Soft with diffuse lower abdominal tenderness. Non distended. There is no CVA tenderness.  No rebound, rigidity, or guarding.  Rectal exam performed at bedside with nurse present, no hemorrhoids,  brown stool, Hemoccult positive Genitourinary:   deferred Musculoskeletal:   Normal range of motion in all extremities. No joint effusions.  No lower extremity tenderness.  No edema. Neurologic:   Normal speech and language.  Motor grossly intact. No acute focal neurologic deficits are appreciated.  Skin:    Skin is warm, dry and intact. No rash noted.  No petechiae, purpura, or bullae.  ____________________________________________    LABS (pertinent positives/negatives) (all labs ordered are listed, but only abnormal results are displayed) Labs Reviewed  COMPREHENSIVE METABOLIC PANEL - Abnormal; Notable for the following components:      Result Value   Chloride 95 (*)    Glucose, Bld 145 (*)    Creatinine, Ser 1.44 (*)    GFR, Estimated 39 (*)    All other components within normal limits  CBC - Abnormal; Notable for the following components:   WBC 13.1 (*)    RBC 3.61 (*)    HCT 35.3 (*)    All other components within normal limits  URINALYSIS, COMPLETE (UACMP) WITH MICROSCOPIC - Abnormal; Notable for the following components:   Color, Urine YELLOW (*)    APPearance CLOUDY (*)    Bacteria, UA RARE (*)    All other components within normal limits  LIPASE, BLOOD   ____________________________________________   EKG  Interpreted by me Normal sinus rhythm rate of 63.  Normal axis and intervals.  Normal QRS ST segments and T waves.  ____________________________________________    RADIOLOGY  CT ABDOMEN PELVIS W CONTRAST  Result Date: 12/18/2020 CLINICAL DATA:  Diverticulitis suspected. Abdominal pain, acute, nonlocalized. EXAM: CT ABDOMEN AND PELVIS WITH CONTRAST TECHNIQUE: Multidetector CT imaging of the abdomen and pelvis was performed using the standard protocol following bolus administration of intravenous contrast. CONTRAST:  80mL OMNIPAQUE IOHEXOL 350 MG/ML SOLN COMPARISON:  None. FINDINGS: Lower chest: Mild dependent changes in the lung bases. Hepatobiliary: No focal  liver abnormality is seen. Status post cholecystectomy. No biliary dilatation. Pancreas: Unremarkable. No pancreatic ductal dilatation or surrounding inflammatory changes. Spleen: Normal in size without focal abnormality.  Adrenals/Urinary Tract: Adrenal glands are unremarkable. Kidneys are normal, without renal calculi, focal lesion, or hydronephrosis. Bladder is unremarkable. Stomach/Bowel: Stomach, small bowel, and colon are not abnormally distended. Stool fills the colon. No wall thickening or inflammatory changes are appreciated. Diverticulosis of the sigmoid colon without evidence of diverticulitis. Surgical absence of the appendix. Vascular/Lymphatic: Diffuse calcification of the aorta. No aneurysm or dissection. Moderately prominent lymph nodes in the right pelvis and right internal and external iliac chain. Mild stranding in the fat in this area. This may represent adenitis or mesenteric inflammatory process. No loculated collection. No pathologic lymphadenopathy is suggested. Reproductive: Uterus is surgically absent. No abnormal pelvic masses. Other: No free air or free fluid in the abdomen. Minimal right inguinal hernia containing fat. Musculoskeletal: Degenerative changes in the spine. No destructive bone lesions. IMPRESSION: 1. Diverticulosis of the sigmoid colon without evidence of diverticulitis. 2. Mild prominence of lymph nodes in the right pelvis and external iliac region with some stranding. This may represent inflammatory process such as adenitis. No loculated collections. 3. Aortic atherosclerosis. Electronically Signed   By: Lucienne Capers M.D.   On: 12/18/2020 22:53    ____________________________________________   PROCEDURES Procedures  ____________________________________________  DIFFERENTIAL DIAGNOSIS   Diverticulitis, colitis, gastritis, peptic ulcer disease, intra-abdominal abscess  CLINICAL IMPRESSION / ASSESSMENT AND PLAN / ED COURSE  Medications ordered in the  ED: Medications  ondansetron (ZOFRAN) injection 4 mg (has no administration in time range)  HYDROcodone-acetaminophen (NORCO/VICODIN) 5-325 MG per tablet 1 tablet (has no administration in time range)  lactated ringers bolus 1,000 mL (1,000 mLs Intravenous New Bag/Given 12/18/20 2250)  iohexol (OMNIPAQUE) 350 MG/ML injection 50 mL (50 mLs Intravenous Contrast Given 12/18/20 2238)    Pertinent labs & imaging results that were available during my care of the patient were reviewed by me and considered in my medical decision making (see chart for details).  CULLEN VANALLEN was evaluated in Emergency Department on 12/18/2020 for the symptoms described in the history of present illness. She was evaluated in the context of the global COVID-19 pandemic, which necessitated consideration that the patient might be at risk for infection with the SARS-CoV-2 virus that causes COVID-19. Institutional protocols and algorithms that pertain to the evaluation of patients at risk for COVID-19 are in a state of rapid change based on information released by regulatory bodies including the CDC and federal and state organizations. These policies and algorithms were followed during the patient's care in the ED.   Patient presents with abdominal pain with some lower abdominal tenderness.  Also has some occult GI bleeding.  Vital signs are normal, she is, nontoxic.  Labs are normal.  CT scan unremarkable.  By history, I suspect that this is an occult gastric bleed, possibly from peptic ulcer disease.  No brisk hemorrhage, she stable for outpatient follow-up.  Recommended she restart antacid therapy with Prilosec or Pepcid as well as Reglan and follow-up with gastroenterology.      ____________________________________________   FINAL CLINICAL IMPRESSION(S) / ED DIAGNOSES    Final diagnoses:  Lower abdominal pain  Occult GI bleeding     ED Discharge Orders          Ordered    metoCLOPramide (REGLAN) 10 MG tablet   Every 6 hours PRN        12/18/20 2311    famotidine (PEPCID) 20 MG tablet  2 times daily        12/18/20 2311  Portions of this note were generated with dragon dictation software. Dictation errors may occur despite best attempts at proofreading.    Carrie Mew, MD 12/18/20 2321

## 2020-12-18 NOTE — Discharge Instructions (Signed)
Your lab tests and CT scan today were all okay.  Please take Pepcid and Reglan as prescribed to improve your symptoms and follow-up with your primary care doctor and gastroenterology next week.

## 2020-12-18 NOTE — ED Triage Notes (Signed)
Pt to ED via POV c/o generalized abdominal pain for the last 2-3 days. Pt states that she has also had loose BMs. Pt denies fever. Pt is in NAD.

## 2020-12-19 NOTE — ED Notes (Signed)
Pt assisted to the bathroom by this RN. Medications adminstered as ordered. Explained will return to D/C patient. Pt states understanding, visualized in NAD at this time. Pt gives consent for this RN to contact sister to pick up for discharge.

## 2020-12-19 NOTE — ED Notes (Signed)
NAD noted at time of D/C. Pt D/C into the care of her sister/sober ride [place]. Pt denies comments/concerns regarding D/C instructions.

## 2020-12-19 NOTE — ED Notes (Signed)
This RN contacted patient's sister Tarri Glenn [phone] to notify of pending discharge. Pt's sister states understanding.

## 2020-12-23 ENCOUNTER — Encounter: Payer: Self-pay | Admitting: Family Medicine

## 2020-12-23 ENCOUNTER — Telehealth: Payer: Self-pay

## 2020-12-23 ENCOUNTER — Other Ambulatory Visit: Payer: Self-pay

## 2020-12-23 ENCOUNTER — Ambulatory Visit (INDEPENDENT_AMBULATORY_CARE_PROVIDER_SITE_OTHER): Payer: Medicare Other | Admitting: Family Medicine

## 2020-12-23 VITALS — BP 132/70 | HR 82 | Temp 97.0°F | Ht 62.0 in | Wt 127.2 lb

## 2020-12-23 DIAGNOSIS — K219 Gastro-esophageal reflux disease without esophagitis: Secondary | ICD-10-CM | POA: Diagnosis not present

## 2020-12-23 DIAGNOSIS — K5900 Constipation, unspecified: Secondary | ICD-10-CM

## 2020-12-23 MED ORDER — OMEPRAZOLE 20 MG PO CPDR: 20.0000 mg | DELAYED_RELEASE_CAPSULE | Freq: Every day | ORAL | 0 refills | Status: DC

## 2020-12-23 NOTE — Patient Instructions (Addendum)
Reflux/dark stools - take omeprazole 20 mg once daily - for 4 weeks - call if more dark stools or tarry stools    Constipation   Constipation is a common issue. Often it is related to diet and occasionally medications.   What you can do to treat your symptoms 1) Fiber -- Eat more fiber rich foods: beans, broccoli, berries, avocados, popcorn, pear/apple, green peas, turnip greens, brussels sprouts, whole grains (barley, bran, quinoa, oatmeal) -- Take a Fiber supplement: Psyllium (Metamucil)  -- Could also eat Prunes daily  2) Hydration  -- Drink more water: Try to drink 64 oz of water per day  3) Exercise -- Moderate exercise (walking, jogging, biking) for 30 minutes, 5 days a week  4) Dedicate time for Bowel movements - do not delay  5) Stool Softener  - Docusate Sodium (Colace) 100 mg daily or twice daily as needed   If 4-6 weeks have passed and the above has not helped then start the following 6) Laxatives -- Polyethylene Glycol (Miralax) - begin with once daily. After a few days can increase to twice daily Or -- Magnesium Citrate -- Common side effect is nausea and diarrhea -- can try if still not improved    Treating chronic constipation is often about finding the right amount of medication and fiber to keep you regular and comfortable. For some people that may be daily metamucil and colace every other day. For others it may be Metamucil and colace twice daily and Miralax 3 times a week. The goal is to go slow and listen to your body. And normal can be anywhere from 2-3 soft bowel movements a day to 1 bowel movement every 2-3 days.

## 2020-12-23 NOTE — Assessment & Plan Note (Signed)
Pt notes persistent symptoms. Discussed trial of prunes or colace and importance of daily regimen (recent ER visit w/ diarrhea in setting of laxative). Handout provided.

## 2020-12-23 NOTE — Telephone Encounter (Signed)
Spoke to pt. She said she is feeling better.

## 2020-12-23 NOTE — Progress Notes (Signed)
Subjective:     Lindsey Ward is a 70 y.o. female presenting for ER follow-up     HPI  #Abdominal pain and diarrhea - has been taking tums - has not been taking PPI - no diarrhea - no abdominal pain - no acid reflux - this got better w/in a few days - did have dark and tarry stools   #Constipation - does not go often - takes laxatives on occasion  - always strains for bm - has hard stools    Review of Systems  12/18/2020: ER - abdominal pain + diarrhea - CT w/ adenitis - restart PPI  Social History   Tobacco Use  Smoking Status Former   Packs/day: 1.00   Years: 40.00   Pack years: 40.00   Types: Cigarettes   Quit date: 09/25/2013   Years since quitting: 7.2  Smokeless Tobacco Never  Tobacco Comments            Objective:    BP Readings from Last 3 Encounters:  12/23/20 132/70  12/19/20 (!) 161/71  11/09/20 130/84   Wt Readings from Last 3 Encounters:  12/23/20 127 lb 4 oz (57.7 kg)  12/18/20 129 lb (58.5 kg)  11/09/20 124 lb (56.2 kg)    BP 132/70   Pulse 82   Temp (!) 97 F (36.1 C) (Temporal)   Ht 5\' 2"  (1.575 m)   Wt 127 lb 4 oz (57.7 kg)   SpO2 100%   BMI 23.27 kg/m    Physical Exam Constitutional:      General: She is not in acute distress.    Appearance: She is well-developed. She is not diaphoretic.  HENT:     Right Ear: External ear normal.     Left Ear: External ear normal.  Eyes:     Conjunctiva/sclera: Conjunctivae normal.  Cardiovascular:     Rate and Rhythm: Normal rate and regular rhythm.     Heart sounds: No murmur heard. Pulmonary:     Effort: Pulmonary effort is normal. No respiratory distress.     Breath sounds: Normal breath sounds. No wheezing.  Abdominal:     General: Abdomen is flat. Bowel sounds are normal. There is no distension.     Palpations: Abdomen is soft.     Tenderness: There is no abdominal tenderness. There is no guarding or rebound.  Musculoskeletal:     Cervical back: Neck supple.   Skin:    General: Skin is warm and dry.     Capillary Refill: Capillary refill takes less than 2 seconds.  Neurological:     Mental Status: She is alert. Mental status is at baseline.  Psychiatric:        Mood and Affect: Mood normal.        Behavior: Behavior normal.          Assessment & Plan:   Problem List Items Addressed This Visit       Digestive   Gastroesophageal reflux disease - Primary    Reviewed ER visit and black stools. Encouraged restarting PPI. Colonoscopy from 2018 w/o polyps. If black stool persists advise GI or PCP f/u      Relevant Medications   omeprazole (PRILOSEC) 20 MG capsule     Other   Constipation    Pt notes persistent symptoms. Discussed trial of prunes or colace and importance of daily regimen (recent ER visit w/ diarrhea in setting of laxative). Handout provided.         Return if  symptoms worsen or fail to improve.  Lesleigh Noe, MD  This visit occurred during the SARS-CoV-2 public health emergency.  Safety protocols were in place, including screening questions prior to the visit, additional usage of staff PPE, and extensive cleaning of exam room while observing appropriate contact time as indicated for disinfecting solutions.

## 2020-12-23 NOTE — Assessment & Plan Note (Signed)
Reviewed ER visit and black stools. Encouraged restarting PPI. Colonoscopy from 2018 w/o polyps. If black stool persists advise GI or PCP f/u

## 2020-12-28 ENCOUNTER — Other Ambulatory Visit: Payer: Self-pay | Admitting: Internal Medicine

## 2020-12-28 NOTE — Telephone Encounter (Signed)
Last filled 11-04-20 #90 Last OV 12-23-20 No Future OV [facility]

## 2021-01-01 ENCOUNTER — Encounter: Payer: Self-pay | Admitting: Internal Medicine

## 2021-02-26 ENCOUNTER — Other Ambulatory Visit: Payer: Self-pay | Admitting: Internal Medicine

## 2021-02-26 NOTE — Telephone Encounter (Signed)
Last filled 12-28-20 #90 Last OV 12-23-20 No Future OV [facility]

## 2021-03-12 ENCOUNTER — Telehealth: Payer: Self-pay | Admitting: Internal Medicine

## 2021-03-12 NOTE — Telephone Encounter (Signed)
Left message for pt to call back. She needs to be seen somewhere for an xray. We cannot just order an xray.

## 2021-03-12 NOTE — Telephone Encounter (Signed)
Pt called stating that she would like X-ray ordered for her lungs. Please advise.

## 2021-03-18 NOTE — Telephone Encounter (Signed)
Spoke to pt. Made appt for tomorrow with Dr Silvio Pate.

## 2021-03-19 ENCOUNTER — Ambulatory Visit (INDEPENDENT_AMBULATORY_CARE_PROVIDER_SITE_OTHER): Payer: Medicare Other | Admitting: Internal Medicine

## 2021-03-19 ENCOUNTER — Encounter: Payer: Self-pay | Admitting: Internal Medicine

## 2021-03-19 ENCOUNTER — Other Ambulatory Visit: Payer: Self-pay

## 2021-03-19 ENCOUNTER — Other Ambulatory Visit: Payer: Self-pay | Admitting: Internal Medicine

## 2021-03-19 DIAGNOSIS — R5383 Other fatigue: Secondary | ICD-10-CM

## 2021-03-19 DIAGNOSIS — M797 Fibromyalgia: Secondary | ICD-10-CM | POA: Diagnosis not present

## 2021-03-19 NOTE — Telephone Encounter (Signed)
°  Encourage patient to contact the pharmacy for refills or they can request refills through [place]:  Please schedule appointment if longer than 1 year  NEXT APPOINTMENT DATE:03/19/21  MEDICATION:HYDROcodone-acetaminophen (NORCO/VICODIN) 5-325 MG tablet  Is the patient out of medication?   PHARMACY:[id number] [name], [place]  Let patient know to contact pharmacy at the end of the day to make sure medication is ready.  Please notify patient to allow 48-72 hours to process  CLINICAL FILLS OUT ALL BELOW:   LAST REFILL:  QTY:  REFILL DATE:    OTHER COMMENTS:    Okay for refill?  Please advise

## 2021-03-19 NOTE — Progress Notes (Signed)
Subjective:    Patient ID: Lindsey Ward, female    DOB: 1950-05-30, 71 y.o.   MRN: [id number]  HPI Here due to fatigue  Not feeling well for 5 weeks "My whole body hurts" Headache, hands, etc Body "gives way" Runny nose, legs/joints hurt  Had prednisone taper for 1 week---didn't feel it helped Then back went out and she got injection from Dr Posey Pronto Then gave longer prednisone course which didn't really help  Hasn't been taking tylenol due to her hydrocodone use (though rarely uses this)  Current Outpatient Medications on File Prior to Visit  Medication Sig Dispense Refill   ALPRAZolam (XANAX) 1 MG tablet TAKE 1 TABLET BY MOUTH THREE TIMES A DAY AS NEEDED 90 tablet 0   amitriptyline (ELAVIL) 25 MG tablet Take 1 tablet (25 mg total) by mouth at bedtime. 30 tablet 3   Carboxymethylcellul-Glycerin (REFRESH OPTIVE OP) Apply to eye. Gel drops lubricant eye gel     cyclobenzaprine (FLEXERIL) 5 MG tablet Take 1 tablet (5 mg total) by mouth at bedtime as needed for muscle spasms. 30 tablet 2   famotidine (PEPCID) 20 MG tablet Take 1 tablet (20 mg total) by mouth 2 (two) times daily. 60 tablet 0   HYDROcodone-acetaminophen (NORCO/VICODIN) 5-325 MG tablet Take 1 tablet by mouth 3 (three) times daily as needed. Dx Code M79.7 90 tablet 0   ketoconazole (NIZORAL) 2 % cream Apply 1 application topically 2 (two) times daily as needed for irritation. 60 g 1   metoCLOPramide (REGLAN) 10 MG tablet Take 1 tablet (10 mg total) by mouth every 6 (six) hours as needed. 30 tablet 0   omeprazole (PRILOSEC) 20 MG capsule Take 1 capsule (20 mg total) by mouth daily. 90 capsule 0   promethazine (PHENERGAN) 12.5 MG tablet TAKE 1-2 TABLETS (12.5-25 MG TOTAL) BY MOUTH 3 (THREE) TIMES DAILY AS NEEDED FOR NAUSEA OR VOMITING. 60 tablet 0   SUMAtriptan (IMITREX) 50 MG tablet TAKE 0.5-1 TABLET BY MOUTH DAILY AS NEEDED FOR MIGRAINE. MAX PER INS. 9 tablet 11   triamcinolone cream (KENALOG) 0.1 % APPLY 1 APPLICATION  TOPICALLY 2 (TWO) TIMES DAILY AS NEEDED. 45 g 1   No current facility-administered medications on file prior to visit.    Allergies  Allergen Reactions   Tizanidine Other (See Comments)    "throws me in a different world"   Aspirin     REACTION: GI bleed   Buspirone Hcl     REACTION: unspecified   Ciprofloxacin     REACTION: unspecified   Citalopram Other (See Comments)    Ear ringing.    Codeine    Diazepam    Duloxetine     REACTION: sz like activity   Ibuprofen     REACTION: GI bleed   Imipramine Hcl Other (See Comments)    Ear ringing.    Oxycodone-Acetaminophen     REACTION: unspecified   Pregabalin     REACTION: kept her up and confusion   Propoxyphene Hcl    Risperidone     REACTION: confusion   Sulfonamide Derivatives     REACTION: unspecified   Tramadol Hcl     REACTION: doesn't remember what happened but couldn't tolerate   Tramadol Hcl Other (See Comments)    REACTION: doesn't remember what happened but couldn't tolerate REACTION: doesn't remember what happened but couldn't tolerate REACTION: doesn't remember what happened but couldn't tolerate    Venlafaxine     REACTION: unspecified   Methocarbamol Rash  Past Medical History:  Diagnosis Date   Bowel obstruction (HCC)    Chronic fatigue    Depression    Fibromyalgia    GERD (gastroesophageal reflux disease)    Hyperlipemia    Migraine    Obstipation    Osteopenia    PUD (peptic ulcer disease)     Past Surgical History:  Procedure Laterality Date   ABDOMINAL SURGERY     APPENDECTOMY  2005   BREAST BIOPSY Bilateral 1999   rt w/out clip - neg, lt w/clip - neg   BREAST CYST ASPIRATION Right 2003   neg   CHOLECYSTECTOMY  2004   COLONOSCOPY WITH PROPOFOL N/A 06/09/2016   Procedure: COLONOSCOPY WITH PROPOFOL;  Surgeon: Jonathon Bellows, MD;  Location: [facility];  Service: Endoscopy;  Laterality: N/A;   ESOPHAGOGASTRODUODENOSCOPY (EGD) WITH PROPOFOL N/A 06/09/2016   Procedure:  ESOPHAGOGASTRODUODENOSCOPY (EGD) WITH PROPOFOL;  Surgeon: Jonathon Bellows, MD;  Location: [facility];  Service: Endoscopy;  Laterality: N/A;   EYE SURGERY     GIVENS CAPSULE STUDY N/A 06/23/2016   Procedure: GIVENS CAPSULE STUDY;  Surgeon: Jonathon Bellows, MD;  Location: [facility];  Service: Endoscopy;  Laterality: N/A;   PARS PLANA VITRECTOMY W/ REPAIR OF MACULAR HOLE Left 01/2017   [facility]   TOTAL ABDOMINAL HYSTERECTOMY W/ BILATERAL SALPINGOOPHORECTOMY  1986    Family History  Problem Relation Age of Onset   Heart disease Mother        AMI   Stroke Mother    Heart disease Father    Hypertension Sister    Breast cancer Sister    Hypertension Sister    Heart disease Sister        MI   Diabetes Sister    Kidney cancer Neg Hx    Kidney disease Neg Hx    Prostate cancer Neg Hx     Social History   Socioeconomic History   Marital status: Single    Spouse name: Not on file   Number of children: 1   Years of education: Not on file   Highest education level: Not on file  Occupational History   Occupation: DISABLED  Tobacco Use   Smoking status: Former    Packs/day: 1.00    Years: 40.00    Pack years: 40.00    Types: Cigarettes    Quit date: 09/25/2013    Years since quitting: 7.4   Smokeless tobacco: Never   Tobacco comments:       Vaping Use   Vaping Use: Never used  Substance and Sexual Activity   Alcohol use: Not Currently    Alcohol/week: 0.0 standard drinks    Comment: rarely   Drug use: No   Sexual activity: Not on file  Other Topics Concern   Not on file  Social History Narrative   Living alone again      No living will   Daughter should make health care decisions   Requests DNR---done 10/24/14   No tube feeds if cognitively unaware   Highest level of education: 9th   Social Determinants of Health   Financial Resource Strain: Not on file  Food Insecurity: Not on file  Transportation Needs: Not on file  Physical Activity: Not on file  Stress: Not on  file  Social Connections: Not on file  Intimate Partner Violence: Not on file   Review of Systems Appetite is off Weight seems stable Sleeps okay No cough or SOB---gets slight tightness in chest (around back) No fever  Objective:   Physical Exam Constitutional:      Appearance: Normal appearance.  Cardiovascular:     Rate and Rhythm: Normal rate and regular rhythm.     Heart sounds: No murmur heard.   No gallop.  Pulmonary:     Effort: Pulmonary effort is normal.     Breath sounds: Normal breath sounds. No wheezing or rales.  Abdominal:     Palpations: Abdomen is soft. There is no mass.     Tenderness: There is no abdominal tenderness.     Comments: No HSM  Musculoskeletal:     Cervical back: Neck supple.     Right lower leg: No edema.     Left lower leg: No edema.     Comments: Normal joints--no synovitis  Lymphadenopathy:     Cervical: No cervical adenopathy.  Neurological:     Mental Status: She is alert.  Psychiatric:        Mood and Affect: Mood normal.        Behavior: Behavior normal.           Assessment & Plan:

## 2021-03-19 NOTE — Patient Instructions (Signed)
Please try acetaminophen--- 2 of the 325 or 500mg  tabs three times a day. If you use the hydrocodone, only take one of acetaminophen with this. If you are not feeling better in the next couple of weeks, let me know and we will decide what the next step is.

## 2021-03-19 NOTE — Addendum Note (Signed)
Addended by: Pilar Grammes on: 03/19/2021 02:33 PM   Modules accepted: Orders

## 2021-03-19 NOTE — Assessment & Plan Note (Signed)
Has generalized pain ----elevated sed rate---but hasn't responded to prednisone (so doubt PMR) Will have her add acetaminophen regularly Rarely uses the norco

## 2021-03-19 NOTE — Telephone Encounter (Signed)
Name of Medication: Hydrocodone Name of Pharmacy: [facility] Last Written Date and Quantity: 11-25-20 #90 Last Office Visit and Type: 03-19-21 Next Office Visit and Type: 03-30-21 Last Controlled Substance Agreement Date: 04-16-20 Last UDS: 04-16-20

## 2021-03-19 NOTE — Assessment & Plan Note (Signed)
Has been sick for about 5 weeks Undiagnosed illness and is some better but having generalized pain, etc Labs fine in October This seems post infectious Will observe for now---recheck labs, etc if this persists

## 2021-03-21 MED ORDER — HYDROCODONE-ACETAMINOPHEN 5-325 MG PO TABS: 1.0000 | ORAL_TABLET | Freq: Three times a day (TID) | ORAL | 0 refills | Status: DC | PRN

## 2021-03-21 NOTE — Addendum Note (Signed)
Addended by: Viviana Simpler I on: 03/21/2021 02:06 PM   Modules accepted: Orders

## 2021-03-30 ENCOUNTER — Ambulatory Visit (INDEPENDENT_AMBULATORY_CARE_PROVIDER_SITE_OTHER): Payer: Medicare Other | Admitting: Internal Medicine

## 2021-03-30 ENCOUNTER — Encounter: Payer: Self-pay | Admitting: Internal Medicine

## 2021-03-30 ENCOUNTER — Other Ambulatory Visit: Payer: Self-pay

## 2021-03-30 DIAGNOSIS — H5713 Ocular pain, bilateral: Secondary | ICD-10-CM

## 2021-03-30 DIAGNOSIS — M797 Fibromyalgia: Secondary | ICD-10-CM | POA: Diagnosis not present

## 2021-03-30 DIAGNOSIS — H5710 Ocular pain, unspecified eye: Secondary | ICD-10-CM | POA: Insufficient documentation

## 2021-03-30 MED ORDER — PROMETHAZINE HCL 12.5 MG PO TABS: 12.5000 mg | ORAL_TABLET | Freq: Three times a day (TID) | ORAL | 0 refills | Status: DC | PRN

## 2021-03-30 MED ORDER — CYCLOBENZAPRINE HCL 5 MG PO TABS: 5.0000 mg | ORAL_TABLET | Freq: Every evening | ORAL | 2 refills | Status: DC | PRN

## 2021-03-30 NOTE — Patient Instructions (Signed)
Try the [facility] for an appointment----[phone]

## 2021-03-30 NOTE — Assessment & Plan Note (Signed)
Ongoing fatigue and pain Will have her retry the cyclobenzaprine 5mg  along with the amitriptyline 25mg  at bedtime

## 2021-03-30 NOTE — Assessment & Plan Note (Signed)
No findings Has seen rheumatology---no clear diagnosis Recommended opto appt--suggested local doctor Continue lubricants

## 2021-03-30 NOTE — Progress Notes (Signed)
Subjective:    Patient ID: Lindsey Ward, female    DOB: 1950/08/30, 71 y.o.   MRN: [id number]  HPI Here due to eye problems  "My eyes are still messed up" "My body still won't go---and I can't get it to go" Will go out to get grocery store---will be wiped out (needs to rest on the recliner--not sleep) Has pain in joints--"all of them"  Eyes are "real sensitive" Sense of grit under her eyelids Eyeball seems sore at times Hasn't gotten into an eye doctor--despite trying  Current Outpatient Medications on File Prior to Visit  Medication Sig Dispense Refill   ALPRAZolam (XANAX) 1 MG tablet TAKE 1 TABLET BY MOUTH THREE TIMES A DAY AS NEEDED 90 tablet 0   amitriptyline (ELAVIL) 25 MG tablet Take 1 tablet (25 mg total) by mouth at bedtime. 30 tablet 3   cyclobenzaprine (FLEXERIL) 5 MG tablet Take 1 tablet (5 mg total) by mouth at bedtime as needed for muscle spasms. 30 tablet 2   famotidine (PEPCID) 20 MG tablet Take 1 tablet (20 mg total) by mouth 2 (two) times daily. 60 tablet 0   HYDROcodone-acetaminophen (NORCO/VICODIN) 5-325 MG tablet Take 1 tablet by mouth 3 (three) times daily as needed. Dx Code M79.7 90 tablet 0   ketoconazole (NIZORAL) 2 % cream Apply 1 application topically 2 (two) times daily as needed for irritation. 60 g 1   metoCLOPramide (REGLAN) 10 MG tablet Take 1 tablet (10 mg total) by mouth every 6 (six) hours as needed. 30 tablet 0   omeprazole (PRILOSEC) 20 MG capsule Take 1 capsule (20 mg total) by mouth daily. 90 capsule 0   Polyethyl Glycol-Propyl Glycol (SYSTANE OP) Apply to eye.     promethazine (PHENERGAN) 12.5 MG tablet TAKE 1-2 TABLETS (12.5-25 MG TOTAL) BY MOUTH 3 (THREE) TIMES DAILY AS NEEDED FOR NAUSEA OR VOMITING. 60 tablet 0   SUMAtriptan (IMITREX) 50 MG tablet TAKE 0.5-1 TABLET BY MOUTH DAILY AS NEEDED FOR MIGRAINE. MAX PER INS. 9 tablet 11   triamcinolone cream (KENALOG) 0.1 % APPLY 1 APPLICATION TOPICALLY 2 (TWO) TIMES DAILY AS NEEDED. 45 g 1   No  current facility-administered medications on file prior to visit.    Allergies  Allergen Reactions   Tizanidine Other (See Comments)    "throws me in a different world"   Aspirin     REACTION: GI bleed   Buspirone Hcl     REACTION: unspecified   Ciprofloxacin     REACTION: unspecified   Citalopram Other (See Comments)    Ear ringing.    Codeine    Diazepam    Duloxetine     REACTION: sz like activity   Ibuprofen     REACTION: GI bleed   Imipramine Hcl Other (See Comments)    Ear ringing.    Oxycodone-Acetaminophen     REACTION: unspecified   Pregabalin     REACTION: kept her up and confusion   Propoxyphene Hcl    Risperidone     REACTION: confusion   Sulfonamide Derivatives     REACTION: unspecified   Tramadol Hcl     REACTION: doesn't remember what happened but couldn't tolerate   Tramadol Hcl Other (See Comments)    REACTION: doesn't remember what happened but couldn't tolerate REACTION: doesn't remember what happened but couldn't tolerate REACTION: doesn't remember what happened but couldn't tolerate    Venlafaxine     REACTION: unspecified   Methocarbamol Rash    Past Medical History:  Diagnosis Date   Bowel obstruction (HCC)    Chronic fatigue    Depression    Fibromyalgia    GERD (gastroesophageal reflux disease)    Hyperlipemia    Migraine    Obstipation    Osteopenia    PUD (peptic ulcer disease)     Past Surgical History:  Procedure Laterality Date   ABDOMINAL SURGERY     APPENDECTOMY  2005   BREAST BIOPSY Bilateral 1999   rt w/out clip - neg, lt w/clip - neg   BREAST CYST ASPIRATION Right 2003   neg   CHOLECYSTECTOMY  2004   COLONOSCOPY WITH PROPOFOL N/A 06/09/2016   Procedure: COLONOSCOPY WITH PROPOFOL;  Surgeon: Jonathon Bellows, MD;  Location: [facility];  Service: Endoscopy;  Laterality: N/A;   ESOPHAGOGASTRODUODENOSCOPY (EGD) WITH PROPOFOL N/A 06/09/2016   Procedure: ESOPHAGOGASTRODUODENOSCOPY (EGD) WITH PROPOFOL;  Surgeon: Jonathon Bellows,  MD;  Location: [facility];  Service: Endoscopy;  Laterality: N/A;   EYE SURGERY     GIVENS CAPSULE STUDY N/A 06/23/2016   Procedure: GIVENS CAPSULE STUDY;  Surgeon: Jonathon Bellows, MD;  Location: [facility];  Service: Endoscopy;  Laterality: N/A;   PARS PLANA VITRECTOMY W/ REPAIR OF MACULAR HOLE Left 01/2017   [facility]   TOTAL ABDOMINAL HYSTERECTOMY W/ BILATERAL SALPINGOOPHORECTOMY  1986    Family History  Problem Relation Age of Onset   Heart disease Mother        AMI   Stroke Mother    Heart disease Father    Hypertension Sister    Breast cancer Sister    Hypertension Sister    Heart disease Sister        MI   Diabetes Sister    Kidney cancer Neg Hx    Kidney disease Neg Hx    Prostate cancer Neg Hx     Social History   Socioeconomic History   Marital status: Single    Spouse name: Not on file   Number of children: 1   Years of education: Not on file   Highest education level: Not on file  Occupational History   Occupation: DISABLED  Tobacco Use   Smoking status: Former    Packs/day: 1.00    Years: 40.00    Pack years: 40.00    Types: Cigarettes    Quit date: 09/25/2013    Years since quitting: 7.5   Smokeless tobacco: Never   Tobacco comments:       Vaping Use   Vaping Use: Never used  Substance and Sexual Activity   Alcohol use: Not Currently    Alcohol/week: 0.0 standard drinks    Comment: rarely   Drug use: No   Sexual activity: Not on file  Other Topics Concern   Not on file  Social History Narrative   Living alone again      No living will   Daughter should make health care decisions   Requests DNR---done 10/24/14   No tube feeds if cognitively unaware   Highest level of education: 9th   Social Determinants of Health   Financial Resource Strain: Not on file  Food Insecurity: Not on file  Transportation Needs: Not on file  Physical Activity: Not on file  Stress: Not on file  Social Connections: Not on file  Intimate Partner Violence: Not  on file   Review of Systems Sleeps well--but generally not refreshed in the morning Some depression --thinks it is secondary to fatigue/pain (not the other way around) Lives alone---does get lonely  Objective:   Physical Exam Constitutional:      Appearance: Normal appearance.  Eyes:     Extraocular Movements: Extraocular movements intact.     Conjunctiva/sclera: Conjunctivae normal.     Pupils: Pupils are equal, round, and reactive to light.     Comments: No findings  Neurological:     Mental Status: She is alert.           Assessment & Plan:

## 2021-04-06 ENCOUNTER — Encounter: Payer: Self-pay | Admitting: Internal Medicine

## 2021-04-06 DIAGNOSIS — M797 Fibromyalgia: Secondary | ICD-10-CM | POA: Diagnosis not present

## 2021-04-06 DIAGNOSIS — M5030 Other cervical disc degeneration, unspecified cervical region: Secondary | ICD-10-CM | POA: Diagnosis not present

## 2021-04-26 DIAGNOSIS — E785 Hyperlipidemia, unspecified: Secondary | ICD-10-CM | POA: Diagnosis not present

## 2021-04-26 DIAGNOSIS — Z79899 Other long term (current) drug therapy: Secondary | ICD-10-CM | POA: Diagnosis not present

## 2021-04-26 DIAGNOSIS — T7840XA Allergy, unspecified, initial encounter: Secondary | ICD-10-CM | POA: Diagnosis not present

## 2021-04-26 DIAGNOSIS — K219 Gastro-esophageal reflux disease without esophagitis: Secondary | ICD-10-CM | POA: Diagnosis not present

## 2021-04-26 DIAGNOSIS — L509 Urticaria, unspecified: Secondary | ICD-10-CM | POA: Diagnosis not present

## 2021-04-26 DIAGNOSIS — Z7952 Long term (current) use of systemic steroids: Secondary | ICD-10-CM | POA: Diagnosis not present

## 2021-04-26 DIAGNOSIS — L299 Pruritus, unspecified: Secondary | ICD-10-CM | POA: Diagnosis not present

## 2021-04-26 DIAGNOSIS — L500 Allergic urticaria: Secondary | ICD-10-CM | POA: Diagnosis not present

## 2021-04-26 DIAGNOSIS — R21 Rash and other nonspecific skin eruption: Secondary | ICD-10-CM | POA: Diagnosis not present

## 2021-04-27 DIAGNOSIS — M797 Fibromyalgia: Secondary | ICD-10-CM | POA: Diagnosis not present

## 2021-04-27 DIAGNOSIS — L501 Idiopathic urticaria: Secondary | ICD-10-CM | POA: Diagnosis not present

## 2021-05-11 ENCOUNTER — Ambulatory Visit (INDEPENDENT_AMBULATORY_CARE_PROVIDER_SITE_OTHER): Payer: Medicare Other | Admitting: Internal Medicine

## 2021-05-11 ENCOUNTER — Other Ambulatory Visit: Payer: Self-pay

## 2021-05-11 ENCOUNTER — Encounter: Payer: Self-pay | Admitting: Internal Medicine

## 2021-05-11 ENCOUNTER — Other Ambulatory Visit: Payer: Self-pay | Admitting: Family Medicine

## 2021-05-11 DIAGNOSIS — M797 Fibromyalgia: Secondary | ICD-10-CM | POA: Diagnosis not present

## 2021-05-11 DIAGNOSIS — K219 Gastro-esophageal reflux disease without esophagitis: Secondary | ICD-10-CM

## 2021-05-11 MED ORDER — ALPRAZOLAM 1 MG PO TABS: 1.0000 mg | ORAL_TABLET | Freq: Three times a day (TID) | ORAL | 0 refills | Status: DC | PRN

## 2021-05-11 MED ORDER — METHYLPREDNISOLONE ACETATE 40 MG/ML IJ SUSP
40.0000 mg | Freq: Once | INTRAMUSCULAR | Status: AC
  Administered 2021-05-11: 40 mg via INTRAMUSCULAR

## 2021-05-11 NOTE — Assessment & Plan Note (Signed)
Severe and resistant (or intolerant) of all Rx Has responded briefly to steroids Will give depomedrol 40 IM to see if she gets enough relief to stay out of bed

## 2021-05-11 NOTE — Progress Notes (Signed)
Subjective:    Patient ID: Lindsey Ward, female    DOB: 19-Jan-1951, 71 y.o.   MRN: [id number]  HPI Here due to ongoing muscle aching  "My whole body is hurting so bad, like it is in knots, I don't know what to do" Hurts from top of head to toes Needs to "go home and go to bed"  Only has 2 days recently that she has been able to stay out of bed States she is not depressed--just the pain Just watches TV mostly  Current Outpatient Medications on File Prior to Visit  Medication Sig Dispense Refill   ALPRAZolam (XANAX) 1 MG tablet TAKE 1 TABLET BY MOUTH THREE TIMES A DAY AS NEEDED 90 tablet 0   cyclobenzaprine (FLEXERIL) 5 MG tablet Take 1 tablet (5 mg total) by mouth at bedtime as needed for muscle spasms. 30 tablet 2   HYDROcodone-acetaminophen (NORCO/VICODIN) 5-325 MG tablet Take 1 tablet by mouth 3 (three) times daily as needed. Dx Code M79.7 90 tablet 0   ketoconazole (NIZORAL) 2 % cream Apply 1 application topically 2 (two) times daily as needed for irritation. 60 g 1   Polyethyl Glycol-Propyl Glycol (SYSTANE OP) Apply to eye.     promethazine (PHENERGAN) 12.5 MG tablet Take 1-2 tablets (12.5-25 mg total) by mouth 3 (three) times daily as needed for nausea or vomiting. 60 tablet 0   SUMAtriptan (IMITREX) 50 MG tablet TAKE 0.5-1 TABLET BY MOUTH DAILY AS NEEDED FOR MIGRAINE. MAX PER INS. 9 tablet 11   triamcinolone cream (KENALOG) 0.1 % APPLY 1 APPLICATION TOPICALLY 2 (TWO) TIMES DAILY AS NEEDED. 45 g 1   omeprazole (PRILOSEC) 20 MG capsule Take 1 capsule (20 mg total) by mouth daily. (Patient not taking: Reported on 05/11/2021) 90 capsule 0   No current facility-administered medications on file prior to visit.    Allergies  Allergen Reactions   Tizanidine Other (See Comments)    "throws me in a different world"   Aspirin     REACTION: GI bleed   Buspirone Hcl     REACTION: unspecified   Ciprofloxacin     REACTION: unspecified   Citalopram Other (See Comments)    Ear  ringing.    Codeine    Diazepam    Duloxetine     REACTION: sz like activity   Ibuprofen     REACTION: GI bleed   Imipramine Hcl Other (See Comments)    Ear ringing.    Oxycodone-Acetaminophen     REACTION: unspecified   Pregabalin     REACTION: kept her up and confusion   Propoxyphene Hcl    Risperidone     REACTION: confusion   Sulfonamide Derivatives     REACTION: unspecified   Tramadol Hcl     REACTION: doesn't remember what happened but couldn't tolerate   Tramadol Hcl Other (See Comments)    REACTION: doesn't remember what happened but couldn't tolerate REACTION: doesn't remember what happened but couldn't tolerate REACTION: doesn't remember what happened but couldn't tolerate    Venlafaxine     REACTION: unspecified   Methocarbamol Rash    Past Medical History:  Diagnosis Date   Bowel obstruction (HCC)    Chronic fatigue    Depression    Fibromyalgia    GERD (gastroesophageal reflux disease)    Hyperlipemia    Migraine    Obstipation    Osteopenia    PUD (peptic ulcer disease)     Past Surgical History:  Procedure Laterality Date  ABDOMINAL SURGERY     APPENDECTOMY  2005   BREAST BIOPSY Bilateral 1999   rt w/out clip - neg, lt w/clip - neg   BREAST CYST ASPIRATION Right 2003   neg   CHOLECYSTECTOMY  2004   COLONOSCOPY WITH PROPOFOL N/A 06/09/2016   Procedure: COLONOSCOPY WITH PROPOFOL;  Surgeon: Jonathon Bellows, MD;  Location: [facility];  Service: Endoscopy;  Laterality: N/A;   ESOPHAGOGASTRODUODENOSCOPY (EGD) WITH PROPOFOL N/A 06/09/2016   Procedure: ESOPHAGOGASTRODUODENOSCOPY (EGD) WITH PROPOFOL;  Surgeon: Jonathon Bellows, MD;  Location: [facility];  Service: Endoscopy;  Laterality: N/A;   EYE SURGERY     GIVENS CAPSULE STUDY N/A 06/23/2016   Procedure: GIVENS CAPSULE STUDY;  Surgeon: Jonathon Bellows, MD;  Location: [facility];  Service: Endoscopy;  Laterality: N/A;   PARS PLANA VITRECTOMY W/ REPAIR OF MACULAR HOLE Left 01/2017   [facility]   TOTAL  ABDOMINAL HYSTERECTOMY W/ BILATERAL SALPINGOOPHORECTOMY  1986    Family History  Problem Relation Age of Onset   Heart disease Mother        AMI   Stroke Mother    Heart disease Father    Hypertension Sister    Breast cancer Sister    Hypertension Sister    Heart disease Sister        MI   Diabetes Sister    Kidney cancer Neg Hx    Kidney disease Neg Hx    Prostate cancer Neg Hx     Social History   Socioeconomic History   Marital status: Single    Spouse name: Not on file   Number of children: 1   Years of education: Not on file   Highest education level: Not on file  Occupational History   Occupation: DISABLED  Tobacco Use   Smoking status: Former    Packs/day: 1.00    Years: 40.00    Pack years: 40.00    Types: Cigarettes    Quit date: 09/25/2013    Years since quitting: 7.6   Smokeless tobacco: Never   Tobacco comments:       Vaping Use   Vaping Use: Never used  Substance and Sexual Activity   Alcohol use: Not Currently    Alcohol/week: 0.0 standard drinks    Comment: rarely   Drug use: No   Sexual activity: Not on file  Other Topics Concern   Not on file  Social History Narrative   Living alone again      No living will   Daughter should make health care decisions   Requests DNR---done 10/24/14   No tube feeds if cognitively unaware   Highest level of education: 9th   Social Determinants of Health   Financial Resource Strain: Not on file  Food Insecurity: Not on file  Transportation Needs: Not on file  Physical Activity: Not on file  Stress: Not on file  Social Connections: Not on file  Intimate Partner Violence: Not on file   Review of Systems Sleep is variable--- anywhere from 1 to 4-5 hours No fever--not really sick Only uses the hydrocodone rarely (but doesn't help her stay out of bed)    Objective:   Physical Exam Constitutional:      Appearance: Normal appearance.  Musculoskeletal:     Comments: No joint swelling or synovitis   Neurological:     Mental Status: She is alert.  Psychiatric:     Comments: Normal conversation Doesn't seem depressed           Assessment &  Plan:

## 2021-05-11 NOTE — Addendum Note (Signed)
Addended by: Pilar Grammes on: 05/11/2021 12:36 PM   Modules accepted: Orders

## 2021-05-28 DIAGNOSIS — H43391 Other vitreous opacities, right eye: Secondary | ICD-10-CM | POA: Diagnosis not present

## 2021-05-28 DIAGNOSIS — H02051 Trichiasis without entropian right upper eyelid: Secondary | ICD-10-CM | POA: Diagnosis not present

## 2021-05-28 DIAGNOSIS — H35373 Puckering of macula, bilateral: Secondary | ICD-10-CM | POA: Diagnosis not present

## 2021-05-28 DIAGNOSIS — H43811 Vitreous degeneration, right eye: Secondary | ICD-10-CM | POA: Diagnosis not present

## 2021-06-01 ENCOUNTER — Telehealth: Payer: Self-pay | Admitting: Internal Medicine

## 2021-06-01 NOTE — Telephone Encounter (Signed)
Pt called stating that Dr Silvio Pate stated if she didn't feel in better from her visit on 05/11/21 to call  and let him know. Pt states that she still having body cramps and now feels like its at the top of her head.Transferring pt to access nurse. Please advise. ?

## 2021-06-02 NOTE — Telephone Encounter (Signed)
Spoke to pt. She said she is doing a little better today. Has starting taking Tylenol that seems to be helping. Asked how much she can take a day and I advised no more than 4000 mg in 24 hours. She said she is taking 2 tab 3 times daily for now and it seems to be helping. ?

## 2021-06-02 NOTE — Telephone Encounter (Signed)
Please see Dr Silvio Pate note. Sending note to [facility] CMA. ? ?[place] [facility] - Client ?TELEPHONE ADVICE RECORD ?AccessNurse? ?Patient ?Name: ?Lindsey B ?Ward ?Gender: Female ?DOB: [birth year] ?Age: 71 Y 12 M 6 D ?Return ?Phone ?Number: ?[id number] ?(Primary), ?[id number] ?(Secondary) ?Address: ?City/ ?State/ ?Zip: ?[place] ? [address] ?Client [place] [facility] - Client ?Client Site [place] [facility] ?Provider Viviana Simpler- MD ?Contact Type Call ?Who Is Calling Patient / Member / Family / Caregiver ?Call Type Triage / Clinical ?Relationship To Patient Self ?Return Phone Number [phone] (Secondary) ?Chief Complaint Muscle pain ?Reason for Call Symptomatic / Request for Health Information ?Initial Comment Caller states she is calling to triage the patient. ?She is having body cramps. Doctor stated if she ?didn't feel any better to call back, and now they are ?moving to the top of her head. The head cramps ?just started yesterday. No other symptoms. ?Translation No ?Nurse Assessment ?Nurse: Joya Gaskins, RN, Vonna Kotyk Date/Time [name] Time): 06/01/2021 4:51:30 PM ?Confirm and document reason for call. If ?symptomatic, describe symptoms. ?---Caller states that she is having cramping pain in her ?head that began about an hour ago, along with body ?cramps, pins sensation in her feet. This sensation has ?been present since last month when she saw MD; she ?has fibromyalgia. ?Does the patient have any new or worsening ?symptoms? ---Yes ?Will a triage be completed? ---Yes ?Related visit to physician within the last 2 weeks? ---No ?Does the PT have any chronic conditions? (i.e. ?diabetes, asthma, this includes High risk factors for ?pregnancy, etc.) ?---Yes ?List chronic conditions. ---fibromyalgia, neuropathy ?Is this a behavioral health or substance abuse call? ---No ?Guidelines ?Guideline Title Affirmed Question Affirmed Notes Nurse Date/Time  (Eastern ?Time) ?Headache [1] New headache ?AND [2] age > 63 ?Joya Gaskins, RN, Vonna Kotyk 06/01/2021 4:53:48 ?PM ?Disp. Time (Eastern ?Time) Disposition Final User ?06/01/2021 4:58:08 PM See PCP within 24 Hours Yes Joya Gaskins, RN, Vonna Kotyk ?PLEASE NOTE: All timestamps contained within this report are represented as [place] Standard Time. ?CONFIDENTIALTY NOTICE: This fax transmission is intended only for the addressee. It contains information that is legally privileged, confidential or ?otherwise protected from use or disclosure. If you are not the intended recipient, you are strictly prohibited from reviewing, disclosing, copying using ?or disseminating any of this information or taking any action in reliance on or regarding this information. If you have received this fax in error, please ?notify us immediately by telephone so that we can arrange for its return to [place]. Phone: [phone], Toll-Free: [phone], Fax: [phone] ?Page: 2 of 2 ?Call Id: [id number] ?Caller Disagree/Comply Comply ?Caller Understands Yes ?PreDisposition Did not know what to do ?Care Advice Given Per Guideline ?SEE PCP WITHIN 24 HOURS: * IF OFFICE WILL BE OPEN: You need to be examined within the next 24 hours. Call your doctor ?(or NP/PA) when the office opens and make an appointment. PAIN MEDICINES: * Lie down in a dark quiet place and relax until ?feeling better. REST: CALL BACK IF: * Blurred vision or double vision occurs * Numbness or weakness of the face, arm or leg * ?You become worse CARE ADVICE given per Headache (Adult) guideline. ?Referrals ?REFERRED TO PCP OFFIC ?

## 2021-06-02 NOTE — Telephone Encounter (Signed)
Spoke to pt about the 3000 mg.  ?

## 2021-06-09 ENCOUNTER — Other Ambulatory Visit: Payer: Self-pay | Admitting: Internal Medicine

## 2021-06-09 NOTE — Telephone Encounter (Signed)
Is this okay to refill ? ?Last OV 03/19/21 ?Filled 05/07/21 ?Next OV 06/15/21 ?

## 2021-06-22 ENCOUNTER — Ambulatory Visit (INDEPENDENT_AMBULATORY_CARE_PROVIDER_SITE_OTHER): Payer: Medicare Other | Admitting: Internal Medicine

## 2021-06-22 ENCOUNTER — Encounter: Payer: Self-pay | Admitting: Internal Medicine

## 2021-06-22 VITALS — BP 118/68 | HR 69 | Ht 63.0 in | Wt 120.0 lb

## 2021-06-22 DIAGNOSIS — M797 Fibromyalgia: Secondary | ICD-10-CM

## 2021-06-22 DIAGNOSIS — F32A Depression, unspecified: Secondary | ICD-10-CM

## 2021-06-22 DIAGNOSIS — F1120 Opioid dependence, uncomplicated: Secondary | ICD-10-CM | POA: Diagnosis not present

## 2021-06-22 DIAGNOSIS — F39 Unspecified mood [affective] disorder: Secondary | ICD-10-CM | POA: Diagnosis not present

## 2021-06-22 DIAGNOSIS — R5383 Other fatigue: Secondary | ICD-10-CM | POA: Diagnosis not present

## 2021-06-22 LAB — COMPREHENSIVE METABOLIC PANEL
ALT: 11 U/L (ref 0–35)
AST: 23 U/L (ref 0–37)
Albumin: 4.1 g/dL (ref 3.5–5.2)
Alkaline Phosphatase: 78 U/L (ref 39–117)
BUN: 12 mg/dL (ref 6–23)
CO2: 30 mEq/L (ref 19–32)
Calcium: 9.9 mg/dL (ref 8.4–10.5)
Chloride: 102 mEq/L (ref 96–112)
Creatinine, Ser: 0.68 mg/dL (ref 0.40–1.20)
GFR: 86.76 mL/min (ref >60.00)
Glucose, Bld: 90 mg/dL (ref 70–99)
Potassium: 4.4 mEq/L (ref 3.5–5.1)
Sodium: 138 mEq/L (ref 135–145)
Total Bilirubin: 0.4 mg/dL (ref 0.2–1.2)
Total Protein: 6.6 g/dL (ref 6.0–8.3)

## 2021-06-22 LAB — CBC
HCT: 36 % (ref 36.0–46.0)
Hemoglobin: 12.1 g/dL (ref 12.0–15.0)
MCHC: 33.6 g/dL (ref 30.0–36.0)
MCV: 92.5 fl (ref 78.0–100.0)
Platelets: 355 10*3/uL (ref 150.0–400.0)
RBC: 3.89 Mil/uL (ref 3.87–5.11)
RDW: 15.7 % — ABNORMAL HIGH (ref 11.5–15.5)
WBC: 6.8 10*3/uL (ref 4.0–10.5)

## 2021-06-22 LAB — SEDIMENTATION RATE: Sed Rate: 19 mm/hr (ref 0–30)

## 2021-06-22 LAB — T4, FREE: Free T4: 1.03 ng/dL (ref 0.60–1.60)

## 2021-06-22 MED ORDER — METHYLPHENIDATE HCL 5 MG PO TABS: 5.0000 mg | ORAL_TABLET | Freq: Two times a day (BID) | ORAL | 0 refills | Status: DC

## 2021-06-22 MED ORDER — HYDROCODONE-ACETAMINOPHEN 5-325 MG PO TABS: 1.0000 | ORAL_TABLET | Freq: Three times a day (TID) | ORAL | 0 refills | Status: DC | PRN

## 2021-06-22 NOTE — Assessment & Plan Note (Signed)
Seems to be secondary ?Hopefully ritalin will help ?

## 2021-06-22 NOTE — Assessment & Plan Note (Signed)
Uses the hydrocodone approximately one a day--will refill ?

## 2021-06-22 NOTE — Assessment & Plan Note (Signed)
Ongoing dysthymia--but not really MDD ?Related to chronic pain and fibromyalgia ?Hasn't tolerated any antidepressant ?Energy issues---will try ritalin 5 bi ?

## 2021-06-22 NOTE — Assessment & Plan Note (Signed)
And ongoing fatigue ?No answers from rheumatology either---will recheck labs ?

## 2021-06-22 NOTE — Progress Notes (Signed)
? ?Subjective:  ? ? Patient ID: Lindsey Ward, female    DOB: [birth year], 71 y.o.   MRN: [id number] ? ?HPI ?Here due to problems with pain and fatigue ? ?"My body is breaking down" ?Spends most of her time in bed--hard to get up ?Gets up to use bathroom or get something to eat ?Not cooking--but will make a sandwich ? ?More migraines ---but imitrex does help ? ?Reviewed last labs--fairly unremarkable except GFR 39 ? ?Didn't do well with prednisone --- slight response at first, but not when Dr Posey Pronto gave it ? ?Uses the xanax only for panic/anxiety attacks ?Not nearly every day ? ?Current Outpatient Medications on File Prior to Visit  ?Medication Sig Dispense Refill  ? ALPRAZolam (XANAX) 1 MG tablet Take 1 tablet (1 mg total) by mouth 3 (three) times daily as needed. 90 tablet 0  ? cyclobenzaprine (FLEXERIL) 5 MG tablet Take 1 tablet (5 mg total) by mouth at bedtime as needed for muscle spasms. 30 tablet 2  ? HYDROcodone-acetaminophen (NORCO/VICODIN) 5-325 MG tablet Take 1 tablet by mouth 3 (three) times daily as needed. Dx Code M79.7 90 tablet 0  ? ketoconazole (NIZORAL) 2 % cream Apply 1 application topically 2 (two) times daily as needed for irritation. 60 g 1  ? omeprazole (PRILOSEC) 20 MG capsule TAKE 1 CAPSULE BY MOUTH EVERY DAY 90 capsule 3  ? Polyethyl Glycol-Propyl Glycol (SYSTANE OP) Apply to eye.    ? promethazine (PHENERGAN) 12.5 MG tablet Take 1-2 tablets (12.5-25 mg total) by mouth 3 (three) times daily as needed for nausea or vomiting. 60 tablet 0  ? SUMAtriptan (IMITREX) 50 MG tablet TAKE 0.5-1 TABLET BY MOUTH DAILY AS NEEDED FOR MIGRAINE. 9 tablet 11  ? triamcinolone cream (KENALOG) 0.1 % APPLY 1 APPLICATION TOPICALLY 2 (TWO) TIMES DAILY AS NEEDED. 45 g 1  ? ?No current facility-administered medications on file prior to visit.  ? ? ?Allergies  ?Allergen Reactions  ? Tizanidine Other (See Comments)  ?  "throws me in a different world"  ? Aspirin   ?  REACTION: GI bleed  ? Buspirone Hcl   ?  REACTION:  unspecified  ? Ciprofloxacin   ?  REACTION: unspecified  ? Citalopram Other (See Comments)  ?  Ear ringing.   ? Codeine   ? Diazepam   ? Duloxetine   ?  REACTION: sz like activity  ? Ibuprofen   ?  REACTION: GI bleed  ? Imipramine Hcl Other (See Comments)  ?  Ear ringing.   ? Oxycodone-Acetaminophen   ?  REACTION: unspecified  ? Pregabalin   ?  REACTION: kept her up and confusion  ? Propoxyphene Hcl   ? Risperidone   ?  REACTION: confusion  ? Sulfonamide Derivatives   ?  REACTION: unspecified  ? Tramadol Hcl   ?  REACTION: doesn't remember what happened but couldn't tolerate  ? Tramadol Hcl Other (See Comments)  ?  REACTION: doesn't remember what happened but couldn't tolerate ?REACTION: doesn't remember what happened but couldn't tolerate ?REACTION: doesn't remember what happened but couldn't tolerate ?  ? Venlafaxine   ?  REACTION: unspecified  ? Methocarbamol Rash  ? ? ?Past Medical History:  ?Diagnosis Date  ? Bowel obstruction ([place])   ? Chronic fatigue   ? Depression   ? Fibromyalgia   ? GERD (gastroesophageal reflux disease)   ? Hyperlipemia   ? Migraine   ? Obstipation   ? Osteopenia   ? PUD (peptic ulcer disease)   ? ? ?  Past Surgical History:  ?Procedure Laterality Date  ? ABDOMINAL SURGERY    ? APPENDECTOMY  2005  ? BREAST BIOPSY Bilateral 1999  ? rt w/out clip - neg, lt w/clip - neg  ? BREAST CYST ASPIRATION Right 2003  ? neg  ? CHOLECYSTECTOMY  2004  ? COLONOSCOPY WITH PROPOFOL N/A 06/09/2016  ? Procedure: COLONOSCOPY WITH PROPOFOL;  Surgeon: Jonathon Bellows, MD;  Location: [facility] ENDOSCOPY;  Service: Endoscopy;  Laterality: N/A;  ? ESOPHAGOGASTRODUODENOSCOPY (EGD) WITH PROPOFOL N/A 06/09/2016  ? Procedure: ESOPHAGOGASTRODUODENOSCOPY (EGD) WITH PROPOFOL;  Surgeon: Jonathon Bellows, MD;  Location: [facility] ENDOSCOPY;  Service: Endoscopy;  Laterality: N/A;  ? EYE SURGERY    ? GIVENS CAPSULE STUDY N/A 06/23/2016  ? Procedure: GIVENS CAPSULE STUDY;  Surgeon: Jonathon Bellows, MD;  Location[facility] ENDOSCOPY;  Service: Endoscopy;   Laterality: N/A;  ? PARS PLANA VITRECTOMY W/ REPAIR OF MACULAR HOLE Left 01/2017  ? [place]  ? TOTAL ABDOMINAL HYSTERECTOMY W/ BILATERAL SALPINGOOPHORECTOMY  1986  ? ? ?Family History  ?Problem Relation Age of Onset  ? Heart disease Mother   ?     AMI  ? Stroke Mother   ? Heart disease Father   ? Hypertension Sister   ? Breast cancer Sister   ? Hypertension Sister   ? Heart disease Sister   ?     MI  ? Diabetes Sister   ? Kidney cancer Neg Hx   ? Kidney disease Neg Hx   ? Prostate cancer Neg Hx   ? ? ?Social History  ? ?Socioeconomic History  ? Marital status: Single  ?  Spouse name: Not on file  ? Number of children: 1  ? Years of education: Not on file  ? Highest education level: Not on file  ?Occupational History  ? Occupation: DISABLED  ?Tobacco Use  ? Smoking status: Former  ?  Packs/day: 1.00  ?  Years: 40.00  ?  Pack years: 40.00  ?  Types: Cigarettes  ?  Quit date: 09/25/2013  ?  Years since quitting: 7.7  ? Smokeless tobacco: Never  ? Tobacco comments:  ?     ?Vaping Use  ? Vaping Use: Never used  ?Substance and Sexual Activity  ? Alcohol use: Not Currently  ?  Alcohol/week: 0.0 standard drinks  ?  Comment: rarely  ? Drug use: No  ? Sexual activity: Not on file  ?Other Topics Concern  ? Not on file  ?Social History Narrative  ? Living alone again  ?   ? No living will  ? Daughter should make health care decisions  ? Requests DNR---done 10/24/14  ? No tube feeds if cognitively unaware  ? Highest level of education: 9th  ? ?Social Determinants of Health  ? ?Financial Resource Strain: Not on file  ?Food Insecurity: Not on file  ?Transportation Needs: Not on file  ?Physical Activity: Not on file  ?Stress: Not on file  ?Social Connections: Not on file  ?Intimate Partner Violence: Not on file  ? ?Review of Systems ?Only sleeps around 4-5 hours at night ?Not overly depressed--but has ongoing low level symptoms ?No suicidal or death thoughts ?Still uses the hydrocodone--but not daily ?Has 2 spots on back and 1 on lip to  be checked ? ?   ?Objective:  ? Physical Exam ?Constitutional:   ?   Comments: NAD--but looks tired  ?HENT:  ?   Mouth/Throat:  ?   Comments: Small pimple on inside of right upper lip--just came out yesterday (observation  only) ? ?2 seb keratoses on back ?Neurological:  ?   Mental Status: She is alert.  ?  ? ? ? ? ?   ?Assessment & Plan:  ? ?

## 2021-06-23 ENCOUNTER — Telehealth: Payer: Self-pay

## 2021-06-23 NOTE — Telephone Encounter (Signed)
Hopefully she can tolerate the lower dose---she has had problems with just about every medication we have ever tried ?

## 2021-06-23 NOTE — Telephone Encounter (Signed)
[place] [facility] - Client ?TELEPHONE ADVICE RECORD ?AccessNurse? ?Patient ?Name: ?Akyia B ?EAL ?Gender: Female ?DOB: 18-Sep-1950 ?Age: 71 Y 40 M 28 D ?Return ?Phone ?Number: ?[id number] ?(Primary) ?Address: ?City/ ?State/ ?Zip: ?[place] ? [address] ?Client [place] [facility] - Client ?Client Site [place] - Day ?Provider Viviana Simpler- MD ?Contact Type Call ?Who Is Calling Patient / Member / Family / Caregiver ?Call Type Triage / Clinical ?Caller Name Verdene Lennert from answering service ?Relationship To Patient Other ?Return Phone Number [phone] (Primary) ?Chief Complaint Medication reaction ?Reason for Call Symptomatic / Request for Health Information ?Initial Comment Caller states they have started taken - ?Methylphenidate('5mg'$ s) this morning. After this ?she just started falling all over. ?Translation No ?Nurse Assessment ?Nurse: Rolin Barry RN, Levada Dy Date/Time [name] Time): 06/23/2021 11:39:07 AM ?Confirm and document reason for call. If ?symptomatic, describe symptoms. ?---Caller states they have started taken - ?Methylphenidate('5mg'$ ) this morning. After this she just ?started falling all over. Caller is by herself. Caller did ?not hurt herself when falling. ?Does the patient have any new or worsening ?symptoms? ---Yes ?Will a triage be completed? ---Yes ?Related visit to physician within the last 2 weeks? ---Yes ?Does the PT have any chronic conditions? (i.e. ?diabetes, asthma, this includes High risk factors for ?pregnancy, etc.) ?---Yes ?List chronic conditions. ---Fibromyalgia depression migraines ?Is this a behavioral health or substance abuse call? ---No ?Guidelines ?Guideline Title Affirmed Question Affirmed Notes Nurse Date/Time (Eastern ?Time) ?Falls and Falling [1] MODERATE ?weakness (i.e., ?interferes with work, ?school, normal ?activities) AND ?[2] new-onset or ?worsening ?Rolin Barry, RN, Levada Dy 06/23/2021 11:42:58 ?AM ?PLEASE NOTE: All  timestamps contained within this report are represented as [place] Standard Time. ?CONFIDENTIALTY NOTICE: This fax transmission is intended only for the addressee. It contains information that is legally privileged, confidential or ?otherwise protected from use or disclosure. If you are not the intended recipient, you are strictly prohibited from reviewing, disclosing, copying using ?or disseminating any of this information or taking any action in reliance on or regarding this information. If you have received this fax in error, please ?notify us immediately by telephone so that we can arrange for its return to [place]. Phone: [phone], Toll-Free: [phone], Fax: [phone] ?Page: 2 of 2 ?Call Id: [id number] ?Disp. Time (Eastern ?Time) Disposition Final User ?06/23/2021 11:47:26 AM See HCP within 4 Hours (or ?PCP triage) ?Yes Deaton, RN, Levada Dy ?Caller Disagree/Comply Comply ?Caller Understands Yes ?PreDisposition Did not know what to do ?Care Advice Given Per Guideline ?SEE HCP (OR PCP TRIAGE) WITHIN 4 HOURS: * IF OFFICE WILL BE OPEN: You need to be seen within the next 3 or 4 ?hours. Call your doctor (or NP/PA) now or as soon as the office opens. CALL BACK IF: * You become worse CARE ADVICE ?given per Fall and Falling (Adult) guideline. ?Comments ?User: Saverio Danker, RN Date/Time [name] Time): 06/23/2021 12:00:11 PM ?Reached backline, advised no appts available. Advised caller to go to the ED. Caller stated that she did not want ?to go in, states that she did not hurt herself. Caller wanted to make sure there were no interactions with ritalin and ?imitrex. No interactions on drugs.com. Caller denies hitting her head. ?Referrals ?REFERRED TO PCP OFFIC ?

## 2021-06-23 NOTE — Telephone Encounter (Signed)
Pt just started methylphenidate 5 mg this morning and shortly after taking med pt had migraine h/a that has now gone away after taking imitrex. Pt said legs were weak and could not stand without falling for about 10 - 15 mins. Pt knew where she was and no slurred speech and no dizziness. No CP or SOB. Pt said right now she is OK, nothing is bothering her. Pt said she is afraid to drive and does not have anyone to take her anywhere this afternoon. Pt refuses EMS and going to ED. Pt wants to know if she should take the methylphenidate at supper or not. Pt request cb after reviewed. Sending note to Dr Silvio Pate who is still in office and Dr Silvio Pate said to try cutting the methylphenidate 5 mg in half and take 1/2 tab in the morning and 1/2 tab at lunch or after lunch. Pt voiced understanding UC & ED precautions given and pt voiced understanding.sending note to Dr Silvio Pate. ?

## 2021-06-30 ENCOUNTER — Ambulatory Visit (INDEPENDENT_AMBULATORY_CARE_PROVIDER_SITE_OTHER): Payer: Medicare Other | Admitting: Internal Medicine

## 2021-06-30 ENCOUNTER — Encounter: Payer: Self-pay | Admitting: Internal Medicine

## 2021-06-30 DIAGNOSIS — F39 Unspecified mood [affective] disorder: Secondary | ICD-10-CM | POA: Diagnosis not present

## 2021-06-30 NOTE — Progress Notes (Signed)
? ?Subjective:  ? ? Patient ID: Lindsey Ward, female    DOB: [birth year], 71 y.o.   MRN: [id number] ? ?HPI ?Here due to problems with the ritalin ? ?The first time she took the ritalin--she fell getting out of bed ?Trouble getting back up ?Now taking 1/2 tab at 8am and 2PM ?It is helping ---"can get out and go" ? ?Only issues is awakening with headache---classic tension HA  ?Seems to improve with pressure from her pillow ?Discussed tylenol also ?Didn't happen this morning ? ?Current Outpatient Medications on File Prior to Visit  ?Medication Sig Dispense Refill  ? ALPRAZolam (XANAX) 1 MG tablet Take 1 tablet (1 mg total) by mouth 3 (three) times daily as needed. 90 tablet 0  ? HYDROcodone-acetaminophen (NORCO/VICODIN) 5-325 MG tablet Take 1 tablet by mouth 3 (three) times daily as needed. Dx Code M79.7 90 tablet 0  ? ketoconazole (NIZORAL) 2 % cream Apply 1 application topically 2 (two) times daily as needed for irritation. 60 g 1  ? methylphenidate (RITALIN) 5 MG tablet Take 1 tablet (5 mg total) by mouth 2 (two) times daily. 60 tablet 0  ? omeprazole (PRILOSEC) 20 MG capsule TAKE 1 CAPSULE BY MOUTH EVERY DAY 90 capsule 3  ? Polyethyl Glycol-Propyl Glycol (SYSTANE OP) Apply to eye.    ? promethazine (PHENERGAN) 12.5 MG tablet Take 1-2 tablets (12.5-25 mg total) by mouth 3 (three) times daily as needed for nausea or vomiting. 60 tablet 0  ? SUMAtriptan (IMITREX) 50 MG tablet TAKE 0.5-1 TABLET BY MOUTH DAILY AS NEEDED FOR MIGRAINE. 9 tablet 11  ? triamcinolone cream (KENALOG) 0.1 % APPLY 1 APPLICATION TOPICALLY 2 (TWO) TIMES DAILY AS NEEDED. 45 g 1  ? ?No current facility-administered medications on file prior to visit.  ? ? ?Allergies  ?Allergen Reactions  ? Tizanidine Other (See Comments)  ?  "throws me in a different world"  ? Aspirin   ?  REACTION: GI bleed  ? Buspirone Hcl   ?  REACTION: unspecified  ? Ciprofloxacin   ?  REACTION: unspecified  ? Citalopram Other (See Comments)  ?  Ear ringing.   ? Codeine   ?  Diazepam   ? Duloxetine   ?  REACTION: sz like activity  ? Ibuprofen   ?  REACTION: GI bleed  ? Imipramine Hcl Other (See Comments)  ?  Ear ringing.   ? Oxycodone-Acetaminophen   ?  REACTION: unspecified  ? Pregabalin   ?  REACTION: kept her up and confusion  ? Propoxyphene Hcl   ? Risperidone   ?  REACTION: confusion  ? Sulfonamide Derivatives   ?  REACTION: unspecified  ? Tramadol Hcl   ?  REACTION: doesn't remember what happened but couldn't tolerate  ? Tramadol Hcl Other (See Comments)  ?  REACTION: doesn't remember what happened but couldn't tolerate ?REACTION: doesn't remember what happened but couldn't tolerate ?REACTION: doesn't remember what happened but couldn't tolerate ?  ? Venlafaxine   ?  REACTION: unspecified  ? Methocarbamol Rash  ? ? ?Past Medical History:  ?Diagnosis Date  ? Bowel obstruction ([place])   ? Chronic fatigue   ? Depression   ? Fibromyalgia   ? GERD (gastroesophageal reflux disease)   ? Hyperlipemia   ? Migraine   ? Obstipation   ? Osteopenia   ? PUD (peptic ulcer disease)   ? ? ?Past Surgical History:  ?Procedure Laterality Date  ? ABDOMINAL SURGERY    ? APPENDECTOMY  2005  ?  BREAST BIOPSY Bilateral 1999  ? rt w/out clip - neg, lt w/clip - neg  ? BREAST CYST ASPIRATION Right 2003  ? neg  ? CHOLECYSTECTOMY  2004  ? COLONOSCOPY WITH PROPOFOL N/A 06/09/2016  ? Procedure: COLONOSCOPY WITH PROPOFOL;  Surgeon: Jonathon Bellows, MD;  Location[facility] ENDOSCOPY;  Service: Endoscopy;  Laterality: N/A;  ? ESOPHAGOGASTRODUODENOSCOPY (EGD) WITH PROPOFOL N/A 06/09/2016  ? Procedure: ESOPHAGOGASTRODUODENOSCOPY (EGD) WITH PROPOFOL;  Surgeon: Jonathon Bellows, MD;  Location: [facility] ENDOSCOPY;  Service: Endoscopy;  Laterality: N/A;  ? EYE SURGERY    ? GIVENS CAPSULE STUDY N/A 06/23/2016  ? Procedure: GIVENS CAPSULE STUDY;  Surgeon: Jonathon Bellows, MD;  Location: [facility] ENDOSCOPY;  Service: Endoscopy;  Laterality: N/A;  ? PARS PLANA VITRECTOMY W/ REPAIR OF MACULAR HOLE Left 01/2017  ? [place]  ? TOTAL ABDOMINAL HYSTERECTOMY W/  BILATERAL SALPINGOOPHORECTOMY  1986  ? ? ?Family History  ?Problem Relation Age of Onset  ? Heart disease Mother   ?     AMI  ? Stroke Mother   ? Heart disease Father   ? Hypertension Sister   ? Breast cancer Sister   ? Hypertension Sister   ? Heart disease Sister   ?     MI  ? Diabetes Sister   ? Kidney cancer Neg Hx   ? Kidney disease Neg Hx   ? Prostate cancer Neg Hx   ? ? ?Social History  ? ?Socioeconomic History  ? Marital status: Single  ?  Spouse name: Not on file  ? Number of children: 1  ? Years of education: Not on file  ? Highest education level: Not on file  ?Occupational History  ? Occupation: DISABLED  ?Tobacco Use  ? Smoking status: Former  ?  Packs/day: 1.00  ?  Years: 40.00  ?  Pack years: 40.00  ?  Types: Cigarettes  ?  Quit date: 09/25/2013  ?  Years since quitting: 7.7  ? Smokeless tobacco: Never  ? Tobacco comments:  ?     ?Vaping Use  ? Vaping Use: Never used  ?Substance and Sexual Activity  ? Alcohol use: Not Currently  ?  Alcohol/week: 0.0 standard drinks  ?  Comment: rarely  ? Drug use: No  ? Sexual activity: Not on file  ?Other Topics Concern  ? Not on file  ?Social History Narrative  ? Living alone again  ?   ? No living will  ? Daughter should make health care decisions  ? Requests DNR---done 10/24/14  ? No tube feeds if cognitively unaware  ? Highest level of education: 9th  ? ?Social Determinants of Health  ? ?Financial Resource Strain: Not on file  ?Food Insecurity: Not on file  ?Transportation Needs: Not on file  ?Physical Activity: Not on file  ?Stress: Not on file  ?Social Connections: Not on file  ?Intimate Partner Violence: Not on file  ? ?Review of Systems ?Sleeping okay ?Appetite still not great---but not changed ?   ?Objective:  ? Physical Exam ?Constitutional:   ?   Appearance: Normal appearance.  ?Neurological:  ?   Mental Status: She is alert.  ?Psychiatric:  ?   Comments: Clearly brighter and better mood  ?  ? ? ? ? ?   ?Assessment & Plan:  ? ?

## 2021-06-30 NOTE — Assessment & Plan Note (Signed)
Chronic dysthymia now finally responded to methyphenidate ?Now seems to be tolerating 2.'5mg'$  bid ?Discussed increasing if effect wanes ?

## 2021-07-01 ENCOUNTER — Ambulatory Visit: Payer: Medicare Other | Admitting: Internal Medicine

## 2021-07-02 NOTE — Telephone Encounter (Signed)
Patient is calling about prescription methylphenidate (RITALIN) 5 MG tablet. Patient still report head pain. Patient states she feels like her head is going to pop open. Patient also reports swelling in ankles. Patient states she did soak them in rubbing alcohol and the swelling went down. Please advise?

## 2021-07-02 NOTE — Telephone Encounter (Signed)
Please advise 

## 2021-07-05 NOTE — Telephone Encounter (Signed)
Spoke to pt. She said the symptoms have cleared up and she back to taking the medication as prescribed. ?

## 2021-07-05 NOTE — Telephone Encounter (Signed)
That is great to hear---since she finally found something that helps and gives her more energy. ?

## 2021-07-19 ENCOUNTER — Other Ambulatory Visit: Payer: Self-pay | Admitting: Internal Medicine

## 2021-07-19 DIAGNOSIS — Z1231 Encounter for screening mammogram for malignant neoplasm of breast: Secondary | ICD-10-CM

## 2021-07-19 NOTE — Telephone Encounter (Signed)
Last filled 05-11-21 #90 ?Last OV 06-30-21 ?Next OV 09-29-21 ?[facility] ?

## 2021-07-21 ENCOUNTER — Ambulatory Visit (INDEPENDENT_AMBULATORY_CARE_PROVIDER_SITE_OTHER): Payer: Medicare Other | Admitting: Internal Medicine

## 2021-07-21 ENCOUNTER — Other Ambulatory Visit: Payer: Self-pay | Admitting: Internal Medicine

## 2021-07-21 ENCOUNTER — Encounter: Payer: Self-pay | Admitting: Internal Medicine

## 2021-07-21 VITALS — BP 120/66 | HR 72 | Temp 97.5°F | Ht 63.0 in | Wt 116.0 lb

## 2021-07-21 DIAGNOSIS — F39 Unspecified mood [affective] disorder: Secondary | ICD-10-CM | POA: Diagnosis not present

## 2021-07-21 DIAGNOSIS — L989 Disorder of the skin and subcutaneous tissue, unspecified: Secondary | ICD-10-CM | POA: Diagnosis not present

## 2021-07-21 MED ORDER — METHYLPHENIDATE HCL 10 MG PO TABS: 10.0000 mg | ORAL_TABLET | Freq: Two times a day (BID) | ORAL | 0 refills | Status: DC

## 2021-07-21 NOTE — Assessment & Plan Note (Signed)
Suspicious lesion ?Will refer to derm ?

## 2021-07-21 NOTE — Telephone Encounter (Signed)
Caller Name: Noni Stonesifer ?Call back phone #: [phone] ? ?MEDICATION(S): methylphenidate (RITALIN) 5 MG tablet ? ? ?Days of Med Remaining: 3 days  ? ?Has the patient contacted their pharmacy (YES/NO)?  No, she wasn't sure if Dr Silvio Pate wanted her to continue it since he was trying her out on it ?IF YES, when and what did the pharmacy advise?  ?IF NO, request that the patient contact the pharmacy for the refills in the future.  ?           The pharmacy will send an electronic request (except for controlled medications). ? ?[facility] [address] [place], [place] [address] ? ? ?~~~Please advise patient/caregiver to allow 2-3 business days to process RX refills.  ?

## 2021-07-21 NOTE — Assessment & Plan Note (Signed)
Much more energy on the ritalin ?Getting out of bed, etc ?Will try increasing to 10 bid ?

## 2021-07-21 NOTE — Telephone Encounter (Signed)
Last written 06-22-21 ?Last OV 06-30-21 ?Next OV 09-29-21 ?[facility] ?

## 2021-07-21 NOTE — Progress Notes (Signed)
? ?Subjective:  ? ? Patient ID: Lindsey Ward, female    DOB: 1951/01/16, 71 y.o.   MRN: [id number] ? ?HPI ?Here due to several concerns ? ?Has been activated on the methylphenidate ?Actually went up to 5 tid (thought she was supposed to) ?Still some depression ?Wonders about a higher dose ? ?Concerned about memory ?Mostly recall stuff ?Discussed that the depression can be causing this ? ?Has several skin lesions ?Needs to reestablish with a dermatologist ? ?She has sense of heart beating hard at times ?This goes way back ?No chest pain ?No SOB ? ?Concerned about pain in back ?"Catches" around CVA areas ?Will have some urgency--then not much stream (mostly in day) ?Nocturia x 2 ?No incontinence ?Notices a smell at times ?She does drink a lot of water ? ?Current Outpatient Medications on File Prior to Visit  ?Medication Sig Dispense Refill  ? ALPRAZolam (XANAX) 1 MG tablet TAKE 1 TABLET BY MOUTH 3 TIMES DAILY AS NEEDED. 90 tablet 0  ? HYDROcodone-acetaminophen (NORCO/VICODIN) 5-325 MG tablet Take 1 tablet by mouth 3 (three) times daily as needed. Dx Code M79.7 90 tablet 0  ? ketoconazole (NIZORAL) 2 % cream Apply 1 application topically 2 (two) times daily as needed for irritation. 60 g 1  ? omeprazole (PRILOSEC) 20 MG capsule TAKE 1 CAPSULE BY MOUTH EVERY DAY 90 capsule 3  ? Polyethyl Glycol-Propyl Glycol (SYSTANE OP) Apply to eye.    ? promethazine (PHENERGAN) 12.5 MG tablet Take 1-2 tablets (12.5-25 mg total) by mouth 3 (three) times daily as needed for nausea or vomiting. 60 tablet 0  ? SUMAtriptan (IMITREX) 50 MG tablet TAKE 0.5-1 TABLET BY MOUTH DAILY AS NEEDED FOR MIGRAINE. 9 tablet 11  ? triamcinolone cream (KENALOG) 0.1 % APPLY 1 APPLICATION TOPICALLY 2 (TWO) TIMES DAILY AS NEEDED. 45 g 1  ? ?No current facility-administered medications on file prior to visit.  ? ? ?Allergies  ?Allergen Reactions  ? Tizanidine Other (See Comments)  ?  "throws me in a different world"  ? Aspirin   ?  REACTION: GI bleed  ?  Buspirone Hcl   ?  REACTION: unspecified  ? Ciprofloxacin   ?  REACTION: unspecified  ? Citalopram Other (See Comments)  ?  Ear ringing.   ? Codeine   ? Diazepam   ? Duloxetine   ?  REACTION: sz like activity  ? Ibuprofen   ?  REACTION: GI bleed  ? Imipramine Hcl Other (See Comments)  ?  Ear ringing.   ? Oxycodone-Acetaminophen   ?  REACTION: unspecified  ? Pregabalin   ?  REACTION: kept her up and confusion  ? Propoxyphene Hcl   ? Risperidone   ?  REACTION: confusion  ? Sulfonamide Derivatives   ?  REACTION: unspecified  ? Tramadol Hcl   ?  REACTION: doesn't remember what happened but couldn't tolerate  ? Tramadol Hcl Other (See Comments)  ?  REACTION: doesn't remember what happened but couldn't tolerate ?REACTION: doesn't remember what happened but couldn't tolerate ?REACTION: doesn't remember what happened but couldn't tolerate ?  ? Venlafaxine   ?  REACTION: unspecified  ? Methocarbamol Rash  ? ? ?Past Medical History:  ?Diagnosis Date  ? Bowel obstruction ([place])   ? Chronic fatigue   ? Depression   ? Fibromyalgia   ? GERD (gastroesophageal reflux disease)   ? Hyperlipemia   ? Migraine   ? Obstipation   ? Osteopenia   ? PUD (peptic ulcer disease)   ? ? ?  Past Surgical History:  ?Procedure Laterality Date  ? ABDOMINAL SURGERY    ? APPENDECTOMY  2005  ? BREAST BIOPSY Bilateral 1999  ? rt w/out clip - neg, lt w/clip - neg  ? BREAST CYST ASPIRATION Right 2003  ? neg  ? CHOLECYSTECTOMY  2004  ? COLONOSCOPY WITH PROPOFOL N/A 06/09/2016  ? Procedure: COLONOSCOPY WITH PROPOFOL;  Surgeon: Jonathon Bellows, MD;  Location[facility] ENDOSCOPY;  Service: Endoscopy;  Laterality: N/A;  ? ESOPHAGOGASTRODUODENOSCOPY (EGD) WITH PROPOFOL N/A 06/09/2016  ? Procedure: ESOPHAGOGASTRODUODENOSCOPY (EGD) WITH PROPOFOL;  Surgeon: Jonathon Bellows, MD;  Location: [facility] ENDOSCOPY;  Service: Endoscopy;  Laterality: N/A;  ? EYE SURGERY    ? GIVENS CAPSULE STUDY N/A 06/23/2016  ? Procedure: GIVENS CAPSULE STUDY;  Surgeon: Jonathon Bellows, MD;  Location: [facility] ENDOSCOPY;   Service: Endoscopy;  Laterality: N/A;  ? PARS PLANA VITRECTOMY W/ REPAIR OF MACULAR HOLE Left 01/2017  ? [place]  ? TOTAL ABDOMINAL HYSTERECTOMY W/ BILATERAL SALPINGOOPHORECTOMY  1986  ? ? ?Family History  ?Problem Relation Age of Onset  ? Heart disease Mother   ?     AMI  ? Stroke Mother   ? Heart disease Father   ? Hypertension Sister   ? Breast cancer Sister   ? Hypertension Sister   ? Heart disease Sister   ?     MI  ? Diabetes Sister   ? Kidney cancer Neg Hx   ? Kidney disease Neg Hx   ? Prostate cancer Neg Hx   ? ? ?Social History  ? ?Socioeconomic History  ? Marital status: Single  ?  Spouse name: Not on file  ? Number of children: 1  ? Years of education: Not on file  ? Highest education level: Not on file  ?Occupational History  ? Occupation: DISABLED  ?Tobacco Use  ? Smoking status: Former  ?  Packs/day: 1.00  ?  Years: 40.00  ?  Pack years: 40.00  ?  Types: Cigarettes  ?  Quit date: 09/25/2013  ?  Years since quitting: 7.8  ? Smokeless tobacco: Never  ? Tobacco comments:  ?     ?Vaping Use  ? Vaping Use: Never used  ?Substance and Sexual Activity  ? Alcohol use: Not Currently  ?  Alcohol/week: 0.0 standard drinks  ?  Comment: rarely  ? Drug use: No  ? Sexual activity: Not on file  ?Other Topics Concern  ? Not on file  ?Social History Narrative  ? Living alone again  ?   ? No living will  ? Daughter should make health care decisions  ? Requests DNR---done 10/24/14  ? No tube feeds if cognitively unaware  ? Highest level of education: 9th  ? ?Social Determinants of Health  ? ?Financial Resource Strain: Not on file  ?Food Insecurity: Not on file  ?Transportation Needs: Not on file  ?Physical Activity: Not on file  ?Stress: Not on file  ?Social Connections: Not on file  ?Intimate Partner Violence: Not on file  ? ?Review of Systems ?Chronic buzzing ?Sore areas in neck also ?   ?Objective:  ? Physical Exam ?Constitutional:   ?   Appearance: Normal appearance.  ?Neck:  ?   Comments: Decreased flexion but no mass or  nodes ?Skin: ?   Comments: Suspicious red papule left anterior thigh  ?Neurological:  ?   Mental Status: She is alert.  ?  ? ? ? ? ?   ?Assessment & Plan:  ? ?

## 2021-08-05 ENCOUNTER — Ambulatory Visit (INDEPENDENT_AMBULATORY_CARE_PROVIDER_SITE_OTHER)
Admission: RE | Admit: 2021-08-05 | Discharge: 2021-08-05 | Disposition: A | Discharge status: Home / Self Care | Payer: Medicare Other | Source: Ambulatory Visit | Attending: Internal Medicine | Admitting: Internal Medicine

## 2021-08-05 ENCOUNTER — Encounter: Payer: Self-pay | Admitting: Internal Medicine

## 2021-08-05 ENCOUNTER — Ambulatory Visit (INDEPENDENT_AMBULATORY_CARE_PROVIDER_SITE_OTHER): Payer: Medicare Other | Admitting: Internal Medicine

## 2021-08-05 VITALS — BP 132/78 | HR 72 | Temp 97.1°F | Ht 63.0 in | Wt 116.0 lb

## 2021-08-05 DIAGNOSIS — Z9889 Other specified postprocedural states: Secondary | ICD-10-CM | POA: Diagnosis not present

## 2021-08-05 DIAGNOSIS — M79671 Pain in right foot: Secondary | ICD-10-CM

## 2021-08-05 DIAGNOSIS — G8929 Other chronic pain: Secondary | ICD-10-CM | POA: Insufficient documentation

## 2021-08-05 DIAGNOSIS — S99921A Unspecified injury of right foot, initial encounter: Secondary | ICD-10-CM | POA: Diagnosis not present

## 2021-08-05 NOTE — Assessment & Plan Note (Addendum)
Exam not overly worrisome---but unsure about the 5th metatarsal Will check x-ray X-ray is reassuring---could be ongoing pain due to past surgery Reassured her--discussed symptom relief

## 2021-08-05 NOTE — Progress Notes (Signed)
Subjective:    Patient ID: Lindsey Ward, female    DOB: 03/31/1950, 71 y.o.   MRN: [id number]  HPI Here due to right foot pain  Got up to answer phone--from chair (4 days ago) Left foot hit right foot Tripped and fell--right shoe came off  Was able to get to phone by crawling Did pull herself up---lots of right foot pain though Got some bruising across forefoot--and swelling  Just not getting better Did ice at first---also tried OTC salve  Tried 1/2 hydrocodone--did help  Current Outpatient Medications on File Prior to Visit  Medication Sig Dispense Refill   ALPRAZolam (XANAX) 1 MG tablet TAKE 1 TABLET BY MOUTH 3 TIMES DAILY AS NEEDED. 90 tablet 0   HYDROcodone-acetaminophen (NORCO/VICODIN) 5-325 MG tablet Take 1 tablet by mouth 3 (three) times daily as needed. Dx Code M79.7 90 tablet 0   ketoconazole (NIZORAL) 2 % cream Apply 1 application topically 2 (two) times daily as needed for irritation. 60 g 1   methylphenidate (RITALIN) 10 MG tablet Take 1 tablet (10 mg total) by mouth 2 (two) times daily. 60 tablet 0   omeprazole (PRILOSEC) 20 MG capsule TAKE 1 CAPSULE BY MOUTH EVERY DAY 90 capsule 3   Polyethyl Glycol-Propyl Glycol (SYSTANE OP) Apply to eye.     promethazine (PHENERGAN) 12.5 MG tablet Take 1-2 tablets (12.5-25 mg total) by mouth 3 (three) times daily as needed for nausea or vomiting. 60 tablet 0   SUMAtriptan (IMITREX) 50 MG tablet TAKE 0.5-1 TABLET BY MOUTH DAILY AS NEEDED FOR MIGRAINE. 9 tablet 11   triamcinolone cream (KENALOG) 0.1 % APPLY 1 APPLICATION TOPICALLY 2 (TWO) TIMES DAILY AS NEEDED. 45 g 1   No current facility-administered medications on file prior to visit.    Allergies  Allergen Reactions   Tizanidine Other (See Comments)    "throws me in a different world"   Aspirin     REACTION: GI bleed   Buspirone Hcl     REACTION: unspecified   Ciprofloxacin     REACTION: unspecified   Citalopram Other (See Comments)    Ear ringing.    Codeine     Diazepam    Duloxetine     REACTION: sz like activity   Ibuprofen     REACTION: GI bleed   Imipramine Hcl Other (See Comments)    Ear ringing.    Oxycodone-Acetaminophen     REACTION: unspecified   Pregabalin     REACTION: kept her up and confusion   Propoxyphene Hcl    Risperidone     REACTION: confusion   Sulfonamide Derivatives     REACTION: unspecified   Tramadol Hcl     REACTION: doesn't remember what happened but couldn't tolerate   Tramadol Hcl Other (See Comments)    REACTION: doesn't remember what happened but couldn't tolerate REACTION: doesn't remember what happened but couldn't tolerate REACTION: doesn't remember what happened but couldn't tolerate    Venlafaxine     REACTION: unspecified   Methocarbamol Rash    Past Medical History:  Diagnosis Date   Bowel obstruction (HCC)    Chronic fatigue    Depression    Fibromyalgia    GERD (gastroesophageal reflux disease)    Hyperlipemia    Migraine    Obstipation    Osteopenia    PUD (peptic ulcer disease)     Past Surgical History:  Procedure Laterality Date   ABDOMINAL SURGERY     APPENDECTOMY  2005   BREAST  BIOPSY Bilateral 1999   rt w/out clip - neg, lt w/clip - neg   BREAST CYST ASPIRATION Right 2003   neg   CHOLECYSTECTOMY  2004   COLONOSCOPY WITH PROPOFOL N/A 06/09/2016   Procedure: COLONOSCOPY WITH PROPOFOL;  Surgeon: Jonathon Bellows, MD;  Location: [facility] ENDOSCOPY;  Service: Endoscopy;  Laterality: N/A;   ESOPHAGOGASTRODUODENOSCOPY (EGD) WITH PROPOFOL N/A 06/09/2016   Procedure: ESOPHAGOGASTRODUODENOSCOPY (EGD) WITH PROPOFOL;  Surgeon: Jonathon Bellows, MD;  Location: [facility] ENDOSCOPY;  Service: Endoscopy;  Laterality: N/A;   EYE SURGERY     GIVENS CAPSULE STUDY N/A 06/23/2016   Procedure: GIVENS CAPSULE STUDY;  Surgeon: Jonathon Bellows, MD;  Location: [facility] ENDOSCOPY;  Service: Endoscopy;  Laterality: N/A;   PARS PLANA VITRECTOMY W/ REPAIR OF MACULAR HOLE Left 01/2017   [facility]   TOTAL ABDOMINAL HYSTERECTOMY W/  BILATERAL SALPINGOOPHORECTOMY  1986    Family History  Problem Relation Age of Onset   Heart disease Mother        AMI   Stroke Mother    Heart disease Father    Hypertension Sister    Breast cancer Sister    Hypertension Sister    Heart disease Sister        MI   Diabetes Sister    Kidney cancer Neg Hx    Kidney disease Neg Hx    Prostate cancer Neg Hx     Social History   Socioeconomic History   Marital status: Single    Spouse name: Not on file   Number of children: 1   Years of education: Not on file   Highest education level: Not on file  Occupational History   Occupation: DISABLED  Tobacco Use   Smoking status: Former    Packs/day: 1.00    Years: 40.00    Pack years: 40.00    Types: Cigarettes    Quit date: 09/25/2013    Years since quitting: 7.8   Smokeless tobacco: Never   Tobacco comments:       Vaping Use   Vaping Use: Never used  Substance and Sexual Activity   Alcohol use: Not Currently    Alcohol/week: 0.0 standard drinks    Comment: rarely   Drug use: No   Sexual activity: Not on file  Other Topics Concern   Not on file  Social History Narrative   Living alone again      No living will   Daughter should make health care decisions   Requests DNR---done 10/24/14   No tube feeds if cognitively unaware   Highest level of education: 9th   Social Determinants of Health   Financial Resource Strain: Not on file  Food Insecurity: Not on file  Transportation Needs: Not on file  Physical Activity: Not on file  Stress: Not on file  Social Connections: Not on file  Intimate Partner Violence: Not on file   Review of Systems Walks on foot--but painful No sick--no fevers Some right low back pain---seems muscular based on description (discussed lidocaine patch)    Objective:   Physical Exam Constitutional:      Appearance: Normal appearance.  Musculoskeletal:     Comments: Is able to bear weight Tenderness along proximal end of 5th  metatarsal No toe tenderness Ankle fine  Neurological:     Mental Status: She is alert.           Assessment & Plan:

## 2021-08-12 ENCOUNTER — Ambulatory Visit
Admission: RE | Admit: 2021-08-12 | Discharge: 2021-08-12 | Disposition: A | Discharge status: Home / Self Care | Payer: Medicare Other | Source: Ambulatory Visit | Attending: Internal Medicine | Admitting: Internal Medicine

## 2021-08-12 ENCOUNTER — Telehealth: Payer: Self-pay | Admitting: Internal Medicine

## 2021-08-12 DIAGNOSIS — Z1231 Encounter for screening mammogram for malignant neoplasm of breast: Secondary | ICD-10-CM | POA: Diagnosis not present

## 2021-08-12 NOTE — Telephone Encounter (Signed)
Pt called and asked what she can do about her right foot, she didn't know if she needs to wrap it or what else might help. She was seen 08/05/21. She said its still been swollen. Call back is [phone]

## 2021-08-12 NOTE — Telephone Encounter (Signed)
Left detailed message on VM per DPR. 

## 2021-08-18 ENCOUNTER — Other Ambulatory Visit: Payer: Self-pay

## 2021-08-18 NOTE — Telephone Encounter (Signed)
Name of Medication: methylphenidate 10 mg Name of Pharmacy: [place] [facility] or Written Date and Quantity: # 34 on 07/21/2021 Last Office Visit and Type: 07/21/21 Next Office Visit and Type: 09/29/21 3 mth FU Last Controlled Substance Agreement Date: none seen Last UDS:04/16/2020

## 2021-08-18 NOTE — Telephone Encounter (Signed)
MEDICATION: methylphenidate (RITALIN) 10 MG tablet  PHARMACY: [name] [place] [facility] [address] Comments: Patient has 3 days left.   **Let patient know to contact pharmacy at the end of the day to make sure medication is ready. **  ** Please notify patient to allow 48-72 hours to process**  **Encourage patient to contact the pharmacy for refills or they can request refills through [facility]*

## 2021-08-19 ENCOUNTER — Other Ambulatory Visit: Payer: Self-pay | Admitting: Family

## 2021-08-19 MED ORDER — METHYLPHENIDATE HCL 10 MG PO TABS: 10.0000 mg | ORAL_TABLET | Freq: Two times a day (BID) | ORAL | 0 refills | Status: DC

## 2021-08-19 NOTE — Telephone Encounter (Signed)
Per med list Dutch Quint FNP has already refilled the methylphenidate.

## 2021-09-01 DIAGNOSIS — M48062 Spinal stenosis, lumbar region with neurogenic claudication: Secondary | ICD-10-CM | POA: Diagnosis not present

## 2021-09-01 DIAGNOSIS — M5416 Radiculopathy, lumbar region: Secondary | ICD-10-CM | POA: Diagnosis not present

## 2021-09-06 ENCOUNTER — Telehealth: Payer: Self-pay

## 2021-09-22 ENCOUNTER — Other Ambulatory Visit: Payer: Self-pay | Admitting: Internal Medicine

## 2021-09-22 MED ORDER — METHYLPHENIDATE HCL 10 MG PO TABS: 10.0000 mg | ORAL_TABLET | Freq: Two times a day (BID) | ORAL | 0 refills | Status: DC

## 2021-09-22 NOTE — Telephone Encounter (Signed)
Encourage patient to contact the pharmacy for refills or they can request refills through [facility]  Did the patient contact the pharmacy:  No  NEXT APPOINTMENT DATE: 7.19.2023  MEDICATION: methylphenidate (RITALIN) 10 MG tablet  Is the patient out of medication? Has enough for another day or so  If not, how much is left? N/A  Is this a 90 day supply: N/A  [name] [place]: [phone] Let patient know to contact pharmacy at the end of the day to make sure medication is ready. Please notify patient to allow 48-72 hours to process

## 2021-09-22 NOTE — Telephone Encounter (Signed)
Last written 08-19-21 #60 Last OV 08-05-21 Next OV 09-29-21 [facility]

## 2021-09-22 NOTE — Telephone Encounter (Signed)
Last filled 07-19-21 #90 Last OV 08-05-21 Next OV 09-29-21 [facility]

## 2021-09-24 ENCOUNTER — Telehealth: Payer: Self-pay

## 2021-09-24 DIAGNOSIS — M5412 Radiculopathy, cervical region: Secondary | ICD-10-CM | POA: Diagnosis not present

## 2021-09-24 DIAGNOSIS — M5416 Radiculopathy, lumbar region: Secondary | ICD-10-CM | POA: Diagnosis not present

## 2021-09-24 DIAGNOSIS — M48062 Spinal stenosis, lumbar region with neurogenic claudication: Secondary | ICD-10-CM | POA: Diagnosis not present

## 2021-09-24 DIAGNOSIS — M5030 Other cervical disc degeneration, unspecified cervical region: Secondary | ICD-10-CM | POA: Diagnosis not present

## 2021-09-24 DIAGNOSIS — M5136 Other intervertebral disc degeneration, lumbar region: Secondary | ICD-10-CM | POA: Diagnosis not present

## 2021-09-24 NOTE — Telephone Encounter (Signed)
Received fax request thru [address] for Sumatriptan 50 mg from [facility]. I spoke with Elta Guadeloupe at [employer] and he said request was for new rx with qty of # 19. Elta Guadeloupe said would not be appropriate and to disregard request. Pt picked up # 9 on 09/22/21 and pt has # 53 left. Nothing further needed.

## 2021-09-28 ENCOUNTER — Telehealth: Payer: Self-pay | Admitting: Internal Medicine

## 2021-09-28 NOTE — Telephone Encounter (Signed)
I spoke to pt and advised her that would be a question for the study group. She will ask them. She wondered if it is a good thing for her to do. I advised it cannot be a bad thing. Hopefully she finds some relief.

## 2021-09-28 NOTE — Telephone Encounter (Signed)
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

## 2021-09-29 ENCOUNTER — Ambulatory Visit (INDEPENDENT_AMBULATORY_CARE_PROVIDER_SITE_OTHER): Payer: Medicare Other | Admitting: Internal Medicine

## 2021-09-29 ENCOUNTER — Encounter: Payer: Self-pay | Admitting: Internal Medicine

## 2021-09-29 DIAGNOSIS — M5441 Lumbago with sciatica, right side: Secondary | ICD-10-CM | POA: Diagnosis not present

## 2021-09-29 DIAGNOSIS — F39 Unspecified mood [affective] disorder: Secondary | ICD-10-CM

## 2021-09-29 DIAGNOSIS — G8929 Other chronic pain: Secondary | ICD-10-CM | POA: Diagnosis not present

## 2021-09-29 NOTE — Assessment & Plan Note (Signed)
Depression and anhedonia are gone with the methylphenidate 10 bid Having sleep problems though Asked her to take the second dose earlier (noon). If that doesn't work, can cut the dose to '5mg'$  (or even stop if necessary)

## 2021-09-29 NOTE — Progress Notes (Signed)
Subjective:    Patient ID: Lindsey Ward, female    DOB: 1950/08/19, 71 y.o.   MRN: [id number]  HPI Here for follow up of mood issues  Has gone to be considered for a study about fibromyalgia (and cognition?) at [place] a MRI yesterday and some blood work Hard to get there  Ritalin still effective for her energy levels Not sleeping well now--thinks it is related to the afternoon dose Still getting out---not staying in bed, etc  Still uses the hydrocodone once in a while Only using twice a week or so  Current Outpatient Medications on File Prior to Visit  Medication Sig Dispense Refill   ALPRAZolam (XANAX) 1 MG tablet TAKE 1 TABLET BY MOUTH THREE TIMES A DAY AS NEEDED 90 tablet 0   HYDROcodone-acetaminophen (NORCO/VICODIN) 5-325 MG tablet Take 1 tablet by mouth 3 (three) times daily as needed. Dx Code M79.7 90 tablet 0   ketoconazole (NIZORAL) 2 % cream Apply 1 application topically 2 (two) times daily as needed for irritation. 60 g 1   methylphenidate (RITALIN) 10 MG tablet Take 1 tablet (10 mg total) by mouth 2 (two) times daily. 60 tablet 0   omeprazole (PRILOSEC) 20 MG capsule TAKE 1 CAPSULE BY MOUTH EVERY DAY 90 capsule 3   Polyethyl Glycol-Propyl Glycol (SYSTANE OP) Apply to eye.     promethazine (PHENERGAN) 12.5 MG tablet Take 1-2 tablets (12.5-25 mg total) by mouth 3 (three) times daily as needed for nausea or vomiting. 60 tablet 0   SUMAtriptan (IMITREX) 50 MG tablet TAKE 0.5-1 TABLET BY MOUTH DAILY AS NEEDED FOR MIGRAINE. 9 tablet 11   triamcinolone cream (KENALOG) 0.1 % APPLY 1 APPLICATION TOPICALLY 2 (TWO) TIMES DAILY AS NEEDED. 45 g 1   No current facility-administered medications on file prior to visit.    Allergies  Allergen Reactions   Tizanidine Other (See Comments)    "throws me in a different world"   Aspirin     REACTION: GI bleed   Buspirone Hcl     REACTION: unspecified   Ciprofloxacin     REACTION: unspecified   Citalopram Other (See Comments)     Ear ringing.    Codeine    Diazepam    Duloxetine     REACTION: sz like activity   Ibuprofen     REACTION: GI bleed   Imipramine Hcl Other (See Comments)    Ear ringing.    Oxycodone-Acetaminophen     REACTION: unspecified   Pregabalin     REACTION: kept her up and confusion   Propoxyphene Hcl    Risperidone     REACTION: confusion   Sulfonamide Derivatives     REACTION: unspecified   Tramadol Hcl     REACTION: doesn't remember what happened but couldn't tolerate   Tramadol Hcl Other (See Comments)    REACTION: doesn't remember what happened but couldn't tolerate REACTION: doesn't remember what happened but couldn't tolerate REACTION: doesn't remember what happened but couldn't tolerate    Venlafaxine     REACTION: unspecified   Methocarbamol Rash    Past Medical History:  Diagnosis Date   Bowel obstruction (HCC)    Chronic fatigue    Depression    Fibromyalgia    GERD (gastroesophageal reflux disease)    Hyperlipemia    Migraine    Obstipation    Osteopenia    PUD (peptic ulcer disease)     Past Surgical History:  Procedure Laterality Date   ABDOMINAL SURGERY  APPENDECTOMY  2005   BREAST BIOPSY Bilateral 1999   rt w/out clip - neg, lt w/clip - neg   BREAST CYST ASPIRATION Right 2003   neg   CHOLECYSTECTOMY  2004   COLONOSCOPY WITH PROPOFOL N/A 06/09/2016   Procedure: COLONOSCOPY WITH PROPOFOL;  Surgeon: Jonathon Bellows, MD;  Location: [facility] ENDOSCOPY;  Service: Endoscopy;  Laterality: N/A;   ESOPHAGOGASTRODUODENOSCOPY (EGD) WITH PROPOFOL N/A 06/09/2016   Procedure: ESOPHAGOGASTRODUODENOSCOPY (EGD) WITH PROPOFOL;  Surgeon: Jonathon Bellows, MD;  Location: [facility] ENDOSCOPY;  Service: Endoscopy;  Laterality: N/A;   EYE SURGERY     GIVENS CAPSULE STUDY N/A 06/23/2016   Procedure: GIVENS CAPSULE STUDY;  Surgeon: Jonathon Bellows, MD;  Location: [facility] ENDOSCOPY;  Service: Endoscopy;  Laterality: N/A;   PARS PLANA VITRECTOMY W/ REPAIR OF MACULAR HOLE Left 01/2017   [facility]   TOTAL  ABDOMINAL HYSTERECTOMY W/ BILATERAL SALPINGOOPHORECTOMY  1986    Family History  Problem Relation Age of Onset   Heart disease Mother        AMI   Stroke Mother    Heart disease Father    Hypertension Sister    Breast cancer Sister    Hypertension Sister    Heart disease Sister        MI   Diabetes Sister    Kidney cancer Neg Hx    Kidney disease Neg Hx    Prostate cancer Neg Hx     Social History   Socioeconomic History   Marital status: Single    Spouse name: Not on file   Number of children: 1   Years of education: Not on file   Highest education level: Not on file  Occupational History   Occupation: DISABLED  Tobacco Use   Smoking status: Former    Packs/day: 1.00    Years: 40.00    Total pack years: 40.00    Types: Cigarettes    Quit date: 09/25/2013    Years since quitting: 8.0   Smokeless tobacco: Never   Tobacco comments:       Vaping Use   Vaping Use: Never used  Substance and Sexual Activity   Alcohol use: Not Currently    Alcohol/week: 0.0 standard drinks of alcohol    Comment: rarely   Drug use: No   Sexual activity: Not on file  Other Topics Concern   Not on file  Social History Narrative   Living alone again      No living will   Daughter should make health care decisions   Requests DNR---done 10/24/14   No tube feeds if cognitively unaware   Highest level of education: 9th   Social Determinants of Health   Financial Resource Strain: Not on file  Food Insecurity: Not on file  Transportation Needs: Not on file  Physical Activity: Not on file  Stress: Not on file  Social Connections: Not on file  Intimate Partner Violence: Not on file   Review of Systems Bowels are slow--uses miralax at times Goes once a week mostly     Objective:   Physical Exam Constitutional:      Appearance: Normal appearance.  Neurological:     Mental Status: She is alert.  Psychiatric:        Mood and Affect: Mood normal.        Behavior: Behavior  normal.     Comments: Energetic and up beat            Assessment & Plan:

## 2021-09-29 NOTE — Assessment & Plan Note (Signed)
Less pain Only using the hydrocodone once in a while PDMP reviewed

## 2021-09-29 NOTE — Patient Instructions (Signed)
Please change the second methylphenidate dose to around noon. If you have ongoing sleep problems, you can either stop it (and just take the morning dose) or try cutting it in half and only take '5mg'$  at noon.

## 2021-10-08 ENCOUNTER — Other Ambulatory Visit: Payer: Self-pay

## 2021-10-08 ENCOUNTER — Emergency Department: Payer: Medicare Other

## 2021-10-08 ENCOUNTER — Telehealth: Payer: Self-pay | Admitting: Internal Medicine

## 2021-10-08 ENCOUNTER — Emergency Department
Admission: EM | Admit: 2021-10-08 | Discharge: 2021-10-08 | Disposition: A | Discharge status: Home / Self Care | Payer: Medicare Other | Attending: Emergency Medicine | Admitting: Emergency Medicine

## 2021-10-08 DIAGNOSIS — R079 Chest pain, unspecified: Secondary | ICD-10-CM | POA: Insufficient documentation

## 2021-10-08 DIAGNOSIS — R002 Palpitations: Secondary | ICD-10-CM | POA: Diagnosis not present

## 2021-10-08 DIAGNOSIS — R0602 Shortness of breath: Secondary | ICD-10-CM | POA: Diagnosis not present

## 2021-10-08 DIAGNOSIS — I739 Peripheral vascular disease, unspecified: Secondary | ICD-10-CM | POA: Diagnosis not present

## 2021-10-08 LAB — COMPREHENSIVE METABOLIC PANEL
ALT: 22 U/L (ref 0–44)
AST: 33 U/L (ref 15–41)
Albumin: 4.2 g/dL (ref 3.5–5.0)
Alkaline Phosphatase: 96 U/L (ref 38–126)
Anion gap: 6 (ref 5–15)
BUN: 9 mg/dL (ref 8–23)
CO2: 28 mmol/L (ref 22–32)
Calcium: 9.4 mg/dL (ref 8.9–10.3)
Chloride: 106 mmol/L (ref 98–111)
Creatinine, Ser: 0.67 mg/dL (ref 0.44–1.00)
GFR, Estimated: >60 mL/min (ref >60)
Glucose, Bld: 105 mg/dL — ABNORMAL HIGH (ref 70–99)
Potassium: 3.5 mmol/L (ref 3.5–5.1)
Sodium: 140 mmol/L (ref 135–145)
Total Bilirubin: 0.5 mg/dL (ref 0.3–1.2)
Total Protein: 7.3 g/dL (ref 6.5–8.1)

## 2021-10-08 LAB — CBC WITH DIFFERENTIAL/PLATELET
Abs Immature Granulocytes: 0.01 10*3/uL (ref 0.00–0.07)
Basophils Absolute: 0.1 10*3/uL (ref 0.0–0.1)
Basophils Relative: 1 %
Eosinophils Absolute: 0 10*3/uL (ref 0.0–0.5)
Eosinophils Relative: 1 %
HCT: 38.3 % (ref 36.0–46.0)
Hemoglobin: 12.6 g/dL (ref 12.0–15.0)
Immature Granulocytes: 0 %
Lymphocytes Relative: 19 %
Lymphs Abs: 1.2 10*3/uL (ref 0.7–4.0)
MCH: 29.9 pg (ref 26.0–34.0)
MCHC: 32.9 g/dL (ref 30.0–36.0)
MCV: 91 fL (ref 80.0–100.0)
Monocytes Absolute: 0.4 10*3/uL (ref 0.1–1.0)
Monocytes Relative: 7 %
Neutro Abs: 4.6 10*3/uL (ref 1.7–7.7)
Neutrophils Relative %: 72 %
Platelets: 309 10*3/uL (ref 150–400)
RBC: 4.21 MIL/uL (ref 3.87–5.11)
RDW: 12.5 % (ref 11.5–15.5)
WBC: 6.3 10*3/uL (ref 4.0–10.5)
nRBC: 0 % (ref 0.0–0.2)

## 2021-10-08 LAB — TROPONIN I (HIGH SENSITIVITY): Troponin I (High Sensitivity): 5 ng/L (ref <18)

## 2021-10-08 NOTE — Telephone Encounter (Signed)
Patient went to urgent care but they said there wasn't much they could do, they said she probably needs a ultrasound and a cardiologist. Lindsey Ward is [phone]

## 2021-10-08 NOTE — Discharge Instructions (Addendum)
Please keep your cardiology appointment next week to discuss your palpitations.  Your circulation to your legs based on your exam is good.  You can discuss the results of your testing at your cardiology appointment.

## 2021-10-08 NOTE — Telephone Encounter (Signed)
[facility] [facility] - Client TELEPHONE ADVICE RECORD Access[facility] Patient Name: Lindsey Ward Gender: Female DOB: [birth year] Age: 71 Y 56 M 13 D Return Phone Number: [phone] (Primary) Address: City/ State/ Zip: [place]  [address] Client [facility] - Client Client Site [place] - Day Provider Lindsey Ward- MD Contact Type Call Who Is Calling Patient / Member / Family / Caregiver Call Type Triage / Clinical Relationship To Patient Self Return Phone Number [phone] (Primary) Chief Complaint Leg Pain Reason for Call Symptomatic / Request for [name] says that she had an MRI recently that showed some blockage in her leg and she is concerned. She has pain and a big bruise on her rt her leg. PT says she also had blood work that show concerns. One side says 1.29, and the other 0.15 which is "severe". NOTE: Per office they don't have PT results yet. [place] Not Listed Urgent care Translation No Nurse Assessment Nurse: Hardin Negus, RN, Mardene Celeste Date/Time [name] Time): 10/08/2021 8:42:39 AM Confirm and document reason for call. If symptomatic, describe symptoms. ---She had an MRI recently that showed some blockage in her leg and she is concerned. She has pain and a big bruise on her rt her leg. PT says she also had blood work that show concerns. One side says 1.29, and the other 0.15 which is "severe". She continues to have pain and tingling.. She has some bruising in the inside of thigh of her right leg. She noticed one day this day. It is bigger today. She can walk on the leg. Does the patient have any new or worsening symptoms? ---Yes Will a triage be completed? ---Yes Related visit to physician within the last 2 weeks? ---Yes Does the PT have any chronic conditions? (i.e. diabetes, asthma, this includes High risk factors for pregnancy,  etc.) ---Yes List chronic conditions. ---fibromyalgia Is this a behavioral health or substance abuse call? ---No PLEASE NOTE: All timestamps contained within this report are represented as [place] Standard Time. CONFIDENTIALTY NOTICE: This fax transmission is intended only for the addressee. It contains information that is legally privileged, confidential or otherwise protected from use or disclosure. If you are not the intended recipient, you are strictly prohibited from reviewing, disclosing, copying using or disseminating any of this information or taking any action in reliance on or regarding this information. If you have received this fax in error, please notify us immediately by telephone so that we can arrange for its return to [place]. Phone: [phone], Toll-Free: [phone], Fax: [phone] Page: 2 of 2 Call Id: [id number] Guidelines Guideline Title Affirmed Question Affirmed Notes Nurse Date/Time [name] Time) Leg Pain Illness requiring prolonged bedrest in past month (e.g., immobilization, long hospital stay) Hardin Negus, RN, Mardene Celeste 10/08/2021 8:44:47 AM Disp. Time [name] Time) Disposition Final User 10/08/2021 8:50:38 AM See HCP within 4 Hours (or PCP triage) Yes Hardin Negus, RN, Mardene Celeste Final Disposition 10/08/2021 8:50:38 AM See HCP within 4 Hours (or PCP triage) Yes Hardin Negus, RN, Len[place] Ponds Disagree/Comply Comply Caller Understands Yes PreDisposition Call Doctor Care Advice Given Per Guideline SEE HCP (OR PCP TRIAGE) WITHIN 4 HOURS: * IF OFFICE WILL BE OPEN: You need to be seen within the next 3 or 4 hours. Call your doctor (or NP/PA) now or as soon as the office opens. CALL EMS IF: * You develop any chest pain or shortness of breath. CALL BACK IF: * You become worse CARE ADVICE given per Leg  Pain (Adult) guideline. Comments User: Ledora Bottcher, RN Date/Time [name] Time): 10/08/2021 8:51:25 AM Caller stated there are no appointments in the office  today. Referrals GO TO FACILITY OTHER - SPECIF

## 2021-10-08 NOTE — ED Triage Notes (Signed)
Ambulatory to triage with c/o feeling like heart is beating too hard. Seen at [facility] x 2 weeks for same c/o. C/o chest pain, dull, midsternal with intermittent shortness of breath.  Also c/o bruise to right leg x 2 weeks which was also evaluated at [facility], denies injury.  Received letter that she may need to be evaluated for PAD.

## 2021-10-08 NOTE — Telephone Encounter (Signed)
Patient called because she said she got MRI results and it she said it told her it was urgent and that she needed to see a doctor immediately, she said it said she had blockages in her leg and that it is bruising. I transferred to triage nurse for further eval

## 2021-10-08 NOTE — Telephone Encounter (Signed)
Pt just checked in to the ER. I asked her to call me if she leaves the ER before 5pm. I will call and check on her Monday.

## 2021-10-08 NOTE — ED Provider Notes (Signed)
[facility] Provider Note    Event Date/Time   First MD Initiated Contact with Patient 10/08/21 1541     (approximate)   History   Palpitations   HPI  Lindsey Ward is a 71 y.o. female with past medical history of GERD hyperlipidemia depression chronic fatigue presents with palpitations.  Patient notes that she has had intermittent feeling like her heart was beating rapidly for several weeks.  Comes and goes last for about 4 to 5 minutes at a time.  No clear exacerbating alleviating factors.  Does not necessarily occur with exertion during these episodes she has no syncope presyncope chest pain shortness of breath.  She felt it a little while ago but is asymptomatic currently.  She has told her primary care doctor about this and has cardiology follow-up next week.  Patient also here because she was part of a clinical research trial at [facility] for which she had ABIs done as part of the research study.  She comes with the paper that says on the right it was 0.15 and the left was 1.28 she is concerned because of potential blockage.  She noticed bruising o inner upper thigh and intermittently has some pain in this area denies numbness or weakness.  No pain currently.  Denies any known trauma.   Patient's triage note says she complained of chest pain.  When asked her further about this she says that this morning she did have some chest pain but then when I asked her more about the quality of the pain she says it actually was not pain just the sensation of palpitations.  Denies pain.  Denies shortness of breath.  Past Medical History:  Diagnosis Date   Bowel obstruction (HCC)    Chronic fatigue    Depression    Fibromyalgia    GERD (gastroesophageal reflux disease)    Hyperlipemia    Migraine    Obstipation    Osteopenia    PUD (peptic ulcer disease)     Patient Active Problem List   Diagnosis Date Noted   Right foot pain 08/05/2021   Quadriceps weakness  01/17/2020   Aortic atherosclerosis ([place]) 04/23/2018   Coronary artery calcification seen on CT scan 04/23/2018   Narcotic dependence ([place]) 03/22/2018   Benzodiazepine dependence ([place]) 03/22/2018   Macular hole, left eye 01/19/2017   Iron deficiency anemia 06/05/2016   Fatigue 05/17/2016   Constipation 05/17/2016   LPRD (laryngopharyngeal reflux disease) 09/21/2015   Chronic low back pain with right-sided sciatica 06/17/2015   Advance directive discussed with patient 10/24/2014   Routine general medical examination at a health care facility 07/14/2010   OSTEOPENIA 03/10/2009   Mood disorder ([place]) 08/15/2006   Gastroesophageal reflux disease 08/15/2006   Fibromyalgia 08/15/2006   Sleep disturbance 08/15/2006   Hyperlipidemia 12/20/2005   PANIC ATTACKS 12/20/2005   Atypical migraine 12/20/2005     Physical Exam  Triage Vital Signs: ED Triage Vitals  Enc Vitals Group     BP 10/08/21 1358 (!) 142/57     Pulse Rate 10/08/21 1358 62     Resp 10/08/21 1358 18     Temp 10/08/21 1358 98.3 F (36.8 C)     Temp Source 10/08/21 1358 Oral     SpO2 10/08/21 1358 100 %     Weight 10/08/21 1359 110 lb (49.9 kg)     Height 10/08/21 1359 '5\' 3"'$  (1.6 m)     Head Circumference --      Peak  Flow --      Pain Score 10/08/21 1359 5     Pain Loc --      Pain Edu? --      Excl. in [place]? --     Most recent vital signs: Vitals:   10/08/21 1358 10/08/21 1556  BP: (!) 142/57 (!) 155/53  Pulse: 62 (!) 59  Resp: 18 17  Temp: 98.3 F (36.8 C)   SpO2: 100% 100%     General: Awake, no distress.  CV:  Good peripheral perfusion.  2+ DP and PT pulses bilaterally Resp:  Normal effort.  Abd:  No distention.  Neuro:             Awake, Alert, Oriented x 3  Other:  Small area of ecchymosis on the right inner upper thigh, is nontender compartments are soft   ED Results / Procedures / Treatments  Labs (all labs ordered are listed, but only abnormal results are displayed) Labs Reviewed   COMPREHENSIVE METABOLIC PANEL - Abnormal; Notable for the following components:      Result Value   Glucose, Bld 105 (*)    All other components within normal limits  CBC WITH DIFFERENTIAL/PLATELET  TROPONIN I (HIGH SENSITIVITY)     EKG  EKG interpretation performed by myself: NSR, nml axis, nml intervals, no acute ischemic changes    RADIOLOGY Chest x-ray reviewed and interpreted myself shows circular opacity in the right perihilar region could be artifact   PROCEDURES:  Critical Care performed: No  .1-3 Lead EKG Interpretation  Performed by: Rada Hay, MD Authorized by: Rada Hay, MD     Interpretation: normal     ECG rate assessment: normal     Rhythm: sinus rhythm     Ectopy: none     Conduction: normal     The patient is on the cardiac monitor to evaluate for evidence of arrhythmia and/or significant heart rate changes.   MEDICATIONS ORDERED IN ED: Medications - No data to display   IMPRESSION / MDM / [place] / ED COURSE  I reviewed the triage vital signs and the nursing notes.                              Patient's presentation is most consistent with acute presentation with potential threat to life or bodily function.  Differential diagnosis includes, but is not limited to, atrial fibrillation, PACs, ventricular tachycardia, peripheral vascular disease, DVT, trauma  Patient is a 71 year old with past medical history of fibromyalgia presents due to concern for palpitations and a ABI that was done as part of a research study that was concerning for possible peripheral vascular disease.  Palpitations of been going on for some time.  She endorses intermittent feeling of irregular/fast rate that only last about 5 minutes and is not associated with syncope pain or shortness of breath.  She denies chest pain or shortness of breath to me.  Her EKG shows sinus rhythm without acute ischemic changes or any concerning findings.  Patient's  electrolytes are normal, troponin is negative.  Will refer to cardiology for possible cardiac monitor pain given the symptoms have been somewhat persistent but do not feel she needs admission or further work-up today for this.  Patient does have results of an ABI that I can see that were done not because of patient's symptoms or exam but as part of a research study.  ABI on the right  was 0.15 and on the left is 1.28.  Patient does endorse some right upper leg pain this is intermittent nonexertional and on exam she has good 2+ DP and PT pulses.  Clinically I am not concerned for significant vascular disease.  Patient has follow-up with cardiology regarding her palpitations next week I told her to discuss these results with them at that time.  Do not feel that she needs any further intervention or work-up given her benign exam.  Patient's chest x-ray does show potential artifact due to EKG read.  I personally remove the leads and repeated the x-ray this time of tomorrow and it was normal.  Patient is appropriate for discharge at this time.       FINAL CLINICAL IMPRESSION(S) / ED DIAGNOSES   Final diagnoses:  Palpitations     Rx / DC Orders   ED Discharge Orders     None        Note:  This document was prepared using Dragon voice recognition software and may include unintentional dictation errors.   Rada Hay, MD 10/08/21 2352

## 2021-10-08 NOTE — Telephone Encounter (Signed)
Agree with nurses advice and plan.

## 2021-10-08 NOTE — Telephone Encounter (Deleted)
[facility] - Client TELEPHONE ADVICE RECORD Access[facility]urse Patient Name: Lindsey Ward Gender: Female DOB: 06/18/1950 Age: 71 Y 37 M 13 D Return Phone Number: [phone] (Primary) Address: City/ State/ Zip: [place]  [address] Client [facility] - Client Client Site [place] - Day Provider Viviana Simpler- MD Contact Type Call Who Is Calling Patient / Member / Family / Caregiver Call Type Triage / Clinical Relationship To Patient Self Return Phone Number [phone] (Primary) Chief Complaint Leg Pain Reason for Call Symptomatic / Request for [place] says that she had an MRI recently that showed some blockage in her leg and she is concerned. She has pain and a big bruise on her rt her leg. PT says she also had blood work that show concerns. One side says 1.29, and the other 0.15 which is "severe". NOTE: Per office they don't have PT results yet. [place] Not Listed Urgent care Translation No Nurse Assessment Nurse: Hardin Negus, RN, Mardene Celeste Date/Time [name] Time): 10/08/2021 8:42:39 AM Confirm and document reason for call. If symptomatic, describe symptoms. ---She had an MRI recently that showed some blockage in her leg and she is concerned. She has pain and a big bruise on her rt her leg. PT says she also had blood work that show concerns. One side says 1.29, and the other 0.15 which is "severe". She continues to have pain and tingling.. She has some bruising in the inside of thigh of her right leg. She noticed one day this day. It is bigger today. She can walk on the leg. Does the patient have any new or worsening symptoms? ---Yes Will a triage be completed? ---Yes Related visit to physician within the last 2 weeks? ---Yes Does the PT have any chronic conditions? (i.e. diabetes, asthma, this includes High risk factors for pregnancy,  etc.) ---Yes List chronic conditions. ---fibromyalgia Is this a behavioral health or substance abuse call? ---No PLEASE NOTE: All timestamps contained within this report are represented as [place] Standard Time. CONFIDENTIALTY NOTICE: This fax transmission is intended only for the addressee. It contains information that is legally privileged, confidential or otherwise protected from use or disclosure. If you are not the intended recipient, you are strictly prohibited from reviewing, disclosing, copying using or disseminating any of this information or taking any action in reliance on or regarding this information. If you have received this fax in error, please notify us immediately by telephone so that we can arrange for its return to [place]. Phone: [phone], Toll-Free: [phone], Fax: [phone] Page: 2 of 2 Call Id: [id number] Guidelines Guideline Title Affirmed Question Affirmed Notes Nurse Date/Time [name] Time) Leg Pain Illness requiring prolonged bedrest in past month (e.g., immobilization, long hospital stay) Hardin Negus, RN, Mardene Celeste 10/08/2021 8:44:47 AM Disp. Time [name] Time) Disposition Final User 10/08/2021 8:50:38 AM See HCP within 4 Hours (or PCP triage) Yes Hardin Negus, RN, Mardene Celeste Final Disposition 10/08/2021 8:50:38 AM See HCP within 4 Hours (or PCP triage) Yes Hardin Negus, RN, Len[place] Ponds Disagree/Comply Comply Caller Understands Yes PreDisposition Call Doctor Care Advice Given Per Guideline SEE HCP (OR PCP TRIAGE) WITHIN 4 HOURS: * IF OFFICE WILL BE OPEN: You need to be seen within the next 3 or 4 hours. Call your doctor (or NP/PA) now or as soon as the office opens. CALL EMS IF: * You develop any chest pain or shortness of breath. CALL BACK IF: * You become worse CARE ADVICE given per Leg  Pain (Adult) guideline. Comments User: Ledora Bottcher, RN Date/Time [name] Time): 10/08/2021 8:51:25 AM Caller stated there are no appointments in the office  today. Referrals GO TO FACILITY OTHER - SPECIF

## 2021-10-08 NOTE — Telephone Encounter (Signed)
See note below access nurse;I spoke with pt; pt recently had MRI at [facility] for some type of research pt is not sure what results of test were or what MRI was of. Pt cannot find her paper work at this moment and did not see under care everywhere.  Pt did go to [facility] on 10/08/21 and pt said provider advised pt to get [place] and see cardiologist. Pt does not have cardiologist. Pt said small bruise on rt upper leg noticed one day last wk. Now pt has large bruise on rt upper leg that is 3" x 2 " no red streaks seen and pt said no pain and no swelling now in rt upper leg. Pt said she does have tingling in rt upper leg and rt knee cap hurts. No CP or SOB. No known injury.Pt is going to [facility] ED for eval and testing. Sending note to DR Silvio Pate who is out of office and Dr Einar Pheasant who is in office. Pt will cb next wk with update also.

## 2021-10-11 NOTE — Telephone Encounter (Signed)
Spoke to pt. She said they did not check her leg for the pain in the leg. But, she does say she thinks the pain came from the position she was in for her MRI. She is going to try a hydrocodone to see if that helps the pain.

## 2021-10-18 ENCOUNTER — Encounter: Payer: Self-pay | Admitting: Internal Medicine

## 2021-10-18 ENCOUNTER — Ambulatory Visit (INDEPENDENT_AMBULATORY_CARE_PROVIDER_SITE_OTHER): Payer: Medicare Other | Admitting: Internal Medicine

## 2021-10-18 VITALS — BP 122/70 | HR 76 | Temp 97.6°F | Ht 63.0 in | Wt 107.0 lb

## 2021-10-18 DIAGNOSIS — I8393 Asymptomatic varicose veins of bilateral lower extremities: Secondary | ICD-10-CM

## 2021-10-18 DIAGNOSIS — R002 Palpitations: Secondary | ICD-10-CM | POA: Diagnosis not present

## 2021-10-18 DIAGNOSIS — I8390 Asymptomatic varicose veins of unspecified lower extremity: Secondary | ICD-10-CM | POA: Insufficient documentation

## 2021-10-18 NOTE — Assessment & Plan Note (Signed)
Long standing Predated ritalin Might be anxiety/panic related--but should get cardiology evaluation (will make referral again)

## 2021-10-18 NOTE — Progress Notes (Signed)
Subjective:    Patient ID: Lindsey Ward, female    DOB: [birth year], 71 y.o.   MRN: [id number]  HPI Here with sister for ER follow up  Went to urgent care--then ER She was worried about a "protrusion" on left leg Some increase in size in the past couple of day It has been there--but is growing Better in the morning  Still has some palpitations Wound up canceling the appointment with cardiology These go back a long time --so not clearly from the ritalin No chest pain No SOB for the most part  Current Outpatient Medications on File Prior to Visit  Medication Sig Dispense Refill   ALPRAZolam (XANAX) 1 MG tablet TAKE 1 TABLET BY MOUTH THREE TIMES A DAY AS NEEDED 90 tablet 0   HYDROcodone-acetaminophen (NORCO/VICODIN) 5-325 MG tablet Take 1 tablet by mouth 3 (three) times daily as needed. Dx Code M79.7 90 tablet 0   ketoconazole (NIZORAL) 2 % cream Apply 1 application topically 2 (two) times daily as needed for irritation. 60 g 1   methylphenidate (RITALIN) 10 MG tablet Take 1 tablet (10 mg total) by mouth 2 (two) times daily. 60 tablet 0   omeprazole (PRILOSEC) 20 MG capsule TAKE 1 CAPSULE BY MOUTH EVERY DAY 90 capsule 3   Polyethyl Glycol-Propyl Glycol (SYSTANE OP) Apply to eye.     promethazine (PHENERGAN) 12.5 MG tablet Take 1-2 tablets (12.5-25 mg total) by mouth 3 (three) times daily as needed for nausea or vomiting. 60 tablet 0   SUMAtriptan (IMITREX) 50 MG tablet TAKE 0.5-1 TABLET BY MOUTH DAILY AS NEEDED FOR MIGRAINE. 9 tablet 11   triamcinolone cream (KENALOG) 0.1 % APPLY 1 APPLICATION TOPICALLY 2 (TWO) TIMES DAILY AS NEEDED. 45 g 1   No current facility-administered medications on file prior to visit.    Allergies  Allergen Reactions   Tizanidine Other (See Comments)    "throws me in a different world"   Aspirin     REACTION: GI bleed   Buspirone Hcl     REACTION: unspecified   Ciprofloxacin     REACTION: unspecified   Citalopram Other (See Comments)    Ear  ringing.    Codeine    Diazepam    Duloxetine     REACTION: sz like activity   Ibuprofen     REACTION: GI bleed   Imipramine Hcl Other (See Comments)    Ear ringing.    Oxycodone-Acetaminophen     REACTION: unspecified   Pregabalin     REACTION: kept her up and confusion   Propoxyphene Hcl    Risperidone     REACTION: confusion   Sulfonamide Derivatives     REACTION: unspecified   Tramadol Hcl     REACTION: doesn't remember what happened but couldn't tolerate   Tramadol Hcl Other (See Comments)    REACTION: doesn't remember what happened but couldn't tolerate REACTION: doesn't remember what happened but couldn't tolerate REACTION: doesn't remember what happened but couldn't tolerate    Venlafaxine     REACTION: unspecified   Methocarbamol Rash    Past Medical History:  Diagnosis Date   Bowel obstruction (HCC)    Chronic fatigue    Depression    Fibromyalgia    GERD (gastroesophageal reflux disease)    Hyperlipemia    Migraine    Obstipation    Osteopenia    PUD (peptic ulcer disease)     Past Surgical History:  Procedure Laterality Date   ABDOMINAL SURGERY  APPENDECTOMY  2005   BREAST BIOPSY Bilateral 1999   rt w/out clip - neg, lt w/clip - neg   BREAST CYST ASPIRATION Right 2003   neg   CHOLECYSTECTOMY  2004   COLONOSCOPY WITH PROPOFOL N/A 06/09/2016   Procedure: COLONOSCOPY WITH PROPOFOL;  Surgeon: Jonathon Bellows, MD;  Location: [facility] ENDOSCOPY;  Service: Endoscopy;  Laterality: N/A;   ESOPHAGOGASTRODUODENOSCOPY (EGD) WITH PROPOFOL N/A 06/09/2016   Procedure: ESOPHAGOGASTRODUODENOSCOPY (EGD) WITH PROPOFOL;  Surgeon: Jonathon Bellows, MD;  Location: [facility] ENDOSCOPY;  Service: Endoscopy;  Laterality: N/A;   EYE SURGERY     GIVENS CAPSULE STUDY N/A 06/23/2016   Procedure: GIVENS CAPSULE STUDY;  Surgeon: Jonathon Bellows, MD;  Location: [facility] ENDOSCOPY;  Service: Endoscopy;  Laterality: N/A;   PARS PLANA VITRECTOMY W/ REPAIR OF MACULAR HOLE Left 01/2017   [facility]   TOTAL  ABDOMINAL HYSTERECTOMY W/ BILATERAL SALPINGOOPHORECTOMY  1986    Family History  Problem Relation Age of Onset   Heart disease Mother        AMI   Stroke Mother    Heart disease Father    Hypertension Sister    Breast cancer Sister    Hypertension Sister    Heart disease Sister        MI   Diabetes Sister    Kidney cancer Neg Hx    Kidney disease Neg Hx    Prostate cancer Neg Hx     Social History   Socioeconomic History   Marital status: Single    Spouse name: Not on file   Number of children: 1   Years of education: Not on file   Highest education level: Not on file  Occupational History   Occupation: DISABLED  Tobacco Use   Smoking status: Former    Packs/day: 1.00    Years: 40.00    Total pack years: 40.00    Types: Cigarettes    Quit date: 09/25/2013    Years since quitting: 8.0    Passive exposure: Never   Smokeless tobacco: Never   Tobacco comments:       Vaping Use   Vaping Use: Never used  Substance and Sexual Activity   Alcohol use: Not Currently    Alcohol/week: 0.0 standard drinks of alcohol    Comment: rarely   Drug use: No   Sexual activity: Not on file  Other Topics Concern   Not on file  Social History Narrative   Living alone again      No living will   Daughter should make health care decisions   Requests DNR---done 10/24/14   No tube feeds if cognitively unaware   Highest level of education: 9th   Social Determinants of Health   Financial Resource Strain: Not on file  Food Insecurity: Not on file  Transportation Needs: Not on file  Physical Activity: Not on file  Stress: Not on file  Social Connections: Not on file  Intimate Partner Violence: Not on file   Review of Systems     Objective:   Physical Exam Constitutional:      Appearance: Normal appearance.  Musculoskeletal:     Comments: Small reducible varicose veins both legs---she points to one of them as the concerning area  Neurological:     Mental Status: She is  alert.            Assessment & Plan:

## 2021-10-18 NOTE — Assessment & Plan Note (Signed)
She was mostly concerned about a blood clot---but explained that this is not DVT No pain or edema either--so can hold off on support hose She does keep leg up when sitting reassured

## 2021-10-25 ENCOUNTER — Ambulatory Visit (INDEPENDENT_AMBULATORY_CARE_PROVIDER_SITE_OTHER): Payer: Medicare Other

## 2021-10-25 ENCOUNTER — Encounter: Payer: Self-pay | Admitting: Cardiology

## 2021-10-25 ENCOUNTER — Ambulatory Visit: Payer: Medicare Other | Admitting: Cardiology

## 2021-10-25 VITALS — BP 130/70 | HR 67 | Ht 64.0 in | Wt 108.0 lb

## 2021-10-25 DIAGNOSIS — R002 Palpitations: Secondary | ICD-10-CM

## 2021-10-25 DIAGNOSIS — I8393 Asymptomatic varicose veins of bilateral lower extremities: Secondary | ICD-10-CM | POA: Diagnosis not present

## 2021-10-25 DIAGNOSIS — K2100 Gastro-esophageal reflux disease with esophagitis, without bleeding: Secondary | ICD-10-CM

## 2021-10-25 NOTE — Patient Instructions (Signed)
Medication Instructions:   Your physician recommends that you continue on your current medications as directed. Please refer to the Current Medication list given to you today.   *If you need a refill on your cardiac medications before your next appointment, please call your pharmacy*   Testing/Procedures:  Your physician has recommended that you wear a Zio XT monitor for 2 weeks. This will be mailed to your home address in 4-5 business days.   Your clinician has requested a Zio heart rhythm monitor by i[employer] to be mailed to your home for you to wear for 14 days. You should expect a small box to arrive via USPS (or FedEx in some cases) within this next week. If you do not receive it please call [employer] at [phone].  Closely watching your heart at this time will help your care team understand more and provide information needed to develop your plan of care.  Please apply your Zio patch monitor the day you receive it. Keep this packaging, you will use this to return your Zio monitor.  You will easily be able to apply the monitor with the instructions provided in the Patient Guide.  If you need assistance, [employer] representatives are available 24/7 at [phone].  You can also download the [facility] app on your phone to view detailed application instructions and log symptoms.  After you wear your monitor for 14 days, place it back in the blue box or envelope, along with your Symptom Log.  To send your monitor back: Simply use the pre-addressed and pre-paid box/envelope.  Send it back through [employer] the same day you remove it via your local post office or by placing it in your mailbox.  As soon as we receive the results, they will be reviewed and your clinician will contact you.  For the first 24 hours- it is essential to not shower or exercise, to allow the patch to adhere to your skin. Avoid excessive sweating to help maximize wear time. Do not submerge the device, no hot  tubs, and no swimming pools. Keep any lotions or oils away from the patch. After 24 hours you may shower with the patch on. Take brief showers with your back facing the shower head.  Do not remove patch once it has been placed because that will interrupt data and decrease adhesive wear time. Push the button when you have any symptoms and write down what you were feeling. Once you have completed wearing your monitor, remove and place into box which has postage paid and place in your outgoing mailbox.  If for some reason you have misplaced your box then call our office and we can provide another box and/or mail it off for you.    Follow-Up: At [facility], you and your health needs are our priority.  As part of our continuing mission to provide you with exceptional heart care, we have created designated Provider Care Teams.  These Care Teams include your primary Cardiologist (physician) and Advanced Practice Providers (APPs -  Physician Assistants and Nurse Practitioners) who all work together to provide you with the care you need, when you need it.  We recommend signing up for the patient portal called "MyChart".  Sign up information is provided on this After Visit Summary.  MyChart is used to connect with patients for Virtual Visits (Telemedicine).  Patients are able to view lab/test results, encounter notes, upcoming appointments, etc.  Non-urgent messages can be sent to your provider as well.   To learn  more about what you can do with MyChart, go to [email or web address].    Your next appointment:   6 week(s)  The format for your next appointment:   In Person  Provider:   You may see Kate Sable, MD or one of the following Advanced Practice Providers on your designated Care Team:   Murray Hodgkins, NP Christell Faith, PA-C Cadence Kathlen Mody, [place]    Other Instructions   Important Information About Sugar

## 2021-10-25 NOTE — Progress Notes (Signed)
Cardiology Office Note:    Date:  10/25/2021   ID:  Lindsey Ward, DOB 29-Jun-1950, MRN [id number]  PCP:  Venia Carbon, MD   [place] [facility] Cardiologist:  Kate Sable, MD     Referring MD: Venia Carbon, MD   Chief Complaint  Patient presents with   NEW patient-Evaluate palpitations    History of Present Illness:    Lindsey Ward is a 71 y.o. female with a hx of GERD, former smoker x25+ years, who presents with palpitations.  Symptoms of palpitations ongoing over the past 6 months.  Symptoms are core about 4 times monthly, not associated with dizziness.  Has occasional prominent heartbeat.  Denies chest pain, shortness of breath, edema.  Has a small area of swelling in the left calf, which is nontender.  Past Medical History:  Diagnosis Date   Bowel obstruction (HCC)    Chronic fatigue    Depression    Fibromyalgia    GERD (gastroesophageal reflux disease)    Hyperlipemia    Migraine    Obstipation    Osteopenia    PUD (peptic ulcer disease)     Past Surgical History:  Procedure Laterality Date   ABDOMINAL SURGERY     APPENDECTOMY  2005   BREAST BIOPSY Bilateral 1999   rt w/out clip - neg, lt w/clip - neg   BREAST CYST ASPIRATION Right 2003   neg   CHOLECYSTECTOMY  2004   COLONOSCOPY WITH PROPOFOL N/A 06/09/2016   Procedure: COLONOSCOPY WITH PROPOFOL;  Surgeon: Jonathon Bellows, MD;  Location: [facility] ENDOSCOPY;  Service: Endoscopy;  Laterality: N/A;   ESOPHAGOGASTRODUODENOSCOPY (EGD) WITH PROPOFOL N/A 06/09/2016   Procedure: ESOPHAGOGASTRODUODENOSCOPY (EGD) WITH PROPOFOL;  Surgeon: Jonathon Bellows, MD;  Location: [facility] ENDOSCOPY;  Service: Endoscopy;  Laterality: N/A;   EYE SURGERY     GIVENS CAPSULE STUDY N/A 06/23/2016   Procedure: GIVENS CAPSULE STUDY;  Surgeon: Jonathon Bellows, MD;  Location: [facility] ENDOSCOPY;  Service: Endoscopy;  Laterality: N/A;   PARS PLANA VITRECTOMY W/ REPAIR OF MACULAR HOLE Left 01/2017   [facility]   TOTAL ABDOMINAL HYSTERECTOMY W/  BILATERAL SALPINGOOPHORECTOMY  1986    Current Medications: Current Meds  Medication Sig   ALPRAZolam (XANAX) 1 MG tablet TAKE 1 TABLET BY MOUTH THREE TIMES A DAY AS NEEDED   HYDROcodone-acetaminophen (NORCO/VICODIN) 5-325 MG tablet Take 1 tablet by mouth 3 (three) times daily as needed. Dx Code M79.7   ketoconazole (NIZORAL) 2 % cream Apply 1 application topically 2 (two) times daily as needed for irritation.   Menthol, Topical Analgesic, (BIOFREEZE EX) Apply 1 application  topically as needed.   methylphenidate (RITALIN) 10 MG tablet Take 1 tablet (10 mg total) by mouth 2 (two) times daily.   omeprazole (PRILOSEC) 20 MG capsule TAKE 1 CAPSULE BY MOUTH EVERY DAY   Polyethyl Glycol-Propyl Glycol (SYSTANE OP) Apply to eye as needed.   promethazine (PHENERGAN) 12.5 MG tablet Take 1-2 tablets (12.5-25 mg total) by mouth 3 (three) times daily as needed for nausea or vomiting.   SUMAtriptan (IMITREX) 50 MG tablet TAKE 0.5-1 TABLET BY MOUTH DAILY AS NEEDED FOR MIGRAINE.   triamcinolone cream (KENALOG) 0.1 % APPLY 1 APPLICATION TOPICALLY 2 (TWO) TIMES DAILY AS NEEDED.     Allergies:   Tizanidine, Aspirin, Buspirone hcl, Ciprofloxacin, Citalopram, Codeine, Diazepam, Duloxetine, Ibuprofen, Imipramine hcl, Oxycodone-acetaminophen, Pregabalin, Propoxyphene hcl, Risperidone, Sulfonamide derivatives, Tramadol hcl, Tramadol hcl, Venlafaxine, and Methocarbamol   Social History   Socioeconomic History   Marital status: Single  Spouse name: Not on file   Number of children: 1   Years of education: Not on file   Highest education level: Not on file  Occupational History   Occupation: DISABLED  Tobacco Use   Smoking status: Former    Packs/day: 1.00    Years: 40.00    Total pack years: 40.00    Types: Cigarettes    Quit date: 09/25/2013    Years since quitting: 8.0    Passive exposure: Never   Smokeless tobacco: Never   Tobacco comments:       Vaping Use   Vaping Use: Never used  Substance  and Sexual Activity   Alcohol use: Not Currently    Alcohol/week: 0.0 standard drinks of alcohol    Comment: rarely   Drug use: No   Sexual activity: Not on file  Other Topics Concern   Not on file  Social History Narrative   Living alone again      No living will   Daughter should make health care decisions   Requests DNR---done 10/24/14   No tube feeds if cognitively unaware   Highest level of education: 9th   Social Determinants of Health   Financial Resource Strain: Not on file  Food Insecurity: Not on file  Transportation Needs: Not on file  Physical Activity: Not on file  Stress: Not on file  Social Connections: Not on file     Family History: The patient's family history includes Arthritis/Rheumatoid in her sister; Breast cancer in her sister; COPD in her sister; Diabetes in her sister; Heart disease in her father, mother, and sister; Hypertension in her sister and sister; Stroke in her mother. There is no history of Kidney cancer, Kidney disease, or Prostate cancer.  ROS:   Please see the history of present illness.     All other systems reviewed and are negative.  EKGs/Labs/Other Studies Reviewed:    The following studies were reviewed today:   EKG:  EKG is  ordered today.  The ekg ordered today demonstrates normal sinus rhythm, old septal infarct, incomplete left bundle branch block  Recent Labs: 10/08/2021: ALT 22; BUN 9; Creatinine, Ser 0.67; Hemoglobin 12.6; Platelets 309; Potassium 3.5; Sodium 140  Recent Lipid Panel    Component Value Date/Time   CHOL 231 (H) 03/08/2016 1243   TRIG 129.0 03/08/2016 1243   HDL 42.40 03/08/2016 1243   CHOLHDL 5 03/08/2016 1243   VLDL 25.8 03/08/2016 1243   LDLCALC 162 (H) 03/08/2016 1243   LDLDIRECT 153.7 10/10/2012 1139     Risk Assessment/Calculations:         Physical Exam:    VS:  BP 130/70 (BP Location: Right Arm, Patient Position: Sitting, Cuff Size: Normal)   Pulse 67   Ht '5\' 4"'$  (1.626 m)   Wt 108 lb  (49 kg)   SpO2 94%   BMI 18.54 kg/m     Wt Readings from Last 3 Encounters:  10/25/21 108 lb (49 kg)  10/18/21 107 lb (48.5 kg)  10/08/21 110 lb (49.9 kg)     GEN:  Well nourished, well developed in no acute distress HEENT: Normal NECK: No JVD; No carotid bruits CARDIAC: RRR, no murmurs, rubs, gallops RESPIRATORY:  Clear to auscultation without rales, wheezing or rhonchi  ABDOMEN: Soft, non-tender, non-distended MUSCULOSKELETAL:  No edema; varicose veins, nontender SKIN: Warm and dry NEUROLOGIC:  Alert and oriented x 3 PSYCHIATRIC:  Normal affect   ASSESSMENT:    1. Palpitations   2. Gastroesophageal reflux  disease with esophagitis without hemorrhage   3. Asymptomatic varicose veins of both lower extremities    PLAN:    In order of problems listed above:  Palpitations, place cardiac monitor to evaluate any significant arrhythmias. GERD, controlled with PPI, continue omeprazole. Varicose veins, no edema, small left calf swelling nontender, conservative measures, compression stockings if needed advised.  No indication for additional testing.  Follow-up 6 weeks      Medication Adjustments/Labs and Tests Ordered: Current medicines are reviewed at length with the patient today.  Concerns regarding medicines are outlined above.  Orders Placed This Encounter  Procedures   LONG TERM MONITOR (3-14 DAYS)   EKG 12-Lead   No orders of the defined types were placed in this encounter.   Patient Instructions  Medication Instructions:   Your physician recommends that you continue on your current medications as directed. Please refer to the Current Medication list given to you today.   *If you need a refill on your cardiac medications before your next appointment, please call your pharmacy*   Testing/Procedures:  Your physician has recommended that you wear a Zio XT monitor for 2 weeks. This will be mailed to your home address in 4-5 business days.   Your clinician has  requested a Zio heart rhythm monitor by [employer] to be mailed to your home for you to wear for 14 days. You should expect a small box to arrive via USPS (or FedEx in some cases) within this next week. If you do not receive it please call [employer] at [phone].  Closely watching your heart at this time will help your care team understand more and provide information needed to develop your plan of care.  Please apply your Zio patch monitor the day you receive it. Keep this packaging, you will use this to return your Zio monitor.  You will easily be able to apply the monitor with the instructions provided in the Patient Guide.  If you need assistance, [employer] representatives are available 24/7 at [phone].  You can also download the [facility] app on your phone to view detailed application instructions and log symptoms.  After you wear your monitor for 14 days, place it back in the blue box or envelope, along with your Symptom Log.  To send your monitor back: Simply use the pre-addressed and pre-paid box/envelope.  Send it back through [employer] the same day you remove it via your local post office or by placing it in your mailbox.  As soon as we receive the results, they will be reviewed and your clinician will contact you.  For the first 24 hours- it is essential to not shower or exercise, to allow the patch to adhere to your skin. Avoid excessive sweating to help maximize wear time. Do not submerge the device, no hot tubs, and no swimming pools. Keep any lotions or oils away from the patch. After 24 hours you may shower with the patch on. Take brief showers with your back facing the shower head.  Do not remove patch once it has been placed because that will interrupt data and decrease adhesive wear time. Push the button when you have any symptoms and write down what you were feeling. Once you have completed wearing your monitor, remove and place into box which has postage paid and  place in your outgoing mailbox.  If for some reason you have misplaced your box then call our office and we can provide another box and/or mail it off for you.  Follow-Up: At [facility], you and your health needs are our priority.  As part of our continuing mission to provide you with exceptional heart care, we have created designated Provider Care Teams.  These Care Teams include your primary Cardiologist (physician) and Advanced Practice Providers (APPs -  Physician Assistants and Nurse Practitioners) who all work together to provide you with the care you need, when you need it.  We recommend signing up for the patient portal called "MyChart".  Sign up information is provided on this After Visit Summary.  MyChart is used to connect with patients for Virtual Visits (Telemedicine).  Patients are able to view lab/test results, encounter notes, upcoming appointments, etc.  Non-urgent messages can be sent to your provider as well.   To learn more about what you can do with MyChart, go to [email or web address].    Your next appointment:   6 week(s)  The format for your next appointment:   In Person  Provider:   You may see Kate Sable, MD or one of the following Advanced Practice Providers on your designated Care Team:   Murray Hodgkins, NP Christell Faith, PA-C Cadence Kathlen Mody, [place]    Other Instructions   Important Information About Sugar         Signed, Kate Sable, MD  10/25/2021 12:17 PM    [place]

## 2021-10-27 ENCOUNTER — Other Ambulatory Visit: Payer: Self-pay | Admitting: Internal Medicine

## 2021-10-27 MED ORDER — METHYLPHENIDATE HCL 10 MG PO TABS: 10.0000 mg | ORAL_TABLET | Freq: Two times a day (BID) | ORAL | 0 refills | Status: DC

## 2021-10-27 NOTE — Telephone Encounter (Signed)
Last written 09-22-21 #60 Last OV 10-18-21 Next OV 04-01-22 [facility]

## 2021-10-27 NOTE — Telephone Encounter (Signed)
  Encourage patient to contact the pharmacy for refills or they can request refills through [facility]  Did the patient contact the pharmacy:  yes   LAST APPOINTMENT DATE:  Please schedule appointment if longer than 1 year  NEXT APPOINTMENT DATE:04/01/2022  MEDICATION:methylphenidate (RITALIN) 10 MG tablet  Is the patient out of medication? no  If not, how much is left? 3 days left  Is this a 90 day supply: no  [name] [place]  [phone] Fax:  [phone]     Let patient know to contact pharmacy at the end of the day to make sure medication is ready.  Please notify patient to allow 48-72 hours to process  CLINICAL FILLS OUT ALL BELOW:

## 2021-10-28 ENCOUNTER — Telehealth: Payer: Self-pay | Admitting: Internal Medicine

## 2021-10-28 DIAGNOSIS — R002 Palpitations: Secondary | ICD-10-CM

## 2021-10-28 MED ORDER — METHYLPHENIDATE HCL 10 MG PO TABS: 10.0000 mg | ORAL_TABLET | Freq: Two times a day (BID) | ORAL | 0 refills | Status: DC

## 2021-10-28 NOTE — Telephone Encounter (Signed)
Pt says she called around and could not find the '10mg'$ . She has some '5mg'$  left and will make her dosage that way and hopes a pharmacy will have it next week.

## 2021-10-28 NOTE — Addendum Note (Signed)
Addended by: Pilar Grammes on: 10/28/2021 04:50 PM   Modules accepted: Orders

## 2021-10-28 NOTE — Telephone Encounter (Signed)
Patient called and stated that she is out of her  methylphenidate (RITALIN) 10 MG tablet and the pharmacy is out of it as well and wanted to know if she can use something else. Call back number [phone].

## 2021-10-28 NOTE — Addendum Note (Signed)
Addended by: Viviana Simpler I on: 10/28/2021 04:52 PM   Modules accepted: Orders

## 2021-10-28 NOTE — Telephone Encounter (Signed)
Patient states that [facility], [place], [place] [address] has the medication in stock.

## 2021-11-10 DIAGNOSIS — H16223 Keratoconjunctivitis sicca, not specified as Sjogren's, bilateral: Secondary | ICD-10-CM | POA: Diagnosis not present

## 2021-11-10 DIAGNOSIS — H01009 Unspecified blepharitis unspecified eye, unspecified eyelid: Secondary | ICD-10-CM | POA: Diagnosis not present

## 2021-11-16 ENCOUNTER — Other Ambulatory Visit: Payer: Self-pay | Admitting: Internal Medicine

## 2021-11-16 MED ORDER — HYDROCODONE-ACETAMINOPHEN 5-325 MG PO TABS: 1.0000 | ORAL_TABLET | Freq: Three times a day (TID) | ORAL | 0 refills | Status: DC | PRN

## 2021-11-16 NOTE — Telephone Encounter (Signed)
Last filled 06-22-21 #90 Last OV 10-18-21 Next OV 04-01-22 [facility]

## 2021-11-16 NOTE — Telephone Encounter (Signed)
  Encourage patient to contact the pharmacy for refills or they can request refills through [facility]  Did the patient contact the pharmacy:  N    LAST APPOINTMENT DATE:  8.07.23  NEXT APPOINTMENT DATE: 1.19.24  MEDICATION: HYDROcodone-acetaminophen (NORCO/VICODIN) 5-325 MG tablet  Is the patient out of medication? N  If not, how much is left?  Is this a 90 day supply:   [name] [place]  [phone] Fax:  [phone]     Let patient know to contact pharmacy at the end of the day to make sure medication is ready.  Please notify patient to allow 48-72 hours to process

## 2021-11-19 DIAGNOSIS — R002 Palpitations: Secondary | ICD-10-CM | POA: Diagnosis not present

## 2021-11-29 ENCOUNTER — Other Ambulatory Visit: Payer: Self-pay | Admitting: Internal Medicine

## 2021-11-29 NOTE — Telephone Encounter (Signed)
Last filled 09/22/21 #90  Last OV 10/18/21 Next OV 04/01/22 [facility]

## 2021-12-03 ENCOUNTER — Telehealth: Payer: Self-pay | Admitting: Internal Medicine

## 2021-12-03 NOTE — Telephone Encounter (Signed)
Patient called in stating that she she been experiencing a lot of bone pain in her back,legs,and hips. She would like to know if its okay to take her hydrocodone with her ritalin?

## 2021-12-03 NOTE — Telephone Encounter (Signed)
Patient called and stated to call [phone].

## 2021-12-06 NOTE — Telephone Encounter (Signed)
Spoke to pt

## 2021-12-08 NOTE — Progress Notes (Unsigned)
Cardiology Office Note    Date:  12/09/2021   ID:  Lindsey Ward, DOB 04-Feb-1951, MRN [id number]  PCP:  Venia Carbon, MD  Cardiologist:  Kate Sable, MD  Electrophysiologist:  None   Chief Complaint: Follow-up  History of Present Illness:   Lindsey Ward is a 71 y.o. female with history of coronary calcification, PSVT, fibromyalgia, HLD, chronic fatigue, chronic pain, depression, multiple medication intolerances, prior tobacco use, varicose veins, and anxiety who presents for follow-up of Zio patch.  She was previously evaluated by Dr. Rockey Situ in 2020 for tachypalpitations with associated chest discomfort.  Prior CT imaging from 2019 was reviewed which showed mild coronary artery calcification in the proximal LAD, proximal LCx, and RCA as well as mild aortic atherosclerosis.  Further work-up was deferred at that time.  She was started on ezetimibe, though did not tolerate this secondary to tongue "tingling and swelling," as well as a blister on her lip and pruritus of the lower legs.  She was seen in the ED in 09/2021 with palpitations lasting 4 to 5 minutes at a time.  High-sensitivity troponin negative.  Chest x-ray without acute process.  EKG showed sinus rhythm without acute ischemic changes.  She was evaluated by Dr. Garen Lah on 10/25/2021 for palpitations without associated cardiac symptoms.  Subsequent Zio patch in 10/2021 demonstrated a predominant rhythm of sinus, first-degree AV block, intermittent bundle branch block, 4 episodes of SVT with the longest interval lasting 13 beats with rare PACs and PVCs.  She comes in accompanied by her daughter today and is doing well from a cardiac perspective, without symptoms of angina or decompensation.  She reports a history of intermittent brief palpitations that will occur every 2 to 3 weeks and typically last several minutes with spontaneous resolution.  No associated symptoms.  No lower extremity swelling, orthopnea, or early  satiety.  She has been without palpitations since her last visit.  Blood pressure is typically well controlled.  Overall, she feels like she is doing well from a cardiac perspective.   Labs independently reviewed: 09/2021 - potassium 3.5, BUN 9, serum creatinine 0.67, albumin 4.2, AST/ALT normal, Hgb 12.6, PLT 309 06/2021 - Free T4 normal 10/2020 - TSH normal 02/2016 - TC 231, TG 129, HDL 42, LDL 162  Past Medical History:  Diagnosis Date   Bowel obstruction (HCC)    Chronic fatigue    Depression    Fibromyalgia    GERD (gastroesophageal reflux disease)    Hyperlipemia    Migraine    Obstipation    Osteopenia    PUD (peptic ulcer disease)     Past Surgical History:  Procedure Laterality Date   ABDOMINAL SURGERY     APPENDECTOMY  2005   BREAST BIOPSY Bilateral 1999   rt w/out clip - neg, lt w/clip - neg   BREAST CYST ASPIRATION Right 2003   neg   CHOLECYSTECTOMY  2004   COLONOSCOPY WITH PROPOFOL N/A 06/09/2016   Procedure: COLONOSCOPY WITH PROPOFOL;  Surgeon: Jonathon Bellows, MD;  Location: [facility];  Service: Endoscopy;  Laterality: N/A;   ESOPHAGOGASTRODUODENOSCOPY (EGD) WITH PROPOFOL N/A 06/09/2016   Procedure: ESOPHAGOGASTRODUODENOSCOPY (EGD) WITH PROPOFOL;  Surgeon: Jonathon Bellows, MD;  Location: [facility];  Service: Endoscopy;  Laterality: N/A;   EYE SURGERY     GIVENS CAPSULE STUDY N/A 06/23/2016   Procedure: GIVENS CAPSULE STUDY;  Surgeon: Jonathon Bellows, MD;  Location: [facility] ENDOSCOPY;  Service: Endoscopy;  Laterality: N/A;   PARS PLANA VITRECTOMY W/ REPAIR  OF MACULAR HOLE Left 01/2017   [facility]   TOTAL ABDOMINAL HYSTERECTOMY W/ BILATERAL SALPINGOOPHORECTOMY  1986    Current Medications: Current Meds  Medication Sig   ALPRAZolam (XANAX) 1 MG tablet TAKE 1 TABLET BY MOUTH THREE TIMES A DAY AS NEEDED   HYDROcodone-acetaminophen (NORCO/VICODIN) 5-325 MG tablet Take 1 tablet by mouth 3 (three) times daily as needed. Dx Code M79.7   ketoconazole (NIZORAL) 2 % cream Apply 1  application topically 2 (two) times daily as needed for irritation.   Menthol, Topical Analgesic, (BIOFREEZE EX) Apply 1 application  topically as needed.   methylphenidate (RITALIN) 10 MG tablet Take 1 tablet (10 mg total) by mouth 2 (two) times daily.   omeprazole (PRILOSEC) 20 MG capsule TAKE 1 CAPSULE BY MOUTH EVERY DAY   Polyethyl Glycol-Propyl Glycol (SYSTANE OP) Apply to eye as needed.   promethazine (PHENERGAN) 12.5 MG tablet Take 1-2 tablets (12.5-25 mg total) by mouth 3 (three) times daily as needed for nausea or vomiting.   SUMAtriptan (IMITREX) 50 MG tablet TAKE 0.5-1 TABLET BY MOUTH DAILY AS NEEDED FOR MIGRAINE.   triamcinolone cream (KENALOG) 0.1 % APPLY 1 APPLICATION TOPICALLY 2 (TWO) TIMES DAILY AS NEEDED.    Allergies:   Tizanidine, Aspirin, Buspirone hcl, Ciprofloxacin, Citalopram, Codeine, Diazepam, Duloxetine, Ibuprofen, Imipramine hcl, Oxycodone-acetaminophen, Pregabalin, Propoxyphene hcl, Risperidone, Sulfonamide derivatives, Tramadol hcl, Tramadol hcl, Venlafaxine, and Methocarbamol   Social History   Socioeconomic History   Marital status: Single    Spouse name: Not on file   Number of children: 1   Years of education: Not on file   Highest education level: Not on file  Occupational History   Occupation: DISABLED  Tobacco Use   Smoking status: Former    Packs/day: 1.00    Years: 40.00    Total pack years: 40.00    Types: Cigarettes    Quit date: 09/25/2013    Years since quitting: 8.2    Passive exposure: Never   Smokeless tobacco: Never   Tobacco comments:       Vaping Use   Vaping Use: Never used  Substance and Sexual Activity   Alcohol use: Not Currently    Alcohol/week: 0.0 standard drinks of alcohol    Comment: rarely   Drug use: No   Sexual activity: Not on file  Other Topics Concern   Not on file  Social History Narrative   Living alone again      No living will   Daughter should make health care decisions   Requests DNR---done 10/24/14    No tube feeds if cognitively unaware   Highest level of education: 9th   Social Determinants of Health   Financial Resource Strain: Not on file  Food Insecurity: Not on file  Transportation Needs: Not on file  Physical Activity: Not on file  Stress: Not on file  Social Connections: Not on file     Family History:  The patient's family history includes Arthritis/Rheumatoid in her sister; Breast cancer in her sister; COPD in her sister; Diabetes in her sister; Heart disease in her father, mother, and sister; Hypertension in her sister and sister; Stroke in her mother. There is no history of Kidney cancer, Kidney disease, or Prostate cancer.  ROS:   12-point review of systems is negative unless otherwise noted in the HPI.   EKGs/Labs/Other Studies Reviewed:    Studies reviewed were summarized above. The additional studies were reviewed today:  Zio patch 10/2021: Patient had a min HR of 47 bpm,  max HR of 171 bpm, and avg HR of 66 bpm. Predominant underlying rhythm was Sinus Rhythm. First Degree AV Block was present. Intermittent Bundle Branch Block was present. QRS morphology changes were present throughout  recording. 4 Supraventricular Tachycardia runs occurred, the run with the fastest interval lasting 4 beats with a max rate of 171 bpm, the longest lasting 13 beats with an avg rate of 94 bpm. Isolated SVEs were rare (<1.0%), SVE Couplets were rare  (<1.0%), and SVE Triplets were rare (<1.0%). Isolated VEs were rare (<1.0%, 219), VE Couplets were rare (<1.0%, 16), and VE Triplets were rare (<1.0%, 3).   Conclusion Occasional paroxysmal SVT noted Rare PACs Patient triggered events associated with PACs   EKG:  EKG is not ordered today given Zio patch.  Recent Labs: 10/08/2021: ALT 22; BUN 9; Creatinine, Ser 0.67; Hemoglobin 12.6; Platelets 309; Potassium 3.5; Sodium 140  Recent Lipid Panel    Component Value Date/Time   CHOL 231 (H) 03/08/2016 1243   TRIG 129.0 03/08/2016 1243    HDL 42.40 03/08/2016 1243   CHOLHDL 5 03/08/2016 1243   VLDL 25.8 03/08/2016 1243   LDLCALC 162 (H) 03/08/2016 1243   LDLDIRECT 153.7 10/10/2012 1139    PHYSICAL EXAM:    VS:  BP (!) 140/72 (BP Location: Right Arm, Patient Position: Sitting, Cuff Size: Normal)   Pulse 67   Ht '5\' 3"'$  (1.6 m)   Wt 106 lb 9.6 oz (48.4 kg)   SpO2 98%   BMI 18.88 kg/m   BMI: Body mass index is 18.88 kg/m.  Physical Exam Vitals reviewed.  Constitutional:      Appearance: She is well-developed.  HENT:     Head: Normocephalic and atraumatic.  Eyes:     General:        Right eye: No discharge.        Left eye: No discharge.  Neck:     Vascular: No JVD.  Cardiovascular:     Rate and Rhythm: Normal rate and regular rhythm.     Heart sounds: Normal heart sounds, S1 normal and S2 normal. Heart sounds not distant. No midsystolic click and no opening snap. No murmur heard.    No friction rub.  Pulmonary:     Effort: Pulmonary effort is normal. No respiratory distress.     Breath sounds: Normal breath sounds. No decreased breath sounds, wheezing or rales.  Chest:     Chest wall: No tenderness.  Abdominal:     General: There is no distension.  Musculoskeletal:     Cervical back: Normal range of motion.     Right lower leg: No edema.     Left lower leg: No edema.  Skin:    General: Skin is warm and dry.     Nails: There is no clubbing.  Neurological:     Mental Status: She is alert and oriented to person, place, and time.  Psychiatric:        Speech: Speech normal.        Behavior: Behavior normal.        Thought Content: Thought content normal.        Judgment: Judgment normal.     Wt Readings from Last 3 Encounters:  12/09/21 106 lb 9.6 oz (48.4 kg)  10/25/21 108 lb (49 kg)  10/18/21 107 lb (48.5 kg)     ASSESSMENT & PLAN:   PSVT/palpitations: Overall, PSVT burden is quite low and nonsustained.  Given this, and in the context of medication  intolerances, we will defer initiation of  standing beta-blocker or nondihydropyridine calcium channel blocker.  Obtain echo to evaluate for any structural abnormalities.  If symptoms worsen she will let our office know.  Coronary artery calcification/aortic atherosclerosis/HLD: No symptoms concerning for angina.  LDL 162 from 2017.  Defer escalation of medical therapy given multiple medication intolerances.  This was not discussed in detail at today's visit.  Follow-up with PCP/primary cardiologist.   Disposition: F/u with Dr. Garen Lah or an APP in 6 months.   Medication Adjustments/Labs and Tests Ordered: Current medicines are reviewed at length with the patient today.  Concerns regarding medicines are outlined above. Medication changes, Labs and Tests ordered today are summarized above and listed in the Patient Instructions accessible in Encounters.   Signed, Christell Faith, PA-C 12/09/2021 11:57 AM     [facility] [address] [place], [place] [address] [phone]

## 2021-12-09 ENCOUNTER — Ambulatory Visit: Payer: Medicare Other | Attending: Physician Assistant | Admitting: Physician Assistant

## 2021-12-09 ENCOUNTER — Encounter: Payer: Self-pay | Admitting: Physician Assistant

## 2021-12-09 VITALS — BP 140/72 | HR 67 | Ht 63.0 in | Wt 106.6 lb

## 2021-12-09 DIAGNOSIS — I471 Supraventricular tachycardia: Secondary | ICD-10-CM

## 2021-12-09 DIAGNOSIS — R002 Palpitations: Secondary | ICD-10-CM | POA: Diagnosis not present

## 2021-12-09 NOTE — Patient Instructions (Signed)
Medication Instructions:  Your physician recommends that you continue on your current medications as directed. Please refer to the Current Medication list given to you today.  *If you need a refill on your cardiac medications before your next appointment, please call your pharmacy*   Lab Work: None ordered  If you have labs (blood work) drawn today and your tests are completely normal, you will receive your results only by: [facility] (if you have MyChart) OR A paper copy in the mail If you have any lab test that is abnormal or we need to change your treatment, we will call you to review the results.   Testing/Procedures: Your physician has requested that you have an echocardiogram. Echocardiography is a painless test that uses sound waves to create images of your heart. It provides your doctor with information about the size and shape of your heart and how well your heart's chambers and valves are working. This procedure takes approximately one hour. There are no restrictions for this procedure.  Follow-Up: At [facility], you and your health needs are our priority.  As part of our continuing mission to provide you with exceptional heart care, we have created designated Provider Care Teams.  These Care Teams include your primary Cardiologist (physician) and Advanced Practice Providers (APPs -  Physician Assistants and Nurse Practitioners) who all work together to provide you with the care you need, when you need it.  We recommend signing up for the patient portal called "MyChart".  Sign up information is provided on this After Visit Summary.  MyChart is used to connect with patients for Virtual Visits (Telemedicine).  Patients are able to view lab/test results, encounter notes, upcoming appointments, etc.  Non-urgent messages can be sent to your provider as well.   To learn more about what you can do with MyChart, go to [email or web address].    Your next appointment:   6  month(s)  The format for your next appointment:   In Person  Provider:   You may see Kate Sable, MD or one of the following Advanced Practice Providers on your designated Care Team:   Murray Hodgkins, NP Christell Faith, PA-C Cadence Kathlen Mody, PA-C Gerrie Nordmann, NP   Important Information About Sugar

## 2021-12-28 ENCOUNTER — Ambulatory Visit: Payer: Medicare Other | Admitting: Internal Medicine

## 2021-12-29 ENCOUNTER — Ambulatory Visit: Payer: Medicare Other | Admitting: Internal Medicine

## 2022-01-13 ENCOUNTER — Ambulatory Visit: Payer: Medicare Other

## 2022-01-18 ENCOUNTER — Ambulatory Visit: Payer: Medicare Other | Attending: Physician Assistant

## 2022-01-31 ENCOUNTER — Other Ambulatory Visit: Payer: Self-pay | Admitting: Internal Medicine

## 2022-01-31 MED ORDER — ALPRAZOLAM 1 MG PO TABS: 1.0000 mg | ORAL_TABLET | Freq: Three times a day (TID) | ORAL | 0 refills | Status: DC | PRN

## 2022-01-31 MED ORDER — HYDROCODONE-ACETAMINOPHEN 5-325 MG PO TABS: 1.0000 | ORAL_TABLET | Freq: Three times a day (TID) | ORAL | 0 refills | Status: DC | PRN

## 2022-01-31 NOTE — Telephone Encounter (Signed)
Caller Name: Loraine  Call back phone #: [phone]  MEDICATION(S):  ALPRAZolam (XANAX) 1 MG tablet  HYDROcodone-acetaminophen (NORCO/VICODIN) 5-325 MG tablet   Days of Med Remaining: 2  Has the patient contacted their pharmacy (YES/NO)? NO What did pharmacy advise?   Preferred Pharmacy:  [id number]   Derrek Monaco advise patient/caregiver to allow 2-3 business days to process RX refills.    Pt is asking is Dr. Silvio Pate can send in a referral to a dr who can treat her fibro? Pt stated she can barely walk & sometimes she has to crawl to get to around her house.

## 2022-01-31 NOTE — Telephone Encounter (Signed)
Hydrocodone Last filled 11-16-21 #90 Last OV 10-18-21 Next OV 04-01-22 [facility]     Alprazolam last filled 11-29-21 #90

## 2022-02-14 ENCOUNTER — Ambulatory Visit (INDEPENDENT_AMBULATORY_CARE_PROVIDER_SITE_OTHER): Payer: Medicare Other | Admitting: Internal Medicine

## 2022-02-14 ENCOUNTER — Encounter: Payer: Self-pay | Admitting: Internal Medicine

## 2022-02-14 VITALS — BP 112/70 | HR 61 | Temp 97.3°F | Ht 63.0 in | Wt 104.0 lb

## 2022-02-14 DIAGNOSIS — F39 Unspecified mood [affective] disorder: Secondary | ICD-10-CM

## 2022-02-14 DIAGNOSIS — M797 Fibromyalgia: Secondary | ICD-10-CM

## 2022-02-14 MED ORDER — METHYLPHENIDATE HCL 20 MG PO TABS: 20.0000 mg | ORAL_TABLET | Freq: Two times a day (BID) | ORAL | 0 refills | Status: DC

## 2022-02-14 NOTE — Assessment & Plan Note (Signed)
Has worsened Will try increasing the ritalin since that helped her pain and energy Continue the hydrocodone prn Rx for rollator and shower chair as having trouble functioning

## 2022-02-14 NOTE — Progress Notes (Signed)
Subjective:    Patient ID: Lindsey Ward, female    DOB: [birth year], 71 y.o.   MRN: [id number]  HPI Here for follow up of fibromyalgia and mood problems With sister  Is back to spending most of the day in bed Had ESI by Dr Roanna Epley help Still in the study for fibromyalgia---had MRI and cognitive testing  Takes the hydrocodone sporadically ---takes too long to help and doesn't last Does help a little  The ritalin has stopped working for her It worked so well at [employer] it just stopped working [employer] once--but then had to go to bed Prepared foods/sandwiches Can do the laundry---doesn't really clean  Still gets palpitations---mostly with anxiety Xanax does help Twice a month or so  Needs a rollator to get around Will need the seat at times--when she tires Fatiguing in shower---needs shower chair  Current Outpatient Medications on File Prior to Visit  Medication Sig Dispense Refill   ALPRAZolam (XANAX) 1 MG tablet Take 1 tablet (1 mg total) by mouth 3 (three) times daily as needed. 90 tablet 0   HYDROcodone-acetaminophen (NORCO/VICODIN) 5-325 MG tablet Take 1 tablet by mouth 3 (three) times daily as needed. Dx Code M79.7 90 tablet 0   ketoconazole (NIZORAL) 2 % cream Apply 1 application topically 2 (two) times daily as needed for irritation. 60 g 1   Menthol, Topical Analgesic, (BIOFREEZE EX) Apply 1 application  topically as needed.     methylphenidate (RITALIN) 10 MG tablet Take 1 tablet (10 mg total) by mouth 2 (two) times daily. 60 tablet 0   omeprazole (PRILOSEC) 20 MG capsule TAKE 1 CAPSULE BY MOUTH EVERY DAY 90 capsule 3   Polyethyl Glycol-Propyl Glycol (SYSTANE OP) Apply to eye as needed.     promethazine (PHENERGAN) 12.5 MG tablet Take 1-2 tablets (12.5-25 mg total) by mouth 3 (three) times daily as needed for nausea or vomiting. 60 tablet 0   SUMAtriptan (IMITREX) 50 MG tablet TAKE 0.5-1 TABLET BY MOUTH DAILY AS NEEDED FOR MIGRAINE. 9 tablet  11   triamcinolone cream (KENALOG) 0.1 % APPLY 1 APPLICATION TOPICALLY 2 (TWO) TIMES DAILY AS NEEDED. 45 g 1   No current facility-administered medications on file prior to visit.    Allergies  Allergen Reactions   Tizanidine Other (See Comments)    "throws me in a different world"   Aspirin     REACTION: GI bleed   Buspirone Hcl     REACTION: unspecified   Ciprofloxacin     REACTION: unspecified   Citalopram Other (See Comments)    Ear ringing.    Codeine    Diazepam    Duloxetine     REACTION: sz like activity   Ibuprofen     REACTION: GI bleed   Imipramine Hcl Other (See Comments)    Ear ringing.    Oxycodone-Acetaminophen     REACTION: unspecified   Pregabalin     REACTION: kept her up and confusion   Propoxyphene Hcl    Risperidone     REACTION: confusion   Sulfonamide Derivatives     REACTION: unspecified   Tramadol Hcl     REACTION: doesn't remember what happened but couldn't tolerate   Tramadol Hcl Other (See Comments)    REACTION: doesn't remember what happened but couldn't tolerate REACTION: doesn't remember what happened but couldn't tolerate REACTION: doesn't remember what happened but couldn't tolerate    Venlafaxine     REACTION: unspecified   Methocarbamol Rash  Past Medical History:  Diagnosis Date   Bowel obstruction (HCC)    Chronic fatigue    Depression    Fibromyalgia    GERD (gastroesophageal reflux disease)    Hyperlipemia    Migraine    Obstipation    Osteopenia    PUD (peptic ulcer disease)     Past Surgical History:  Procedure Laterality Date   ABDOMINAL SURGERY     APPENDECTOMY  2005   BREAST BIOPSY Bilateral 1999   rt w/out clip - neg, lt w/clip - neg   BREAST CYST ASPIRATION Right 2003   neg   CHOLECYSTECTOMY  2004   COLONOSCOPY WITH PROPOFOL N/A 06/09/2016   Procedure: COLONOSCOPY WITH PROPOFOL;  Surgeon: Jonathon Bellows, MD;  Location: [facility];  Service: Endoscopy;  Laterality: N/A;   ESOPHAGOGASTRODUODENOSCOPY  (EGD) WITH PROPOFOL N/A 06/09/2016   Procedure: ESOPHAGOGASTRODUODENOSCOPY (EGD) WITH PROPOFOL;  Surgeon: Jonathon Bellows, MD;  Location: [facility];  Service: Endoscopy;  Laterality: N/A;   EYE SURGERY     GIVENS CAPSULE STUDY N/A 06/23/2016   Procedure: GIVENS CAPSULE STUDY;  Surgeon: Jonathon Bellows, MD;  Location: [facility];  Service: Endoscopy;  Laterality: N/A;   PARS PLANA VITRECTOMY W/ REPAIR OF MACULAR HOLE Left 01/2017   [facility]   TOTAL ABDOMINAL HYSTERECTOMY W/ BILATERAL SALPINGOOPHORECTOMY  1986    Family History  Problem Relation Age of Onset   Heart disease Mother        AMI   Stroke Mother    Heart disease Father    Hypertension Sister    Breast cancer Sister    Hypertension Sister    Heart disease Sister        MI   Diabetes Sister    Arthritis/Rheumatoid Sister    COPD Sister    Kidney cancer Neg Hx    Kidney disease Neg Hx    Prostate cancer Neg Hx     Social History   Socioeconomic History   Marital status: Single    Spouse name: Not on file   Number of children: 1   Years of education: Not on file   Highest education level: Not on file  Occupational History   Occupation: DISABLED  Tobacco Use   Smoking status: Former    Packs/day: 1.00    Years: 40.00    Total pack years: 40.00    Types: Cigarettes    Quit date: 09/25/2013    Years since quitting: 8.3    Passive exposure: Never   Smokeless tobacco: Never   Tobacco comments:       Vaping Use   Vaping Use: Never used  Substance and Sexual Activity   Alcohol use: Not Currently    Alcohol/week: 0.0 standard drinks of alcohol    Comment: rarely   Drug use: No   Sexual activity: Not on file  Other Topics Concern   Not on file  Social History Narrative   Living alone again      No living will   Daughter should make health care decisions   Requests DNR---done 10/24/14   No tube feeds if cognitively unaware   Highest level of education: 9th   Social Determinants of Health   Financial Resource  Strain: Not on file  Food Insecurity: Not on file  Transportation Needs: Not on file  Physical Activity: Not on file  Stress: Not on file  Social Connections: Not on file  Intimate Partner Violence: Not on file   Review of Systems Sleep is variable Appetite  is okay    Objective:   Physical Exam Constitutional:      Appearance: Normal appearance.  Cardiovascular:     Rate and Rhythm: Normal rate and regular rhythm.     Heart sounds: No murmur heard.    No gallop.  Pulmonary:     Effort: Pulmonary effort is normal.     Breath sounds: Normal breath sounds. No wheezing or rales.  Musculoskeletal:     Cervical back: Neck supple.  Lymphadenopathy:     Cervical: No cervical adenopathy.  Neurological:     Mental Status: She is alert.  Psychiatric:        Mood and Affect: Mood normal.        Behavior: Behavior normal.     Comments: No obvious depression            Assessment & Plan:

## 2022-02-14 NOTE — Assessment & Plan Note (Signed)
Mostly depressed due to pain and disability Will try increasing the methylphenidate

## 2022-02-22 ENCOUNTER — Telehealth: Payer: Self-pay

## 2022-02-22 ENCOUNTER — Ambulatory Visit (INDEPENDENT_AMBULATORY_CARE_PROVIDER_SITE_OTHER): Payer: Medicare Other | Admitting: Internal Medicine

## 2022-02-22 ENCOUNTER — Ambulatory Visit: Payer: Medicare Other | Admitting: Family Medicine

## 2022-02-22 ENCOUNTER — Encounter: Payer: Self-pay | Admitting: Internal Medicine

## 2022-02-22 VITALS — BP 136/72 | HR 66 | Temp 97.2°F | Ht 63.0 in | Wt 106.0 lb

## 2022-02-22 DIAGNOSIS — R350 Frequency of micturition: Secondary | ICD-10-CM

## 2022-02-22 DIAGNOSIS — M797 Fibromyalgia: Secondary | ICD-10-CM

## 2022-02-22 LAB — POC URINALSYSI DIPSTICK (AUTOMATED)
Bilirubin, UA: NEGATIVE
Blood, UA: NEGATIVE
Glucose, UA: NEGATIVE
Ketones, UA: NEGATIVE
Leukocytes, UA: NEGATIVE
Nitrite, UA: NEGATIVE
Protein, UA: NEGATIVE
Spec Grav, UA: <=1.005 — AB (ref 1.010–1.025)
Urobilinogen, UA: 0.2 E.U./dL
pH, UA: 6 (ref 5.0–8.0)

## 2022-02-22 MED ORDER — SAVELLA 12.5 MG PO TABS: 12.5000 mg | ORAL_TABLET | Freq: Two times a day (BID) | ORAL | 2 refills | Status: DC

## 2022-02-22 NOTE — Telephone Encounter (Signed)
Blanch Media (not on DPR) said pt wanted to know if prednisone had been called in.I spoke with pt. Pt was seen earlier today and pt said pharmacy was going to order the savella and that med would be in on 02/23/22 for pick up. Pt said the pharmacy did not have prednisone being sent in. Per office note on 02/22/22 Dr Silvio Pate had noted in office note that pt had been given prednisone in past but did not seem to help. Pt voiced understanding and will pick up savella on 02/23/22 and will let Dr Silvio Pate know how she does. Sending note to Dr Silvio Pate and Silvio Pate pool.

## 2022-02-22 NOTE — Telephone Encounter (Signed)
I did see her today and will cancel the appt for tomorrow

## 2022-02-22 NOTE — Progress Notes (Deleted)
   Subjective:    Patient ID: Lindsey Ward, female    DOB: 07/18/1950, 71 y.o.   MRN: [id number]  HPI 71 yo pt of Dr Alla German presents for c/o fibromyalgia pain     Last visit on 12/4 he inc her ritalin to help with pain and energy  Takes hydrocodone  Given px for rollator and shower chair She has seen rheumatology  Elevated ESR in past   Lab Results  Component Value Date   ESRSEDRATE 19 06/22/2021     She has been intol of many meds and many have not worked Record notes she resp briefly to steroids  Had depomedrol 40 mg IM ilast feb    Currently takes  Alprazolam Norco 5-325 mg tid prn  Ritalin 10 mg bid  Omeprazole  Imitrex for migraine   Has tried amitriptyline  Imipramine Citalopram  Oxycodone Lyrica  Tramadol Effexor  Methocabamol  Flexeril   Review of Systems     Objective:   Physical Exam        Assessment & Plan:

## 2022-02-22 NOTE — Telephone Encounter (Signed)
Pt said she was seen 02/14/22 and pt increased meds as instructed but not helping fibromyalgia pain. Pt having pain all over her body. Pt scheduled appt to see Dr Glori Bickers 02/22/22 at 9:30 with UC & ED precautions; sending note to Dr Glori Bickers and [employer].

## 2022-02-22 NOTE — Assessment & Plan Note (Signed)
Urinalysis negative Doesn't seem to have infection

## 2022-02-22 NOTE — Telephone Encounter (Signed)
I spoke with pt again to see if pt could wait for appt with Dr Silvio Pate on 02/23/22 but pt said when she woke up this morning the pain was from the top of her head down her neck and all over body. Pt taking hydrocodone apap 5-325 mg tid for pain and it is not taking care of pain and pt said she had to see someone today. Pt also wanted to keep appt on 02/23/22 incase needed more time to discuss pts pain. Pt added on 02/22/22 at 12:45 per Dr Silvio Pate. Cancelled appt with Dr Glori Bickers and UC & ED precautions given and pt voiced understanding. Sending note to Dr Silvio Pate and Silvio Pate pool.

## 2022-02-22 NOTE — Progress Notes (Signed)
Subjective:    Patient ID: Lindsey Ward, female    DOB: June 25, 1950, 71 y.o.   MRN: [id number]  HPI Here due to pain getting even worse  Increasing the ritalin hasn't helped In bed all day long Having right head and shoulder pain Pain in hips as well and down the sides of her legs  Chronic mood issues but doesn't feel overly depressed Mostly the pain Had been given prednisone in the past for PMR type syndrome--but doesn't seem to have helped Tried massage years ago----but it didn't help Gabapentin didn't help Acupunture hasn't worked either [employer] nerve pain in arms/legs--discussed----not sure it is nerves  Having some urinary frequency Just dribbles at times No pain  Does use the hydrocodone--brief mild relief only--has to increase to tid recently  Current Outpatient Medications on File Prior to Visit  Medication Sig Dispense Refill   ALPRAZolam (XANAX) 1 MG tablet Take 1 tablet (1 mg total) by mouth 3 (three) times daily as needed. 90 tablet 0   HYDROcodone-acetaminophen (NORCO/VICODIN) 5-325 MG tablet Take 1 tablet by mouth 3 (three) times daily as needed. Dx Code M79.7 90 tablet 0   ketoconazole (NIZORAL) 2 % cream Apply 1 application topically 2 (two) times daily as needed for irritation. 60 g 1   Menthol, Topical Analgesic, (BIOFREEZE EX) Apply 1 application  topically as needed.     methylphenidate (RITALIN) 20 MG tablet Take 1 tablet (20 mg total) by mouth 2 (two) times daily. 60 tablet 0   omeprazole (PRILOSEC) 20 MG capsule TAKE 1 CAPSULE BY MOUTH EVERY DAY 90 capsule 3   Polyethyl Glycol-Propyl Glycol (SYSTANE OP) Apply to eye as needed.     promethazine (PHENERGAN) 12.5 MG tablet Take 1-2 tablets (12.5-25 mg total) by mouth 3 (three) times daily as needed for nausea or vomiting. 60 tablet 0   SUMAtriptan (IMITREX) 50 MG tablet TAKE 0.5-1 TABLET BY MOUTH DAILY AS NEEDED FOR MIGRAINE. 9 tablet 11   triamcinolone cream (KENALOG) 0.1 % APPLY 1 APPLICATION TOPICALLY 2  (TWO) TIMES DAILY AS NEEDED. 45 g 1   No current facility-administered medications on file prior to visit.    Allergies  Allergen Reactions   Tizanidine Other (See Comments)    "throws me in a different world"   Aspirin     REACTION: GI bleed   Buspirone Hcl     REACTION: unspecified   Ciprofloxacin     REACTION: unspecified   Citalopram Other (See Comments)    Ear ringing.    Codeine    Diazepam    Duloxetine     REACTION: sz like activity   Ibuprofen     REACTION: GI bleed   Imipramine Hcl Other (See Comments)    Ear ringing.    Oxycodone-Acetaminophen     REACTION: unspecified   Pregabalin     REACTION: kept her up and confusion   Propoxyphene Hcl    Risperidone     REACTION: confusion   Sulfonamide Derivatives     REACTION: unspecified   Tramadol Hcl     REACTION: doesn't remember what happened but couldn't tolerate   Tramadol Hcl Other (See Comments)    REACTION: doesn't remember what happened but couldn't tolerate REACTION: doesn't remember what happened but couldn't tolerate REACTION: doesn't remember what happened but couldn't tolerate    Venlafaxine     REACTION: unspecified   Methocarbamol Rash    Past Medical History:  Diagnosis Date   Bowel obstruction ([place])  Chronic fatigue    Depression    Fibromyalgia    GERD (gastroesophageal reflux disease)    Hyperlipemia    Migraine    Obstipation    Osteopenia    PUD (peptic ulcer disease)     Past Surgical History:  Procedure Laterality Date   ABDOMINAL SURGERY     APPENDECTOMY  2005   BREAST BIOPSY Bilateral 1999   rt w/out clip - neg, lt w/clip - neg   BREAST CYST ASPIRATION Right 2003   neg   CHOLECYSTECTOMY  2004   COLONOSCOPY WITH PROPOFOL N/A 06/09/2016   Procedure: COLONOSCOPY WITH PROPOFOL;  Surgeon: Jonathon Bellows, MD;  Location: [facility];  Service: Endoscopy;  Laterality: N/A;   ESOPHAGOGASTRODUODENOSCOPY (EGD) WITH PROPOFOL N/A 06/09/2016   Procedure: ESOPHAGOGASTRODUODENOSCOPY  (EGD) WITH PROPOFOL;  Surgeon: Jonathon Bellows, MD;  Location: [facility];  Service: Endoscopy;  Laterality: N/A;   EYE SURGERY     GIVENS CAPSULE STUDY N/A 06/23/2016   Procedure: GIVENS CAPSULE STUDY;  Surgeon: Jonathon Bellows, MD;  Location: [facility];  Service: Endoscopy;  Laterality: N/A;   PARS PLANA VITRECTOMY W/ REPAIR OF MACULAR HOLE Left 01/2017   [facility]   TOTAL ABDOMINAL HYSTERECTOMY W/ BILATERAL SALPINGOOPHORECTOMY  1986    Family History  Problem Relation Age of Onset   Heart disease Mother        AMI   Stroke Mother    Heart disease Father    Hypertension Sister    Breast cancer Sister    Hypertension Sister    Heart disease Sister        MI   Diabetes Sister    Arthritis/Rheumatoid Sister    COPD Sister    Kidney cancer Neg Hx    Kidney disease Neg Hx    Prostate cancer Neg Hx     Social History   Socioeconomic History   Marital status: Single    Spouse name: Not on file   Number of children: 1   Years of education: Not on file   Highest education level: Not on file  Occupational History   Occupation: DISABLED  Tobacco Use   Smoking status: Former    Packs/day: 1.00    Years: 40.00    Total pack years: 40.00    Types: Cigarettes    Quit date: 09/25/2013    Years since quitting: 8.4    Passive exposure: Never   Smokeless tobacco: Never   Tobacco comments:       Vaping Use   Vaping Use: Never used  Substance and Sexual Activity   Alcohol use: Not Currently    Alcohol/week: 0.0 standard drinks of alcohol    Comment: rarely   Drug use: No   Sexual activity: Not on file  Other Topics Concern   Not on file  Social History Narrative   Living alone again      No living will   Daughter should make health care decisions   Requests DNR---done 10/24/14   No tube feeds if cognitively unaware   Highest level of education: 9th   Social Determinants of Health   Financial Resource Strain: Not on file  Food Insecurity: Not on file  Transportation Needs:  Not on file  Physical Activity: Not on file  Stress: Not on file  Social Connections: Not on file  Intimate Partner Violence: Not on file   Review of Systems Sleeps okay with xanax Appetite is not good--hasn't lost weight    Objective:   Physical Exam  Constitutional:      Appearance: Normal appearance.  Neurological:     Mental Status: She is alert.     Comments: Gait is normal No focal weakness  Psychiatric:        Mood and Affect: Mood normal.            Assessment & Plan:

## 2022-02-22 NOTE — Assessment & Plan Note (Signed)
Still severe --- had been taking too much of the ritalin (40 bid)---discussed it should be '20mg'$  bid Reviewed options for Rx--not much left Continues on xanax and she sleeps okay on this Will try savella 12.'5mg'$  bid----delay titration due to her intolerance for multiple meds  Will again check CBC and sed rate--since she has PMR type syndrome

## 2022-02-22 NOTE — Patient Instructions (Signed)
GO back to just '20mg'$  twice a day with the ritalin. Start the new medication savella at 12.5 daily for 3 days, then increase to 12.'5mg'$  twice a day. Let me know if you have any problems with it.

## 2022-02-23 ENCOUNTER — Ambulatory Visit: Payer: Medicare Other | Admitting: Internal Medicine

## 2022-02-23 LAB — CBC
HCT: 34.5 % — ABNORMAL LOW (ref 36.0–46.0)
Hemoglobin: 12 g/dL (ref 12.0–15.0)
MCHC: 34.8 g/dL (ref 30.0–36.0)
MCV: 92 fl (ref 78.0–100.0)
Platelets: 293 10*3/uL (ref 150.0–400.0)
RBC: 3.74 Mil/uL — ABNORMAL LOW (ref 3.87–5.11)
RDW: 12.7 % (ref 11.5–15.5)
WBC: 7.2 10*3/uL (ref 4.0–10.5)

## 2022-02-23 LAB — SEDIMENTATION RATE: Sed Rate: 12 mm/hr (ref 0–30)

## 2022-02-23 NOTE — Telephone Encounter (Signed)
Spoke to pt. She remembered that when I told her.

## 2022-03-02 ENCOUNTER — Telehealth: Payer: Self-pay | Admitting: Internal Medicine

## 2022-03-02 NOTE — Telephone Encounter (Signed)
Patients called in stating the she thinks since starting medicationMilnacipran HCl (SAVELLA) 12.5 MG TABS   is causing her heart to race and to not have any energy. She stated that these symptoms started when she began this medication.

## 2022-03-02 NOTE — Telephone Encounter (Signed)
Spoke to pt. She will stop the [facility] as she is scared to take with the rapid heart rate. Made her an appt 03-10-22 to discuss other options.

## 2022-03-10 ENCOUNTER — Ambulatory Visit (INDEPENDENT_AMBULATORY_CARE_PROVIDER_SITE_OTHER): Payer: Medicare Other | Admitting: Internal Medicine

## 2022-03-10 ENCOUNTER — Encounter: Payer: Self-pay | Admitting: Internal Medicine

## 2022-03-10 VITALS — BP 102/68 | HR 68 | Temp 97.3°F | Ht 63.0 in | Wt 102.0 lb

## 2022-03-10 DIAGNOSIS — M7062 Trochanteric bursitis, left hip: Secondary | ICD-10-CM | POA: Insufficient documentation

## 2022-03-10 DIAGNOSIS — M797 Fibromyalgia: Secondary | ICD-10-CM

## 2022-03-10 NOTE — Patient Instructions (Signed)
Please try icing the painful area on the left hip for 10 minutes every few hours. You can also use topical diclofenac (over the counter) up to three times a day

## 2022-03-10 NOTE — Assessment & Plan Note (Signed)
Has not tolerated pretty much everything Discussed alternative Rx Can use the hydrocodone up to three times a day

## 2022-03-10 NOTE — Progress Notes (Signed)
Subjective:    Patient ID: Lindsey Ward, female    DOB: 1950/05/18, 71 y.o.   MRN: [id number]  HPI Here due to ongoing fibromyalgia symptoms  She didn't tolerate the savella---felt really nervous, then had more pain, then depressed Stopped methylphenidate as well--she didn't think it was helping either  Now having left hip pain Runs down to knee Has to sit away from that side  Did have acupuncture with a chiropractor ---for neck. Didn't help Hasn't tried massage/aroma  Hydrocodone does help some Needs to use it three times a day some days  Current Outpatient Medications on File Prior to Visit  Medication Sig Dispense Refill   ALPRAZolam (XANAX) 1 MG tablet Take 1 tablet (1 mg total) by mouth 3 (three) times daily as needed. 90 tablet 0   HYDROcodone-acetaminophen (NORCO/VICODIN) 5-325 MG tablet Take 1 tablet by mouth 3 (three) times daily as needed. Dx Code M79.7 90 tablet 0   ketoconazole (NIZORAL) 2 % cream Apply 1 application topically 2 (two) times daily as needed for irritation. 60 g 1   Menthol, Topical Analgesic, (BIOFREEZE EX) Apply 1 application  topically as needed.     omeprazole (PRILOSEC) 20 MG capsule TAKE 1 CAPSULE BY MOUTH EVERY DAY 90 capsule 3   Polyethyl Glycol-Propyl Glycol (SYSTANE OP) Apply to eye as needed.     promethazine (PHENERGAN) 12.5 MG tablet Take 1-2 tablets (12.5-25 mg total) by mouth 3 (three) times daily as needed for nausea or vomiting. 60 tablet 0   SUMAtriptan (IMITREX) 50 MG tablet TAKE 0.5-1 TABLET BY MOUTH DAILY AS NEEDED FOR MIGRAINE. 9 tablet 11   triamcinolone cream (KENALOG) 0.1 % APPLY 1 APPLICATION TOPICALLY 2 (TWO) TIMES DAILY AS NEEDED. 45 g 1   No current facility-administered medications on file prior to visit.    Allergies  Allergen Reactions   Tizanidine Other (See Comments)    "throws me in a different world"   Aspirin     REACTION: GI bleed   Buspirone Hcl     REACTION: unspecified   Ciprofloxacin     REACTION:  unspecified   Citalopram Other (See Comments)    Ear ringing.    Codeine    Diazepam    Duloxetine     REACTION: sz like activity   Ibuprofen     REACTION: GI bleed   Imipramine Hcl Other (See Comments)    Ear ringing.    Oxycodone-Acetaminophen     REACTION: unspecified   Pregabalin     REACTION: kept her up and confusion   Propoxyphene Hcl    Risperidone     REACTION: confusion   Sulfonamide Derivatives     REACTION: unspecified   Tramadol Hcl     REACTION: doesn't remember what happened but couldn't tolerate   Tramadol Hcl Other (See Comments)    REACTION: doesn't remember what happened but couldn't tolerate REACTION: doesn't remember what happened but couldn't tolerate REACTION: doesn't remember what happened but couldn't tolerate    Venlafaxine     REACTION: unspecified   Methocarbamol Rash   Savella [Milnacipran Hcl] Palpitations and Other (See Comments)    Dizziness    Past Medical History:  Diagnosis Date   Bowel obstruction ([facility])    Chronic fatigue    Depression    Fibromyalgia    GERD (gastroesophageal reflux disease)    Hyperlipemia    Migraine    Obstipation    Osteopenia    PUD (peptic ulcer disease)  Past Surgical History:  Procedure Laterality Date   ABDOMINAL SURGERY     APPENDECTOMY  2005   BREAST BIOPSY Bilateral 1999   rt w/out clip - neg, lt w/clip - neg   BREAST CYST ASPIRATION Right 2003   neg   CHOLECYSTECTOMY  2004   COLONOSCOPY WITH PROPOFOL N/A 06/09/2016   Procedure: COLONOSCOPY WITH PROPOFOL;  Surgeon: Jonathon Bellows, MD;  Location: [facility];  Service: Endoscopy;  Laterality: N/A;   ESOPHAGOGASTRODUODENOSCOPY (EGD) WITH PROPOFOL N/A 06/09/2016   Procedure: ESOPHAGOGASTRODUODENOSCOPY (EGD) WITH PROPOFOL;  Surgeon: Jonathon Bellows, MD;  Location: [facility];  Service: Endoscopy;  Laterality: N/A;   EYE SURGERY     GIVENS CAPSULE STUDY N/A 06/23/2016   Procedure: GIVENS CAPSULE STUDY;  Surgeon: Jonathon Bellows, MD;  Location: [facility]  ENDOSCOPY;  Service: Endoscopy;  Laterality: N/A;   PARS PLANA VITRECTOMY W/ REPAIR OF MACULAR HOLE Left 01/2017   [facility]   TOTAL ABDOMINAL HYSTERECTOMY W/ BILATERAL SALPINGOOPHORECTOMY  1986    Family History  Problem Relation Age of Onset   Heart disease Mother        AMI   Stroke Mother    Heart disease Father    Hypertension Sister    Breast cancer Sister    Hypertension Sister    Heart disease Sister        MI   Diabetes Sister    Arthritis/Rheumatoid Sister    COPD Sister    Kidney cancer Neg Hx    Kidney disease Neg Hx    Prostate cancer Neg Hx     Social History   Socioeconomic History   Marital status: Single    Spouse name: Not on file   Number of children: 1   Years of education: Not on file   Highest education level: Not on file  Occupational History   Occupation: DISABLED  Tobacco Use   Smoking status: Former    Packs/day: 1.00    Years: 40.00    Total pack years: 40.00    Types: Cigarettes    Quit date: 09/25/2013    Years since quitting: 8.4    Passive exposure: Never   Smokeless tobacco: Never   Tobacco comments:       Vaping Use   Vaping Use: Never used  Substance and Sexual Activity   Alcohol use: Not Currently    Alcohol/week: 0.0 standard drinks of alcohol    Comment: rarely   Drug use: No   Sexual activity: Not on file  Other Topics Concern   Not on file  Social History Narrative   Living alone again      No living will   Daughter should make health care decisions   Requests DNR---done 10/24/14   No tube feeds if cognitively unaware   Highest level of education: 9th   Social Determinants of Health   Financial Resource Strain: Not on file  Food Insecurity: Not on file  Transportation Needs: Not on file  Physical Activity: Not on file  Stress: Not on file  Social Connections: Not on file  Intimate Partner Violence: Not on file   Review of Systems Sleeps okay with the xanax Not eating great     Objective:   Physical  Exam Constitutional:      Appearance: Normal appearance.  Musculoskeletal:     Comments: Tenderness over left trochanteric bursa Normal ROM in both hips  Neurological:     Mental Status: She is alert.     Comments: Gait slow but  normal            Assessment & Plan:

## 2022-03-10 NOTE — Assessment & Plan Note (Signed)
Discussed intermittent ice and topical diclofenac

## 2022-03-15 ENCOUNTER — Ambulatory Visit: Payer: Medicare Other | Admitting: Dermatology

## 2022-03-23 ENCOUNTER — Other Ambulatory Visit: Payer: Medicare Other

## 2022-03-28 ENCOUNTER — Other Ambulatory Visit: Payer: Self-pay | Admitting: Internal Medicine

## 2022-03-28 MED ORDER — HYDROCODONE-ACETAMINOPHEN 5-325 MG PO TABS: 1.0000 | ORAL_TABLET | Freq: Three times a day (TID) | ORAL | 0 refills | Status: DC | PRN

## 2022-03-28 MED ORDER — ALPRAZOLAM 1 MG PO TABS: 1.0000 mg | ORAL_TABLET | Freq: Three times a day (TID) | ORAL | 0 refills | Status: DC | PRN

## 2022-03-28 NOTE — Telephone Encounter (Signed)
Let her know I am not sure what the next step is---we discussed alternative therapy and she should consider that (massage, aromatherapy ,etc)

## 2022-03-28 NOTE — Telephone Encounter (Signed)
Spoke to pt. She said she is in a lot of pain from head to toe. Cannot get out of bed due to the pain.

## 2022-03-28 NOTE — Telephone Encounter (Signed)
Spoke to pt. She said she will try an alternative.

## 2022-03-28 NOTE — Telephone Encounter (Signed)
Prescription Request  03/28/2022  Is this a "Controlled Substance" medicine? No  LOV: 03/10/2022  What is the name of the medication or equipment? HYDROcodone-acetaminophen (NORCO/VICODIN) 5-325 MG tablet & ALPRAZolam (XANAX) 1 MG tablet   Have you contacted your pharmacy to request a refill? No   Which pharmacy would you like this sent to?  [id number] [place] [place]Phone: [phone]: [phone]    Patient notified that their request is being sent to the clinical staff for review and that they should receive a response within 2 business days.   Please advise at Mobile [phone](mobile)  Pt stated she wanted to let [place] know she is currently in the bed, not feeling so well.

## 2022-03-31 ENCOUNTER — Telehealth: Payer: Self-pay | Admitting: Internal Medicine

## 2022-03-31 DIAGNOSIS — M797 Fibromyalgia: Secondary | ICD-10-CM

## 2022-03-31 NOTE — Telephone Encounter (Signed)
Patient called and asked if she can get a referral for fibromyalgia at [facility] Dr. Winfield Rast phone [phone].

## 2022-03-31 NOTE — Telephone Encounter (Signed)
Spoke to pt. She said that her sister sees Dr Winfield Rast and he told her that he would see her for fibromyalgia with a referral.

## 2022-03-31 NOTE — Telephone Encounter (Signed)
Spoke to pt.

## 2022-04-01 ENCOUNTER — Encounter: Payer: Self-pay | Admitting: *Deleted

## 2022-04-01 ENCOUNTER — Encounter: Payer: Medicare Other | Admitting: Internal Medicine

## 2022-04-05 ENCOUNTER — Telehealth: Payer: Self-pay

## 2022-04-05 NOTE — Telephone Encounter (Signed)
-----  Message from Venia Carbon, MD sent at 04/05/2022  8:30 AM EST ----- Correct There are not ANY rheumtologists that will see people for fibromyalgia. I told her that but she insisted  ----- Message ----- From: Pilar Grammes, CMA Sent: 04/04/2022   4:52 PM EST To: Venia Carbon, MD  I believe the request was to a different Dr in the practice. I assume since she saw Dr Posey Pronto, they will not let her see another provider there? ----- Message ----- From: Venia Carbon, MD Sent: 04/04/2022   4:21 PM EST To: Pilar Grammes, CMA  Please let her know that Dr Posey Pronto refused the visit ----not seeing fibromyalgia (I didn't think so) ----- Message ----- From: Pilar Grammes, CMA Sent: 04/04/2022  12:22 PM EST To: Venia Carbon, MD   ----- Message ----- From: Rosiland Oz Sent: 04/04/2022  12:14 PM EST To: Kathlyn [place]

## 2022-04-05 NOTE — Telephone Encounter (Signed)
Left message on VM for pt advising her that they said they are not seeing Fibromyalgia patients and that since has already seen Dr Posey Pronto there, that she could not see another provider in that practice.

## 2022-04-05 NOTE — Telephone Encounter (Signed)
Pt called back. She is asking what else can she do? Does she need to see physical medicine, ortho, something else.   I asked if she thought she could get to [facility] or [place]. She said she is not sure if she can go that far.

## 2022-04-20 DIAGNOSIS — H35373 Puckering of macula, bilateral: Secondary | ICD-10-CM | POA: Diagnosis not present

## 2022-04-21 ENCOUNTER — Ambulatory Visit (INDEPENDENT_AMBULATORY_CARE_PROVIDER_SITE_OTHER): Payer: Medicare Other | Admitting: Internal Medicine

## 2022-04-21 ENCOUNTER — Encounter: Payer: Self-pay | Admitting: Internal Medicine

## 2022-04-21 VITALS — BP 118/60 | HR 69 | Temp 97.2°F | Ht 62.5 in | Wt 103.0 lb

## 2022-04-21 DIAGNOSIS — Z0000 Encounter for general adult medical examination without abnormal findings: Secondary | ICD-10-CM | POA: Diagnosis not present

## 2022-04-21 DIAGNOSIS — G894 Chronic pain syndrome: Secondary | ICD-10-CM | POA: Diagnosis not present

## 2022-04-21 DIAGNOSIS — G43009 Migraine without aura, not intractable, without status migrainosus: Secondary | ICD-10-CM

## 2022-04-21 DIAGNOSIS — F39 Unspecified mood [affective] disorder: Secondary | ICD-10-CM | POA: Diagnosis not present

## 2022-04-21 DIAGNOSIS — F1320 Sedative, hypnotic or anxiolytic dependence, uncomplicated: Secondary | ICD-10-CM | POA: Diagnosis not present

## 2022-04-21 DIAGNOSIS — I700 Atherosclerosis of aorta: Secondary | ICD-10-CM | POA: Diagnosis not present

## 2022-04-21 DIAGNOSIS — K219 Gastro-esophageal reflux disease without esophagitis: Secondary | ICD-10-CM | POA: Diagnosis not present

## 2022-04-21 DIAGNOSIS — F1120 Opioid dependence, uncomplicated: Secondary | ICD-10-CM

## 2022-04-21 DIAGNOSIS — E441 Mild protein-calorie malnutrition: Secondary | ICD-10-CM | POA: Insufficient documentation

## 2022-04-21 NOTE — Assessment & Plan Note (Signed)
Imitrex helps but rarely needs

## 2022-04-21 NOTE — Assessment & Plan Note (Signed)
Uses the hydrocodone 1-3 times a day PDMP reviewed--no concerns

## 2022-04-21 NOTE — Assessment & Plan Note (Signed)
Mostly from fibromyalgia---now multifactorial and complex Will refer to pain management--Dr Dossie Arbour

## 2022-04-21 NOTE — Assessment & Plan Note (Signed)
PDMP reviewed No concerns 

## 2022-04-21 NOTE — Progress Notes (Signed)
Hearing Screening - Comments:: Passed whisper test Vision Screening - Comments:: February 2024  

## 2022-04-21 NOTE — Assessment & Plan Note (Signed)
Mostly bad anxiety/panic and reactive depression Failed multiple meds Now uses xanax-- mostly just at bedtime

## 2022-04-21 NOTE — Assessment & Plan Note (Signed)
I have personally reviewed the Medicare Annual Wellness questionnaire and have noted 1. The patient's medical and social history 2. Their use of alcohol, tobacco or illicit drugs 3. Their current medications and supplements 4. The patient's functional ability including ADL's, fall risks, home safety risks and hearing or visual             impairment. 5. Diet and physical activities 6. Evidence for depression or mood disorders  The patients weight, height, BMI and visual acuity have been recorded in the chart I have made referrals, counseling and provided education to the patient based review of the above and I have provided the pt with a written personalized care plan for preventive services.  I have provided you with a copy of your personalized plan for preventive services. Please take the time to review along with your updated medication list.  Colon due 2028--will reconsider then Yearly mammogram for now--?stop at 73? No pap due to age Prefers no COVID vaccine Needs Td at pharmacy Consider shingrix/RSV Not able to exercise

## 2022-04-21 NOTE — Assessment & Plan Note (Signed)
Uses prilosec daily again due to worsening

## 2022-04-21 NOTE — Progress Notes (Signed)
Subjective:    Patient ID: Lindsey Ward, female    DOB: 1951/02/10, 72 y.o.   MRN: [id number]  HPI Here for Medicare wellness visit and follow up of chronic health conditions Reviewed advanced directives Reviewed other doctors----[facility], [facility]--physiatry (done with them), Dr Agbor-Etang--cardiology, Dr Abner Greenspan, Dr Anthonette Legato (but hasn't been seen--missed appt) No hospitalizations or surgery this year Vision is okay Hearing okay No alcohol or tobacco One fall--tripped. No injury Mild mood issues No exercise Independent with instrumental ADLs Mild memory issues---trouble due to being alone so much  Has ongoing pain issues Ready for referral to pain management Continues on the hydrocodone--has needed 1-3 tabs a day  Ongoing anxiety and nerves Mostly due to pain Some reactive depression---spending a lot of time in bed No thoughts of dying or suicide  Eats okay Has just lost weight  No cough or SOB Does get some panic attacks--uses the xanax for that (with palpitations) --not often in day though Never got the scheduled echo No chest pain No dizziness or syncope Sleeps okay generally when she takes xanax  Current Outpatient Medications on File Prior to Visit  Medication Sig Dispense Refill   ALPRAZolam (XANAX) 1 MG tablet Take 1 tablet (1 mg total) by mouth 3 (three) times daily as needed. 90 tablet 0   HYDROcodone-acetaminophen (NORCO/VICODIN) 5-325 MG tablet Take 1 tablet by mouth 3 (three) times daily as needed. Dx Code M79.7 90 tablet 0   Menthol, Topical Analgesic, (BIOFREEZE EX) Apply 1 application  topically as needed.     omeprazole (PRILOSEC) 20 MG capsule TAKE 1 CAPSULE BY MOUTH EVERY DAY 90 capsule 3   Polyethyl Glycol-Propyl Glycol (SYSTANE OP) Apply to eye as needed.     promethazine (PHENERGAN) 12.5 MG tablet Take 1-2 tablets (12.5-25 mg total) by mouth 3 (three) times daily as needed for nausea or vomiting. 60 tablet 0   SUMAtriptan  (IMITREX) 50 MG tablet TAKE 0.5-1 TABLET BY MOUTH DAILY AS NEEDED FOR MIGRAINE. 9 tablet 11   No current facility-administered medications on file prior to visit.    Allergies  Allergen Reactions   Tizanidine Other (See Comments)    "throws me in a different world"   Aspirin     REACTION: GI bleed   Buspirone Hcl     REACTION: unspecified   Ciprofloxacin     REACTION: unspecified   Citalopram Other (See Comments)    Ear ringing.    Codeine    Diazepam    Duloxetine     REACTION: sz like activity   Ibuprofen     REACTION: GI bleed   Imipramine Hcl Other (See Comments)    Ear ringing.    Oxycodone-Acetaminophen     REACTION: unspecified   Pregabalin     REACTION: kept her up and confusion   Propoxyphene Hcl    Risperidone     REACTION: confusion   Sulfonamide Derivatives     REACTION: unspecified   Tramadol Hcl     REACTION: doesn't remember what happened but couldn't tolerate   Tramadol Hcl Other (See Comments)    REACTION: doesn't remember what happened but couldn't tolerate REACTION: doesn't remember what happened but couldn't tolerate REACTION: doesn't remember what happened but couldn't tolerate    Venlafaxine     REACTION: unspecified   Methocarbamol Rash   Savella [Milnacipran Hcl] Palpitations and Other (See Comments)    Dizziness    Past Medical History:  Diagnosis Date   Bowel obstruction ([place])  Chronic fatigue    Depression    Fibromyalgia    GERD (gastroesophageal reflux disease)    Hyperlipemia    Migraine    Obstipation    Osteopenia    PUD (peptic ulcer disease)     Past Surgical History:  Procedure Laterality Date   ABDOMINAL SURGERY     APPENDECTOMY  2005   BREAST BIOPSY Bilateral 1999   rt w/out clip - neg, lt w/clip - neg   BREAST CYST ASPIRATION Right 2003   neg   CHOLECYSTECTOMY  2004   COLONOSCOPY WITH PROPOFOL N/A 06/09/2016   Procedure: COLONOSCOPY WITH PROPOFOL;  Surgeon: Jonathon Bellows, MD;  Location: [facility] ENDOSCOPY;   Service: Endoscopy;  Laterality: N/A;   ESOPHAGOGASTRODUODENOSCOPY (EGD) WITH PROPOFOL N/A 06/09/2016   Procedure: ESOPHAGOGASTRODUODENOSCOPY (EGD) WITH PROPOFOL;  Surgeon: Jonathon Bellows, MD;  Location: [facility] ENDOSCOPY;  Service: Endoscopy;  Laterality: N/A;   EYE SURGERY     GIVENS CAPSULE STUDY N/A 06/23/2016   Procedure: GIVENS CAPSULE STUDY;  Surgeon: Jonathon Bellows, MD;  Location: [facility] ENDOSCOPY;  Service: Endoscopy;  Laterality: N/A;   PARS PLANA VITRECTOMY W/ REPAIR OF MACULAR HOLE Left 01/2017   [facility]   TOTAL ABDOMINAL HYSTERECTOMY W/ BILATERAL SALPINGOOPHORECTOMY  1986    Family History  Problem Relation Age of Onset   Heart disease Mother        AMI   Stroke Mother    Heart disease Father    Hypertension Sister    Breast cancer Sister    Hypertension Sister    Heart disease Sister        MI   Diabetes Sister    Arthritis/Rheumatoid Sister    COPD Sister    Kidney cancer Neg Hx    Kidney disease Neg Hx    Prostate cancer Neg Hx     Social History   Socioeconomic History   Marital status: Single    Spouse name: Not on file   Number of children: 1   Years of education: Not on file   Highest education level: Not on file  Occupational History   Occupation: DISABLED  Tobacco Use   Smoking status: Former    Packs/day: 1.00    Years: 40.00    Total pack years: 40.00    Types: Cigarettes    Quit date: 09/25/2013    Years since quitting: 8.5    Passive exposure: Never   Smokeless tobacco: Never   Tobacco comments:       Vaping Use   Vaping Use: Never used  Substance and Sexual Activity   Alcohol use: Not Currently    Alcohol/week: 0.0 standard drinks of alcohol    Comment: rarely   Drug use: No   Sexual activity: Not on file  Other Topics Concern   Not on file  Social History Narrative   Living alone again      No living will   Daughter should make health care decisions   Requests DNR---done 10/24/14   No tube feeds if cognitively unaware   Highest level of  education: 9th   Social Determinants of Health   Financial Resource Strain: Not on file  Food Insecurity: Not on file  Transportation Needs: Not on file  Physical Activity: Not on file  Stress: Not on file  Social Connections: Not on file  Intimate Partner Violence: Not on file   Review of Systems Teeth okay--sees dentist Wears seat belt Has some spots on face and chest to be checked No heartburn  or dysphagia Bowels move fine---no blood Gets urinary frequency---bad odor at times No sig joint pains--seems to be mostly muscle     Objective:   Physical Exam Constitutional:      Comments: Mild wasting  HENT:     Mouth/Throat:     Pharynx: No oropharyngeal exudate or posterior oropharyngeal erythema.  Eyes:     Conjunctiva/sclera: Conjunctivae normal.     Pupils: Pupils are equal, round, and reactive to light.  Cardiovascular:     Rate and Rhythm: Normal rate and regular rhythm.     Pulses: Normal pulses.     Heart sounds: No murmur heard.    No gallop.  Pulmonary:     Effort: Pulmonary effort is normal.     Breath sounds: Normal breath sounds. No wheezing or rales.  Abdominal:     Palpations: Abdomen is soft.     Tenderness: There is no abdominal tenderness.  Musculoskeletal:     Cervical back: Neck supple.     Right lower leg: No edema.     Left lower leg: No edema.  Lymphadenopathy:     Cervical: No cervical adenopathy.  Skin:    Findings: No rash.  Neurological:     General: No focal deficit present.     Mental Status: She is alert and oriented to person, place, and time.     Comments: Work naming 7/1 minute Recall 2/3  Psychiatric:        Mood and Affect: Mood normal.        Behavior: Behavior normal.            Assessment & Plan:

## 2022-04-21 NOTE — Assessment & Plan Note (Signed)
Has lost 20# in past year May be stabilizing  Seems to be from pain, etc Discussed supplements like boost

## 2022-04-21 NOTE — Assessment & Plan Note (Signed)
On imaging Would not try statin due to her ongoing muscle pain

## 2022-04-22 LAB — COMPREHENSIVE METABOLIC PANEL
ALT: 12 U/L (ref 0–35)
AST: 25 U/L (ref 0–37)
Albumin: 4.1 g/dL (ref 3.5–5.2)
Alkaline Phosphatase: 87 U/L (ref 39–117)
BUN: 16 mg/dL (ref 6–23)
CO2: 29 mEq/L (ref 19–32)
Calcium: 9.5 mg/dL (ref 8.4–10.5)
Chloride: 102 mEq/L (ref 96–112)
Creatinine, Ser: 0.69 mg/dL (ref 0.40–1.20)
GFR: 85.96 mL/min (ref >60.00)
Glucose, Bld: 94 mg/dL (ref 70–99)
Potassium: 3.9 mEq/L (ref 3.5–5.1)
Sodium: 139 mEq/L (ref 135–145)
Total Bilirubin: 0.4 mg/dL (ref 0.2–1.2)
Total Protein: 6.3 g/dL (ref 6.0–8.3)

## 2022-04-22 LAB — CBC
HCT: 35.8 % — ABNORMAL LOW (ref 36.0–46.0)
Hemoglobin: 12.4 g/dL (ref 12.0–15.0)
MCHC: 34.7 g/dL (ref 30.0–36.0)
MCV: 92.8 fl (ref 78.0–100.0)
Platelets: 298 10*3/uL (ref 150.0–400.0)
RBC: 3.86 Mil/uL — ABNORMAL LOW (ref 3.87–5.11)
RDW: 13 % (ref 11.5–15.5)
WBC: 6.6 10*3/uL (ref 4.0–10.5)

## 2022-04-22 LAB — VITAMIN B12: Vitamin B-12: 290 pg/mL (ref 211–911)

## 2022-04-22 LAB — SEDIMENTATION RATE: Sed Rate: 8 mm/hr (ref 0–30)

## 2022-04-22 LAB — VITAMIN D 25 HYDROXY (VIT D DEFICIENCY, FRACTURES): VITD: 24.9 ng/mL — ABNORMAL LOW (ref 30.00–100.00)

## 2022-04-22 LAB — TSH: TSH: 1.44 u[IU]/mL (ref 0.35–5.50)

## 2022-04-29 ENCOUNTER — Telehealth: Payer: Self-pay | Admitting: Internal Medicine

## 2022-04-29 NOTE — Telephone Encounter (Signed)
Spoke to pt. She is asking if she should try taking Savella since she is off of Ritalin. Pt is aware it will be Monday before she hears back.

## 2022-04-29 NOTE — Telephone Encounter (Signed)
Patient called in and had some questions regarding medication. Please advise. Thank you!

## 2022-05-04 NOTE — Telephone Encounter (Signed)
Called and left message on VM to call back.

## 2022-05-05 DIAGNOSIS — H02052 Trichiasis without entropian right lower eyelid: Secondary | ICD-10-CM | POA: Diagnosis not present

## 2022-05-05 DIAGNOSIS — M3501 Sicca syndrome with keratoconjunctivitis: Secondary | ICD-10-CM | POA: Diagnosis not present

## 2022-05-06 NOTE — Telephone Encounter (Signed)
Spoke to pt. She has been at her sister's house. She will try the [facility] by itself to see how she does.

## 2022-05-12 ENCOUNTER — Other Ambulatory Visit: Payer: Self-pay | Admitting: Internal Medicine

## 2022-05-12 MED ORDER — HYDROCODONE-ACETAMINOPHEN 5-325 MG PO TABS: 1.0000 | ORAL_TABLET | Freq: Three times a day (TID) | ORAL | 0 refills | Status: DC | PRN

## 2022-05-12 MED ORDER — ALPRAZOLAM 1 MG PO TABS: 1.0000 mg | ORAL_TABLET | Freq: Three times a day (TID) | ORAL | 0 refills | Status: DC | PRN

## 2022-05-12 NOTE — Telephone Encounter (Signed)
Hydrocodone last filled 03-28-22 #90 Alprazolam last filled 03-28-22 #90

## 2022-05-12 NOTE — Telephone Encounter (Signed)
Prescription Request  05/12/2022   LOV: 04/21/2022  What is the name of the medication or equipment?  ALPRAZolam (XANAX) 1 MG tablet  HYDROcodone-acetaminophen (NORCO/VICODIN) 5-325 MG tablet   Have you contacted your pharmacy to request a refill? No   Which pharmacy would you like this sent to?  [id number] [address]Phone: [phone]: [phone]    Patient notified that their request is being sent to the clinical staff for review and that they should receive a response within 2 business days.   Please advise at [facility]

## 2022-05-21 ENCOUNTER — Encounter: Payer: Self-pay | Admitting: Oncology

## 2022-05-23 ENCOUNTER — Encounter: Payer: Self-pay | Admitting: Oncology

## 2022-05-26 ENCOUNTER — Ambulatory Visit (INDEPENDENT_AMBULATORY_CARE_PROVIDER_SITE_OTHER): Payer: Medicare HMO | Admitting: Internal Medicine

## 2022-05-26 ENCOUNTER — Telehealth: Payer: Self-pay | Admitting: Internal Medicine

## 2022-05-26 ENCOUNTER — Encounter: Payer: Self-pay | Admitting: Internal Medicine

## 2022-05-26 VITALS — BP 110/70 | HR 77 | Temp 97.7°F | Ht 62.5 in | Wt 100.0 lb

## 2022-05-26 DIAGNOSIS — F39 Unspecified mood [affective] disorder: Secondary | ICD-10-CM

## 2022-05-26 DIAGNOSIS — F1120 Opioid dependence, uncomplicated: Secondary | ICD-10-CM | POA: Diagnosis not present

## 2022-05-26 DIAGNOSIS — R69 Illness, unspecified: Secondary | ICD-10-CM | POA: Diagnosis not present

## 2022-05-26 DIAGNOSIS — M797 Fibromyalgia: Secondary | ICD-10-CM

## 2022-05-26 NOTE — Telephone Encounter (Signed)
Okay--I will see her then This doesn't sound like anything new

## 2022-05-26 NOTE — Progress Notes (Signed)
Subjective:    Patient ID: Lindsey Ward, female    DOB: Feb 26, 1951, 72 y.o.   MRN: [id number]  HPI Here with sister due to apparent medication reaction  Stopped the ritalin a while ago Then retried the savella--12.5 twice a day----and "my body would just not move, and I had trouble breathing" Sister noted she complained about her breathing also Took it this morning still  Ongoing anxiety--often related to pain Takes the xanax if her heart starts going during the day Otherwise just at bedtime  Current Outpatient Medications on File Prior to Visit  Medication Sig Dispense Refill   ALPRAZolam (XANAX) 1 MG tablet Take 1 tablet (1 mg total) by mouth 3 (three) times daily as needed. 90 tablet 0   HYDROcodone-acetaminophen (NORCO/VICODIN) 5-325 MG tablet Take 1 tablet by mouth 3 (three) times daily as needed. Dx Code M79.7 90 tablet 0   Menthol, Topical Analgesic, (BIOFREEZE EX) Apply 1 application  topically as needed.     omeprazole (PRILOSEC) 20 MG capsule TAKE 1 CAPSULE BY MOUTH EVERY DAY 90 capsule 3   Polyethyl Glycol-Propyl Glycol (SYSTANE OP) Apply to eye as needed.     promethazine (PHENERGAN) 12.5 MG tablet Take 1-2 tablets (12.5-25 mg total) by mouth 3 (three) times daily as needed for nausea or vomiting. 60 tablet 0   SUMAtriptan (IMITREX) 50 MG tablet TAKE 0.5-1 TABLET BY MOUTH DAILY AS NEEDED FOR MIGRAINE. 9 tablet 11   No current facility-administered medications on file prior to visit.    Allergies  Allergen Reactions   Tizanidine Other (See Comments)    "throws me in a different world"   Aspirin     REACTION: GI bleed   Buspirone Hcl     REACTION: unspecified   Ciprofloxacin     REACTION: unspecified   Citalopram Other (See Comments)    Ear ringing.    Codeine    Diazepam    Duloxetine     REACTION: sz like activity   Ibuprofen     REACTION: GI bleed   Imipramine Hcl Other (See Comments)    Ear ringing.    Oxycodone-Acetaminophen     REACTION:  unspecified   Pregabalin     REACTION: kept her up and confusion   Propoxyphene Hcl    Risperidone     REACTION: confusion   Sulfonamide Derivatives     REACTION: unspecified   Tramadol Hcl     REACTION: doesn't remember what happened but couldn't tolerate   Tramadol Hcl Other (See Comments)    REACTION: doesn't remember what happened but couldn't tolerate REACTION: doesn't remember what happened but couldn't tolerate REACTION: doesn't remember what happened but couldn't tolerate    Venlafaxine     REACTION: unspecified   Methocarbamol Rash   Savella [Milnacipran Hcl] Palpitations and Other (See Comments)    Dizziness    Past Medical History:  Diagnosis Date   Bowel obstruction ([place])    Chronic fatigue    Depression    Fibromyalgia    GERD (gastroesophageal reflux disease)    Hyperlipemia    Migraine    Obstipation    Osteopenia    PUD (peptic ulcer disease)     Past Surgical History:  Procedure Laterality Date   ABDOMINAL SURGERY     APPENDECTOMY  2005   BREAST BIOPSY Bilateral 1999   rt w/out clip - neg, lt w/clip - neg   BREAST CYST ASPIRATION Right 2003   neg   CHOLECYSTECTOMY  2004  COLONOSCOPY WITH PROPOFOL N/A 06/09/2016   Procedure: COLONOSCOPY WITH PROPOFOL;  Surgeon: Jonathon Bellows, MD;  Location[facility] ENDOSCOPY;  Service: Endoscopy;  Laterality: N/A;   ESOPHAGOGASTRODUODENOSCOPY (EGD) WITH PROPOFOL N/A 06/09/2016   Procedure: ESOPHAGOGASTRODUODENOSCOPY (EGD) WITH PROPOFOL;  Surgeon: Jonathon Bellows, MD;  Location: [facility] ENDOSCOPY;  Service: Endoscopy;  Laterality: N/A;   EYE SURGERY     GIVENS CAPSULE STUDY N/A 06/23/2016   Procedure: GIVENS CAPSULE STUDY;  Surgeon: Jonathon Bellows, MD;  Location: [facility] ENDOSCOPY;  Service: Endoscopy;  Laterality: N/A;   PARS PLANA VITRECTOMY W/ REPAIR OF MACULAR HOLE Left 01/2017   [facility]   TOTAL ABDOMINAL HYSTERECTOMY W/ BILATERAL SALPINGOOPHORECTOMY  1986    Family History  Problem Relation Age of Onset   Heart disease Mother         AMI   Stroke Mother    Heart disease Father    Hypertension Sister    Breast cancer Sister    Hypertension Sister    Heart disease Sister        MI   Diabetes Sister    Arthritis/Rheumatoid Sister    COPD Sister    Kidney cancer Neg Hx    Kidney disease Neg Hx    Prostate cancer Neg Hx     Social History   Socioeconomic History   Marital status: Single    Spouse name: Not on file   Number of children: 1   Years of education: Not on file   Highest education level: Not on file  Occupational History   Occupation: DISABLED  Tobacco Use   Smoking status: Former    Packs/day: 1.00    Years: 40.00    Additional pack years: 0.00    Total pack years: 40.00    Types: Cigarettes    Quit date: 09/25/2013    Years since quitting: 8.6    Passive exposure: Never   Smokeless tobacco: Never   Tobacco comments:       Vaping Use   Vaping Use: Never used  Substance and Sexual Activity   Alcohol use: Not Currently    Alcohol/week: 0.0 standard drinks of alcohol    Comment: rarely   Drug use: No   Sexual activity: Not on file  Other Topics Concern   Not on file  Social History Narrative   Living alone again      No living will   Daughter should make health care decisions   Requests DNR---done 10/24/14   No tube feeds if cognitively unaware   Highest level of education: 9th   Social Determinants of Health   Financial Resource Strain: Not on file  Food Insecurity: Not on file  Transportation Needs: Not on file  Physical Activity: Not on file  Stress: Not on file  Social Connections: Not on file  Intimate Partner Violence: Not on file   Review of Systems Eats some Weight down a bit though    Objective:   Physical Exam Constitutional:      Comments: Some wasting  Neurological:     Mental Status: She is alert.  Psychiatric:        Mood and Affect: Mood normal.        Behavior: Behavior normal.            Assessment & Plan:

## 2022-05-26 NOTE — Telephone Encounter (Signed)
Margo RN with access nurse called requesting appt in next 4 hrs due to pt starting today with generalized weakness; pt thinks due to changing meds., I spoke with pt and she scheduled appt with Dr Silvio Pate 05/26/22 at 4 PM with UC & ED precautions and pt voiced understanding. Sending note to Dr Silvio Pate and Silvio Pate pool.      [facility] - Client TELEPHONE ADVICE RECORD [facility] Patient Name: Lindsey Ward Gender: Female DOB: [birth year] Age: 72 Y 58 M 28 D Return Phone Number: [phone] (Primary), [phone] (Secondary) Address: City/ State/ Zip: [place]  [address] Client [facility] - Client Client Site [place] - Day Provider Viviana Simpler- MD Contact Type Call Who Is Calling Patient / Member / Family / Caregiver Call Type Triage / Clinical Caller Name Lindsey Ward Relationship To Patient Other Return Phone Number [phone] (Primary) Chief Complaint Walking difficulty Reason for Call Symptomatic / Request for [facility] states they have a pt. started a medication. They feel stuck and cannot move their body. Translation No Nurse Assessment Nurse: Manus Rudd, RN, [name] Date/Time (Eastern Time): 05/26/2022 11:59:28 AM Confirm and document reason for call. If symptomatic, describe symptoms. ---Caller states her medication has been changed due to heart racing. Began Caroline on the 11th instead of Ritalin and now is having general weakness all over. Does the patient have any new or worsening symptoms? ---Yes Will a triage be completed? ---Yes Related visit to physician within the last 2 weeks? ---No Does the PT have any chronic conditions? (i.e. diabetes, asthma, this includes High risk factors for pregnancy, etc.) ---Yes List chronic conditions. ---fibromyalgia, Is this a behavioral health or substance abuse call? ---No Guidelines Guideline Title Affirmed  Question Affirmed Notes Nurse Date/Time (Eastern Time) Weakness (Generalized) and Fatigue [1] MODERATE weakness (i.e., interferes with work, school, normal activities) AND [2] cause unknown (Exceptions: Weakness from acute minor illness or poor fluid intake; [place], RN, Joycelyn Schmid 05/26/2022 12:03:42 PM PLEASE NOTE: All timestamps contained within this report are represented as [place] Standard Time. CONFIDENTIALTY NOTICE: This fax transmission is intended only for the addressee. It contains information that is legally privileged, confidential or otherwise protected from use or disclosure. If you are not the intended recipient, you are strictly prohibited from reviewing, disclosing, copying using or disseminating any of this information or taking any action in reliance on or regarding this information. If you have received this fax in error, please notify us immediately by telephone so that we can arrange for its return to [place]. Phone: [phone], Toll-Free: [phone], Fax: [phone] Page: 2 of 2 Call Id: [id number] Guidelines Guideline Title Affirmed Question Affirmed Notes Nurse Date/Time [name] Time) weakness is chronic and not worse.) Disp. Time [name] Time) Disposition Final User 05/26/2022 12:08:43 PM See HCP within 4 Hours (or PCP triage) Yes Manus Rudd, RN, Joycelyn Schmid Final Disposition 05/26/2022 12:08:43 PM See HCP within 4 Hours (or PCP triage) Yes Manus Rudd, RN, Jamesetta Geralds Disagree/Comply Comply Caller Understands Yes PreDisposition InappropriateToAsk Care Advice Given Per Guideline SEE HCP (OR PCP TRIAGE) WITHIN 4 HOURS: * IF OFFICE WILL BE OPEN: You need to be seen within the next 3 or 4 hours. Call your doctor (or NP/PA) now or as soon as the office opens. CALL BACK IF: * You become worse CARE ADVICE given per Weakness and Fatigue (Adult) guideline. Referrals Warm transfer to backlin

## 2022-05-26 NOTE — Assessment & Plan Note (Signed)
Has not been able to find something to help Will be seeing Dr Dossie Arbour next week--hopefully he will have some ideas

## 2022-05-26 NOTE — Assessment & Plan Note (Signed)
PDMP reviewed  No concerns Told her I can continue to Rx the hydrocodone--want Dr Dossie Arbour to see if there are other modalities to help

## 2022-05-26 NOTE — Assessment & Plan Note (Signed)
Chronic anxiety Has failed multiple meds Only tolerates the xanax --so will continue

## 2022-05-26 NOTE — Telephone Encounter (Signed)
Pt called stating she started taking the meds, savella 12.'5mg'$ , on 05/15/22. Pt states she feels as if she cannot move, her body is stuck. Pt asked should she stop meds? Couldn't find meds on med list, pt stated [facility] prescribed meds. Transferred pt to access nurse. Call back # [phone]

## 2022-05-29 NOTE — Progress Notes (Signed)
Patient: Lindsey Ward  Service Category: E/M  Provider: Oswaldo Done, MD  DOB: 1950/08/12  DOS: 05/30/2022  Referring Provider: Karie Schwalbe, MD  MRN: [id number]  Setting: Ambulatory outpatient  PCP: Karie Schwalbe, MD  Type: New Patient  Specialty: Interventional Pain Management    Location: Office  Delivery: Face-to-face     Primary Reason(s) for Visit: Encounter for initial evaluation of one or more chronic problems (new to examiner) potentially causing chronic pain, and posing a threat to normal musculoskeletal function. (Level of risk: High) CC: Fibromyalgia (Entire body), Back Pain (lower), and Neck Pain (Radiates up to head-gives migraines)  HPI  Ms. Horwedel is a 72 y.o. year old, female patient, who comes for the first time to our practice referred by Karie Schwalbe, MD for our initial evaluation of her chronic pain. She has Hyperlipidemia; Mood disorder (HCC); PANIC ATTACKS; Atypical migraine; Gastroesophageal reflux disease; Fibromyalgia; OSTEOPENIA; Sleep disturbance; Routine general medical examination at a health care facility; Chronic neck pain (posterior) (Bilateral) (R>L); Advance directive discussed with patient; Chronic low back pain (1ry area of Pain) (Bilateral) (R>L) w/o sciatica; LPRD (laryngopharyngeal reflux disease); Constipation; Iron deficiency anemia; Narcotic dependence (HCC); Benzodiazepine dependence (HCC); Aortic atherosclerosis (HCC); Coronary artery calcification seen on CT scan; Macular hole, left eye; Chronic hip pain and (3ry area of Pain) (Bilateral) (R>L); Occipital headache (Bilateral); Chronic feet pain (5th area of Pain) (Bilateral) (R>L); Varicose veins of lower extremities without ulcer or inflammation, bilateral; Trochanteric bursitis of hip (Left); Chronic pain syndrome; Malnutrition of mild degree (HCC); Pharmacologic therapy; Disorder of skeletal system; Problems influencing health status; Vitamin D insufficiency; DDD (degenerative disc  disease), cervical; DDD (degenerative disc disease), lumbosacral; Grade 1 Retrolisthesis of L4/L5; Lumbar lateral recess stenosis (Right: L2-3) (Left: L4-5); Lumbosacral facet arthropathy; Osteoarthritis of facet joint of lumbar spine; Osteoarthritis of facet joint of cervical spine; Long term prescription benzodiazepine use; Chronic knee pain (4th area of Pain) (Bilateral) (L>R); Chronic shoulder pain (6th area of Pain) (Bilateral) (R>L); Cervicogenic headache (7th area of Pain) (Bilateral); Greater occipital neuralgia (Bilateral); and Cervico-occipital neuralgia (Bilateral) on their problem list. Today she comes in for evaluation of her Fibromyalgia (Entire body), Back Pain (lower), and Neck Pain (Radiates up to head-gives migraines)  Pain Assessment: Location: Lower, Right Back (knees- bilateral) Radiating: buttocks bilateral around to front thighs to above knees Onset: More than a month ago Duration: Chronic pain Quality: Aching, Constant, Burning, Crushing, Crying, Discomfort, Dull, Grimacing, Pounding, Stabbing, Sore, Radiating, Restless, Heaviness Severity: 7 /10 (subjective, self-reported pain score)  Effect on ADL: ADL', "I cant do anythng", prolonged walking and standing Timing: Constant Modifying factors: heat, rest BP: (!) 134/49  HR: 65  Onset and Duration: Present longer than 3 months Cause of pain: Unknown Severity: Getting worse, NAS-11 at its worse: 10/10, NAS-11 at its best: 0/10, NAS-11 now: 10/10, and NAS-11 on the average: 10/10 Timing: Morning, Afternoon, and Night Aggravating Factors: Bending, Kneeling, Lifiting, Prolonged sitting, Prolonged standing, Squatting, Stooping , Twisting, Walking, Walking uphill, Walking downhill, and Working Alleviating Factors: Hot packs, Lying down, and Medications Associated Problems: Depression, Fatigue, Inability to concentrate, Personality changes, Sadness, Spasms, Swelling, Weakness, and Pain that wakes patient up Quality of Pain:  Aching, Agonizing, Annoying, Burning, Constant, Intermittent, Cruel, Deep, Disabling, Distressing, Dreadful, Exhausting, Fearful, Feeling of constriction, Getting longer, Heavy, Horrible, Pressure-like, Pulsating, Punishing, Sharp, Shooting, Sickening, Stabbing, Tender, Throbbing, Tiring, and Uncomfortable Previous Examinations or Tests: Endoscopy, MRI scan, Nerve block, Spinal tap, X-rays, Nerve conduction test, Neurological evaluation,  Orthopedic evaluation, Chiropractic evaluation, and Psychiatric evaluation Previous Treatments: Chiropractic manipulations and Physical Therapy  Ms. Mehdi is being evaluated for possible interventional pain management therapies for the treatment of her chronic pain.   The patient comes into the clinic today accompanied by her sister.  According to the patient the primary area of pain is that of the lower back (Bilateral) (R>L).  She denies any prior surgeries.  She does indicate having had some physical therapy around 2001 for her fibromyalgia which included physical therapy for the lower back.  She also indicates having had some nerve blocks done by Dr. Yves Dill at the [facility].  She indicates that the nerve blocks by Dr. Yves Dill did not help.  She denies any recent x-rays of the lumbar spine.  The patient's secondary area of pain is that of the neck (posterior) (Bilateral) (R>L).  She denies any prior cervical surgery.  She describes having had some injections done by Dr. Yves Dill [facility]).  She indicates that the injections done by Dr. Yves Dill did not help.  She denies having had any recent x-rays of the cervical spine.  She also denies any physical therapy.  According to the patient the third area pain is that of the hips (Bilateral) (R>L).  She denies any prior hip surgery, physical therapy, joint injections, or recent x-rays.  The patient's fourth area pain is that of the knees (Bilateral) (L>R).  She denies any prior knee surgeries, joint  injections, or recent x-rays.  She does indicate having had the physical therapy in 2001 where they worked on the knees as well but the physical therapy did not help.  The patient's fifth area pain is that of her feet (Bilateral) (R>L).  She describes having had 2 prior surgeries in each feet where they did bunionectomies.  They had to go in on the second time to remove the rest of the tissue but the surgery was successful in helping with the problem that she had at the time.  She denies any physical therapy, recent x-rays, or nerve blocks or joint injections in her feet.  The patient's sixth area pain is that of the shoulders (Bilateral) (R>L).  She denies any prior shoulder surgeries, recent x-rays, joint injections or nerve blocks, but she indicates that the physical therapy that she did in 2001 included working on the shoulders and again it did not help.  The patient's seventh area pain is that of her headaches which are located at the very top of her head and they usually start in the occipital region.  These headaches are described to be bilateral.  She denies any prior surgeries, x-rays, or nerve blocks.  Headaches seem to follow the distribution of the greater occipital nerve, bilaterally.  The patient's eighth complaint of pain is that of fibromyalgia with generalized pain.  The patient indicates that she was given disability secondary to this fibromyalgia which was the reason why she stopped working.  She states that she will experience generalized pain where she ends up in bed for close to 2 weeks and has very poor quality of life.  She denies any regular exercising and she also states constantly having very low energy levels.  The patient lives alone and she is having difficulty maintaining her activities of daily living.  In general, the patient indicates having generalized myalgias and arthralgias.  Pharmacotherapy: The patient uses benzodiazepines (alprazolam) as well as opioid analgesics  (hydrocodone/APAP) on a daily basis.  Ms. Bohart has been informed  that this initial visit was an evaluation only.  On the follow up appointment I will go over the results, including ordered tests and available interventional therapies. At that time she will have the opportunity to decide whether to proceed with offered therapies or not. In the event that Ms. Duddy prefers avoiding interventional options, this will conclude our involvement in the case.  Medication management recommendations may be provided upon request.  Historic Controlled Substance Pharmacotherapy Review  PMP and historical list of controlled substances: Hydrocodone/APAP 5/325, 1 tab p.o. 3 times daily (# 90) (last filled on 05/12/2022); alprazolam 1 mg tablet, 1 tab p.o. 3 times daily (# 90) (last filled on 05/12/2022); Methylphenidate (Ritalin: Stimulant) 20 Mg Table, 1 tab p.o. twice daily (# 60) (last filled on 02/14/2022) Most recently prescribed opioid analgesics:   Hydrocodone/APAP 5/325, 1 tab p.o. 3 times daily (# 90) (last filled on 05/12/2022) MME/day: 15 mg/day  Historical Monitoring: The patient  reports no history of drug use. List of prior UDS Testing: No results found for: "MDMA", "COCAINSCRNUR", "PCPSCRNUR", "PCPQUANT", "CANNABQUANT", "THCU", "ETH", "CBDTHCR", "D8THCCBX", "D9THCCBX" Historical Background Evaluation: [place] PMP: PDMP reviewed during this encounter. Review of the past 31-months conducted.             PMP NARX Score Report:  Narcotic: 401 Sedative: 461 Stimulant: 140 [place] Department of public safety, offender search: Engineer, mining Information) Non-contributory Risk Assessment Profile: Aberrant behavior: None observed or detected today Risk factors for fatal opioid overdose: None identified today PMP NARX Overdose Risk Score: 100 Fatal overdose hazard ratio (HR): Calculation deferred Non-fatal overdose hazard ratio (HR): Calculation deferred Risk of opioid abuse or dependence: 0.7-3.0% with doses ? 36 MME/day  and 6.1-26% with doses ? 120 MME/day. Substance use disorder (SUD) risk level: See below Personal History of Substance Abuse (SUD-Substance use disorder):  Alcohol: Negative  Illegal Drugs: Negative  Rx Drugs: Negative  ORT Risk Level calculation: Low Risk  Opioid Risk Tool - 05/30/22 1255       Family History of Substance Abuse   Alcohol Negative    Illegal Drugs Negative    Rx Drugs Negative      Personal History of Substance Abuse   Alcohol Negative    Illegal Drugs Negative    Rx Drugs Negative      Psychological Disease   Psychological Disease Positive    ADD Negative    OCD Negative    Bipolar Negative    Schizophrenia Negative    Depression Positive   Xanax if needed.     Total Score   Opioid Risk Tool Scoring 3    Opioid Risk Interpretation Low Risk            ORT Scoring interpretation table:  Score <3 = Low Risk for SUD  Score between 4-7 = Moderate Risk for SUD  Score >8 = High Risk for Opioid Abuse   PHQ-2 Depression Scale:  Total score:    PHQ-2 Scoring interpretation table: (Score and probability of major depressive disorder)  Score 0 = No depression  Score 1 = 15.4% Probability  Score 2 = 21.1% Probability  Score 3 = 38.4% Probability  Score 4 = 45.5% Probability  Score 5 = 56.4% Probability  Score 6 = 78.6% Probability   PHQ-9 Depression Scale:  Total score:    PHQ-9 Scoring interpretation table:  Score 0-4 = No depression  Score 5-9 = Mild depression  Score 10-14 = Moderate depression  Score 15-19 = Moderately severe depression  Score  20-27 = Severe depression (2.4 times higher risk of SUD and 2.89 times higher risk of overuse)   Pharmacologic Plan: As per protocol, I have not taken over any controlled substance management, pending the results of ordered tests and/or consults.            Initial impression: Pending review of available data and ordered tests.  Meds   Current Outpatient Medications:    ALPRAZolam (XANAX) 1 MG tablet,  Take 1 tablet (1 mg total) by mouth 3 (three) times daily as needed., Disp: 90 tablet, Rfl: 0   diphenhydrAMINE (BENADRYL) 25 mg capsule, Take 25 mg by mouth every 6 (six) hours as needed., Disp: , Rfl:    HYDROcodone-acetaminophen (NORCO/VICODIN) 5-325 MG tablet, Take 1 tablet by mouth 3 (three) times daily as needed. Dx Code M79.7, Disp: 90 tablet, Rfl: 0   Menthol, Topical Analgesic, (BIOFREEZE EX), Apply 1 application  topically as needed., Disp: , Rfl:    omeprazole (PRILOSEC) 20 MG capsule, TAKE 1 CAPSULE BY MOUTH EVERY DAY, Disp: 90 capsule, Rfl: 3   Polyethyl Glycol-Propyl Glycol (SYSTANE OP), Apply to eye as needed., Disp: , Rfl:    promethazine (PHENERGAN) 12.5 MG tablet, Take 1-2 tablets (12.5-25 mg total) by mouth 3 (three) times daily as needed for nausea or vomiting., Disp: 60 tablet, Rfl: 0   SUMAtriptan (IMITREX) 50 MG tablet, TAKE 0.5-1 TABLET BY MOUTH DAILY AS NEEDED FOR MIGRAINE., Disp: 9 tablet, Rfl: 11   TOBRADEX ophthalmic ointment, SMARTSIG:Sparingly Right Eye Twice Daily, Disp: , Rfl:   Imaging Review  Cervical Imaging: Cervical MR wo contrast: Results for orders placed during the hospital encounter of 10/05/06 MR Cervical Spine Wo Contrast  Narrative Clinical Data: Neck pain and limited range of motion. MRI CERVICAL SPINE WITHOUT CONTRAST: Technique:  Multiplanar and multiecho pulse sequences of the cervical spine, to include the craniocervical junction and cervicothoracic junction, were obtained according to standard protocol without IV contrast. Findings: There is no abnormality at the foramen magnum, C1-2, or C2-3. At C3-4, there is minimal uncovertebral degeneration but no significant finding. At C4-5, there is moderate uncovertebral degeneration bilaterally. No effect upon the cord. Mild foraminal encroachment bilaterally. At C5-6, there is moderate uncovertebral degeneration bilaterally. No effect upon the cord. Mild foraminal encroachment bilaterally. At C6-7,  there is mild uncovertebral degeneration on the left and moderate on the right. No effect upon the cord. Mild foraminal encroachment. At C7-T1, there is minimal disk bulge but no herniation or stenosis. There is minimal facet degeneration. The upper thoracic region is negative. Cervical spinal cord shows normal signal characteristics throughout.  Impression Mild uncovertebral degeneration throughout the mid portion of the cervical spine as described above. There is no soft disk herniation. There is no spinal stenosis and no effect upon the cord. Uncovertebral osteophytes encroach upon the foramina mildly in the mid cervical region but there is no pronounced foraminal stenosis.  Provider: Mary B. Foster  Cervical DG 2-3 views: Results for orders placed during the hospital encounter of 05/06/09 DG Cervical Spine 2-3 Views  Narrative Clinical Data: Neck and left arm pain  CERVICAL SPINE - 2-3 VIEW  Comparison: Seven 24,008  Findings: Anatomic alignment.  No vertebral body height loss.  Mild narrowing at C3-4.  Moderate narrowing at C4-5 and C5-6.  Posterior osteophytic ridging is present at these three levels.  Oblique views are not included.  Unremarkable prevertebral soft tissues.  IMPRESSION: Degenerative change at C3-4, C4-5, and C5-6.  No acute bony pathology.  Limited  exam.  Provider: Youlanda Roys  Shoulder Imaging: Shoulder-L DG: Results for orders placed during the hospital encounter of 05/06/09 DG Shoulder Left  Narrative Clinical Data: Left shoulder pain  LEFT SHOULDER - 2+ VIEW  Comparison: None.  Findings: No acute fracture.  No dislocation.  Unremarkable soft tissues.  AC joint is normal.  IMPRESSION: No acute bony pathology.  Provider: Youlanda Roys  Lumbosacral Imaging: Lumbar MR wo contrast: Results for orders placed during the hospital encounter of 08/11/20 MR LUMBAR SPINE WO CONTRAST  Narrative CLINICAL DATA:  Low back pain.  Buttock and leg  pain  EXAM: MRI LUMBAR SPINE WITHOUT CONTRAST  TECHNIQUE: Multiplanar, multisequence MR imaging of the lumbar spine was performed. No intravenous contrast was administered.  COMPARISON:  CT lumbar spine 06/12/2014  FINDINGS: Segmentation:  Normal  Alignment:  Mild retrolisthesis L2-3 and L3-4.  Mild levoscoliosis.  Vertebrae:  Negative for fracture or mass.  Conus medullaris and cauda equina: Conus extends to the L1 level. Conus and cauda equina appear normal.  Paraspinal and other soft tissues: Dilated common bile duct 11 mm. This is unchanged from the prior CT 2016. Postop cholecystectomy.  No paraspinous mass or adenopathy.  Disc levels:  T12-L1: Negative  L1-2: Negative  L2-3: Asymmetric disc degeneration on the right with disc space narrowing and spurring on the right. Mild right subarticular stenosis. Spinal canal normal in size  L3-4: Mild disc degeneration asymmetric to the right. No significant spinal or foraminal stenosis.  L4-5: Mild retrolisthesis. Diffuse disc and facet degeneration. Moderate left subarticular stenosis due to spurring.  L5-S1: Mild disc and facet degeneration.  Negative for stenosis.  IMPRESSION: Mild right subarticular stenosis at L2-3 due to asymmetric disc degeneration and spurring  Moderate subarticular stenosis on the left at L4-5 due to spurring  Dilated common bile duct 11 mm, unchanged from 2016.   Electronically Signed By: Marlan Palau M.D. On: 08/11/2020 17:10  Lumbar CT wo contrast: Results for orders placed in visit on 07/09/14 CT Lumbar Spine Wo Contrast  Lumbar DG (Complete) 4+V: Results for orders placed in visit on 06/25/02 DG Lumbar Spine Complete  Narrative FINDINGS CLINICAL DATA:    FALL.  LOW BACK AND RIGHT HIP PAIN. LUMBAR SPINE, 06/25/02 COMPARISON:  NONE. FOUR VIEWS OF THE LUMBAR SPINE SHOW NO ACUTE FRACTURE OR SUBLUXATION.  INTERVERTEBRAL DISC HEIGHT IS PRESERVED.  MILD STRAIGHTENING OF THE  LUMBAR LORDOSIS IS NOTED.  SURGICAL CLIPS IN THE RIGHT UPPER QUADRANT SUGGEST PRIOR CHOLECYSTECTOMY. IMPRESSION NO ACUTE BONY ABNORMALITY. RIGHT HIP FRONTAL PELVIS AND AP/FROGLEG VIEWS OF THE RIGHT HIP SHOW NO ACUTE FRACTURE.  THE FEMORAL HEAD APPEARS LOCATED.  NO OTHER ACUTE BONY ABNORMALITY IDENTIFIED. IMPRESSION NORMAL STUDY.  Knee Imaging: Knee-R DG 4 views: Results for orders placed during the hospital encounter of 11/13/15 DG Knee Complete 4 Views Right  Narrative CLINICAL DATA:  RIGHT knee injury, pain and swelling.  EXAM: RIGHT KNEE - COMPLETE 4+ VIEW  COMPARISON:  None.  FINDINGS: No evidence of fracture, dislocation, or joint effusion. No evidence of advanced arthropathy or other focal bone abnormality. Small suprapatellar joint effusion.  IMPRESSION: Small suprapatellar joint effusion. No acute fracture deformity or dislocation.   Electronically Signed By: Awilda Metro M.D. On: 11/13/2015 15:52  Foot Imaging: Foot-R DG Complete: Results for orders placed during the hospital encounter of 08/05/21 DG Foot Complete Right  Narrative CLINICAL DATA:  twisted a few days ago. Pain along proximal 5th metatarsal  EXAM: RIGHT FOOT COMPLETE - 3+ VIEW  COMPARISON:  None Available.  FINDINGS: Previous bunionectomy with metallic pin in place. Single screw in the distal fifth metatarsal. No evidence of acute fracture or joint malalignment midfoot degenerative change. Plantar calcaneal enthesophyte. Soft tissue edema.  IMPRESSION: 1. No evidence of acute fracture or joint malalignment. 2. Postsurgical changes, detailed above.   Electronically Signed By: Feliberto Harts M.D. On: 08/05/2021 15:16  Complexity Note: Imaging results reviewed.                         ROS  Cardiovascular: Abnormal heart rhythm and Chest pain Pulmonary or Respiratory: Smoking and Snoring  Neurological: Abnormal skin sensations (Peripheral  Neuropathy) Psychological-Psychiatric: Anxiousness, Depressed, and Prone to panicking Gastrointestinal: Reflux or heatburn Genitourinary: No reported renal or genitourinary signs or symptoms such as difficulty voiding or producing urine, peeing blood, non-functioning kidney, kidney stones, difficulty emptying the bladder, difficulty controlling the flow of urine, or chronic kidney disease Hematological: Brusing easily Endocrine: No reported endocrine signs or symptoms such as high or low blood sugar, rapid heart rate due to high thyroid levels, obesity or weight gain due to slow thyroid or thyroid disease Rheumatologic: Generalized muscle aches (Fibromyalgia) and Constant unexplained fatigue (Chronic Fatigue Syndrome) Musculoskeletal: Negative for myasthenia gravis, muscular dystrophy, multiple sclerosis or malignant hyperthermia Work History: Disabled  Allergies  Ms. Mcmunn is allergic to tizanidine, aspirin, buspirone hcl, ciprofloxacin, citalopram, codeine, diazepam, duloxetine, ibuprofen, imipramine hcl, oxycodone-acetaminophen, pregabalin, propoxyphene hcl, risperidone, savella [milnacipran], sulfonamide derivatives, tramadol hcl, tramadol hcl, venlafaxine, methocarbamol, and savella [milnacipran hcl].  Laboratory Chemistry Profile   Renal Lab Results  Component Value Date   BUN 16 04/21/2022   CREATININE 0.69 04/21/2022   GFR 85.96 04/21/2022   GFRAA >60 10/05/2017   GFRNONAA >60 10/08/2021   SPECGRAV <=1.005 (A) 02/22/2022   PHUR 6.0 02/22/2022   PROTEINUR Negative 02/22/2022     Electrolytes Lab Results  Component Value Date   NA 139 04/21/2022   K 3.9 04/21/2022   CL 102 04/21/2022   CALCIUM 9.5 04/21/2022   PHOS 3.7 03/25/2010     Hepatic Lab Results  Component Value Date   AST 25 04/21/2022   ALT 12 04/21/2022   ALBUMIN 4.1 04/21/2022   ALKPHOS 87 04/21/2022   LIPASE 31 12/18/2020     ID Lab Results  Component Value Date   HCVAB NEGATIVE 06/03/2015      Bone Lab Results  Component Value Date   VD25OH 24.90 (L) 04/21/2022     Endocrine Lab Results  Component Value Date   GLUCOSE 94 04/21/2022   GLUCOSEU NEGATIVE 12/18/2020   TSH 1.44 04/21/2022   FREET4 1.03 06/22/2021     Neuropathy Lab Results  Component Value Date   VITAMINB12 290 04/21/2022     CNS No results found for: "COLORCSF", "APPEARCSF", "RBCCOUNTCSF", "WBCCSF", "POLYSCSF", "LYMPHSCSF", "EOSCSF", "PROTEINCSF", "GLUCCSF", "JCVIRUS", "CSFOLI", "IGGCSF", "LABACHR", "ACETBL"   Inflammation (CRP: Acute  ESR: Chronic) Lab Results  Component Value Date   ESRSEDRATE 8 04/21/2022     Rheumatology No results found for: "RF", "ANA", "LABURIC", "URICUR", "LYMEIGGIGMAB", "LYMEABIGMQN", "HLAB27"   Coagulation Lab Results  Component Value Date   PLT 298.0 04/21/2022     Cardiovascular Lab Results  Component Value Date   CKTOTAL 88 12/24/2015   CKMB 1.6 04/30/2008   TROPONINI <0.03 10/05/2017   HGB 12.4 04/21/2022   HCT 35.8 (L) 04/21/2022     Screening Lab Results  Component Value Date   HCVAB NEGATIVE 06/03/2015  Cancer No results found for: "CEA", "CA125", "LABCA2"   Allergens No results found for: "ALMOND", "APPLE", "ASPARAGUS", "AVOCADO", "BANANA", "BARLEY", "BASIL", "BAYLEAF", "GREENBEAN", "LIMABEAN", "WHITEBEAN", "BEEFIGE", "REDBEET", "BLUEBERRY", "BROCCOLI", "CABBAGE", "MELON", "CARROT", "CASEIN", "CASHEWNUT", "CAULIFLOWER", "CELERY"     Note: Lab results reviewed.  PFSH  Drug: Ms. Prestage  reports no history of drug use. Alcohol:  reports that she does not currently use alcohol. Tobacco:  reports that she quit smoking about 8 years ago. Her smoking use included cigarettes. She has a 40.00 pack-year smoking history. She has never been exposed to tobacco smoke. She has never used smokeless tobacco. Medical:  has a past medical history of Bowel obstruction (HCC), Chronic fatigue, Depression, Fibromyalgia, GERD (gastroesophageal reflux disease),  Hyperlipemia, Migraine, Obstipation, Osteopenia, and PUD (peptic ulcer disease). Family: family history includes Arthritis/Rheumatoid in her sister; Breast cancer in her sister; COPD in her sister; Diabetes in her sister; Heart disease in her father, mother, and sister; Hypertension in her sister and sister; Stroke in her mother.  Past Surgical History:  Procedure Laterality Date   ABDOMINAL SURGERY     APPENDECTOMY  2005   BREAST BIOPSY Bilateral 1999   rt w/out clip - neg, lt w/clip - neg   BREAST CYST ASPIRATION Right 2003   neg   CHOLECYSTECTOMY  2004   COLONOSCOPY WITH PROPOFOL N/A 06/09/2016   Procedure: COLONOSCOPY WITH PROPOFOL;  Surgeon: Wyline Mood, MD;  Location: [facility];  Service: Endoscopy;  Laterality: N/A;   ESOPHAGOGASTRODUODENOSCOPY (EGD) WITH PROPOFOL N/A 06/09/2016   Procedure: ESOPHAGOGASTRODUODENOSCOPY (EGD) WITH PROPOFOL;  Surgeon: Wyline Mood, MD;  Location: [facility];  Service: Endoscopy;  Laterality: N/A;   EYE SURGERY     GIVENS CAPSULE STUDY N/A 06/23/2016   Procedure: GIVENS CAPSULE STUDY;  Surgeon: Wyline Mood, MD;  Location: [facility] ENDOSCOPY;  Service: Endoscopy;  Laterality: N/A;   PARS PLANA VITRECTOMY W/ REPAIR OF MACULAR HOLE Left 01/2017   [facility]   TOTAL ABDOMINAL HYSTERECTOMY W/ BILATERAL SALPINGOOPHORECTOMY  1986   Active Ambulatory Problems    Diagnosis Date Noted   Hyperlipidemia 12/20/2005   Mood disorder (HCC) 08/15/2006   PANIC ATTACKS 12/20/2005   Atypical migraine 12/20/2005   Gastroesophageal reflux disease 08/15/2006   Fibromyalgia 08/15/2006   OSTEOPENIA 03/10/2009   Sleep disturbance 08/15/2006   Routine general medical examination at a health care facility 07/14/2010   Chronic neck pain (posterior) (Bilateral) (R>L) 03/04/2014   Advance directive discussed with patient 10/24/2014   Chronic low back pain (1ry area of Pain) (Bilateral) (R>L) w/o sciatica 06/17/2015   LPRD (laryngopharyngeal reflux disease) 09/21/2015    Constipation 05/17/2016   Iron deficiency anemia 06/05/2016   Narcotic dependence (HCC) 03/22/2018   Benzodiazepine dependence (HCC) 03/22/2018   Aortic atherosclerosis (HCC) 04/23/2018   Coronary artery calcification seen on CT scan 04/23/2018   Macular hole, left eye 01/19/2017   Chronic hip pain and (3ry area of Pain) (Bilateral) (R>L) 01/02/2020   Occipital headache (Bilateral) 07/15/2020   Chronic feet pain (5th area of Pain) (Bilateral) (R>L) 08/05/2021   Varicose veins of lower extremities without ulcer or inflammation, bilateral 10/18/2021   Trochanteric bursitis of hip (Left) 03/10/2022   Chronic pain syndrome 04/21/2022   Malnutrition of mild degree (HCC) 04/21/2022   Pharmacologic therapy 05/30/2022   Disorder of skeletal system 05/30/2022   Problems influencing health status 05/30/2022   Vitamin D insufficiency 05/30/2022   DDD (degenerative disc disease), cervical 05/30/2022   DDD (degenerative disc disease), lumbosacral 05/30/2022   Grade 1 Retrolisthesis  of L4/L5 05/30/2022   Lumbar lateral recess stenosis (Right: L2-3) (Left: L4-5) 05/30/2022   Lumbosacral facet arthropathy 05/30/2022   Osteoarthritis of facet joint of lumbar spine 05/30/2022   Osteoarthritis of facet joint of cervical spine 05/30/2022   Long term prescription benzodiazepine use 05/30/2022   Chronic knee pain (4th area of Pain) (Bilateral) (L>R) 05/30/2022   Chronic shoulder pain (6th area of Pain) (Bilateral) (R>L) 05/30/2022   Cervicogenic headache (7th area of Pain) (Bilateral) 05/30/2022   Greater occipital neuralgia (Bilateral) 05/30/2022   Cervico-occipital neuralgia (Bilateral) 05/30/2022   Resolved Ambulatory Problems    Diagnosis Date Noted   RHINITIS, ALLERGIC 12/20/2005   MENOPAUSAL SYNDROME 12/20/2005   FATIGUE, CHRONIC 08/15/2006   DIZZINESS 03/25/2010   RML pneumonia 09/13/2010   Smoker 09/13/2010   RLQ abdominal pain 11/02/2010   Bad odor of urine 03/09/2012   Abnormal urine  odor 03/22/2013   Abdominal pain, bilateral lower quadrant 04/05/2013   Pleuritic chest pain 12/16/2013   Edema 05/29/2014   Grief reaction 05/29/2014   Dizziness and giddiness 07/01/2014   Bradycardia 07/03/2014   Rash and nonspecific skin eruption 08/27/2014   Lower abdominal pain 10/24/2014   Cystitis 01/14/2015   Urinary urgency 02/09/2015   Dizziness 06/03/2015   Memory loss 09/08/2015   Myalgia 12/24/2015   Neuropathy 05/06/2016   Fatigue 05/17/2016   Meralgia paresthetica, left 07/07/2016   Aphthous ulcer 09/08/2016   Nausea 11/21/2016   Left lower quadrant pain 11/21/2016   Lightheaded 12/27/2017   Chest pain 02/13/2018   Cervical strain 04/10/2018   Viral syndrome 06/12/2018   Pain of left thumb 03/25/2019   Ringworm 03/25/2019   Quadriceps weakness 01/17/2020   Eye swelling 11/09/2020   Polymyalgia rheumatica syndrome (HCC) 11/09/2020   Eye pain 03/30/2021   Skin lesion 07/21/2021   Varicosities of leg 10/18/2021   Palpitations 10/18/2021   Urinary frequency 02/22/2022   Past Medical History:  Diagnosis Date   Bowel obstruction (HCC)    Chronic fatigue    Depression    GERD (gastroesophageal reflux disease)    Hyperlipemia    Migraine    Obstipation    Osteopenia    PUD (peptic ulcer disease)    Constitutional Exam  General appearance: Well nourished, well developed, and well hydrated. In no apparent acute distress Vitals:   05/30/22 1243  BP: (!) 134/49  Pulse: 65  Resp: 16  Temp: (!) 96.3 F (35.7 C)  SpO2: 99%  Weight: 99 lb (44.9 kg)  Height: 5\' 3"  (1.6 m)   BMI Assessment: Estimated body mass index is 17.54 kg/m as calculated from the following:   Height as of this encounter: 5\' 3"  (1.6 m).   Weight as of this encounter: 99 lb (44.9 kg).  BMI interpretation table: BMI level Category Range association with higher incidence of chronic pain  <18 kg/m2 Underweight   18.5-24.9 kg/m2 Ideal body weight   25-29.9 kg/m2 Overweight Increased  incidence by 20%  30-34.9 kg/m2 Obese (Class I) Increased incidence by 68%  35-39.9 kg/m2 Severe obesity (Class II) Increased incidence by 136%  >40 kg/m2 Extreme obesity (Class III) Increased incidence by 254%   Patient's current BMI Ideal Body weight  Body mass index is 17.54 kg/m. Female patients must weigh at least 45.5 kg to calculate ideal body weight   BMI Readings from Last 4 Encounters:  05/30/22 17.54 kg/m  05/26/22 18.00 kg/m  04/21/22 18.54 kg/m  03/10/22 18.07 kg/m   Wt Readings from Last 4 Encounters:  05/30/22 99 lb (44.9 kg)  05/26/22 100 lb (45.4 kg)  04/21/22 103 lb (46.7 kg)  03/10/22 102 lb (46.3 kg)    Psych/Mental status: Alert, oriented x 3 (person, place, & time)       Eyes: PERLA Respiratory: No evidence of acute respiratory distress  Assessment  Primary Diagnosis & Pertinent Problem List: The primary encounter diagnosis was Chronic pain syndrome. Diagnoses of Chronic low back pain (1ry area of Pain) (Bilateral) (R>L) w/o sciatica, DDD (degenerative disc disease), lumbosacral, Grade 1 Retrolisthesis of L4/L5, Lumbar lateral recess stenosis (Right: L2-3) (Left: L4-5), Lumbosacral facet arthropathy, Osteoarthritis of facet joint of lumbar spine, Chronic neck pain (2ry area of Pain) (posterior) (Bilateral) (R>L), Osteoarthritis of facet joint of cervical spine, DDD (degenerative disc disease), cervical, Chronic hip pain and (3ry area of Pain) (Bilateral) (R>L), Chronic knee pain (4th area of Pain) (Bilateral) (L>R), Chronic feet pain (5th area of Pain) (Bilateral) (R>L), Chronic shoulder pain (6th area of Pain) (Bilateral) (R>L), Cervicogenic headache (7th area of Pain) (Bilateral), Occipital headache (Bilateral), Greater occipital neuralgia (Bilateral), Cervico-occipital neuralgia (Bilateral), Fibromyalgia, Pharmacologic therapy, Chronic use of opiate for therapeutic purpose, Narcotic dependence (HCC), Long term prescription benzodiazepine use, Benzodiazepine  dependence (HCC), Disorder of skeletal system, Problems influencing health status, and Vitamin D insufficiency were also pertinent to this visit.  Visit Diagnosis (New problems to examiner): 1. Chronic pain syndrome   2. Chronic low back pain (1ry area of Pain) (Bilateral) (R>L) w/o sciatica   3. DDD (degenerative disc disease), lumbosacral   4. Grade 1 Retrolisthesis of L4/L5   5. Lumbar lateral recess stenosis (Right: L2-3) (Left: L4-5)   6. Lumbosacral facet arthropathy   7. Osteoarthritis of facet joint of lumbar spine   8. Chronic neck pain (2ry area of Pain) (posterior) (Bilateral) (R>L)   9. Osteoarthritis of facet joint of cervical spine   10. DDD (degenerative disc disease), cervical   11. Chronic hip pain and (3ry area of Pain) (Bilateral) (R>L)   12. Chronic knee pain (4th area of Pain) (Bilateral) (L>R)   13. Chronic feet pain (5th area of Pain) (Bilateral) (R>L)   14. Chronic shoulder pain (6th area of Pain) (Bilateral) (R>L)   15. Cervicogenic headache (7th area of Pain) (Bilateral)   16. Occipital headache (Bilateral)   17. Greater occipital neuralgia (Bilateral)   18. Cervico-occipital neuralgia (Bilateral)   19. Fibromyalgia   20. Pharmacologic therapy   21. Chronic use of opiate for therapeutic purpose   22. Narcotic dependence (HCC)   23. Long term prescription benzodiazepine use   24. Benzodiazepine dependence (HCC)   25. Disorder of skeletal system   26. Problems influencing health status   27. Vitamin D insufficiency    Plan of Care (Initial workup plan)  Note: Ms. Sofield was reminded that as per protocol, today's visit has been an evaluation only. We have not taken over the patient's controlled substance management.  Problem-specific plan: No problem-specific Assessment & Plan notes found for this encounter.  Lab Orders         Compliance Drug Analysis, Ur         Magnesium         Vitamin B12         Sedimentation rate         25-Hydroxy vitamin D Lcms  D2+D3         C-reactive protein     Imaging Orders         DG Cervical Spine With Flex &  Extend         DG Lumbar Spine Complete W/Bend         DG Shoulder Right         DG Shoulder Left         DG HIP UNILAT W OR W/O PELVIS 2-3 VIEWS RIGHT         DG HIP UNILAT W OR W/O PELVIS 2-3 VIEWS LEFT         DG Knee Complete 4 Views Right         DG Knee Complete 4 Views Left         DG Foot Complete Right         DG Foot Complete Left     Referral Orders  No referral(s) requested today   Procedure Orders    No procedure(s) ordered today   Pharmacotherapy (current): Medications ordered:  No orders of the defined types were placed in this encounter.  Medications administered during this visit: Casara Churchill. Basgall had no medications administered during this visit.   Analgesic Pharmacotherapy:  Opioid Analgesics: For patients currently taking or requesting to take opioid analgesics, in accordance with [facility] Guidelines, we will assess their risks and indications for the use of these substances. After completing our evaluation, we may offer recommendations, but we no longer take patients for medication management. The prescribing physician will ultimately decide, based on his/her training and level of comfort whether to adopt any of the recommendations, including whether or not to prescribe such medicines.  Membrane stabilizer: To be determined at a later time  Muscle relaxant: To be determined at a later time  NSAID: To be determined at a later time  Other analgesic(s): To be determined at a later time   Interventional management options: Ms. Alpha was informed that there is no guarantee that she would be a candidate for interventional therapies. The decision will be based on the results of diagnostic studies, as well as Ms. Zucker's risk profile.  Procedure(s) under consideration:  Pending results of ordered studies      Interventional Therapies  Risk Factors   Considerations:     Planned  Pending:   See above for possible orders   Under consideration:   Pending completion of evaluation   Completed:   None at this time   Completed by other providers:   None reported   Therapeutic  Palliative (PRN) options:   None established      Provider-requested follow-up: Return in about 2 weeks (around 06/13/2022) for ( ), Eval-day (M,W), (F2F), 2nd Visit, for review of ordered tests.  Future Appointments  Date Time Provider Department Center  06/10/2022  1:20 PM Debbe Odea, MD [facility] None  06/15/2022 10:00 AM Delano Metz, MD [facility] None  08/26/2022 12:00 PM Karie Schwalbe, MD [facility]    Duration of encounter: 67 minutes.  Total time on encounter, as per AMA guidelines included both the face-to-face and non-face-to-face time personally spent by the physician and/or other qualified health care professional(s) on the day of the encounter (includes time in activities that require the physician or other qualified health care professional and does not include time in activities normally performed by clinical staff). Physician's time may include the following activities when performed: Preparing to see the patient (e.g., pre-charting review of records, searching for previously ordered imaging, lab work, and nerve conduction tests) Review of prior analgesic pharmacotherapies. Reviewing PMP Interpreting ordered tests (e.g., lab work, imaging, nerve conduction tests) Performing  post-procedure evaluations, including interpretation of diagnostic procedures Obtaining and/or reviewing separately obtained history Performing a medically appropriate examination and/or evaluation Counseling and educating the patient/family/caregiver Ordering medications, tests, or procedures Referring and communicating with other health care professionals (when not separately reported) Documenting clinical information in the electronic or other  health record Independently interpreting results (not separately reported) and communicating results to the patient/ family/caregiver Care coordination (not separately reported)  Note by: Oswaldo Done, MD (TTS technology used. I apologize for any typographical errors that were not detected and corrected.) Date: 05/30/2022; Time: 1:54 PM

## 2022-05-30 ENCOUNTER — Ambulatory Visit (HOSPITAL_BASED_OUTPATIENT_CLINIC_OR_DEPARTMENT_OTHER): Payer: Medicare HMO | Admitting: Pain Medicine

## 2022-05-30 ENCOUNTER — Ambulatory Visit
Admission: RE | Admit: 2022-05-30 | Discharge: 2022-05-30 | Disposition: A | Discharge status: Home / Self Care | Payer: Medicare HMO | Source: Ambulatory Visit | Attending: Pain Medicine | Admitting: Pain Medicine

## 2022-05-30 VITALS — BP 134/49 | HR 65 | Temp 96.3°F | Resp 16 | Ht 63.0 in | Wt 99.0 lb

## 2022-05-30 DIAGNOSIS — R519 Headache, unspecified: Secondary | ICD-10-CM

## 2022-05-30 DIAGNOSIS — G4486 Cervicogenic headache: Secondary | ICD-10-CM | POA: Insufficient documentation

## 2022-05-30 DIAGNOSIS — M25511 Pain in right shoulder: Secondary | ICD-10-CM | POA: Diagnosis not present

## 2022-05-30 DIAGNOSIS — M48061 Spinal stenosis, lumbar region without neurogenic claudication: Secondary | ICD-10-CM

## 2022-05-30 DIAGNOSIS — G8929 Other chronic pain: Secondary | ICD-10-CM | POA: Diagnosis not present

## 2022-05-30 DIAGNOSIS — M5030 Other cervical disc degeneration, unspecified cervical region: Secondary | ICD-10-CM | POA: Insufficient documentation

## 2022-05-30 DIAGNOSIS — M25561 Pain in right knee: Secondary | ICD-10-CM

## 2022-05-30 DIAGNOSIS — M25552 Pain in left hip: Secondary | ICD-10-CM

## 2022-05-30 DIAGNOSIS — M25512 Pain in left shoulder: Secondary | ICD-10-CM

## 2022-05-30 DIAGNOSIS — M5137 Other intervertebral disc degeneration, lumbosacral region: Secondary | ICD-10-CM | POA: Diagnosis not present

## 2022-05-30 DIAGNOSIS — Z79899 Other long term (current) drug therapy: Secondary | ICD-10-CM | POA: Insufficient documentation

## 2022-05-30 DIAGNOSIS — M25562 Pain in left knee: Secondary | ICD-10-CM | POA: Insufficient documentation

## 2022-05-30 DIAGNOSIS — M79672 Pain in left foot: Secondary | ICD-10-CM | POA: Insufficient documentation

## 2022-05-30 DIAGNOSIS — M899 Disorder of bone, unspecified: Secondary | ICD-10-CM | POA: Insufficient documentation

## 2022-05-30 DIAGNOSIS — M4310 Spondylolisthesis, site unspecified: Secondary | ICD-10-CM

## 2022-05-30 DIAGNOSIS — M47816 Spondylosis without myelopathy or radiculopathy, lumbar region: Secondary | ICD-10-CM | POA: Diagnosis not present

## 2022-05-30 DIAGNOSIS — M5481 Occipital neuralgia: Secondary | ICD-10-CM | POA: Insufficient documentation

## 2022-05-30 DIAGNOSIS — M1712 Unilateral primary osteoarthritis, left knee: Secondary | ICD-10-CM | POA: Diagnosis not present

## 2022-05-30 DIAGNOSIS — M79671 Pain in right foot: Secondary | ICD-10-CM | POA: Diagnosis not present

## 2022-05-30 DIAGNOSIS — M47817 Spondylosis without myelopathy or radiculopathy, lumbosacral region: Secondary | ICD-10-CM | POA: Diagnosis not present

## 2022-05-30 DIAGNOSIS — M1711 Unilateral primary osteoarthritis, right knee: Secondary | ICD-10-CM | POA: Diagnosis not present

## 2022-05-30 DIAGNOSIS — M542 Cervicalgia: Secondary | ICD-10-CM | POA: Diagnosis not present

## 2022-05-30 DIAGNOSIS — Z79891 Long term (current) use of opiate analgesic: Secondary | ICD-10-CM

## 2022-05-30 DIAGNOSIS — G894 Chronic pain syndrome: Secondary | ICD-10-CM | POA: Diagnosis not present

## 2022-05-30 DIAGNOSIS — M25551 Pain in right hip: Secondary | ICD-10-CM | POA: Insufficient documentation

## 2022-05-30 DIAGNOSIS — M545 Low back pain, unspecified: Secondary | ICD-10-CM | POA: Insufficient documentation

## 2022-05-30 DIAGNOSIS — M4316 Spondylolisthesis, lumbar region: Secondary | ICD-10-CM | POA: Diagnosis not present

## 2022-05-30 DIAGNOSIS — Z789 Other specified health status: Secondary | ICD-10-CM | POA: Insufficient documentation

## 2022-05-30 DIAGNOSIS — M797 Fibromyalgia: Secondary | ICD-10-CM

## 2022-05-30 DIAGNOSIS — M19071 Primary osteoarthritis, right ankle and foot: Secondary | ICD-10-CM | POA: Diagnosis not present

## 2022-05-30 DIAGNOSIS — M19072 Primary osteoarthritis, left ankle and foot: Secondary | ICD-10-CM | POA: Diagnosis not present

## 2022-05-30 DIAGNOSIS — M546 Pain in thoracic spine: Secondary | ICD-10-CM | POA: Diagnosis not present

## 2022-05-30 DIAGNOSIS — M549 Dorsalgia, unspecified: Secondary | ICD-10-CM | POA: Diagnosis not present

## 2022-05-30 DIAGNOSIS — M51379 Other intervertebral disc degeneration, lumbosacral region without mention of lumbar back pain or lower extremity pain: Secondary | ICD-10-CM

## 2022-05-30 DIAGNOSIS — M47812 Spondylosis without myelopathy or radiculopathy, cervical region: Secondary | ICD-10-CM | POA: Insufficient documentation

## 2022-05-30 DIAGNOSIS — F1320 Sedative, hypnotic or anxiolytic dependence, uncomplicated: Secondary | ICD-10-CM | POA: Insufficient documentation

## 2022-05-30 DIAGNOSIS — F1120 Opioid dependence, uncomplicated: Secondary | ICD-10-CM | POA: Insufficient documentation

## 2022-05-30 DIAGNOSIS — E559 Vitamin D deficiency, unspecified: Secondary | ICD-10-CM | POA: Insufficient documentation

## 2022-05-30 DIAGNOSIS — M160 Bilateral primary osteoarthritis of hip: Secondary | ICD-10-CM | POA: Diagnosis not present

## 2022-05-30 NOTE — Patient Instructions (Signed)
____________________________________________________________________________________________  New Patients  Welcome to [facility].   Initial Visit The first or initial visit consists of an evaluation only.   Interventional pain management.  We offer therapies other than opioid controlled substances to manage chronic pain. These include, but are not limited to, diagnostic, therapeutic, and palliative specialized injection therapies (i.e.: Epidural Steroids, Facet Blocks, etc.). We specialize in a variety of nerve blocks as well as radiofrequency treatments. We offer pain implant evaluations and trials, as well as follow up management. In addition we also provide a variety joint injections, including Viscosupplementation (AKA: Gel Therapy).  Prescription Pain Medication. We specialize in alternatives to opioids. We can provide evaluations and recommendations for/of pharmacologic therapies based on CDC Guidelines.  We no longer take patients for long-term medication management. We will not be taking over your pain medications.  ____________________________________________________________________________________________    ____________________________________________________________________________________________  Patient Information update  To: All of our patients.  Re: Name change.  It has been made official that our current name, "[facility]"   will soon be changed to "[facility]".   The purpose of this change is to eliminate any confusion created by the concept of our practice being a "Medication Management Pain Clinic". In the past this has led to the misconception that we treat pain primarily by the use of prescription medications.  Nothing can be farther from the truth.   Understanding PAIN MANAGEMENT: To  further understand what our practice does, you first have to understand that "Pain Management" is a subspecialty that requires additional training once a physician has completed their specialty training, which can be in either Anesthesia, Neurology, Psychiatry, or Physical Medicine and Rehabilitation (PMR). Each one of these contributes to the final approach taken by each physician to the management of their patient's pain. To be a "Pain Management Specialist" you must have first completed one of the specialty trainings below.  Anesthesiologists - trained in clinical pharmacology and interventional techniques such as nerve blockade and regional as well as central neuroanatomy. They are trained to block pain before, during, and after surgical interventions.  Neurologists - trained in the diagnosis and pharmacological treatment of complex neurological conditions, such as Multiple Sclerosis, Parkinson's, spinal cord injuries, and other systemic conditions that may be associated with symptoms that may include but are not limited to pain. They tend to rely primarily on the treatment of chronic pain using prescription medications.  Psychiatrist - trained in conditions affecting the psychosocial wellbeing of patients including but not limited to depression, anxiety, schizophrenia, personality disorders, addiction, and other substance use disorders that may be associated with chronic pain. They tend to rely primarily on the treatment of chronic pain using prescription medications.   Physical Medicine and Rehabilitation (PMR) physicians, also known as physiatrists - trained to treat a wide variety of medical conditions affecting the brain, spinal cord, nerves, bones, joints, ligaments, muscles, and tendons. Their training is primarily aimed at treating patients that have suffered injuries that have caused severe physical impairment. Their training is primarily aimed at the physical therapy and rehabilitation of those  patients. They may also work alongside orthopedic surgeons or neurosurgeons using their expertise in assisting surgical patients to recover after their surgeries.  INTERVENTIONAL PAIN MANAGEMENT is sub-subspecialty of Pain Management.  Our physicians are Board-certified in Anesthesia, Pain Management, and Interventional Pain Management.  This meaning that not only have they been trained   and Board-certified in their specialty of Anesthesia, and subspecialty of Pain Management, but they have also received further training in the sub-subspecialty of Interventional Pain Management, in order to become Board-certified as INTERVENTIONAL PAIN MANAGEMENT SPECIALIST.    Mission: Our goal is to use our skills in  INTERVENTIONAL PAIN MANAGEMENT as alternatives to the chronic use of prescription opioid medications for the treatment of pain. To make this more clear, we have changed our name to reflect what we do and offer. We will continue to offer medication management assessment and recommendations, but we will not be taking over any patient's medication management.  ____________________________________________________________________________________________     

## 2022-05-30 NOTE — Progress Notes (Signed)
Safety precautions to be maintained throughout the outpatient stay will include: orient to surroundings, keep bed in low position, maintain call bell within reach at all times, provide assistance with transfer out of bed and ambulation.  

## 2022-06-01 ENCOUNTER — Telehealth: Payer: Self-pay | Admitting: Internal Medicine

## 2022-06-01 NOTE — Telephone Encounter (Signed)
Pt called in stated she has change insurance to [facility] provided fax# [phone] for PCP to approved  medication Refills

## 2022-06-01 NOTE — Telephone Encounter (Signed)
Did she have medications that needed refilled?

## 2022-06-02 LAB — COMPLIANCE DRUG ANALYSIS, UR

## 2022-06-02 NOTE — Telephone Encounter (Signed)
Thank you :)

## 2022-06-02 NOTE — Telephone Encounter (Signed)
Patient returned called and stated that she do not need any refills on medications now.She said that [employer] just told her to reach out to her doctor to let them know that she would be using their pharmacy.She was confused as to why they wanted her to call Dr Lindsey Ward honestly.

## 2022-06-03 ENCOUNTER — Ambulatory Visit (INDEPENDENT_AMBULATORY_CARE_PROVIDER_SITE_OTHER): Payer: Medicare HMO | Admitting: Internal Medicine

## 2022-06-03 ENCOUNTER — Telehealth: Payer: Self-pay | Admitting: Internal Medicine

## 2022-06-03 ENCOUNTER — Encounter: Payer: Self-pay | Admitting: Internal Medicine

## 2022-06-03 VITALS — BP 136/86 | HR 75 | Temp 97.1°F | Ht 63.0 in | Wt 100.0 lb

## 2022-06-03 DIAGNOSIS — R21 Rash and other nonspecific skin eruption: Secondary | ICD-10-CM

## 2022-06-03 LAB — 25-HYDROXY VITAMIN D LCMS D2+D3
25-Hydroxy, Vitamin D-2: <1 ng/mL
25-Hydroxy, Vitamin D-3: 40 ng/mL
25-Hydroxy, Vitamin D: 40 ng/mL

## 2022-06-03 LAB — MAGNESIUM: Magnesium: 2.3 mg/dL (ref 1.6–2.3)

## 2022-06-03 LAB — C-REACTIVE PROTEIN: CRP: 2 mg/L (ref 0–10)

## 2022-06-03 LAB — VITAMIN B12: Vitamin B-12: 433 pg/mL (ref 232–1245)

## 2022-06-03 LAB — SEDIMENTATION RATE: Sed Rate: 16 mm/hr (ref 0–40)

## 2022-06-03 MED ORDER — HYDROCORTISONE 2.5 % EX CREA: TOPICAL_CREAM | Freq: Three times a day (TID) | CUTANEOUS | 3 refills | Status: DC | PRN

## 2022-06-03 NOTE — Telephone Encounter (Signed)
Patient called in and stated that she has something coming up on her skin. She stated that she wants to see a dermatologist but not sure who she should go to or if she could go to urgent care. Please advise. Thank you!

## 2022-06-03 NOTE — Assessment & Plan Note (Signed)
Looks irritative Not seborrhea Reassured--not cancer Will try hydrocort 2.5% cream  3 lesions on left flank----doubt shingles but she will let me know if they spread or become painful

## 2022-06-03 NOTE — Telephone Encounter (Signed)
Spoke to pt. Made her an appt today at 1130.

## 2022-06-03 NOTE — Progress Notes (Signed)
Subjective:    Patient ID: Lindsey Ward, female    DOB: [birth year], 72 y.o.   MRN: [id number]  HPI Here due to rash  Has a rash to the side of her left eye Thinks it was there some even 1 week ago Some help with vaseline and neosporin No itching No new facial products  Did notice 3 bumps under left arm No pain  Current Outpatient Medications on File Prior to Visit  Medication Sig Dispense Refill   ALPRAZolam (XANAX) 1 MG tablet Take 1 tablet (1 mg total) by mouth 3 (three) times daily as needed. 90 tablet 0   diphenhydrAMINE (BENADRYL) 25 mg capsule Take 25 mg by mouth every 6 (six) hours as needed.     HYDROcodone-acetaminophen (NORCO/VICODIN) 5-325 MG tablet Take 1 tablet by mouth 3 (three) times daily as needed. Dx Code M79.7 90 tablet 0   Menthol, Topical Analgesic, (BIOFREEZE EX) Apply 1 application  topically as needed.     omeprazole (PRILOSEC) 20 MG capsule TAKE 1 CAPSULE BY MOUTH EVERY DAY 90 capsule 3   Polyethyl Glycol-Propyl Glycol (SYSTANE OP) Apply to eye as needed.     promethazine (PHENERGAN) 12.5 MG tablet Take 1-2 tablets (12.5-25 mg total) by mouth 3 (three) times daily as needed for nausea or vomiting. 60 tablet 0   SUMAtriptan (IMITREX) 50 MG tablet TAKE 0.5-1 TABLET BY MOUTH DAILY AS NEEDED FOR MIGRAINE. 9 tablet 11   TOBRADEX ophthalmic ointment SMARTSIG:Sparingly Right Eye Twice Daily     No current facility-administered medications on file prior to visit.    Allergies  Allergen Reactions   Tizanidine Other (See Comments)    "throws me in a different world"   Aspirin     REACTION: GI bleed   Buspirone Hcl     REACTION: unspecified   Ciprofloxacin     REACTION: unspecified   Citalopram Other (See Comments)    Ear ringing.    Codeine    Diazepam    Duloxetine     REACTION: sz like activity   Ibuprofen     REACTION: GI bleed   Imipramine Hcl Other (See Comments)    Ear ringing.    Oxycodone-Acetaminophen     REACTION: unspecified    Pregabalin     REACTION: kept her up and confusion   Propoxyphene Hcl    Risperidone     REACTION: confusion   Savella [Milnacipran] Other (See Comments)    Body wouldn't move, SOB, etc   Sulfonamide Derivatives     REACTION: unspecified   Tramadol Hcl     REACTION: doesn't remember what happened but couldn't tolerate   Tramadol Hcl Other (See Comments)    REACTION: doesn't remember what happened but couldn't tolerate REACTION: doesn't remember what happened but couldn't tolerate REACTION: doesn't remember what happened but couldn't tolerate    Venlafaxine     REACTION: unspecified   Methocarbamol Rash   Savella [Milnacipran Hcl] Palpitations and Other (See Comments)    Dizziness    Past Medical History:  Diagnosis Date   Bowel obstruction (HCC)    Chronic fatigue    Depression    Fibromyalgia    GERD (gastroesophageal reflux disease)    Hyperlipemia    Migraine    Obstipation    Osteopenia    PUD (peptic ulcer disease)     Past Surgical History:  Procedure Laterality Date   ABDOMINAL SURGERY     APPENDECTOMY  2005   BREAST BIOPSY Bilateral 1999  rt w/out clip - neg, lt w/clip - neg   BREAST CYST ASPIRATION Right 2003   neg   CHOLECYSTECTOMY  2004   COLONOSCOPY WITH PROPOFOL N/A 06/09/2016   Procedure: COLONOSCOPY WITH PROPOFOL;  Surgeon: Jonathon Bellows, MD;  Location: [facility];  Service: Endoscopy;  Laterality: N/A;   ESOPHAGOGASTRODUODENOSCOPY (EGD) WITH PROPOFOL N/A 06/09/2016   Procedure: ESOPHAGOGASTRODUODENOSCOPY (EGD) WITH PROPOFOL;  Surgeon: Jonathon Bellows, MD;  Location: [facility];  Service: Endoscopy;  Laterality: N/A;   EYE SURGERY     GIVENS CAPSULE STUDY N/A 06/23/2016   Procedure: GIVENS CAPSULE STUDY;  Surgeon: Jonathon Bellows, MD;  Location: [facility];  Service: Endoscopy;  Laterality: N/A;   PARS PLANA VITRECTOMY W/ REPAIR OF MACULAR HOLE Left 01/2017   [facility]   TOTAL ABDOMINAL HYSTERECTOMY W/ BILATERAL SALPINGOOPHORECTOMY  1986    Family  History  Problem Relation Age of Onset   Heart disease Mother        AMI   Stroke Mother    Heart disease Father    Hypertension Sister    Breast cancer Sister    Hypertension Sister    Heart disease Sister        MI   Diabetes Sister    Arthritis/Rheumatoid Sister    COPD Sister    Kidney cancer Neg Hx    Kidney disease Neg Hx    Prostate cancer Neg Hx     Social History   Socioeconomic History   Marital status: Single    Spouse name: Not on file   Number of children: 1   Years of education: Not on file   Highest education level: Not on file  Occupational History   Occupation: DISABLED  Tobacco Use   Smoking status: Former    Packs/day: 1.00    Years: 40.00    Additional pack years: 0.00    Total pack years: 40.00    Types: Cigarettes    Quit date: 09/25/2013    Years since quitting: 8.6    Passive exposure: Never   Smokeless tobacco: Never   Tobacco comments:       Vaping Use   Vaping Use: Never used  Substance and Sexual Activity   Alcohol use: Not Currently    Alcohol/week: 0.0 standard drinks of alcohol    Comment: rarely   Drug use: No   Sexual activity: Not on file  Other Topics Concern   Not on file  Social History Narrative   Living alone again      No living will   Daughter should make health care decisions   Requests DNR---done 10/24/14   No tube feeds if cognitively unaware   Highest level of education: 9th   Social Determinants of Health   Financial Resource Strain: Not on file  Food Insecurity: Not on file  Transportation Needs: Not on file  Physical Activity: Not on file  Stress: Not on file  Social Connections: Not on file  Intimate Partner Violence: Not on file   Review of Systems Entire body feels bad today No fever    Objective:   Physical Exam Constitutional:      Appearance: Normal appearance.  HENT:     Head:     Comments: No scalp lesions or rash Skin:    Comments: Patch of non specific redness in left  preauricular area--?irritative  Has 3 isolated papules in left flank--- anterior axillary line over T5-7 area or so  Neurological:     Mental Status: She is  alert.            Assessment & Plan:

## 2022-06-07 ENCOUNTER — Telehealth: Payer: Self-pay | Admitting: Cardiology

## 2022-06-07 NOTE — Telephone Encounter (Signed)
LMOV to call back and either reschedule ECHO or just confirm appointment

## 2022-06-10 ENCOUNTER — Ambulatory Visit: Payer: Medicare HMO | Attending: Cardiology | Admitting: Cardiology

## 2022-06-10 ENCOUNTER — Encounter: Payer: Self-pay | Admitting: Cardiology

## 2022-06-10 VITALS — BP 138/70 | HR 58 | Ht 63.0 in | Wt 100.6 lb

## 2022-06-10 DIAGNOSIS — R002 Palpitations: Secondary | ICD-10-CM

## 2022-06-10 DIAGNOSIS — I2510 Atherosclerotic heart disease of native coronary artery without angina pectoris: Secondary | ICD-10-CM | POA: Diagnosis not present

## 2022-06-10 DIAGNOSIS — K2100 Gastro-esophageal reflux disease with esophagitis, without bleeding: Secondary | ICD-10-CM | POA: Diagnosis not present

## 2022-06-10 DIAGNOSIS — I2584 Coronary atherosclerosis due to calcified coronary lesion: Secondary | ICD-10-CM

## 2022-06-10 NOTE — Patient Instructions (Signed)
Medication Instructions:   Your physician recommends that you continue on your current medications as directed. Please refer to the Current Medication list given to you today.  *If you need a refill on your cardiac medications before your next appointment, please call your pharmacy*   Lab Work:  None Ordered  If you have labs (blood work) drawn today and your tests are completely normal, you will receive your results only by: MyChart Message (if you have MyChart) OR A paper copy in the mail If you have any lab test that is abnormal or we need to change your treatment, we will call you to review the results.   Testing/Procedures:  None Ordered   Follow-Up: At [facility], you and your health needs are our priority.  As part of our continuing mission to provide you with exceptional heart care, we have created designated Provider Care Teams.  These Care Teams include your primary Cardiologist (physician) and Advanced Practice Providers (APPs -  Physician Assistants and Nurse Practitioners) who all work together to provide you with the care you need, when you need it.  We recommend signing up for the patient portal called "MyChart".  Sign up information is provided on this After Visit Summary.  MyChart is used to connect with patients for Virtual Visits (Telemedicine).  Patients are able to view lab/test results, encounter notes, upcoming appointments, etc.  Non-urgent messages can be sent to your provider as well.   To learn more about what you can do with MyChart, go to [email or web address].    Your next appointment:  AS NEEDED 

## 2022-06-10 NOTE — Progress Notes (Signed)
Cardiology Office Note:    Date:  06/10/2022   ID:  Lindsey Ward, DOB 04-20-1950, MRN [id number]  PCP:  Venia Carbon, MD   [facility] Cardiologist:  Kate Sable, MD     Referring MD: Venia Carbon, MD   Chief Complaint  Patient presents with   Follow-up    6 month f/u, ECHO not completed, occasional palpitations     History of Present Illness:    Lindsey Ward is a 72 y.o. female with a hx of GERD, former smoker x25+ years, coronary calcification (LAD and left circumflex calcification on chest CT 2019 ), who presents for follow-up.  Previously seen with symptoms of palpitations.    Cardiac monitor was ordered to evaluate any significant arrhythmia.  Symptoms" about 4 times monthly.  Denies any dizziness, denies syncope, shortness of breath.  Has been feeling okay, has a history of muscle aches due to fibromyalgia.  Denies any new concerns at this time.   Past Medical History:  Diagnosis Date   Bowel obstruction (HCC)    Chronic fatigue    Depression    Fibromyalgia    GERD (gastroesophageal reflux disease)    Hyperlipemia    Migraine    Obstipation    Osteopenia    PUD (peptic ulcer disease)     Past Surgical History:  Procedure Laterality Date   ABDOMINAL SURGERY     APPENDECTOMY  2005   BREAST BIOPSY Bilateral 1999   rt w/out clip - neg, lt w/clip - neg   BREAST CYST ASPIRATION Right 2003   neg   CHOLECYSTECTOMY  2004   COLONOSCOPY WITH PROPOFOL N/A 06/09/2016   Procedure: COLONOSCOPY WITH PROPOFOL;  Surgeon: Jonathon Bellows, MD;  Location: [facility];  Service: Endoscopy;  Laterality: N/A;   ESOPHAGOGASTRODUODENOSCOPY (EGD) WITH PROPOFOL N/A 06/09/2016   Procedure: ESOPHAGOGASTRODUODENOSCOPY (EGD) WITH PROPOFOL;  Surgeon: Jonathon Bellows, MD;  Location: [facility];  Service: Endoscopy;  Laterality: N/A;   EYE SURGERY     GIVENS CAPSULE STUDY N/A 06/23/2016   Procedure: GIVENS CAPSULE STUDY;  Surgeon: Jonathon Bellows, MD;  Location:  [facility];  Service: Endoscopy;  Laterality: N/A;   PARS PLANA VITRECTOMY W/ REPAIR OF MACULAR HOLE Left 01/2017   [name]   TOTAL ABDOMINAL HYSTERECTOMY W/ BILATERAL SALPINGOOPHORECTOMY  1986    Current Medications: Current Meds  Medication Sig   ALPRAZolam (XANAX) 1 MG tablet Take 1 tablet (1 mg total) by mouth 3 (three) times daily as needed.   diphenhydrAMINE (BENADRYL) 25 mg capsule Take 25 mg by mouth every 6 (six) hours as needed.   HYDROcodone-acetaminophen (NORCO/VICODIN) 5-325 MG tablet Take 1 tablet by mouth 3 (three) times daily as needed. Dx Code M79.7   hydrocortisone 2.5 % cream Apply topically 3 (three) times daily as needed.   Menthol, Topical Analgesic, (BIOFREEZE EX) Apply 1 application  topically as needed.   omeprazole (PRILOSEC) 20 MG capsule TAKE 1 CAPSULE BY MOUTH EVERY DAY   Polyethyl Glycol-Propyl Glycol (SYSTANE OP) Apply to eye as needed.   promethazine (PHENERGAN) 12.5 MG tablet Take 1-2 tablets (12.5-25 mg total) by mouth 3 (three) times daily as needed for nausea or vomiting.   SUMAtriptan (IMITREX) 50 MG tablet TAKE 0.5-1 TABLET BY MOUTH DAILY AS NEEDED FOR MIGRAINE.     Allergies:   Tizanidine, Aspirin, Buspirone hcl, Ciprofloxacin, Citalopram, Codeine, Diazepam, Duloxetine, Ibuprofen, Imipramine hcl, Oxycodone-acetaminophen, Pregabalin, Propoxyphene hcl, Risperidone, Savella [milnacipran], Sulfonamide derivatives, Tramadol hcl, Tramadol hcl, Venlafaxine, Methocarbamol, and Savella [  milnacipran hcl]   Social History   Socioeconomic History   Marital status: Single    Spouse name: Not on file   Number of children: 1   Years of education: Not on file   Highest education level: Not on file  Occupational History   Occupation: DISABLED  Tobacco Use   Smoking status: Former    Packs/day: 1.00    Years: 40.00    Additional pack years: 0.00    Total pack years: 40.00    Types: Cigarettes    Quit date: 09/25/2013    Years since quitting: 8.7     Passive exposure: Never   Smokeless tobacco: Never   Tobacco comments:       Vaping Use   Vaping Use: Never used  Substance and Sexual Activity   Alcohol use: Not Currently    Alcohol/week: 0.0 standard drinks of alcohol    Comment: rarely   Drug use: No   Sexual activity: Not on file  Other Topics Concern   Not on file  Social History Narrative   Living alone again      No living will   Daughter should make health care decisions   Requests DNR---done 10/24/14   No tube feeds if cognitively unaware   Highest level of education: 9th   Social Determinants of Health   Financial Resource Strain: Not on file  Food Insecurity: Not on file  Transportation Needs: Not on file  Physical Activity: Not on file  Stress: Not on file  Social Connections: Not on file     Family History: The patient's family history includes Arthritis/Rheumatoid in her sister; Breast cancer in her sister; COPD in her sister; Diabetes in her sister; Heart disease in her father, mother, and sister; Hypertension in her sister and sister; Stroke in her mother. There is no history of Kidney cancer, Kidney disease, or Prostate cancer.  ROS:   Please see the history of present illness.     All other systems reviewed and are negative.  EKGs/Labs/Other Studies Reviewed:    The following studies were reviewed today:   EKG:  EKG is  ordered today.  The ekg ordered today demonstrates sinus rhythm, heart rate 58, otherwise normal ECG.  Recent Labs: 04/21/2022: ALT 12; BUN 16; Creatinine, Ser 0.69; Hemoglobin 12.4; Platelets 298.0; Potassium 3.9; Sodium 139; TSH 1.44 05/30/2022: Magnesium 2.3  Recent Lipid Panel    Component Value Date/Time   CHOL 231 (H) 03/08/2016 1243   TRIG 129.0 03/08/2016 1243   HDL 42.40 03/08/2016 1243   CHOLHDL 5 03/08/2016 1243   VLDL 25.8 03/08/2016 1243   LDLCALC 162 (H) 03/08/2016 1243   LDLDIRECT 153.7 10/10/2012 1139     Risk Assessment/Calculations:         Physical  Exam:    VS:  BP 138/70 (BP Location: Left Arm, Patient Position: Sitting, Cuff Size: Normal)   Pulse (!) 58   Ht 5\' 3"  (1.6 m)   Wt 100 lb 9.6 oz (45.6 kg)   SpO2 98%   BMI 17.82 kg/m     Wt Readings from Last 3 Encounters:  06/10/22 100 lb 9.6 oz (45.6 kg)  06/03/22 100 lb (45.4 kg)  05/30/22 99 lb (44.9 kg)     GEN:  Well nourished, well developed in no acute distress HEENT: Normal NECK: No JVD; No carotid bruits CARDIAC: RRR, no murmurs, rubs, gallops RESPIRATORY:  Clear to auscultation without rales, wheezing or rhonchi  ABDOMEN: Soft, non-tender, non-distended MUSCULOSKELETAL:  No  edema; varicose veins, nontender SKIN: Warm and dry NEUROLOGIC:  Alert and oriented x 3 PSYCHIATRIC:  Normal affect   ASSESSMENT:    1. Palpitations   2. Gastroesophageal reflux disease with esophagitis without hemorrhage   3. Coronary artery calcification    PLAN:    In order of problems listed above:  Palpitations, cardiac monitor showed occasional paroxysmal SVT.  No significant sustained arrhythmias.  Due to several medication intolerances, and no significant arrhythmias, will avoid adding beta-blocker.  EKG shows sinus bradycardia, heart rate 58 today. LAD and left circumflex calcifications on chest CT in 2019, patient intolerant to several medications including aspirin.  Low-cholesterol diet emphasized.  Has baseline muscle aches from fibromyalgia. GERD, controlled with PPI, continue omeprazole.  Follow-up as needed.      Medication Adjustments/Labs and Tests Ordered: Current medicines are reviewed at length with the patient today.  Concerns regarding medicines are outlined above.  Orders Placed This Encounter  Procedures   EKG 12-Lead   No orders of the defined types were placed in this encounter.   Patient Instructions  Medication Instructions:   Your physician recommends that you continue on your current medications as directed. Please refer to the Current Medication  list given to you today.  *If you need a refill on your cardiac medications before your next appointment, please call your pharmacy*   Lab Work:  None Ordered  If you have labs (blood work) drawn today and your tests are completely normal, you will receive your results only by: [place] (if you have MyChart) OR A paper copy in the mail If you have any lab test that is abnormal or we need to change your treatment, we will call you to review the results.   Testing/Procedures:  None Ordered    Follow-Up: At [facility], you and your health needs are our priority.  As part of our continuing mission to provide you with exceptional heart care, we have created designated Provider Care Teams.  These Care Teams include your primary Cardiologist (physician) and Advanced Practice Providers (APPs -  Physician Assistants and Nurse Practitioners) who all work together to provide you with the care you need, when you need it.  We recommend signing up for the patient portal called "MyChart".  Sign up information is provided on this After Visit Summary.  MyChart is used to connect with patients for Virtual Visits (Telemedicine).  Patients are able to view lab/test results, encounter notes, upcoming appointments, etc.  Non-urgent messages can be sent to your provider as well.   To learn more about what you can do with MyChart, go to [email or web address].    Your next appointment:    AS NEEDED    Signed, Kate Sable, MD  06/10/2022 1:55 PM    [place]

## 2022-06-13 ENCOUNTER — Telehealth: Payer: Self-pay | Admitting: Internal Medicine

## 2022-06-13 NOTE — Telephone Encounter (Signed)
I did not call her. I am at another office today. Can we get more information, please?

## 2022-06-13 NOTE — Progress Notes (Unsigned)
PROVIDER NOTE: Information contained herein reflects review and annotations entered in association with encounter. Interpretation of such information and data should be left to medically-trained personnel. Information provided to patient can be located elsewhere in the medical record under "Patient Instructions". Document created using STT-dictation technology, any transcriptional errors that may result from process are unintentional.    Patient: Lindsey Ward  Service Category: E/M  Provider: Gaspar Cola, MD  DOB: May 27, 1950  DOS: 06/15/2022  Referring Provider: Venia Carbon, MD  MRN: [id number]  Specialty: Interventional Pain Management  PCP: Venia Carbon, MD  Type: Established Patient  Setting: Ambulatory outpatient    Location: Office  Delivery: Face-to-face     Primary Reason(s) for Visit: Encounter for evaluation before starting new chronic pain management plan of care (Level of risk: moderate) CC: Generalized Body Aches (everywhere)  HPI  Lindsey Ward is a 72 y.o. year old, female patient, who comes today for a follow-up evaluation to review the test results and decide on a treatment plan. She has Hyperlipidemia; Mood disorder; PANIC ATTACKS; Atypical migraine; Gastroesophageal reflux disease; Fibromyalgia; OSTEOPENIA; Sleep disturbance; Routine general medical examination at a health care facility; Chronic neck pain (2ry area of Pain) (posterior) (Bilateral) (R>L); Advance directive discussed with patient; Chronic low back pain (1ry area of Pain) (Bilateral) (R>L) w/o sciatica; LPRD (laryngopharyngeal reflux disease); Constipation; Iron deficiency anemia; Narcotic dependence; Benzodiazepine dependence; Aortic atherosclerosis; Coronary artery calcification seen on CT scan; Macular hole, left eye; Chronic hip pain and (3ry area of Pain) (Bilateral) (R>L); Occipital headache (Bilateral); Chronic feet pain (5th area of Pain) (Bilateral) (R>L); Varicose veins of lower extremities without  ulcer or inflammation, bilateral; Trochanteric bursitis of hip (Left); Chronic pain syndrome; Malnutrition of mild degree; Pharmacologic therapy; Disorder of skeletal system; Problems influencing health status; Vitamin D insufficiency; DDD (degenerative disc disease), cervical; DDD (degenerative disc disease), lumbosacral; Grade 1 Retrolisthesis of L2/L3 (48mm), L3-L4, L4/L5; Lumbar lateral recess stenosis (Right: L2-3) (Left: L4-5); Lumbosacral facet arthropathy (L5-S1); Osteoarthritis of facet joint of lumbar spine; Osteoarthritis of facet joint of cervical spine; Long term prescription benzodiazepine use; Chronic knee pain (4th area of Pain) (Bilateral) (L>R); Chronic shoulder pain (6th area of Pain) (Bilateral) (R>L); Cervicogenic headache (7th area of Pain) (Bilateral); Greater occipital neuralgia (Bilateral); Cervico-occipital neuralgia (Bilateral); Rash; Chronic lower extremity pain (Bilateral); Levoscoliosis of lumbar spine; Abnormal MRI, lumbar spine (08/11/2020); and Chronic generalized pain disorder on their problem list. Her primarily concern today is the Generalized Body Aches (everywhere)  Pain Assessment: Location: Right, Left, Upper, Mid, Lower Generalized Radiating: pain is everywhere Onset: More than a month ago Duration: Chronic pain Quality: Aching, Burning, Throbbing, Constant, Tightness, Radiating, Discomfort, Stabbing, Shooting Severity: 7 /10 (subjective, self-reported pain score)  Effect on ADL: limits my daily activities Timing: Constant Modifying factors: Meds BP: (!) 129/43  HR: (!) 59  Lindsey Ward comes in today for a follow-up visit after her initial evaluation on 05/30/2022. Today we went over the results of her tests. These were explained in "Layman's terms". During today's appointment we went over my diagnostic impression, as well as the proposed treatment plan.  Review of initial evaluation (05/30/2022): "The patient comes into the clinic today accompanied by her sister.   According to the patient the primary area of pain is that of the lower back (Bilateral) (R>L).  She denies any prior surgeries.  She does indicate having had some physical therapy around 2001 for her fibromyalgia which included physical therapy for the lower back.  She also  indicates having had some nerve blocks done by Dr. Sharlet Salina at the [facility].  She indicates that the nerve blocks by Dr. Sharlet Salina did not help.  She denies any recent x-rays of the lumbar spine.   The patient's secondary area of pain is that of the neck (posterior) (Bilateral) (R>L).  She denies any prior cervical surgery.  She describes having had some injections done by Dr. Sharlet Salina [facility]).  She indicates that the injections done by Dr. Sharlet Salina did not help.  She denies having had any recent x-rays of the cervical spine.  She also denies any physical therapy.   According to the patient the third area pain is that of the hips (Bilateral) (R>L).  She denies any prior hip surgery, physical therapy, joint injections, or recent x-rays.   The patient's fourth area pain is that of the knees (Bilateral) (L>R).  She denies any prior knee surgeries, joint injections, or recent x-rays.  She does indicate having had the physical therapy in 2001 where they worked on the knees as well but the physical therapy did not help.   The patient's fifth area pain is that of her feet (Bilateral) (R>L).  She describes having had 2 prior surgeries in each feet where they did bunionectomies.  They had to go in on the second time to remove the rest of the tissue but the surgery was successful in helping with the problem that she had at the time.  She denies any physical therapy, recent x-rays, or nerve blocks or joint injections in her feet.   The patient's sixth area pain is that of the shoulders (Bilateral) (R>L).  She denies any prior shoulder surgeries, recent x-rays, joint injections or nerve blocks, but she indicates that the  physical therapy that she did in 2001 included working on the shoulders and again it did not help.   The patient's seventh area pain is that of her headaches which are located at the very top of her head and they usually start in the occipital region.  These headaches are described to be bilateral.  She denies any prior surgeries, x-rays, or nerve blocks.  Headaches seem to follow the distribution of the greater occipital nerve, bilaterally.   The patient's eighth complaint of pain is that of fibromyalgia with generalized pain.  The patient indicates that she was given disability secondary to this fibromyalgia which was the reason why she stopped working.  She states that she will experience generalized pain where she ends up in bed for close to 2 weeks and has very poor quality of life.  She denies any regular exercising and she also states constantly having very low energy levels.  The patient lives alone and she is having difficulty maintaining her activities of daily living.   In general, the patient indicates having generalized myalgias and arthralgias.   Pharmacotherapy: The patient uses benzodiazepines (alprazolam) as well as opioid analgesics (hydrocodone/APAP) on a daily basis."  Today Lindsey Ward comes in indicating generalized pain.  She is having difficulty pinpointing where is it that she hurts.  This prompted me to have a long conversation with the patient regarding the difficulty to treat "pain everywhere".  I also explained to her that this is the reason why we take a systematic approach to her different problems starting with the worst pain and going down to the least bad pain.  I also took the time to explain to the patient the issues with the diagnosis of "fibromyalgia" with  the fact that they are all other conditions that can explain the pain that she is having and that she is attributing to that fibromyalgia.  Review of ordered tests on 05/30/2022: The lab work ordered came back within  normal limits.  UDS confirmed coexistence of benzodiazepine and opioids in the system.  Diagnostic x-rays of the cervical spine with flexion and extension views indicated C4-5 through C6-7 degenerative disc disease; grade 1 anterolisthesis of C7 over T1 (stable); grade 1 anterolisthesis of C2 over C3 and C3 over C4 on flexion views, which resolved on extension and neutral views (dynamic); multilevel neural foraminal narrowing; multilevel facet joint hypertrophy and spurring contributing to right C5-6 greater than C3-4 and C4-5 and left C4-5 greater than C5-6 and C6-7 foraminal stenosis.  Diagnostic x-rays of the right foot show postsurgical changes of the great toe (bunionectomy) and distal fifth metatarsal osteotomy.  Mild grade toe metatarsophalangeal joint osteoarthritis.  Diagnostic x-rays of the left foot show postsurgical changes of the great toe (bunionectomy) with bone loss at the distal aspect of the proximal phalanx of the fifth toe, similar to the contralateral foot imaging, presumably postsurgical or posttraumatic.  Diagnostic x-rays of the hips show mild bilateral femoro-acetabular osteoarthritis.  Diagnostic x-rays of the right knee show mild degenerative changes in the patellofemoral compartment.  Diagnostic x-rays of the left knee show minimal medial compartment joint space narrowing.  Diagnostic x-rays of the lumbar spine with bending views show levocurvature centered at L2-3 with moderate right L2-3 degenerative disc changes.  Mild to moderate L3-4 and mild L4-5 degenerative disc changes.  Mild retrolisthesis of L2 over L3 (stable).  Diagnostic x-rays of both shoulders were read as negative.  Physical exam: The patient was able to heel walk and toe walk without any problems.  She was also able to flex the hips bilaterally, again without any significant problems.  Hyperextension and rotation of the lumbar spine however was positive bilaterally for ipsilateral lumbar facet arthralgia.  This was  also concordant with the results of the [facility] maneuver.  Provocative Patrick maneuver was negative bilaterally for sacroiliac joint pain but positive bilaterally for hip joint arthralgia and arthropathy with significant decreased range of motion of both hips and reproduction of the hip pain, also bilaterally.  In the case of the left hip it referred pain towards the anterior groin area and in the case of the right hip he referred towards the posterior buttocks area.  Patient also had tenderness to palpation over both trochanteric bursa's.  Lateral bending of the lumbar spine actually trigger some pain going down the left lower extremity when she bent the spine towards that side.  This could suggest a left-sided foraminal stenosis.  Based on the patient's complaints and results of her imaging, today I have offered her a left-sided L2-3 LESI #1 under fluoroscopic guidance.  The patient was informed of the above plan.  She indicated having understood and accepted.   Patient presented with interventional treatment options. Lindsey Ward was informed that I will not be providing medication management. Pharmacotherapy evaluation including recommendations may be offered, if specifically requested.   Controlled Substance Pharmacotherapy Assessment REMS (Risk Evaluation and Mitigation Strategy)  Opioid Analgesic: No chronic opioid analgesics therapy prescribed by our practice. Hydrocodone/APAP 5/325, 1 tab p.o. 3 times daily (# 90) (last filled on 05/12/2022) MME/day: 15 mg/day  Pill Count: None expected due to no prior prescriptions written by our practice. Chauncey Fischer, RN  06/15/2022 10:08 AM  Sign when Signing Visit  Safety precautions to be maintained throughout the outpatient stay will include: orient to surroundings, keep bed in low position, maintain call bell within reach at all times, provide assistance with transfer out of bed and ambulation.    Pharmacokinetics: Liberation and absorption (onset of  action): WNL Distribution (time to peak effect): WNL Metabolism and excretion (duration of action): WNL         Pharmacodynamics: Desired effects: Analgesia: Ms. Roehrig reports >50% benefit. Functional ability: Patient reports that medication allows her to accomplish basic ADLs Clinically meaningful improvement in function (CMIF): Sustained CMIF goals met Perceived effectiveness: Described as relatively effective, allowing for increase in activities of daily living (ADL) Undesirable effects: Side-effects or Adverse reactions: None reported Monitoring: [place] PMP: PDMP reviewed during this encounter. Online review of the past 12-month period previously conducted. Not applicable at this point since we have not taken over the patient's medication management yet. List of other Serum/Urine Drug Screening Test(s):  No results found for: "AMPHSCRSER", "BARBSCRSER", "BENZOSCRSER", "COCAINSCRSER", "COCAINSCRNUR", "PCPSCRSER", "THCSCRSER", "THCU", "CANNABQUANT", "OPIATESCRSER", "OXYSCRSER", "PROPOXSCRSER", "ETH", "CBDTHCR", "D8THCCBX", "D9THCCBX" List of all UDS test(s) done:  Lab Results  Component Value Date   TOXASSSELUR FINAL 06/09/2016   SUMMARY Note 05/30/2022   Last UDS on record: ToxAssure Select 13  Date Value Ref Range Status  06/09/2016 FINAL  Final    Comment:    ==================================================================== TOXASSURE SELECT 13 (MW) ==================================================================== Specimen Alert Note:  Urinary creatinine is low; ability to detect some drugs may be compromised.  Interpret results with caution. ==================================================================== Test                             Result       Flag       Units Drug Present and Declared for Prescription Verification   Alprazolam                     776          EXPECTED   ng/mg creat   Alpha-hydroxyalprazolam        900          EXPECTED   ng/mg creat    Source  of alprazolam is a scheduled prescription medication.    Alpha-hydroxyalprazolam is an expected metabolite of alprazolam.   Hydrocodone                    1329         EXPECTED   ng/mg creat   Hydromorphone                  306          EXPECTED   ng/mg creat   Norhydrocodone                 1000         EXPECTED   ng/mg creat    Sources of hydrocodone include scheduled prescription    medications. Hydromorphone and norhydrocodone are expected    metabolites of hydrocodone. Hydromorphone is also available as a    scheduled prescription medication. ==================================================================== Test                      Result    Flag   Units      Ref Range   Creatinine              17  L      mg/dL      >=20 ==================================================================== Declared Medications:  The flagging and interpretation on this report are based on the  following declared medications.  Unexpected results may arise from  inaccuracies in the declared medications.  **Note: The testing scope of this panel includes these medications:  Alprazolam  Hydrocodone (Hydrocodone-Acetaminophen)  **Note: The testing scope of this panel does not include following  reported medications:  Acetaminophen (Hydrocodone-Acetaminophen)  Cetirizine  Cholecalciferol  Fluticasone  Gabapentin  Iron (Ferrous Sulfate)  Methocarbamol  Omeprazole  Polyethylene Glycol  Sumatriptan  Supplement (Probiotic) ==================================================================== For clinical consultation, please call [phone]. ====================================================================    Summary  Date Value Ref Range Status  05/30/2022 Note  Final    Comment:    ==================================================================== Compliance Drug Analysis, Ur ==================================================================== Test                              Result       Flag       Units  Drug Present and Declared for Prescription Verification   Alprazolam                     401          EXPECTED   ng/mg creat   Alpha-hydroxyalprazolam        656          EXPECTED   ng/mg creat    Source of alprazolam is a scheduled prescription medication. Alpha-    hydroxyalprazolam is an expected metabolite of alprazolam.    Hydrocodone                    1122         EXPECTED   ng/mg creat   Hydromorphone                  302          EXPECTED   ng/mg creat   Dihydrocodeine                 84           EXPECTED   ng/mg creat   Norhydrocodone                 766          EXPECTED   ng/mg creat    Sources of hydrocodone include scheduled prescription medications.    Hydromorphone, dihydrocodeine and norhydrocodone are expected    metabolites of hydrocodone. Hydromorphone and dihydrocodeine are    also available as scheduled prescription medications.    Acetaminophen                  PRESENT      EXPECTED  Drug Absent but Declared for Prescription Verification   Diphenhydramine                Not Detected UNEXPECTED   Promethazine                   Not Detected UNEXPECTED ==================================================================== Test                      Result    Flag   Units      Ref Range   Creatinine              95  mg/dL      >=20 ==================================================================== Declared Medications:  The flagging and interpretation on this report are based on the  following declared medications.  Unexpected results may arise from  inaccuracies in the declared medications.   **Note: The testing scope of this panel includes these medications:   Alprazolam (Xanax)  Diphenhydramine (Benadryl)  Hydrocodone (Norco)  Promethazine (Phenergan)   **Note: The testing scope of this panel does not include small to  moderate amounts of these reported medications:   Acetaminophen (Norco)   **Note: The  testing scope of this panel does not include the  following reported medications:   Dexamethasone (Tobradex)  Menthol  Omeprazole (Prilosec)  Polyethylene Glycol  Sumatriptan (Imitrex)  Tobramycin (Tobradex) ==================================================================== For clinical consultation, please call [phone]. ====================================================================    UDS interpretation: No unexpected findings.          Medication Assessment Form: Not applicable. No opioids. Treatment compliance: Not applicable Risk Assessment Profile: Aberrant behavior: See initial evaluations. None observed or detected today Comorbid factors increasing risk of overdose: See initial evaluation. No additional risks detected today Opioid risk tool (ORT):     05/30/2022   12:55 PM  Opioid Risk   Alcohol 0  Illegal Drugs 0  Rx Drugs 0  Alcohol 0  Illegal Drugs 0  Rx Drugs 0  Psychological Disease 2  ADD Negative  OCD Negative  Bipolar Negative  Depression 1  Opioid Risk Tool Scoring 3  Opioid Risk Interpretation Low Risk    ORT Scoring interpretation table:  Score <3 = Low Risk for SUD  Score between 4-7 = Moderate Risk for SUD  Score >8 = High Risk for Opioid Abuse   Risk of substance use disorder (SUD): Low  Risk Mitigation Strategies:  Patient opioid safety counseling: No controlled substances prescribed. Patient-Prescriber Agreement (PPA): No agreement signed.  Controlled substance notification to other providers: None required. No opioid therapy.  Pharmacologic Plan: Non-opioid analgesic therapy offered. Interventional alternatives discussed.             Laboratory Chemistry Profile   Renal Lab Results  Component Value Date   BUN 16 04/21/2022   CREATININE 0.69 04/21/2022   GFR 85.96 04/21/2022   GFRAA >60 10/05/2017   GFRNONAA >60 10/08/2021   SPECGRAV <=1.005 (A) 02/22/2022   PHUR 6.0 02/22/2022   PROTEINUR Negative 02/22/2022      Electrolytes Lab Results  Component Value Date   NA 139 04/21/2022   K 3.9 04/21/2022   CL 102 04/21/2022   CALCIUM 9.5 04/21/2022   MG 2.3 05/30/2022   PHOS 3.7 03/25/2010     Hepatic Lab Results  Component Value Date   AST 25 04/21/2022   ALT 12 04/21/2022   ALBUMIN 4.1 04/21/2022   ALKPHOS 87 04/21/2022   LIPASE 31 12/18/2020     ID Lab Results  Component Value Date   HCVAB NEGATIVE 06/03/2015     Bone Lab Results  Component Value Date   VD25OH 24.90 (L) 04/21/2022   25OHVITD1 40 05/30/2022   25OHVITD2 <1.0 05/30/2022   25OHVITD3 40 05/30/2022     Endocrine Lab Results  Component Value Date   GLUCOSE 94 04/21/2022   GLUCOSEU NEGATIVE 12/18/2020   TSH 1.44 04/21/2022   FREET4 1.03 06/22/2021     Neuropathy Lab Results  Component Value Date   [id number] 05/30/2022     CNS No results found for: "COLORCSF", "APPEARCSF", "RBCCOUNTCSF", "WBCCSF", "POLYSCSF", "LYMPHSCSF", "EOSCSF", "PROTEINCSF", "GLUCCSF", "JCVIRUS", "CSFOLI", "IGGCSF", "LABACHR", "ACETBL"  Inflammation (CRP: Acute  ESR: Chronic) Lab Results  Component Value Date   CRP 2 05/30/2022   ESRSEDRATE 16 05/30/2022     Rheumatology No results found for: "RF", "ANA", "LABURIC", "URICUR", "LYMEIGGIGMAB", "LYMEABIGMQN", "HLAB27"   Coagulation Lab Results  Component Value Date   PLT 298.0 04/21/2022     Cardiovascular Lab Results  Component Value Date   CKTOTAL 88 12/24/2015   CKMB 1.6 04/30/2008   TROPONINI <0.03 10/05/2017   HGB 12.4 04/21/2022   HCT 35.8 (L) 04/21/2022     Screening Lab Results  Component Value Date   HCVAB NEGATIVE 06/03/2015     Cancer No results found for: "CEA", "CA125", "LABCA2"   Allergens No results found for: "ALMOND", "APPLE", "ASPARAGUS", "AVOCADO", "BANANA", "BARLEY", "BASIL", "BAYLEAF", "GREENBEAN", "LIMABEAN", "WHITEBEAN", "BEEFIGE", "REDBEET", "BLUEBERRY", "BROCCOLI", "CABBAGE", "MELON", "CARROT", "CASEIN", "CASHEWNUT", "CAULIFLOWER",  "CELERY"     Note: Lab results reviewed.  Recent Diagnostic Imaging Review  Cervical Imaging: Cervical MR wo contrast: Results for orders placed during the hospital encounter of 10/05/06 MR Cervical Spine Wo Contrast  Narrative Clinical Data: Neck pain and limited range of motion. MRI CERVICAL SPINE WITHOUT CONTRAST: Technique:  Multiplanar and multiecho pulse sequences of the cervical spine, to include the craniocervical junction and cervicothoracic junction, were obtained according to standard protocol without IV contrast. Findings: There is no abnormality at the foramen magnum, C1-2, or C2-3. At C3-4, there is minimal uncovertebral degeneration but no significant finding. At C4-5, there is moderate uncovertebral degeneration bilaterally. No effect upon the cord. Mild foraminal encroachment bilaterally. At C5-6, there is moderate uncovertebral degeneration bilaterally. No effect upon the cord. Mild foraminal encroachment bilaterally. At C6-7, there is mild uncovertebral degeneration on the left and moderate on the right. No effect upon the cord. Mild foraminal encroachment. At C7-T1, there is minimal disk bulge but no herniation or stenosis. There is minimal facet degeneration. The upper thoracic region is negative. Cervical spinal cord shows normal signal characteristics throughout.  Impression Mild uncovertebral degeneration throughout the mid portion of the cervical spine as described above. There is no soft disk herniation. There is no spinal stenosis and no effect upon the cord. Uncovertebral osteophytes encroach upon the foramina mildly in the mid cervical region but there is no pronounced foraminal stenosis.  Provider: Mary B. Foster  Cervical DG Bending/F/E views: Results for orders placed during the hospital encounter of 05/30/22 DG Cervical Spine With Flex & Extend  Narrative CLINICAL DATA:  Axial pain with possible radicular component.  EXAM: CERVICAL SPINE COMPLETE  WITH FLEXION AND EXTENSION VIEWS  COMPARISON:  Cervical spine radiographs 09/04/2018  FINDINGS: There is visualization of C1 through T1 on lateral view. There is 2 mm grade 1 anterolisthesis of C7 on T1 on neutral view that is unchanged on flexion and extension views and unchanged from prior 09/04/2018 radiographs.  On flexion view there is 2 mm grade 1 anterolisthesis of C2 on C3 and 2 mm grade 1 anterolisthesis of C3 on C4 that resolves on extension and neutral views. This is also unchanged from prior.  Vertebral body heights are maintained the atlantodens interval is intact.  Mild-to-moderate C4-5 through C6-7 posterior disc space narrowing and mild-to-moderate posterior endplate osteophytosis.  Facet joint hypertrophy and spurring contribute to right C5-6 greater than C3-4 and C4-5 and left C4-5 greater than C5-6 and C6-7 neuroforaminal narrowing.  No prevertebral soft tissue swelling. The lung apices are clear. Moderate calcifications within the aortic arch. Mild calcifications overlying the right carotid bulb.  IMPRESSION: 1.  Mild-to-moderate C4-5 through C6-7 degenerative disc and endplate changes. 2. Mild grade 1 anterolisthesis of C7 on T1 that is unchanged on neutral, flexion, and extension view. 3. Mild grade 1 anterolisthesis of C2 on C3 and C3 on C4 on flexion view that resolves on extension and neutral views. 4. Multilevel neuroforaminal narrowing as above.   Electronically Signed By: Yvonne Kendall M.D. On: 06/01/2022 09:31  Shoulder Imaging: Shoulder-R DG: Results for orders placed during the hospital encounter of 05/30/22 DG Shoulder Right  Narrative CLINICAL DATA:  Pain  EXAM: RIGHT SHOULDER - 2+ VIEW  COMPARISON:  None Available.  FINDINGS: There is no evidence of fracture or dislocation. There is no evidence of arthropathy or other focal bone abnormality. Soft tissues are unremarkable.  IMPRESSION: Negative.   Electronically  Signed By: Dorise Bullion III M.D. On: 05/31/2022 09:44  Shoulder-L DG: Results for orders placed during the hospital encounter of 05/30/22 DG Shoulder Left  Narrative CLINICAL DATA:  Pain  EXAM: LEFT SHOULDER - 2+ VIEW  COMPARISON:  None Available.  FINDINGS: There is no evidence of fracture or dislocation. There is no evidence of arthropathy or other focal bone abnormality. Soft tissues are unremarkable.  IMPRESSION: Negative.   Electronically Signed By: Dorise Bullion III M.D. On: 05/31/2022 18:01  Lumbosacral Imaging: Lumbar MR wo contrast: Results for orders placed during the hospital encounter of 08/11/20 MR LUMBAR SPINE WO CONTRAST  Narrative CLINICAL DATA:  Low back pain.  Buttock and leg pain  EXAM: MRI LUMBAR SPINE WITHOUT CONTRAST  TECHNIQUE: Multiplanar, multisequence MR imaging of the lumbar spine was performed. No intravenous contrast was administered.  COMPARISON:  CT lumbar spine 06/12/2014  FINDINGS: Segmentation:  Normal  Alignment:  Mild retrolisthesis L2-3 and L3-4.  Mild levoscoliosis.  Vertebrae:  Negative for fracture or mass.  Conus medullaris and cauda equina: Conus extends to the L1 level. Conus and cauda equina appear normal.  Paraspinal and other soft tissues: Dilated common bile duct 11 mm. This is unchanged from the prior CT 2016. Postop cholecystectomy.  No paraspinous mass or adenopathy.  Disc levels:  T12-L1: Negative  L1-2: Negative  L2-3: Asymmetric disc degeneration on the right with disc space narrowing and spurring on the right. Mild right subarticular stenosis. Spinal canal normal in size  L3-4: Mild disc degeneration asymmetric to the right. No significant spinal or foraminal stenosis.  L4-5: Mild retrolisthesis. Diffuse disc and facet degeneration. Moderate left subarticular stenosis due to spurring.  L5-S1: Mild disc and facet degeneration.  Negative for stenosis.  IMPRESSION: Mild right  subarticular stenosis at L2-3 due to asymmetric disc degeneration and spurring  Moderate subarticular stenosis on the left at L4-5 due to spurring  Dilated common bile duct 11 mm, unchanged from 2016.   Electronically Signed By: Franchot Gallo M.D. On: 08/11/2020 17:10  Lumbar DG Bending views: Results for orders placed during the hospital encounter of 05/30/22 DG Lumbar Spine Complete W/Bend  Narrative CLINICAL DATA:  Lower back pain.  Positive radicular component.  EXAM: LUMBAR SPINE - COMPLETE WITH BENDING VIEWS  COMPARISON:  MRI lumbar spine 08/11/2020  FINDINGS: There are 5 non-rib-bearing lumbar-type vertebral bodies. There is mild levocurvature centered at L2-3, similar to prior. Moderate right L2-3 disc space narrowing with right-greater-than-left L2-3 endplate osteophytes seen on frontal view.  Vertebral body heights are maintained.  There is 3 mm retrolisthesis of L2 on L3, unchanged on neutral, flexion, and extension view.  Mild-to-moderate L3-4 and mild posterior L4-5 disc space narrowing.  Facet joint arthropathy is greatest at L5-S1.  Right upper quadrant cholecystectomy clips.  IMPRESSION: 1. Mild levocurvature centered at L2-3 with moderate right L2-3 degenerative disc changes. 2. Mild-to-moderate L3-4 and mild L4-5 degenerative disc changes. 3. Mild retrolisthesis of L2 on L3, unchanged on dynamic views.   Electronically Signed By: Yvonne Kendall M.D. On: 06/01/2022 09:45  Hip Imaging: Hip-R DG 2-3 views: Results for orders placed during the hospital encounter of 05/30/22 DG HIP UNILAT W OR W/O PELVIS 2-3 VIEWS RIGHT  Narrative CLINICAL DATA:  Pain.  EXAM: DG HIP (WITH OR WITHOUT PELVIS) 2-3V LEFT; DG HIP (WITH OR WITHOUT PELVIS) 2-3V RIGHT  COMPARISON:  CT abdomen and pelvis 12/18/2020  FINDINGS: There is diffuse decreased bone mineralization.  Mild superomedial bilateral femoroacetabular joint space narrowing. Mild superolateral  bilateral acetabular degenerative osteophytosis. Minimal superior pubic symphysis joint space narrowing and subchondral sclerosis. The bilateral sacroiliac joint spaces are maintained.  Surgical suture overlies the right hemipelvis, likely from prior appendectomy seen on prior CT.  IMPRESSION: Mild bilateral femoroacetabular osteoarthritis.   Electronically Signed By: Yvonne Kendall M.D. On: 06/01/2022 09:26  Hip-L DG 2-3 views: Results for orders placed during the hospital encounter of 05/30/22 DG HIP UNILAT W OR W/O PELVIS 2-3 VIEWS LEFT  Narrative CLINICAL DATA:  Pain.  EXAM: DG HIP (WITH OR WITHOUT PELVIS) 2-3V LEFT; DG HIP (WITH OR WITHOUT PELVIS) 2-3V RIGHT  COMPARISON:  CT abdomen and pelvis 12/18/2020  FINDINGS: There is diffuse decreased bone mineralization.  Mild superomedial bilateral femoroacetabular joint space narrowing. Mild superolateral bilateral acetabular degenerative osteophytosis. Minimal superior pubic symphysis joint space narrowing and subchondral sclerosis. The bilateral sacroiliac joint spaces are maintained.  Surgical suture overlies the right hemipelvis, likely from prior appendectomy seen on prior CT.  IMPRESSION: Mild bilateral femoroacetabular osteoarthritis.   Electronically Signed By: Yvonne Kendall M.D. On: 06/01/2022 09:26  Knee Imaging: Knee-R DG 4 views: Results for orders placed during the hospital encounter of 05/30/22 DG Knee Complete 4 Views Right  Narrative CLINICAL DATA:  Pain  EXAM: RIGHT KNEE - COMPLETE 4+ VIEW  COMPARISON:  None Available.  FINDINGS: Mild degenerative changes in the patellofemoral compartment. No other bony or soft tissue abnormalities are noted. No effusion.  IMPRESSION: Mild degenerative changes in the patellofemoral compartment.   Electronically Signed By: Dorise Bullion III M.D. On: 05/31/2022 15:11  Knee-L DG 4 views: Results for orders placed during the hospital encounter of  05/30/22 DG Knee Complete 4 Views Left  Narrative CLINICAL DATA:  Left knee pain/arthralgia.  EXAM: LEFT KNEE - COMPLETE 4+ VIEW  COMPARISON:  Left knee radiographs 11/09/2016.  FINDINGS: Minimal medial compartment joint space narrowing. No joint effusion. No acute fracture or dislocation.  IMPRESSION: Minimal medial compartment joint space narrowing.   Electronically Signed By: Yvonne Kendall M.D. On: 06/01/2022 09:47  Foot Imaging: Foot-R DG Complete: Results for orders placed during the hospital encounter of 05/30/22 DG Foot Complete Right  Narrative CLINICAL DATA:  Right foot pain.  EXAM: RIGHT FOOT COMPLETE - 3+ VIEW  COMPARISON:  Right foot radiographs 08/05/2021  FINDINGS: There is again mildly decreased bone mineralization. There is again a metallic pin overlying the distal great toe metatarsal. Postsurgical changes of partial resection of the distal medial first metatarsal with like osteotomy, bunionectomy changes. A single screw overlies an osteotomy of the distal fifth metatarsal, unchanged.  Mild-to-moderate lateral great toe metatarsophalangeal joint space narrowing with mild-to-moderate dorsal metatarsal head osteophytosis, unchanged. Unchanged chronic mild bone loss at  the distal aspect of the proximal phalanx of fifth toe, possibly postsurgical.  Small plantar calcaneal heel spur.  IMPRESSION: 1. Postsurgical changes of great toe bunionectomy and distal 5th metatarsal osteotomy, unchanged. 2. Mild great toe metatarsophalangeal joint osteoarthritis.   Electronically Signed By: Yvonne Kendall M.D. On: 06/01/2022 09:50  Foot-L DG Complete: Results for orders placed during the hospital encounter of 05/30/22 DG Foot Complete Left  Narrative CLINICAL DATA:  Left foot pain.  EXAM: LEFT FOOT - COMPLETE 3+ VIEW  COMPARISON:  Left foot radiographs 01/23/2018  FINDINGS: There is again diffuse decreased bone mineralization.  Postsurgical changes of great toe bunionectomy with a pin overlying the distal metatarsal, partial resection of the distal medial first metatarsal, and osteotomy changes.  Remote healed osteotomy of the distal fifth metatarsal, unchanged. There is bone loss at the distal aspect of the proximal phalanx of the fifth toe, similar to the contralateral foot imaged the same day and presumably postsurgical versus posttraumatic. Small plantar calcaneal heel spur.  No acute fracture or dislocation.  IMPRESSION: 1. Postsurgical changes of great toe bunionectomy. 2. Bone loss at the distal aspect of the proximal phalanx of the fifth toe, similar to the contralateral foot imaged the same day and presumably postsurgical or posttraumatic.   Electronically Signed By: Yvonne Kendall M.D. On: 06/01/2022 09:52  Complexity Note: Imaging results reviewed.                         Meds   Current Outpatient Medications:    ALPRAZolam (XANAX) 1 MG tablet, Take 1 tablet (1 mg total) by mouth 3 (three) times daily as needed., Disp: 90 tablet, Rfl: 0   diphenhydrAMINE (BENADRYL) 25 mg capsule, Take 25 mg by mouth every 6 (six) hours as needed., Disp: , Rfl:    HYDROcodone-acetaminophen (NORCO/VICODIN) 5-325 MG tablet, Take 1 tablet by mouth 3 (three) times daily as needed. Dx Code M79.7, Disp: 90 tablet, Rfl: 0   hydrocortisone 2.5 % cream, Apply topically 3 (three) times daily as needed., Disp: 28 g, Rfl: 3   Menthol, Topical Analgesic, (BIOFREEZE EX), Apply 1 application  topically as needed., Disp: , Rfl:    omeprazole (PRILOSEC) 20 MG capsule, TAKE 1 CAPSULE BY MOUTH EVERY DAY, Disp: 90 capsule, Rfl: 3   Polyethyl Glycol-Propyl Glycol (SYSTANE OP), Apply to eye as needed., Disp: , Rfl:    promethazine (PHENERGAN) 12.5 MG tablet, Take 1-2 tablets (12.5-25 mg total) by mouth 3 (three) times daily as needed for nausea or vomiting., Disp: 60 tablet, Rfl: 0   SUMAtriptan (IMITREX) 50 MG tablet, TAKE 0.5-1  TABLET BY MOUTH DAILY AS NEEDED FOR MIGRAINE., Disp: 9 tablet, Rfl: 11  ROS  Constitutional: Denies any fever or chills Gastrointestinal: No reported hemesis, hematochezia, vomiting, or acute GI distress Musculoskeletal: Denies any acute onset joint swelling, redness, loss of ROM, or weakness Neurological: No reported episodes of acute onset apraxia, aphasia, dysarthria, agnosia, amnesia, paralysis, loss of coordination, or loss of consciousness  Allergies  Lindsey Ward is allergic to tizanidine, aspirin, buspirone hcl, ciprofloxacin, citalopram, codeine, diazepam, duloxetine, ibuprofen, imipramine hcl, oxycodone-acetaminophen, pregabalin, propoxyphene hcl, risperidone, savella [milnacipran], sulfonamide derivatives, tramadol hcl, tramadol hcl, venlafaxine, methocarbamol, and savella [milnacipran hcl].  [place]  Drug: Lindsey Ward  reports no history of drug use. Alcohol:  reports that she does not currently use alcohol. Tobacco:  reports that she quit smoking about 8 years ago. Her smoking use included cigarettes. She has a 40.00 pack-year smoking  history. She has never been exposed to tobacco smoke. She has never used smokeless tobacco. Medical:  has a past medical history of Bowel obstruction, Chronic fatigue, Depression, Fibromyalgia, GERD (gastroesophageal reflux disease), Hyperlipemia, Migraine, Obstipation, Osteopenia, and PUD (peptic ulcer disease). Surgical: Lindsey Ward  has a past surgical history that includes Appendectomy (2005); Cholecystectomy (2004); Total abdominal hysterectomy w/ bilateral salpingoophorectomy (1986); Abdominal surgery; Esophagogastroduodenoscopy (egd) with propofol (N/A, 06/09/2016); Colonoscopy with propofol (N/A, 06/09/2016); Givens capsule study (N/A, 06/23/2016); Pars plana vitrectomy w/ repair of macular hole (Left, 01/2017); Eye surgery; Breast biopsy (Bilateral, 1999); and Breast cyst aspiration (Right, 2003). Family: family history includes Arthritis/Rheumatoid in her  sister; Breast cancer in her sister; COPD in her sister; Diabetes in her sister; Heart disease in her father, mother, and sister; Hypertension in her sister and sister; Stroke in her mother.  Constitutional Exam  General appearance: Well nourished, well developed, and well hydrated. In no apparent acute distress Vitals:   06/15/22 1008  BP: (!) 129/43  Pulse: (!) 59  Temp: (!) 97.1 F (36.2 C)  SpO2: 100%  Weight: 98 lb (44.5 kg)  Height: 5\' 3"  (1.6 m)   BMI Assessment: Estimated body mass index is 17.36 kg/m as calculated from the following:   Height as of this encounter: 5\' 3"  (1.6 m).   Weight as of this encounter: 98 lb (44.5 kg).  BMI interpretation table: BMI level Category Range association with higher incidence of chronic pain  <18 kg/m2 Underweight   18.5-24.9 kg/m2 Ideal body weight   25-29.9 kg/m2 Overweight Increased incidence by 20%  30-34.9 kg/m2 Obese (Class I) Increased incidence by 68%  35-39.9 kg/m2 Severe obesity (Class II) Increased incidence by 136%  >40 kg/m2 Extreme obesity (Class III) Increased incidence by 254%   Patient's current BMI Ideal Body weight  Body mass index is 17.36 kg/m. Female patients must weigh at least 45.5 kg to calculate ideal body weight   BMI Readings from Last 4 Encounters:  06/15/22 17.36 kg/m  06/10/22 17.82 kg/m  06/03/22 17.71 kg/m  05/30/22 17.54 kg/m   Wt Readings from Last 4 Encounters:  06/15/22 98 lb (44.5 kg)  06/10/22 100 lb 9.6 oz (45.6 kg)  06/03/22 100 lb (45.4 kg)  05/30/22 99 lb (44.9 kg)    Psych/Mental status: Alert, oriented x 3 (person, place, & time)       Eyes: PERLA Respiratory: No evidence of acute respiratory distress  Assessment & Plan  Primary Diagnosis & Pertinent Problem List: The primary encounter diagnosis was Chronic generalized pain disorder. Diagnoses of Chronic low back pain (1ry area of Pain) (Bilateral) (R>L) w/o sciatica, Chronic neck pain (2ry area of Pain) (posterior)  (Bilateral) (R>L), Chronic hip pain and (3ry area of Pain) (Bilateral) (R>L), Chronic knee pain (4th area of Pain) (Bilateral) (L>R), Chronic feet pain (5th area of Pain) (Bilateral) (R>L), Chronic shoulder pain (6th area of Pain) (Bilateral) (R>L), Cervicogenic headache (7th area of Pain) (Bilateral), Chronic lower extremity pain (Bilateral), Levoscoliosis of lumbar spine, Grade 1 Retrolisthesis of L2/L3 (15mm), L3-L4, L4/L5, Lumbosacral facet arthropathy (L5-S1), Abnormal MRI, lumbar spine (08/11/2020), and Fibromyalgia were also pertinent to this visit.  Visit Diagnosis: 1. Chronic generalized pain disorder   2. Chronic low back pain (1ry area of Pain) (Bilateral) (R>L) w/o sciatica   3. Chronic neck pain (2ry area of Pain) (posterior) (Bilateral) (R>L)   4. Chronic hip pain and (3ry area of Pain) (Bilateral) (R>L)   5. Chronic knee pain (4th area of Pain) (Bilateral) (L>R)  6. Chronic feet pain (5th area of Pain) (Bilateral) (R>L)   7. Chronic shoulder pain (6th area of Pain) (Bilateral) (R>L)   8. Cervicogenic headache (7th area of Pain) (Bilateral)   9. Chronic lower extremity pain (Bilateral)   10. Levoscoliosis of lumbar spine   11. Grade 1 Retrolisthesis of L2/L3 (65mm), L3-L4, L4/L5   12. Lumbosacral facet arthropathy (L5-S1)   13. Abnormal MRI, lumbar spine (08/11/2020)   14. Fibromyalgia    Problems updated and reviewed during this visit: Problem  Chronic lower extremity pain (Bilateral)  Levoscoliosis of Lumbar Spine  Abnormal MRI, lumbar spine (08/11/2020)   (08/11/2020) LUMBAR MRI FINDINGS: Alignment:  Mild retrolisthesis L2-3 and L3-4.  Mild levoscoliosis. Paraspinal and other soft tissues: Dilated common bile duct 11 mm. This is unchanged from the prior CT 2016. Postop cholecystectomy.  DISC LEVELS: L2-3: Asymmetric disc degeneration on the right with disc space narrowing and spurring on the right. Mild right subarticular stenosis. L3-4: Mild disc degeneration asymmetric to  the right. L4-5: Mild retrolisthesis. Diffuse disc and facet degeneration. Moderate left subarticular stenosis due to spurring. L5-S1: Mild disc and facet degeneration.   IMPRESSION: Mild right subarticular stenosis at L2-3 due to asymmetric disc degeneration and spurring Moderate subarticular stenosis on the left at L4-5 due to spurring   Chronic Generalized Pain Disorder  Grade 1 Retrolisthesis of L2/L3 (18mm), L3-L4, L4/L5  Lumbosacral facet arthropathy (L5-S1)  Chronic neck pain (2ry area of Pain) (posterior) (Bilateral) (R>L)    Plan of Care  Pharmacotherapy (Medications Ordered): No orders of the defined types were placed in this encounter.  Procedure Orders         Lumbar Epidural Injection     Lab Orders  No laboratory test(s) ordered today   Imaging Orders  No imaging studies ordered today   Referral Orders  No referral(s) requested today    Pharmacological management:  Opioid Analgesics: I will not be prescribing any opioids at this time Membrane stabilizer: I will not be prescribing any at this time Muscle relaxant: I will not be prescribing any at this time NSAID: I will not be prescribing any at this time Other analgesic(s): I will not be prescribing any at this time      Interventional Therapies  Risk Factors  Considerations:     Planned  Pending:   Diagnostic/Therapeutic right L2-3 LESI #1    Under consideration:   Diagnostic/Therapeutic right L2-3 LESI #1    Completed:   None at this time   Completed by other providers:   Therapeutic bilateral L4-5 TFESI x2 (08/21/2020, 09/01/2021) by Sharlet Salina, DO [facility])  Therapeutic right C4-5 TFESI x1 (03/05/2015) by Sharlet Salina, DO [facility])    Therapeutic  Palliative (PRN) options:   None established        Provider-requested follow-up: Return for (ECT): (R) L2-3 LESI #1. Recent Visits Date Type Provider Dept  05/30/22 Office Visit Milinda Pointer, MD [facility]  Showing  recent visits within past 90 days and meeting all other requirements Today's Visits Date Type Provider Dept  06/15/22 Office Visit Milinda Pointer, MD [facility]  Showing today's visits and meeting all other requirements Future Appointments No visits were found meeting these conditions. Showing future appointments within next 90 days and meeting all other requirements   Primary Care Physician: Venia Carbon, MD  Duration of encounter: 80 minutes.  Total time on encounter, as per AMA guidelines included both the face-to-face and non-face-to-face time personally spent by  the physician and/or other qualified health care professional(s) on the day of the encounter (includes time in activities that require the physician or other qualified health care professional and does not include time in activities normally performed by clinical staff). Physician's time may include the following activities when performed: Preparing to see the patient (e.g., pre-charting review of records, searching for previously ordered imaging, lab work, and nerve conduction tests) Review of prior analgesic pharmacotherapies. Reviewing PMP Interpreting ordered tests (e.g., lab work, imaging, nerve conduction tests) Performing post-procedure evaluations, including interpretation of diagnostic procedures Obtaining and/or reviewing separately obtained history Performing a medically appropriate examination and/or evaluation Counseling and educating the patient/family/caregiver Ordering medications, tests, or procedures Referring and communicating with other health care professionals (when not separately reported) Documenting clinical information in the electronic or other health record Independently interpreting results (not separately reported) and communicating results to the patient/ family/caregiver Care coordination (not separately reported)  Note by: Gaspar Cola, MD (TTS technology used. I  apologize for any typographical errors that were not detected and corrected.) Date: 06/15/2022; Time: 1:00 PM

## 2022-06-13 NOTE — Telephone Encounter (Signed)
Patient called in returning a call she received. She stated she will be home all day. Thank you!

## 2022-06-14 NOTE — Telephone Encounter (Signed)
Since there are no notes in the system as to who may have called, hopefully, it was not important and they will call her back.

## 2022-06-14 NOTE — Telephone Encounter (Signed)
She stated that no one left a message but she had a missed call.

## 2022-06-15 ENCOUNTER — Encounter: Payer: Self-pay | Admitting: Pain Medicine

## 2022-06-15 ENCOUNTER — Ambulatory Visit: Payer: Medicare HMO | Attending: Pain Medicine | Admitting: Pain Medicine

## 2022-06-15 VITALS — BP 129/43 | HR 59 | Temp 97.1°F | Ht 63.0 in | Wt 98.0 lb

## 2022-06-15 DIAGNOSIS — M25512 Pain in left shoulder: Secondary | ICD-10-CM | POA: Insufficient documentation

## 2022-06-15 DIAGNOSIS — M4186 Other forms of scoliosis, lumbar region: Secondary | ICD-10-CM | POA: Insufficient documentation

## 2022-06-15 DIAGNOSIS — M797 Fibromyalgia: Secondary | ICD-10-CM | POA: Insufficient documentation

## 2022-06-15 DIAGNOSIS — M79672 Pain in left foot: Secondary | ICD-10-CM | POA: Diagnosis not present

## 2022-06-15 DIAGNOSIS — M25562 Pain in left knee: Secondary | ICD-10-CM | POA: Diagnosis not present

## 2022-06-15 DIAGNOSIS — R937 Abnormal findings on diagnostic imaging of other parts of musculoskeletal system: Secondary | ICD-10-CM | POA: Insufficient documentation

## 2022-06-15 DIAGNOSIS — M25551 Pain in right hip: Secondary | ICD-10-CM

## 2022-06-15 DIAGNOSIS — M25552 Pain in left hip: Secondary | ICD-10-CM | POA: Diagnosis not present

## 2022-06-15 DIAGNOSIS — M25511 Pain in right shoulder: Secondary | ICD-10-CM

## 2022-06-15 DIAGNOSIS — M79671 Pain in right foot: Secondary | ICD-10-CM

## 2022-06-15 DIAGNOSIS — G4486 Cervicogenic headache: Secondary | ICD-10-CM | POA: Diagnosis not present

## 2022-06-15 DIAGNOSIS — M47817 Spondylosis without myelopathy or radiculopathy, lumbosacral region: Secondary | ICD-10-CM | POA: Diagnosis not present

## 2022-06-15 DIAGNOSIS — M25561 Pain in right knee: Secondary | ICD-10-CM | POA: Insufficient documentation

## 2022-06-15 DIAGNOSIS — M79605 Pain in left leg: Secondary | ICD-10-CM | POA: Diagnosis not present

## 2022-06-15 DIAGNOSIS — R52 Pain, unspecified: Secondary | ICD-10-CM | POA: Insufficient documentation

## 2022-06-15 DIAGNOSIS — M4310 Spondylolisthesis, site unspecified: Secondary | ICD-10-CM | POA: Diagnosis not present

## 2022-06-15 DIAGNOSIS — G8929 Other chronic pain: Secondary | ICD-10-CM | POA: Diagnosis not present

## 2022-06-15 DIAGNOSIS — M545 Low back pain, unspecified: Secondary | ICD-10-CM | POA: Diagnosis not present

## 2022-06-15 DIAGNOSIS — M4316 Spondylolisthesis, lumbar region: Secondary | ICD-10-CM | POA: Diagnosis not present

## 2022-06-15 DIAGNOSIS — M79604 Pain in right leg: Secondary | ICD-10-CM | POA: Diagnosis not present

## 2022-06-15 DIAGNOSIS — M542 Cervicalgia: Secondary | ICD-10-CM

## 2022-06-15 NOTE — Progress Notes (Signed)
Safety precautions to be maintained throughout the outpatient stay will include: orient to surroundings, keep bed in low position, maintain call bell within reach at all times, provide assistance with transfer out of bed and ambulation.  

## 2022-06-15 NOTE — Patient Instructions (Signed)

## 2022-06-22 ENCOUNTER — Other Ambulatory Visit: Payer: Self-pay | Admitting: Internal Medicine

## 2022-06-22 MED ORDER — HYDROCODONE-ACETAMINOPHEN 5-325 MG PO TABS: 1.0000 | ORAL_TABLET | Freq: Three times a day (TID) | ORAL | 0 refills | Status: DC | PRN

## 2022-06-22 NOTE — Telephone Encounter (Signed)
Prescription Request  06/22/2022  LOV: 06/03/2022  What is the name of the medication or equipment? HYDROcodone-acetaminophen (NORCO/VICODIN) 5-325 MG tablet ALPRAZolam (XANAX) 1 MG tablet   Have you contacted your pharmacy to request a refill? No   Which pharmacy would you like this sent to?  [id number] [address] [place] [address] Phone: [phone] Fax: [phone]    Patient notified that their request is being sent to the clinical staff for review and that they should receive a response within 2 business days.   Please advise at Mobile [phone] (mobile)

## 2022-06-22 NOTE — Telephone Encounter (Signed)
Name of Medication: Hydrocodone Name of Pharmacy: [facility] Last Fill or Written Date and Quantity: 05-12-22 #90 Last Office Visit and Type: 06-03-22 Next Office Visit and Type: 08-26-22 Last Controlled Substance Agreement Date: 09-29-21 Last UDS: 04-16-20

## 2022-06-23 ENCOUNTER — Other Ambulatory Visit: Payer: Self-pay | Admitting: Internal Medicine

## 2022-06-23 ENCOUNTER — Telehealth: Payer: Self-pay

## 2022-06-23 ENCOUNTER — Other Ambulatory Visit (HOSPITAL_BASED_OUTPATIENT_CLINIC_OR_DEPARTMENT_OTHER): Payer: Medicare HMO | Admitting: Pain Medicine

## 2022-06-23 DIAGNOSIS — K219 Gastro-esophageal reflux disease without esophagitis: Secondary | ICD-10-CM

## 2022-06-23 DIAGNOSIS — M25551 Pain in right hip: Secondary | ICD-10-CM

## 2022-06-23 DIAGNOSIS — M25561 Pain in right knee: Secondary | ICD-10-CM

## 2022-06-23 DIAGNOSIS — M545 Low back pain, unspecified: Secondary | ICD-10-CM

## 2022-06-23 DIAGNOSIS — M542 Cervicalgia: Secondary | ICD-10-CM

## 2022-06-23 DIAGNOSIS — M25562 Pain in left knee: Secondary | ICD-10-CM

## 2022-06-23 DIAGNOSIS — G8929 Other chronic pain: Secondary | ICD-10-CM

## 2022-06-23 DIAGNOSIS — M25552 Pain in left hip: Secondary | ICD-10-CM

## 2022-06-23 NOTE — Telephone Encounter (Signed)
Insurance Treatment Denial Note  Date order was entered:  Order entered by: Delano Metz, MD Requested treatment: LESI Reason for denial: Documentation of Physical Therapy is missing. Recommended for approval:  PT   I called the patient and she is not interested in PT. She tried it years ago and it didn't help. Unfortunately I cannot get an approval without PT. She said she understands.

## 2022-07-04 ENCOUNTER — Encounter: Payer: Self-pay | Admitting: Internal Medicine

## 2022-07-04 DIAGNOSIS — G8929 Other chronic pain: Secondary | ICD-10-CM

## 2022-07-05 ENCOUNTER — Other Ambulatory Visit: Payer: Self-pay | Admitting: Internal Medicine

## 2022-07-05 NOTE — Telephone Encounter (Signed)
Prescription Request  07/05/2022  LOV: 06/03/2022  What is the name of the medication or equipment?  ALPRAZolam (XANAX) 1 MG tablet   promethazine (PHENERGAN) 12.5 MG tablet  Have you contacted your pharmacy to request a refill? Yes   Which pharmacy would you like this sent to?  [id number] [address] [place] [address] Phone: [phone] Fax: [phone]    Patient notified that their request is being sent to the clinical staff for review and that they should receive a response within 2 business days.   Please advise at [facility] [phone]

## 2022-07-06 DIAGNOSIS — M25512 Pain in left shoulder: Secondary | ICD-10-CM | POA: Diagnosis not present

## 2022-07-06 DIAGNOSIS — I700 Atherosclerosis of aorta: Secondary | ICD-10-CM | POA: Diagnosis not present

## 2022-07-06 DIAGNOSIS — M25561 Pain in right knee: Secondary | ICD-10-CM | POA: Diagnosis not present

## 2022-07-06 DIAGNOSIS — M797 Fibromyalgia: Secondary | ICD-10-CM | POA: Diagnosis not present

## 2022-07-06 DIAGNOSIS — M47817 Spondylosis without myelopathy or radiculopathy, lumbosacral region: Secondary | ICD-10-CM | POA: Diagnosis not present

## 2022-07-06 DIAGNOSIS — D509 Iron deficiency anemia, unspecified: Secondary | ICD-10-CM | POA: Diagnosis not present

## 2022-07-06 DIAGNOSIS — M4186 Other forms of scoliosis, lumbar region: Secondary | ICD-10-CM | POA: Diagnosis not present

## 2022-07-06 DIAGNOSIS — M25562 Pain in left knee: Secondary | ICD-10-CM | POA: Diagnosis not present

## 2022-07-06 DIAGNOSIS — Z87891 Personal history of nicotine dependence: Secondary | ICD-10-CM | POA: Diagnosis not present

## 2022-07-06 DIAGNOSIS — I2510 Atherosclerotic heart disease of native coronary artery without angina pectoris: Secondary | ICD-10-CM | POA: Diagnosis not present

## 2022-07-06 DIAGNOSIS — E441 Mild protein-calorie malnutrition: Secondary | ICD-10-CM | POA: Diagnosis not present

## 2022-07-06 DIAGNOSIS — M48061 Spinal stenosis, lumbar region without neurogenic claudication: Secondary | ICD-10-CM | POA: Diagnosis not present

## 2022-07-06 DIAGNOSIS — F1120 Opioid dependence, uncomplicated: Secondary | ICD-10-CM | POA: Diagnosis not present

## 2022-07-06 DIAGNOSIS — G894 Chronic pain syndrome: Secondary | ICD-10-CM | POA: Diagnosis not present

## 2022-07-06 DIAGNOSIS — F32A Depression, unspecified: Secondary | ICD-10-CM | POA: Diagnosis not present

## 2022-07-06 DIAGNOSIS — M25511 Pain in right shoulder: Secondary | ICD-10-CM | POA: Diagnosis not present

## 2022-07-06 DIAGNOSIS — F419 Anxiety disorder, unspecified: Secondary | ICD-10-CM | POA: Diagnosis not present

## 2022-07-06 MED ORDER — PROMETHAZINE HCL 12.5 MG PO TABS: 12.5000 mg | ORAL_TABLET | Freq: Three times a day (TID) | ORAL | 0 refills | Status: DC | PRN

## 2022-07-06 MED ORDER — ALPRAZOLAM 1 MG PO TABS: 1.0000 mg | ORAL_TABLET | Freq: Three times a day (TID) | ORAL | 0 refills | Status: DC | PRN

## 2022-07-07 ENCOUNTER — Telehealth: Payer: Self-pay | Admitting: Internal Medicine

## 2022-07-07 DIAGNOSIS — G894 Chronic pain syndrome: Secondary | ICD-10-CM | POA: Diagnosis not present

## 2022-07-07 DIAGNOSIS — F32A Depression, unspecified: Secondary | ICD-10-CM | POA: Diagnosis not present

## 2022-07-07 DIAGNOSIS — I2510 Atherosclerotic heart disease of native coronary artery without angina pectoris: Secondary | ICD-10-CM | POA: Diagnosis not present

## 2022-07-07 DIAGNOSIS — M25511 Pain in right shoulder: Secondary | ICD-10-CM | POA: Diagnosis not present

## 2022-07-07 DIAGNOSIS — Z87891 Personal history of nicotine dependence: Secondary | ICD-10-CM | POA: Diagnosis not present

## 2022-07-07 DIAGNOSIS — M25562 Pain in left knee: Secondary | ICD-10-CM | POA: Diagnosis not present

## 2022-07-07 DIAGNOSIS — M25512 Pain in left shoulder: Secondary | ICD-10-CM | POA: Diagnosis not present

## 2022-07-07 DIAGNOSIS — E441 Mild protein-calorie malnutrition: Secondary | ICD-10-CM | POA: Diagnosis not present

## 2022-07-07 DIAGNOSIS — M797 Fibromyalgia: Secondary | ICD-10-CM | POA: Diagnosis not present

## 2022-07-07 DIAGNOSIS — M47817 Spondylosis without myelopathy or radiculopathy, lumbosacral region: Secondary | ICD-10-CM | POA: Diagnosis not present

## 2022-07-07 DIAGNOSIS — M4186 Other forms of scoliosis, lumbar region: Secondary | ICD-10-CM | POA: Diagnosis not present

## 2022-07-07 DIAGNOSIS — I700 Atherosclerosis of aorta: Secondary | ICD-10-CM | POA: Diagnosis not present

## 2022-07-07 DIAGNOSIS — M48061 Spinal stenosis, lumbar region without neurogenic claudication: Secondary | ICD-10-CM | POA: Diagnosis not present

## 2022-07-07 DIAGNOSIS — F419 Anxiety disorder, unspecified: Secondary | ICD-10-CM | POA: Diagnosis not present

## 2022-07-07 DIAGNOSIS — D509 Iron deficiency anemia, unspecified: Secondary | ICD-10-CM | POA: Diagnosis not present

## 2022-07-07 DIAGNOSIS — M25561 Pain in right knee: Secondary | ICD-10-CM | POA: Diagnosis not present

## 2022-07-07 DIAGNOSIS — F1120 Opioid dependence, uncomplicated: Secondary | ICD-10-CM | POA: Diagnosis not present

## 2022-07-07 NOTE — Telephone Encounter (Signed)
Home Health verbal orders Caller Name:Michelle Agency Name: [facility]  Callback number: [phone]  Requesting OT/PT/Skilled nursing/Social Work/Speech: OT  Reason: independence safety with adl 's,iadls,adaptive equipment training  Frequency: 1x wk 5  Please forward to [facility] pool or providers CMA

## 2022-07-07 NOTE — Telephone Encounter (Signed)
Verbal orders given to Michelle. 

## 2022-07-08 ENCOUNTER — Telehealth: Payer: Self-pay | Admitting: Internal Medicine

## 2022-07-08 NOTE — Telephone Encounter (Signed)
Home Health verbal orders Caller Name: Tresa Endo Agency Name: [facility] number: [phone]  [facility] observations for 1x a week for 3 weeks and consult for social worker for in home aide and meals on wheels.    Please forward to [facility] pool or providers CMA

## 2022-07-08 NOTE — Telephone Encounter (Signed)
Verbal Orders left on VM.

## 2022-07-11 ENCOUNTER — Telehealth: Payer: Self-pay | Admitting: Internal Medicine

## 2022-07-11 NOTE — Telephone Encounter (Signed)
Vague verbal orders giving since VM was not verified as being [employer].

## 2022-07-11 NOTE — Telephone Encounter (Signed)
Home Health verbal orders Caller Name:Kelly Agency Name: [facility]  Callback number:[phone]   Requesting OT/PT/Skilled nursing/Social Work/Speech:  Reason:social work //skilled nursing  Frequency:  Please forward to [employer] or providers CMA

## 2022-07-12 DIAGNOSIS — G894 Chronic pain syndrome: Secondary | ICD-10-CM | POA: Diagnosis not present

## 2022-07-12 DIAGNOSIS — M48061 Spinal stenosis, lumbar region without neurogenic claudication: Secondary | ICD-10-CM | POA: Diagnosis not present

## 2022-07-12 DIAGNOSIS — D509 Iron deficiency anemia, unspecified: Secondary | ICD-10-CM | POA: Diagnosis not present

## 2022-07-12 DIAGNOSIS — Z87891 Personal history of nicotine dependence: Secondary | ICD-10-CM | POA: Diagnosis not present

## 2022-07-12 DIAGNOSIS — M4186 Other forms of scoliosis, lumbar region: Secondary | ICD-10-CM | POA: Diagnosis not present

## 2022-07-12 DIAGNOSIS — M797 Fibromyalgia: Secondary | ICD-10-CM | POA: Diagnosis not present

## 2022-07-12 DIAGNOSIS — M47817 Spondylosis without myelopathy or radiculopathy, lumbosacral region: Secondary | ICD-10-CM | POA: Diagnosis not present

## 2022-07-12 DIAGNOSIS — M25561 Pain in right knee: Secondary | ICD-10-CM | POA: Diagnosis not present

## 2022-07-12 DIAGNOSIS — M25512 Pain in left shoulder: Secondary | ICD-10-CM | POA: Diagnosis not present

## 2022-07-12 DIAGNOSIS — I700 Atherosclerosis of aorta: Secondary | ICD-10-CM | POA: Diagnosis not present

## 2022-07-12 DIAGNOSIS — M25562 Pain in left knee: Secondary | ICD-10-CM | POA: Diagnosis not present

## 2022-07-12 DIAGNOSIS — F1120 Opioid dependence, uncomplicated: Secondary | ICD-10-CM | POA: Diagnosis not present

## 2022-07-12 DIAGNOSIS — F419 Anxiety disorder, unspecified: Secondary | ICD-10-CM | POA: Diagnosis not present

## 2022-07-12 DIAGNOSIS — M25511 Pain in right shoulder: Secondary | ICD-10-CM | POA: Diagnosis not present

## 2022-07-12 DIAGNOSIS — I2510 Atherosclerotic heart disease of native coronary artery without angina pectoris: Secondary | ICD-10-CM | POA: Diagnosis not present

## 2022-07-12 DIAGNOSIS — F32A Depression, unspecified: Secondary | ICD-10-CM | POA: Diagnosis not present

## 2022-07-12 DIAGNOSIS — E441 Mild protein-calorie malnutrition: Secondary | ICD-10-CM | POA: Diagnosis not present

## 2022-07-13 DIAGNOSIS — Z87891 Personal history of nicotine dependence: Secondary | ICD-10-CM | POA: Diagnosis not present

## 2022-07-13 DIAGNOSIS — M25561 Pain in right knee: Secondary | ICD-10-CM | POA: Diagnosis not present

## 2022-07-13 DIAGNOSIS — M25512 Pain in left shoulder: Secondary | ICD-10-CM | POA: Diagnosis not present

## 2022-07-13 DIAGNOSIS — M797 Fibromyalgia: Secondary | ICD-10-CM | POA: Diagnosis not present

## 2022-07-13 DIAGNOSIS — M48061 Spinal stenosis, lumbar region without neurogenic claudication: Secondary | ICD-10-CM | POA: Diagnosis not present

## 2022-07-13 DIAGNOSIS — G894 Chronic pain syndrome: Secondary | ICD-10-CM | POA: Diagnosis not present

## 2022-07-13 DIAGNOSIS — E441 Mild protein-calorie malnutrition: Secondary | ICD-10-CM | POA: Diagnosis not present

## 2022-07-13 DIAGNOSIS — F32A Depression, unspecified: Secondary | ICD-10-CM | POA: Diagnosis not present

## 2022-07-13 DIAGNOSIS — I700 Atherosclerosis of aorta: Secondary | ICD-10-CM | POA: Diagnosis not present

## 2022-07-13 DIAGNOSIS — D509 Iron deficiency anemia, unspecified: Secondary | ICD-10-CM | POA: Diagnosis not present

## 2022-07-13 DIAGNOSIS — M25511 Pain in right shoulder: Secondary | ICD-10-CM | POA: Diagnosis not present

## 2022-07-13 DIAGNOSIS — I2510 Atherosclerotic heart disease of native coronary artery without angina pectoris: Secondary | ICD-10-CM | POA: Diagnosis not present

## 2022-07-13 DIAGNOSIS — F1120 Opioid dependence, uncomplicated: Secondary | ICD-10-CM | POA: Diagnosis not present

## 2022-07-13 DIAGNOSIS — M25562 Pain in left knee: Secondary | ICD-10-CM | POA: Diagnosis not present

## 2022-07-13 DIAGNOSIS — M4186 Other forms of scoliosis, lumbar region: Secondary | ICD-10-CM | POA: Diagnosis not present

## 2022-07-13 DIAGNOSIS — F419 Anxiety disorder, unspecified: Secondary | ICD-10-CM | POA: Diagnosis not present

## 2022-07-13 DIAGNOSIS — M47817 Spondylosis without myelopathy or radiculopathy, lumbosacral region: Secondary | ICD-10-CM | POA: Diagnosis not present

## 2022-07-19 ENCOUNTER — Telehealth: Payer: Self-pay | Admitting: Internal Medicine

## 2022-07-19 DIAGNOSIS — M25511 Pain in right shoulder: Secondary | ICD-10-CM | POA: Diagnosis not present

## 2022-07-19 DIAGNOSIS — M25561 Pain in right knee: Secondary | ICD-10-CM | POA: Diagnosis not present

## 2022-07-19 DIAGNOSIS — M4185 Other forms of scoliosis, thoracolumbar region: Secondary | ICD-10-CM | POA: Diagnosis not present

## 2022-07-19 DIAGNOSIS — F419 Anxiety disorder, unspecified: Secondary | ICD-10-CM | POA: Diagnosis not present

## 2022-07-19 DIAGNOSIS — Z87891 Personal history of nicotine dependence: Secondary | ICD-10-CM | POA: Diagnosis not present

## 2022-07-19 DIAGNOSIS — I2510 Atherosclerotic heart disease of native coronary artery without angina pectoris: Secondary | ICD-10-CM | POA: Diagnosis not present

## 2022-07-19 DIAGNOSIS — F32A Depression, unspecified: Secondary | ICD-10-CM | POA: Diagnosis not present

## 2022-07-19 DIAGNOSIS — F1120 Opioid dependence, uncomplicated: Secondary | ICD-10-CM | POA: Diagnosis not present

## 2022-07-19 DIAGNOSIS — M47817 Spondylosis without myelopathy or radiculopathy, lumbosacral region: Secondary | ICD-10-CM | POA: Diagnosis not present

## 2022-07-19 DIAGNOSIS — M25512 Pain in left shoulder: Secondary | ICD-10-CM | POA: Diagnosis not present

## 2022-07-19 DIAGNOSIS — M4186 Other forms of scoliosis, lumbar region: Secondary | ICD-10-CM | POA: Diagnosis not present

## 2022-07-19 DIAGNOSIS — G894 Chronic pain syndrome: Secondary | ICD-10-CM | POA: Diagnosis not present

## 2022-07-19 DIAGNOSIS — E441 Mild protein-calorie malnutrition: Secondary | ICD-10-CM | POA: Diagnosis not present

## 2022-07-19 DIAGNOSIS — M25562 Pain in left knee: Secondary | ICD-10-CM | POA: Diagnosis not present

## 2022-07-19 DIAGNOSIS — M48061 Spinal stenosis, lumbar region without neurogenic claudication: Secondary | ICD-10-CM | POA: Diagnosis not present

## 2022-07-19 DIAGNOSIS — D509 Iron deficiency anemia, unspecified: Secondary | ICD-10-CM | POA: Diagnosis not present

## 2022-07-19 DIAGNOSIS — M797 Fibromyalgia: Secondary | ICD-10-CM | POA: Diagnosis not present

## 2022-07-19 DIAGNOSIS — I700 Atherosclerosis of aorta: Secondary | ICD-10-CM | POA: Diagnosis not present

## 2022-07-19 NOTE — Telephone Encounter (Signed)
[facility] faxed over document Home Health Certificate (Order [place] [id number]), to be filled out by provider. Document is located in providers S-Drive.

## 2022-07-19 NOTE — Telephone Encounter (Signed)
Form printed from S-Drive and placed in Dr Karle Starch inbox on his desk.

## 2022-07-19 NOTE — Telephone Encounter (Signed)
Form signed.

## 2022-07-20 DIAGNOSIS — I2510 Atherosclerotic heart disease of native coronary artery without angina pectoris: Secondary | ICD-10-CM | POA: Diagnosis not present

## 2022-07-20 DIAGNOSIS — M25561 Pain in right knee: Secondary | ICD-10-CM | POA: Diagnosis not present

## 2022-07-20 DIAGNOSIS — F32A Depression, unspecified: Secondary | ICD-10-CM | POA: Diagnosis not present

## 2022-07-20 DIAGNOSIS — M25562 Pain in left knee: Secondary | ICD-10-CM | POA: Diagnosis not present

## 2022-07-20 DIAGNOSIS — E441 Mild protein-calorie malnutrition: Secondary | ICD-10-CM | POA: Diagnosis not present

## 2022-07-20 DIAGNOSIS — M25512 Pain in left shoulder: Secondary | ICD-10-CM | POA: Diagnosis not present

## 2022-07-20 DIAGNOSIS — D509 Iron deficiency anemia, unspecified: Secondary | ICD-10-CM | POA: Diagnosis not present

## 2022-07-20 DIAGNOSIS — M25511 Pain in right shoulder: Secondary | ICD-10-CM | POA: Diagnosis not present

## 2022-07-20 DIAGNOSIS — M797 Fibromyalgia: Secondary | ICD-10-CM | POA: Diagnosis not present

## 2022-07-20 DIAGNOSIS — F1120 Opioid dependence, uncomplicated: Secondary | ICD-10-CM | POA: Diagnosis not present

## 2022-07-20 DIAGNOSIS — F419 Anxiety disorder, unspecified: Secondary | ICD-10-CM | POA: Diagnosis not present

## 2022-07-20 DIAGNOSIS — G894 Chronic pain syndrome: Secondary | ICD-10-CM | POA: Diagnosis not present

## 2022-07-20 DIAGNOSIS — Z87891 Personal history of nicotine dependence: Secondary | ICD-10-CM | POA: Diagnosis not present

## 2022-07-20 DIAGNOSIS — M47817 Spondylosis without myelopathy or radiculopathy, lumbosacral region: Secondary | ICD-10-CM | POA: Diagnosis not present

## 2022-07-20 DIAGNOSIS — I700 Atherosclerosis of aorta: Secondary | ICD-10-CM | POA: Diagnosis not present

## 2022-07-20 DIAGNOSIS — M4186 Other forms of scoliosis, lumbar region: Secondary | ICD-10-CM | POA: Diagnosis not present

## 2022-07-20 DIAGNOSIS — M48061 Spinal stenosis, lumbar region without neurogenic claudication: Secondary | ICD-10-CM | POA: Diagnosis not present

## 2022-07-21 ENCOUNTER — Telehealth: Payer: Self-pay | Admitting: Internal Medicine

## 2022-07-21 MED ORDER — PROMETHAZINE HCL 12.5 MG PO TABS: 12.5000 mg | ORAL_TABLET | Freq: Three times a day (TID) | ORAL | 0 refills | Status: DC | PRN

## 2022-07-21 NOTE — Telephone Encounter (Signed)
Prescription Request  07/21/2022  LOV: 06/03/2022  What is the name of the medication or equipment? promethazine (PHENERGAN) 12.5 MG tablet  Have you contacted your pharmacy to request a refill? No   Which pharmacy would you like this sent to?  [id number] [address] [place] [address] Phone: [phone] Fax: [phone]    Patient notified that their request is being sent to the clinical staff for review and that they should receive a response within 2 business days.   Please advise at Mobile [phone] (mobile)

## 2022-07-21 NOTE — Telephone Encounter (Signed)
Rx sent electronically.  

## 2022-07-21 NOTE — Telephone Encounter (Signed)
Darnelle from [facility] called over and stated that there has been in a delay in social work going to see patient due to staffing shortage. She stated that they will go out to see her next week. Thank you!

## 2022-07-21 NOTE — Addendum Note (Signed)
Addended by: Eual Fines on: 07/21/2022 12:07 PM   Modules accepted: Orders

## 2022-07-27 DIAGNOSIS — G894 Chronic pain syndrome: Secondary | ICD-10-CM | POA: Diagnosis not present

## 2022-07-27 DIAGNOSIS — M4186 Other forms of scoliosis, lumbar region: Secondary | ICD-10-CM | POA: Diagnosis not present

## 2022-07-27 DIAGNOSIS — M47817 Spondylosis without myelopathy or radiculopathy, lumbosacral region: Secondary | ICD-10-CM | POA: Diagnosis not present

## 2022-07-27 DIAGNOSIS — F419 Anxiety disorder, unspecified: Secondary | ICD-10-CM | POA: Diagnosis not present

## 2022-07-27 DIAGNOSIS — M25562 Pain in left knee: Secondary | ICD-10-CM | POA: Diagnosis not present

## 2022-07-27 DIAGNOSIS — D509 Iron deficiency anemia, unspecified: Secondary | ICD-10-CM | POA: Diagnosis not present

## 2022-07-27 DIAGNOSIS — I700 Atherosclerosis of aorta: Secondary | ICD-10-CM | POA: Diagnosis not present

## 2022-07-27 DIAGNOSIS — F1120 Opioid dependence, uncomplicated: Secondary | ICD-10-CM | POA: Diagnosis not present

## 2022-07-27 DIAGNOSIS — M25561 Pain in right knee: Secondary | ICD-10-CM | POA: Diagnosis not present

## 2022-07-27 DIAGNOSIS — M25511 Pain in right shoulder: Secondary | ICD-10-CM | POA: Diagnosis not present

## 2022-07-27 DIAGNOSIS — Z87891 Personal history of nicotine dependence: Secondary | ICD-10-CM | POA: Diagnosis not present

## 2022-07-27 DIAGNOSIS — M25512 Pain in left shoulder: Secondary | ICD-10-CM | POA: Diagnosis not present

## 2022-07-27 DIAGNOSIS — M48061 Spinal stenosis, lumbar region without neurogenic claudication: Secondary | ICD-10-CM | POA: Diagnosis not present

## 2022-07-27 DIAGNOSIS — E441 Mild protein-calorie malnutrition: Secondary | ICD-10-CM | POA: Diagnosis not present

## 2022-07-27 DIAGNOSIS — I2510 Atherosclerotic heart disease of native coronary artery without angina pectoris: Secondary | ICD-10-CM | POA: Diagnosis not present

## 2022-07-27 DIAGNOSIS — M797 Fibromyalgia: Secondary | ICD-10-CM | POA: Diagnosis not present

## 2022-07-27 DIAGNOSIS — F32A Depression, unspecified: Secondary | ICD-10-CM | POA: Diagnosis not present

## 2022-07-28 DIAGNOSIS — I2510 Atherosclerotic heart disease of native coronary artery without angina pectoris: Secondary | ICD-10-CM | POA: Diagnosis not present

## 2022-07-28 DIAGNOSIS — D509 Iron deficiency anemia, unspecified: Secondary | ICD-10-CM | POA: Diagnosis not present

## 2022-07-28 DIAGNOSIS — F32A Depression, unspecified: Secondary | ICD-10-CM | POA: Diagnosis not present

## 2022-07-28 DIAGNOSIS — M25511 Pain in right shoulder: Secondary | ICD-10-CM | POA: Diagnosis not present

## 2022-07-28 DIAGNOSIS — M25562 Pain in left knee: Secondary | ICD-10-CM | POA: Diagnosis not present

## 2022-07-28 DIAGNOSIS — M4186 Other forms of scoliosis, lumbar region: Secondary | ICD-10-CM | POA: Diagnosis not present

## 2022-07-28 DIAGNOSIS — M47817 Spondylosis without myelopathy or radiculopathy, lumbosacral region: Secondary | ICD-10-CM | POA: Diagnosis not present

## 2022-07-28 DIAGNOSIS — F1120 Opioid dependence, uncomplicated: Secondary | ICD-10-CM | POA: Diagnosis not present

## 2022-07-28 DIAGNOSIS — I700 Atherosclerosis of aorta: Secondary | ICD-10-CM | POA: Diagnosis not present

## 2022-07-28 DIAGNOSIS — M25512 Pain in left shoulder: Secondary | ICD-10-CM | POA: Diagnosis not present

## 2022-07-28 DIAGNOSIS — G894 Chronic pain syndrome: Secondary | ICD-10-CM | POA: Diagnosis not present

## 2022-07-28 DIAGNOSIS — F419 Anxiety disorder, unspecified: Secondary | ICD-10-CM | POA: Diagnosis not present

## 2022-07-28 DIAGNOSIS — E441 Mild protein-calorie malnutrition: Secondary | ICD-10-CM | POA: Diagnosis not present

## 2022-07-28 DIAGNOSIS — M25561 Pain in right knee: Secondary | ICD-10-CM | POA: Diagnosis not present

## 2022-07-28 DIAGNOSIS — M797 Fibromyalgia: Secondary | ICD-10-CM | POA: Diagnosis not present

## 2022-07-28 DIAGNOSIS — Z87891 Personal history of nicotine dependence: Secondary | ICD-10-CM | POA: Diagnosis not present

## 2022-07-28 DIAGNOSIS — M48061 Spinal stenosis, lumbar region without neurogenic claudication: Secondary | ICD-10-CM | POA: Diagnosis not present

## 2022-08-04 DIAGNOSIS — M4186 Other forms of scoliosis, lumbar region: Secondary | ICD-10-CM | POA: Diagnosis not present

## 2022-08-04 DIAGNOSIS — M25512 Pain in left shoulder: Secondary | ICD-10-CM | POA: Diagnosis not present

## 2022-08-04 DIAGNOSIS — F1120 Opioid dependence, uncomplicated: Secondary | ICD-10-CM | POA: Diagnosis not present

## 2022-08-04 DIAGNOSIS — I2510 Atherosclerotic heart disease of native coronary artery without angina pectoris: Secondary | ICD-10-CM | POA: Diagnosis not present

## 2022-08-04 DIAGNOSIS — E441 Mild protein-calorie malnutrition: Secondary | ICD-10-CM | POA: Diagnosis not present

## 2022-08-04 DIAGNOSIS — M47817 Spondylosis without myelopathy or radiculopathy, lumbosacral region: Secondary | ICD-10-CM | POA: Diagnosis not present

## 2022-08-04 DIAGNOSIS — D509 Iron deficiency anemia, unspecified: Secondary | ICD-10-CM | POA: Diagnosis not present

## 2022-08-04 DIAGNOSIS — M48061 Spinal stenosis, lumbar region without neurogenic claudication: Secondary | ICD-10-CM | POA: Diagnosis not present

## 2022-08-04 DIAGNOSIS — I700 Atherosclerosis of aorta: Secondary | ICD-10-CM | POA: Diagnosis not present

## 2022-08-04 DIAGNOSIS — M25561 Pain in right knee: Secondary | ICD-10-CM | POA: Diagnosis not present

## 2022-08-04 DIAGNOSIS — M25562 Pain in left knee: Secondary | ICD-10-CM | POA: Diagnosis not present

## 2022-08-04 DIAGNOSIS — F419 Anxiety disorder, unspecified: Secondary | ICD-10-CM | POA: Diagnosis not present

## 2022-08-04 DIAGNOSIS — G894 Chronic pain syndrome: Secondary | ICD-10-CM | POA: Diagnosis not present

## 2022-08-04 DIAGNOSIS — M25511 Pain in right shoulder: Secondary | ICD-10-CM | POA: Diagnosis not present

## 2022-08-04 DIAGNOSIS — M797 Fibromyalgia: Secondary | ICD-10-CM | POA: Diagnosis not present

## 2022-08-04 DIAGNOSIS — F32A Depression, unspecified: Secondary | ICD-10-CM | POA: Diagnosis not present

## 2022-08-04 DIAGNOSIS — Z87891 Personal history of nicotine dependence: Secondary | ICD-10-CM | POA: Diagnosis not present

## 2022-08-05 NOTE — Telephone Encounter (Signed)
Patient called in stating that a worker from [facility] came to see her at her home  and advised her to call Dr Alphonsus Sias to let him know to send papers to Dr Laban Emperor so that she can go ahead and start her procedures.

## 2022-08-05 NOTE — Telephone Encounter (Signed)
Left message on VM for pt that I spoke to [facility] and they d/c her yesterday for reaching all of her goals. They said she will need a referral from [place] sent to that Dr.

## 2022-08-09 NOTE — Telephone Encounter (Signed)
Called and spoke to pt. Advised her that Dr Alphonsus Sias has sent a message to Dr Shireen Quan.  She was also asking if we would be able to get her seen by a dermatologist any quicker. I advised her that a referral would not help. But, I suggested she call and check for cancellations.

## 2022-08-09 NOTE — Telephone Encounter (Signed)
Pt called requesting call back from [facility] regarding the call they had on 08/05/22. Call back # [phone]

## 2022-08-17 ENCOUNTER — Encounter: Payer: Self-pay | Admitting: Family Medicine

## 2022-08-17 ENCOUNTER — Ambulatory Visit (INDEPENDENT_AMBULATORY_CARE_PROVIDER_SITE_OTHER): Payer: Medicare HMO | Admitting: Family Medicine

## 2022-08-17 VITALS — BP 118/74 | HR 61 | Temp 98.2°F | Ht 63.0 in | Wt 97.4 lb

## 2022-08-17 DIAGNOSIS — L299 Pruritus, unspecified: Secondary | ICD-10-CM | POA: Diagnosis not present

## 2022-08-17 DIAGNOSIS — R21 Rash and other nonspecific skin eruption: Secondary | ICD-10-CM

## 2022-08-17 MED ORDER — TRIAMCINOLONE ACETONIDE 0.1 % EX CREA: 1.0000 | TOPICAL_CREAM | Freq: Two times a day (BID) | CUTANEOUS | 0 refills | Status: DC

## 2022-08-17 NOTE — Patient Instructions (Signed)
Nice to see you. We will contact you with your lab results. Please try the topical triamcinolone to see if that is beneficial. You can also take Claritin over-the-counter for this. If this is not improving in the next several days to a week please let us know.

## 2022-08-17 NOTE — Assessment & Plan Note (Addendum)
Patient with rash bilaterally on her chest.  Undetermined cause.  We will trial triamcinolone 0.1% cream to see if that is beneficial.  She can also trial Claritin 10 mg daily over-the-counter as needed for itching.  If not improving with this she will let us know.  Given the degree of her itching we will check hepatic function panel to rule out underlying hepatic dysfunction.

## 2022-08-17 NOTE — Progress Notes (Signed)
Marikay Alar, MD Phone: [phone]  Lindsey Ward is a 72 y.o. female who presents today for same-day visit.  Rash: Patient notes onset of rash about 3 weeks ago.  Notes significant itching with this.  Notes its over her upper chest and between her breasts.  Also around towards her armpits.  She has a spot on each lower leg that is itching as well though has no rash.  She notes no fevers.  She does not feel ill.  She notes no new medications, soaps, detergents, or exposures.  No contacts with similar symptoms.  No recent tick bites.  Social History   Tobacco Use  Smoking Status Former   Packs/day: 1.00   Years: 40.00   Additional pack years: 0.00   Total pack years: 40.00   Types: Cigarettes   Quit date: 09/25/2013   Years since quitting: 8.8   Passive exposure: Never  Smokeless Tobacco Never  Tobacco Comments        Current Outpatient Medications on File Prior to Visit  Medication Sig Dispense Refill   ALPRAZolam (XANAX) 1 MG tablet Take 1 tablet (1 mg total) by mouth 3 (three) times daily as needed. 90 tablet 0   diphenhydrAMINE (BENADRYL) 25 mg capsule Take 25 mg by mouth every 6 (six) hours as needed.     HYDROcodone-acetaminophen (NORCO/VICODIN) 5-325 MG tablet Take 1 tablet by mouth 3 (three) times daily as needed. Dx Code M79.7 90 tablet 0   hydrocortisone 2.5 % cream Apply topically 3 (three) times daily as needed. 28 g 3   Menthol, Topical Analgesic, (BIOFREEZE EX) Apply 1 application  topically as needed.     omeprazole (PRILOSEC) 20 MG capsule TAKE 1 CAPSULE BY MOUTH EVERY DAY 90 capsule 3   Polyethyl Glycol-Propyl Glycol (SYSTANE OP) Apply to eye as needed.     promethazine (PHENERGAN) 12.5 MG tablet Take 1-2 tablets (12.5-25 mg total) by mouth 3 (three) times daily as needed for nausea or vomiting. 60 tablet 0   SUMAtriptan (IMITREX) 50 MG tablet TAKE 0.5-1 TABLET BY MOUTH DAILY AS NEEDED FOR MIGRAINE. 9 tablet 11   No current facility-administered medications  on file prior to visit.     ROS see history of present illness  Objective  Physical Exam Vitals:   08/17/22 1523  BP: 118/74  Pulse: 61  Temp: 98.2 F (36.8 C)  SpO2: 99%    BP Readings from Last 3 Encounters:  08/17/22 118/74  06/15/22 (!) 129/43  06/10/22 138/70   Wt Readings from Last 3 Encounters:  08/17/22 97 lb 6.4 oz (44.2 kg)  06/15/22 98 lb (44.5 kg)  06/10/22 100 lb 9.6 oz (45.6 kg)    Physical Exam Skin:    Comments: Papular erythematous excoriated rash over her upper chest, areas of the outside of her lower legs with no rash      Assessment/Plan: Please see individual problem list.  Rash Assessment & Plan: Patient with rash bilaterally on her chest.  Undetermined cause.  We will trial triamcinolone 0.1% cream to see if that is beneficial.  She can also trial Claritin 10 mg daily over-the-counter as needed for itching.  If not improving with this she will let us know.  Given the degree of her itching we will check hepatic function panel to rule out underlying hepatic dysfunction.  Orders: -     Triamcinolone Acetonide; Apply 1 Application topically 2 (two) times daily.  Dispense: 30 g; Refill: 0  Itching -  Hepatic function panel     Return if symptoms worsen or fail to improve.   Marikay Alar, MD [facility]

## 2022-08-18 DIAGNOSIS — L570 Actinic keratosis: Secondary | ICD-10-CM | POA: Diagnosis not present

## 2022-08-19 NOTE — Telephone Encounter (Signed)
Pt called in to know the status wants to know if she needs a referral to see Dr Shireen Quan  . Would like a call back #[phone]

## 2022-08-19 NOTE — Telephone Encounter (Signed)
Spoke to pt. She will give them a call. 

## 2022-08-22 ENCOUNTER — Telehealth: Payer: Self-pay | Admitting: Family Medicine

## 2022-08-22 NOTE — Telephone Encounter (Signed)
I saw this patient for itching last week and ordered some blood work on her. It looks like this was not drawn while she was here. Can you call her and see if her itching has improved or if it is still going on? If she is still having itching she should come in to have the labs drawn. Thanks.

## 2022-08-24 DIAGNOSIS — H16143 Punctate keratitis, bilateral: Secondary | ICD-10-CM | POA: Diagnosis not present

## 2022-08-24 DIAGNOSIS — H02052 Trichiasis without entropian right lower eyelid: Secondary | ICD-10-CM | POA: Diagnosis not present

## 2022-08-24 DIAGNOSIS — H02055 Trichiasis without entropian left lower eyelid: Secondary | ICD-10-CM | POA: Diagnosis not present

## 2022-08-24 DIAGNOSIS — H0288A Meibomian gland dysfunction right eye, upper and lower eyelids: Secondary | ICD-10-CM | POA: Diagnosis not present

## 2022-08-24 NOTE — Telephone Encounter (Signed)
Called and spoke with pt and she stated that she went to the Dermatologist because she tore her skin up, they gave her a cream and so far it has subsided, she states she will pass on getting the labs but that she goes to see her Doctor next week for a check up and she can get labs with him.

## 2022-08-26 ENCOUNTER — Encounter: Payer: Self-pay | Admitting: Internal Medicine

## 2022-08-26 ENCOUNTER — Ambulatory Visit (INDEPENDENT_AMBULATORY_CARE_PROVIDER_SITE_OTHER): Payer: Medicare HMO | Admitting: Internal Medicine

## 2022-08-26 VITALS — BP 130/80 | HR 64 | Temp 97.5°F | Ht 63.0 in | Wt 96.0 lb

## 2022-08-26 DIAGNOSIS — F1120 Opioid dependence, uncomplicated: Secondary | ICD-10-CM

## 2022-08-26 DIAGNOSIS — G8929 Other chronic pain: Secondary | ICD-10-CM

## 2022-08-26 DIAGNOSIS — F1320 Sedative, hypnotic or anxiolytic dependence, uncomplicated: Secondary | ICD-10-CM | POA: Diagnosis not present

## 2022-08-26 DIAGNOSIS — F39 Unspecified mood [affective] disorder: Secondary | ICD-10-CM

## 2022-08-26 MED ORDER — ALPRAZOLAM 1 MG PO TABS: 1.0000 mg | ORAL_TABLET | Freq: Three times a day (TID) | ORAL | 0 refills | Status: DC | PRN

## 2022-08-26 MED ORDER — HYDROCODONE-ACETAMINOPHEN 5-325 MG PO TABS: 1.0000 | ORAL_TABLET | Freq: Three times a day (TID) | ORAL | 0 refills | Status: DC | PRN

## 2022-08-26 NOTE — Assessment & Plan Note (Signed)
PDMP reviewed No concerns 

## 2022-08-26 NOTE — Assessment & Plan Note (Signed)
Using less than allowed xanax dose (still high though)

## 2022-08-26 NOTE — Progress Notes (Signed)
Subjective:    Patient ID: Lindsey Ward, female    DOB: [birth year], 72 y.o.   MRN: [id number]  HPI Here for follow up of ongoing pain/disability from fibromyalgia and other pain  Did have home therapy Was discharged--now hoping that she will qualify for intervention now  Uses the hydrocodone 2-3 times a day  Gets anxious--uses xanax at bedtime Also uses it for anxiety spells--like palpitaitons  Still spending a lot of time in bed Some better days--she will get out and do her grocery shopping Then exhausted and in pain by the time she is done  Current Outpatient Medications on File Prior to Visit  Medication Sig Dispense Refill   ALPRAZolam (XANAX) 1 MG tablet Take 1 tablet (1 mg total) by mouth 3 (three) times daily as needed. 90 tablet 0   diphenhydrAMINE (BENADRYL) 25 mg capsule Take 25 mg by mouth every 6 (six) hours as needed.     HYDROcodone-acetaminophen (NORCO/VICODIN) 5-325 MG tablet Take 1 tablet by mouth 3 (three) times daily as needed. Dx Code M79.7 90 tablet 0   hydrocortisone 2.5 % cream Apply topically 3 (three) times daily as needed. 28 g 3   Menthol, Topical Analgesic, (BIOFREEZE EX) Apply 1 application  topically as needed.     omeprazole (PRILOSEC) 20 MG capsule TAKE 1 CAPSULE BY MOUTH EVERY DAY 90 capsule 3   Polyethyl Glycol-Propyl Glycol (SYSTANE OP) Apply to eye as needed.     promethazine (PHENERGAN) 12.5 MG tablet Take 1-2 tablets (12.5-25 mg total) by mouth 3 (three) times daily as needed for nausea or vomiting. 60 tablet 0   SUMAtriptan (IMITREX) 50 MG tablet TAKE 0.5-1 TABLET BY MOUTH DAILY AS NEEDED FOR MIGRAINE. 9 tablet 11   triamcinolone cream (KENALOG) 0.1 % Apply 1 Application topically 2 (two) times daily. 30 g 0   No current facility-administered medications on file prior to visit.    Allergies  Allergen Reactions   Tizanidine Other (See Comments)    "throws me in a different world"   Aspirin     REACTION: GI bleed   Buspirone Hcl      REACTION: unspecified   Ciprofloxacin     REACTION: unspecified   Citalopram Other (See Comments)    Ear ringing.    Codeine    Diazepam    Duloxetine     REACTION: sz like activity   Ibuprofen     REACTION: GI bleed   Imipramine Hcl Other (See Comments)    Ear ringing.    Oxycodone-Acetaminophen     REACTION: unspecified   Pregabalin     REACTION: kept her up and confusion   Propoxyphene Hcl    Risperidone     REACTION: confusion   Savella [Milnacipran] Other (See Comments)    Body wouldn't move, SOB, etc   Sulfonamide Derivatives     REACTION: unspecified   Tramadol Hcl     REACTION: doesn't remember what happened but couldn't tolerate   Tramadol Hcl Other (See Comments)    REACTION: doesn't remember what happened but couldn't tolerate REACTION: doesn't remember what happened but couldn't tolerate REACTION: doesn't remember what happened but couldn't tolerate    Venlafaxine     REACTION: unspecified   Methocarbamol Rash   Savella [Milnacipran Hcl] Palpitations and Other (See Comments)    Dizziness    Past Medical History:  Diagnosis Date   Bowel obstruction (HCC)    Chronic fatigue    Depression    Fibromyalgia  GERD (gastroesophageal reflux disease)    Hyperlipemia    Migraine    Obstipation    Osteopenia    PUD (peptic ulcer disease)     Past Surgical History:  Procedure Laterality Date   ABDOMINAL SURGERY     APPENDECTOMY  2005   BREAST BIOPSY Bilateral 1999   rt w/out clip - neg, lt w/clip - neg   BREAST CYST ASPIRATION Right 2003   neg   CHOLECYSTECTOMY  2004   COLONOSCOPY WITH PROPOFOL N/A 06/09/2016   Procedure: COLONOSCOPY WITH PROPOFOL;  Surgeon: Wyline Mood, MD;  Location: [facility];  Service: Endoscopy;  Laterality: N/A;   ESOPHAGOGASTRODUODENOSCOPY (EGD) WITH PROPOFOL N/A 06/09/2016   Procedure: ESOPHAGOGASTRODUODENOSCOPY (EGD) WITH PROPOFOL;  Surgeon: Wyline Mood, MD;  Location: [facility];  Service: Endoscopy;  Laterality: N/A;    EYE SURGERY     GIVENS CAPSULE STUDY N/A 06/23/2016   Procedure: GIVENS CAPSULE STUDY;  Surgeon: Wyline Mood, MD;  Location: [facility];  Service: Endoscopy;  Laterality: N/A;   PARS PLANA VITRECTOMY W/ REPAIR OF MACULAR HOLE Left 01/2017   [facility]   TOTAL ABDOMINAL HYSTERECTOMY W/ BILATERAL SALPINGOOPHORECTOMY  1986    Family History  Problem Relation Age of Onset   Heart disease Mother        AMI   Stroke Mother    Heart disease Father    Hypertension Sister    Breast cancer Sister    Hypertension Sister    Heart disease Sister        MI   Diabetes Sister    Arthritis/Rheumatoid Sister    COPD Sister    Kidney cancer Neg Hx    Kidney disease Neg Hx    Prostate cancer Neg Hx     Social History   Socioeconomic History   Marital status: Single    Spouse name: Not on file   Number of children: 1   Years of education: Not on file   Highest education level: Not on file  Occupational History   Occupation: DISABLED  Tobacco Use   Smoking status: Former    Packs/day: 1.00    Years: 40.00    Additional pack years: 0.00    Total pack years: 40.00    Types: Cigarettes    Quit date: 09/25/2013    Years since quitting: 8.9    Passive exposure: Never   Smokeless tobacco: Never   Tobacco comments:       Vaping Use   Vaping Use: Never used  Substance and Sexual Activity   Alcohol use: Not Currently    Alcohol/week: 0.0 standard drinks of alcohol    Comment: rarely   Drug use: No   Sexual activity: Not on file  Other Topics Concern   Not on file  Social History Narrative   Living alone again      No living will   Daughter should make health care decisions   Requests DNR---done 10/24/14   No tube feeds if cognitively unaware   Highest level of education: 9th   Social Determinants of Health   Financial Resource Strain: Not on file  Food Insecurity: Not on file  Transportation Needs: Not on file  Physical Activity: Not on file  Stress: Not on file  Social  Connections: Not on file  Intimate Partner Violence: Not on file   Review of Systems Gets DOE No chest pain    Objective:   Physical Exam Constitutional:      Appearance: Normal appearance.  Neurological:  Mental Status: She is alert.  Psychiatric:        Mood and Affect: Mood normal.        Behavior: Behavior normal.            Assessment & Plan:

## 2022-08-26 NOTE — Assessment & Plan Note (Signed)
Reactive dysthymia and severe anxiety Has failed multiple meds Uses just the xanax 1mg  up to tid

## 2022-08-26 NOTE — Assessment & Plan Note (Signed)
Has fibromyalgia plus cervical and lumbar spine disease Had therapy--not much help Hopefully can proceed with planned interventions with Dr Laban Emperor now

## 2022-09-07 DIAGNOSIS — H35373 Puckering of macula, bilateral: Secondary | ICD-10-CM | POA: Diagnosis not present

## 2022-09-07 DIAGNOSIS — H524 Presbyopia: Secondary | ICD-10-CM | POA: Diagnosis not present

## 2022-09-23 ENCOUNTER — Other Ambulatory Visit: Payer: Self-pay | Admitting: Internal Medicine

## 2022-09-23 DIAGNOSIS — Z1231 Encounter for screening mammogram for malignant neoplasm of breast: Secondary | ICD-10-CM

## 2022-09-27 ENCOUNTER — Ambulatory Visit: Payer: Medicare HMO | Attending: Pain Medicine | Admitting: Pain Medicine

## 2022-09-27 DIAGNOSIS — Z91199 Patient's noncompliance with other medical treatment and regimen due to unspecified reason: Secondary | ICD-10-CM

## 2022-09-27 NOTE — Progress Notes (Signed)
(  09/27/2022) NO-SHOW to scheduled procedure.

## 2022-09-27 NOTE — Patient Instructions (Signed)

## 2022-10-05 ENCOUNTER — Other Ambulatory Visit: Payer: Self-pay | Admitting: Internal Medicine

## 2022-10-05 NOTE — Telephone Encounter (Signed)
Pt requesting refill of Hydrocodone. Has appointment for injections next week and doesn't have enough medicine to last.

## 2022-10-05 NOTE — Telephone Encounter (Signed)
Last filed 08-26-22 #90 Last OV 08-26-22 Next OV 11-28-22 [facility]

## 2022-10-06 MED ORDER — HYDROCODONE-ACETAMINOPHEN 5-325 MG PO TABS: 1.0000 | ORAL_TABLET | Freq: Three times a day (TID) | ORAL | 0 refills | Status: DC | PRN

## 2022-10-06 NOTE — Telephone Encounter (Signed)
Spoke to pt

## 2022-10-06 NOTE — Telephone Encounter (Signed)
[facility] called in and stated that [facility] HYDROcodone-acetaminophen (NORCO/VICODIN) 5-325 MG tablet . She would like for this prescription to be sent to over [id number] - [place], [place] - [address] Patient would like a call when this is put in. Thank you!

## 2022-10-06 NOTE — Telephone Encounter (Signed)
Let her know I sent it to the [facility]

## 2022-10-06 NOTE — Addendum Note (Signed)
Addended by: Tillman Abide I on: 10/06/2022 10:04 AM   Modules accepted: Orders

## 2022-10-10 NOTE — Patient Instructions (Signed)

## 2022-10-10 NOTE — Progress Notes (Unsigned)
PROVIDER NOTE: Interpretation of information contained herein should be left to medically-trained personnel. Specific patient instructions are provided elsewhere under "Patient Instructions" section of medical record. This document was created in part using STT-dictation technology, any transcriptional errors that may result from this process are unintentional.  Patient: Lindsey Ward Type: Established DOB: 1950/10/16 MRN: [id number] PCP: Karie Schwalbe, MD  Service: Procedure DOS: 10/11/2022 Setting: Ambulatory Location: Ambulatory outpatient facility Delivery: Face-to-face Provider: Oswaldo Done, MD Specialty: Interventional Pain Management Specialty designation: 09 Location: Outpatient facility Ref. Prov.: Karie Schwalbe, MD       Interventional Therapy   Procedure: Lumbar epidural steroid injection (LESI) (interlaminar) #1    Laterality: Right   Level:  L2-3 Level.  Imaging: Fluoroscopic guidance Spinal ([id number]) Anesthesia: Local anesthesia (1-2% Lidocaine) Anxiolysis: IV Versed 1.5 mg Sedation: Moderate Sedation                       DOS: 10/11/2022  Performed by: Oswaldo Done, MD  Purpose: Diagnostic/Therapeutic Indications: Lumbar radicular pain of intraspinal etiology of more than 4 weeks that has failed to respond to conservative therapy and is severe enough to impact quality of life or function. 1. Chronic low back pain (1ry area of Pain) (Bilateral) (R>L) w/o sciatica   2. Chronic lower extremity pain (Bilateral)   3. DDD (degenerative disc disease), lumbosacral   4. Grade 1 Retrolisthesis of L2/L3 (3mm), L3-L4, L4/L5   5. Lumbar lateral recess stenosis (Right: L2-3) (Left: L4-5)   6. Abnormal MRI, lumbar spine (08/11/2020)    NAS-11 Pain score:   Pre-procedure: 9 /10   Post-procedure: 0-No pain/10      Position / Prep / Materials:  Position: Prone w/ head of the table raised (slight reverse trendelenburg) to facilitate breathing.  Prep  solution: DuraPrep (Iodine Povacrylex [0.7% available iodine] and Isopropyl Alcohol, 74% w/w) Prep Area: Entire Posterior Lumbar Region from lower scapular tip down to mid buttocks area and from flank to flank. Materials:  Tray: Epidural tray Needle(s):  Type: Epidural needle (Tuohy) Gauge (G):  17 Length: Regular (3.5-in) Qty: 1   H&P (Pre-op Assessment):  [name] is a 72 y.o. (year old), female patient, seen today for interventional treatment. She  has a past surgical history that includes Appendectomy (2005); Cholecystectomy (2004); Total abdominal hysterectomy w/ bilateral salpingoophorectomy (1986); Abdominal surgery; Esophagogastroduodenoscopy (egd) with propofol (N/A, 06/09/2016); Colonoscopy with propofol (N/A, 06/09/2016); Givens capsule study (N/A, 06/23/2016); Pars plana vitrectomy w/ repair of macular hole (Left, 01/2017); Eye surgery; Breast biopsy (Bilateral, 1999); and Breast cyst aspiration (Right, 2003). Ms. Wible has a current medication list which includes the following prescription(s): alprazolam, diphenhydramine, hydrocodone-acetaminophen, menthol (topical analgesic), omeprazole, polyethyl glycol-propyl glycol, promethazine, sumatriptan, triamcinolone cream, and hydrocortisone, and the following Facility-Administered Medications: fentanyl and lactated ringers. Her primarily concern today is the Back Pain (lower)  Initial Vital Signs:  Pulse/HCG Rate: 63ECG Heart Rate: 60 (NSR) Temp: (!) 97.2 F (36.2 C) Resp: 14 BP: 134/60 SpO2: 100 %  BMI: Estimated body mass index is 17.01 kg/m as calculated from the following:   Height as of this encounter: 5\' 3"  (1.6 m).   Weight as of this encounter: 96 lb (43.5 kg).  Risk Assessment: Allergies: Reviewed. She is allergic to aspirin, hydralazine, ibuprofen, tizanidine, buspirone hcl, ciprofloxacin, citalopram, codeine, diazepam, duloxetine, imipramine hcl, oxycodone-acetaminophen, propoxyphene hcl, sulfonamide derivatives, tramadol  hcl, venlafaxine, methocarbamol, pregabalin, risperidone, and savella [milnacipran hcl].  Allergy Precautions: None required Coagulopathies: Reviewed. None identified.  Blood-thinner therapy: None at this time Active Infection(s): Reviewed. None identified. Ms. Beber is afebrile  Site Confirmation: Ms. Cott was asked to confirm the procedure and laterality before marking the site Procedure checklist: Completed Consent: Before the procedure and under the influence of no sedative(s), amnesic(s), or anxiolytics, the patient was informed of the treatment options, risks and possible complications. To fulfill our ethical and legal obligations, as recommended by the American Medical Association's Code of Ethics, I have informed the patient of my clinical impression; the nature and purpose of the treatment or procedure; the risks, benefits, and possible complications of the intervention; the alternatives, including doing nothing; the risk(s) and benefit(s) of the alternative treatment(s) or procedure(s); and the risk(s) and benefit(s) of doing nothing. The patient was provided information about the general risks and possible complications associated with the procedure. These may include, but are not limited to: failure to achieve desired goals, infection, bleeding, organ or nerve damage, allergic reactions, paralysis, and death. In addition, the patient was informed of those risks and complications associated to Spine-related procedures, such as failure to decrease pain; infection (i.e.: Meningitis, epidural or intraspinal abscess); bleeding (i.e.: epidural hematoma, subarachnoid hemorrhage, or any other type of intraspinal or peri-dural bleeding); organ or nerve damage (i.e.: Any type of peripheral nerve, nerve root, or spinal cord injury) with subsequent damage to sensory, motor, and/or autonomic systems, resulting in permanent pain, numbness, and/or weakness of one or several areas of the body; allergic  reactions; (i.e.: anaphylactic reaction); and/or death. Furthermore, the patient was informed of those risks and complications associated with the medications. These include, but are not limited to: allergic reactions (i.e.: anaphylactic or anaphylactoid reaction(s)); adrenal axis suppression; blood sugar elevation that in diabetics may result in ketoacidosis or comma; water retention that in patients with history of congestive heart failure may result in shortness of breath, pulmonary edema, and decompensation with resultant heart failure; weight gain; swelling or edema; medication-induced neural toxicity; particulate matter embolism and blood vessel occlusion with resultant organ, and/or nervous system infarction; and/or aseptic necrosis of one or more joints. Finally, the patient was informed that Medicine is not an exact science; therefore, there is also the possibility of unforeseen or unpredictable risks and/or possible complications that may result in a catastrophic outcome. The patient indicated having understood very clearly. We have given the patient no guarantees and we have made no promises. Enough time was given to the patient to ask questions, all of which were answered to the patient's satisfaction. Ms. Tieszen has indicated that she wanted to continue with the procedure. Attestation: I, the ordering provider, attest that I have discussed with the patient the benefits, risks, side-effects, alternatives, likelihood of achieving goals, and potential problems during recovery for the procedure that I have provided informed consent. Date  Time: 10/11/2022  9:02 AM   Pre-Procedure Preparation:  Monitoring: As per clinic protocol. Respiration, ETCO2, SpO2, BP, heart rate and rhythm monitor placed and checked for adequate function Safety Precautions: Patient was assessed for positional comfort and pressure points before starting the procedure. Time-out: I initiated and conducted the "Time-out" before  starting the procedure, as per protocol. The patient was asked to participate by confirming the accuracy of the "Time Out" information. Verification of the correct person, site, and procedure were performed and confirmed by me, the nursing staff, and the patient. "Time-out" conducted as per Joint Commission's Universal Protocol (UP.01.01.01). Time: 0945 Start Time: 0945 hrs.  Description/Narrative of Procedure:  Target: Epidural space via interlaminar opening, initially targeting the lower laminar border of the superior vertebral body. Region: Lumbar Approach: Percutaneous paravertebral  Rationale (medical necessity): procedure needed and proper for the diagnosis and/or treatment of the patient's medical symptoms and needs. Procedural Technique Safety Precautions: Aspiration looking for blood return was conducted prior to all injections. At no point did we inject any substances, as a needle was being advanced. No attempts were made at seeking any paresthesias. Safe injection practices and needle disposal techniques used. Medications properly checked for expiration dates. SDV (single dose vial) medications used. Description of the Procedure: Protocol guidelines were followed. The procedure needle was introduced through the skin, ipsilateral to the reported pain, and advanced to the target area. Bone was contacted and the needle walked caudad, until the lamina was cleared. The epidural space was identified using "loss-of-resistance technique" with 2-3 ml of PF-NaCl (0.9% NSS), in a 5cc LOR glass syringe.  Vitals:   10/11/22 0942 10/11/22 0947 10/11/22 0953 10/11/22 1000  BP: (!) 142/69 (!) 153/60 (!) 149/62 (!) 156/50  Pulse:      Resp: 14 18 15 16   Temp:      SpO2: 100% 100% 100% 98%  Weight:      Height:        Start Time: 0945 hrs. End Time: 0951 hrs.  Imaging Guidance (Spinal):          Type of Imaging Technique: Fluoroscopy Guidance (Spinal) Indication(s): Assistance in needle  guidance and placement for procedures requiring needle placement in or near specific anatomical locations not easily accessible without such assistance. Exposure Time: Please see nurses notes. Contrast: Before injecting any contrast, we confirmed that the patient did not have an allergy to iodine, shellfish, or radiological contrast. Once satisfactory needle placement was completed at the desired level, radiological contrast was injected. Contrast injected under live fluoroscopy. No contrast complications. See chart for type and volume of contrast used. Fluoroscopic Guidance: I was personally present during the use of fluoroscopy. "Tunnel Vision Technique" used to obtain the best possible view of the target area. Parallax error corrected before commencing the procedure. "Direction-depth-direction" technique used to introduce the needle under continuous pulsed fluoroscopy. Once target was reached, antero-posterior, oblique, and lateral fluoroscopic projection used confirm needle placement in all planes. Images permanently stored in EMR. Interpretation: I personally interpreted the imaging intraoperatively. Adequate needle placement confirmed in multiple planes. Appropriate spread of contrast into desired area was observed. No evidence of afferent or efferent intravascular uptake. No intrathecal or subarachnoid spread observed. Permanent images saved into the patient's record.  Antibiotic Prophylaxis:   Anti-infectives (From admission, onward)    None      Indication(s): None identified  Post-operative Assessment:  Post-procedure Vital Signs:  Pulse/HCG Rate: 6362 Temp: (!) 97.2 F (36.2 C) Resp: 16 BP: (!) 156/50 SpO2: 98 %  EBL: None  Complications: No immediate post-treatment complications observed by team, or reported by patient.  Note: The patient tolerated the entire procedure well. A repeat set of vitals were taken after the procedure and the patient was kept under observation  following institutional policy, for this type of procedure. Post-procedural neurological assessment was performed, showing return to baseline, prior to discharge. The patient was provided with post-procedure discharge instructions, including a section on how to identify potential problems. Should any problems arise concerning this procedure, the patient was given instructions to immediately contact us, at any time, without hesitation. In any case, we plan to contact the patient by telephone for  a follow-up status report regarding this interventional procedure.  Comments:  No additional relevant information.  Plan of Care (POC)  Orders:  Orders Placed This Encounter  Procedures   Lumbar Epidural Injection    Scheduling Instructions:     Procedure: Interlaminar LESI L2-3     Laterality: Right     Sedation: Patient's choice     Timeframe: Today    Order Specific Question:   Where will this procedure be performed?    Answer:   [facility]   DG PA[facility] C-ARM 1-60 MIN NO REPORT    Intraoperative interpretation by procedural physician at [facility].    Standing Status:   Standing    Number of Occurrences:   1    Order Specific Question:   Reason for exam:    Answer:   Assistance in needle guidance and placement for procedures requiring needle placement in or near specific anatomical locations not easily accessible without such assistance.   Informed Consent Details: Physician/Practitioner Attestation; Transcribe to consent form and obtain patient signature    Note: Always confirm laterality of pain with Ms. Diddle, before procedure. Transcribe to consent form and obtain patient signature.    Order Specific Question:   Physician/Practitioner attestation of informed consent for procedure/surgical case    Answer:   I, the physician/practitioner, attest that I have discussed with the patient the benefits, risks, side effects, alternatives, likelihood of achieving goals and  potential problems during recovery for the procedure that I have provided informed consent.    Order Specific Question:   Procedure    Answer:   Lumbar epidural steroid injection under fluoroscopic guidance    Order Specific Question:   Physician/Practitioner performing the procedure    Answer:   Jakobee Brackins A. Laban Emperor, MD    Order Specific Question:   Indication/Reason    Answer:   Low back and/or lower extremity pain secondary to lumbar radiculitis   Provide equipment / supplies at bedside    Procedural tray: Epidural Tray (Disposable  single use) Skin infiltration needle: Regular 1.5-in, 25-G, (x1) Block needle size: Regular standard Catheter: No catheter required    Standing Status:   Standing    Number of Occurrences:   1    Order Specific Question:   Specify    Answer:   Epidural Tray   Chronic Opioid Analgesic:  No chronic opioid analgesics therapy prescribed by our practice. Hydrocodone/APAP 5/325, 1 tab p.o. 3 times daily (# 90) (last filled on 05/12/2022) MME/day: 15 mg/day   Medications ordered for procedure: Meds ordered this encounter  Medications   iohexol (OMNIPAQUE) 180 MG/ML injection 10 mL    Must be Myelogram-compatible. If not available, you may substitute with a water-soluble, non-ionic, hypoallergenic, myelogram-compatible radiological contrast medium.   lidocaine (XYLOCAINE) 2 % (with pres) injection 400 mg   pentafluoroprop-tetrafluoroeth (GEBAUERS) aerosol   lactated ringers infusion   midazolam (VERSED) 5 MG/5ML injection 0.5-2 mg    Make sure Flumazenil is available in the pyxis when using this medication. If oversedation occurs, administer 0.2 mg IV over 15 sec. If after 45 sec no response, administer 0.2 mg again over 1 min; may repeat at 1 min intervals; not to exceed 4 doses (1 mg)   fentaNYL (SUBLIMAZE) injection 25-50 mcg    Make sure Narcan is available in the pyxis when using this medication. In the event of respiratory depression (RR< 8/min): Titrate  NARCAN (naloxone) in increments of 0.1 to 0.2 mg IV at 2-3  minute intervals, until desired degree of reversal.   sodium chloride flush (NS) 0.9 % injection 2 mL   ropivacaine (PF) 2 mg/mL (0.2%) (NAROPIN) injection 2 mL   triamcinolone acetonide (KENALOG-40) injection 40 mg   Medications administered: We administered iohexol, lidocaine, pentafluoroprop-tetrafluoroeth, lactated ringers, midazolam, sodium chloride flush, ropivacaine (PF) 2 mg/mL (0.2%), and triamcinolone acetonide.  See the medical record for exact dosing, route, and time of administration.  Follow-up plan:   Return in about 2 weeks (around 10/25/2022) for Proc-day (T,Th), (Face2F), (PPE).       Interventional Therapies  Risk Factors  Considerations:  ALLERGY: ASA, ibuprofen, codeine, oxycodone, propoxyphene, tramadol, tizanidine, methocarbamol, pregabalin, Savella.   Planned  Pending:   Diagnostic/Therapeutic right L2-3 LESI #1    Under consideration:   Diagnostic/Therapeutic right L2-3 LESI #1    Completed:   None at this time   Completed by other providers:   Therapeutic bilateral L4-5 TFESI x2 (08/21/2020, 09/01/2021) by Merri Ray, DO [facility])  Therapeutic right C4-5 TFESI x1 (03/05/2015) by Merri Ray, DO [facility])    Therapeutic  Palliative (PRN) options:   None established      Recent Visits No visits were found meeting these conditions. Showing recent visits within past 90 days and meeting all other requirements Today's Visits Date Type Provider Dept  10/11/22 Procedure visit Delano Metz, MD [facility]  Showing today's visits and meeting all other requirements Future Appointments Date Type Provider Dept  10/25/22 Appointment Delano Metz, MD [facility]  Showing future appointments within next 90 days and meeting all other requirements  Disposition: Discharge home  Discharge (Date  Time): 10/11/2022; 1002 hrs.   Primary Care Physician: Karie Schwalbe, MD Location: [facility] Note by: Oswaldo Done, MD (TTS technology used. I apologize for any typographical errors that were not detected and corrected.) Date: 10/11/2022; Time: 10:03 AM  Disclaimer:  Medicine is not an Visual merchandiser. The only guarantee in medicine is that nothing is guaranteed. It is important to note that the decision to proceed with this intervention was based on the information collected from the patient. The Data and conclusions were drawn from the patient's questionnaire, the interview, and the physical examination. Because the information was provided in large part by the patient, it cannot be guaranteed that it has not been purposely or unconsciously manipulated. Every effort has been made to obtain as much relevant data as possible for this evaluation. It is important to note that the conclusions that lead to this procedure are derived in large part from the available data. Always take into account that the treatment will also be dependent on availability of resources and existing treatment guidelines, considered by other Pain Management Practitioners as being common knowledge and practice, at the time of the intervention. For Medico-Legal purposes, it is also important to point out that variation in procedural techniques and pharmacological choices are the acceptable norm. The indications, contraindications, technique, and results of the above procedure should only be interpreted and judged by a Board-Certified Interventional Pain Specialist with extensive familiarity and expertise in the same exact procedure and technique.

## 2022-10-11 ENCOUNTER — Ambulatory Visit
Admission: RE | Admit: 2022-10-11 | Discharge: 2022-10-11 | Disposition: A | Discharge status: Home / Self Care | Payer: Medicare HMO | Source: Ambulatory Visit | Attending: Pain Medicine | Admitting: Pain Medicine

## 2022-10-11 ENCOUNTER — Encounter: Payer: Self-pay | Admitting: Pain Medicine

## 2022-10-11 ENCOUNTER — Ambulatory Visit: Payer: Medicare HMO | Attending: Pain Medicine | Admitting: Pain Medicine

## 2022-10-11 VITALS — BP 156/50 | HR 63 | Temp 97.2°F | Resp 16 | Ht 63.0 in | Wt 96.0 lb

## 2022-10-11 DIAGNOSIS — M79605 Pain in left leg: Secondary | ICD-10-CM | POA: Insufficient documentation

## 2022-10-11 DIAGNOSIS — M4310 Spondylolisthesis, site unspecified: Secondary | ICD-10-CM | POA: Diagnosis not present

## 2022-10-11 DIAGNOSIS — M51379 Other intervertebral disc degeneration, lumbosacral region without mention of lumbar back pain or lower extremity pain: Secondary | ICD-10-CM

## 2022-10-11 DIAGNOSIS — G8929 Other chronic pain: Secondary | ICD-10-CM

## 2022-10-11 DIAGNOSIS — M545 Low back pain, unspecified: Secondary | ICD-10-CM | POA: Insufficient documentation

## 2022-10-11 DIAGNOSIS — M5137 Other intervertebral disc degeneration, lumbosacral region: Secondary | ICD-10-CM | POA: Insufficient documentation

## 2022-10-11 DIAGNOSIS — M48061 Spinal stenosis, lumbar region without neurogenic claudication: Secondary | ICD-10-CM

## 2022-10-11 DIAGNOSIS — M79604 Pain in right leg: Secondary | ICD-10-CM | POA: Diagnosis not present

## 2022-10-11 DIAGNOSIS — R937 Abnormal findings on diagnostic imaging of other parts of musculoskeletal system: Secondary | ICD-10-CM | POA: Diagnosis not present

## 2022-10-11 MED ORDER — IOHEXOL 180 MG/ML  SOLN
INTRAMUSCULAR | Status: AC
  Filled 2022-10-11: qty 10

## 2022-10-11 MED ORDER — SODIUM CHLORIDE 0.9% FLUSH
2.0000 mL | Freq: Once | INTRAVENOUS | Status: AC
  Administered 2022-10-11: 2 mL

## 2022-10-11 MED ORDER — SODIUM CHLORIDE (PF) 0.9 % IJ SOLN
INTRAMUSCULAR | Status: AC
  Filled 2022-10-11: qty 10

## 2022-10-11 MED ORDER — TRIAMCINOLONE ACETONIDE 40 MG/ML IJ SUSP
40.0000 mg | Freq: Once | INTRAMUSCULAR | Status: AC
  Administered 2022-10-11: 40 mg

## 2022-10-11 MED ORDER — FENTANYL CITRATE (PF) 100 MCG/2ML IJ SOLN
INTRAMUSCULAR | Status: AC
  Filled 2022-10-11: qty 2

## 2022-10-11 MED ORDER — MIDAZOLAM HCL 5 MG/5ML IJ SOLN
0.5000 mg | Freq: Once | INTRAMUSCULAR | Status: AC
  Administered 2022-10-11: 1.5 mg via INTRAVENOUS

## 2022-10-11 MED ORDER — TRIAMCINOLONE ACETONIDE 40 MG/ML IJ SUSP
INTRAMUSCULAR | Status: AC
  Filled 2022-10-11: qty 1

## 2022-10-11 MED ORDER — LIDOCAINE HCL (PF) 2 % IJ SOLN
INTRAMUSCULAR | Status: AC
  Filled 2022-10-11: qty 10

## 2022-10-11 MED ORDER — LIDOCAINE HCL 2 % IJ SOLN
20.0000 mL | Freq: Once | INTRAMUSCULAR | Status: AC
  Administered 2022-10-11: 100 mg

## 2022-10-11 MED ORDER — MIDAZOLAM HCL 5 MG/5ML IJ SOLN
INTRAMUSCULAR | Status: AC
  Filled 2022-10-11: qty 5

## 2022-10-11 MED ORDER — ROPIVACAINE HCL 2 MG/ML IJ SOLN
INTRAMUSCULAR | Status: AC
  Filled 2022-10-11: qty 20

## 2022-10-11 MED ORDER — LACTATED RINGERS IV SOLN: Freq: Once | INTRAVENOUS | Status: AC

## 2022-10-11 MED ORDER — FENTANYL CITRATE (PF) 100 MCG/2ML IJ SOLN: 25.0000 ug | INTRAMUSCULAR | Status: DC | PRN

## 2022-10-11 MED ORDER — IOHEXOL 180 MG/ML  SOLN
10.0000 mL | Freq: Once | INTRAMUSCULAR | Status: AC
  Administered 2022-10-11: 10 mL via EPIDURAL

## 2022-10-11 MED ORDER — PENTAFLUOROPROP-TETRAFLUOROETH EX AERO
INHALATION_SPRAY | Freq: Once | CUTANEOUS | Status: AC
  Administered 2022-10-11: 30 via TOPICAL
  Filled 2022-10-11: qty 116

## 2022-10-11 MED ORDER — ROPIVACAINE HCL 2 MG/ML IJ SOLN
2.0000 mL | Freq: Once | INTRAMUSCULAR | Status: AC
  Administered 2022-10-11: 2 mL via EPIDURAL

## 2022-10-11 NOTE — Progress Notes (Signed)
Safety precautions to be maintained throughout the outpatient stay will include: orient to surroundings, keep bed in low position, maintain call bell within reach at all times, provide assistance with transfer out of bed and ambulation.  

## 2022-10-12 ENCOUNTER — Telehealth: Payer: Self-pay | Admitting: *Deleted

## 2022-10-12 ENCOUNTER — Telehealth: Payer: Self-pay | Admitting: Internal Medicine

## 2022-10-12 NOTE — Telephone Encounter (Signed)
Spoke to pt. She did see Dr Legrand Como yesterday. Got injections. She is feeling better today.

## 2022-10-12 NOTE — Telephone Encounter (Signed)
Pt called stating she missed a call from [facility] yesterday. Pt states she believed the call was about some inj. Pt requested a call back @ [phone]

## 2022-10-12 NOTE — Telephone Encounter (Signed)
No problems post procedure. 

## 2022-10-15 ENCOUNTER — Emergency Department
Admission: EM | Admit: 2022-10-15 | Discharge: 2022-10-16 | Disposition: A | Discharge status: Home / Self Care | Payer: Medicare HMO | Attending: Emergency Medicine | Admitting: Emergency Medicine

## 2022-10-15 ENCOUNTER — Other Ambulatory Visit: Payer: Self-pay

## 2022-10-15 DIAGNOSIS — W57XXXA Bitten or stung by nonvenomous insect and other nonvenomous arthropods, initial encounter: Secondary | ICD-10-CM | POA: Diagnosis not present

## 2022-10-15 DIAGNOSIS — R001 Bradycardia, unspecified: Secondary | ICD-10-CM | POA: Diagnosis not present

## 2022-10-15 DIAGNOSIS — S40862A Insect bite (nonvenomous) of left upper arm, initial encounter: Secondary | ICD-10-CM | POA: Diagnosis not present

## 2022-10-15 MED ORDER — DIPHENHYDRAMINE HCL 25 MG PO CAPS
25.0000 mg | ORAL_CAPSULE | Freq: Once | ORAL | Status: AC
  Administered 2022-10-16: 25 mg via ORAL
  Filled 2022-10-15: qty 1

## 2022-10-15 NOTE — ED Triage Notes (Signed)
Complaining of a bug bite on the left upper arm that happed about 4 hours ago. 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 Has started itching around the site.

## 2022-10-15 NOTE — ED Provider Notes (Incomplete)
[facility] Provider Note    Event Date/Time   First MD Initiated Contact with Patient 10/15/22 2241     (approximate)   History   Insect Bite   HPI  Lindsey Ward is a 72 y.o. female with PMH of depression, fibromyalgia and osteopenia who presents for evaluation of an insect bite.  Patient states she put on an old duster earlier today and is afraid she got bit by a spider.  Patient reports this would have happened around 4 hours ago.  She says that the bite site is itchy.      Physical Exam   Triage Vital Signs: ED Triage Vitals  Encounter Vitals Group     BP 10/15/22 2224 (!) 158/54     Systolic BP Percentile --      Diastolic BP Percentile --      Pulse Rate 10/15/22 2224 68     Resp 10/15/22 2224 20     Temp 10/15/22 2224 98 F (36.7 C)     Temp Source 10/15/22 2224 Oral     SpO2 10/15/22 2224 97 %     Weight 10/15/22 2228 99 lb (44.9 kg)     Height 10/15/22 2228 5\' 4"  (1.626 m)     Head Circumference --      Peak Flow --      Pain Score 10/15/22 2228 0     Pain Loc --      Pain Education --      Exclude from Growth Chart --     Most recent vital signs: Vitals:   10/15/22 2224 10/15/22 2227  BP: (!) 158/54   Pulse: 68   Resp: 20   Temp: 98 F (36.7 C)   SpO2: 97% 98%    General: Awake, no distress.  CV:  Good peripheral perfusion.  RRR. Resp:  Normal effort.  CTAB. Abd:  No distention.  Other:  2 macules on the lateral left arm, no surrounding erythema, induration or swelling   ED Results / Procedures / Treatments   Labs (all labs ordered are listed, but only abnormal results are displayed) Labs Reviewed - No data to display   EKG  ***    PROCEDURES:  Critical Care performed: No  Procedures   MEDICATIONS ORDERED IN ED: Medications - No data to display   IMPRESSION / MDM / ASSESSMENT AND PLAN / ED COURSE  I reviewed the triage vital signs and the nursing notes.                              72 year old female presents for evaluation of an insect bite.  Patient was hypertensive in triage, NAD on exam.  Differential diagnosis includes, but is not limited to, insect bite, allergic reaction, anaphylactic reaction.  Patient's presentation is most consistent with acute, uncomplicated illness.  Upon exam I was not able to clearly identify a bite site.  There are no signs of infection at this time.  I discussed taking Benadryl and applying hydrocortisone cream to the area.  While I was going over the treatment plan patient stated she felt like her heart was racing and she had some chest heaviness so I got an EKG.       FINAL CLINICAL IMPRESSION(S) / ED DIAGNOSES   Final diagnoses:  None     Rx / DC Orders   ED Discharge Orders     None  Note:  This document was prepared using Dragon voice recognition software and may include unintentional dictation errors.

## 2022-10-15 NOTE — ED Provider Notes (Signed)
[facility] Provider Note    Event Date/Time   First MD Initiated Contact with Patient 10/15/22 2241     (approximate)   History   Insect Bite   HPI  Lindsey FRANQUI is a 72 y.o. female with PMH of depression, fibromyalgia and osteopenia who presents for evaluation of an insect bite.  Patient states she put on an old duster earlier today and is afraid she got bit by a spider.  Patient reports this would have happened around 4 hours ago.  She says that the bite site is itchy.      Physical Exam   Triage Vital Signs: ED Triage Vitals  Encounter Vitals Group     BP 10/15/22 2224 (!) 158/54     Systolic BP Percentile --      Diastolic BP Percentile --      Pulse Rate 10/15/22 2224 68     Resp 10/15/22 2224 20     Temp 10/15/22 2224 98 F (36.7 C)     Temp Source 10/15/22 2224 Oral     SpO2 10/15/22 2224 97 %     Weight 10/15/22 2228 99 lb (44.9 kg)     Height 10/15/22 2228 5\' 4"  (1.626 m)     Head Circumference --      Peak Flow --      Pain Score 10/15/22 2228 0     Pain Loc --      Pain Education --      Exclude from Growth Chart --     Most recent vital signs: Vitals:   10/15/22 2224 10/15/22 2227  BP: (!) 158/54   Pulse: 68   Resp: 20   Temp: 98 F (36.7 C)   SpO2: 97% 98%    General: Awake, no distress.  CV:  Good peripheral perfusion.  RRR. Resp:  Normal effort.  CTAB. Abd:  No distention.  Other:  2 macules on the lateral left arm, no surrounding erythema, induration or swelling   ED Results / Procedures / Treatments   Labs (all labs ordered are listed, but only abnormal results are displayed) Labs Reviewed - No data to display   EKG  EKG shows sinus bradycardia, with VR rate of 58 bpm.  PROCEDURES:  Critical Care performed: No  Procedures   MEDICATIONS ORDERED IN ED: Medications  diphenhydrAMINE (BENADRYL) capsule 25 mg (has no administration in time range)     IMPRESSION / MDM / ASSESSMENT AND PLAN / ED  COURSE  I reviewed the triage vital signs and the nursing notes.                             72 year old female presents for evaluation of an insect bite.  Patient was hypertensive in triage, NAD on exam.  Differential diagnosis includes, but is not limited to, insect bite, allergic reaction, anaphylactic reaction.  Patient's presentation is most consistent with acute, uncomplicated illness.  Upon exam I was not able to clearly identify a bite site.  There are no signs of infection at this time.  I discussed taking Benadryl and applying hydrocortisone cream to the area.  While I was going over the treatment plan, patient stated she felt like her heart was racing and she had some chest heaviness so I got an EKG. EKG shows NSR with sinus bradycardia.  Advised patient to follow-up with her primary care on Monday if she continued to feel symptomatic.  Patient voiced understanding, all questions were answered and she was stable at discharge.       FINAL CLINICAL IMPRESSION(S) / ED DIAGNOSES   Final diagnoses:  None     Rx / DC Orders   ED Discharge Orders     None        Note:  This document was prepared using Dragon voice recognition software and may include unintentional dictation errors.   Cameron Ali, PA-C 10/16/22 0019    Merwyn Katos, MD 10/16/22 [phone]

## 2022-10-16 NOTE — Discharge Instructions (Signed)
You can take a daily allergy medication like Claritin, Allegra or Zyrtec for the next few days if you continue to have itching.  Please pick up a hydrocortisone cream from any pharmacy or grocery store.  This can be applied to the insect bite site twice a day to help with the itching.

## 2022-10-18 DIAGNOSIS — L570 Actinic keratosis: Secondary | ICD-10-CM | POA: Diagnosis not present

## 2022-10-21 ENCOUNTER — Telehealth: Payer: Self-pay

## 2022-10-21 NOTE — Telephone Encounter (Signed)
Transition Care Management Unsuccessful Follow-up Telephone Call  Date of discharge and from where:  10/16/2022 [facility]  Attempts:  1st Attempt  Reason for unsuccessful TCM follow-up call:  Left voice message  Karlena Luebke [facility]  [facility] Population Health Community Resource Care Guide   [email or web address]  ?? [phone]   Website: [email or web address]  [email or web address]

## 2022-10-24 ENCOUNTER — Telehealth: Payer: Self-pay

## 2022-10-24 NOTE — Telephone Encounter (Signed)
Transition Care Management Follow-up Telephone Call Date of discharge and from where: 10/16/2022 [facility] How have you been since you were released from the hospital? Patient stated she is feeling better and the swelling is decreasing. Any questions or concerns? No  Items Reviewed: Did the pt receive and understand the discharge instructions provided? Yes  Medications obtained and verified?  No medication prescribed patient was told to take OTC benadryl. Other?  Patient consented to [facility] referral to [employer]. Any new allergies since your discharge?  Patient stated she is allergic to levocetirizine 5mg    Dietary orders reviewed? Yes Do you have support at home? Yes   Follow up appointments reviewed:  PCP Hospital f/u appt confirmed? Yes  Scheduled to see Tillman Abide, MD on 11/28/2022 @ [facility] [place]. Specialist Hospital f/u appt confirmed? Yes  Scheduled to see Delano Metz, MD on 10/25/2022 @ [facility]. Are transportation arrangements needed? No  If their condition worsens, is the pt aware to call PCP or go to the Emergency Dept.? Yes Was the patient provided with contact information for the PCP's office or ED? Yes Was to pt encouraged to call back with questions or concerns? Yes  Findley Vi [facility]  [facility] Population Health Community Resource Care Guide   [email or web address]  ?? [phone]   Website: [email or web address]  [email or web address]

## 2022-10-24 NOTE — Progress Notes (Unsigned)
PROVIDER NOTE: Information contained herein reflects review and annotations entered in association with encounter. Interpretation of such information and data should be left to medically-trained personnel. Information provided to patient can be located elsewhere in the medical record under "Patient Instructions". Document created using STT-dictation technology, any transcriptional errors that may result from process are unintentional.    Patient: Lindsey Ward  Service Category: E/M  Provider: Oswaldo Done, MD  DOB: 1950-08-20  DOS: 10/25/2022  Referring Provider: Karie Schwalbe, MD  MRN: [id number]  Specialty: Interventional Pain Management  PCP: Karie Schwalbe, MD  Type: Established Patient  Setting: Ambulatory outpatient    Location: Office  Delivery: Face-to-face     HPI  Ms. Lindsey Ward, a 72 y.o. year old female, is here today because of her No primary diagnosis found.. Ms. Badman primary complain today is No chief complaint on file.  Pertinent problems: Ms. Niebrugge has Atypical migraine; Fibromyalgia; Chronic neck pain (2ry area of Pain) (posterior) (Bilateral) (R>L); Chronic low back pain (1ry area of Pain) (Bilateral) (R>L) w/o sciatica; Chronic hip pain and (3ry area of Pain) (Bilateral) (R>L); Occipital headache (Bilateral); Chronic feet pain (5th area of Pain) (Bilateral) (R>L); Trochanteric bursitis of hip (Left); Chronic pain syndrome; DDD (degenerative disc disease), cervical; DDD (degenerative disc disease), lumbosacral; Grade 1 Retrolisthesis of L2/L3 (3mm), L3-L4, L4/L5; Lumbar lateral recess stenosis (Right: L2-3) (Left: L4-5); Lumbosacral facet arthropathy (L5-S1); Osteoarthritis of facet joint of lumbar spine; Osteoarthritis of facet joint of cervical spine; Chronic knee pain (4th area of Pain) (Bilateral) (L>R); Chronic shoulder pain (6th area of Pain) (Bilateral) (R>L); Cervicogenic headache (7th area of Pain) (Bilateral); Greater occipital neuralgia (Bilateral);  Cervico-occipital neuralgia (Bilateral); Chronic lower extremity pain (Bilateral); Levoscoliosis of lumbar spine; Abnormal MRI, lumbar spine (08/11/2020); and Chronic generalized pain disorder on their pertinent problem list. Pain Assessment: Severity of   is reported as a  /10. Location:    / . Onset:  . Quality:  . Timing:  . Modifying factor(s):  Marland Kitchen Vitals:  vitals were not taken for this visit.  BMI: Estimated body mass index is 16.99 kg/m as calculated from the following:   Height as of 10/15/22: 5\' 4"  (1.626 m).   Weight as of 10/15/22: 99 lb (44.9 kg). Last encounter: 06/15/2022. Last procedure: 10/11/2022.  Reason for encounter: post-procedure evaluation and assessment. ***  Post-procedure evaluation   Procedure: Lumbar epidural steroid injection (LESI) (interlaminar) #1    Laterality: Right   Level:  L2-3 Level.  Imaging: Fluoroscopic guidance Spinal ([id number]) Anesthesia: Local anesthesia (1-2% Lidocaine) Anxiolysis: IV Versed 1.5 mg Sedation: Moderate Sedation                       DOS: 10/11/2022  Performed by: Oswaldo Done, MD  Purpose: Diagnostic/Therapeutic Indications: Lumbar radicular pain of intraspinal etiology of more than 4 weeks that has failed to respond to conservative therapy and is severe enough to impact quality of life or function. 1. Chronic low back pain (1ry area of Pain) (Bilateral) (R>L) w/o sciatica   2. Chronic lower extremity pain (Bilateral)   3. DDD (degenerative disc disease), lumbosacral   4. Grade 1 Retrolisthesis of L2/L3 (3mm), L3-L4, L4/L5   5. Lumbar lateral recess stenosis (Right: L2-3) (Left: L4-5)   6. Abnormal MRI, lumbar spine (08/11/2020)    NAS-11 Pain score:   Pre-procedure: 9 /10   Post-procedure: 0-No pain/10      Effectiveness:  Initial hour after procedure:   ***.  Subsequent 4-6 hours post-procedure:   ***. Analgesia past initial 6 hours:   ***. Ongoing improvement:  Analgesic:  *** Function:    ***    ROM:    ***      Pharmacotherapy Assessment  Analgesic: No chronic opioid analgesics therapy prescribed by our practice. Hydrocodone/APAP 5/325, 1 tab p.o. 3 times daily (# 90) (last filled on 05/12/2022) MME/day: 15 mg/day   Monitoring: [place] PMP: PDMP reviewed during this encounter.       Pharmacotherapy: No side-effects or adverse reactions reported. Compliance: No problems identified. Effectiveness: Clinically acceptable.  No notes on file  No results found for: "CBDTHCR" No results found for: "D8THCCBX" No results found for: "D9THCCBX"  UDS:  Summary  Date Value Ref Range Status  05/30/2022 Note  Final    Comment:    ==================================================================== Compliance Drug Analysis, Ur ==================================================================== Test                             Result       Flag       Units  Drug Present and Declared for Prescription Verification   Alprazolam                     401          EXPECTED   ng/mg creat   Alpha-hydroxyalprazolam        656          EXPECTED   ng/mg creat    Source of alprazolam is a scheduled prescription medication. Alpha-    hydroxyalprazolam is an expected metabolite of alprazolam.    Hydrocodone                    1122         EXPECTED   ng/mg creat   Hydromorphone                  302          EXPECTED   ng/mg creat   Dihydrocodeine                 84           EXPECTED   ng/mg creat   Norhydrocodone                 766          EXPECTED   ng/mg creat    Sources of hydrocodone include scheduled prescription medications.    Hydromorphone, dihydrocodeine and norhydrocodone are expected    metabolites of hydrocodone. Hydromorphone and dihydrocodeine are    also available as scheduled prescription medications.    Acetaminophen                  PRESENT      EXPECTED  Drug Absent but Declared for Prescription Verification   Diphenhydramine                Not Detected UNEXPECTED   Promethazine                    Not Detected UNEXPECTED ==================================================================== Test                      Result    Flag   Units      Ref Range   Creatinine  95               mg/dL      >=62 ==================================================================== Declared Medications:  The flagging and interpretation on this report are based on the  following declared medications.  Unexpected results may arise from  inaccuracies in the declared medications.   **Note: The testing scope of this panel includes these medications:   Alprazolam (Xanax)  Diphenhydramine (Benadryl)  Hydrocodone (Norco)  Promethazine (Phenergan)   **Note: The testing scope of this panel does not include small to  moderate amounts of these reported medications:   Acetaminophen (Norco)   **Note: The testing scope of this panel does not include the  following reported medications:   Dexamethasone (Tobradex)  Menthol  Omeprazole (Prilosec)  Polyethylene Glycol  Sumatriptan (Imitrex)  Tobramycin (Tobradex) ==================================================================== For clinical consultation, please call [phone]. ====================================================================       ROS  Constitutional: Denies any fever or chills Gastrointestinal: No reported hemesis, hematochezia, vomiting, or acute GI distress Musculoskeletal: Denies any acute onset joint swelling, redness, loss of ROM, or weakness Neurological: No reported episodes of acute onset apraxia, aphasia, dysarthria, agnosia, amnesia, paralysis, loss of coordination, or loss of consciousness  Medication Review  ALPRAZolam, HYDROcodone-acetaminophen, Menthol (Topical Analgesic), Polyethyl Glycol-Propyl Glycol, SUMAtriptan, diphenhydrAMINE, hydrocortisone, omeprazole, promethazine, and triamcinolone cream  History Review  Allergy: Ms. Russi is allergic to aspirin, hydralazine, ibuprofen,  tizanidine, buspirone hcl, ciprofloxacin, citalopram, codeine, diazepam, duloxetine, imipramine hcl, oxycodone-acetaminophen, propoxyphene hcl, sulfonamide derivatives, tramadol hcl, venlafaxine, methocarbamol, pregabalin, risperidone, and savella [milnacipran hcl]. Drug: Ms. Herra  reports no history of drug use. Alcohol:  reports that she does not currently use alcohol. Tobacco:  reports that she quit smoking about 9 years ago. Her smoking use included cigarettes. She started smoking about 49 years ago. She has a 40 pack-year smoking history. She has never been exposed to tobacco smoke. She has never used smokeless tobacco. Social: Ms. Greenspan  reports that she quit smoking about 9 years ago. Her smoking use included cigarettes. She started smoking about 49 years ago. She has a 40 pack-year smoking history. She has never been exposed to tobacco smoke. She has never used smokeless tobacco. She reports that she does not currently use alcohol. She reports that she does not use drugs. Medical:  has a past medical history of Bowel obstruction (HCC), Chronic fatigue, Depression, Fibromyalgia, GERD (gastroesophageal reflux disease), Hyperlipemia, Migraine, Obstipation, Osteopenia, and PUD (peptic ulcer disease). Surgical: Ms. Smee  has a past surgical history that includes Appendectomy (2005); Cholecystectomy (2004); Total abdominal hysterectomy w/ bilateral salpingoophorectomy (1986); Abdominal surgery; Esophagogastroduodenoscopy (egd) with propofol (N/A, 06/09/2016); Colonoscopy with propofol (N/A, 06/09/2016); Givens capsule study (N/A, 06/23/2016); Pars plana vitrectomy w/ repair of macular hole (Left, 01/2017); Eye surgery; Breast biopsy (Bilateral, 1999); and Breast cyst aspiration (Right, 2003). Family: family history includes Arthritis/Rheumatoid in her sister; Breast cancer in her sister; COPD in her sister; Diabetes in her sister; Heart disease in her father, mother, and sister; Hypertension in her sister and  sister; Stroke in her mother.  Laboratory Chemistry Profile   Renal Lab Results  Component Value Date   BUN 16 04/21/2022   CREATININE 0.69 04/21/2022   GFR 85.96 04/21/2022   GFRAA >60 10/05/2017   GFRNONAA >60 10/08/2021    Hepatic Lab Results  Component Value Date   AST 25 04/21/2022   ALT 12 04/21/2022   ALBUMIN 4.1 04/21/2022   ALKPHOS 87 04/21/2022   HCVAB NEGATIVE 06/03/2015   LIPASE 31 12/18/2020  Electrolytes Lab Results  Component Value Date   NA 139 04/21/2022   K 3.9 04/21/2022   CL 102 04/21/2022   CALCIUM 9.5 04/21/2022   MG 2.3 05/30/2022   PHOS 3.7 03/25/2010    Bone Lab Results  Component Value Date   VD25OH 24.90 (L) 04/21/2022   25OHVITD1 40 05/30/2022   25OHVITD2 <1.0 05/30/2022   25OHVITD3 40 05/30/2022    Inflammation (CRP: Acute Phase) (ESR: Chronic Phase) Lab Results  Component Value Date   CRP 2 05/30/2022   ESRSEDRATE 16 05/30/2022         Note: Above Lab results reviewed.  Recent Imaging Review  DG PAIN CLINIC C-ARM 1-60 MIN NO REPORT Fluoro was used, but no Radiologist interpretation will be provided.  Please refer to "NOTES" tab for provider progress note. Note: Reviewed        Physical Exam  General appearance: Well nourished, well developed, and well hydrated. In no apparent acute distress Mental status: Alert, oriented x 3 (person, place, & time)       Respiratory: No evidence of acute respiratory distress Eyes: PERLA Vitals: There were no vitals taken for this visit. BMI: Estimated body mass index is 16.99 kg/m as calculated from the following:   Height as of 10/15/22: 5\' 4"  (1.626 m).   Weight as of 10/15/22: 99 lb (44.9 kg). Ideal: Female patients must weigh at least 45.5 kg to calculate ideal body weight  Assessment   Diagnosis Status  No diagnosis found. Controlled Controlled Controlled   Updated Problems: No problems updated.  Plan of Care  Problem-specific:  No problem-specific Assessment & Plan notes  found for this encounter.  Ms. TRANESE DELAWDER has a current medication list which includes the following long-term medication(s): diphenhydramine, omeprazole, promethazine, and sumatriptan.  Pharmacotherapy (Medications Ordered): No orders of the defined types were placed in this encounter.  Orders:  No orders of the defined types were placed in this encounter.  Follow-up plan:   No follow-ups on file.      Interventional Therapies  Risk Factors  Considerations:  ALLERGY: ASA, ibuprofen, codeine, oxycodone, propoxyphene, tramadol, tizanidine, methocarbamol, pregabalin, Savella.   Planned  Pending:   Diagnostic/Therapeutic right L2-3 LESI #1    Under consideration:   Diagnostic/Therapeutic right L2-3 LESI #1    Completed:   None at this time   Completed by other providers:   Therapeutic bilateral L4-5 TFESI x2 (08/21/2020, 09/01/2021) by Merri Ray, DO [facility] PMR)  Therapeutic right C4-5 TFESI x1 (03/05/2015) by Merri Ray, DO [facility] PMR)    Therapeutic  Palliative (PRN) options:   None established       Recent Visits Date Type Provider Dept  10/11/22 Procedure visit Delano Metz, MD [facility]  Showing recent visits within past 90 days and meeting all other requirements Future Appointments Date Type Provider Dept  10/25/22 Appointment Delano Metz, MD [facility]  Showing future appointments within next 90 days and meeting all other requirements  I discussed the assessment and treatment plan with the patient. The patient was provided an opportunity to ask questions and all were answered. The patient agreed with the plan and demonstrated an understanding of the instructions.  Patient advised to call back or seek an in-person evaluation if the symptoms or condition worsens.  Duration of encounter: *** minutes.  Total time on encounter, as per AMA guidelines included both the face-to-face and non-face-to-face time personally  spent by the physician and/or other qualified health care professional(s) on  the day of the encounter (includes time in activities that require the physician or other qualified health care professional and does not include time in activities normally performed by clinical staff). Physician's time may include the following activities when performed: Preparing to see the patient (e.g., pre-charting review of records, searching for previously ordered imaging, lab work, and nerve conduction tests) Review of prior analgesic pharmacotherapies. Reviewing PMP Interpreting ordered tests (e.g., lab work, imaging, nerve conduction tests) Performing post-procedure evaluations, including interpretation of diagnostic procedures Obtaining and/or reviewing separately obtained history Performing a medically appropriate examination and/or evaluation Counseling and educating the patient/family/caregiver Ordering medications, tests, or procedures Referring and communicating with other health care professionals (when not separately reported) Documenting clinical information in the electronic or other health record Independently interpreting results (not separately reported) and communicating results to the patient/ family/caregiver Care coordination (not separately reported)  Note by: Oswaldo Done, MD Date: 10/25/2022; Time: 3:39 PM

## 2022-10-25 ENCOUNTER — Ambulatory Visit: Payer: Medicare HMO | Attending: Pain Medicine | Admitting: Pain Medicine

## 2022-10-25 ENCOUNTER — Encounter: Payer: Self-pay | Admitting: Pain Medicine

## 2022-10-25 VITALS — BP 129/55 | HR 58 | Temp 97.1°F | Resp 16 | Ht 63.0 in | Wt 97.0 lb

## 2022-10-25 DIAGNOSIS — G8929 Other chronic pain: Secondary | ICD-10-CM | POA: Insufficient documentation

## 2022-10-25 DIAGNOSIS — M545 Low back pain, unspecified: Secondary | ICD-10-CM | POA: Insufficient documentation

## 2022-10-25 DIAGNOSIS — M25551 Pain in right hip: Secondary | ICD-10-CM | POA: Insufficient documentation

## 2022-10-25 DIAGNOSIS — M25562 Pain in left knee: Secondary | ICD-10-CM | POA: Diagnosis not present

## 2022-10-25 DIAGNOSIS — M25552 Pain in left hip: Secondary | ICD-10-CM | POA: Insufficient documentation

## 2022-10-25 DIAGNOSIS — M79604 Pain in right leg: Secondary | ICD-10-CM | POA: Diagnosis not present

## 2022-10-25 DIAGNOSIS — Z09 Encounter for follow-up examination after completed treatment for conditions other than malignant neoplasm: Secondary | ICD-10-CM | POA: Insufficient documentation

## 2022-10-25 DIAGNOSIS — M79605 Pain in left leg: Secondary | ICD-10-CM | POA: Diagnosis not present

## 2022-10-25 DIAGNOSIS — M25561 Pain in right knee: Secondary | ICD-10-CM | POA: Insufficient documentation

## 2022-10-27 ENCOUNTER — Other Ambulatory Visit: Payer: Self-pay | Admitting: Internal Medicine

## 2022-10-27 ENCOUNTER — Encounter (INDEPENDENT_AMBULATORY_CARE_PROVIDER_SITE_OTHER): Payer: Self-pay

## 2022-10-27 MED ORDER — PROMETHAZINE HCL 12.5 MG PO TABS: 12.5000 mg | ORAL_TABLET | Freq: Three times a day (TID) | ORAL | 0 refills | Status: AC | PRN

## 2022-10-27 MED ORDER — ALPRAZOLAM 1 MG PO TABS: 1.0000 mg | ORAL_TABLET | Freq: Three times a day (TID) | ORAL | 0 refills | Status: DC | PRN

## 2022-10-27 NOTE — Telephone Encounter (Signed)
.....................  Prescription Request  10/27/2022  LOV: 08/26/2022  What is the name of the medication or equipment? ALPRAZolam (XANAX) 1 MG tablet  promethazine (PHENERGAN) 12.5 MG tablet  Have you contacted your pharmacy to request a refill? No   Which pharmacy would you like this sent to?  [id number] [place] [address] [place] [address] Phone: [phone] Fax: [phone]    Patient notified that their request is being sent to the clinical staff for review and that they should receive a response within 2 business days.   Please advise at Mobile [phone] (mobile)

## 2022-11-02 DIAGNOSIS — H35373 Puckering of macula, bilateral: Secondary | ICD-10-CM | POA: Diagnosis not present

## 2022-11-23 DIAGNOSIS — S40862A Insect bite (nonvenomous) of left upper arm, initial encounter: Secondary | ICD-10-CM | POA: Diagnosis not present

## 2022-11-23 DIAGNOSIS — D692 Other nonthrombocytopenic purpura: Secondary | ICD-10-CM | POA: Diagnosis not present

## 2022-11-26 DIAGNOSIS — R21 Rash and other nonspecific skin eruption: Secondary | ICD-10-CM | POA: Diagnosis not present

## 2022-11-26 DIAGNOSIS — Z87891 Personal history of nicotine dependence: Secondary | ICD-10-CM | POA: Diagnosis not present

## 2022-11-26 DIAGNOSIS — Z888 Allergy status to other drugs, medicaments and biological substances status: Secondary | ICD-10-CM | POA: Diagnosis not present

## 2022-11-26 DIAGNOSIS — Z882 Allergy status to sulfonamides status: Secondary | ICD-10-CM | POA: Diagnosis not present

## 2022-11-26 DIAGNOSIS — L139 Bullous disorder, unspecified: Secondary | ICD-10-CM | POA: Diagnosis not present

## 2022-11-26 DIAGNOSIS — Q819 Epidermolysis bullosa, unspecified: Secondary | ICD-10-CM | POA: Diagnosis not present

## 2022-11-26 DIAGNOSIS — Z885 Allergy status to narcotic agent status: Secondary | ICD-10-CM | POA: Diagnosis not present

## 2022-11-26 DIAGNOSIS — Z886 Allergy status to analgesic agent status: Secondary | ICD-10-CM | POA: Diagnosis not present

## 2022-11-26 DIAGNOSIS — Z881 Allergy status to other antibiotic agents status: Secondary | ICD-10-CM | POA: Diagnosis not present

## 2022-11-26 DIAGNOSIS — D692 Other nonthrombocytopenic purpura: Secondary | ICD-10-CM | POA: Diagnosis not present

## 2022-11-28 ENCOUNTER — Ambulatory Visit (INDEPENDENT_AMBULATORY_CARE_PROVIDER_SITE_OTHER): Payer: Medicare HMO | Admitting: Internal Medicine

## 2022-11-28 ENCOUNTER — Encounter: Payer: Self-pay | Admitting: Internal Medicine

## 2022-11-28 VITALS — BP 136/82 | HR 70 | Temp 97.3°F | Ht 63.0 in | Wt 100.0 lb

## 2022-11-28 DIAGNOSIS — Z886 Allergy status to analgesic agent status: Secondary | ICD-10-CM | POA: Diagnosis not present

## 2022-11-28 DIAGNOSIS — D6869 Other thrombophilia: Secondary | ICD-10-CM

## 2022-11-28 DIAGNOSIS — Z87891 Personal history of nicotine dependence: Secondary | ICD-10-CM | POA: Diagnosis not present

## 2022-11-28 DIAGNOSIS — G894 Chronic pain syndrome: Secondary | ICD-10-CM

## 2022-11-28 DIAGNOSIS — Z79899 Other long term (current) drug therapy: Secondary | ICD-10-CM | POA: Diagnosis not present

## 2022-11-28 DIAGNOSIS — F1120 Opioid dependence, uncomplicated: Secondary | ICD-10-CM

## 2022-11-28 DIAGNOSIS — L299 Pruritus, unspecified: Secondary | ICD-10-CM | POA: Diagnosis not present

## 2022-11-28 DIAGNOSIS — Z881 Allergy status to other antibiotic agents status: Secondary | ICD-10-CM | POA: Diagnosis not present

## 2022-11-28 DIAGNOSIS — Z5181 Encounter for therapeutic drug level monitoring: Secondary | ICD-10-CM | POA: Diagnosis not present

## 2022-11-28 DIAGNOSIS — Z882 Allergy status to sulfonamides status: Secondary | ICD-10-CM | POA: Diagnosis not present

## 2022-11-28 DIAGNOSIS — Z885 Allergy status to narcotic agent status: Secondary | ICD-10-CM | POA: Diagnosis not present

## 2022-11-28 DIAGNOSIS — R21 Rash and other nonspecific skin eruption: Secondary | ICD-10-CM | POA: Diagnosis not present

## 2022-11-28 DIAGNOSIS — Z888 Allergy status to other drugs, medicaments and biological substances status: Secondary | ICD-10-CM | POA: Diagnosis not present

## 2022-11-28 LAB — COMPREHENSIVE METABOLIC PANEL
ALT: 13 U/L (ref 0–35)
AST: 25 U/L (ref 0–37)
Albumin: 4.1 g/dL (ref 3.5–5.2)
Alkaline Phosphatase: 85 U/L (ref 39–117)
BUN: 11 mg/dL (ref 6–23)
CO2: 29 meq/L (ref 19–32)
Calcium: 9.9 mg/dL (ref 8.4–10.5)
Chloride: 102 meq/L (ref 96–112)
Creatinine, Ser: 0.69 mg/dL (ref 0.40–1.20)
GFR: 85.59 mL/min (ref >60.00)
Glucose, Bld: 104 mg/dL — ABNORMAL HIGH (ref 70–99)
Potassium: 3.4 meq/L — ABNORMAL LOW (ref 3.5–5.1)
Sodium: 140 meq/L (ref 135–145)
Total Bilirubin: 0.4 mg/dL (ref 0.2–1.2)
Total Protein: 7 g/dL (ref 6.0–8.3)

## 2022-11-28 LAB — CBC
HCT: 37 % (ref 36.0–46.0)
Hemoglobin: 12.4 g/dL (ref 12.0–15.0)
MCHC: 33.4 g/dL (ref 30.0–36.0)
MCV: 94 fl (ref 78.0–100.0)
Platelets: 289 10*3/uL (ref 150.0–400.0)
RBC: 3.94 Mil/uL (ref 3.87–5.11)
RDW: 12.9 % (ref 11.5–15.5)
WBC: 8.8 10*3/uL (ref 4.0–10.5)

## 2022-11-28 MED ORDER — HYDROXYZINE HCL 10 MG PO TABS: 10.0000 mg | ORAL_TABLET | Freq: Three times a day (TID) | ORAL | 0 refills | Status: DC | PRN

## 2022-11-28 NOTE — Assessment & Plan Note (Signed)
Has responded to the steroid injections Still uses the hydrocodone--within her allowed parameters

## 2022-11-28 NOTE — Assessment & Plan Note (Addendum)
Has pruritus that may be anxiety related Then seems to be bleeding into skin from the scratching Will check CBC (for platelets) and LFTs just in case Needs to go back to derm--doesn't look like bullous pemphigoid  Try hydroxyzine---to see if that works better than benedryl

## 2022-11-28 NOTE — Assessment & Plan Note (Signed)
PDMP reviewed No concerns

## 2022-11-28 NOTE — Progress Notes (Signed)
Subjective:    Patient ID: Lindsey Ward, female    DOB: September 17, 1950, 72 y.o.   MRN: [id number]  HPI Here for follow up of chronic pain and narcotic dependence  In the ER in past 2 nights due to rash and itching Has had some raised areas with fluid--concern for bullous pemphigoid Started on prednisone Then next night (today)--not mentioned  She notes ongoing itching---all body Has to scratch Is going back to dermatologist  Pain is better Only one bad spell since my last visit Did have 3 injections from Dr Shireen Quan Still uses the hydrocodone  Current Outpatient Medications on File Prior to Visit  Medication Sig Dispense Refill   ALPRAZolam (XANAX) 1 MG tablet Take 1 tablet (1 mg total) by mouth 3 (three) times daily as needed. 90 tablet 0   diphenhydrAMINE (BENADRYL) 25 mg capsule Take 25 mg by mouth every 6 (six) hours as needed.     HYDROcodone-acetaminophen (NORCO/VICODIN) 5-325 MG tablet Take 1 tablet by mouth 3 (three) times daily as needed. Dx Code M79.7 90 tablet 0   hydrocortisone 2.5 % cream Apply topically 3 (three) times daily as needed. 28 g 3   Menthol, Topical Analgesic, (BIOFREEZE EX) Apply 1 application  topically as needed.     omeprazole (PRILOSEC) 20 MG capsule TAKE 1 CAPSULE BY MOUTH EVERY DAY 90 capsule 3   Polyethyl Glycol-Propyl Glycol (SYSTANE OP) Apply to eye as needed.     predniSONE (DELTASONE) 5 MG tablet Take 5 mg by mouth. Take 4 tablets (20 mg total) by mouth daily for 20 days, THEN 3 tablets (15 mg total) daily for 5 days, THEN 2 tablets (10 mg total) daily for 5 days, THEN 1 tablet (5 mg total) daily for 5 days, THEN 0.5 tablets (2.5 mg total) daily for 4 days.     promethazine (PHENERGAN) 12.5 MG tablet Take 1-2 tablets (12.5-25 mg total) by mouth 3 (three) times daily as needed for nausea or vomiting. 60 tablet 0   SUMAtriptan (IMITREX) 50 MG tablet TAKE 0.5-1 TABLET BY MOUTH DAILY AS NEEDED FOR MIGRAINE. 9 tablet 11   triamcinolone cream (KENALOG)  0.1 % Apply 1 Application topically 2 (two) times daily. 30 g 0   No current facility-administered medications on file prior to visit.    Allergies  Allergen Reactions   Aspirin Other (See Comments)    REACTION: GI bleed   Hydralazine Other (See Comments)    hallucinations   Ibuprofen Other (See Comments)    REACTION: GI bleed   Tizanidine Other (See Comments)    "throws me in a different world"   Buspirone Hcl     REACTION: unspecified   Ciprofloxacin     REACTION: unspecified   Citalopram Other (See Comments)    Ear ringing.    Codeine    Diazepam    Duloxetine     REACTION: sz like activity   Imipramine Hcl Other (See Comments)    Ear ringing.    Oxycodone-Acetaminophen     REACTION: unspecified   Propoxyphene Hcl    Sulfonamide Derivatives     REACTION: unspecified   Tramadol Hcl Other (See Comments)    REACTION: doesn't remember what happened but couldn't tolerate    Venlafaxine     REACTION: unspecified   Methocarbamol Rash   Pregabalin Other (See Comments)    REACTION: kept her up and confusion   Risperidone Other (See Comments)    REACTION: confusion   Savella [Milnacipran Hcl] Palpitations and  Other (See Comments)    Dizziness, Body wouldn't move, SOB, etc    Past Medical History:  Diagnosis Date   Bowel obstruction (HCC)    Chronic fatigue    Depression    Fibromyalgia    GERD (gastroesophageal reflux disease)    Hyperlipemia    Migraine    Obstipation    Osteopenia    PUD (peptic ulcer disease)     Past Surgical History:  Procedure Laterality Date   ABDOMINAL SURGERY     APPENDECTOMY  2005   BREAST BIOPSY Bilateral 1999   rt w/out clip - neg, lt w/clip - neg   BREAST CYST ASPIRATION Right 2003   neg   CHOLECYSTECTOMY  2004   COLONOSCOPY WITH PROPOFOL N/A 06/09/2016   Procedure: COLONOSCOPY WITH PROPOFOL;  Surgeon: Wyline Mood, MD;  Location: [facility];  Service: Endoscopy;  Laterality: N/A;   ESOPHAGOGASTRODUODENOSCOPY (EGD) WITH  PROPOFOL N/A 06/09/2016   Procedure: ESOPHAGOGASTRODUODENOSCOPY (EGD) WITH PROPOFOL;  Surgeon: Wyline Mood, MD;  Location: [facility];  Service: Endoscopy;  Laterality: N/A;   EYE SURGERY     GIVENS CAPSULE STUDY N/A 06/23/2016   Procedure: GIVENS CAPSULE STUDY;  Surgeon: Wyline Mood, MD;  Location: [facility];  Service: Endoscopy;  Laterality: N/A;   PARS PLANA VITRECTOMY W/ REPAIR OF MACULAR HOLE Left 01/2017   [facility]   TOTAL ABDOMINAL HYSTERECTOMY W/ BILATERAL SALPINGOOPHORECTOMY  1986    Family History  Problem Relation Age of Onset   Heart disease Mother        AMI   Stroke Mother    Heart disease Father    Hypertension Sister    Breast cancer Sister    Hypertension Sister    Heart disease Sister        MI   Diabetes Sister    Arthritis/Rheumatoid Sister    COPD Sister    Kidney cancer Neg Hx    Kidney disease Neg Hx    Prostate cancer Neg Hx     Social History   Socioeconomic History   Marital status: Divorced    Spouse name: Not on file   Number of children: 1   Years of education: Not on file   Highest education level: Not on file  Occupational History   Occupation: DISABLED  Tobacco Use   Smoking status: Former    Current packs/day: 0.00    Average packs/day: 1 pack/day for 40.0 years (40.0 ttl pk-yrs)    Types: Cigarettes    Start date: 09/25/1973    Quit date: 09/25/2013    Years since quitting: 9.1    Passive exposure: Never   Smokeless tobacco: Never   Tobacco comments:       Vaping Use   Vaping status: Never Used  Substance and Sexual Activity   Alcohol use: Not Currently    Alcohol/week: 0.0 standard drinks of alcohol    Comment: rarely   Drug use: No   Sexual activity: Not on file  Other Topics Concern   Not on file  Social History Narrative   Living alone again      No living will   Daughter should make health care decisions   Requests DNR---done 10/24/14   No tube feeds if cognitively unaware   Highest level of education: 9th    Social Determinants of Health   Financial Resource Strain: Not on file  Food Insecurity: No Food Insecurity (10/24/2022)   Hunger Vital Sign    Worried About Running Out of Food in  the Last Year: Never true    Ran Out of Food in the Last Year: Never true  Transportation Needs: Not on file  Physical Activity: Not on file  Stress: Not on file  Social Connections: Not on file  Intimate Partner Violence: Not on file   Review of Systems Only spent one day in bed recently--much better Eating okay Weight up slightly     Objective:   Physical Exam Constitutional:      Appearance: Normal appearance.  Skin:    Comments: Petechiae and ecchymoses on arms mostly Axillae clear No bullae  Neurological:     Mental Status: She is alert.  Psychiatric:        Mood and Affect: Mood normal.        Behavior: Behavior normal.            Assessment & Plan:

## 2022-11-29 DIAGNOSIS — L821 Other seborrheic keratosis: Secondary | ICD-10-CM | POA: Diagnosis not present

## 2022-11-29 DIAGNOSIS — L298 Other pruritus: Secondary | ICD-10-CM | POA: Diagnosis not present

## 2022-11-29 DIAGNOSIS — L538 Other specified erythematous conditions: Secondary | ICD-10-CM | POA: Diagnosis not present

## 2022-11-29 DIAGNOSIS — L820 Inflamed seborrheic keratosis: Secondary | ICD-10-CM | POA: Diagnosis not present

## 2022-11-29 DIAGNOSIS — D692 Other nonthrombocytopenic purpura: Secondary | ICD-10-CM | POA: Diagnosis not present

## 2022-12-16 ENCOUNTER — Telehealth: Payer: Self-pay | Admitting: Internal Medicine

## 2022-12-16 ENCOUNTER — Ambulatory Visit (INDEPENDENT_AMBULATORY_CARE_PROVIDER_SITE_OTHER): Payer: Medicare HMO | Admitting: Primary Care

## 2022-12-16 ENCOUNTER — Encounter: Payer: Self-pay | Admitting: Primary Care

## 2022-12-16 VITALS — BP 130/60 | HR 70 | Temp 98.8°F | Ht 63.0 in | Wt 98.0 lb

## 2022-12-16 DIAGNOSIS — R079 Chest pain, unspecified: Secondary | ICD-10-CM | POA: Diagnosis not present

## 2022-12-16 LAB — BASIC METABOLIC PANEL
BUN: 18 mg/dL (ref 6–23)
CO2: 33 meq/L — ABNORMAL HIGH (ref 19–32)
Calcium: 9.5 mg/dL (ref 8.4–10.5)
Chloride: 96 meq/L (ref 96–112)
Creatinine, Ser: 0.79 mg/dL (ref 0.40–1.20)
GFR: 73.75 mL/min (ref >60.00)
Glucose, Bld: 110 mg/dL — ABNORMAL HIGH (ref 70–99)
Potassium: 3.7 meq/L (ref 3.5–5.1)
Sodium: 137 meq/L (ref 135–145)

## 2022-12-16 LAB — CBC
HCT: 38.3 % (ref 36.0–46.0)
Hemoglobin: 12.5 g/dL (ref 12.0–15.0)
MCHC: 32.5 g/dL (ref 30.0–36.0)
MCV: 94.7 fL (ref 78.0–100.0)
Platelets: 316 10*3/uL (ref 150.0–400.0)
RBC: 4.04 Mil/uL (ref 3.87–5.11)
RDW: 12.6 % (ref 11.5–15.5)
WBC: 12.8 10*3/uL — ABNORMAL HIGH (ref 4.0–10.5)

## 2022-12-16 NOTE — Telephone Encounter (Signed)
Noted, will evaluate. 

## 2022-12-16 NOTE — Patient Instructions (Addendum)
Stop by the lab prior to leaving today. I will notify you of your results once received.   Call the cardiologist for a follow up visit.  Go to the hospital if your pain returns.   Try adding famotidine (Pepcid) 20 mg once daily if you feel heartburn symptoms.  It was a pleasure meeting you!

## 2022-12-16 NOTE — Assessment & Plan Note (Addendum)
Differentials include CAD, GERD, medication side effects. Less likely prednisone side effects, but discussed that she could contact dermatology for permission to discontinue.   ECG today with NSR, rate of 64. No PAC/PVC, acute ST changes, t-wave inversion. Appears similar to ECGs from August and April 2024. Reviewed cardiology notes from March 2024.  Reviewed Holter monitor results from 2023. Do not see where she's undergone stress test.   Given reassuring ECG, do not see need for ED evaluation. Checking labs today. Thyroid studies have historically been unremarkable with last one in February 2024.   Recommended she contact her cardiologist for further evaluation and stress test if warranted.  Consider adding famotidine vs increasing omeprazole if symptoms return.  Strict ED precautions provided.

## 2022-12-16 NOTE — Telephone Encounter (Signed)
I spoke with pt; pt said 12/16/22 at 5 Am pt was woke up with dull, mid CP that was constant for 30 mins. There was no radiation of pain and pain just went away after about 30 mins. Pt is presently taking prednisone. Pt did not have SOB.pt does not have way to ck BP. Pt has no CP now. Pt scheduled appt to see Allayne Gitelman NP 12/16/22 at 12 noon with UC & ED precautions and pt voiced understanding. Sending note  to Allayne Gitelman NP and [facility] pool.

## 2022-12-16 NOTE — Telephone Encounter (Signed)
FYI: This call has been transferred to Access Nurse. Once the result note has been entered staff can address the message at that time.  Patient called in with the following symptoms:  Red Word:chest pain Patient called in and stated that since starting  predniSONE (DELTASONE) 5 MG tablet she has been experiencing a lot of side effects. She stated that last night she had chest pain for an hour to an hour and a half. She wasn't sure if this was due to the medication.  Please advise at [facility] [phone]  Message is routed to Provider Pool and [facility] Triage

## 2022-12-16 NOTE — Progress Notes (Addendum)
Subjective:    Patient ID: Lindsey Ward, female    DOB: 1950/05/07, 72 y.o.   MRN: [id number]  Chest Pain  Pertinent negatives include no abdominal pain, diaphoresis, dizziness, headaches, nausea, shortness of breath or vomiting.    Lindsey Ward is a very pleasant 72 y.o. female patient of Dr. Carola Rhine with a history of migraines, CAD, GERD, chronic pain syndrome, mood disorder, narcotic dependence, malnutrition, who presents today to discuss chest pain.  Her chest pain was located to the mid anterior chest which woke her from sleep at 5 am today. She described her pain as dull, constant that lasted for about 30 minutes. She denies radiation of pain, SOB, nausea, diaphoresis, abdominal pain. She has been pain free since.   She began taking prednisone on 11/27/22 per dermatology due to a wide spread rash and itching. She questions if her symptoms are secondary to prednisone. She is compliant to omeprazole 20 mg daily.   Following with cardiology, last office visit was in March 2024. She has undergone [employer] Holter monitor which revealed occasional paroxysmal SVT without significant sustained arrhythmias. She has LAD and left circumflex calcifications from CT in 2019, and diffuse calcification of aorta. intolerant to statins and aspirin.   She completed CBC and CMP 2 weeks ago per PCP, mild hypokalemia, otherwise nothing abnormal.    Review of Systems  Constitutional:  Positive for fatigue. Negative for diaphoresis.  Respiratory:  Negative for shortness of breath.   Cardiovascular:  Positive for chest pain.  Gastrointestinal:  Negative for abdominal pain, nausea and vomiting.  Neurological:  Negative for dizziness, light-headedness and headaches.         Past Medical History:  Diagnosis Date   Bowel obstruction (HCC)    Chronic fatigue    Depression    Fibromyalgia    GERD (gastroesophageal reflux disease)    Hyperlipemia    Migraine    Obstipation    Osteopenia    PUD (peptic  ulcer disease)     Social History   Socioeconomic History   Marital status: Divorced    Spouse name: Not on file   Number of children: 1   Years of education: Not on file   Highest education level: Not on file  Occupational History   Occupation: DISABLED  Tobacco Use   Smoking status: Former    Current packs/day: 0.00    Average packs/day: 1 pack/day for 40.0 years (40.0 ttl pk-yrs)    Types: Cigarettes    Start date: 09/25/1973    Quit date: 09/25/2013    Years since quitting: 9.2    Passive exposure: Never   Smokeless tobacco: Never   Tobacco comments:       Vaping Use   Vaping status: Never Used  Substance and Sexual Activity   Alcohol use: Not Currently    Alcohol/week: 0.0 standard drinks of alcohol    Comment: rarely   Drug use: No   Sexual activity: Not on file  Other Topics Concern   Not on file  Social History Narrative   Living alone again      No living will   Daughter should make health care decisions   Requests DNR---done 10/24/14   No tube feeds if cognitively unaware   Highest level of education: 9th   Social Determinants of Health   Financial Resource Strain: Not on file  Food Insecurity: No Food Insecurity (10/24/2022)   Hunger Vital Sign    Worried About Running Out of Food  in the Last Year: Never true    Ran Out of Food in the Last Year: Never true  Transportation Needs: Not on file  Physical Activity: Not on file  Stress: Not on file  Social Connections: Not on file  Intimate Partner Violence: Not on file    Past Surgical History:  Procedure Laterality Date   ABDOMINAL SURGERY     APPENDECTOMY  2005   BREAST BIOPSY Bilateral 1999   rt w/out clip - neg, lt w/clip - neg   BREAST CYST ASPIRATION Right 2003   neg   CHOLECYSTECTOMY  2004   COLONOSCOPY WITH PROPOFOL N/A 06/09/2016   Procedure: COLONOSCOPY WITH PROPOFOL;  Surgeon: Wyline Mood, MD;  Location: [facility];  Service: Endoscopy;  Laterality: N/A;   ESOPHAGOGASTRODUODENOSCOPY  (EGD) WITH PROPOFOL N/A 06/09/2016   Procedure: ESOPHAGOGASTRODUODENOSCOPY (EGD) WITH PROPOFOL;  Surgeon: Wyline Mood, MD;  Location: [facility];  Service: Endoscopy;  Laterality: N/A;   EYE SURGERY     GIVENS CAPSULE STUDY N/A 06/23/2016   Procedure: GIVENS CAPSULE STUDY;  Surgeon: Wyline Mood, MD;  Location: [facility];  Service: Endoscopy;  Laterality: N/A;   PARS PLANA VITRECTOMY W/ REPAIR OF MACULAR HOLE Left 01/2017   [facility]   TOTAL ABDOMINAL HYSTERECTOMY W/ BILATERAL SALPINGOOPHORECTOMY  1986    Family History  Problem Relation Age of Onset   Heart disease Mother        AMI   Stroke Mother    Heart disease Father    Hypertension Sister    Breast cancer Sister    Hypertension Sister    Heart disease Sister        MI   Diabetes Sister    Arthritis/Rheumatoid Sister    COPD Sister    Kidney cancer Neg Hx    Kidney disease Neg Hx    Prostate cancer Neg Hx     Allergies  Allergen Reactions   Aspirin Other (See Comments)    REACTION: GI bleed   Hydralazine Other (See Comments)    hallucinations   Ibuprofen Other (See Comments)    REACTION: GI bleed   Tizanidine Other (See Comments)    "throws me in a different world"   Buspirone Hcl     REACTION: unspecified   Ciprofloxacin     REACTION: unspecified   Citalopram Other (See Comments)    Ear ringing.    Codeine    Diazepam    Duloxetine     REACTION: sz like activity   Imipramine Hcl Other (See Comments)    Ear ringing.    Oxycodone-Acetaminophen     REACTION: unspecified   Propoxyphene Hcl    Sulfonamide Derivatives     REACTION: unspecified   Tramadol Hcl Other (See Comments)    REACTION: doesn't remember what happened but couldn't tolerate    Venlafaxine     REACTION: unspecified   Methocarbamol Rash   Pregabalin Other (See Comments)    REACTION: kept her up and confusion   Risperidone Other (See Comments)    REACTION: confusion   Savella [Milnacipran Hcl] Palpitations and Other (See Comments)     Dizziness, Body wouldn't move, SOB, etc    Current Outpatient Medications on File Prior to Visit  Medication Sig Dispense Refill   ALPRAZolam (XANAX) 1 MG tablet Take 1 tablet (1 mg total) by mouth 3 (three) times daily as needed. 90 tablet 0   HYDROcodone-acetaminophen (NORCO/VICODIN) 5-325 MG tablet Take 1 tablet by mouth 3 (three) times daily as needed. Dx  Code M79.7 90 tablet 0   hydrocortisone 2.5 % cream Apply topically 3 (three) times daily as needed. 28 g 3   hydrOXYzine (ATARAX) 10 MG tablet Take 1 tablet (10 mg total) by mouth 3 (three) times daily as needed for itching. Don't take with xanax 30 tablet 0   omeprazole (PRILOSEC) 20 MG capsule TAKE 1 CAPSULE BY MOUTH EVERY DAY 90 capsule 3   Polyethyl Glycol-Propyl Glycol (SYSTANE OP) Apply to eye as needed.     predniSONE (DELTASONE) 5 MG tablet Take 5 mg by mouth. Take 4 tablets (20 mg total) by mouth daily for 20 days, THEN 3 tablets (15 mg total) daily for 5 days, THEN 2 tablets (10 mg total) daily for 5 days, THEN 1 tablet (5 mg total) daily for 5 days, THEN 0.5 tablets (2.5 mg total) daily for 4 days.     promethazine (PHENERGAN) 12.5 MG tablet Take 1-2 tablets (12.5-25 mg total) by mouth 3 (three) times daily as needed for nausea or vomiting. 60 tablet 0   SUMAtriptan (IMITREX) 50 MG tablet TAKE 0.5-1 TABLET BY MOUTH DAILY AS NEEDED FOR MIGRAINE. 9 tablet 11   triamcinolone cream (KENALOG) 0.1 % Apply 1 Application topically 2 (two) times daily. 30 g 0   Menthol, Topical Analgesic, (BIOFREEZE EX) Apply 1 application  topically as needed. (Patient not taking: Reported on 12/16/2022)     No current facility-administered medications on file prior to visit.    BP 130/60   Pulse 70   Temp 98.8 F (37.1 C) (Oral)   Ht 5\' 3"  (1.6 m)   Wt 98 lb (44.5 kg)   SpO2 98%   BMI 17.36 kg/m  Objective:   Physical Exam Cardiovascular:     Rate and Rhythm: Normal rate and regular rhythm.  Pulmonary:     Effort: Pulmonary effort is  normal.     Breath sounds: Normal breath sounds.  Musculoskeletal:     Cervical back: Neck supple.  Skin:    General: Skin is warm and dry.  Neurological:     Mental Status: She is alert and oriented to person, place, and time.  Psychiatric:        Mood and Affect: Mood normal.           Assessment & Plan:  Chest pain, unspecified type Assessment & Plan: Differentials include CAD, GERD, medication side effects. Less likely prednisone side effects, but discussed that she could contact dermatology for permission to discontinue.   ECG today with NSR, rate of 64. No PAC/PVC, acute ST changes, t-wave inversion. Appears similar to ECGs from August and April 2024. Reviewed cardiology notes from March 2024.  Reviewed Holter monitor results from 2023. Do not see where she's undergone stress test.   Given reassuring ECG, do not see need for ED evaluation. Checking labs today. Thyroid studies have historically been unremarkable with last one in February 2024.   Recommended she contact her cardiologist for further evaluation and stress test if warranted.  Consider adding famotidine vs increasing omeprazole if symptoms return.  Strict ED precautions provided.   Orders: -     EKG 12-Lead -     Basic metabolic panel -     CBC        Doreene Nest, NP

## 2022-12-19 ENCOUNTER — Other Ambulatory Visit: Payer: Self-pay | Admitting: Internal Medicine

## 2022-12-19 MED ORDER — ALPRAZOLAM 1 MG PO TABS: 1.0000 mg | ORAL_TABLET | Freq: Three times a day (TID) | ORAL | 0 refills | Status: DC | PRN

## 2022-12-19 NOTE — Telephone Encounter (Signed)
Prescription Request  12/19/2022  LOV: 11/28/2022  What is the name of the medication or equipment? ALPRAZolam (XANAX) 1 MG tablet, she stated that she never took the Hydrozyxine  Have you contacted your pharmacy to request a refill? No   Which pharmacy would you like this sent to?  [id number] [facility] [address] [place] [address] Phone: [phone] Fax: [phone]    Patient notified that their request is being sent to the clinical staff for review and that they should receive a response within 2 business days.   Please advise at [facility] [phone]

## 2022-12-19 NOTE — Telephone Encounter (Signed)
Last filled 10-27-22 #90 Last OV Acute 12-16-22 Next OV 02-28-23 [facility]

## 2022-12-20 DIAGNOSIS — L2989 Other pruritus: Secondary | ICD-10-CM | POA: Diagnosis not present

## 2022-12-20 DIAGNOSIS — D692 Other nonthrombocytopenic purpura: Secondary | ICD-10-CM | POA: Diagnosis not present

## 2022-12-20 DIAGNOSIS — L309 Dermatitis, unspecified: Secondary | ICD-10-CM | POA: Diagnosis not present

## 2022-12-20 DIAGNOSIS — L258 Unspecified contact dermatitis due to other agents: Secondary | ICD-10-CM | POA: Diagnosis not present

## 2023-01-11 ENCOUNTER — Ambulatory Visit (INDEPENDENT_AMBULATORY_CARE_PROVIDER_SITE_OTHER): Payer: Medicare HMO | Admitting: Internal Medicine

## 2023-01-11 ENCOUNTER — Encounter: Payer: Self-pay | Admitting: Internal Medicine

## 2023-01-11 VITALS — BP 120/60 | HR 67 | Temp 97.8°F | Ht 63.0 in | Wt 98.0 lb

## 2023-01-11 DIAGNOSIS — L659 Nonscarring hair loss, unspecified: Secondary | ICD-10-CM | POA: Diagnosis not present

## 2023-01-11 LAB — COMPREHENSIVE METABOLIC PANEL
ALT: 15 U/L (ref 0–35)
AST: 34 U/L (ref 0–37)
Albumin: 4.1 g/dL (ref 3.5–5.2)
Alkaline Phosphatase: 98 U/L (ref 39–117)
BUN: 13 mg/dL (ref 6–23)
CO2: 29 meq/L (ref 19–32)
Calcium: 9.8 mg/dL (ref 8.4–10.5)
Chloride: 103 meq/L (ref 96–112)
Creatinine, Ser: 0.85 mg/dL (ref 0.40–1.20)
GFR: 67.51 mL/min (ref >60.00)
Glucose, Bld: 93 mg/dL (ref 70–99)
Potassium: 4 meq/L (ref 3.5–5.1)
Sodium: 140 meq/L (ref 135–145)
Total Bilirubin: 0.5 mg/dL (ref 0.2–1.2)
Total Protein: 6.6 g/dL (ref 6.0–8.3)

## 2023-01-11 LAB — CBC
HCT: 36.3 % (ref 36.0–46.0)
Hemoglobin: 11.9 g/dL — ABNORMAL LOW (ref 12.0–15.0)
MCHC: 32.7 g/dL (ref 30.0–36.0)
MCV: 93.2 fL (ref 78.0–100.0)
Platelets: 435 10*3/uL — ABNORMAL HIGH (ref 150.0–400.0)
RBC: 3.89 Mil/uL (ref 3.87–5.11)
RDW: 12.5 % (ref 11.5–15.5)
WBC: 8.1 10*3/uL (ref 4.0–10.5)

## 2023-01-11 LAB — TSH: TSH: 1.83 u[IU]/mL (ref 0.35–5.50)

## 2023-01-11 MED ORDER — HYDROCORTISONE 2.5 % EX LOTN: TOPICAL_LOTION | Freq: Two times a day (BID) | CUTANEOUS | 1 refills | Status: AC

## 2023-01-11 NOTE — Progress Notes (Signed)
Subjective:    Patient ID: Lindsey Ward, female    DOB: 03/29/1950, 72 y.o.   MRN: [id number]  HPI Here due to hair loss  Has noticed her hair falling out Some itching in the front--and then moves down head Goes back 3 months No rash there  Did see dermatologist in [place] Given Rx for cerave with steroid---neck down  Switched to Head & Shoulders for itching---no change  1% HC lotion helps briefly Has salicylic acid shampoo as well  Current Outpatient Medications on File Prior to Visit  Medication Sig Dispense Refill   ALPRAZolam (XANAX) 1 MG tablet Take 1 tablet (1 mg total) by mouth 3 (three) times daily as needed. 90 tablet 0   HYDROcodone-acetaminophen (NORCO/VICODIN) 5-325 MG tablet Take 1 tablet by mouth 3 (three) times daily as needed. Dx Code M79.7 90 tablet 0   hydrocortisone 2.5 % cream Apply topically 2 (two) times daily.     Menthol, Topical Analgesic, (BIOFREEZE EX) Apply 1 application  topically as needed.     omeprazole (PRILOSEC) 20 MG capsule TAKE 1 CAPSULE BY MOUTH EVERY DAY 90 capsule 3   Polyethyl Glycol-Propyl Glycol (SYSTANE OP) Apply to eye as needed.     promethazine (PHENERGAN) 12.5 MG tablet Take 1-2 tablets (12.5-25 mg total) by mouth 3 (three) times daily as needed for nausea or vomiting. 60 tablet 0   SUMAtriptan (IMITREX) 50 MG tablet TAKE 0.5-1 TABLET BY MOUTH DAILY AS NEEDED FOR MIGRAINE. 9 tablet 11   triamcinolone cream (KENALOG) 0.1 % Apply 1 Application topically 2 (two) times daily. 30 g 0   No current facility-administered medications on file prior to visit.    Allergies  Allergen Reactions   Aspirin Other (See Comments)    REACTION: GI bleed   Hydralazine Other (See Comments)    hallucinations   Ibuprofen Other (See Comments)    REACTION: GI bleed   Tizanidine Other (See Comments)    "throws me in a different world"   Buspirone Hcl     REACTION: unspecified   Ciprofloxacin     REACTION: unspecified   Citalopram Other (See  Comments)    Ear ringing.    Codeine    Diazepam    Duloxetine     REACTION: sz like activity   Imipramine Hcl Other (See Comments)    Ear ringing.    Oxycodone-Acetaminophen     REACTION: unspecified   Propoxyphene Hcl    Sulfonamide Derivatives     REACTION: unspecified   Tramadol Hcl Other (See Comments)    REACTION: doesn't remember what happened but couldn't tolerate    Venlafaxine     REACTION: unspecified   Methocarbamol Rash   Pregabalin Other (See Comments)    REACTION: kept her up and confusion   Risperidone Other (See Comments)    REACTION: confusion   Savella [Milnacipran Hcl] Palpitations and Other (See Comments)    Dizziness, Body wouldn't move, SOB, etc    Past Medical History:  Diagnosis Date   Bowel obstruction (HCC)    Chronic fatigue    Depression    Fibromyalgia    GERD (gastroesophageal reflux disease)    Hyperlipemia    Migraine    Obstipation    Osteopenia    PUD (peptic ulcer disease)     Past Surgical History:  Procedure Laterality Date   ABDOMINAL SURGERY     APPENDECTOMY  2005   BREAST BIOPSY Bilateral 1999   rt w/out clip - neg, lt  w/clip - neg   BREAST CYST ASPIRATION Right 2003   neg   CHOLECYSTECTOMY  2004   COLONOSCOPY WITH PROPOFOL N/A 06/09/2016   Procedure: COLONOSCOPY WITH PROPOFOL;  Surgeon: Wyline Mood, MD;  Location: [facility] ENDOSCOPY;  Service: Endoscopy;  Laterality: N/A;   ESOPHAGOGASTRODUODENOSCOPY (EGD) WITH PROPOFOL N/A 06/09/2016   Procedure: ESOPHAGOGASTRODUODENOSCOPY (EGD) WITH PROPOFOL;  Surgeon: Wyline Mood, MD;  Location: [facility] ENDOSCOPY;  Service: Endoscopy;  Laterality: N/A;   EYE SURGERY     GIVENS CAPSULE STUDY N/A 06/23/2016   Procedure: GIVENS CAPSULE STUDY;  Surgeon: Wyline Mood, MD;  Location: [facility] ENDOSCOPY;  Service: Endoscopy;  Laterality: N/A;   PARS PLANA VITRECTOMY W/ REPAIR OF MACULAR HOLE Left 01/2017   [facility]   TOTAL ABDOMINAL HYSTERECTOMY W/ BILATERAL SALPINGOOPHORECTOMY  1986    Family History   Problem Relation Age of Onset   Heart disease Mother        AMI   Stroke Mother    Heart disease Father    Hypertension Sister    Breast cancer Sister    Hypertension Sister    Heart disease Sister        MI   Diabetes Sister    Arthritis/Rheumatoid Sister    COPD Sister    Kidney cancer Neg Hx    Kidney disease Neg Hx    Prostate cancer Neg Hx     Social History   Socioeconomic History   Marital status: Divorced    Spouse name: Not on file   Number of children: 1   Years of education: Not on file   Highest education level: Not on file  Occupational History   Occupation: DISABLED  Tobacco Use   Smoking status: Former    Current packs/day: 0.00    Average packs/day: 1 pack/day for 40.0 years (40.0 ttl pk-yrs)    Types: Cigarettes    Start date: 09/25/1973    Quit date: 09/25/2013    Years since quitting: 9.3    Passive exposure: Never   Smokeless tobacco: Never   Tobacco comments:       Vaping Use   Vaping status: Never Used  Substance and Sexual Activity   Alcohol use: Not Currently    Alcohol/week: 0.0 standard drinks of alcohol    Comment: rarely   Drug use: No   Sexual activity: Not on file  Other Topics Concern   Not on file  Social History Narrative   Living alone again      No living will   Daughter should make health care decisions   Requests DNR---done 10/24/14   No tube feeds if cognitively unaware   Highest level of education: 9th   Social Determinants of Health   Financial Resource Strain: Not on file  Food Insecurity: No Food Insecurity (10/24/2022)   Hunger Vital Sign    Worried About Running Out of Food in the Last Year: Never true    Ran Out of Food in the Last Year: Never true  Transportation Needs: Not on file  Physical Activity: Not on file  Stress: Not on file  Social Connections: Not on file  Intimate Partner Violence: Not on file   Review of Systems Some generalized itching as well--someone told her to be concerned about  cholestasis No chemicals or coloring anymore    Objective:   Physical Exam Constitutional:      Appearance: Normal appearance.  Skin:    Comments: No clear scalp rash No focal alopecia  Neurological:  Mental Status: She is alert.            Assessment & Plan:

## 2023-01-11 NOTE — Assessment & Plan Note (Addendum)
Could just be telogen effluvium--but she notes it over 3 months No clear rash Will give cortisone lotion for itching (increase to 2.5%) Check labs Continue medicated shampoos  Add MVI or just biotin--thinks she may take this sometimes

## 2023-01-16 ENCOUNTER — Ambulatory Visit (INDEPENDENT_AMBULATORY_CARE_PROVIDER_SITE_OTHER): Payer: Medicare HMO | Admitting: Internal Medicine

## 2023-01-16 ENCOUNTER — Encounter: Payer: Self-pay | Admitting: Internal Medicine

## 2023-01-16 ENCOUNTER — Encounter: Payer: Self-pay | Admitting: *Deleted

## 2023-01-16 VITALS — BP 138/86 | HR 74 | Temp 97.8°F | Ht 63.0 in | Wt 99.5 lb

## 2023-01-16 DIAGNOSIS — L299 Pruritus, unspecified: Secondary | ICD-10-CM | POA: Diagnosis not present

## 2023-01-16 MED ORDER — HYDROXYZINE HCL 10 MG PO TABS: 10.0000 mg | ORAL_TABLET | Freq: Three times a day (TID) | ORAL | 1 refills | Status: DC | PRN

## 2023-01-16 NOTE — Assessment & Plan Note (Signed)
With secondary bleeding in skin Likely related to anxiety LFTs normal last week Will try hydroxyzine 10mg  tid prn Needs to go back to the dermatologist

## 2023-01-16 NOTE — Progress Notes (Signed)
Subjective:    Patient ID: Lindsey Ward, female    DOB: Mar 17, 1950, 72 y.o.   MRN: [id number]  HPI Here due to itching  Still losing hair Now concerned about severe itching--"about to drive me crazy"  Has rash--- redness in various spots  Current Outpatient Medications on File Prior to Visit  Medication Sig Dispense Refill   ALPRAZolam (XANAX) 1 MG tablet Take 1 tablet (1 mg total) by mouth 3 (three) times daily as needed. 90 tablet 0   HYDROcodone-acetaminophen (NORCO/VICODIN) 5-325 MG tablet Take 1 tablet by mouth 3 (three) times daily as needed. Dx Code M79.7 90 tablet 0   hydrocortisone 2.5 % cream Apply topically 2 (two) times daily.     hydrocortisone 2.5 % lotion Apply topically 2 (two) times daily. 118 mL 1   Menthol, Topical Analgesic, (BIOFREEZE EX) Apply 1 application  topically as needed.     omeprazole (PRILOSEC) 20 MG capsule TAKE 1 CAPSULE BY MOUTH EVERY DAY 90 capsule 3   Polyethyl Glycol-Propyl Glycol (SYSTANE OP) Apply to eye as needed.     promethazine (PHENERGAN) 12.5 MG tablet Take 1-2 tablets (12.5-25 mg total) by mouth 3 (three) times daily as needed for nausea or vomiting. 60 tablet 0   SUMAtriptan (IMITREX) 50 MG tablet TAKE 0.5-1 TABLET BY MOUTH DAILY AS NEEDED FOR MIGRAINE. 9 tablet 11   triamcinolone cream (KENALOG) 0.1 % Apply 1 Application topically 2 (two) times daily. 30 g 0   No current facility-administered medications on file prior to visit.    Allergies  Allergen Reactions   Aspirin Other (See Comments)    REACTION: GI bleed   Hydralazine Other (See Comments)    hallucinations   Ibuprofen Other (See Comments)    REACTION: GI bleed   Tizanidine Other (See Comments)    "throws me in a different world"   Buspirone Hcl     REACTION: unspecified   Ciprofloxacin     REACTION: unspecified   Citalopram Other (See Comments)    Ear ringing.    Codeine    Diazepam    Duloxetine     REACTION: sz like activity   Imipramine Hcl Other (See  Comments)    Ear ringing.    Oxycodone-Acetaminophen     REACTION: unspecified   Propoxyphene Hcl    Sulfonamide Derivatives     REACTION: unspecified   Tramadol Hcl Other (See Comments)    REACTION: doesn't remember what happened but couldn't tolerate    Venlafaxine     REACTION: unspecified   Methocarbamol Rash   Pregabalin Other (See Comments)    REACTION: kept her up and confusion   Risperidone Other (See Comments)    REACTION: confusion   Savella [Milnacipran Hcl] Palpitations and Other (See Comments)    Dizziness, Body wouldn't move, SOB, etc    Past Medical History:  Diagnosis Date   Bowel obstruction (HCC)    Chronic fatigue    Depression    Fibromyalgia    GERD (gastroesophageal reflux disease)    Hyperlipemia    Migraine    Obstipation    Osteopenia    PUD (peptic ulcer disease)     Past Surgical History:  Procedure Laterality Date   ABDOMINAL SURGERY     APPENDECTOMY  2005   BREAST BIOPSY Bilateral 1999   rt w/out clip - neg, lt w/clip - neg   BREAST CYST ASPIRATION Right 2003   neg   CHOLECYSTECTOMY  2004   COLONOSCOPY WITH PROPOFOL  N/A 06/09/2016   Procedure: COLONOSCOPY WITH PROPOFOL;  Surgeon: Wyline Mood, MD;  Location: [facility] ENDOSCOPY;  Service: Endoscopy;  Laterality: N/A;   ESOPHAGOGASTRODUODENOSCOPY (EGD) WITH PROPOFOL N/A 06/09/2016   Procedure: ESOPHAGOGASTRODUODENOSCOPY (EGD) WITH PROPOFOL;  Surgeon: Wyline Mood, MD;  Location: [facility] ENDOSCOPY;  Service: Endoscopy;  Laterality: N/A;   EYE SURGERY     GIVENS CAPSULE STUDY N/A 06/23/2016   Procedure: GIVENS CAPSULE STUDY;  Surgeon: Wyline Mood, MD;  Location: [facility] ENDOSCOPY;  Service: Endoscopy;  Laterality: N/A;   PARS PLANA VITRECTOMY W/ REPAIR OF MACULAR HOLE Left 01/2017   [facility]   TOTAL ABDOMINAL HYSTERECTOMY W/ BILATERAL SALPINGOOPHORECTOMY  1986    Family History  Problem Relation Age of Onset   Heart disease Mother        AMI   Stroke Mother    Heart disease Father    Hypertension  Sister    Breast cancer Sister    Hypertension Sister    Heart disease Sister        MI   Diabetes Sister    Arthritis/Rheumatoid Sister    COPD Sister    Kidney cancer Neg Hx    Kidney disease Neg Hx    Prostate cancer Neg Hx     Social History   Socioeconomic History   Marital status: Divorced    Spouse name: Not on file   Number of children: 1   Years of education: Not on file   Highest education level: Not on file  Occupational History   Occupation: DISABLED  Tobacco Use   Smoking status: Former    Current packs/day: 0.00    Average packs/day: 1 pack/day for 40.0 years (40.0 ttl pk-yrs)    Types: Cigarettes    Start date: 09/25/1973    Quit date: 09/25/2013    Years since quitting: 9.3    Passive exposure: Never   Smokeless tobacco: Never   Tobacco comments:       Vaping Use   Vaping status: Never Used  Substance and Sexual Activity   Alcohol use: Not Currently    Alcohol/week: 0.0 standard drinks of alcohol    Comment: rarely   Drug use: No   Sexual activity: Not on file  Other Topics Concern   Not on file  Social History Narrative   Living alone again      No living will   Daughter should make health care decisions   Requests DNR---done 10/24/14   No tube feeds if cognitively unaware   Highest level of education: 9th   Social Determinants of Health   Financial Resource Strain: Not on file  Food Insecurity: No Food Insecurity (10/24/2022)   Hunger Vital Sign    Worried About Running Out of Food in the Last Year: Never true    Ran Out of Food in the Last Year: Never true  Transportation Needs: Not on file  Physical Activity: Not on file  Stress: Not on file  Social Connections: Not on file  Intimate Partner Violence: Not on file   Review of Systems Uses the xanax at bedtime still     Objective:   Physical Exam Skin:    Comments: Purpuric areas on upper chest, low back and arms/forearms            Assessment & Plan:

## 2023-01-18 DIAGNOSIS — L2989 Other pruritus: Secondary | ICD-10-CM | POA: Diagnosis not present

## 2023-01-24 ENCOUNTER — Telehealth: Payer: Self-pay | Admitting: Internal Medicine

## 2023-01-24 NOTE — Telephone Encounter (Signed)
Lindsey Ward with access nurse said triage disposition is to be seen within 4 hours. No available appts at [facility] or [facility]. Pt said started taking hydroxyzine on 01/16/23 and stopped taking on 01/23/23. Starting on 11/-11/24 pt feeling heart racing and palpitations on and off and whole body is swelling. Pt said the swelling is better today but pts cheeks are red and  tingling now. Pt said having palpitations on and off this morning and pt cannot remember last time had palpitations. Pt said she is not having trouble breathing but her nose feels stuffy. No swelling in neck throat,mouth or tongue no CP or dizziness. Pt said she does not feel normal. Pt is going to [employer]. Sending ntoe to Dr Alphonsus Sias and [facility] Sias pool.

## 2023-01-24 NOTE — Telephone Encounter (Signed)
Since we didn't have anything today--it makes sense for her to go to urgent care to get checked today

## 2023-01-24 NOTE — Telephone Encounter (Signed)
FYI: This call has been transferred to Access Nurse. Once the result note has been entered staff can address the message at that time.  Patient called in with the following symptoms:  Red Word: SOB   Please advise at Home [phone]  Message is routed to Provider Pool and [facility] Triage Patient called in stating that she thinks that medicationhydrOXYzine (ATARAX) 10 MG tablet   caused her whole body to be swollen when she woke up yesterday morning.She said that the swelling has gone down today,however she is having some shortness of breathe.

## 2023-01-25 ENCOUNTER — Other Ambulatory Visit: Payer: Self-pay | Admitting: Internal Medicine

## 2023-01-25 MED ORDER — ALPRAZOLAM 1 MG PO TABS: 1.0000 mg | ORAL_TABLET | Freq: Three times a day (TID) | ORAL | 0 refills | Status: DC | PRN

## 2023-01-25 MED ORDER — HYDROCODONE-ACETAMINOPHEN 5-325 MG PO TABS: 1.0000 | ORAL_TABLET | Freq: Three times a day (TID) | ORAL | 0 refills | Status: DC | PRN

## 2023-01-25 NOTE — Telephone Encounter (Signed)
Prescription Request  01/25/2023  LOV: 01/16/2023  What is the name of the medication or equipment? ALPRAZolam (XANAX) 1 MG tablet  HYDROcodone-acetaminophen (NORCO/VICODIN) 5-325 MG tablet   Have you contacted your pharmacy to request a refill? No   Which pharmacy would you like this sent to?  [id number] [address] [place] [address] Phone: [phone] Fax: [phone]    Patient notified that their request is being sent to the clinical staff for review and that they should receive a response within 2 business days.   Please advise at Mobile [phone] (mobile)

## 2023-01-30 ENCOUNTER — Other Ambulatory Visit: Payer: Self-pay | Admitting: Internal Medicine

## 2023-02-07 ENCOUNTER — Other Ambulatory Visit: Payer: Self-pay

## 2023-02-07 ENCOUNTER — Emergency Department
Admission: EM | Admit: 2023-02-07 | Discharge: 2023-02-08 | Disposition: A | Discharge status: Home / Self Care | Payer: Medicare HMO | Attending: Emergency Medicine | Admitting: Emergency Medicine

## 2023-02-07 DIAGNOSIS — L299 Pruritus, unspecified: Secondary | ICD-10-CM | POA: Insufficient documentation

## 2023-02-07 DIAGNOSIS — R21 Rash and other nonspecific skin eruption: Secondary | ICD-10-CM | POA: Diagnosis not present

## 2023-02-07 LAB — CBC WITH DIFFERENTIAL/PLATELET
Abs Immature Granulocytes: 0.02 10*3/uL (ref 0.00–0.07)
Basophils Absolute: 0.1 10*3/uL (ref 0.0–0.1)
Basophils Relative: 1 %
Eosinophils Absolute: 0.4 10*3/uL (ref 0.0–0.5)
Eosinophils Relative: 6 %
HCT: 33.6 % — ABNORMAL LOW (ref 36.0–46.0)
Hemoglobin: 11.3 g/dL — ABNORMAL LOW (ref 12.0–15.0)
Immature Granulocytes: 0 %
Lymphocytes Relative: 31 %
Lymphs Abs: 2 10*3/uL (ref 0.7–4.0)
MCH: 31 pg (ref 26.0–34.0)
MCHC: 33.6 g/dL (ref 30.0–36.0)
MCV: 92.3 fL (ref 80.0–100.0)
Monocytes Absolute: 0.5 10*3/uL (ref 0.1–1.0)
Monocytes Relative: 8 %
Neutro Abs: 3.4 10*3/uL (ref 1.7–7.7)
Neutrophils Relative %: 54 %
Platelets: 314 10*3/uL (ref 150–400)
RBC: 3.64 MIL/uL — ABNORMAL LOW (ref 3.87–5.11)
RDW: 11.9 % (ref 11.5–15.5)
WBC: 6.3 10*3/uL (ref 4.0–10.5)
nRBC: 0 % (ref 0.0–0.2)

## 2023-02-07 LAB — COMPREHENSIVE METABOLIC PANEL
ALT: 16 U/L (ref 0–44)
AST: 30 U/L (ref 15–41)
Albumin: 3.9 g/dL (ref 3.5–5.0)
Alkaline Phosphatase: 72 U/L (ref 38–126)
Anion gap: 4 — ABNORMAL LOW (ref 5–15)
BUN: 15 mg/dL (ref 8–23)
CO2: 28 mmol/L (ref 22–32)
Calcium: 9 mg/dL (ref 8.9–10.3)
Chloride: 103 mmol/L (ref 98–111)
Creatinine, Ser: 0.64 mg/dL (ref 0.44–1.00)
GFR, Estimated: >60 mL/min (ref >60)
Glucose, Bld: 123 mg/dL — ABNORMAL HIGH (ref 70–99)
Potassium: 3.6 mmol/L (ref 3.5–5.1)
Sodium: 135 mmol/L (ref 135–145)
Total Bilirubin: 0.4 mg/dL (ref <1.2)
Total Protein: 6.6 g/dL (ref 6.5–8.1)

## 2023-02-07 NOTE — ED Provider Notes (Incomplete)
[facility] Provider Note    Event Date/Time   First MD Initiated Contact with Patient 02/07/23 2309     (approximate)   History   Rash   HPI {Remember to add pertinent medical, surgical, social, and/or OB history to HPI:1} Lindsey Ward is a 72 y.o. female  ***       Physical Exam   Triage Vital Signs: ED Triage Vitals  Encounter Vitals Group     BP 02/07/23 2141 125/70     Systolic BP Percentile --      Diastolic BP Percentile --      Pulse Rate 02/07/23 2141 62     Resp 02/07/23 2141 19     Temp 02/07/23 2141 97.6 F (36.4 C)     Temp src --      SpO2 02/07/23 2141 97 %     Weight 02/07/23 2143 98 lb (44.5 kg)     Height 02/07/23 2143 5\' 3"  (1.6 m)     Head Circumference --      Peak Flow --      Pain Score 02/07/23 2143 0     Pain Loc --      Pain Education --      Exclude from Growth Chart --     Most recent vital signs: Vitals:   02/07/23 2141  BP: 125/70  Pulse: 62  Resp: 19  Temp: 97.6 F (36.4 C)  SpO2: 97%    {Only need to document appropriate and relevant physical exam:1} General: Awake, no distress. *** CV:  Good peripheral perfusion. *** Resp:  Normal effort. *** Abd:  No distention. *** Other:  ***   ED Results / Procedures / Treatments   Labs (all labs ordered are listed, but only abnormal results are displayed) Labs Reviewed  CBC WITH DIFFERENTIAL/PLATELET - Abnormal; Notable for the following components:      Result Value   RBC 3.64 (*)    Hemoglobin 11.3 (*)    HCT 33.6 (*)    All other components within normal limits  COMPREHENSIVE METABOLIC PANEL - Abnormal; Notable for the following components:   Glucose, Bld 123 (*)    Anion gap 4 (*)    All other components within normal limits     EKG  ***   RADIOLOGY *** {USE THE WORD "INTERPRETED"!! You MUST document your own interpretation of imaging, as well as the fact that you reviewed the radiologist's  report!:1}   PROCEDURES:  Critical Care performed: Yes  CRITICAL CARE Performed by: Phineas Semen   Total critical care time: *** minutes  Critical care time was exclusive of separately billable procedures and treating other patients.  Critical care was necessary to treat or prevent imminent or life-threatening deterioration.  Critical care was time spent personally by me on the following activities: development of treatment plan with patient and/or surrogate as well as nursing, discussions with consultants, evaluation of patient's response to treatment, examination of patient, obtaining history from patient or surrogate, ordering and performing treatments and interventions, ordering and review of laboratory studies, ordering and review of radiographic studies, pulse oximetry and re-evaluation of patient's condition.   Procedures    MEDICATIONS ORDERED IN ED: Medications - No data to display   IMPRESSION / MDM / ASSESSMENT AND PLAN / ED COURSE  I reviewed the triage vital signs and the nursing notes.  Differential diagnosis includes, but is not limited to, ***  Patient's presentation is most consistent with {EM COPA:27473}   ***The patient is on the cardiac monitor to evaluate for evidence of arrhythmia and/or significant heart rate changes.  ***      FINAL CLINICAL IMPRESSION(S) / ED DIAGNOSES   Final diagnoses:  None     Rx / DC Orders   ED Discharge Orders     None        Note:  This document was prepared using Dragon voice recognition software and may include unintentional dictation errors.

## 2023-02-07 NOTE — ED Triage Notes (Addendum)
Pt reports she developed intermittent rash that developed earlier this week, pt states rash is itchy, bruising noted to area of "rash" pt denies bleeding problems or other accompanying symptoms. Pt denies taking blood thinners

## 2023-02-07 NOTE — ED Provider Notes (Signed)
[facility] Provider Note    Event Date/Time   First MD Initiated Contact with Patient 02/07/23 2309     (approximate)   History   Rash   HPI  Lindsey Ward is a 72 y.o. female who presents to the emergency department today because of concerns for rash and itchiness.  The symptoms have been present for months.  She states she has been to multiple dermatologist as well as following up with her primary care doctor.  She states she came into the emergency department today because it just has not been getting better.  It does not appear there are any new symptoms today.     Physical Exam   Triage Vital Signs: ED Triage Vitals  Encounter Vitals Group     BP 02/07/23 2141 125/70     Systolic BP Percentile --      Diastolic BP Percentile --      Pulse Rate 02/07/23 2141 62     Resp 02/07/23 2141 19     Temp 02/07/23 2141 97.6 F (36.4 C)     Temp src --      SpO2 02/07/23 2141 97 %     Weight 02/07/23 2143 98 lb (44.5 kg)     Height 02/07/23 2143 5\' 3"  (1.6 m)     Head Circumference --      Peak Flow --      Pain Score 02/07/23 2143 0     Pain Loc --      Pain Education --      Exclude from Growth Chart --     Most recent vital signs: Vitals:   02/07/23 2141  BP: 125/70  Pulse: 62  Resp: 19  Temp: 97.6 F (36.4 C)  SpO2: 97%   General: Awake, alert, oriented. CV:  Good peripheral perfusion.  Resp:  Normal effort.  Abd:  No distention.  Other:  Diffuse ecchymosis to extremities and trunk.   ED Results / Procedures / Treatments   Labs (all labs ordered are listed, but only abnormal results are displayed) Labs Reviewed  CBC WITH DIFFERENTIAL/PLATELET - Abnormal; Notable for the following components:      Result Value   RBC 3.64 (*)    Hemoglobin 11.3 (*)    HCT 33.6 (*)    All other components within normal limits  COMPREHENSIVE METABOLIC PANEL - Abnormal; Notable for the following components:   Glucose, Bld 123 (*)    Anion  gap 4 (*)    All other components within normal limits     EKG  None   RADIOLOGY None   PROCEDURES:  Critical Care performed: No    MEDICATIONS ORDERED IN ED: Medications - No data to display   IMPRESSION / MDM / ASSESSMENT AND PLAN / ED COURSE  I reviewed the triage vital signs and the nursing notes.                              Differential diagnosis includes, but is not limited to, thrombocytopenia, anxiety, rash  Patient's presentation is most consistent with acute presentation with potential threat to life or bodily function.   Patient presented to the emergency department today because of concerns for continued rash that has been present for months.  On exam she does have diffuse ecchymosis.  She is itching.  She states she has Atarax at home.  Blood work without any concerning abnormalities.  Patient has had coags tested at previous visits.  At this time unclear etiology but I think given that has been going on for months it is reasonable for patient to continue follow-up workup with dermatology.      FINAL CLINICAL IMPRESSION(S) / ED DIAGNOSES   Final diagnoses:  Pruritus     Note:  This document was prepared using Dragon voice recognition software and may include unintentional dictation errors.    Phineas Semen, MD 02/08/23 [phone]

## 2023-02-08 ENCOUNTER — Ambulatory Visit: Admission: EM | Admit: 2023-02-08 | Discharge: 2023-02-08 | Payer: Medicare HMO

## 2023-02-14 ENCOUNTER — Encounter: Payer: Self-pay | Admitting: Internal Medicine

## 2023-02-14 MED ORDER — MONTELUKAST SODIUM 10 MG PO TABS: 10.0000 mg | ORAL_TABLET | Freq: Every day | ORAL | 3 refills | Status: DC

## 2023-02-15 DIAGNOSIS — H35373 Puckering of macula, bilateral: Secondary | ICD-10-CM | POA: Diagnosis not present

## 2023-02-28 ENCOUNTER — Encounter: Payer: Self-pay | Admitting: Internal Medicine

## 2023-02-28 ENCOUNTER — Ambulatory Visit (INDEPENDENT_AMBULATORY_CARE_PROVIDER_SITE_OTHER): Payer: Medicare HMO | Admitting: Internal Medicine

## 2023-02-28 VITALS — BP 110/70 | HR 57 | Temp 97.5°F | Ht 63.0 in | Wt 99.4 lb

## 2023-02-28 DIAGNOSIS — L299 Pruritus, unspecified: Secondary | ICD-10-CM | POA: Diagnosis not present

## 2023-02-28 MED ORDER — HYDROXYZINE HCL 10 MG PO TABS: 10.0000 mg | ORAL_TABLET | Freq: Three times a day (TID) | ORAL | 5 refills | Status: DC | PRN

## 2023-02-28 NOTE — Assessment & Plan Note (Signed)
Did have improvement with montelukast--though had to change it to AM dosing due to it keeping her up Will refill the hydroxyzine for ongoing prn use  The combination seems to be helping No depression issues with the hydroxyzine

## 2023-02-28 NOTE — Progress Notes (Signed)
Subjective:    Patient ID: Lindsey Ward, female    DOB: November 17, 1950, 72 y.o.   MRN: [id number]  HPI Here due to persistent itching  Started montelukast 2 weeks ago--noticed improvement right away Seemed to recur 3 days ago Also lost a lot of hair---thin in the front Has been to 5 dermatologists--no answers Actually got worse with clobetasol lotion from Dr Darla Lesches the hydroxyzine---thinks she ran out  Current Outpatient Medications on File Prior to Visit  Medication Sig Dispense Refill   ALPRAZolam (XANAX) 1 MG tablet Take 1 tablet (1 mg total) by mouth 3 (three) times daily as needed. 90 tablet 0   HYDROcodone-acetaminophen (NORCO/VICODIN) 5-325 MG tablet Take 1 tablet by mouth 3 (three) times daily as needed. Dx Code M79.7 90 tablet 0   hydrocortisone 2.5 % cream Apply topically 2 (two) times daily.     hydrocortisone 2.5 % lotion Apply topically 2 (two) times daily. 118 mL 1   Menthol, Topical Analgesic, (BIOFREEZE EX) Apply 1 application  topically as needed.     montelukast (SINGULAIR) 10 MG tablet Take 1 tablet (10 mg total) by mouth at bedtime. 90 tablet 3   omeprazole (PRILOSEC) 20 MG capsule TAKE 1 CAPSULE BY MOUTH EVERY DAY 90 capsule 3   Polyethyl Glycol-Propyl Glycol (SYSTANE OP) Apply to eye as needed.     promethazine (PHENERGAN) 12.5 MG tablet Take 1-2 tablets (12.5-25 mg total) by mouth 3 (three) times daily as needed for nausea or vomiting. 60 tablet 0   SUMAtriptan (IMITREX) 50 MG tablet TAKE 0.5-1 TABLET BY MOUTH DAILY AS NEEDED FOR MIGRAINE. 9 tablet 11   triamcinolone cream (KENALOG) 0.1 % Apply 1 Application topically 2 (two) times daily. 30 g 0   hydrOXYzine (ATARAX) 10 MG tablet Take 1 tablet (10 mg total) by mouth 3 (three) times daily as needed for itching. (Patient not taking: Reported on 02/28/2023) 60 tablet 1   No current facility-administered medications on file prior to visit.    Allergies  Allergen Reactions   Aspirin Other (See Comments)     REACTION: GI bleed   Hydralazine Other (See Comments)    hallucinations   Ibuprofen Other (See Comments)    REACTION: GI bleed   Tizanidine Other (See Comments)    "throws me in a different world"   Buspirone Hcl     REACTION: unspecified   Ciprofloxacin     REACTION: unspecified   Citalopram Other (See Comments)    Ear ringing.    Codeine    Diazepam    Duloxetine     REACTION: sz like activity   Imipramine Hcl Other (See Comments)    Ear ringing.    Oxycodone-Acetaminophen     REACTION: unspecified   Propoxyphene Hcl    Sulfonamide Derivatives     REACTION: unspecified   Tramadol Hcl Other (See Comments)    REACTION: doesn't remember what happened but couldn't tolerate    Venlafaxine     REACTION: unspecified   Methocarbamol Rash   Pregabalin Other (See Comments)    REACTION: kept her up and confusion   Risperidone Other (See Comments)    REACTION: confusion   Savella [Milnacipran Hcl] Palpitations and Other (See Comments)    Dizziness, Body wouldn't move, SOB, etc    Past Medical History:  Diagnosis Date   Bowel obstruction (HCC)    Chronic fatigue    Depression    Fibromyalgia    GERD (gastroesophageal reflux disease)  Hyperlipemia    Migraine    Obstipation    Osteopenia    PUD (peptic ulcer disease)     Past Surgical History:  Procedure Laterality Date   ABDOMINAL SURGERY     APPENDECTOMY  2005   BREAST BIOPSY Bilateral 1999   rt w/out clip - neg, lt w/clip - neg   BREAST CYST ASPIRATION Right 2003   neg   CHOLECYSTECTOMY  2004   COLONOSCOPY WITH PROPOFOL N/A 06/09/2016   Procedure: COLONOSCOPY WITH PROPOFOL;  Surgeon: Wyline Mood, MD;  Location: [facility];  Service: Endoscopy;  Laterality: N/A;   ESOPHAGOGASTRODUODENOSCOPY (EGD) WITH PROPOFOL N/A 06/09/2016   Procedure: ESOPHAGOGASTRODUODENOSCOPY (EGD) WITH PROPOFOL;  Surgeon: Wyline Mood, MD;  Location: [facility];  Service: Endoscopy;  Laterality: N/A;   EYE SURGERY     GIVENS  CAPSULE STUDY N/A 06/23/2016   Procedure: GIVENS CAPSULE STUDY;  Surgeon: Wyline Mood, MD;  Location: [facility];  Service: Endoscopy;  Laterality: N/A;   PARS PLANA VITRECTOMY W/ REPAIR OF MACULAR HOLE Left 01/2017   [facility]   TOTAL ABDOMINAL HYSTERECTOMY W/ BILATERAL SALPINGOOPHORECTOMY  1986    Family History  Problem Relation Age of Onset   Heart disease Mother        AMI   Stroke Mother    Heart disease Father    Hypertension Sister    Breast cancer Sister    Hypertension Sister    Heart disease Sister        MI   Diabetes Sister    Arthritis/Rheumatoid Sister    COPD Sister    Kidney cancer Neg Hx    Kidney disease Neg Hx    Prostate cancer Neg Hx     Social History   Socioeconomic History   Marital status: Divorced    Spouse name: Not on file   Number of children: 1   Years of education: Not on file   Highest education level: Not on file  Occupational History   Occupation: DISABLED  Tobacco Use   Smoking status: Former    Current packs/day: 0.00    Average packs/day: 1 pack/day for 40.0 years (40.0 ttl pk-yrs)    Types: Cigarettes    Start date: 09/25/1973    Quit date: 09/25/2013    Years since quitting: 9.4    Passive exposure: Never   Smokeless tobacco: Never   Tobacco comments:       Vaping Use   Vaping status: Never Used  Substance and Sexual Activity   Alcohol use: Not Currently    Alcohol/week: 0.0 standard drinks of alcohol    Comment: rarely   Drug use: No   Sexual activity: Not on file  Other Topics Concern   Not on file  Social History Narrative   Living alone again      No living will   Daughter should make health care decisions   Requests DNR---done 10/24/14   No tube feeds if cognitively unaware   Highest level of education: 9th   Social Drivers of Health   Financial Resource Strain: Not on file  Food Insecurity: No Food Insecurity (10/24/2022)   Hunger Vital Sign    Worried About Running Out of Food in the Last Year: Never true     Ran Out of Food in the Last Year: Never true  Transportation Needs: Not on file  Physical Activity: Not on file  Stress: Not on file  Social Connections: Not on file  Intimate Partner Violence: Not on file  Review of Systems No changes or increase in depression Not staying in bed all day Still not much appetite--weight is holding    Objective:   Physical Exam Constitutional:      Appearance: Normal appearance.  Skin:    Comments: No scalp lesions No focal alopecia  Neurological:     Mental Status: She is alert.            Assessment & Plan:

## 2023-03-02 ENCOUNTER — Telehealth: Payer: Self-pay

## 2023-03-02 NOTE — Telephone Encounter (Signed)
Called and spoke to pt. Advised her that she cannot take more than 10mg  daily of the montelukast. But, she can take Zyrtec, Claritin, or Allegra opposite of it. So montelukast in the morning and Zyrtec in the evening. She said she cannot tolerate the hydroxyzine

## 2023-03-02 NOTE — Telephone Encounter (Signed)
Copied from CRM [id number]. Topic: Clinical - Medication Refill >> Mar 02, 2023 12:14 PM Corin V wrote: Most Recent Primary Care Visit:  Provider: Tillman Abide I  Department: [facility]  Visit Type: OFFICE VISIT  Date: 02/28/2023  Medication: montelukast (SINGULAIR) 10 MG tablet  Has the patient contacted their pharmacy? Yes, patient spoke to Dr. Alphonsus Sias about taking 2 pills instead of 1 since the 10mg  was not strong enough. Patient has been feeling better taking the 20mg  dose and will need a new script sent into the pharmacy.  (Agent: If no, request that the patient contact the pharmacy for the refill. If patient does not wish to contact the pharmacy document the reason why and proceed with request.) (Agent: If yes, when and what did the pharmacy advise?)  Is this the correct pharmacy for this prescription? Yes If no, delete pharmacy and type the correct one.  This is the patient's preferred pharmacy:  [id number] - [address] [place] [address] Phone: [phone] Fax: [phone]   Has the prescription been filled recently? No  Is the patient out of the medication? No  Has the patient been seen for an appointment in the last year OR does the patient have an upcoming appointment? Yes  Can we respond through MyChart? Yes  Agent: Please be advised that Rx refills may take up to 3 business days. We ask that you follow-up with your pharmacy.

## 2023-04-06 ENCOUNTER — Other Ambulatory Visit: Payer: Self-pay | Admitting: Internal Medicine

## 2023-04-06 MED ORDER — ALPRAZOLAM 1 MG PO TABS: 1.0000 mg | ORAL_TABLET | Freq: Three times a day (TID) | ORAL | 0 refills | Status: DC | PRN

## 2023-04-06 MED ORDER — HYDROCODONE-ACETAMINOPHEN 5-325 MG PO TABS: 1.0000 | ORAL_TABLET | Freq: Three times a day (TID) | ORAL | 0 refills | Status: DC | PRN

## 2023-04-06 NOTE — Telephone Encounter (Signed)
Copied from CRM [id number]. Topic: Clinical - Medication Refill >> Apr 06, 2023  2:18 PM Deaijah H wrote: Most Recent Primary Care Visit:  Provider: Tillman Abide I  Department: [facility]  Visit Type: OFFICE VISIT  Date: 02/28/2023  Medication: HYDROcodone-acetaminophen (NORCO/VICODIN) 5-325 MG tablet/ALPRAZolam (XANAX) 1 MG tablet  Has the patient contacted their pharmacy? No (Agent: If no, request that the patient contact the pharmacy for the refill. If patient does not wish to contact the pharmacy document the reason why and proceed with request.) - No, due to being told to call Dr. Alphonsus Sias every time (Agent: If yes, when and what did the pharmacy advise?)  Is this the correct pharmacy for this prescription? Yes If no, delete pharmacy and type the correct one.  This is the patient's preferred pharmacy:  [id number] - [address] [place] [address] Phone: [phone] Fax: [phone]   Has the prescription been filled recently? No  Is the patient out of the medication? No  Has the patient been seen for an appointment in the last year OR does the patient have an upcoming appointment? Yes  Can we respond through MyChart? Yes  Agent: Please be advised that Rx refills may take up to 3 business days. We ask that you follow-up with your pharmacy.

## 2023-04-06 NOTE — Telephone Encounter (Signed)
Last Fill: Alprazolam: 01/25/23     Hydrocodone: 01/25/23  Last OV: 02/28/23 Next OV: 05/29/23  Routing to provider for review/authorization.

## 2023-04-29 ENCOUNTER — Encounter: Payer: Self-pay | Admitting: Oncology

## 2023-05-15 ENCOUNTER — Encounter: Payer: Self-pay | Admitting: Oncology

## 2023-05-23 DIAGNOSIS — H524 Presbyopia: Secondary | ICD-10-CM | POA: Diagnosis not present

## 2023-05-23 DIAGNOSIS — H02052 Trichiasis without entropian right lower eyelid: Secondary | ICD-10-CM | POA: Diagnosis not present

## 2023-05-23 DIAGNOSIS — H35373 Puckering of macula, bilateral: Secondary | ICD-10-CM | POA: Diagnosis not present

## 2023-05-23 DIAGNOSIS — H02055 Trichiasis without entropian left lower eyelid: Secondary | ICD-10-CM | POA: Diagnosis not present

## 2023-05-29 ENCOUNTER — Ambulatory Visit (INDEPENDENT_AMBULATORY_CARE_PROVIDER_SITE_OTHER): Payer: Medicare Other | Admitting: Internal Medicine

## 2023-05-29 ENCOUNTER — Encounter: Payer: Self-pay | Admitting: Internal Medicine

## 2023-05-29 VITALS — BP 104/60 | HR 60 | Ht 63.0 in | Wt 102.0 lb

## 2023-05-29 DIAGNOSIS — M797 Fibromyalgia: Secondary | ICD-10-CM

## 2023-05-29 DIAGNOSIS — R52 Pain, unspecified: Secondary | ICD-10-CM

## 2023-05-29 DIAGNOSIS — L219 Seborrheic dermatitis, unspecified: Secondary | ICD-10-CM | POA: Insufficient documentation

## 2023-05-29 DIAGNOSIS — F1120 Opioid dependence, uncomplicated: Secondary | ICD-10-CM

## 2023-05-29 DIAGNOSIS — G8929 Other chronic pain: Secondary | ICD-10-CM

## 2023-05-29 MED ORDER — ALPRAZOLAM 1 MG PO TABS: 1.0000 mg | ORAL_TABLET | Freq: Three times a day (TID) | ORAL | 0 refills | Status: DC | PRN

## 2023-05-29 MED ORDER — HYDROCODONE-ACETAMINOPHEN 5-325 MG PO TABS: 1.0000 | ORAL_TABLET | Freq: Three times a day (TID) | ORAL | 0 refills | Status: DC | PRN

## 2023-05-29 NOTE — Addendum Note (Signed)
 Addended by: Tillman Abide I on: 05/29/2023 10:47 AM   Modules accepted: Orders

## 2023-05-29 NOTE — Progress Notes (Signed)
 Subjective:    Patient ID: Lindsey Ward, female    DOB: 01-24-1951, 73 y.o.   MRN: [id number]  HPI Here for follow up of chronic pain issues  Uses the hydrocodone 1-2 per day Back did get helped by injections by pain specialist---only occasional lately Occasionally has trouble getting out of bed---limited housework She does grocery shopping--but then is wiped out  Also has crawling feeling in the top of her head Gets bumpy and sore at times----sensitive to touch Also red dots on forehead --come and go with the top of head thing  Current Outpatient Medications on File Prior to Visit  Medication Sig Dispense Refill   ALPRAZolam (XANAX) 1 MG tablet Take 1 tablet (1 mg total) by mouth 3 (three) times daily as needed. 90 tablet 0   carboxymethylcellulose (REFRESH PLUS) 0.5 % SOLN 1 drop 3 (three) times daily as needed.     HYDROcodone-acetaminophen (NORCO/VICODIN) 5-325 MG tablet Take 1 tablet by mouth 3 (three) times daily as needed. Dx Code M79.7 90 tablet 0   hydrocortisone 2.5 % cream Apply topically 2 (two) times daily.     hydrocortisone 2.5 % lotion Apply topically 2 (two) times daily. 118 mL 1   hydrOXYzine (ATARAX) 10 MG tablet Take 1 tablet (10 mg total) by mouth 3 (three) times daily as needed for itching. 60 tablet 5   Menthol, Topical Analgesic, (BIOFREEZE EX) Apply 1 application  topically as needed.     montelukast (SINGULAIR) 10 MG tablet Take 1 tablet (10 mg total) by mouth at bedtime. 90 tablet 3   Multiple Vitamins-Minerals (VISION VITAMINS PO) Take by mouth.     omeprazole (PRILOSEC) 20 MG capsule TAKE 1 CAPSULE BY MOUTH EVERY DAY 90 capsule 3   Polyethyl Glycol-Propyl Glycol (SYSTANE OP) Apply to eye as needed.     promethazine (PHENERGAN) 12.5 MG tablet Take 1-2 tablets (12.5-25 mg total) by mouth 3 (three) times daily as needed for nausea or vomiting. 60 tablet 0   SUMAtriptan (IMITREX) 50 MG tablet TAKE 0.5-1 TABLET BY MOUTH DAILY AS NEEDED FOR MIGRAINE. 9 tablet  11   triamcinolone cream (KENALOG) 0.1 % Apply 1 Application topically 2 (two) times daily. 30 g 0   No current facility-administered medications on file prior to visit.    Allergies  Allergen Reactions   Aspirin Other (See Comments)    REACTION: GI bleed   Hydralazine Other (See Comments)    hallucinations   Ibuprofen Other (See Comments)    REACTION: GI bleed   Tizanidine Other (See Comments)    "throws me in a different world"   Buspirone Hcl     REACTION: unspecified   Ciprofloxacin     REACTION: unspecified   Citalopram Other (See Comments)    Ear ringing.    Codeine    Diazepam    Duloxetine     REACTION: sz like activity   Imipramine Hcl Other (See Comments)    Ear ringing.    Oxycodone-Acetaminophen     REACTION: unspecified   Propoxyphene Hcl    Sulfonamide Derivatives     REACTION: unspecified   Tramadol Hcl Other (See Comments)    REACTION: doesn't remember what happened but couldn't tolerate    Venlafaxine     REACTION: unspecified   Methocarbamol Rash   Pregabalin Other (See Comments)    REACTION: kept her up and confusion   Risperidone Other (See Comments)    REACTION: confusion   Savella [Milnacipran Hcl] Palpitations and Other (  See Comments)    Dizziness, Body wouldn't move, SOB, etc    Past Medical History:  Diagnosis Date   Bowel obstruction (HCC)    Chronic fatigue    Depression    Fibromyalgia    GERD (gastroesophageal reflux disease)    Hyperlipemia    Migraine    Obstipation    Osteopenia    PUD (peptic ulcer disease)     Past Surgical History:  Procedure Laterality Date   ABDOMINAL SURGERY     APPENDECTOMY  2005   BREAST BIOPSY Bilateral 1999   rt w/out clip - neg, lt w/clip - neg   BREAST CYST ASPIRATION Right 2003   neg   CHOLECYSTECTOMY  2004   COLONOSCOPY WITH PROPOFOL N/A 06/09/2016   Procedure: COLONOSCOPY WITH PROPOFOL;  Surgeon: Wyline Mood, MD;  Location: [facility];  Service: Endoscopy;  Laterality: N/A;    ESOPHAGOGASTRODUODENOSCOPY (EGD) WITH PROPOFOL N/A 06/09/2016   Procedure: ESOPHAGOGASTRODUODENOSCOPY (EGD) WITH PROPOFOL;  Surgeon: Wyline Mood, MD;  Location: [facility];  Service: Endoscopy;  Laterality: N/A;   EYE SURGERY     GIVENS CAPSULE STUDY N/A 06/23/2016   Procedure: GIVENS CAPSULE STUDY;  Surgeon: Wyline Mood, MD;  Location: [facility];  Service: Endoscopy;  Laterality: N/A;   PARS PLANA VITRECTOMY W/ REPAIR OF MACULAR HOLE Left 01/2017   [facility]   TOTAL ABDOMINAL HYSTERECTOMY W/ BILATERAL SALPINGOOPHORECTOMY  1986    Family History  Problem Relation Age of Onset   Heart disease Mother        AMI   Stroke Mother    Heart disease Father    Hypertension Sister    Breast cancer Sister    Hypertension Sister    Heart disease Sister        MI   Diabetes Sister    Arthritis/Rheumatoid Sister    COPD Sister    Kidney cancer Neg Hx    Kidney disease Neg Hx    Prostate cancer Neg Hx     Social History   Socioeconomic History   Marital status: Divorced    Spouse name: Not on file   Number of children: 1   Years of education: Not on file   Highest education level: Not on file  Occupational History   Occupation: DISABLED  Tobacco Use   Smoking status: Former    Current packs/day: 0.00    Average packs/day: 1 pack/day for 40.0 years (40.0 ttl pk-yrs)    Types: Cigarettes    Start date: 09/25/1973    Quit date: 09/25/2013    Years since quitting: 9.6    Passive exposure: Never   Smokeless tobacco: Never   Tobacco comments:       Vaping Use   Vaping status: Never Used  Substance and Sexual Activity   Alcohol use: Not Currently    Alcohol/week: 0.0 standard drinks of alcohol    Comment: rarely   Drug use: No   Sexual activity: Not on file  Other Topics Concern   Not on file  Social History Narrative   Living alone again      No living will   Daughter should make health care decisions   Requests DNR---done 10/24/14   No tube feeds if cognitively unaware    Highest level of education: 9th   Social Drivers of Health   Financial Resource Strain: Not on file  Food Insecurity: No Food Insecurity (10/24/2022)   Hunger Vital Sign    Worried About Running Out of Food in the  Last Year: Never true    Ran Out of Food in the Last Year: Never true  Transportation Needs: Not on file  Physical Activity: Not on file  Stress: Not on file  Social Connections: Not on file  Intimate Partner Violence: Not on file   Review of Systems Sleeps okay--but pain back when she gets up Appetite still poor--but weight up somewhat      Objective:   Physical Exam Constitutional:      Appearance: Normal appearance.  HENT:     Head:     Comments: No scalp lesions Musculoskeletal:     Comments: No obvious synovitis  Neurological:     Mental Status: She is alert.            Assessment & Plan:

## 2023-05-29 NOTE — Assessment & Plan Note (Signed)
 PDMP reviewed No concerns

## 2023-05-29 NOTE — Assessment & Plan Note (Signed)
 Main source of chronic pain now Has failed most every medication I have tried Uses the hydrocodone 1-2 per day

## 2023-05-29 NOTE — Assessment & Plan Note (Signed)
 Scalp/forehead lesions seem to be from this--uses the products that dermatologist gave (when seen for itching)

## 2023-05-29 NOTE — Assessment & Plan Note (Signed)
 Will refer back to Dr Laban Emperor if her back gets worse

## 2023-06-01 ENCOUNTER — Telehealth: Payer: Self-pay

## 2023-06-01 DIAGNOSIS — H35373 Puckering of macula, bilateral: Secondary | ICD-10-CM | POA: Diagnosis not present

## 2023-06-01 NOTE — Telephone Encounter (Signed)
 Copied from CRM [id number]. Topic: General - Other >> Jun 01, 2023  8:37 AM Florestine Avers wrote: Reason for CRM: Patient called in stating that Dr. Alphonsus Sias was going to send her to the pain clinic, but she has already been to them prior and they did not do anything for her.Patient states that she does not want to see a pain specialist and for Dr. Vassie Moselle nurse to give her a call between the hours of 9am-12pm, or please call after 3pm due to her having a dental appointment today.

## 2023-06-01 NOTE — Telephone Encounter (Signed)
 Left message on Vm with Dr Karle Starch note

## 2023-06-06 NOTE — Telephone Encounter (Signed)
 noted

## 2023-06-06 NOTE — Telephone Encounter (Signed)
 Copied from CRM [id number]. Topic: General - Other >> Jun 06, 2023  9:17 AM Elizebeth Brooking wrote: Reason for CRM: Patient called in returning a call, relay message that Dr.Letvak she stating she doesn't want to go back because he doesn't do anything for her.  [phone]

## 2023-06-11 ENCOUNTER — Other Ambulatory Visit: Payer: Self-pay | Admitting: Internal Medicine

## 2023-06-11 DIAGNOSIS — K219 Gastro-esophageal reflux disease without esophagitis: Secondary | ICD-10-CM

## 2023-06-12 ENCOUNTER — Telehealth: Payer: Self-pay | Admitting: Internal Medicine

## 2023-06-12 NOTE — Telephone Encounter (Signed)
 Okay---I will not refer her back then

## 2023-06-12 NOTE — Telephone Encounter (Addendum)
 Spoke to pt. She said she has done pain management. They said there is nothing they can do to help her.

## 2023-06-12 NOTE — Telephone Encounter (Signed)
 Copied from CRM [id number]. Topic: General - Other >> Jun 12, 2023  9:52 AM Tiffany S wrote: Reason for CRM: Patient is requesting call back from [facility] regarding pain management Please follow up with patient

## 2023-06-25 ENCOUNTER — Encounter: Payer: Self-pay | Admitting: Oncology

## 2023-06-26 ENCOUNTER — Other Ambulatory Visit: Payer: Self-pay | Admitting: Internal Medicine

## 2023-06-26 ENCOUNTER — Encounter: Payer: Self-pay | Admitting: Oncology

## 2023-07-10 ENCOUNTER — Telehealth: Payer: Self-pay

## 2023-07-10 DIAGNOSIS — R413 Other amnesia: Secondary | ICD-10-CM

## 2023-07-10 NOTE — Telephone Encounter (Addendum)
 Says she has bumps on her head. They are itchy. No pain from them. Hurts to touch scalp in other places.   Loses her balance.   When she is driving, she gets lost.   Does not think she needs dermatology.

## 2023-07-10 NOTE — Addendum Note (Signed)
 Addended by: Curt Dover I on: 07/10/2023 12:46 PM   Modules accepted: Orders

## 2023-07-10 NOTE — Telephone Encounter (Signed)
 Let her know I sent the referral--not sure how long it will take

## 2023-07-10 NOTE — Telephone Encounter (Signed)
 Copied from CRM [id number]. Topic: Referral - Request for Referral >> Jul 10, 2023  9:13 AM Clydene Darner H wrote: Did the patient discuss referral with their provider in the last year? Yes (If No - schedule appointment) (If Yes - send message)  Appointment offered? No Patient called the office inquiring about where she would like to be referred. She was informed that she will need to contact her PCP to initiate the referral request.  Type of order/referral and detailed reason for visit: Neurology  Patient stated that her PCP is aware of her condition and she would like to be referred to the neurologist she requested.  Preference of office, provider, location: Hemang K. Mason Sole, MD [facility] - Neurology [address] [place], [place] [address] Office: [phone]  Fax: [phone]  If referral order, have you been seen by this specialty before? Yes (If Yes, this issue or another issue? When? Where?  Can we respond through MyChart? No

## 2023-07-11 NOTE — Telephone Encounter (Signed)
 Spoke to pt

## 2023-07-18 ENCOUNTER — Ambulatory Visit (INDEPENDENT_AMBULATORY_CARE_PROVIDER_SITE_OTHER)

## 2023-07-18 VITALS — BP 104/60 | Ht 63.0 in | Wt 100.0 lb

## 2023-07-18 DIAGNOSIS — Z0000 Encounter for general adult medical examination without abnormal findings: Secondary | ICD-10-CM

## 2023-07-18 DIAGNOSIS — Z2821 Immunization not carried out because of patient refusal: Secondary | ICD-10-CM

## 2023-07-18 DIAGNOSIS — Z5320 Procedure and treatment not carried out because of patient's decision for unspecified reasons: Secondary | ICD-10-CM | POA: Diagnosis not present

## 2023-07-18 NOTE — Progress Notes (Signed)
 Because this visit was a virtual/telehealth visit,  certain criteria was not obtained, such a blood pressure, CBG if applicable, and timed get up and go. Any medications not marked as "taking" were not mentioned during the medication reconciliation part of the visit. Any vitals not documented were not able to be obtained due to this being a telehealth visit or patient was unable to self-report a recent blood pressure reading due to a lack of equipment at home via telehealth. Vitals that have been documented are verbally provided by the patient.  Subjective:   Lindsey Ward is a 73 y.o. who presents for a Medicare Wellness preventive visit.  Visit Complete: Virtual I connected with  Lindsey Ward on 07/18/23 by a audio enabled telemedicine application and verified that I am speaking with the correct person using two identifiers.  Patient Location: Home  Provider Location: Home Office  I discussed the limitations of evaluation and management by telemedicine. The patient expressed understanding and agreed to proceed.  Vital Signs: Because this visit was a virtual/telehealth visit, some criteria may be missing or patient reported. Any vitals not documented were not able to be obtained and vitals that have been documented are patient reported.  VideoDeclined- This patient declined Librarian, academic. Therefore the visit was completed with audio only.  Persons Participating in Visit: Patient.  AWV Questionnaire: No: Patient Medicare AWV questionnaire was not completed prior to this visit.  Cardiac Risk Factors include: advanced age (>18men, >60 women);dyslipidemia;hypertension     Objective:    Today's Vitals   07/18/23 1432  BP: 104/60  Weight: 100 lb (45.4 kg)  Height: 5\' 3"  (1.6 m)   Body mass index is 17.71 kg/m.     07/18/2023    2:31 PM 02/07/2023    9:44 PM 10/25/2022   12:56 PM 10/15/2022   10:29 PM 12/18/2020    6:02 PM 11/22/2018    3:10 PM  03/18/2018    1:04 PM  Advanced Directives  Does Patient Have a Medical Advance Directive? Yes No No No No No No  Type of Estate agent of [place];Living will        Does patient want to make changes to medical advance directive? No - Patient declined        Copy of Healthcare Power of Attorney in Chart? No - copy requested        Would patient like information on creating a medical advance directive?      No - Patient declined No - Patient declined    Current Medications (verified) Outpatient Encounter Medications as of 07/18/2023  Medication Sig   ALPRAZolam  (XANAX ) 1 MG tablet Take 1 tablet (1 mg total) by mouth 3 (three) times daily as needed.   carboxymethylcellulose (REFRESH PLUS) 0.5 % SOLN 1 drop 3 (three) times daily as needed.   HYDROcodone -acetaminophen  (NORCO/VICODIN) 5-325 MG tablet Take 1 tablet by mouth 3 (three) times daily as needed. Dx Code M79.7   hydrocortisone  2.5 % cream Apply topically 2 (two) times daily.   hydrocortisone  2.5 % lotion Apply topically 2 (two) times daily.   hydrOXYzine  (ATARAX ) 10 MG tablet Take 1 tablet (10 mg total) by mouth 3 (three) times daily as needed for itching.   Menthol, Topical Analgesic, (BIOFREEZE EX) Apply 1 application  topically as needed.   montelukast  (SINGULAIR ) 10 MG tablet TAKE 1 TABLET BY MOUTH EVERYDAY AT BEDTIME   Multiple Vitamins-Minerals (VISION VITAMINS PO) Take by mouth.   omeprazole  (PRILOSEC)  20 MG capsule TAKE 1 CAPSULE BY MOUTH EVERY DAY   Polyethyl Glycol-Propyl Glycol (SYSTANE OP) Apply to eye as needed.   promethazine  (PHENERGAN ) 12.5 MG tablet Take 1-2 tablets (12.5-25 mg total) by mouth 3 (three) times daily as needed for nausea or vomiting.   SUMAtriptan  (IMITREX ) 50 MG tablet TAKE 0.5-1 TABLET BY MOUTH DAILY AS NEEDED FOR MIGRAINE.   triamcinolone  cream (KENALOG ) 0.1 % Apply 1 Application topically 2 (two) times daily.   No facility-administered encounter medications on file as of 07/18/2023.     Allergies (verified) Aspirin, Hydralazine, Ibuprofen, Tizanidine, Buspirone hcl, Ciprofloxacin, Citalopram , Codeine, Diazepam, Duloxetine, Imipramine  hcl, Oxycodone-acetaminophen , Propoxyphene hcl, Sulfonamide derivatives, Tramadol hcl, Venlafaxine, Methocarbamol , Pregabalin, Risperidone, and Savella  [milnacipran  hcl]   History: Past Medical History:  Diagnosis Date   Bowel obstruction (HCC)    Chronic fatigue    Depression    Fibromyalgia    GERD (gastroesophageal reflux disease)    Hyperlipemia    Migraine    Obstipation    Osteopenia    PUD (peptic ulcer disease)    Past Surgical History:  Procedure Laterality Date   ABDOMINAL SURGERY     APPENDECTOMY  2005   BREAST BIOPSY Bilateral 1999   rt w/out clip - neg, lt w/clip - neg   BREAST CYST ASPIRATION Right 2003   neg   CHOLECYSTECTOMY  2004   COLONOSCOPY WITH PROPOFOL  N/A 06/09/2016   Procedure: COLONOSCOPY WITH PROPOFOL ;  Surgeon: Luke Salaam, MD;  Location: [facility] ENDOSCOPY;  Service: Endoscopy;  Laterality: N/A;   ESOPHAGOGASTRODUODENOSCOPY (EGD) WITH PROPOFOL  N/A 06/09/2016   Procedure: ESOPHAGOGASTRODUODENOSCOPY (EGD) WITH PROPOFOL ;  Surgeon: Luke Salaam, MD;  Location: [facility] ENDOSCOPY;  Service: Endoscopy;  Laterality: N/A;   EYE SURGERY     GIVENS CAPSULE STUDY N/A 06/23/2016   Procedure: GIVENS CAPSULE STUDY;  Surgeon: Luke Salaam, MD;  Location: [facility] ENDOSCOPY;  Service: Endoscopy;  Laterality: N/A;   PARS PLANA VITRECTOMY W/ REPAIR OF MACULAR HOLE Left 01/2017   [facility]   TOTAL ABDOMINAL HYSTERECTOMY W/ BILATERAL SALPINGOOPHORECTOMY  1986   Family History  Problem Relation Age of Onset   Heart disease Mother        AMI   Stroke Mother    Heart disease Father    Hypertension Sister    Breast cancer Sister    Hypertension Sister    Heart disease Sister        MI   Diabetes Sister    Arthritis/Rheumatoid Sister    COPD Sister    Kidney cancer Neg Hx    Kidney disease Neg Hx    Prostate cancer Neg Hx     Social History   Socioeconomic History   Marital status: Divorced    Spouse name: Not on file   Number of children: 1   Years of education: Not on file   Highest education level: Not on file  Occupational History   Occupation: DISABLED  Tobacco Use   Smoking status: Former    Current packs/day: 0.00    Average packs/day: 1 pack/day for 40.0 years (40.0 ttl pk-yrs)    Types: Cigarettes    Start date: 09/25/1973    Quit date: 09/25/2013    Years since quitting: 9.8    Passive exposure: Never   Smokeless tobacco: Never   Tobacco comments:       Vaping Use   Vaping status: Never Used  Substance and Sexual Activity   Alcohol use: Not Currently    Alcohol/week: 0.0 standard drinks of  alcohol    Comment: rarely   Drug use: No   Sexual activity: Not on file  Other Topics Concern   Not on file  Social History Narrative   Living alone again      No living will   Daughter should make health care decisions   Requests DNR---done 10/24/14   No tube feeds if cognitively unaware   Highest level of education: 9th   Social Drivers of Health   Financial Resource Strain: Low Risk  (07/18/2023)   Overall Financial Resource Strain (CARDIA)    Difficulty of Paying Living Expenses: Not hard at all  Food Insecurity: No Food Insecurity (07/18/2023)   Hunger Vital Sign    Worried About Running Out of Food in the Last Year: Never true    Ran Out of Food in the Last Year: Never true  Transportation Needs: No Transportation Needs (07/18/2023)   PRAPARE - Administrator, Civil Service (Medical): No    Lack of Transportation (Non-Medical): No  Physical Activity: Inactive (07/18/2023)   Exercise Vital Sign    Days of Exercise per Week: 0 days    Minutes of Exercise per Session: 10 min  Stress: No Stress Concern Present (07/18/2023)   [employer] of Occupational Health - Occupational Stress Questionnaire    Feeling of Stress : Not at all  Social Connections: Moderately Isolated  (07/18/2023)   Social Connection and Isolation Panel [NHANES]    Frequency of Communication with Friends and Family: More than three times a week    Frequency of Social Gatherings with Friends and Family: Twice a week    Attends Religious Services: Never    Database administrator or Organizations: Yes    Attends Banker Meetings: Never    Marital Status: Widowed    Tobacco Counseling Counseling given: Not Answered Tobacco comments:      Clinical Intake:  Pre-visit preparation completed: Yes  Pain : 0-10 Pain Type: Chronic pain Pain Location: Hip Pain Orientation: Right, Left Pain Descriptors / Indicators: Aching Pain Onset: Today Pain Frequency: Intermittent     BMI - recorded: 17.71 Nutritional Status: BMI <19  Underweight Nutritional Risks: None Diabetes: No  No results found for: "HGBA1C"   How often do you need to have someone help you when you read instructions, pamphlets, or other written materials from your doctor or pharmacy?: 1 - Never What is the last grade level you completed in school?: 9th grade     Information entered by :: Juliann Ochoa   Activities of Daily Living     07/18/2023    2:37 PM  In your present state of health, do you have any difficulty performing the following activities:  Hearing? 0  Vision? 0  Difficulty concentrating or making decisions? 0  Walking or climbing stairs? 0  Dressing or bathing? 0  Doing errands, shopping? 0  Preparing Food and eating ? N  Using the Toilet? N  In the past six months, have you accidently leaked urine? N  Do you have problems with loss of bowel control? N  Managing your Medications? N  Managing your Finances? N  Housekeeping or managing your Housekeeping? N    Patient Care Team: Helaine Llanos, MD as PCP - General Constancia Delton, MD as PCP - Cardiology (Cardiology)  Indicate any recent Medical Services you may have received from other than [facility] providers in the past  year (date may be approximate).     Assessment:  This is a routine wellness examination for Lindsey Ward.  Hearing/Vision screen Hearing Screening - Comments:: No hearing difficulties Vision Screening - Comments:: No vision issues   Goals Addressed             This Visit's Progress    Patient Stated       Patient would like to get better        Depression Screen     07/18/2023    2:39 PM 10/25/2022   12:56 PM 08/17/2022    3:24 PM 04/21/2022    3:03 PM 04/16/2020    2:52 PM 03/22/2018    3:57 PM 03/20/2017    3:55 PM  PHQ 2/9 Scores  PHQ - 2 Score 2 0  6     PHQ- 9 Score 4   15     Exception Documentation   Patient refusal  Medical reason Medical reason Medical reason    Fall Risk     07/18/2023    2:36 PM 10/25/2022   12:56 PM 10/11/2022    9:06 AM 08/17/2022    3:24 PM 06/15/2022   10:17 AM  Fall Risk   Falls in the past year? 0 0 0 0 0  Number falls in past yr: 0   0   Injury with Fall? 0   0   Risk for fall due to : No Fall Risks   No Fall Risks   Follow up Falls prevention discussed;Falls evaluation completed   Falls evaluation completed     MEDICARE RISK AT HOME:  Medicare Risk at Home Any stairs in or around the home?: Yes If so, are there any without handrails?: No Home free of loose throw rugs in walkways, pet beds, electrical cords, etc?: Yes Adequate lighting in your home to reduce risk of falls?: Yes Life alert?: No Use of a cane, walker or w/c?: No Grab bars in the bathroom?: Yes Shower chair or bench in shower?: No Elevated toilet seat or a handicapped toilet?: No  TIMED UP AND GO:  Was the test performed?  No  Cognitive Function: 6CIT completed        07/18/2023    2:35 PM  6CIT Screen  What Year? 0 points  What month? 0 points  What time? 0 points  Count back from 20 0 points  Months in reverse 0 points  Repeat phrase 0 points  Total Score 0 points    Immunizations Immunization History  Administered Date(s) Administered   Fluad Quad(high  Dose 65+) 01/11/2019, 11/09/2020   Influenza Whole 01/11/2008   Influenza, Seasonal, Injecte, Preservative Fre 12/09/2012   Influenza,inj,Quad PF,6+ Mos 12/16/2013, 02/09/2015, 12/15/2015, 12/19/2016, 12/05/2017, 01/02/2020   Pneumococcal Conjugate-13 10/24/2014   Pneumococcal Polysaccharide-23 10/21/2013   Td 03/14/2005   Zoster, Live 09/13/2010    Screening Tests Health Maintenance  Topic Date Due   Zoster Vaccines- Shingrix (1 of 2) 08/27/1967   DTaP/Tdap/Td (2 - Tdap) 03/15/2015   Lung Cancer Screening  10/06/2018   MAMMOGRAM  08/13/2023   INFLUENZA VACCINE  10/13/2023   Medicare Annual Wellness (AWV)  07/17/2024   Colonoscopy  06/10/2026   Pneumonia Vaccine 45+ Years old  Completed   DEXA SCAN  Completed   Hepatitis C Screening  Completed   HPV VACCINES  Aged Out   Meningococcal B Vaccine  Aged Out   COVID-19 Vaccine  Discontinued    Health Maintenance  Health Maintenance Due  Topic Date Due   Zoster Vaccines- Shingrix (1 of 2) 08/27/1967  DTaP/Tdap/Td (2 - Tdap) 03/15/2015   Lung Cancer Screening  10/06/2018   Health Maintenance Items Addressed:patient declined lung cancer screening ang   Additional Screening:  Vision Screening: Recommended annual ophthalmology exams for early detection of glaucoma and other disorders of the eye.  Dental Screening: Recommended annual dental exams for proper oral hygiene  Community Resource Referral / Chronic Care Management: CRR required this visit?  No   CCM required this visit?  No     Plan:     I have personally reviewed and noted the following in the patient's chart:   Medical and social history Use of alcohol, tobacco or illicit drugs  Current medications and supplements including opioid prescriptions. Patient is not currently taking opioid prescriptions. Functional ability and status Nutritional status Physical activity Advanced directives List of other physicians Hospitalizations, surgeries, and ER visits  in previous 12 months Vitals Screenings to include cognitive, depression, and falls Referrals and appointments  In addition, I have reviewed and discussed with patient certain preventive protocols, quality metrics, and best practice recommendations. A written personalized care plan for preventive services as well as general preventive health recommendations were provided to patient.     Freeda Jerry, [place]   07/18/2023   After Visit Summary: (MyChart) Due to this being a telephonic visit, the after visit summary with patients personalized plan was offered to patient via MyChart   Notes: Nothing significant to report at this time.

## 2023-07-18 NOTE — Patient Instructions (Signed)
 Lindsey Ward , Thank you for taking time to come for your Medicare Wellness Visit. I appreciate your ongoing commitment to your health goals. Please review the following plan we discussed and let me know if I can assist you in the future.   Referrals/Orders/Follow-Ups/Clinician Recommendations: follow up as scheduled for next annual wellness visit.  This is a list of the screening recommended for you and due dates:  Health Maintenance  Topic Date Due   Zoster (Shingles) Vaccine (1 of 2) 08/27/1967   DTaP/Tdap/Td vaccine (2 - Tdap) 03/15/2015   Screening for Lung Cancer  10/06/2018   Mammogram  08/13/2023   Flu Shot  10/13/2023   Medicare Annual Wellness Visit  07/17/2024   Colon Cancer Screening  06/10/2026   Pneumonia Vaccine  Completed   DEXA scan (bone density measurement)  Completed   Hepatitis C Screening  Completed   HPV Vaccine  Aged Out   Meningitis B Vaccine  Aged Out   COVID-19 Vaccine  Discontinued    Advanced directives: (In Chart) A copy of your advanced directives are scanned into your chart should your provider ever need it.  Next Medicare Annual Wellness Visit scheduled for next year: Yes  Have you seen your provider in the last 6 months (3 months if uncontrolled diabetes)? Yes

## 2023-07-20 ENCOUNTER — Encounter: Payer: Self-pay | Admitting: Internal Medicine

## 2023-07-20 MED ORDER — HYDROCODONE-ACETAMINOPHEN 5-325 MG PO TABS: 1.0000 | ORAL_TABLET | Freq: Three times a day (TID) | ORAL | 0 refills | Status: DC | PRN

## 2023-07-20 MED ORDER — ALPRAZOLAM 1 MG PO TABS: 1.0000 mg | ORAL_TABLET | Freq: Three times a day (TID) | ORAL | 0 refills | Status: DC | PRN

## 2023-07-25 ENCOUNTER — Telehealth: Payer: Self-pay

## 2023-07-25 NOTE — Telephone Encounter (Signed)
 Received a fax from [facility] [address] [place] stating pt needs a PA for Hydrocodone 

## 2023-07-26 ENCOUNTER — Telehealth: Payer: Self-pay

## 2023-07-26 ENCOUNTER — Other Ambulatory Visit (HOSPITAL_COMMUNITY): Payer: Self-pay

## 2023-07-26 ENCOUNTER — Encounter: Payer: Self-pay | Admitting: Oncology

## 2023-07-26 NOTE — Telephone Encounter (Signed)
 Pharmacy Patient Advocate Encounter   Received notification from Physician's Office that prior authorization for Norco 5 is required/requested.   Insurance verification completed.   The patient is insured through [place] .   Per test claim: PA required; PA submitted to above mentioned insurance via CoverMyMeds Key/confirmation #/EOC [id number] Status is pending

## 2023-07-27 ENCOUNTER — Other Ambulatory Visit (HOSPITAL_COMMUNITY): Payer: Self-pay

## 2023-07-27 NOTE — Telephone Encounter (Signed)
 Pharmacy Patient Advocate Encounter  Received notification from [facility] that Prior Authorization for Norco 5 has been approved from 07/26/23 to 03/13/24. Unable to obtain patient co-pay due to patient DUR alprazolam  from 07/20/23   PA #/Case ID/Reference #: [id number]

## 2023-08-08 DIAGNOSIS — M797 Fibromyalgia: Secondary | ICD-10-CM | POA: Diagnosis not present

## 2023-08-08 DIAGNOSIS — R413 Other amnesia: Secondary | ICD-10-CM | POA: Diagnosis not present

## 2023-08-13 DIAGNOSIS — R413 Other amnesia: Secondary | ICD-10-CM | POA: Diagnosis not present

## 2023-08-29 ENCOUNTER — Encounter: Payer: Self-pay | Admitting: Internal Medicine

## 2023-08-29 ENCOUNTER — Ambulatory Visit (INDEPENDENT_AMBULATORY_CARE_PROVIDER_SITE_OTHER): Admitting: Internal Medicine

## 2023-08-29 VITALS — BP 130/78 | HR 61 | Temp 98.2°F | Ht 63.0 in | Wt 102.0 lb

## 2023-08-29 DIAGNOSIS — I471 Supraventricular tachycardia, unspecified: Secondary | ICD-10-CM | POA: Insufficient documentation

## 2023-08-29 DIAGNOSIS — M797 Fibromyalgia: Secondary | ICD-10-CM

## 2023-08-29 DIAGNOSIS — R0609 Other forms of dyspnea: Secondary | ICD-10-CM | POA: Insufficient documentation

## 2023-08-29 MED ORDER — HYDROCODONE-ACETAMINOPHEN 5-325 MG PO TABS: 1.0000 | ORAL_TABLET | Freq: Three times a day (TID) | ORAL | 0 refills | Status: DC | PRN

## 2023-08-29 NOTE — Assessment & Plan Note (Signed)
 Brief spells on monitor 2 years ago More symptoms but not clearly tachycardia Will ask Dr Junnie Olives to check her again

## 2023-08-29 NOTE — Progress Notes (Addendum)
 Subjective:    Patient ID: Lindsey Ward, female    DOB: 1951/02/09, 73 y.o.   MRN: [id number]  HPI Here due to heart fluttering and SOB  Feels her heart fluttering at times No real chest pain DOE---even walking short distances. This has worsened  Still hurts all over Legs, knuckles Needs to hold on to stay up at times Spends a lot of time in bed due to her pain Hydrocodone  not great--but at least it helps some  Has noticed knots on both sides of neck  Current Outpatient Medications on File Prior to Visit  Medication Sig Dispense Refill   ALPRAZolam  (XANAX ) 1 MG tablet Take 1 tablet (1 mg total) by mouth 3 (three) times daily as needed. 90 tablet 0   carboxymethylcellulose (REFRESH PLUS) 0.5 % SOLN 1 drop 3 (three) times daily as needed.     HYDROcodone -acetaminophen  (NORCO/VICODIN) 5-325 MG tablet Take 1 tablet by mouth 3 (three) times daily as needed. Dx Code M79.7 90 tablet 0   hydrocortisone  2.5 % cream Apply topically 2 (two) times daily.     hydrocortisone  2.5 % lotion Apply topically 2 (two) times daily. 118 mL 1   hydrOXYzine  (ATARAX ) 10 MG tablet Take 1 tablet (10 mg total) by mouth 3 (three) times daily as needed for itching. 60 tablet 5   Menthol, Topical Analgesic, (BIOFREEZE EX) Apply 1 application  topically as needed.     montelukast  (SINGULAIR ) 10 MG tablet TAKE 1 TABLET BY MOUTH EVERYDAY AT BEDTIME 90 tablet 3   Multiple Vitamins-Minerals (VISION VITAMINS PO) Take by mouth.     omeprazole  (PRILOSEC) 20 MG capsule TAKE 1 CAPSULE BY MOUTH EVERY DAY 90 capsule 3   Polyethyl Glycol-Propyl Glycol (SYSTANE OP) Apply to eye as needed.     promethazine  (PHENERGAN ) 12.5 MG tablet Take 1-2 tablets (12.5-25 mg total) by mouth 3 (three) times daily as needed for nausea or vomiting. 60 tablet 0   SUMAtriptan  (IMITREX ) 50 MG tablet TAKE 0.5-1 TABLET BY MOUTH DAILY AS NEEDED FOR MIGRAINE. 9 tablet 11   triamcinolone  cream (KENALOG ) 0.1 % Apply 1 Application topically 2 (two)  times daily. 30 g 0   No current facility-administered medications on file prior to visit.    Allergies  Allergen Reactions   Aspirin Other (See Comments)    REACTION: GI bleed   Hydralazine Other (See Comments)    hallucinations   Ibuprofen Other (See Comments)    REACTION: GI bleed   Tizanidine Other (See Comments)    throws me in a different world   Buspirone Hcl     REACTION: unspecified   Ciprofloxacin     REACTION: unspecified   Citalopram  Other (See Comments)    Ear ringing.    Codeine    Diazepam    Duloxetine     REACTION: sz like activity   Imipramine  Hcl Other (See Comments)    Ear ringing.    Oxycodone-Acetaminophen      REACTION: unspecified   Propoxyphene Hcl    Sulfonamide Derivatives     REACTION: unspecified   Tramadol Hcl Other (See Comments)    REACTION: doesn't remember what happened but couldn't tolerate    Venlafaxine     REACTION: unspecified   Methocarbamol  Rash   Pregabalin Other (See Comments)    REACTION: kept her up and confusion   Risperidone Other (See Comments)    REACTION: confusion   Savella  [Milnacipran  Hcl] Palpitations and Other (See Comments)    Dizziness, Body wouldn't  move, SOB, etc    Past Medical History:  Diagnosis Date   Bowel obstruction (HCC)    Chronic fatigue    Depression    Fibromyalgia    GERD (gastroesophageal reflux disease)    Hyperlipemia    Migraine    Obstipation    Osteopenia    PUD (peptic ulcer disease)     Past Surgical History:  Procedure Laterality Date   ABDOMINAL SURGERY     APPENDECTOMY  2005   BREAST BIOPSY Bilateral 1999   rt w/out clip - neg, lt w/clip - neg   BREAST CYST ASPIRATION Right 2003   neg   CHOLECYSTECTOMY  2004   COLONOSCOPY WITH PROPOFOL  N/A 06/09/2016   Procedure: COLONOSCOPY WITH PROPOFOL ;  Surgeon: Luke Salaam, MD;  Location: [facility];  Service: Endoscopy;  Laterality: N/A;   ESOPHAGOGASTRODUODENOSCOPY (EGD) WITH PROPOFOL  N/A 06/09/2016   Procedure:  ESOPHAGOGASTRODUODENOSCOPY (EGD) WITH PROPOFOL ;  Surgeon: Luke Salaam, MD;  Location: [facility];  Service: Endoscopy;  Laterality: N/A;   EYE SURGERY     GIVENS CAPSULE STUDY N/A 06/23/2016   Procedure: GIVENS CAPSULE STUDY;  Surgeon: Luke Salaam, MD;  Location: [facility];  Service: Endoscopy;  Laterality: N/A;   PARS PLANA VITRECTOMY W/ REPAIR OF MACULAR HOLE Left 01/2017   [facility]   TOTAL ABDOMINAL HYSTERECTOMY W/ BILATERAL SALPINGOOPHORECTOMY  1986    Family History  Problem Relation Age of Onset   Heart disease Mother        AMI   Stroke Mother    Heart disease Father    Hypertension Sister    Breast cancer Sister    Hypertension Sister    Heart disease Sister        MI   Diabetes Sister    Arthritis/Rheumatoid Sister    COPD Sister    Kidney cancer Neg Hx    Kidney disease Neg Hx    Prostate cancer Neg Hx     Social History   Socioeconomic History   Marital status: Divorced    Spouse name: Not on file   Number of children: 1   Years of education: Not on file   Highest education level: Not on file  Occupational History   Occupation: DISABLED  Tobacco Use   Smoking status: Former    Current packs/day: 0.00    Average packs/day: 1 pack/day for 40.0 years (40.0 ttl pk-yrs)    Types: Cigarettes    Start date: 09/25/1973    Quit date: 09/25/2013    Years since quitting: 9.9    Passive exposure: Never   Smokeless tobacco: Never   Tobacco comments:       Vaping Use   Vaping status: Never Used  Substance and Sexual Activity   Alcohol use: Not Currently    Alcohol/week: 0.0 standard drinks of alcohol    Comment: rarely   Drug use: No   Sexual activity: Not on file  Other Topics Concern   Not on file  Social History Narrative   Living alone again      No living will   Daughter should make health care decisions   Requests DNR---done 10/24/14   No tube feeds if cognitively unaware   Highest level of education: 9th   Social Drivers of Health   Financial  Resource Strain: Low Risk  (08/08/2023)   [facility]   Overall Financial Resource Strain (CARDIA)    Difficulty of Paying Living Expenses: Not hard at all  Food Insecurity:  No Food Insecurity (08/08/2023)   Received from [facility]   Hunger Vital Sign    Within the past 12 months, you worried that your food would run out before you got the money to buy more.: Never true    Within the past 12 months, the food you bought just didn't last and you didn't have money to get more.: Never true  Transportation Needs: No Transportation Needs (08/08/2023)   Received from [facility] - Transportation    In the past 12 months, has lack of transportation kept you from medical appointments or from getting medications?: No    Lack of Transportation (Non-Medical): No  Physical Activity: Inactive (07/18/2023)   Exercise Vital Sign    Days of Exercise per Week: 0 days    Minutes of Exercise per Session: 10 min  Stress: No Stress Concern Present (07/18/2023)   [employer] of Occupational Health - Occupational Stress Questionnaire    Feeling of Stress : Not at all  Social Connections: Moderately Isolated (07/18/2023)   Social Connection and Isolation Panel    Frequency of Communication with Friends and Family: More than three times a week    Frequency of Social Gatherings with Friends and Family: Twice a week    Attends Religious Services: Never    Database administrator or Organizations: Yes    Attends Banker Meetings: Never    Marital Status: Widowed  Intimate Partner Violence: Not At Risk (07/18/2023)   Humiliation, Afraid, Rape, and Kick questionnaire    Fear of Current or Ex-Partner: No    Emotionally Abused: No    Physically Abused: No    Sexually Abused: No   Review of Systems Ongoing depression----no worse then usual Anxiety is also about the same    Objective:   Physical Exam Neck:     Comments:  Tiny node on each side--reassured, not pathologic Cardiovascular:     Rate and Rhythm: Normal rate and regular rhythm.     Heart sounds: No murmur heard.    No gallop.   Musculoskeletal:     Cervical back: Neck supple.     Right lower leg: No edema.     Left lower leg: No edema.     Comments: No true synovitis---some nodules in DIPs in hands            Assessment & Plan:

## 2023-08-29 NOTE — Assessment & Plan Note (Signed)
 Has failed every medication I can think of Uses the hydrocodone  for some relief

## 2023-08-29 NOTE — Assessment & Plan Note (Signed)
 Worsening lately May need cardiac testing

## 2023-09-14 ENCOUNTER — Ambulatory Visit: Attending: Cardiology | Admitting: Cardiology

## 2023-09-14 ENCOUNTER — Encounter: Payer: Self-pay | Admitting: Cardiology

## 2023-09-14 VITALS — BP 104/60 | HR 58 | Ht 62.0 in | Wt 105.4 lb

## 2023-09-14 DIAGNOSIS — I2510 Atherosclerotic heart disease of native coronary artery without angina pectoris: Secondary | ICD-10-CM

## 2023-09-14 DIAGNOSIS — R002 Palpitations: Secondary | ICD-10-CM | POA: Diagnosis not present

## 2023-09-14 NOTE — Patient Instructions (Signed)

## 2023-09-14 NOTE — Progress Notes (Signed)
 Cardiology Office Note:    Date:  09/14/2023   ID:  [name], DOB 07-07-1950, MRN [id number]  PCP:  Jimmy Charlie FERNS, MD   [facility] Providers Cardiologist:  Redell Cave, MD     Referring MD: Jimmy Charlie FERNS, MD   Chief Complaint  Patient presents with   Palpitations    Patient states that she is feeling ok on today . Meds reviewed.     History of Present Illness:    Lindsey Ward is a 73 y.o. female with a hx of GERD, former smoker x25+ years, paroxysmal SVT, coronary calcification (LAD and left circumflex calcification on chest CT 2019 ), fibromyalgia, who presents for follow-up.    Has occasional palpitations, no dizziness, shortness of breath, presyncope or syncope.  Previous cardiac monitor showed occasional paroxysmal SVT, no sustained arrhythmias.  Baseline bradycardic.  Has several allergies and medication intolerances.  Prior notes/testing Cardiac monitor 11/2021 occasional paroxysmal SVT, rare PACs.  No A-fib or flutter, no sustained arrhythmias.   Past Medical History:  Diagnosis Date   Bowel obstruction (HCC)    Chronic fatigue    Depression    Fibromyalgia    GERD (gastroesophageal reflux disease)    Hyperlipemia    Migraine    Obstipation    Osteopenia    PUD (peptic ulcer disease)     Past Surgical History:  Procedure Laterality Date   ABDOMINAL SURGERY     APPENDECTOMY  2005   BREAST BIOPSY Bilateral 1999   rt w/out clip - neg, lt w/clip - neg   BREAST CYST ASPIRATION Right 2003   neg   CHOLECYSTECTOMY  2004   COLONOSCOPY WITH PROPOFOL  N/A 06/09/2016   Procedure: COLONOSCOPY WITH PROPOFOL ;  Surgeon: Ruel Kung, MD;  Location: [facility];  Service: Endoscopy;  Laterality: N/A;   ESOPHAGOGASTRODUODENOSCOPY (EGD) WITH PROPOFOL  N/A 06/09/2016   Procedure: ESOPHAGOGASTRODUODENOSCOPY (EGD) WITH PROPOFOL ;  Surgeon: Ruel Kung, MD;  Location: [facility];  Service: Endoscopy;  Laterality: N/A;   EYE SURGERY     GIVENS  CAPSULE STUDY N/A 06/23/2016   Procedure: GIVENS CAPSULE STUDY;  Surgeon: Ruel Kung, MD;  Location: [facility];  Service: Endoscopy;  Laterality: N/A;   PARS PLANA VITRECTOMY W/ REPAIR OF MACULAR HOLE Left 01/2017   [facility]   TOTAL ABDOMINAL HYSTERECTOMY W/ BILATERAL SALPINGOOPHORECTOMY  1986    Current Medications: Current Meds  Medication Sig   ALPRAZolam  (XANAX ) 1 MG tablet Take 1 tablet (1 mg total) by mouth 3 (three) times daily as needed.   carboxymethylcellulose (REFRESH PLUS) 0.5 % SOLN 1 drop 3 (three) times daily as needed.   HYDROcodone -acetaminophen  (NORCO/VICODIN) 5-325 MG tablet Take 1 tablet by mouth 3 (three) times daily as needed. Dx Code M79.7   hydrocortisone  2.5 % cream Apply topically 2 (two) times daily.   hydrocortisone  2.5 % lotion Apply topically 2 (two) times daily.   hydrOXYzine  (ATARAX ) 10 MG tablet Take 1 tablet (10 mg total) by mouth 3 (three) times daily as needed for itching.   Menthol, Topical Analgesic, (BIOFREEZE EX) Apply 1 application  topically as needed.   montelukast  (SINGULAIR ) 10 MG tablet TAKE 1 TABLET BY MOUTH EVERYDAY AT BEDTIME   Multiple Vitamins-Minerals (VISION VITAMINS PO) Take by mouth.   omeprazole  (PRILOSEC) 20 MG capsule TAKE 1 CAPSULE BY MOUTH EVERY DAY   Polyethyl Glycol-Propyl Glycol (SYSTANE OP) Apply to eye as needed.   promethazine  (PHENERGAN ) 12.5 MG tablet Take 1-2 tablets (12.5-25 mg total) by mouth 3 (three)  times daily as needed for nausea or vomiting.   SUMAtriptan  (IMITREX ) 50 MG tablet TAKE 0.5-1 TABLET BY MOUTH DAILY AS NEEDED FOR MIGRAINE.   triamcinolone  cream (KENALOG ) 0.1 % Apply 1 Application topically 2 (two) times daily.     Allergies:   Aspirin, Hydralazine, Ibuprofen, Tizanidine, Buspirone hcl, Ciprofloxacin, Citalopram , Codeine, Diazepam, Duloxetine, Imipramine  hcl, Oxycodone-acetaminophen , Propoxyphene hcl, Sulfonamide derivatives, Tramadol hcl, Venlafaxine, Methocarbamol , Pregabalin, Risperidone, and Savella   [milnacipran  hcl]   Social History   Socioeconomic History   Marital status: Divorced    Spouse name: Not on file   Number of children: 1   Years of education: Not on file   Highest education level: Not on file  Occupational History   Occupation: DISABLED  Tobacco Use   Smoking status: Former    Current packs/day: 0.00    Average packs/day: 1 pack/day for 40.0 years (40.0 ttl pk-yrs)    Types: Cigarettes    Start date: 09/25/1973    Quit date: 09/25/2013    Years since quitting: 9.9    Passive exposure: Never   Smokeless tobacco: Never   Tobacco comments:       Vaping Use   Vaping status: Never Used  Substance and Sexual Activity   Alcohol use: Not Currently    Alcohol/week: 0.0 standard drinks of alcohol    Comment: rarely   Drug use: No   Sexual activity: Not on file  Other Topics Concern   Not on file  Social History Narrative   Living alone again      No living will   Daughter should make health care decisions   Requests DNR---done 10/24/14   No tube feeds if cognitively unaware   Highest level of education: 9th   Social Drivers of Health   Financial Resource Strain: Low Risk  (08/08/2023)   [facility]   Overall Financial Resource Strain (CARDIA)    Difficulty of Paying Living Expenses: Not hard at all  Food Insecurity: No Food Insecurity (08/08/2023)   Received from [facility]   Hunger Vital Sign    Within the past 12 months, you worried that your food would run out before you got the money to buy more.: Never true    Within the past 12 months, the food you bought just didn't last and you didn't have money to get more.: Never true  Transportation Needs: No Transportation Needs (08/08/2023)   Received from [facility] - Transportation    In the past 12 months, has lack of transportation kept you from medical appointments or from getting medications?: No    Lack of Transportation  (Non-Medical): No  Physical Activity: Inactive (07/18/2023)   Exercise Vital Sign    Days of Exercise per Week: 0 days    Minutes of Exercise per Session: 10 min  Stress: No Stress Concern Present (07/18/2023)   [employer] of Occupational Health - Occupational Stress Questionnaire    Feeling of Stress : Not at all  Social Connections: Moderately Isolated (07/18/2023)   Social Connection and Isolation Panel    Frequency of Communication with Friends and Family: More than three times a week    Frequency of Social Gatherings with Friends and Family: Twice a week    Attends Religious Services: Never    Database administrator or Organizations: Yes    Attends Banker Meetings: Never    Marital Status: Widowed     Family History:  The patient's family history includes Arthritis/Rheumatoid in her sister; Breast cancer in her sister; COPD in her sister; Diabetes in her sister; Heart disease in her father, mother, and sister; Hypertension in her sister and sister; Stroke in her mother. There is no history of Kidney cancer, Kidney disease, or Prostate cancer.  ROS:   Please see the history of present illness.     All other systems reviewed and are negative.  EKGs/Labs/Other Studies Reviewed:    The following studies were reviewed today:   EKG Interpretation Date/Time:  Thursday September 14 2023 10:41:18 EDT Ventricular Rate:  58 PR Interval:  198 QRS Duration:  76 QT Interval:  410 QTC Calculation: 402 R Axis:   46  Text Interpretation: Sinus bradycardia Confirmed by Darliss Rogue ([id number]) on 09/14/2023 11:08:57 AM    Recent Labs: 01/11/2023: TSH 1.83 02/07/2023: ALT 16; BUN 15; Creatinine, Ser 0.64; Hemoglobin 11.3; Platelets 314; Potassium 3.6; Sodium 135  Recent Lipid Panel    Component Value Date/Time   CHOL 231 (H) 03/08/2016 1243   TRIG 129.0 03/08/2016 1243   HDL 42.40 03/08/2016 1243   CHOLHDL 5 03/08/2016 1243   VLDL 25.8 03/08/2016 1243   LDLCALC 162 (H)  03/08/2016 1243   LDLDIRECT 153.7 10/10/2012 1139     Risk Assessment/Calculations:         Physical Exam:    VS:  BP 104/60   Pulse (!) 58   Ht 5' 2 (1.575 m)   Wt 105 lb 6.4 oz (47.8 kg)   SpO2 97%   BMI 19.28 kg/m     Wt Readings from Last 3 Encounters:  09/14/23 105 lb 6.4 oz (47.8 kg)  08/29/23 102 lb (46.3 kg)  07/18/23 100 lb (45.4 kg)     GEN:  Well nourished, well developed in no acute distress HEENT: Normal NECK: No JVD; No carotid bruits CARDIAC: RRR, no murmurs, rubs, gallops RESPIRATORY:  Clear to auscultation without rales, wheezing or rhonchi  ABDOMEN: Soft, non-tender, non-distended MUSCULOSKELETAL:  No edema; varicose veins, nontender SKIN: Warm and dry NEUROLOGIC:  Alert and oriented x 3 PSYCHIATRIC:  Normal affect   ASSESSMENT:    1. Palpitations   2. Coronary artery calcification    PLAN:    In order of problems listed above:  Palpitations, cardiac monitor showed occasional paroxysmal SVT.  No significant sustained arrhythmias.  Due to several medication intolerances, and no significant arrhythmias, will avoid adding beta-blocker.  EKG shows sinus bradycardia, heart rate 58 today. LAD and left circumflex calcifications on chest CT in 2019, patient intolerant to several medications including aspirin.  Low-cholesterol diet emphasized.  Has baseline muscle aches from fibromyalgia.  Follow-up as needed.      Medication Adjustments/Labs and Tests Ordered: Current medicines are reviewed at length with the patient today.  Concerns regarding medicines are outlined above.  Orders Placed This Encounter  Procedures   EKG 12-Lead   No orders of the defined types were placed in this encounter.   Patient Instructions  Medication Instructions:  Your physician recommends that you continue on your current medications as directed. Please refer to the Current Medication list given to you today.   *If you need a refill on your cardiac medications  before your next appointment, please call your pharmacy*  Lab Work: No labs ordered today  If you have labs (blood work) drawn today and your tests are completely normal, you will receive your results only by: MyChart Message (if you have MyChart) OR A paper copy  in the mail If you have any lab test that is abnormal or we need to change your treatment, we will call you to review the results.  Testing/Procedures: No test ordered today   Follow-Up: At [facility], you and your health needs are our priority.  As part of our continuing mission to provide you with exceptional heart care, our providers are all part of one team.  This team includes your primary Cardiologist (physician) and Advanced Practice Providers or APPs (Physician Assistants and Nurse Practitioners) who all work together to provide you with the care you need, when you need it.  Your next appointment:   1 year(s)  Provider:   You may see Redell Cave, MD or one of the following Advanced Practice Providers on your designated Care Team:   Lonni Meager, NP Lesley Maffucci, PA-C Bernardino Bring, PA-C Cadence [place], PA-C Tylene Lunch, NP Barnie Hila, NP    We recommend signing up for the patient portal called MyChart.  Sign up information is provided on this After Visit Summary.  MyChart is used to connect with patients for Virtual Visits (Telemedicine).  Patients are able to view lab/test results, encounter notes, upcoming appointments, etc.  Non-urgent messages can be sent to your provider as well.   To learn more about what you can do with MyChart, go to [email or web address].   Signed, Redell Cave, MD  09/14/2023 12:34 PM    [facility]

## 2023-09-26 ENCOUNTER — Ambulatory Visit: Payer: Self-pay

## 2023-09-26 NOTE — Telephone Encounter (Signed)
 FYI Only or Action Required?: FYI only for provider.  Patient was last seen in primary care on 08/29/2023 by Lindsey Charlie FERNS, MD.  Called Nurse Triage reporting Shortness of Breath and Neck Pain.  Symptoms began several weeks ago.  Interventions attempted: Rest, hydration, or home remedies.  Symptoms are: unchanged.  Triage Disposition: See HCP Within 4 Hours (Or PCP Triage)-patient to Urgent Care to be evaluated today.   Patient/caregiver understands and will follow disposition?: Yes  Copied from CRM [id number]. Topic: Clinical - Red Word Triage >> Sep 26, 2023 12:54 PM Burnard DEL wrote: Red Word that prompted transfer to Nurse Triage: pain in neck running down back,tightness in chest Reason for Disposition  [1] MILD difficulty breathing (e.g., minimal/no SOB at rest, SOB with walking, pulse < 100) AND [2] NEW-onset or WORSE than normal  Answer Assessment - Initial Assessment Questions 1. RESPIRATORY STATUS: Describe your breathing? (e.g., wheezing, shortness of breath, unable to speak, severe coughing)      Shortness of breath 2. ONSET: When did this breathing problem begin?      On and off for three weeks 3. PATTERN Does the difficult breathing come and go, or has it been constant since it started?      Constant  4. SEVERITY: How bad is your breathing? (e.g., mild, moderate, severe)      mild 5. RECURRENT SYMPTOM: Have you had difficulty breathing before? If Yes, ask: When was the last time? and What happened that time?      yes 6. CARDIAC HISTORY: Do you have any history of heart disease? (e.g., heart attack, angina, bypass surgery, angioplasty)      SVT 7. LUNG HISTORY: Do you have any history of lung disease?  (e.g., pulmonary embolus, asthma, emphysema)     no 8. CAUSE: What do you think is causing the breathing problem?      unsure 9. OTHER SYMPTOMS: Do you have any other symptoms? (e.g., chest pain, cough, dizziness, fever, runny nose)     Tightness  in chest, pain at the back of neck that goes into back 10. O2 SATURATION MONITOR:  Do you use an oxygen saturation monitor (pulse oximeter) at home? If Yes, ask: What is your reading (oxygen level) today? What is your usual oxygen saturation reading? (e.g., 95%)       N/A 12. TRAVEL: Have you traveled out of the country in the last month? (e.g., travel history, exposures)       no  Protocols used: Breathing Difficulty-A-AH

## 2023-10-03 ENCOUNTER — Encounter: Admitting: Internal Medicine

## 2023-10-03 ENCOUNTER — Encounter: Payer: Self-pay | Admitting: Internal Medicine

## 2023-10-09 ENCOUNTER — Other Ambulatory Visit: Payer: Self-pay | Admitting: Internal Medicine

## 2023-10-09 MED ORDER — HYDROCODONE-ACETAMINOPHEN 5-325 MG PO TABS: 1.0000 | ORAL_TABLET | Freq: Three times a day (TID) | ORAL | 0 refills | Status: DC | PRN

## 2023-10-09 MED ORDER — ALPRAZOLAM 1 MG PO TABS: 1.0000 mg | ORAL_TABLET | Freq: Three times a day (TID) | ORAL | 0 refills | Status: DC | PRN

## 2023-10-09 NOTE — Telephone Encounter (Signed)
 Last written 08-29-23 #90 Last OV [id number] Next OV 10-19-23 [facility] Last UDS 04-16-20

## 2023-10-09 NOTE — Telephone Encounter (Signed)
 Copied from CRM [id number]. Topic: Clinical - Medication Refill >> Oct 09, 2023 10:10 AM [place] wrote: Medication: ALPRAZolam  (XANAX ) 1 MG tablet  Has the patient contacted their pharmacy? Yes (Agent: If no, request that the patient contact the pharmacy for the refill. If patient does not wish to contact the pharmacy document the reason why and proceed with request.) (Agent: If yes, when and what did the pharmacy advise?)  This is the patient's preferred pharmacy:  [id number] [address] [facility] - [name] [place] [address] Phone: [phone] Fax: [phone]  Is this the correct pharmacy for this prescription? Yes If no, delete pharmacy and type the correct one.   Has the prescription been filled recently? Yes  Is the patient out of the medication? Yes  Has the patient been seen for an appointment in the last year OR does the patient have an upcoming appointment? Yes  Can we respond through MyChart? Yes  Agent: Please be advised that Rx refills may take up to 3 business days. We ask that you follow-up with your pharmacy.

## 2023-10-09 NOTE — Telephone Encounter (Signed)
 Copied from CRM [id number]. Topic: Clinical - Medication Refill >> Oct 09, 2023 10:09 AM Laymon HERO wrote: Medication: HYDROcodone -acetaminophen  (NORCO/VICODIN) 5-325 MG tablet  Has the patient contacted their pharmacy? Yes (Agent: If no, request that the patient contact the pharmacy for the refill. If patient does not wish to contact the pharmacy document the reason why and proceed with request.) (Agent: If yes, when and what did the pharmacy advise?)  This is the patient's preferred pharmacy:  [id number] [address] [facility] - [address] [place] [address] Phone: [phone] Fax: [phone]    Is this the correct pharmacy for this prescription? Yes If no, delete pharmacy and type the correct one.   Has the prescription been filled recently? Yes  Is the patient out of the medication? Yes  Has the patient been seen for an appointment in the last year OR does the patient have an upcoming appointment? Yes  Can we respond through MyChart? Yes  Agent: Please be advised that Rx refills may take up to 3 business days. We ask that you follow-up with your pharmacy.

## 2023-10-19 ENCOUNTER — Ambulatory Visit: Admitting: Internal Medicine

## 2023-10-19 VITALS — BP 136/70 | HR 66 | Temp 97.8°F | Ht 62.0 in | Wt 104.0 lb

## 2023-10-19 DIAGNOSIS — R21 Rash and other nonspecific skin eruption: Secondary | ICD-10-CM

## 2023-10-19 DIAGNOSIS — Z0000 Encounter for general adult medical examination without abnormal findings: Secondary | ICD-10-CM | POA: Diagnosis not present

## 2023-10-19 DIAGNOSIS — F39 Unspecified mood [affective] disorder: Secondary | ICD-10-CM | POA: Diagnosis not present

## 2023-10-19 DIAGNOSIS — M797 Fibromyalgia: Secondary | ICD-10-CM | POA: Diagnosis not present

## 2023-10-19 DIAGNOSIS — I471 Supraventricular tachycardia, unspecified: Secondary | ICD-10-CM

## 2023-10-19 DIAGNOSIS — F1120 Opioid dependence, uncomplicated: Secondary | ICD-10-CM | POA: Diagnosis not present

## 2023-10-19 MED ORDER — TRIAMCINOLONE ACETONIDE 0.1 % EX CREA: 1.0000 | TOPICAL_CREAM | Freq: Two times a day (BID) | CUTANEOUS | 0 refills | Status: AC

## 2023-10-19 NOTE — Assessment & Plan Note (Signed)
 Chronic anxiety and reactive depression Hasn't tolerated any meds except alprazolam 

## 2023-10-19 NOTE — Assessment & Plan Note (Signed)
 PDMP reviewed No concerns

## 2023-10-19 NOTE — Assessment & Plan Note (Signed)
 Ongoing disability from chronic pain--can't exercise Recommended flu/RSV/Td at pharmacy--doesn't want COVID vaccine Colonoscopy would be due 2028--not sure she wants to proceed Recommended one last screening mammogram No pap due to age

## 2023-10-19 NOTE — Progress Notes (Signed)
 Subjective:    Patient ID: Lindsey Ward, female    DOB: [birth year], 72 y.o.   MRN: [id number]  HPI Here for physical  Had neurology evaluation--sig cognitive issues but Alzheimer's testing basically negative  Still gets rapid heart at times Feels out of breath frequently as well No exercise with her chronic pain Stays in bed at times--with the fibromyalgia pain Rarely feels enough energy for grocery shopping Does laundry---doesn't cook---eats sandwiches or prepared food  Current Outpatient Medications on File Prior to Visit  Medication Sig Dispense Refill   ALPRAZolam  (XANAX ) 1 MG tablet Take 1 tablet (1 mg total) by mouth 3 (three) times daily as needed. 90 tablet 0   carboxymethylcellulose (REFRESH PLUS) 0.5 % SOLN 1 drop 3 (three) times daily as needed.     HYDROcodone -acetaminophen  (NORCO/VICODIN) 5-325 MG tablet Take 1 tablet by mouth 3 (three) times daily as needed. Dx Code M79.7 90 tablet 0   hydrocortisone  2.5 % cream Apply topically 2 (two) times daily.     hydrocortisone  2.5 % lotion Apply topically 2 (two) times daily. 118 mL 1   hydrOXYzine  (ATARAX ) 10 MG tablet Take 1 tablet (10 mg total) by mouth 3 (three) times daily as needed for itching. 60 tablet 5   Menthol, Topical Analgesic, (BIOFREEZE EX) Apply 1 application  topically as needed.     montelukast  (SINGULAIR ) 10 MG tablet TAKE 1 TABLET BY MOUTH EVERYDAY AT BEDTIME 90 tablet 3   Multiple Vitamins-Minerals (VISION VITAMINS PO) Take by mouth.     omeprazole  (PRILOSEC) 20 MG capsule TAKE 1 CAPSULE BY MOUTH EVERY DAY 90 capsule 3   Polyethyl Glycol-Propyl Glycol (SYSTANE OP) Apply to eye as needed.     promethazine  (PHENERGAN ) 12.5 MG tablet Take 1-2 tablets (12.5-25 mg total) by mouth 3 (three) times daily as needed for nausea or vomiting. 60 tablet 0   SUMAtriptan  (IMITREX ) 50 MG tablet TAKE 0.5-1 TABLET BY MOUTH DAILY AS NEEDED FOR MIGRAINE. 9 tablet 11   triamcinolone  cream (KENALOG ) 0.1 % Apply 1 Application  topically 2 (two) times daily. 30 g 0   No current facility-administered medications on file prior to visit.    Allergies  Allergen Reactions   Aspirin Other (See Comments)    REACTION: GI bleed   Hydralazine Other (See Comments)    hallucinations   Ibuprofen Other (See Comments)    REACTION: GI bleed   Tizanidine Other (See Comments)    throws me in a different world   Buspirone Hcl     REACTION: unspecified   Ciprofloxacin     REACTION: unspecified   Citalopram  Other (See Comments)    Ear ringing.    Codeine    Diazepam    Duloxetine      REACTION: sz like activity   Imipramine  Hcl Other (See Comments)    Ear ringing.    Oxycodone-Acetaminophen      REACTION: unspecified   Propoxyphene Hcl    Sulfonamide Derivatives     REACTION: unspecified   Tramadol Hcl Other (See Comments)    REACTION: doesn't remember what happened but couldn't tolerate    Venlafaxine     REACTION: unspecified   Methocarbamol  Rash   Pregabalin Other (See Comments)    REACTION: kept her up and confusion   Risperidone Other (See Comments)    REACTION: confusion   Savella  [Milnacipran  Hcl] Palpitations and Other (See Comments)    Dizziness, Body wouldn't move, SOB, etc    Past Medical History:  Diagnosis Date  Bowel obstruction (HCC)    Chronic fatigue    Depression    Fibromyalgia    GERD (gastroesophageal reflux disease)    Hyperlipemia    Migraine    Obstipation    Osteopenia    PUD (peptic ulcer disease)     Past Surgical History:  Procedure Laterality Date   ABDOMINAL SURGERY     APPENDECTOMY  2005   BREAST BIOPSY Bilateral 1999   rt w/out clip - neg, lt w/clip - neg   BREAST CYST ASPIRATION Right 2003   neg   CHOLECYSTECTOMY  2004   COLONOSCOPY WITH PROPOFOL  N/A 06/09/2016   Procedure: COLONOSCOPY WITH PROPOFOL ;  Surgeon: Ruel Kung, MD;  Location: [facility];  Service: Endoscopy;  Laterality: N/A;   ESOPHAGOGASTRODUODENOSCOPY (EGD) WITH PROPOFOL  N/A 06/09/2016    Procedure: ESOPHAGOGASTRODUODENOSCOPY (EGD) WITH PROPOFOL ;  Surgeon: Ruel Kung, MD;  Location: [facility];  Service: Endoscopy;  Laterality: N/A;   EYE SURGERY     GIVENS CAPSULE STUDY N/A 06/23/2016   Procedure: GIVENS CAPSULE STUDY;  Surgeon: Ruel Kung, MD;  Location: [facility];  Service: Endoscopy;  Laterality: N/A;   PARS PLANA VITRECTOMY W/ REPAIR OF MACULAR HOLE Left 01/2017   [facility]   TOTAL ABDOMINAL HYSTERECTOMY W/ BILATERAL SALPINGOOPHORECTOMY  1986    Family History  Problem Relation Age of Onset   Heart disease Mother        AMI   Stroke Mother    Heart disease Father    Hypertension Sister    Breast cancer Sister    Hypertension Sister    Heart disease Sister        MI   Diabetes Sister    Arthritis/Rheumatoid Sister    COPD Sister    Kidney cancer Neg Hx    Kidney disease Neg Hx    Prostate cancer Neg Hx     Social History   Socioeconomic History   Marital status: Divorced    Spouse name: Not on file   Number of children: 1   Years of education: Not on file   Highest education level: 9th grade  Occupational History   Occupation: DISABLED  Tobacco Use   Smoking status: Former    Current packs/day: 0.00    Average packs/day: 1 pack/day for 40.0 years (40.0 ttl pk-yrs)    Types: Cigarettes    Start date: 09/25/1973    Quit date: 09/25/2013    Years since quitting: 10.0    Passive exposure: Never   Smokeless tobacco: Never   Tobacco comments:       Vaping Use   Vaping status: Never Used  Substance and Sexual Activity   Alcohol use: Not Currently    Alcohol/week: 0.0 standard drinks of alcohol    Comment: rarely   Drug use: No   Sexual activity: Not on file  Other Topics Concern   Not on file  Social History Narrative   Living alone again      No living will   Daughter should make health care decisions   Requests DNR---done 10/24/14   No tube feeds if cognitively unaware   Highest level of education: 9th   Social Drivers of Health    Financial Resource Strain: Low Risk  (10/19/2023)   Overall Financial Resource Strain (CARDIA)    Difficulty of Paying Living Expenses: Not hard at all  Food Insecurity: No Food Insecurity (10/19/2023)   Hunger Vital Sign    Worried About Running Out of Food in the Last Year: Never  true    Ran Out of Food in the Last Year: Never true  Transportation Needs: No Transportation Needs (10/19/2023)   PRAPARE - Administrator, Civil Service (Medical): No    Lack of Transportation (Non-Medical): No  Physical Activity: Inactive (10/19/2023)   Exercise Vital Sign    Days of Exercise per Week: 0 days    Minutes of Exercise per Session: Not on file  Stress: No Stress Concern Present (10/19/2023)   [employer] of Occupational Health - Occupational Stress Questionnaire    Feeling of Stress: Only a little  Social Connections: Socially Isolated (10/19/2023)   Social Connection and Isolation Panel    Frequency of Communication with Friends and Family: More than three times a week    Frequency of Social Gatherings with Friends and Family: Once a week    Attends Religious Services: Never    Database administrator or Organizations: No    Attends Engineer, structural: Not on file    Marital Status: Divorced  Intimate Partner Violence: Not At Risk (07/18/2023)   Humiliation, Afraid, Rape, and Kick questionnaire    Fear of Current or Ex-Partner: No    Emotionally Abused: No    Physically Abused: No    Sexually Abused: No   Review of Systems  Constitutional:  Positive for fatigue. Negative for unexpected weight change.       Wears seat belt  HENT:  Negative for dental problem, hearing loss and tinnitus.        Keeps up with dentist  Eyes:  Negative for visual disturbance.       No diplopia or unilateral vision loss  Respiratory:  Positive for shortness of breath. Negative for cough and chest tightness.   Cardiovascular:  Positive for palpitations. Negative for chest pain and leg  swelling.  Gastrointestinal:  Positive for constipation. Negative for blood in stool.       Uses mineral oil for bowels No heartburn  Endocrine: Negative for polydipsia and polyuria.  Genitourinary:  Negative for dysuria and hematuria.  Musculoskeletal:  Positive for arthralgias, back pain and myalgias.  Skin:  Negative for rash.       No suspicious skin lesions  Allergic/Immunologic: Negative for environmental allergies and immunocompromised state.  Neurological:  Positive for dizziness. Negative for syncope.       Rare headaches  Hematological:  Negative for adenopathy. Bruises/bleeds easily.  Psychiatric/Behavioral:  Positive for dysphoric mood. Negative for sleep disturbance. The patient is nervous/anxious.        Objective:   Physical Exam Constitutional:      Appearance: Normal appearance.  HENT:     Mouth/Throat:     Pharynx: No oropharyngeal exudate or posterior oropharyngeal erythema.  Eyes:     Conjunctiva/sclera: Conjunctivae normal.     Pupils: Pupils are equal, round, and reactive to light.  Cardiovascular:     Rate and Rhythm: Normal rate and regular rhythm.     Pulses: Normal pulses.     Heart sounds: No murmur heard.    No gallop.  Pulmonary:     Effort: Pulmonary effort is normal.     Breath sounds: Normal breath sounds. No wheezing or rales.  Abdominal:     Palpations: Abdomen is soft.     Tenderness: There is no abdominal tenderness.  Musculoskeletal:     Cervical back: Neck supple.     Right lower leg: No edema.     Left lower leg: No edema.  Lymphadenopathy:  Cervical: No cervical adenopathy.  Skin:    Findings: No rash.  Neurological:     Mental Status: She is alert.  Psychiatric:        Behavior: Behavior normal.     Comments: Mild depressed mood            Assessment & Plan:

## 2023-10-19 NOTE — Assessment & Plan Note (Signed)
 No prolonged bursts Did see cardiologist--no Rx

## 2023-10-19 NOTE — Assessment & Plan Note (Signed)
 Severe and disabling Uses the hydrocodone  for this Has failed every medication I tried

## 2023-10-23 ENCOUNTER — Ambulatory Visit: Payer: Self-pay

## 2023-10-23 ENCOUNTER — Ambulatory Visit: Admitting: Nurse Practitioner

## 2023-10-23 NOTE — Telephone Encounter (Signed)
 I am not sure what else to try--but definitely would not be easy for Matt to try what to do---given her failure with literally dozens of medication trials

## 2023-10-23 NOTE — Telephone Encounter (Signed)
 This is definitely not something Adina needs to see. Made her an appt with Dr Jimmy tomorrow at 1230. Dr Jimmy advised not to take any more than 2 hydrocodone  daily. Advised her to take acetaminophen . She has 500 mg ones. I advised her she can have a total of 3000 mg of acetaminophen  in 24 hours. That includes the 325 mg in the hydrocodone . So I advised her that she can do 1000 mg 2 more times today then can take hydrocodone  in the morning.

## 2023-10-23 NOTE — Telephone Encounter (Signed)
 FYI Only or Action Required?: Action required by provider: request for appointment.  Patient was last seen in primary care on 10/19/2023 by Lindsey Charlie FERNS, MD.  Called Nurse Triage reporting No chief complaint on file..  Symptoms began today.  Interventions attempted: Prescription medications: hydrocodone .  Symptoms are: gradually worsening.  Triage Disposition: See HCP Within 4 Hours (Or PCP Triage)  Patient/caregiver understands and will follow disposition?: YesCopied from CRM [id number]. Topic: Clinical - Red Word Triage >> Oct 23, 2023  1:55 PM Lindsey Ward wrote: Red Word that prompted transfer to Nurse Triage: Patient called in upset saying she is having really bad body pain all over. Patient states that she had to take two of her pain pills and she is still in a lot of great pain. Patient wanted to see Dr. Jimmy today but his next appt is not until tomorrow. Patient is requesting to speak with him, a nurse or deliver a message because she said this is urgent and the pain is unbearable. Reason for Disposition  Nursing judgment or information in reference  Answer Assessment - Initial Assessment Questions 1. REASON FOR CALL: What is your main concern right now?    Whole body hurts. I've had for years. I can't pinpoint it. From my head to my toes.Pain is 10/10. Its sharp. Wont go away. I've taken 2 hydrocodones. Pt denies SOB, chest pain.  Protocols used: No Guideline Available-A-AH

## 2023-10-23 NOTE — Telephone Encounter (Signed)
 Reviewing the chart I do not feel this is an appropriate visit for me. This seems chronic

## 2023-10-24 ENCOUNTER — Encounter: Payer: Self-pay | Admitting: Internal Medicine

## 2023-10-24 ENCOUNTER — Ambulatory Visit (INDEPENDENT_AMBULATORY_CARE_PROVIDER_SITE_OTHER): Admitting: Internal Medicine

## 2023-10-24 VITALS — BP 120/60 | HR 62 | Temp 97.7°F | Ht 62.0 in | Wt 104.0 lb

## 2023-10-24 DIAGNOSIS — M797 Fibromyalgia: Secondary | ICD-10-CM

## 2023-10-24 MED ORDER — DULOXETINE HCL 20 MG PO CPEP: 20.0000 mg | ORAL_CAPSULE | Freq: Every day | ORAL | 3 refills | Status: DC

## 2023-10-24 NOTE — Assessment & Plan Note (Signed)
 Had gone to pain specialist---injections for her back did help---but this is different Reviewed her med record I don't see duloxetine ---will try 20mg  dose and titrate if tolerated Could also consider lyrica

## 2023-10-24 NOTE — Progress Notes (Signed)
 Subjective:    Patient ID: Lindsey Ward, female    DOB: 06-Mar-1951, 73 y.o.   MRN: [id number]  HPI Here due to ongoing pain issues and trouble with all activity--even getting out of bed  Ongoing severe pain from fibromyalgia Can't do any housework--even sweep the floor Tries the hydrocodone  but it doesn't really help at all  Has failed gabapentin , imipramine , amitriptyline , nortriptyline , cyclobenzaprine , baclofen, citalopram , tramadol, savella , ritalin , meloxicam, lidocaine , methocarbamol   Not clear if she has ever tried lyrica or duloxetine   Is going back to neurologist for reevaluation  Current Outpatient Medications on File Prior to Visit  Medication Sig Dispense Refill   ALPRAZolam  (XANAX ) 1 MG tablet Take 1 tablet (1 mg total) by mouth 3 (three) times daily as needed. 90 tablet 0   carboxymethylcellulose (REFRESH PLUS) 0.5 % SOLN 1 drop 3 (three) times daily as needed.     HYDROcodone -acetaminophen  (NORCO/VICODIN) 5-325 MG tablet Take 1 tablet by mouth 3 (three) times daily as needed. Dx Code M79.7 90 tablet 0   hydrocortisone  2.5 % cream Apply topically 2 (two) times daily.     hydrocortisone  2.5 % lotion Apply topically 2 (two) times daily. 118 mL 1   hydrOXYzine  (ATARAX ) 10 MG tablet Take 1 tablet (10 mg total) by mouth 3 (three) times daily as needed for itching. 60 tablet 5   Menthol, Topical Analgesic, (BIOFREEZE EX) Apply 1 application  topically as needed.     montelukast  (SINGULAIR ) 10 MG tablet TAKE 1 TABLET BY MOUTH EVERYDAY AT BEDTIME 90 tablet 3   Multiple Vitamins-Minerals (VISION VITAMINS PO) Take by mouth.     omeprazole  (PRILOSEC) 20 MG capsule TAKE 1 CAPSULE BY MOUTH EVERY DAY 90 capsule 3   Polyethyl Glycol-Propyl Glycol (SYSTANE OP) Apply to eye as needed.     promethazine  (PHENERGAN ) 12.5 MG tablet Take 1-2 tablets (12.5-25 mg total) by mouth 3 (three) times daily as needed for nausea or vomiting. 60 tablet 0   SUMAtriptan  (IMITREX ) 50 MG tablet TAKE  0.5-1 TABLET BY MOUTH DAILY AS NEEDED FOR MIGRAINE. 9 tablet 11   triamcinolone  cream (KENALOG ) 0.1 % Apply 1 Application topically 2 (two) times daily. 30 g 0   No current facility-administered medications on file prior to visit.    Allergies  Allergen Reactions   Aspirin Other (See Comments)    REACTION: GI bleed   Hydralazine Other (See Comments)    hallucinations   Ibuprofen Other (See Comments)    REACTION: GI bleed   Tizanidine Other (See Comments)    throws me in a different world   Buspirone Hcl     REACTION: unspecified   Ciprofloxacin     REACTION: unspecified   Citalopram  Other (See Comments)    Ear ringing.    Codeine    Diazepam    Duloxetine      REACTION: sz like activity   Imipramine  Hcl Other (See Comments)    Ear ringing.    Oxycodone-Acetaminophen      REACTION: unspecified   Propoxyphene Hcl    Sulfonamide Derivatives     REACTION: unspecified   Tramadol Hcl Other (See Comments)    REACTION: doesn't remember what happened but couldn't tolerate    Venlafaxine     REACTION: unspecified   Methocarbamol  Rash   Pregabalin Other (See Comments)    REACTION: kept her up and confusion   Risperidone Other (See Comments)    REACTION: confusion   Savella  [Milnacipran  Hcl] Palpitations and Other (See Comments)  Dizziness, Body wouldn't move, SOB, etc    Past Medical History:  Diagnosis Date   Bowel obstruction (HCC)    Chronic fatigue    Depression    Fibromyalgia    GERD (gastroesophageal reflux disease)    Hyperlipemia    Migraine    Obstipation    Osteopenia    PUD (peptic ulcer disease)     Past Surgical History:  Procedure Laterality Date   ABDOMINAL SURGERY     APPENDECTOMY  2005   BREAST BIOPSY Bilateral 1999   rt w/out clip - neg, lt w/clip - neg   BREAST CYST ASPIRATION Right 2003   neg   CHOLECYSTECTOMY  2004   COLONOSCOPY WITH PROPOFOL  N/A 06/09/2016   Procedure: COLONOSCOPY WITH PROPOFOL ;  Surgeon: Ruel Kung, MD;  Location:  [facility];  Service: Endoscopy;  Laterality: N/A;   ESOPHAGOGASTRODUODENOSCOPY (EGD) WITH PROPOFOL  N/A 06/09/2016   Procedure: ESOPHAGOGASTRODUODENOSCOPY (EGD) WITH PROPOFOL ;  Surgeon: Ruel Kung, MD;  Location: [facility];  Service: Endoscopy;  Laterality: N/A;   EYE SURGERY     GIVENS CAPSULE STUDY N/A 06/23/2016   Procedure: GIVENS CAPSULE STUDY;  Surgeon: Ruel Kung, MD;  Location: [facility];  Service: Endoscopy;  Laterality: N/A;   PARS PLANA VITRECTOMY W/ REPAIR OF MACULAR HOLE Left 01/2017   [facility]   TOTAL ABDOMINAL HYSTERECTOMY W/ BILATERAL SALPINGOOPHORECTOMY  1986    Family History  Problem Relation Age of Onset   Heart disease Mother        AMI   Stroke Mother    Heart disease Father    Hypertension Sister    Breast cancer Sister    Hypertension Sister    Heart disease Sister        MI   Diabetes Sister    Arthritis/Rheumatoid Sister    COPD Sister    Kidney cancer Neg Hx    Kidney disease Neg Hx    Prostate cancer Neg Hx     Social History   Socioeconomic History   Marital status: Divorced    Spouse name: Not on file   Number of children: 1   Years of education: Not on file   Highest education level: 9th grade  Occupational History   Occupation: DISABLED  Tobacco Use   Smoking status: Former    Current packs/day: 0.00    Average packs/day: 1 pack/day for 40.0 years (40.0 ttl pk-yrs)    Types: Cigarettes    Start date: 09/25/1973    Quit date: 09/25/2013    Years since quitting: 10.0    Passive exposure: Never   Smokeless tobacco: Never   Tobacco comments:       Vaping Use   Vaping status: Never Used  Substance and Sexual Activity   Alcohol use: Not Currently    Alcohol/week: 0.0 standard drinks of alcohol    Comment: rarely   Drug use: No   Sexual activity: Not on file  Other Topics Concern   Not on file  Social History Narrative   Living alone again      No living will   Daughter should make health care decisions   Requests  DNR---done 10/24/14   No tube feeds if cognitively unaware   Highest level of education: 9th   Social Drivers of Health   Financial Resource Strain: Low Risk  (10/19/2023)   Overall Financial Resource Strain (CARDIA)    Difficulty of Paying Living Expenses: Not hard at all  Food Insecurity: No Food Insecurity (10/19/2023)  Hunger Vital Sign    Worried About Running Out of Food in the Last Year: Never true    Ran Out of Food in the Last Year: Never true  Transportation Needs: No Transportation Needs (10/19/2023)   PRAPARE - Administrator, Civil Service (Medical): No    Lack of Transportation (Non-Medical): No  Physical Activity: Inactive (10/19/2023)   Exercise Vital Sign    Days of Exercise per Week: 0 days    Minutes of Exercise per Session: Not on file  Stress: No Stress Concern Present (10/19/2023)   [employer] of Occupational Health - Occupational Stress Questionnaire    Feeling of Stress: Only a little  Social Connections: Socially Isolated (10/19/2023)   Social Connection and Isolation Panel    Frequency of Communication with Friends and Family: More than three times a week    Frequency of Social Gatherings with Friends and Family: Once a week    Attends Religious Services: Never    Database administrator or Organizations: No    Attends Engineer, structural: Not on file    Marital Status: Divorced  Intimate Partner Violence: Not At Risk (07/18/2023)   Humiliation, Afraid, Rape, and Kick questionnaire    Fear of Current or Ex-Partner: No    Emotionally Abused: No    Physically Abused: No    Sexually Abused: No   Review of Systems     Objective:   Physical Exam Constitutional:      Comments: Cold, no distress  Neurological:     Mental Status: She is alert.            Assessment & Plan:

## 2023-10-31 ENCOUNTER — Ambulatory Visit: Payer: Self-pay

## 2023-10-31 NOTE — Telephone Encounter (Signed)
 Spoke to pt. She was telling me she took an Imitrex  last night because she had a migraine. When she woke up this morning she was feeling really bad with the diarrhea and feeling scattered not feeling right. I looked up Imitrex  with Cymbalta  and the side effects. She said that was how she was feeling. She still thinks she does not want to stay on the Cymbalta  because she has been taking it for 8 days and has not seen a difference. I explained it can take a few weeks to really start noticing a difference. She is still not interested.

## 2023-10-31 NOTE — Telephone Encounter (Signed)
  FYI Only or Action Required?: Action required by provider: clinical question for provider and update on patient condition.  Patient was last seen in primary care on 10/24/2023 by Jimmy Charlie FERNS, MD.  Called Nurse Triage reporting Medication Problem.  Symptoms began a week ago.  Interventions attempted: Nothing.  Symptoms are: gradually worsening.  Triage Disposition: Call PCP When Office is Open- please contact patient at [phone]  Patient/caregiver understands and will follow disposition?: Yes  Copied from CRM #[id number]. Topic: Clinical - Red Word Triage >> Oct 31, 2023  9:51 AM Mesmerise C wrote: [facility] that prompted transfer to Nurse Triage: Patient stated that DULoxetine  (CYMBALTA ) 20 MG she's been taking for 8 days has been making her feel out of it, jittery and all over the place, has been experiencing diarrhea multiple times as well Reason for Disposition  [1] Caller has NON-URGENT medicine question about med that PCP prescribed AND [2] triager unable to answer question  Answer Assessment - Initial Assessment Questions 1. NAME of MEDICINE: What medicine(s) are you calling about?     Duloxetine  2. QUESTION: What is your question? (e.g., double dose of medicine, side effect)     Concerned for side effects 3. PRESCRIBER: Who prescribed the medicine? Reason: if prescribed by specialist, call should be referred to that group.     Dr. Jimmy 4. SYMPTOMS: Do you have any symptoms? If Yes, ask: What symptoms are you having?  How bad are the symptoms (e.g., mild, moderate, severe)     Anxious, distracted, shaky- occurred since she has began taking duloxetine  and has progressively worsened.   Diarrhea after meals 3-4 hours after taking duloxetine  (8 days)- states that she drinks plenty of water and has no symptoms of dehydration 5. PREGNANCY:  Is there any chance that you are pregnant? When was your last menstrual period?     N/A  Protocols used: Medication  Question Call-A-AH

## 2023-10-31 NOTE — Addendum Note (Signed)
 Addended by: KALLIE CLOTILDA SQUIBB on: 10/31/2023 03:16 PM   Modules accepted: Orders

## 2023-11-15 ENCOUNTER — Other Ambulatory Visit: Payer: Self-pay

## 2023-11-15 MED ORDER — ALPRAZOLAM 1 MG PO TABS: 1.0000 mg | ORAL_TABLET | Freq: Three times a day (TID) | ORAL | 0 refills | Status: DC | PRN

## 2023-11-15 MED ORDER — HYDROCODONE-ACETAMINOPHEN 5-325 MG PO TABS: 1.0000 | ORAL_TABLET | Freq: Three times a day (TID) | ORAL | 0 refills | Status: DC | PRN

## 2023-11-15 NOTE — Telephone Encounter (Unsigned)
 Copied from CRM [id number]. Topic: Clinical - Medication Refill >> Nov 15, 2023 10:30 AM Macario HERO wrote: Medication: HYDROcodone -acetaminophen  (NORCO/VICODIN) 5-325 MG tablet [[id number]] ALPRAZolam  (XANAX ) 1 MG tablet [[id number]]  Has the patient contacted their pharmacy? No (Agent: If no, request that the patient contact the pharmacy for the refill. If patient does not wish to contact the pharmacy document the reason why and proceed with request.) (Agent: If yes, when and what did the pharmacy advise?)  This is the patient's preferred pharmacy:  [id number] [place], [place] - [address] [place] [address] Phone: [phone] Fax: [phone]  Is this the correct pharmacy for this prescription? Yes If no, delete pharmacy and type the correct one.   Has the prescription been filled recently? Yes  Is the patient out of the medication? No  Has the patient been seen for an appointment in the last year OR does the patient have an upcoming appointment? Yes  Can we respond through MyChart? No  Agent: Please be advised that Rx refills may take up to 3 business days. We ask that you follow-up with your pharmacy.

## 2023-11-22 DIAGNOSIS — M797 Fibromyalgia: Secondary | ICD-10-CM | POA: Diagnosis not present

## 2023-11-22 DIAGNOSIS — R269 Unspecified abnormalities of gait and mobility: Secondary | ICD-10-CM | POA: Diagnosis not present

## 2023-12-08 ENCOUNTER — Other Ambulatory Visit: Payer: Self-pay

## 2023-12-08 MED ORDER — HYDROCODONE-ACETAMINOPHEN 5-325 MG PO TABS: 1.0000 | ORAL_TABLET | Freq: Three times a day (TID) | ORAL | 0 refills | Status: DC | PRN

## 2023-12-08 MED ORDER — ALPRAZOLAM 1 MG PO TABS: 1.0000 mg | ORAL_TABLET | Freq: Three times a day (TID) | ORAL | 0 refills | Status: DC | PRN

## 2023-12-08 NOTE — Telephone Encounter (Unsigned)
 Copied from CRM #[id number]. Topic: Clinical - Medication Refill >> Dec 08, 2023 10:25 AM Viola F wrote: Medication: ALPRAZolam  (XANAX ) 1 MG tablet [[id number]] HYDROcodone -acetaminophen  (NORCO/VICODIN) 5-325 MG tablet [[id number]]  Has the patient contacted their pharmacy? Yes (Agent: If no, request that the patient contact the pharmacy for the refill. If patient does not wish to contact the pharmacy document the reason why and proceed with request.) (Agent: If yes, when and what did the pharmacy advise?)  This is the patient's preferred pharmacy:  [id number] [place], [place] - [address] [address] Phone: [phone] Fax: [phone]  Is this the correct pharmacy for this prescription? Yes If no, delete pharmacy and type the correct one.   Has the prescription been filled recently? Yes  Is the patient out of the medication? No, ENOUGH FOR 2 MORE DAYS   Has the patient been seen for an appointment in the last year OR does the patient have an upcoming appointment? Yes  Can we respond through MyChart? Yes  Agent: Please be advised that Rx refills may take up to 3 business days. We ask that you follow-up with your pharmacy.

## 2024-01-19 ENCOUNTER — Ambulatory Visit (INDEPENDENT_AMBULATORY_CARE_PROVIDER_SITE_OTHER)

## 2024-01-19 VITALS — BP 124/64 | HR 48 | Temp 98.5°F | Ht 62.0 in | Wt 107.0 lb

## 2024-01-19 DIAGNOSIS — R52 Pain, unspecified: Secondary | ICD-10-CM

## 2024-01-19 DIAGNOSIS — L299 Pruritus, unspecified: Secondary | ICD-10-CM | POA: Diagnosis not present

## 2024-01-19 DIAGNOSIS — F419 Anxiety disorder, unspecified: Secondary | ICD-10-CM

## 2024-01-19 DIAGNOSIS — Z780 Asymptomatic menopausal state: Secondary | ICD-10-CM

## 2024-01-19 DIAGNOSIS — F1320 Sedative, hypnotic or anxiolytic dependence, uncomplicated: Secondary | ICD-10-CM | POA: Diagnosis not present

## 2024-01-19 DIAGNOSIS — F339 Major depressive disorder, recurrent, unspecified: Secondary | ICD-10-CM | POA: Insufficient documentation

## 2024-01-19 LAB — CBC WITH DIFFERENTIAL/PLATELET
Basophils Absolute: 0 K/uL (ref 0.0–0.1)
Basophils Relative: 0.8 % (ref 0.0–3.0)
Eosinophils Absolute: 0.1 K/uL (ref 0.0–0.7)
Eosinophils Relative: 1.5 % (ref 0.0–5.0)
HCT: 29.9 % — ABNORMAL LOW (ref 36.0–46.0)
Hemoglobin: 9.9 g/dL — ABNORMAL LOW (ref 12.0–15.0)
Lymphocytes Relative: 21.7 % (ref 12.0–46.0)
Lymphs Abs: 1.2 K/uL (ref 0.7–4.0)
MCHC: 33.3 g/dL (ref 30.0–36.0)
MCV: 88.8 fl (ref 78.0–100.0)
Monocytes Absolute: 0.4 K/uL (ref 0.1–1.0)
Monocytes Relative: 7.7 % (ref 3.0–12.0)
Neutro Abs: 3.8 K/uL (ref 1.4–7.7)
Neutrophils Relative %: 68.3 % (ref 43.0–77.0)
Platelets: 233 K/uL (ref 150.0–400.0)
RBC: 3.36 Mil/uL — ABNORMAL LOW (ref 3.87–5.11)
RDW: 13.1 % (ref 11.5–15.5)
WBC: 5.5 K/uL (ref 4.0–10.5)

## 2024-01-19 LAB — GAMMA GT: GGT: 9 U/L (ref 7–51)

## 2024-01-19 LAB — ALKALINE PHOSPHATASE: Alkaline Phosphatase: 75 U/L (ref 39–117)

## 2024-01-19 LAB — CK: Total CK: 75 U/L (ref 17–177)

## 2024-01-19 LAB — C-REACTIVE PROTEIN: CRP: <0.5 mg/dL (ref 0.5–20.0)

## 2024-01-19 MED ORDER — ALPRAZOLAM 1 MG PO TABS: 1.0000 mg | ORAL_TABLET | Freq: Every day | ORAL | Status: DC

## 2024-01-19 NOTE — Patient Instructions (Addendum)
 Thank you for visiting [facility] today! Here's what we talked about: - Stop Atarax  - Start taking two tablets of Lexapro - Try to only use Xanax  at night-time, we will re-discuss later - Watch for potential withdrawal symptoms

## 2024-01-19 NOTE — Progress Notes (Signed)
 Subjective:   This visit was conducted in person. The patient gave informed consent to the use of Abridge AI technology to record the contents of the encounter as documented below.   Patient ID: Lindsey Ward, female    DOB: Jan 01, 1951, 73 y.o.   MRN: [id number]   Discussed the use of AI scribe software for clinical note transcription with the patient, who gave verbal consent to proceed.  History of Present Illness Lindsey Ward is a 73 year old female with fibromyalgia who presents with generalized body pain and medication review.  She experiences generalized body pain, described as being present 'all over' and rates it as a 10 out of 10 in severity. The pain does not completely resolve with her current medication regimen, but it can decrease to a level of 2 out of 10 after taking hydrocodone . She has been on hydrocodone  for a long time, taking one tablet twice daily, and occasionally three times a day, but never exceeding this amount. The pain relief from hydrocodone  does not last as long as it used to.  She has a history of fibromyalgia and has been using hydrocodone  to manage her pain. She has also been on prednisone  in the past, with mixed results regarding pain relief.  She experiences itching primarily on her scalp, which has been present for about a year. The itching was not present before starting hydrocodone . She uses Singulair , which helps calm the itching. No new skin rashes are reported.  She is currently taking several medications including Imitrex  for migraines, which she uses about once a month, Prilosec as needed for acid reflux, montelukast  (Singulair ), and Lexapro for mood. She takes Lexapro 5 mg daily, primarily for feeling down, and is considering increasing the dose to 10 mg. She also takes Xanax , one milligram at night to help with sleep, and occasionally during the day if needed for anxiety.  No night sweats, new skin rashes, or significant weight loss. Her weight has  been stable between 101 and 105 pounds. She has not had any recent bone scans. She manages her medications independently and uses a pill box to organize them.   Review of Systems  All other systems reviewed and are negative.       Allergies  Allergen Reactions   Aspirin Other (See Comments)    REACTION: GI bleed   Hydralazine Other (See Comments)    hallucinations   Ibuprofen Other (See Comments)    REACTION: GI bleed   Tizanidine Other (See Comments)    throws me in a different world   Buspirone Hcl     REACTION: unspecified   Ciprofloxacin     REACTION: unspecified   Citalopram  Other (See Comments)    Ear ringing.    Codeine    Diazepam    Duloxetine      REACTION: sz like activity   making her feel out of it, jittery and all over the place, has been experiencing diarrhea   Imipramine  Hcl Other (See Comments)    Ear ringing.    Oxycodone-Acetaminophen      REACTION: unspecified   Propoxyphene Hcl    Sulfonamide Derivatives     REACTION: unspecified   Tramadol Hcl Other (See Comments)    REACTION: doesn't remember what happened but couldn't tolerate    Venlafaxine     REACTION: unspecified   Methocarbamol  Rash   Pregabalin Other (See Comments)    REACTION: kept her up and confusion   Risperidone Other (See Comments)  REACTION: confusion   Savella  [Milnacipran  Hcl] Palpitations and Other (See Comments)    Dizziness, Body wouldn't move, SOB, etc    Current Outpatient Medications on File Prior to Visit  Medication Sig Dispense Refill   carboxymethylcellulose (REFRESH PLUS) 0.5 % SOLN 1 drop 3 (three) times daily as needed.     escitalopram (LEXAPRO) 5 MG tablet Take 10 mg by mouth daily.     HYDROcodone -acetaminophen  (NORCO/VICODIN) 5-325 MG tablet Take 1 tablet by mouth 3 (three) times daily as needed. Dx Code M79.7 90 tablet 0   hydrocortisone  2.5 % cream Apply topically 2 (two) times daily.     hydrocortisone  2.5 % lotion Apply topically 2 (two) times  daily. 118 mL 1   Menthol, Topical Analgesic, (BIOFREEZE EX) Apply 1 application  topically as needed.     montelukast  (SINGULAIR ) 10 MG tablet TAKE 1 TABLET BY MOUTH EVERYDAY AT BEDTIME 90 tablet 3   omeprazole  (PRILOSEC) 20 MG capsule TAKE 1 CAPSULE BY MOUTH EVERY DAY 90 capsule 3   Polyethyl Glycol-Propyl Glycol (SYSTANE OP) Apply to eye as needed.     promethazine  (PHENERGAN ) 12.5 MG tablet Take 1-2 tablets (12.5-25 mg total) by mouth 3 (three) times daily as needed for nausea or vomiting. 60 tablet 0   SUMAtriptan  (IMITREX ) 50 MG tablet TAKE 0.5-1 TABLET BY MOUTH DAILY AS NEEDED FOR MIGRAINE. 9 tablet 11   triamcinolone  cream (KENALOG ) 0.1 % Apply 1 Application topically 2 (two) times daily. 30 g 0   No current facility-administered medications on file prior to visit.    BP 124/64 (BP Location: Right Arm, Patient Position: Sitting, Cuff Size: Normal)   Pulse (!) 48   Temp 98.5 F (36.9 C) (Oral)   Ht 5' 2 (1.575 m)   Wt 107 lb (48.5 kg)   SpO2 99%   BMI 19.57 kg/m   Objective:      Physical Exam GENERAL: Alert, cooperative, well developed, no acute distress. HEAD: Normocephalic atraumatic. EYES: Extraocular movements intact bilaterally, pupils round, equal and reactive to light bilaterally, conjunctivae normal bilaterally. NEUROLOGICAL: Oriented to person, place and time, no gait abnormalities, moves all extremities without gross motor or sensory deficit. MUSCULOSKELETAL: Generalized musculoskeletal pain. No knee pain on movement.  Point tenderness to palpation over multiple points over the muscles in upper and lower extremities.  No joint tenderness.       Assessment & Plan:   Assessment & Plan Generalized chronic pain  Chronic pain thought to be due to fibromyalgia but I am doubtful at this time. Managed with hydrocodone  which is reportedly not completely effective. Concerns about interactions with Xanax  which patient is also on.  Given elevated alk phos earlier this  year from labs at [facility], will repeat alk phos and GGT, given possibility of bone pathology as a cause for pain.  Moreover, no recent DEXA scan on file, I have ordered one.  Other differentials include autoimmune causes like polymyositis or polymyalgia rheumatica, for this reason I have ordered ANA, CRP and CK.  Per chart review, patient was given steroid taper in 2024, she is unclear whether this was efficacious.  Low suspicion for synovitis given that patient's pain is primarily over muscle.  - Ordered labs for inflammation and alkaline phosphatase. - Continue hydrocodone  until lab results. - Consider steroids if inflammation confirmed. - Follow-up in three weeks for lab results and pain management.  Generalized pruritus Chronic pruritus, primarily scalp, partially relieved by Singulair . Previous elevated eosinophils, will reorder CBC.  This could be  a side effect of the hydrocodone , however patient insists was present before.  Recent labs from a few months ago did not show elevated LFTs. Next steps TBD based on workup.  - Ordered repeat alkaline phosphatase and CBC. - Continue Singulair .  Depression and anxiety disorder Depression with occasional anxiety, managed with Lexapro. Increased dose to address depressive symptoms. - Increased Lexapro to 10 mg daily. - Discussed therapy or counseling, patient not interested.  Benzodiazepine dependence Xanax  dependence for sleep, concerns about interaction with hydrocodone . She is agreeable to cut down dose as below, hope is to hopefully eventually wean off completely and treat sleep issues with safer meds.  - Reduced Xanax  to 1 mg nightly only, pt will try not to take 1 mg tablet during the day. - Discussed alternative sleep aids like melatonin or doxepin. - Scheduled follow-up to reassess sleep and Xanax  use.    Return in about 3 years (around 01/19/2027) for body aches then 10/18/2024 for CPE.   Krystopher Kuenzel K Lucerito Rosinski, MD  01/19/24     Contains text  generated by Abridge.

## 2024-01-20 ENCOUNTER — Ambulatory Visit: Payer: Self-pay

## 2024-01-20 DIAGNOSIS — D649 Anemia, unspecified: Secondary | ICD-10-CM

## 2024-01-22 LAB — DM TEMPLATE

## 2024-01-22 LAB — DRUG MONITORING, PANEL 8 WITH CONFIRMATION, URINE
6 Acetylmorphine: NEGATIVE ng/mL (ref <10)
Alcohol Metabolites: NEGATIVE ng/mL (ref <500)
Alphahydroxyalprazolam: 246 ng/mL — ABNORMAL HIGH (ref <25)
Alphahydroxymidazolam: NEGATIVE ng/mL (ref <50)
Alphahydroxytriazolam: NEGATIVE ng/mL (ref <50)
Aminoclonazepam: NEGATIVE ng/mL (ref <25)
Amphetamines: NEGATIVE ng/mL (ref <500)
Benzodiazepines: POSITIVE ng/mL — AB (ref <100)
Buprenorphine, Urine: NEGATIVE ng/mL (ref <5)
Cocaine Metabolite: NEGATIVE ng/mL (ref <150)
Codeine: NEGATIVE ng/mL (ref <50)
Creatinine: 76.6 mg/dL (ref >=20.0)
Hydrocodone: 882 ng/mL — ABNORMAL HIGH (ref <50)
Hydromorphone: 252 ng/mL — ABNORMAL HIGH (ref <50)
Hydroxyethylflurazepam: NEGATIVE ng/mL (ref <50)
Lorazepam: NEGATIVE ng/mL (ref <50)
MDMA: NEGATIVE ng/mL (ref <500)
Marijuana Metabolite: NEGATIVE ng/mL (ref <20)
Morphine: NEGATIVE ng/mL (ref <50)
Nordiazepam: NEGATIVE ng/mL (ref <50)
Norhydrocodone: 674 ng/mL — ABNORMAL HIGH (ref <50)
Opiates: POSITIVE ng/mL — AB (ref <100)
Oxazepam: NEGATIVE ng/mL (ref <50)
Oxidant: NEGATIVE ug/mL (ref <200)
Oxycodone: NEGATIVE ng/mL (ref <100)
Temazepam: NEGATIVE ng/mL (ref <50)
pH: 5.9 (ref 4.5–9.0)

## 2024-01-22 LAB — ANA: Anti Nuclear Antibody (ANA): NEGATIVE

## 2024-01-25 ENCOUNTER — Other Ambulatory Visit (INDEPENDENT_AMBULATORY_CARE_PROVIDER_SITE_OTHER)

## 2024-01-25 DIAGNOSIS — D649 Anemia, unspecified: Secondary | ICD-10-CM | POA: Diagnosis not present

## 2024-01-25 LAB — B12 AND FOLATE PANEL
Folate: 16.8 ng/mL (ref >5.9)
Vitamin B-12: 397 pg/mL (ref 211–911)

## 2024-01-25 LAB — IBC + FERRITIN
Ferritin: 7.9 ng/mL — ABNORMAL LOW (ref 10.0–291.0)
Iron: 35 ug/dL — ABNORMAL LOW (ref 42–145)
Saturation Ratios: 8.2 % — ABNORMAL LOW (ref 20.0–50.0)
TIBC: 427 ug/dL (ref 250.0–450.0)
Transferrin: 305 mg/dL (ref 212.0–360.0)

## 2024-01-25 NOTE — Addendum Note (Signed)
 Addended by: HOPE VEVA PARAS on: 01/25/2024 11:51 AM   Modules accepted: Orders

## 2024-01-29 ENCOUNTER — Ambulatory Visit: Payer: Self-pay

## 2024-01-29 ENCOUNTER — Other Ambulatory Visit: Payer: Self-pay

## 2024-01-29 DIAGNOSIS — D649 Anemia, unspecified: Secondary | ICD-10-CM

## 2024-01-29 DIAGNOSIS — D509 Iron deficiency anemia, unspecified: Secondary | ICD-10-CM

## 2024-01-29 MED ORDER — IRON (FERROUS SULFATE) 325 (65 FE) MG PO TABS: 325.0000 mg | ORAL_TABLET | ORAL | 1 refills | Status: AC

## 2024-01-31 LAB — FECAL OCCULT BLOOD, IMMUNOCHEMICAL: Fecal Occult Bld: NEGATIVE

## 2024-02-12 ENCOUNTER — Ambulatory Visit

## 2024-02-12 VITALS — BP 132/68 | HR 48 | Ht 62.0 in | Wt 103.0 lb

## 2024-02-12 DIAGNOSIS — F419 Anxiety disorder, unspecified: Secondary | ICD-10-CM

## 2024-02-12 DIAGNOSIS — F1320 Sedative, hypnotic or anxiolytic dependence, uncomplicated: Secondary | ICD-10-CM | POA: Diagnosis not present

## 2024-02-12 DIAGNOSIS — F339 Major depressive disorder, recurrent, unspecified: Secondary | ICD-10-CM

## 2024-02-12 DIAGNOSIS — G43009 Migraine without aura, not intractable, without status migrainosus: Secondary | ICD-10-CM

## 2024-02-12 DIAGNOSIS — M797 Fibromyalgia: Secondary | ICD-10-CM | POA: Diagnosis not present

## 2024-02-12 DIAGNOSIS — T7840XA Allergy, unspecified, initial encounter: Secondary | ICD-10-CM

## 2024-02-12 DIAGNOSIS — D509 Iron deficiency anemia, unspecified: Secondary | ICD-10-CM | POA: Diagnosis not present

## 2024-02-12 MED ORDER — ESCITALOPRAM OXALATE 10 MG PO TABS: 10.0000 mg | ORAL_TABLET | Freq: Every day | ORAL | 1 refills | Status: AC

## 2024-02-12 MED ORDER — SUMATRIPTAN SUCCINATE 50 MG PO TABS: ORAL_TABLET | ORAL | 11 refills | Status: AC

## 2024-02-12 MED ORDER — FLUTICASONE PROPIONATE 50 MCG/ACT NA SUSP: 1.0000 | Freq: Every day | NASAL | 4 refills | Status: AC

## 2024-02-12 MED ORDER — HYDROCODONE-ACETAMINOPHEN 5-325 MG PO TABS: 1.0000 | ORAL_TABLET | Freq: Two times a day (BID) | ORAL | 0 refills | Status: AC | PRN

## 2024-02-12 NOTE — Patient Instructions (Signed)
 Thank you for visiting [facility] today! Here's what we talked about: - Call Sports medicine clinic if you do not hear from them in 2 weeks

## 2024-02-12 NOTE — Progress Notes (Signed)
 Subjective:   This visit was conducted in person. The patient gave informed consent to the use of Abridge AI technology to record the contents of the encounter as documented below.   Patient ID: Lindsey Ward, female    DOB: 11-07-1950, 73 y.o.   MRN: [id number]   Discussed the use of AI scribe software for clinical note transcription with the patient, who gave verbal consent to proceed.  History of Present Illness Lindsey Ward is a 73 year old female with fibromyalgia who presents with chronic pain management issues.  She experiences chronic pain affecting her entire body. The pain can be severe enough to confine her to bed for extended periods, as it did last week. She manages the pain with hydrocodone  (Norco), taken as needed, sometimes up to twice a day, but often only once. Driving exacerbates her pain, necessitating medication upon returning home.  She has previously tried medications such as Cymbalta  and venlafaxine for fibromyalgia but experienced adverse effects including seizure-like activity and jitteriness with Cymbalta , and poor tolerance with venlafaxine. An increased dose of Lexapro to 10 mg daily has not improved her pain.  She experiences itching, which she associates with sugar intake, noting it worsened around Thanksgiving. She also reports hair thinning and breakage, attributing it to potential thyroid  issues, though her thyroid  levels were normal a year ago. Hair loss is noticeable in the shower and on her clothing.  She is currently taking an iron  supplement three times a week for anemia, identified through recent lab work showing low hemoglobin levels.  She experiences migraines infrequently, about every two to three months, and uses sumatriptan  as needed for relief. She is also using Flonase for allergies, which has helped with ear congestion.  She takes montelukast  daily and uses a topical medication for itching. She is on Lexapro for mood, which was recently  increased to 10 mg daily. She also takes Xanax  at night for sleep, using it consistently every night.   Review of Systems  All other systems reviewed and are negative.       Allergies  Allergen Reactions   Aspirin Other (See Comments)    REACTION: GI bleed   Hydralazine Other (See Comments)    hallucinations   Ibuprofen Other (See Comments)    REACTION: GI bleed   Tizanidine Other (See Comments)    throws me in a different world   Buspirone Hcl     REACTION: unspecified   Ciprofloxacin     REACTION: unspecified   Citalopram  Other (See Comments)    Ear ringing.    Codeine    Diazepam    Duloxetine      REACTION: sz like activity   making her feel out of it, jittery and all over the place, has been experiencing diarrhea   Imipramine  Hcl Other (See Comments)    Ear ringing.    Oxycodone-Acetaminophen      REACTION: unspecified   Propoxyphene Hcl    Sulfonamide Derivatives     REACTION: unspecified   Tramadol Hcl Other (See Comments)    REACTION: doesn't remember what happened but couldn't tolerate    Venlafaxine     REACTION: unspecified   Methocarbamol  Rash   Pregabalin Other (See Comments)    REACTION: kept her up and confusion   Risperidone Other (See Comments)    REACTION: confusion   Savella  [Milnacipran  Hcl] Palpitations and Other (See Comments)    Dizziness, Body wouldn't move, SOB, etc    Current Outpatient Medications on File  Prior to Visit  Medication Sig Dispense Refill   ALPRAZolam  (XANAX ) 1 MG tablet Take 1 tablet (1 mg total) by mouth at bedtime. Taking 1     carboxymethylcellulose (REFRESH PLUS) 0.5 % SOLN 1 drop 3 (three) times daily as needed.     hydrocortisone  2.5 % cream Apply topically 2 (two) times daily.     hydrocortisone  2.5 % lotion Apply topically 2 (two) times daily. 118 mL 1   Iron , Ferrous Sulfate , 325 (65 Fe) MG TABS Take 325 mg by mouth 3 (three) times a week. 90 tablet 1   Menthol, Topical Analgesic, (BIOFREEZE EX) Apply 1  application  topically as needed.     montelukast  (SINGULAIR ) 10 MG tablet TAKE 1 TABLET BY MOUTH EVERYDAY AT BEDTIME 90 tablet 3   omeprazole  (PRILOSEC) 20 MG capsule TAKE 1 CAPSULE BY MOUTH EVERY DAY 90 capsule 3   Polyethyl Glycol-Propyl Glycol (SYSTANE OP) Apply to eye as needed.     promethazine  (PHENERGAN ) 12.5 MG tablet Take 1-2 tablets (12.5-25 mg total) by mouth 3 (three) times daily as needed for nausea or vomiting. 60 tablet 0   triamcinolone  cream (KENALOG ) 0.1 % Apply 1 Application topically 2 (two) times daily. 30 g 0   No current facility-administered medications on file prior to visit.    BP 132/68 (BP Location: Right Arm, Patient Position: Sitting, Cuff Size: Normal)   Pulse (!) 48   Ht 5' 2 (1.575 m)   Wt 103 lb (46.7 kg)   SpO2 96%   BMI 18.84 kg/m   Objective:      Physical Exam GENERAL: Alert, cooperative, well developed, no acute distress. HEAD: Normocephalic atraumatic. EYES: Extraocular movements intact BL, pupils round, equal and reactive to light BL, conjunctivae normal BL. EXTREMITIES: No cyanosis or edema. NEUROLOGICAL: Oriented to person, place and time, no gait abnormalities, moves all extremities without gross motor or sensory deficit.         Assessment & Plan:   Assessment & Plan Fibromyalgia Chronic widespread body pains managed with Norco.  Autoimmune workup is negative, low suspicion for myositis, myopathy.  Fibromyalgia is the most likely differential, although, complex regional pain syndrome is a remote possibility as well.  Patient intolerant of Cymbalta  or venlafaxine in the past.  Lexapro increased without improvement.  Will refer to osteopathic manipulation and see whether this improves symptoms and allows us  to scale back on the Norco use.  Another possibility is dry needling through PT if patient is agreeable.  - Continue Norco 5-3 25 mg tablet twice daily as needed - Referred to osteopathic manipulation at [facility]  with Dr. Claudene. - Provided instructions for follow-up if no contact from clinic in two weeks.   Benzodiazepine dependence Dependence on Xanax  for sleep. Discussed risks with Norco and need for safer sleep management. Plan to reduce Xanax  dosage gradually, she currently continues on 1 mg nightly.  - Will reduce Xanax  to 0.5 mg at bedtime when due for refill. - Would consider melatonin versus doxepin as alternative - Scheduled follow-up in two weeks to discuss sleep management and Xanax  reduction.   Major depressive disorder and anxiety disorder Managed with Lexapro, recently increased to 10 mg. Sx controlled. - Continue Lexapro 10 mg daily.   Iron  deficiency anemia Low hemoglobin at 9.9, iron  supplementation initiated. No gastrointestinal bleeding detected. - Continue iron  supplementation three times a week. - Will repeat iron  studies in three months.   Atypical migraine Infrequent migraines managed with sumatriptan  as needed.  Symptoms currently well-controlled. - Continue sumatriptan  as needed for migraines.   Allergies Sent Flonase prescription, due to cost associated with picking up OTC, symptoms well-controlled, no changes  -Continue Flonase 1 spray BL daily   Return in about 2 weeks (around 02/26/2024) for Xanax  and sleep.   Donta Fuster K [place], MD  02/12/24     Contains text generated by Abridge.

## 2024-02-18 ENCOUNTER — Other Ambulatory Visit: Payer: Self-pay

## 2024-02-18 DIAGNOSIS — F1320 Sedative, hypnotic or anxiolytic dependence, uncomplicated: Secondary | ICD-10-CM

## 2024-02-19 NOTE — Telephone Encounter (Signed)
 Name of Medication: Alprazolam  1mg  Name of Pharmacy: [facility] [address] [address] Last [place] or Written Date and Quantity: 12-08-23 Last Office Visit and Type: 02-12-24 Next Office Visit and Type: 10-21-24 Last Controlled Substance Agreement Date: 01-19-24 Last UDS: 01-19-24

## 2024-02-22 ENCOUNTER — Telehealth: Payer: Self-pay

## 2024-02-22 NOTE — Telephone Encounter (Signed)
 Copied from CRM #[id number]. Topic: Clinical - Medication Question >> Feb 22, 2024  3:04 PM Rea ORN wrote: Reason for CRM: Pt stated that ALPRAZolam  (XANAX ) 0.5 MG does not help her sleep at night. Pt asked that if it doesn't work can she take 2 tablets?   Please call back [phone]

## 2024-02-24 ENCOUNTER — Emergency Department: Admission: EM | Admit: 2024-02-24 | Discharge: 2024-02-24 | Disposition: A | Discharge status: Home / Self Care

## 2024-02-24 ENCOUNTER — Emergency Department

## 2024-02-24 ENCOUNTER — Other Ambulatory Visit: Payer: Self-pay

## 2024-02-24 DIAGNOSIS — M2578 Osteophyte, vertebrae: Secondary | ICD-10-CM | POA: Insufficient documentation

## 2024-02-24 DIAGNOSIS — W01190A Fall on same level from slipping, tripping and stumbling with subsequent striking against furniture, initial encounter: Secondary | ICD-10-CM | POA: Diagnosis not present

## 2024-02-24 DIAGNOSIS — S51819A Laceration without foreign body of unspecified forearm, initial encounter: Secondary | ICD-10-CM

## 2024-02-24 DIAGNOSIS — W19XXXA Unspecified fall, initial encounter: Secondary | ICD-10-CM

## 2024-02-24 DIAGNOSIS — S51811A Laceration without foreign body of right forearm, initial encounter: Secondary | ICD-10-CM | POA: Diagnosis present

## 2024-02-24 DIAGNOSIS — S0990XA Unspecified injury of head, initial encounter: Secondary | ICD-10-CM | POA: Diagnosis present

## 2024-02-24 DIAGNOSIS — E785 Hyperlipidemia, unspecified: Secondary | ICD-10-CM | POA: Insufficient documentation

## 2024-02-24 DIAGNOSIS — Y9389 Activity, other specified: Secondary | ICD-10-CM | POA: Diagnosis not present

## 2024-02-24 DIAGNOSIS — M797 Fibromyalgia: Secondary | ICD-10-CM | POA: Insufficient documentation

## 2024-02-24 DIAGNOSIS — S0011XA Contusion of right eyelid and periocular area, initial encounter: Secondary | ICD-10-CM | POA: Diagnosis not present

## 2024-02-24 NOTE — Discharge Instructions (Signed)
 Keep the wound clean, dry, and covered.  See your provider as needed.  Take home meds as prescribed.

## 2024-02-24 NOTE — ED Triage Notes (Signed)
 Pt reports she was trying to take off her boot while in standing position and lost her balance falling forward. Pt hit her right side of forehead against dresser and and skin tear to right forearm. Pt denies passing out. Pt ambulatory to triage with steady gait

## 2024-02-24 NOTE — ED Provider Notes (Signed)
 [facility] Emergency Department Provider Note     Event Date/Time   First MD Initiated Contact with Patient 02/24/24 2241     (approximate)   History   Fall   HPI  Lindsey Ward is a 73 y.o. female with a history of fibromyalgia, chronic pain, HLD, osteopenia, and DDD, presents to the ED for evaluation of injury sustained following mechanical fall.  Patient was apparently standing try to take off her boots, when she lost her balance and fell forward.  She apparently hit her forehead against a dresser and sustained a skin tear to her right forearm.  She denies any LOC, nausea, vomiting, or dizziness.  Patient presents herself via POV to the ED for evaluation of wrist injuries.     Physical Exam   Triage Vital Signs: ED Triage Vitals  Encounter Vitals Group     BP 02/24/24 1900 (!) 126/45     Girls Systolic BP Percentile --      Girls Diastolic BP Percentile --      Boys Systolic BP Percentile --      Boys Diastolic BP Percentile --      Pulse Rate 02/24/24 1900 (!) 55     Resp 02/24/24 1900 16     Temp 02/24/24 1900 97.6 F (36.4 C)     Temp Source 02/24/24 1900 Oral     SpO2 02/24/24 1900 99 %     Weight 02/24/24 1901 103 lb (46.7 kg)     Height 02/24/24 1901 5' 2 (1.575 m)     Head Circumference --      Peak Flow --      Pain Score 02/24/24 1901 0     Pain Loc --      Pain Education --      Exclude from Growth Chart --     Most recent vital signs: Vitals:   02/24/24 1900  BP: (!) 126/45  Pulse: (!) 55  Resp: 16  Temp: 97.6 F (36.4 C)  SpO2: 99%    General Awake, no distress. NAD HEENT NCAT, except for some superficial hematoma to the right lateral brow. PERRL. EOMI. No rhinorrhea. Mucous membranes are moist.  CV:  Good peripheral perfusion.  RRR RESP:  Normal effort.  CTA ABD:  No distention.  Soft and nontender MSK:  AROM of all extremities.  A superficial skin tear to the right forearm.  The skin has been completely  abraded. NEURO: Cranial nerves II to XII grossly intact   ED Results / Procedures / Treatments   Labs (all labs ordered are listed, but only abnormal results are displayed) Labs Reviewed - No data to display   EKG   RADIOLOGY  I personally viewed and evaluated these images as part of my medical decision making, as well as reviewing the written report by the radiologist.  ED Provider Interpretation: No acute CT findings  CT Cervical Spine Wo Contrast Result Date: 02/24/2024 EXAM: CT CERVICAL SPINE WITHOUT CONTRAST 02/24/2024 07:33:00 PM TECHNIQUE: CT of the cervical spine was performed without the administration of intravenous contrast. Multiplanar reformatted images are provided for review. Automated exposure control, iterative reconstruction, and/or weight based adjustment of the mA/kV was utilized to reduce the radiation dose to as low as reasonably achievable. COMPARISON: None available. CLINICAL HISTORY: fall FINDINGS: BONES AND ALIGNMENT: Mild loss of normal cervical lordosis is noted. 7 cervical segments are well visualized. Vertebral body height is well maintained. No acute fracture or acute facet  abnormality is noted. Temporomandibular joint degenerative changes are seen. DEGENERATIVE CHANGES: Mild osteophytic changes and facet hypertrophic changes are noted. SOFT TISSUES: Surrounding soft tissue structures are within normal limits. No focal hematoma is seen. The lung apices are unremarkable. IMPRESSION: 1. No acute fracture or acute facet abnormality. 2. Mild multilevel degenerative changes, including osteophytosis and facet hypertrophy. Electronically signed by: Oneil Devonshire MD 02/24/2024 07:44 PM EST RP Workstation: [id number]   CT Head Wo Contrast Result Date: 02/24/2024 EXAM: CT HEAD WITHOUT 02/24/2024 07:33:00 PM TECHNIQUE: CT of the head was performed without the administration of intravenous contrast. Automated exposure control, iterative reconstruction, and/or weight based  adjustment of the mA/kV was utilized to reduce the radiation dose to as low as reasonably achievable. COMPARISON: None available. CLINICAL HISTORY: Head trauma, minor (Age >= 65y) FINDINGS: BRAIN AND VENTRICLES: No acute intracranial hemorrhage. No mass effect or midline shift. No extra-axial fluid collection. No evidence of acute infarct. Mild age-related cerebral volume loss. ORBITS: No acute abnormality. Bilateral lens replacement. SINUSES AND MASTOIDS: No acute abnormality. SOFT TISSUES AND SKULL: No acute skull fracture. No acute soft tissue abnormality. IMPRESSION: 1. No acute intracranial abnormality related to the head trauma. 2. Mild age-related cerebral volume loss. Electronically signed by: Oneil Devonshire MD 02/24/2024 07:37 PM EST RP Workstation: [id number]     PROCEDURES:  Critical Care performed: No  Procedures Skin tear cleansed with saline Topical tetracaine applied Vaseline gauze applied All-purpose 4 x 4 applied Kling wrap applied Stockinette applied  MEDICATIONS ORDERED IN ED: Medications - No data to display   IMPRESSION / MDM / ASSESSMENT AND PLAN / ED COURSE  I reviewed the triage vital signs and the nursing notes.                              Differential diagnosis includes, but is not limited to, abrasion, laceration, contusion, hematoma, SDH, cervical fracture  Patient's presentation is most consistent with acute complicated illness / injury requiring diagnostic workup.  Patient's diagnosis is consistent with mechanical fall resulting in a minor head contusion and skin tear of the forearm.  Geriatric patient presents with son to the ED for evaluation of her injuries.  No reports of any LOC.  Patient is alert and oriented with no red flags.  CT images interpreted by me, show no acute intracranial process or acute cervical arthropathy.  Patient skin tear superficial without indication for skin repair as the skin overlying has been a completely separated.  Topical  nonstick dressing is applied.  Patient will be discharged home with tractions take OTC Tylenol  or Motrin as needed. Patient is to follow up with her PCP as discussed, as needed or otherwise directed. Patient is given ED precautions to return to the ED for any worsening or new symptoms.    FINAL CLINICAL IMPRESSION(S) / ED DIAGNOSES   Final diagnoses:  Fall in home, initial encounter  Skin tear of forearm without complication, initial encounter     Rx / DC Orders   ED Discharge Orders     None        Note:  This document was prepared using Dragon voice recognition software and may include unintentional dictation errors.    Loyd Candida LULLA Aldona, PA-C 02/24/24 2355    Fernand Rossie HERO, MD 02/28/24 1535

## 2024-02-28 NOTE — Telephone Encounter (Signed)
Called and schedule pt

## 2024-03-05 ENCOUNTER — Ambulatory Visit: Payer: Self-pay

## 2024-03-05 NOTE — Telephone Encounter (Signed)
 Pt also informed that her new insurance card that will be going into effect soon now has / wants her to be assigned to Dr. Glendia at [facility] - I checked and Dr. Glendia is not accepting new pts at present time.

## 2024-03-05 NOTE — Telephone Encounter (Signed)
 FYI Only or Action Required?: FYI only for provider: appointment scheduled on 03/06/2024.  Patient was last seen in primary care on 02/12/2024 by Bennett Reuben POUR, MD.  Called Nurse Triage reporting Fall.  Symptoms began x 2 weeks.  Interventions attempted: Nothing.  Symptoms are: gradually worsening.  Triage Disposition: See Physician Within 24 Hours  Patient/caregiver understands and will follow disposition?: Yes    Copied from CRM #[id number]. Topic: Clinical - Red Word Triage >> Mar 05, 2024 10:28 AM Jayma L wrote: Red Word that prompted transfer to Nurse Triage: fell last week , head is cloudy , and scalp is soft and hurts on right side, eyes hurt and don't feel right.  cut on arm , eye black and hard hurts , down neck and should and its on both sides Reason for Disposition  [1] No prior tetanus shots (or is not fully vaccinated) AND [2] any wound (e.g., cut or scrape)  Answer Assessment - Initial Assessment Questions 1. MECHANISM: How did the fall happen?     Fall 2. DOMESTIC VIOLENCE AND ELDER ABUSE SCREENING: Did you fall because someone pushed you or tried to hurt you? If Yes, ask: Are you safe now?     no 3. ONSET: When did the fall happen? (e.g., minutes, hours, or days ago)     X 2 weeks 4. LOCATION: What part of the body hit the ground? (e.g., back, buttocks, head, hips, knees, hands, head, stomach)     Hit head on hardwood floor (fell face down), right black eye, head cloudy,  5. INJURY: Did you hurt (injure) yourself when you fell? If Yes, ask: What did you injure? Tell me more about this? (e.g., body area; type of injury; pain severity)     Neck bilateral shoulder hurts - right worse than left, cut on right arm 6. PAIN: Is there any pain? If Yes, ask: How bad is the pain? (e.g., Scale 0-10; or none, mild,      Pain- moderate 7. SIZE: For cuts, bruises, or swelling, ask: How large is it? (e.g., inches or centimeters)      na 8. PREGNANCY: Is there  any chance you are pregnant? When was your last menstrual period?     na 9. OTHER SYMPTOMS: Do you have any other symptoms? (e.g., dizziness, fever, weakness; new-onset or worsening).      Weak, tired, dizzy at times 10. CAUSE: What do you think caused the fall (or falling)? (e.g., dizzy spell, tripped)       Just fell  [facility] ER after fall.  Right eye - just does not feel right, vision is not clear in corner, head feels cloudy  Protocols used: Falls and [facility]

## 2024-03-06 ENCOUNTER — Ambulatory Visit

## 2024-03-08 ENCOUNTER — Other Ambulatory Visit: Payer: Self-pay

## 2024-03-08 ENCOUNTER — Emergency Department
Admission: EM | Admit: 2024-03-08 | Discharge: 2024-03-08 | Disposition: A | Discharge status: Home / Self Care | Attending: Emergency Medicine | Admitting: Emergency Medicine

## 2024-03-08 ENCOUNTER — Encounter: Payer: Self-pay | Admitting: Radiology

## 2024-03-08 ENCOUNTER — Emergency Department

## 2024-03-08 DIAGNOSIS — M546 Pain in thoracic spine: Secondary | ICD-10-CM | POA: Insufficient documentation

## 2024-03-08 DIAGNOSIS — R0602 Shortness of breath: Secondary | ICD-10-CM | POA: Diagnosis not present

## 2024-03-08 DIAGNOSIS — M542 Cervicalgia: Secondary | ICD-10-CM | POA: Insufficient documentation

## 2024-03-08 DIAGNOSIS — W19XXXA Unspecified fall, initial encounter: Secondary | ICD-10-CM | POA: Diagnosis not present

## 2024-03-08 DIAGNOSIS — R519 Headache, unspecified: Secondary | ICD-10-CM | POA: Insufficient documentation

## 2024-03-08 LAB — BASIC METABOLIC PANEL WITH GFR
Anion gap: 11 (ref 5–15)
BUN: 20 mg/dL (ref 8–23)
CO2: 25 mmol/L (ref 22–32)
Calcium: 9 mg/dL (ref 8.9–10.3)
Chloride: 103 mmol/L (ref 98–111)
Creatinine, Ser: 0.79 mg/dL (ref 0.44–1.00)
GFR, Estimated: >60 mL/min
Glucose, Bld: 108 mg/dL — ABNORMAL HIGH (ref 70–99)
Potassium: 3.5 mmol/L (ref 3.5–5.1)
Sodium: 138 mmol/L (ref 135–145)

## 2024-03-08 LAB — CBC
HCT: 32.8 % — ABNORMAL LOW (ref 36.0–46.0)
Hemoglobin: 10.9 g/dL — ABNORMAL LOW (ref 12.0–15.0)
MCH: 30.4 pg (ref 26.0–34.0)
MCHC: 33.2 g/dL (ref 30.0–36.0)
MCV: 91.4 fL (ref 80.0–100.0)
Platelets: 332 K/uL (ref 150–400)
RBC: 3.59 MIL/uL — ABNORMAL LOW (ref 3.87–5.11)
RDW: 14 % (ref 11.5–15.5)
WBC: 6.4 K/uL (ref 4.0–10.5)
nRBC: 0 % (ref 0.0–0.2)

## 2024-03-08 LAB — RESP PANEL BY RT-PCR (RSV, FLU A&B, COVID)  RVPGX2
Influenza A by PCR: NEGATIVE
Influenza B by PCR: NEGATIVE
Resp Syncytial Virus by PCR: NEGATIVE
SARS Coronavirus 2 by RT PCR: NEGATIVE

## 2024-03-08 MED ORDER — ACETAMINOPHEN 500 MG PO TABS
500.0000 mg | ORAL_TABLET | Freq: Once | ORAL | Status: AC
  Administered 2024-03-08: 500 mg via ORAL
  Filled 2024-03-08: qty 1

## 2024-03-08 MED ORDER — HYDROCODONE-ACETAMINOPHEN 5-325 MG PO TABS
1.0000 | ORAL_TABLET | Freq: Once | ORAL | Status: AC
  Administered 2024-03-08: 1 via ORAL
  Filled 2024-03-08: qty 1

## 2024-03-08 NOTE — ED Provider Notes (Signed)
 "  [facility] Provider Note    Event Date/Time   First MD Initiated Contact with Patient 03/08/24 1201     (approximate)  History   Chief Complaint: Shortness of Breath and Headache  HPI  Lindsey Ward is a 73 y.o. female with a past medical history of fibromyalgia, gastric reflux, hyperlipidemia, migraines, presents to the emergency department for a worsening right-sided headache continued right sided neck pain and now with some shortness of breath this morning.  According to the patient she had a fall 6 days ago hitting the right side of her head.  Patient was seen in the emergency department at that time had a CT scan of the head showing no acute finding.  Patient states that headache had largely resolved until this morning when the headache returned, she was concerned, was also experiencing some mild shortness of breath so she came to the emergency department for evaluation.  Patient also states she has right sided neck and upper back pain however she states this has been ongoing since the fall 6 days ago no change, somewhat worse with movement.  Patient is prescribed chronic hydrocodone  but did not take it yet this morning.  Patient denies any chest pain.  Physical Exam   Triage Vital Signs: ED Triage Vitals  Encounter Vitals Group     BP 03/08/24 1135 (!) 136/47     Girls Systolic BP Percentile --      Girls Diastolic BP Percentile --      Boys Systolic BP Percentile --      Boys Diastolic BP Percentile --      Pulse Rate 03/08/24 1135 (!) 57     Resp 03/08/24 1135 16     Temp 03/08/24 1135 97.7 F (36.5 C)     Temp Source 03/08/24 1135 Oral     SpO2 03/08/24 1135 95 %     Weight 03/08/24 1131 103 lb (46.7 kg)     Height 03/08/24 1131 5' 2 (1.575 m)     Head Circumference --      Peak Flow --      Pain Score 03/08/24 1219 7     Pain Loc --      Pain Education --      Exclude from Growth Chart --     Most recent vital signs: Vitals:   03/08/24  1135 03/08/24 1158  BP: (!) 136/47   Pulse: (!) 57   Resp: 16   Temp: 97.7 F (36.5 C)   SpO2: 95% 100%    General: Awake, no distress.  CV:  Good peripheral perfusion.  Regular rate and rhythm  Resp:  Normal effort.  Equal breath sounds bilaterally.  Abd:  No distention.  Soft, nontender.  No rebound or guarding.  ED Results / Procedures / Treatments   EKG  EKG viewed and interpreted by myself shows a normal sinus rhythm at 60 bpm with a narrow QRS, normal axis, normal intervals, no concerning ST changes.  RADIOLOGY  I have reviewed and interpreted the chest x-ray images.  No consolidation seen on my evaluation. Radiology has read the x-ray is negative.   MEDICATIONS ORDERED IN ED: Medications  HYDROcodone -acetaminophen  (NORCO/VICODIN) 5-325 MG per tablet 1 tablet (1 tablet Oral Given 03/08/24 1219)  acetaminophen  (TYLENOL ) tablet 500 mg (500 mg Oral Given 03/08/24 1219)     IMPRESSION / MDM / ASSESSMENT AND PLAN / ED COURSE  I reviewed the triage vital signs and the nursing notes.  Patient's presentation is most consistent with acute presentation with potential threat to life or bodily function.  Patient presents to the emergency department for headache this morning with continued right sided neck and back pain along with some shortness of breath.  Patient states she felt like she could not get a full deep breath this morning although states that is somewhat better currently.  Patient has a history of fibromyalgia and has not taken her normal hydrocodone  this morning we will dose it in the emergency department.  Patient's lab work is reassuring with a normal CBC normal chemistry.  Given the shortness of breath sensation this morning we will also obtain a COVID/flu swab.  Chest x-ray is clear lung sounds are reassuring.  Given the return of the patient's right sided headache 1 week after head injury we will repeat a CT scan to rule out a delayed bleed.  Overall the patient  appears well reassuring exam.  Patient's workup is reassuring.  Respiratory panel is negative.  CBC and chemistry are normal.  CT scan the head is negative.  Given the patient's reassuring workup I believe the patient is safe for discharge home with outpatient follow-up.  Patient is agreeable to this plan as well.  FINAL CLINICAL IMPRESSION(S) / ED DIAGNOSES   Headache Dyspnea   Note:  This document was prepared using Dragon voice recognition software and may include unintentional dictation errors.   Dorothyann Drivers, MD 03/08/24 1253  "

## 2024-03-08 NOTE — ED Triage Notes (Signed)
 Pt states that she woke with a severe headache, neck pain, and shortness of breath. She endorses she has had no sick contacts. She states that she feels like she can't take in a good breath. Denies fever. She has no history of pulmonary problems. She endorses the headache is over the right side of her head. Denies falling today but did have a fall on the 13th of this month at which time she did fall and strike that side of her head. She denies use of blood thinners.

## 2024-03-29 ENCOUNTER — Ambulatory Visit

## 2024-04-16 ENCOUNTER — Ambulatory Visit: Payer: Self-pay

## 2024-04-16 NOTE — Telephone Encounter (Signed)
 FYI Only or Action Required?: Action required by provider: update on patient condition and requesting medication increase or alternative.  Patient was last seen in primary care on 02/12/2024 by Bennett Reuben POUR, MD.  Called Nurse Triage reporting Generalized Body Aches.  Symptoms began several weeks ago.  Interventions attempted: Prescription medications: hydrocodone  and Rest, hydration, or home remedies.  Symptoms are: gradually worsening.  Triage Disposition: See HCP Within 4 Hours (Or PCP Triage)  Patient/caregiver understands and will follow disposition?: No, wishes to speak with PCP  Message from [place] S sent at 04/16/2024 11:23 AM EST  Reason for Triage: Patient has difficulty walking. Patient has Fibromyalgia and has pain all over   Reason for Disposition  [1] SEVERE pain (e.g., excruciating, unable to do any normal activities) AND [2] not improved 2 hours after pain medicine  Answer Assessment - Initial Assessment Questions Patient with history of fibromyalgia and is reporting a full body pain flare. Pain generalized and close to 10/10 if not more. Taking hydrocodone -starts with one tablet and does nothing until she takes a 2nd tablet. SOB when up and about due to pain. Resolves at rest. All she can do is scoot around the house.  Denies CP, Fever, Dizziness, URI sx, or recent injury to cause pain. Last fall in December and seen in the ED.   Unable to make it in to an appointment due to pain and not able to drive. Requesting alternative or stronger medication to help with pain. Pharmacy confirmed. Patient requesting CB with response  1. ONSET: When did the muscle aches or body pains start?      Down all of January and Feb is worse 2. LOCATION: What part of your body is hurting? (e.g., entire body, arms, legs)      Whole body  3. SEVERITY: How bad is the pain? (Scale 1-10; or mild, moderate, severe)     Close to 10/10  4. CAUSE: What do you think is causing the pains?      Fibromyalgia  5. FEVER: Do you have a fever? If Yes, ask: What is your temperature, how was it measured, and  when did it start?      Denies  6. OTHER SYMPTOMS: Do you have any other symptoms? (e.g., chest pain, cold or flu symptoms, rash, weakness, weight loss)     SOB with activity from pain  Protocols used: Muscle Aches and Body Pain-A-AH

## 2024-04-17 ENCOUNTER — Other Ambulatory Visit: Payer: Self-pay

## 2024-04-17 DIAGNOSIS — F1320 Sedative, hypnotic or anxiolytic dependence, uncomplicated: Secondary | ICD-10-CM

## 2024-04-17 NOTE — Telephone Encounter (Signed)
 Addressed via MyChart.

## 2024-04-18 ENCOUNTER — Telehealth

## 2024-04-18 NOTE — Telephone Encounter (Signed)
 Unable to leave msg on patient phone, Left message with daughter to get her a video visit scheduled

## 2024-04-18 NOTE — Telephone Encounter (Signed)
 Called and schedule pt for virtual visit

## 2024-07-18 ENCOUNTER — Ambulatory Visit

## 2024-07-19 ENCOUNTER — Ambulatory Visit

## 2024-10-21 ENCOUNTER — Encounter
